# Patient Record
Sex: Female | Born: 1950 | Race: Black or African American | Hispanic: No | Marital: Married
Health system: Midwestern US, Community
[De-identification: ages and names within clinical notes are randomized; demographics above are authoritative.]

## PROBLEM LIST (undated history)

## (undated) DIAGNOSIS — D496 Neoplasm of unspecified behavior of brain: Secondary | ICD-10-CM

## (undated) DIAGNOSIS — E7800 Pure hypercholesterolemia, unspecified: Secondary | ICD-10-CM

## (undated) DIAGNOSIS — R931 Abnormal findings on diagnostic imaging of heart and coronary circulation: Secondary | ICD-10-CM

## (undated) DIAGNOSIS — J40 Bronchitis, not specified as acute or chronic: Secondary | ICD-10-CM

## (undated) DIAGNOSIS — R0989 Other specified symptoms and signs involving the circulatory and respiratory systems: Secondary | ICD-10-CM

## (undated) DIAGNOSIS — I509 Heart failure, unspecified: Secondary | ICD-10-CM

## (undated) DIAGNOSIS — H4720 Unspecified optic atrophy: Secondary | ICD-10-CM

## (undated) DIAGNOSIS — I10 Essential (primary) hypertension: Secondary | ICD-10-CM

## (undated) DIAGNOSIS — H47293 Other optic atrophy, bilateral: Secondary | ICD-10-CM

## (undated) DIAGNOSIS — K253 Acute gastric ulcer without hemorrhage or perforation: Secondary | ICD-10-CM

## (undated) DIAGNOSIS — Z1231 Encounter for screening mammogram for malignant neoplasm of breast: Secondary | ICD-10-CM

## (undated) DIAGNOSIS — D649 Anemia, unspecified: Secondary | ICD-10-CM

## (undated) DIAGNOSIS — M7122 Synovial cyst of popliteal space [Baker], left knee: Secondary | ICD-10-CM

## (undated) DIAGNOSIS — E041 Nontoxic single thyroid nodule: Secondary | ICD-10-CM

## (undated) DIAGNOSIS — R062 Wheezing: Secondary | ICD-10-CM

## (undated) DIAGNOSIS — R921 Mammographic calcification found on diagnostic imaging of breast: Secondary | ICD-10-CM

## (undated) DIAGNOSIS — R7401 Elevation of levels of liver transaminase levels: Secondary | ICD-10-CM

## (undated) DIAGNOSIS — M810 Age-related osteoporosis without current pathological fracture: Secondary | ICD-10-CM

## (undated) DIAGNOSIS — I5022 Chronic systolic (congestive) heart failure: Secondary | ICD-10-CM

## (undated) DIAGNOSIS — E042 Nontoxic multinodular goiter: Secondary | ICD-10-CM

## (undated) DIAGNOSIS — R1310 Dysphagia, unspecified: Secondary | ICD-10-CM

## (undated) DIAGNOSIS — R933 Abnormal findings on diagnostic imaging of other parts of digestive tract: Secondary | ICD-10-CM

## (undated) DIAGNOSIS — K222 Esophageal obstruction: Secondary | ICD-10-CM

## (undated) DIAGNOSIS — R448 Other symptoms and signs involving general sensations and perceptions: Secondary | ICD-10-CM

## (undated) DIAGNOSIS — K2950 Unspecified chronic gastritis without bleeding: Secondary | ICD-10-CM

## (undated) DIAGNOSIS — M79673 Pain in unspecified foot: Secondary | ICD-10-CM

## (undated) DIAGNOSIS — R059 Cough, unspecified: Secondary | ICD-10-CM

## (undated) HISTORY — PX: CHOLECYSTECTOMY: SHX55

## (undated) MED ORDER — PHENYTOIN SODIUM EXTENDED 100 MG CAP
100 mg | ORAL_CAPSULE | Freq: Three times a day (TID) | ORAL | Status: DC
Start: ? — End: 2012-01-12

## (undated) MED ORDER — CRESTOR 5 MG TABLET
5 mg | ORAL_TABLET | ORAL | Status: DC
Start: ? — End: 2013-07-29

## (undated) MED ORDER — CARVEDILOL 12.5 MG TAB
12.5 mg | ORAL_TABLET | ORAL | Status: DC
Start: ? — End: 2013-04-11

## (undated) MED ORDER — CARVEDILOL 12.5 MG TAB
12.5 mg | ORAL_TABLET | ORAL | Status: DC
Start: ? — End: 2012-09-19

## (undated) MED ORDER — PHENYTOIN SODIUM EXTENDED 100 MG CAP
100 mg | ORAL_CAPSULE | ORAL | Status: DC
Start: ? — End: 2012-12-13

## (undated) MED ORDER — ZOLPIDEM 5 MG TAB
5 mg | ORAL_TABLET | Freq: Every evening | ORAL | Status: DC | PRN
Start: ? — End: 2012-01-19

## (undated) MED ORDER — CRESTOR 5 MG TABLET
5 mg | ORAL_TABLET | ORAL | Status: DC
Start: ? — End: 2012-12-27

## (undated) MED ORDER — TRIMETHOPRIM-SULFAMETHOXAZOLE 160 MG-800 MG TAB
160-800 mg | ORAL_TABLET | Freq: Two times a day (BID) | ORAL | Status: AC
Start: ? — End: 2011-11-22

## (undated) MED ORDER — HYDROCODONE 10 MG-CHLORPHENIRAMINE 8 MG/5 ML ORAL SUSP EXTEND.REL 12HR
10-8 mg/5 mL | Freq: Two times a day (BID) | ORAL | Status: DC | PRN
Start: ? — End: 2012-06-10

## (undated) MED ORDER — CODEINE-GUAIFENESIN 10 MG-100 MG/5 ML ORAL LIQUID
100-10 mg/5 mL | Freq: Four times a day (QID) | ORAL | Status: DC | PRN
Start: ? — End: 2012-04-01

## (undated) MED ORDER — MONTELUKAST 10 MG TAB
10 mg | ORAL_TABLET | Freq: Every evening | ORAL | Status: DC
Start: ? — End: 2013-04-03

## (undated) MED ORDER — CRESTOR 5 MG TABLET
5 mg | ORAL_TABLET | ORAL | Status: DC
Start: ? — End: 2012-09-19

## (undated) MED ORDER — HYDROCODONE-ACETAMINOPHEN 7.5 MG-325 MG TAB
ORAL_TABLET | Freq: Four times a day (QID) | ORAL | Status: DC | PRN
Start: ? — End: 2013-01-14

## (undated) MED ORDER — HYDROCODONE-ACETAMINOPHEN 7.5 MG-325 MG TAB
ORAL_TABLET | Freq: Four times a day (QID) | ORAL | Status: DC | PRN
Start: ? — End: 2012-01-19

## (undated) MED ORDER — AZITHROMYCIN 250 MG TAB
250 mg | ORAL_TABLET | ORAL | Status: DC
Start: ? — End: 2012-06-10

## (undated) MED ORDER — DEXAMETHASONE 2 MG TAB
2 mg | ORAL_TABLET | ORAL | Status: DC
Start: ? — End: 2011-12-12

## (undated) MED ORDER — ZOLPIDEM 5 MG TAB
5 mg | ORAL_TABLET | ORAL | Status: DC
Start: ? — End: 2013-09-02

## (undated) MED ORDER — HYDROCODONE-ACETAMINOPHEN 7.5 MG-325 MG TAB
ORAL_TABLET | Freq: Four times a day (QID) | ORAL | Status: DC | PRN
Start: ? — End: 2012-03-05

## (undated) MED ORDER — FUROSEMIDE 40 MG TAB
40 mg | ORAL_TABLET | Freq: Every day | ORAL | Status: DC
Start: ? — End: 2013-04-11

## (undated) MED ORDER — ZOLPIDEM 5 MG TAB
5 mg | ORAL_TABLET | ORAL | Status: DC
Start: ? — End: 2013-01-25

## (undated) MED ORDER — FLUTICASONE 50 MCG/ACTUATION NASAL SPRAY, SUSP
50 mcg/actuation | Freq: Every day | NASAL | Status: DC
Start: ? — End: 2013-11-08

## (undated) MED ORDER — ZOLPIDEM 5 MG TAB
5 mg | ORAL_TABLET | Freq: Every evening | ORAL | Status: DC | PRN
Start: ? — End: 2012-07-03

## (undated) MED ORDER — TIZANIDINE 4 MG TAB
4 mg | ORAL_TABLET | Freq: Two times a day (BID) | ORAL | Status: DC
Start: ? — End: 2012-12-13

## (undated) MED ORDER — TRAMADOL 50 MG TAB
50 mg | ORAL_TABLET | Freq: Four times a day (QID) | ORAL | Status: DC | PRN
Start: ? — End: 2011-12-12

---

## 2004-02-26 LAB — HM MAMMOGRAPHY

## 2006-09-25 LAB — HM PAP SMEAR

## 2008-01-27 LAB — CBC WITH AUTOMATED DIFF
ABS. EOSINOPHILS: 0 10*3/uL (ref 0.0–0.4)
ABS. LYMPHOCYTES: 1.3 10*3/uL (ref 0.8–3.5)
ABS. MONOCYTES: 0.4 10*3/uL (ref 0–1.0)
ABS. NEUTROPHILS: 2.3 10*3/uL (ref 1.8–8.0)
BASOPHILS: 0 % (ref 0–3)
EOSINOPHILS: 1 % (ref 0–5)
HCT: 37.6 % (ref 36.0–46.0)
HGB: 12.9 g/dL (ref 12.0–16.0)
LYMPHOCYTES: 33 % (ref 20–51)
MCH: 31.9 PG (ref 25.0–35.0)
MCHC: 34.2 g/dL (ref 31.0–37.0)
MCV: 93.2 FL (ref 78.0–102.0)
MONOCYTES: 9 % (ref 2–9)
MPV: 9.8 FL (ref 7.4–10.4)
NEUTROPHILS: 57 % (ref 42–75)
PLATELET: 228 10*3/uL (ref 130–400)
RBC: 4.03 M/uL — ABNORMAL LOW (ref 4.10–5.10)
RDW: 13.6 % (ref 11.5–14.5)
WBC: 4 10*3/uL — ABNORMAL LOW (ref 4.5–13.0)

## 2008-01-27 LAB — METABOLIC PANEL, COMPREHENSIVE
A-G Ratio: 1.1 (ref 0.8–1.7)
ALT (SGPT): 38 U/L (ref 30–65)
AST (SGOT): 11 U/L — ABNORMAL LOW (ref 15–37)
Albumin: 3.9 g/dL (ref 3.4–5.0)
Alk. phosphatase: 125 U/L (ref 50–136)
Anion gap: 6 mmol/L (ref 5–15)
BUN/Creatinine ratio: 27 — ABNORMAL HIGH (ref 12–20)
BUN: 19 MG/DL — ABNORMAL HIGH (ref 7–18)
Bilirubin, total: 0.4 MG/DL (ref 0.1–0.9)
CO2: 31 MMOL/L (ref 21–32)
Calcium: 9.2 MG/DL (ref 8.4–10.4)
Chloride: 106 MMOL/L (ref 100–108)
Creatinine: 0.7 MG/DL (ref 0.6–1.3)
GFR est AA: >60 mL/min/{1.73_m2} (ref >60)
GFR est non-AA: >60 mL/min/{1.73_m2} (ref >60)
Globulin: 3.4 g/dL (ref 2.0–4.0)
Glucose: 90 MG/DL (ref 74–99)
Potassium: 4.6 MMOL/L (ref 3.5–5.5)
Protein, total: 7.3 g/dL (ref 6.4–8.2)
Sodium: 143 MMOL/L (ref 136–145)

## 2008-01-27 LAB — LIPID PANEL
CHOL/HDL Ratio: 3.7 (ref 0–5.0)
Cholesterol, total: 255 MG/DL — ABNORMAL HIGH (ref 0–200)
HDL Cholesterol: 69 MG/DL — ABNORMAL HIGH (ref 40–60)
LDL, calculated: 178.4 MG/DL — ABNORMAL HIGH (ref 0–100)
Triglyceride: 38 MG/DL (ref 0–150)
VLDL, calculated: 7.6 MG/DL

## 2008-01-27 LAB — TSH 3RD GENERATION: TSH: 1.39 u[IU]/mL (ref 0.51–6.27)

## 2008-01-27 LAB — T3, FREE: Triiodothyronine (T3), free: 3.3 PG/ML (ref 2.3–4.2)

## 2008-01-27 LAB — T4, FREE: T4, Free: 0.8 NG/DL — ABNORMAL LOW (ref 0.89–1.76)

## 2008-04-26 LAB — TSH 3RD GENERATION: TSH: 1.78 u[IU]/mL (ref 0.51–6.27)

## 2008-04-26 LAB — T4, FREE: T4, Free: 0.8 NG/DL — ABNORMAL LOW (ref 0.89–1.76)

## 2008-04-27 LAB — THYROID PEROXIDASE (TPO) AB: Thyroid peroxidase Ab: 0.5 IU/mL (ref 0.0–3.9)

## 2008-05-13 LAB — LIPID PANEL
CHOL/HDL Ratio: 4.3 (ref 0–5.0)
Cholesterol, total: 248 MG/DL — ABNORMAL HIGH (ref 0–200)
HDL Cholesterol: 58 MG/DL (ref 40–60)
LDL, calculated: 176 MG/DL — ABNORMAL HIGH (ref 0–100)
Triglyceride: 70 MG/DL (ref 0–150)
VLDL, calculated: 14 MG/DL

## 2008-05-13 LAB — CBC WITH AUTOMATED DIFF
ABS. EOSINOPHILS: 0 10*3/uL (ref 0.0–0.4)
ABS. LYMPHOCYTES: 1.5 10*3/uL (ref 0.8–3.5)
ABS. MONOCYTES: 0.4 10*3/uL (ref 0–1.0)
ABS. NEUTROPHILS: 2.1 10*3/uL (ref 1.8–8.0)
BASOPHILS: 1 % (ref 0–3)
EOSINOPHILS: 1 % (ref 0–5)
HCT: 36.3 % (ref 36.0–46.0)
HGB: 12.3 g/dL (ref 12.0–16.0)
LYMPHOCYTES: 37 % (ref 20–51)
MCH: 31.4 PG (ref 25.0–35.0)
MCHC: 33.9 g/dL (ref 31.0–37.0)
MCV: 92.7 FL (ref 78.0–102.0)
MONOCYTES: 9 % (ref 2–9)
MPV: 9.4 FL (ref 7.4–10.4)
NEUTROPHILS: 52 % (ref 42–75)
PLATELET: 223 10*3/uL (ref 130–400)
RBC: 3.92 M/uL — ABNORMAL LOW (ref 4.10–5.10)
RDW: 14.2 % (ref 11.5–14.5)
WBC: 4.1 10*3/uL — ABNORMAL LOW (ref 4.5–13.0)

## 2008-05-13 LAB — METABOLIC PANEL, COMPREHENSIVE
A-G Ratio: 1.2 (ref 0.8–1.7)
ALT (SGPT): 36 U/L (ref 30–65)
AST (SGOT): 11 U/L — ABNORMAL LOW (ref 15–37)
Albumin: 3.9 g/dL (ref 3.4–5.0)
Alk. phosphatase: 123 U/L (ref 50–136)
Anion gap: 5 mmol/L (ref 5–15)
BUN/Creatinine ratio: 20 (ref 12–20)
BUN: 14 MG/DL (ref 7–18)
Bilirubin, total: 0.5 MG/DL (ref 0.1–0.9)
CO2: 33 MMOL/L — ABNORMAL HIGH (ref 21–32)
Calcium: 8.9 MG/DL (ref 8.4–10.4)
Chloride: 106 MMOL/L (ref 100–108)
Creatinine: 0.7 MG/DL (ref 0.6–1.3)
GFR est AA: >60 mL/min/{1.73_m2} (ref >60)
GFR est non-AA: >60 mL/min/{1.73_m2} (ref >60)
Globulin: 3.3 g/dL (ref 2.0–4.0)
Glucose: 84 MG/DL (ref 74–99)
Potassium: 4.2 MMOL/L (ref 3.5–5.5)
Protein, total: 7.2 g/dL (ref 6.4–8.2)
Sodium: 144 MMOL/L (ref 136–145)

## 2008-06-05 LAB — CBC WITH AUTOMATED DIFF
ABS. EOSINOPHILS: 0 10*3/uL (ref 0.0–0.4)
ABS. LYMPHOCYTES: 1.1 10*3/uL (ref 0.8–3.5)
ABS. MONOCYTES: 0.4 10*3/uL (ref 0–1.0)
ABS. NEUTROPHILS: 2.8 10*3/uL (ref 1.8–8.0)
BASOPHILS: 1 % (ref 0–3)
EOSINOPHILS: 1 % (ref 0–5)
HCT: 38.1 % (ref 36.0–46.0)
HGB: 13.1 g/dL (ref 12.0–16.0)
LYMPHOCYTES: 26 % (ref 20–51)
MCH: 31.8 PG (ref 25.0–35.0)
MCHC: 34.4 g/dL (ref 31.0–37.0)
MCV: 92.4 FL (ref 78.0–102.0)
MONOCYTES: 10 % — ABNORMAL HIGH (ref 2–9)
MPV: 9 FL (ref 7.4–10.4)
NEUTROPHILS: 62 % (ref 42–75)
PLATELET: 223 10*3/uL (ref 130–400)
RBC: 4.12 M/uL (ref 4.10–5.10)
RDW: 14.2 % (ref 11.5–14.5)
WBC: 4.4 10*3/uL — ABNORMAL LOW (ref 4.5–13.0)

## 2008-06-05 LAB — CKMB PROFILE
CK - MB: <0.5 ng/ml — ABNORMAL LOW (ref 0.5–3.6)
CK: 34 U/L (ref 21–215)

## 2008-06-05 LAB — TROPONIN I: Troponin-I, QT: <0.04 NG/ML (ref 0.00–0.08)

## 2008-06-05 LAB — METABOLIC PANEL, BASIC
Anion gap: 4 mmol/L — ABNORMAL LOW (ref 5–15)
BUN/Creatinine ratio: 19 (ref 12–20)
BUN: 13 MG/DL (ref 7–18)
CO2: 30 MMOL/L (ref 21–32)
Calcium: 9.2 MG/DL (ref 8.4–10.4)
Chloride: 105 MMOL/L (ref 100–108)
Creatinine: 0.7 MG/DL (ref 0.6–1.3)
GFR est AA: >60 mL/min/{1.73_m2} (ref >60)
GFR est non-AA: >60 mL/min/{1.73_m2} (ref >60)
Glucose: 85 MG/DL (ref 74–99)
Potassium: 3.9 MMOL/L (ref 3.5–5.5)
Sodium: 139 MMOL/L (ref 136–145)

## 2008-06-05 LAB — POC TROPONIN-I: Troponin-I (POC): <0.04 ng/mL (ref 0.00–0.08)

## 2008-06-05 LAB — NT-PRO BNP: NT pro-BNP: 678 PG/ML (ref 0–900)

## 2008-06-07 LAB — URINALYSIS W/ RFLX MICROSCOPIC
Bilirubin: NEGATIVE
Glucose: NEGATIVE MG/DL
Ketone: NEGATIVE MG/DL
Leukocyte Esterase: NEGATIVE
Nitrites: NEGATIVE
Protein: NEGATIVE MG/DL
Specific gravity: 1.015 (ref 1.003–1.030)
Urobilinogen: 0.2 EU/DL (ref 0.2–1.0)
pH (UA): 6.5 (ref 5.0–8.0)

## 2008-06-07 LAB — METABOLIC PANEL, BASIC
Anion gap: 5 mmol/L (ref 5–15)
BUN/Creatinine ratio: 21 — ABNORMAL HIGH (ref 12–20)
BUN: 15 MG/DL (ref 7–18)
CO2: 33 MMOL/L — ABNORMAL HIGH (ref 21–32)
Calcium: 9.2 MG/DL (ref 8.4–10.4)
Chloride: 106 MMOL/L (ref 100–108)
Creatinine: 0.7 MG/DL (ref 0.6–1.3)
GFR est AA: >60 mL/min/{1.73_m2} (ref >60)
GFR est non-AA: >60 mL/min/{1.73_m2} (ref >60)
Glucose: 85 MG/DL (ref 74–99)
Potassium: 4.5 MMOL/L (ref 3.5–5.5)
Sodium: 144 MMOL/L (ref 136–145)

## 2008-06-07 LAB — FACTOR V LEIDEN

## 2008-06-07 LAB — LIPID PANEL
CHOL/HDL Ratio: 3.5 (ref 0–5.0)
Cholesterol, total: 196 MG/DL (ref 0–200)
HDL Cholesterol: 56 MG/DL (ref 40–60)
LDL, calculated: 130.6 MG/DL — ABNORMAL HIGH (ref 0–100)
Triglyceride: 47 MG/DL (ref 0–150)
VLDL, calculated: 9.4 MG/DL

## 2008-06-07 LAB — CBC WITH AUTOMATED DIFF
ABS. EOSINOPHILS: 0 10*3/uL (ref 0.0–0.4)
ABS. LYMPHOCYTES: 1.1 10*3/uL (ref 0.8–3.5)
ABS. MONOCYTES: 0.3 10*3/uL (ref 0–1.0)
ABS. NEUTROPHILS: 2.1 10*3/uL (ref 1.8–8.0)
BASOPHILS: 1 % (ref 0–3)
EOSINOPHILS: 1 % (ref 0–5)
HCT: 37.4 % (ref 36.0–46.0)
HGB: 12.8 g/dL (ref 12.0–16.0)
LYMPHOCYTES: 32 % (ref 20–51)
MCH: 31.6 PG (ref 25.0–35.0)
MCHC: 34.2 g/dL (ref 31.0–37.0)
MCV: 92.3 FL (ref 78.0–102.0)
MONOCYTES: 8 % (ref 2–9)
MPV: 9.6 FL (ref 7.4–10.4)
NEUTROPHILS: 58 % (ref 42–75)
PLATELET: 237 10*3/uL (ref 130–400)
RBC: 4.06 M/uL — ABNORMAL LOW (ref 4.10–5.10)
RDW: 14.4 % (ref 11.5–14.5)
WBC: 3.6 10*3/uL — ABNORMAL LOW (ref 4.5–13.0)

## 2008-06-07 LAB — CK: CK: 30 U/L (ref 21–215)

## 2008-06-07 LAB — PTT: aPTT: 32.4 s (ref 24.6–37.7)

## 2008-06-07 LAB — HEPATIC FUNCTION PANEL
A-G Ratio: 1.3 (ref 0.8–1.7)
ALT (SGPT): 38 U/L (ref 30–65)
AST (SGOT): 10 U/L — ABNORMAL LOW (ref 15–37)
Albumin: 4 g/dL (ref 3.4–5.0)
Alk. phosphatase: 138 U/L — ABNORMAL HIGH (ref 50–136)
Bilirubin, direct: 0.1 MG/DL (ref 0.0–0.3)
Bilirubin, total: 0.6 MG/DL (ref 0.1–0.9)
Globulin: 3.2 g/dL (ref 2.0–4.0)
Protein, total: 7.2 g/dL (ref 6.4–8.2)

## 2008-06-07 LAB — TSH 3RD GENERATION: TSH: 0.66 u[IU]/mL (ref 0.51–6.27)

## 2008-06-07 LAB — PROTHROMBIN TIME + INR
INR: 1 (ref 0.0–1.2)
Prothrombin time: 13.3 SECS (ref 11.5–15.2)

## 2008-06-07 LAB — URINE MICROSCOPIC ONLY
Hyaline cast: 0 (ref 0–2)
RBC: 4 /HPF (ref 0–5)
WBC: 0 /HPF (ref 0–4)

## 2008-06-08 LAB — SED RATE (ESR): Sed rate (ESR): 30 MM/HR (ref 0–30)

## 2008-06-09 LAB — CRP, HIGH SENSITIVITY: CRP, High sensitivity: 0.77 mg/L (ref <=3.00)

## 2008-06-12 LAB — FACTOR V LEIDEN
FACTOR V LEIDEN,FVL: NEGATIVE
Factor V Leiden-PCR: NEGATIVE

## 2008-06-23 DIAGNOSIS — E039 Hypothyroidism, unspecified: Secondary | ICD-10-CM | POA: Insufficient documentation

## 2008-07-19 MED ORDER — FLUTICASONE 50 MCG/ACTUATION NASAL SPRAY, SUSP
50 mcg/actuation | Freq: Every day | NASAL | Status: DC
Start: 2008-07-19 — End: 2009-06-06

## 2008-07-19 NOTE — Progress Notes (Signed)
HISTORY OF PRESENT ILLNESS  Cathy [name] is a 58 y.o. female.  HPI  Seen by Dr [name].  Has been dx with gallstones.  Was treated with meds.  Has been advised to see a Careers adviser.  Pt has not gone yet, wanted to discuss it further with PCP.  No longer having abdominal pain.    Pt admits that she gets easily fatigued with exertion lately.  Saw cards, was told that no new problems existed.  Was told to hold lisinopril and coreg for one week.  Had elevation of bp so coreg was resumed.    Pt reports that her husband complains that she is not breathing normally while sleeping.  Has a "rattling" noise that he hears while she is asleep.  Does not think that she is snoring.  Pt does not think that she stops breathing although he does wake her up sometimes to see if she is ok.  Pt hears it as well when she first gets up in the am.  Resolves after she has been up for a while.    ROS    Physical Exam    ASSESSMENT and PLAN  Cathy [name] was seen today for labs, abdominal pain and breathing problem.    Diagnoses and associated orders for this visit:    Gallstones  rec pt see surg for further eval.    Htn (hypertension)  Stable.    Allergic rhinitis, cause unspecified  - fluticasone (FLONASE) 50 mcg/act. nasal spray; 2 Sprays by Nasal route daily.    Noisy breathing at night:  Refer to pulm for eval    History of elevated lipids  Cont zocor.      Follow-up Disposition:  Return in about 1 month (around 08/19/2008).

## 2008-07-19 NOTE — Progress Notes (Signed)
Patient here to review her labs, abdominal pain due to gallstones, breathing problem at night during sleep sounds like rattling,

## 2008-07-19 NOTE — Patient Instructions (Signed)
Contact a surgeon for evaluation of gallstones.  Contact a pulmonologist for pulmonary function testing to evaluate fatigue/ shortness of breath with exertion.

## 2008-07-26 NOTE — Telephone Encounter (Signed)
Discussed with Patient some mild abnormalities on blood work and urine analysis (alkaline phosphatase and blood in urine) . She has a follow up appointment with Dr. Luiz Blare and will forward message to Dr. Luiz Blare to discuss those labs with patient.

## 2008-08-23 LAB — AMB POC URINALYSIS DIP STICK MANUAL W/O MICRO
Bilirubin (UA POC): NEGATIVE
Glucose (UA POC): NEGATIVE
Ketones (UA POC): NEGATIVE
Leukocyte esterase (UA POC): NEGATIVE
Nitrites (UA POC): NEGATIVE
Specific gravity (UA POC): 1.02 (ref 1.001–1.035)
Urobilinogen: 0.2
pH (UA POC): 7 (ref 4.6–8.0)

## 2008-08-23 NOTE — Patient Instructions (Signed)
Please remember to fast for your labs.  Finish your dinner by 8pm.  Feel free to drink as much water as you would like.  It is also ok to take any vitamins or medications you would normally take.  Do your bloodwork as early as possible the next morning before having any coffee, tea, juice, or food.

## 2008-08-23 NOTE — Progress Notes (Signed)
HISTORY OF PRESENT ILLNESS  Cathy [name] is a 58 y.o. female.  HPI  Saw pulmonologist about noisy breathing at night.  A breathing test was done in the office.  Was told that the result was normal.  Told that she is tired because she is not sleeping well due to altered breathing at night.  Has sleep study pending in July at [facility].  Husband has noted that she is less noisy when she uses flonase before bedtime.        Review of Systems   Constitutional: Negative.    HENT: Negative.    Respiratory: Negative.    Cardiovascular: Negative.    Musculoskeletal: Negative.    Neurological: Negative.    Psychiatric/Behavioral: Negative.        Physical Exam   Constitutional: She is oriented to person, place, and time. She appears well-developed and well-nourished.   HENT:   Head: Normocephalic and atraumatic.   Right Ear: External ear normal.   Left Ear: External ear normal.   Nose: Nose normal.   Eyes: Conjunctivae and extraocular motions are normal.   Neck: Trachea normal and normal range of motion. Neck supple. Carotid bruit is not present.   Cardiovascular: Normal rate, regular rhythm, normal heart sounds and normal pulses.    Pulmonary/Chest: Effort normal and breath sounds normal.   Musculoskeletal: Normal range of motion.   Neurological: She is alert and oriented to person, place, and time. Coordination and gait normal.   Skin: Skin is warm, dry and intact.   Psychiatric: She has a normal mood and affect. Her speech is normal and behavior is normal. Judgment and thought content normal. Cognition and memory are normal.       ASSESSMENT and PLAN  Cathy [name] was seen today for hypertension, cholesterol problem and heart problem.    Diagnoses and associated orders for this visit:    Hematuria, microscopic  - AMB POC URINALYSIS DIP STICK MANUAL W/O MICRO  - CULTURE, URINE; Future  Pt notified of persistent positive result.  No sign uti.  Will await UCx results.  If wnl, refer to uro for workup.                  Htn (hypertension)  - METABOLIC PANEL, COMPREHENSIVE; Future    History of elevated lipids  - LIPID PANEL; Future        Follow-up Disposition:  Return in about 3 months (around 11/23/2008) for follow up bp, cholesterol, labs.

## 2008-08-23 NOTE — Progress Notes (Signed)
Pt is here for f/u with labs

## 2008-08-25 LAB — CULTURE, URINE
Culture result:: NO GROWTH
Culture: NO GROWTH

## 2008-09-29 NOTE — Telephone Encounter (Signed)
09/29/08 patient states she notices blood in her vagina when she wipes after urinating and notices a dark orange color to her urine in the toilet bowl.  Per Dr. Luiz Blare directions the patient will be set up as soon as possible to see Dr. Clent Ridges Uro/GYN and the patient understands I will get back with her as soon as I set the appointment up with Dr. Clent Ridges.

## 2008-09-29 NOTE — Telephone Encounter (Signed)
Message copied by Christen Butter on Fri Sep 29, 2008 11:49 AM  ------       Message from: Marice Potter       Created: Fri Sep 29, 2008 10:16 AM       Regarding: graves       Contact: [phone]         Cathy [name] would like you to return her call.  She said you called her and said you were going to send her somewhere for blood in her urine, but never got back in touch with her.  She is starting to see it again and would like you to please return her call.

## 2008-09-29 NOTE — Telephone Encounter (Addendum)
09/29/08 patient informed per Dr. Luiz Blare that Dr. Rushie Nyhan URO/GYN at [phone]  Fax# [phone] their office will contact our patient by closing Monday, 10/02/08 to make an appointment to see Dr. Jennye Boroughs will call us back later if notes are needed).  Patient states she understands all.

## 2008-11-01 NOTE — Procedures (Signed)
SLEEP DISORDER CENTER   [facility] POLYSOMNOGRAM REPORT   [address] [place], [place]   [address]   Telephone [phone] Fax [phone]     SLEEP DISORDER CENTER   POLYSOMNOGRAM REPORT    PATIENT: Cathy [name], Cathy [name]  MRN: [id number] DATE: 10/19/2008  BILLING: [id number] LOCATION:  REFERRING:  DICTATING: Zollie Pee, MD      STUDY TYPE: CPAP TITRATION STUDY    REFERRING PHYSICIAN: Mellody Drown, MD    INTERPRETING PHYSICIAN: Zollie Pee, MD    STUDY START TIME: 2310 p.m.    STUDY END TIME: 0611 a.m.    CLINICAL INFORMATION: This is a 58 year old female who had obstructive  sleep apnea syndrome, excessive daytime sleepiness and unrefreshed sleep.  The patient is 67 inches tall and weighs 205 pounds.    STUDY QUESTIONNAIRE: According to her own estimate, the patient took 10  minutes to fall asleep. Slept for 7 hours, woke up 3 times and remained  awake for 3 minutes during the whole study. She felt she got too little  sleep and sleep was very little refreshing at the end of the study. Patient  had no caffeinated or alcoholic beverages or her medications prior to the  study.    STUDY PROTOCOL: This study consisted of overnight recording of 14 channels  including left and right EOG; EEG; submental and extremity EMG; EKG;  airflow measurements, respiratory effort and oximetry.    STUDY RESULTS: During the CPAP titration electrophysiological measures  show total recording time (TRT) 421 minutes, total sleep time (TST) 395  minutes, with normal sleep efficiency of 94% and slightly low sleep latency  (SL) of 8 minutes. The patient remained wakeful after sleep onset for 19  minutes. She spent in stage N1 5%, stage N2 35%, stage N3 19% and stage REM  41% of total sleep time. There were 4 REM periods. REM latency was seen at  63 minutes with REM supine seen for 7 minutes. There were a total of 92  arousals. Most of them were spontaneous with arousal index per hour of 14.   The patient spent sleep on her back as well as right and left lateral  positions and briefly on her abdomen. The patient has somewhat good sleep  architecture.    Respiratory measures show that the patient manifested 1 obstructive central  apnea, and 35 obstructive hypopnea, with apnea-hypopnea index (AHI) of 6.  There were 3 RERA counts with respiratory disturbance count (RDI) of 7. The  lowest oxygen saturation of oxygen was 91% with 1 of the respiratory  events. The patient spent no recorded time below 88% of oxygen saturation.  There was not any period breathing, hypoventilation or hyperventilation  periods. She has no positional or REM association to the respiratory  events. The patient snoring was eliminated at final CPAP pressure. .    CPAP was applied and started at 5 cm H2O. It was titrated up to eliminate  the respiratory events and hypoxemia. At the final pressure of 16 tolerated  by the patient, central apnea emerged. At the pressure of 15 cwp, the  patient had REM and REM supine briefly. Sleep efficiency was good, 93%.  Central apnea started to appear at this pressure. Lowest oxygen saturation  was 93%. Leaks were tolerable. Total AHI at pressure of 15 cwp was 4,  accounting for central apneas.    EMG channels shows that there were a total of 34 periodic limb movements  out of 107 limb movements with  periodic limb movement index of 5 per hour  and total limb movement index of 16 per hour. EKG channel shows average  heart rate for all stages of sleep was 77 beats per minute without any  occurrence of any arrhythmia.    CONCLUSION  1. Successful elimination of sleep disordered breathing and obstructive  sleep apnea syndrome with CPAP treatment.  2. Final CPAP pressure of 15 cwp.  3. No Cheyne-Stoke breathing seen on tracing, despite reported by sleep  technician.    RECOMMENDATIONS  1. CPAP treatment with 15 cwp with humidifier.  2. Mask used was [employer] size.  3. Weight reduction.   4. Followup of the symptoms and repeat study if symptoms worsened or  persist.    ADJUNCTIVE MEASURES  1. The patient should be encouraged to CPAP treatment compliance, weight  reduction  2. Please discuss sleep hygiene.  3. Avoid sedatives, hypnotics, and alcohol at bedtime.  4. Prudent driving practices should be stressed.    Thank you very much for referring this patient for sleep study.                     Electronically Signed   Zollie Pee, MD 11/02/2008 18:34   Zollie Pee, MD    APA:WMX  D: 11/01/2008 T: 11/01/2008 6:11 P  CQDocID #: [id number] CScriptDoc #:  [id number]  cc: Tommie Raymond, MD

## 2008-11-01 NOTE — Procedures (Signed)
Procedures signed by  at 11/02/08  [id number]                 Author: Provider, Historical  Service: --  Author Type: Physician            Filed:   Date of Service: 11/01/08 1811  Status: Signed            Procedure Orders        1. POLYSOMNOGRAPHY 1 NIGHT [[id number]] ordered by  at 11/01/08 1811                         <!--EPICS-->                                                   SLEEP DISORDER CENTER<BR>            [facility]                POLYSOMNOGRAM  REPORT<BR>  [address],     [place], [place]<BR>                     [address]<BR>   Telephone [phone]   Fax [phone]<BR> <BR>             SLEEP DISORDER CENTER<BR>             POLYSOMNOGRAM REPORT<BR> <BR> PATIENT:    Cathy [name], Cathy  [name]<BR> MRN:            [id number]          DATE:       10/19/2008<BR> BILLING:        [id number]         LOCATION:<BR> REFERRING:<BR> DICTATING:  AMIT PATEL, MD<BR> <BR> <BR> STUDY TYPE:  CPAP TITRATION STUDY<BR> <BR> REFERRING PHYSICIAN:  Mellody Drown,  MD<BR> <BR> INTERPRETING PHYSICIAN:  Zollie Pee, MD<BR> <BR> STUDY START TIME:  2310 p.m.<BR> <BR> STUDY END TIME:  0611 a.m.<BR> <BR> CLINICAL INFORMATION:  This is a 58 year old female who had obstructive<BR> sleep apnea syndrome, excessive daytime  sleepiness and unrefreshed sleep.<BR> The patient is 67 inches tall and weighs 205 pounds.<BR> <BR> STUDY QUESTIONNAIRE:  According to her own estimate, the patient took 10<BR> minutes to fall asleep. Slept for 7 hours, woke up 3 times and remained<BR>  awake for 3 minutes during the whole study. She felt she got too little<BR> sleep and sleep was very little refreshing at the end of the study. Patient<BR> had no caffeinated or alcoholic beverages or her medications prior to the<BR> study.<BR> <BR> STUDY  PROTOCOL:  This study consisted of overnight recording of 14 channels<BR> including left and right EOG; EEG; submental and extremity EMG; EKG;<BR> airflow measurements, respiratory effort and  oximetry.<BR> <BR> STUDY RESULTS:  During the CPAP titration  electrophysiological measures<BR> show total recording time (TRT) 421 minutes, total sleep time (TST) 395<BR> minutes, with normal sleep efficiency of 94% and slightly low sleep latency<BR> (SL) of 8 minutes. The patient remained wakeful after sleep onset  for 19<BR> minutes. She spent in stage N1 5%, stage N2 35%, stage N3 19% and stage REM<BR> 41% of total sleep time. There were 4 REM periods. REM latency was seen at<BR> 63 minutes with REM supine seen for 7 minutes. There were a total of 92<BR> arousals.  Most of them were  spontaneous with arousal index per hour of 14.<BR> The patient spent sleep on her back as well as right and left lateral<BR> positions and briefly on her abdomen. The patient has somewhat good sleep<BR> architecture.<BR> <BR> Respiratory  measures show that the patient manifested 1 obstructive central<BR> apnea, and 35 obstructive hypopnea, with apnea-hypopnea index (AHI) of 6.<BR> There were 3 RERA counts with respiratory disturbance count (RDI) of 7. The<BR> lowest oxygen saturation  of oxygen was 91% with 1 of the respiratory<BR> events. The patient spent no recorded time below 88% of oxygen saturation.<BR> There was not any period breathing, hypoventilation or hyperventilation<BR> periods. She has no positional or REM association  to the respiratory<BR> events. The patient snoring was eliminated at final CPAP pressure. .<BR> <BR> CPAP was applied and started at 5 cm H2O. It was titrated up to eliminate<BR> the respiratory events and hypoxemia. At the final pressure of 16 tolerated<BR>  by the patient, central apnea emerged. At the pressure of 15 cwp, the<BR> patient had REM and REM supine briefly. Sleep efficiency was good, 93%.<BR> Central apnea started to appear at this pressure. Lowest oxygen saturation<BR> was 93%. Leaks were tolerable.  Total AHI at pressure of 15 cwp was 4,<BR> accounting for central apneas.<BR> <BR> EMG  channels shows that there were a total of 34 periodic limb movements<BR> out of 107 limb movements with periodic limb movement index of 5 per hour<BR> and total limb  movement index of 16 per hour. EKG channel shows average<BR> heart rate for all stages of sleep was 77 beats per minute without any<BR> occurrence of any arrhythmia.<BR> <BR> CONCLUSION<BR> 1. Successful elimination of sleep disordered breathing and obstructive<BR>  sleep apnea syndrome with CPAP treatment.<BR> 2. Final CPAP pressure of 15 cwp.<BR> 3. No Cheyne-Stoke breathing seen on tracing, despite reported by sleep<BR> technician.<BR> <BR> RECOMMENDATIONS<BR> 1. CPAP treatment with 15 cwp with humidifier.<BR>  2. Mask used was [employer] small size.<BR> 3. Weight reduction.<BR> 4. Followup of the symptoms and repeat study if symptoms worsened or<BR> persist.<BR> <BR> ADJUNCTIVE MEASURES<BR> 1. The patient should be encouraged to CPAP treatment compliance,  weight<BR> reduction<BR> 2. Please discuss sleep hygiene.<BR> 3. Avoid sedatives, hypnotics, and alcohol at bedtime.<BR> 4. Prudent driving practices should be stressed.<BR> <BR> Thank you very much for referring this patient for sleep study.<BR> <BR>  <BR> <BR> <BR> <BR> <BR> <BR> <BR> <BR>       Electronically Signed<BR>       AMIT PATEL, MD 11/02/2008 18:34<BR>                                     AMIT PATEL, MD<BR> <BR> APA:WMX<BR> D: 11/01/2008 T: 11/01/2008  6:11 P<BR> CQDocID #: [id number]  CScriptDoc  #:<BR> [id number]<BR> cc:   Tommie Raymond, MD<BR> <!--EPICE-->

## 2008-11-08 NOTE — Telephone Encounter (Signed)
Message copied by Christen Butter on Wed Nov 08, 2008 12:15 PM  ------       Message from: Marice Potter       Created: Wed Nov 08, 2008 10:15 AM       Regarding: graves       Contact: [phone]         Cathy [name] was in the hospital over the weekend and she needs the name of the surgeon for her galbladder surgery that dr graves wants her to go see.

## 2008-11-08 NOTE — Telephone Encounter (Signed)
11/08/08 patient called and states she had to go to the [facility] ER on 11/05/08 with admission due to pain re: gallstones.  Patient wanted to get from Dr.Graves a listing of doctors to see someone for her gallstones.  However, while in the [facility] ER/admission doctors told the patient she needs to see Dr. Hedwig Morton.  Patient was willing to come in to follow up with Dr. Luiz Blare from the ER admission on 11/05/08 and will come into our office to see Dr. Luiz Blare on Monday, 11/13/08 at 10:00 a.m.  Patient states she is doing much better since being released from [facility] and will be okay to wait until 11/13/08 to follow up with Dr. Luiz Blare.

## 2008-11-13 MED ORDER — ZOLPIDEM 10 MG TAB
10 mg | ORAL_TABLET | Freq: Every evening | ORAL | Status: DC | PRN
Start: 2008-11-13 — End: 2008-12-13

## 2008-11-13 MED ORDER — PROMETHAZINE 12.5 MG TAB
12.5 mg | ORAL_TABLET | Freq: Four times a day (QID) | ORAL | Status: DC | PRN
Start: 2008-11-13 — End: 2009-06-06

## 2008-11-13 NOTE — Progress Notes (Signed)
Chief Complaint   Patient presents with   ??? Hospital Follow Up     for chest pain     was taken off Lisinopril in hospital  HISTORY OF PRESENT ILLNESS  Cathy [name] is a 58 y.o. female.  HPI  Admitted to [facility] on 11/05/08 with 3 day hx of chest pain, nausea, and weakness.  MI was ruled out.  Was told by ER doc that she had CHF.  Was diuresed but had hypotension.  As a result of this, had a syncopal episode in the bathroom during her admission.  Has been taken off of lisinopril as a result of the hypotensive episode.  Had stress testing with cards in July 2010.  Has a f/u appt with cards in 10/10.      Since d/c on 11/07/08, still having weakness and chest pain.  Was told that she needs to have her gall bladder removed a few months ago.  Has made an appt for that.  Has less nausea.      Has seen the pulmonologist.  Had a sleep study.  Dx with severe osa.  Has had titration study also, but hasn't gotten the results yet.  Had been taking ambien to help her sleep at night.    Review of Systems   Constitutional: Positive for malaise/fatigue.   HENT: Negative.    Respiratory: Negative for shortness of breath.    Cardiovascular: Positive for chest pain. Negative for palpitations and leg swelling.   Gastrointestinal: Positive for nausea.   Musculoskeletal: Negative.    Neurological: Negative.    Psychiatric/Behavioral: Negative.        Physical Exam   Nursing note and vitals reviewed.  Constitutional: She is oriented to person, place, and time. She appears well-developed and well-nourished.   HENT:   Head: Normocephalic and atraumatic.   Right Ear: External ear normal.   Left Ear: External ear normal.   Nose: Nose normal.   Eyes: Conjunctivae and extraocular motions are normal.   Neck: Normal range of motion. Neck supple. No JVD present. Carotid bruit is not present. No thyromegaly present.    Cardiovascular: Normal rate, regular rhythm, normal heart sounds and intact distal pulses.  Exam reveals no gallop and no friction rub.    No murmur heard.  Pulmonary/Chest: Effort normal and breath sounds normal. No respiratory distress. She has no wheezes. She has no rales.   Abdominal: Soft. Bowel sounds are normal.   Musculoskeletal: Normal range of motion.   Neurological: She is alert and oriented to person, place, and time. Coordination normal.   Skin: Skin is warm and dry.   Psychiatric: She has a normal mood and affect. Her behavior is normal. Judgment and thought content normal.       ASSESSMENT and PLAN  Cathy [name] was seen today for hospital follow up.    Diagnoses and associated orders for this visit:    Chf (congestive heart failure)  Stable.  Care as per cards.  Encouraged pt to ask about cardiac rehab when she sees cards.      Osa (obstructive sleep apnea)  eval in progress.  Importance of control with regards to its effect on cardiac dz emphasized.      Nausea  - promethazine (PHENERGAN) 12.5 mg tablet; Take 1 Tab by mouth every six (6) hours as needed for Nausea.    Insomnia  - zolpidem (AMBIEN) 10 mg tablet; Take 1 Tab by mouth nightly as needed for Sleep.  Gallstones  surg eval pending.        Follow-up Disposition:  Return in about 1 month (around 12/13/2008).  Sooner prn.

## 2008-11-23 LAB — METABOLIC PANEL, COMPREHENSIVE
A-G Ratio: 1.2 (ref 0.8–1.7)
ALT (SGPT): 36 U/L (ref 30–65)
AST (SGOT): 13 U/L — ABNORMAL LOW (ref 15–37)
Albumin: 3.8 g/dL (ref 3.4–5.0)
Alk. phosphatase: 119 U/L (ref 50–136)
Anion gap: 5 mmol/L (ref 5–15)
BUN/Creatinine ratio: 18 (ref 12–20)
BUN: 14 MG/DL (ref 7–18)
Bilirubin, total: 0.3 MG/DL (ref 0.1–0.9)
CO2: 31 MMOL/L (ref 21–32)
Calcium: 9.3 MG/DL (ref 8.4–10.4)
Chloride: 107 MMOL/L (ref 100–108)
Creatinine: 0.8 MG/DL (ref 0.6–1.3)
GFR est AA: >60 mL/min/{1.73_m2} (ref >60)
GFR est non-AA: >60 mL/min/{1.73_m2} (ref >60)
Globulin: 3.1 g/dL (ref 2.0–4.0)
Glucose: 79 MG/DL (ref 74–99)
Potassium: 4.6 MMOL/L (ref 3.5–5.5)
Protein, total: 6.9 g/dL (ref 6.4–8.2)
Sodium: 143 MMOL/L (ref 136–145)

## 2008-11-23 LAB — LIPID PANEL
CHOL/HDL Ratio: 3.4 (ref 0–5.0)
Cholesterol, total: 188 MG/DL (ref 0–200)
HDL Cholesterol: 55 MG/DL (ref 40–60)
LDL, calculated: 125 MG/DL — ABNORMAL HIGH (ref 0–100)
Triglyceride: 40 MG/DL (ref 0–150)
VLDL, calculated: 8 MG/DL

## 2008-11-27 NOTE — Progress Notes (Signed)
Chief Complaint   Patient presents with   ??? Pre-op Exam     with lab review  Flu injection given IM in left arm, no reaction noted

## 2008-11-27 NOTE — Progress Notes (Signed)
Preoperative Evaluation    Date of Exam: 11/27/2008    Cathy [name] is a 58 y.o. female (DOB:1950-03-05) who presents for preoperative evaluation.   Procedure/Surgery:L cataract removal   Date of Procedure/Surgery: 11/30/08  Surgeon: Dr Marlowe Aschoff  Hospital/Surgical Facility: [facility]    Primary Physician: Clarita Crane, MD  Latex Allergy: no    Medical History:     Past Medical History   Diagnosis Date   ??? CHF (congestive heart failure) 06/23/2008   ??? 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 Fibroid 06/23/2008   ??? HTN (hypertension) 06/23/2008   ??? History of elevated lipids 06/23/2008   ??? Breast mass, left      benign   ??? OSA (obstructive sleep apnea) 9/10     on cpap       Allergies:     Allergies   Allergen Reactions   ??? Pcn (Penicillins) Itching     Itching bumps on hands and arms        Medications:     Current outpatient prescriptions   Medication Sig   ??? promethazine (PHENERGAN) 12.5 mg tablet Take 1 Tab by mouth every six (6) hours as needed for Nausea.   ??? zolpidem (AMBIEN) 10 mg tablet Take 1 Tab by mouth nightly as needed for Sleep.   ??? fluticasone (FLONASE) 50 mcg/Actuation nasal spray 2 Sprays by Nasal route daily.   ??? carvedilol (COREG) 6.25 mg tablet Take 6.25 mg by mouth daily.   ??? simvastatin (ZOCOR) 20 mg tablet Take 20 mg by mouth nightly.       Surgical History:     Past Surgical History   Procedure Date   ??? Hx tonsil and adenoidectomy    ??? Hx tubal ligation    ??? Hx total abdominal hysterectomy 1996         Recent use of: No recent use of aspirin (ASA), NSAIDS or steroids    Tetanus up to date: tetanus status unknown to the patient      Anesthesia Complications: None  History of abnormal bleeding : None  History of Blood Transfusions: no  Health Care Directive or Living Will: no    REVIEW OF SYSTEMS:  A comprehensive review of systems was negative.    EXAM:    BP 107/63   Pulse 87   Temp(Src) 97.7 ??F (36.5 ??C) (Oral)   Ht 5\' 7"  (1.702 m)   Wt 205 lb (92.987 kg)  General appearance - alert, well appearing, and in no distress and oriented to person, place, and time  Mental status - alert, oriented to person, place, and time, normal mood, behavior, speech, dress, motor activity, and thought processes, affect appropriate to mood  Eyes - pupils equal and reactive, extraocular eye movements intact  Ears - bilateral TM's and external ear canals normal  Nose - normal and patent, no erythema, discharge or polyps  Mouth - mucous membranes moist, pharynx normal without lesions  Neck - supple, no significant adenopathy, carotids upstroke normal bilaterally, no bruits  Lymphatics - no palpable lymphadenopathy, no hepatosplenomegaly  Chest - clear to auscultation, no wheezes, rales or rhonchi, symmetric air entry  Heart - normal rate, regular rhythm, normal S1, S2, no murmurs, rubs, clicks or gallops  Abdomen - soft, nontender, nondistended, no masses or organomegaly  Neurological - alert, oriented, normal speech, no focal findings or movement disorder noted, neck supple without rigidity, cranial nerves II through  XII intact, DTR's normal and symmetric, motor and sensory grossly normal bilaterally, normal muscle tone, no tremors, strength 5/5  Musculoskeletal - no joint tenderness, deformity or swelling  Extremities - peripheral pulses normal, no pedal edema, no clubbing or cyanosis  Skin - normal coloration and turgor, no rashes, no suspicious skin lesions noted      DIAGNOSTICS:   1. EKG: EKG FINDINGS - normal EKG, normal sinus rhythm  2. CXR: pending  3. Labs:   Component Value Date/Time   WBC 3.4 12/01/2008 10:39 AM   HGB 12.4 12/01/2008 10:39 AM   HCT 37.6 12/01/2008 10:39 AM   PLATELET 214 12/01/2008 10:39 AM   MCV 94.5 12/01/2008 10:39 AM         Component Value Date/Time   LDL, calculated 125 11/23/2008 11:55 AM   Creatinine 0.6 12/01/2008 10:39 AM          Component Value Date/Time    ALT 36 11/23/2008 11:55 AM   AST 13 11/23/2008 11:55 AM   Alk. phosphatase 119 11/23/2008 11:55 AM   Bilirubin, direct 0.1 06/07/2008 10:46 AM   Bilirubin, total 0.3 11/23/2008 11:55 AM          Component Value Date/Time   Creatinine 0.6 12/01/2008 10:39 AM   BUN 11 12/01/2008 10:39 AM   Sodium 140 12/01/2008 10:39 AM   Potassium 4.6 12/01/2008 10:39 AM   Chloride 105 12/01/2008 10:39 AM   CO2 29 12/01/2008 10:39 AM          Component Value Date/Time   TSH, 3rd generation 0.66 06/07/2008 10:46 AM          IMPRESSION:   Congestive heart failure  No contraindications to planned surgery    Clarita Crane, MD   11/27/2008

## 2008-12-01 LAB — CBC WITH AUTOMATED DIFF
ABS. BASOPHILS: 0 10*3/uL (ref 0.0–0.1)
ABS. EOSINOPHILS: 0 10*3/uL (ref 0.0–0.4)
ABS. LYMPHOCYTES: 1 10*3/uL (ref 0.8–3.5)
ABS. MONOCYTES: 0.3 10*3/uL (ref 0–1.0)
ABS. NEUTROPHILS: 2.1 10*3/uL (ref 1.8–8.0)
BASOPHILS: 0 % (ref 0–3)
EOSINOPHILS: 1 % (ref 0–5)
HCT: 37.6 % (ref 36.0–46.0)
HGB: 12.4 g/dL (ref 12.0–16.0)
LYMPHOCYTES: 30 % (ref 20–51)
MCH: 31.1 PG (ref 25.0–35.0)
MCHC: 32.9 g/dL (ref 31.0–37.0)
MCV: 94.5 FL (ref 78.0–102.0)
MONOCYTES: 8 % (ref 2–9)
MPV: 9.3 FL (ref 7.4–10.4)
NEUTROPHILS: 61 % (ref 42–75)
PLATELET: 214 10*3/uL (ref 130–400)
RBC: 3.98 M/uL — ABNORMAL LOW (ref 4.10–5.10)
RDW: 13.9 % (ref 11.5–14.5)
WBC: 3.4 10*3/uL — ABNORMAL LOW (ref 4.5–13.0)

## 2008-12-01 LAB — METABOLIC PANEL, BASIC
Anion gap: 6 mmol/L (ref 5–15)
BUN/Creatinine ratio: 18 (ref 12–20)
BUN: 11 MG/DL (ref 7–18)
CO2: 29 MMOL/L (ref 21–32)
Calcium: 9.3 MG/DL (ref 8.4–10.4)
Chloride: 105 MMOL/L (ref 100–108)
Creatinine: 0.6 MG/DL (ref 0.6–1.3)
GFR est AA: >60 mL/min/{1.73_m2} (ref >60)
GFR est non-AA: >60 mL/min/{1.73_m2} (ref >60)
Glucose: 87 MG/DL (ref 74–99)
Potassium: 4.6 MMOL/L (ref 3.5–5.5)
Sodium: 140 MMOL/L (ref 136–145)

## 2008-12-01 LAB — PROTHROMBIN TIME + INR
INR: 1 (ref 0.0–1.2)
Prothrombin time: 12.5 SECS (ref 11.5–15.2)

## 2008-12-01 LAB — PTT: aPTT: 29.2 s (ref 24.6–37.7)

## 2008-12-05 NOTE — H&P (Unsigned)
[facility]   [address] [place], [place] [address]     PRE-ADMISSION   HISTORY AND PHYSICAL    PATIENT: Cathy [name], Cathy [name]  MRN: [id number] DATE: 12/01/2008  BILLING: [id number] ROOM:  DICTATING: Danne Vasek G. Lamon Rotundo, MD      CHIEF COMPLAINT: Pain in the abdomen.    HISTORY OF PRESENT ILLNESS: This 58 year old, patient presented with pain  in the abdomen associated with nausea and vomiting for the past 5 years,  marked. Presents with history of chest pains, and right upper quadrant  epigastric pain. Cardiac workup has been negative and enzyme studies were  negative. The patient was referred by Dr. Luiz Blare, as well as Dr. [name], for  surgical evaluation and cholecystectomy.    A laparoscopic cholecystectomy was discussed with her, possible single  incision. The possible complications does not exclude bleeding, possible  bile duct injury, possible bowel injury, CO2 insufflation complication and  the need to do an open cholecystectomy. She understood and agreed to  proceed.    ALLERGIES: PENICILLIN    The patient has had a gallbladder ultrasound showing gallstones since 2008  and 2010, with a normal common bile duct. Denies diabetes mellitus, history  of hypertension, no seizure history. No asthma history. No bleeding  history. Question of rectal bleeding off and on. There is history  of increased cholesterol.    Smoking and alcohol history is negative.    MEDICATIONS: Consist of simvastatin and Coreg.    PREVIOUS SURGERIES: Cataract surgery and fibroid uterus removed.    FAMILY HISTORY: A brother with stomach cancer. No gallbladder disease in  the family.    PHYSICAL EXAMINATION: She is a well-developed, well-nourished, no distress  noted, alert, awake, oriented.  HEENT: Within normal limits. Negative icterus.  NECK: Supple. No adenopathy felt. No thyromegaly. Neck veins are slightly  visible.  CHEST: Clear to auscultation and percussion.  HEART: Regular rate and rhythm with systolic murmur.   ABDOMEN: Flat and soft. Bowel sounds are present. There is some mild deep  tenderness in the right upper epigastric region with pain in the right  upper quadrant. There is a scar in the midline from uterine fibroid  surgery.  EXTREMITIES: Shows no motor sensory deficit.    IMPRESSION  1. Chronic cholecystitis and cholelithiasis, symptomatic.  2. Cardiomyopathy, ejection fraction 27%.    PLAN: As discussed above.                         Date:______Time:______Signature________________________________   Arlice Colt. Sharmon Revere, MD    RGP:wmx  D: 12/05/2008 T: 12/05/2008 8:33 P  Job: [id number] CScriptDoc #: [id number]    cc: Clarine Elrod G. Sharmon Revere, MD

## 2008-12-06 NOTE — Op Note (Unsigned)
[facility]   [address] [place], [place] [address]     OPERATIVE REPORT    PATIENT: Cathy [name], Cathy [name]  MRN: [id number] DATE: 12/06/2008  BILLING: [id number]  ROOM: [id number]  ATTENDING: Vidalia Serpas G. Sharmon Revere, MD  DICTATING: Loghan Kurtzman G. Brenetta Penny, MD      PROCEDURE: Hybrid laparoscopic cholecystectomy.    PREOP DIAGNOSIS: Chronic cholecystitis and cholelithiasis, symptomatic.    POSTOP DIAGNOSIS: Chronic cholecystitis and cholelithiasis, symptomatic,  pending pathology.    FINDINGS: Presence of a very distended edematous gallbladder, covered with  significant adhesions of the omentum and the liver. The gallbladder is  completely covered, unable to be seen grossly except upon release of the  adhesions around. There was a stone within the gallbladder.    COMPLICATIONS: None.    INSTRUMENT COUNT: Correct.    ANESTHESIA: General given by the nurse anesthetist under doctor  supervision and Marcaine 0.25% plain local infiltration.    DESCRIPTION: With the patient in the supine position under adequate  general anesthesia, a Foley catheter was inserted and the abdomen was then  prepped with Betadine in the usual sterile fashion. Marcaine was  infiltrated into the navel. Incision was made longitudinally, releasing the  navel from the natural orifice. The natural orifice was then sutured on  both sides using 0 Vicryl UR needle, was then opened to about 2.5 cm.  Through this opening, the Alexis portion of the GelPort was inserted and  the plastic was folded inwards. The ________ was then mated preceded by  placement of four 5 mm trocars 2 on the left lateral peripherally, 1 on the  right peripherally, and 1 in the center.    Insufflation then maintained at 20 mmHG pressure. A sealed camera was  inserted through the middle port. A right angle dissector was placed in the  left superolateral port and a grasper was placed on the right lateral port.   Lifting the liver, the gallbladder was seen. The window of this was then  enlarged and a 5 mm port was placed in the upper mid part of the abdomen  for better dissection because of the adhesions. Through this a Harmonic  Scalpel was introduced and adhesions to the liver and ventrally surrounding  gallbladder was released very carefully close to the gallbladder. It was  then peeled off down exposing the whole gallbladder. Another grasper was  placed at about the gallbladder and a right angle was placed at the  epigastric port. The dissection was carried gently exposing the cystic  artery and cystic duct. The cystic duct was lying on top of the cystic  artery and ________ to the gallbladder wall divided anteriorly and  posteriorly. This was dissected very carefully. The cystic duct was  dissected underneath the left lateral Calot was seen in the process. The  cystic duct was dissected well and was divided as it leaves the gallbladder  using Harmonic Scalpel at level 2. The cystic artery was divided next as it  entered the gallbladder wall using Harmonic Scalpel at level 2. There was  excellent division and anticoagulation of the 2 stumps. The gallbladder was  then dissected further and at this point, the gallbladder was then pushed  above to allow exposure of the 2 stumps.    The cystic duct was endo-looped with 2-0 PDS, as well as another loop was  placed on the cystic artery and sutures divided along with the Harmonic  Scalpel.    The gallbladder was then excised carefully  using Harmonic Scalpel and was  dissected as well from the liver. Following excision of the gallbladder  completely the gallbladder was then extracted through the naval opening by  moving the gel portion. The gel portion was introduced and the gallbladder  bed was inspected. Surgicel was placed. The gallbladder bed and the  surrounding areas was irrigated and the cystic duct and cystic artery   stumps were seen. There is no oozing or bleeding noted. The oozing in the  gallbladder bed was controlled with Surgicel. Having secured good  hemostasis, CO2 insufflation was then stopped and allowed to escape. All  trocars were removed. The Alexis portion was removed as well. The wound was  then infiltrated with Marcaine 0.25% plain followed by closure of the 5 mm  port with subcuticular 4-0 Monocryl. The navel opening was then closed with  interrupted 0 Prolene in figure-of-eight manner. The skin was then closed  back to the rectus fascia using 2-0 Vicryl. Bacitracin ointment was  applied, followed by 4 x 4 and Tegaderm. The patient tolerated the  procedure well.                           Date:______Time:______Signature________________________________   Arlice Colt. Sharmon Revere, MD    RGP:wmx  D: 12/06/2008 T: 12/06/2008 9:18 P  Job: [id number] CScriptDoc #: [id number]    cc: Harless Litten, MD   Terrace Chiem G. Sharmon Revere, MD

## 2008-12-07 LAB — CBC WITH AUTOMATED DIFF
ABS. BASOPHILS: 0 10*3/uL (ref 0.0–0.1)
ABS. EOSINOPHILS: 0 10*3/uL (ref 0.0–0.4)
ABS. LYMPHOCYTES: 0.6 10*3/uL — ABNORMAL LOW (ref 0.8–3.5)
ABS. MONOCYTES: 0.2 10*3/uL (ref 0–1.0)
ABS. NEUTROPHILS: 6 10*3/uL (ref 1.8–8.0)
BASOPHILS: 0 % (ref 0–3)
EOSINOPHILS: 0 % (ref 0–5)
HCT: 34.4 % — ABNORMAL LOW (ref 36.0–46.0)
HGB: 11.8 g/dL — ABNORMAL LOW (ref 12.0–16.0)
LYMPHOCYTES: 8 % — ABNORMAL LOW (ref 20–51)
MCH: 31.7 PG (ref 25.0–35.0)
MCHC: 34.2 g/dL (ref 31.0–37.0)
MCV: 92.6 FL (ref 78.0–102.0)
MONOCYTES: 3 % (ref 2–9)
MPV: 9.1 FL (ref 7.4–10.4)
NEUTROPHILS: 89 % — ABNORMAL HIGH (ref 42–75)
PLATELET: 179 10*3/uL (ref 130–400)
RBC: 3.71 M/uL — ABNORMAL LOW (ref 4.10–5.10)
RDW: 14 % (ref 11.5–14.5)
WBC: 6.8 10*3/uL (ref 4.5–13.0)

## 2008-12-07 LAB — METABOLIC PANEL, BASIC
Anion gap: 8 mmol/L (ref 5–15)
BUN/Creatinine ratio: 22 — ABNORMAL HIGH (ref 12–20)
BUN: 13 MG/DL (ref 7–18)
CO2: 26 MMOL/L (ref 21–32)
Calcium: 8.5 MG/DL (ref 8.4–10.4)
Chloride: 101 MMOL/L (ref 100–108)
Creatinine: 0.6 MG/DL (ref 0.6–1.3)
GFR est AA: >60 mL/min/{1.73_m2} (ref >60)
GFR est non-AA: >60 mL/min/{1.73_m2} (ref >60)
Glucose: 143 MG/DL — ABNORMAL HIGH (ref 74–99)
Potassium: 5.3 MMOL/L (ref 3.5–5.5)
Sodium: 135 MMOL/L — ABNORMAL LOW (ref 136–145)

## 2008-12-08 NOTE — Discharge Summary (Unsigned)
[facility]   [address] [place], [place] [address]     DISCHARGE SUMMARY    PATIENT: Cathy [name], Cathy [name]  MRN: [id number] ADMIT DATE: 12/06/2008  BILLING: [id number] [facility] DATE: 12/07/2008  ATTENDING: Arlice Colt. Sharmon Revere, MD  DICTATING Keiondre Colee G. Jyll Tomaro, MD      PROCEDURE: Single-incision laparoscopic/hybrid cholecystectomy.    FINAL DIAGNOSES  1. Chronic cholecystitis secondary to cholelithiasis, symptomatic, pending  pathology.  2. Cardiomyopathy, with a history of hypertension.    DISCHARGE INSTRUCTIONS: To increase activity and diet as tolerated, to  resume home medications. Office check in 2 weeks, advised to call the  office for an appointment.    DISCHARGE MEDICATIONS: Percocet 5/325, 1 to 2 tablets p.o. every 4 hours  p.r.n. for pain.    HISTORY, COURSE, AND DISPOSITION: This 58 year old patient presented with  abdominal pain, nausea and vomiting over the past 5 years, coming on and  off, getting worse lately, had a cardiac workup which was negative. The  patient was referred by Dr. Luiz Blare and Dr. [name] for gallbladder removal.  Laparoscopic cholecystectomy was discussed, with possible risks and  complications, not to exclude bleeding, possible bile duct injury, possible  bowel injury, CO2 insufflation complications, and the need to do an open.  She understood and agreed to proceed.    At surgery she had a gallbladder that was swollen and pretty much adhered  around the omentum and the liver, and there was a stone noted at the neck  of the gallbladder. Single-incision/hybrid laparoscopic cholecystectomy was  performed, uneventful. The patient has done very well overnight except for  significant amount of pain. There has been no nausea or vomiting, and the  patient wants to go home. She is afebrile. The wound is clean and dry. She  was then discharged with additional medication of Percocet for pain and to  resume patient's home medications. Office check in 1 week to 10 days.                            Date:______Time:______Signature________________________________   Arlice Colt. Sharmon Revere, MD    RGP:wmx  D: 12/07/2008 T: 12/08/2008 2:04 A  Job: [id number] CScriptDoc #: [id number]  cc: Carlynn Herald, MD   Modesto Charon, MD   Nancyann Cotterman G. Sharmon Revere, MD

## 2008-12-13 MED ORDER — ZOLPIDEM 10 MG TAB
10 mg | ORAL_TABLET | Freq: Every evening | ORAL | Status: DC | PRN
Start: 2008-12-13 — End: 2009-06-06

## 2008-12-13 NOTE — Telephone Encounter (Signed)
12/13/08 per Dr.Graves the patient was notified to come by the office and pick up her printed script Ambien 10mg  up at the front desk. Patient states she understands all.

## 2008-12-13 NOTE — Telephone Encounter (Signed)
Message copied by Christen Butter on Wed Dec 13, 2008  2:03 PM  ------       Message from: Cordelia Poche       Created: Wed Dec 13, 2008  9:19 AM       Regarding: graves       Contact: [phone]         Cathy [name] is having trouble sleeping could she have a refill of ambien--call to [facility] [phone]

## 2009-01-15 MED ORDER — SIMVASTATIN 20 MG TAB
20 mg | ORAL_TABLET | ORAL | Status: DC
Start: 2009-01-15 — End: 2009-08-03

## 2009-01-15 NOTE — Telephone Encounter (Signed)
Automated refill request authorized.

## 2009-06-06 MED ORDER — HYDROCODONE-ACETAMINOPHEN 5 MG-500 MG TAB
5-500 mg | ORAL_TABLET | ORAL | Status: DC
Start: 2009-06-06 — End: 2009-07-17

## 2009-06-06 MED ORDER — DICLOFENAC 75 MG TAB, DELAYED RELEASE
75 mg | ORAL_TABLET | Freq: Two times a day (BID) | ORAL | Status: DC
Start: 2009-06-06 — End: 2009-10-17

## 2009-06-06 NOTE — Progress Notes (Signed)
Chief Complaint   Patient presents with   ??? Leg Pain     left; started 2 weeks ago       HISTORY OF THE PRESENT ILLNESS:  Cathy [name] is a 59 y.o. female    Leg pain  7/10  Left  Going on for 2 weeks  I had it before   I had test for blood clots in the past    Past Medical History   Diagnosis Date   ??? CHF (congestive heart failure) 06/23/2008   ??? 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 Fibroid 06/23/2008   ??? HTN (hypertension) 06/23/2008   ??? History of elevated lipids 06/23/2008   ??? Breast mass, left      benign   ??? OSA (obstructive sleep apnea) 9/10     on cpap       Family History   Problem Relation Age of Onset   ??? Diabetes Mother    ??? Heart Attack Father      MI   ??? Heart Disease Maternal Grandmother    ??? Heart Disease Paternal Grandmother    ??? Kidney Disease Other      1 sib deceased   ??? Cancer Other      1 sib throat cancer   ??? Diabetes Other    ??? Diabetes Daughter    ??? Other Daughter      leukemia       History   Social History   ??? Marital Status: Married     Spouse Name: N/A     Number of Children: N/A   ??? 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 Smoking status: Never Smoker    ??? Smokeless tobacco: Not on file   ??? Alcohol Use: No   ??? Drug Use: No   ??? Sexually Active: Not on file   Other Topics Concern   ??? Not on file   Social History Narrative   ??? No narrative on file       MEDICATIONS:  Current outpatient prescriptions   Medication Sig   ??? diclofenac EC (VOLTAREN) 75 mg EC tablet Take 1 Tab by mouth two (2) times a day.   ??? hydrocodone-acetaminophen (VICODIN) 5-500 mg per tablet Take 1 Tab by mouth as directed. As needed one to two tabs in the evening     ??? simvastatin (ZOCOR) 20 mg tablet 1HS - TAKE ONE TABLET BY MOUTH AT BEDTIME   ??? carvedilol (COREG) 6.25 mg tablet Take 6.25 mg by mouth daily.       Medications Discontinued During This Encounter   Medication Reason    ??? fluticasone (FLONASE) 50 mcg/Actuation nasal spray Other   ??? promethazine (PHENERGAN) 12.5 mg tablet Other   ??? zolpidem (AMBIEN) 10 mg tablet Other       Allergies   Allergen Reactions   ??? Pcn (Penicillins) Itching     Itching bumps on hands and arms       REVIEW OF SYSTEMS:  CVS:    No Chest pain or Palpitations  Respiratory:  No Shortness of breath or cough  PHYSICAL EXAMINATION:  Constitutional: She is oriented. She appears well-developed and well-nourished.   BP 126/76   Pulse 88   Temp(Src) 97.1 ??F (36.2 ??C) (Oral)   Ht 5\' 7"  (1.702 m)   Wt 210 lb (95.255  kg)   BMI 32.89 kg/m2  HENT: ??  Head: Normocephalic and atraumatic.   Nose: Nose normal. ??  Mouth/Throat: Oropharynx is clear and moist. No oropharyngeal exudate.   Eyes: Conjunctivae and extraocular motions are normal. Pupils are equal, round, and reactive to light. Right eye exhibits no discharge. Left eye exhibits no discharge. No scleral icterus.   Neck: Neck supple. No thyromegaly present. She has no cervical adenopathy.   Cardiovascular: Normal rate, regular rhythm and normal heart sounds.?? Exam reveals no gallop and no friction rub.?? ??  No murmur heard.  Extremities no edema.  Pulmonary/Chest: Effort normal and breath sounds normal. No respiratory distress. She has no wheezes. She has no rales.   Abdominal: Soft. Bowel sounds are normal. She exhibits no distension and no mass. No tenderness. She has no rebound and no guarding.     LABORATORY AND IMAGING DATA:  Results for orders placed during the hospital encounter of 12/06/08   PROTHROMBIN TIME   Component Value Range   ??? Prothrombin Time-PT 12.5  11.5 - 15.2 (SECS)   ??? INR 1.0  0.0 - 1.2 ( )   PTT   Component Value Range   ??? aPTT 29.2  24.6 - 37.7 (SEC)   CBC WITH AUTOMATED DIFF   Component Value Range   ??? WBC 3.4 (*) 4.5 - 13.0 (K/uL)   ??? RBC 3.98 (*) 4.10 - 5.10 (M/uL)   ??? HGB 12.4  12.0 - 16.0 (g/dL)   ??? HCT 37.6  36.0 - 46.0 (%)   ??? MCV 94.5  78.0 - 102.0 (FL)   ??? MCH 31.1  25.0 - 35.0 (PG)    ??? MCHC 32.9  31.0 - 37.0 (g/dL)   ??? RDW 13.9  11.5 - 14.5 (%)   ??? PLATELET 214  130 - 400 (K/uL)   ??? MPV 9.3  7.4 - 10.4 (FL)   ??? NEUTROPHILS 61  42 - 75 (%)   ??? LYMPHOCYTES 30  20 - 51 (%)   ??? MONOCYTES 8  2 - 9 (%)   ??? EOSINOPHILS 1  0 - 5 (%)   ??? BASOPHILS 0  0 - 3 (%)   ??? ABSOLUTE NEUTS 2.1  1.8 - 8.0 (K/UL)   ??? ABSOLUTE LYMPHS 1.0  0.8 - 3.5 (K/UL)   ??? ABSOLUTE MONOS 0.3  0 - 1.0 (K/UL)   ??? ABSOLUTE EOSINS 0.0  0.0 - 0.4 (K/UL)   ??? ABSOLUTE BASOS 0.0  0.0 - 0.1 (K/UL)   ??? DF AUTOMATED     METABOLIC PANEL, BASIC   Component Value Range   ??? Sodium 140  136 - 145 (MMOL/L)   ??? Potassium 4.6  3.5 - 5.5 (MMOL/L)   ??? Chloride 105  100 - 108 (MMOL/L)   ??? CO2 29  21 - 32 (MMOL/L)   ??? Anion gap 6  5 - 15 (mmol/L)   ??? Glucose 87  74 - 99 (MG/DL)   ??? BUN 11  7 - 18 (MG/DL)   ??? Creatinine 0.6  0.6 - 1.3 (MG/DL)   ??? BUN/Creatinine ratio 18  12 - 20 ( )   ??? GFR est AA >60  >60 (ml/min/1.70m2)   ??? GFR est non-AA >60  >60 (ml/min/1.21m2)   ??? Calcium 9.3  8.4 - 10.4 (MG/DL)   METABOLIC PANEL, BASIC   Component Value Range   ??? Sodium 135 (*) 136 - 145 (MMOL/L)   ??? Potassium 5.3  3.5 - 5.5 (MMOL/L)   ???  Chloride 101  100 - 108 (MMOL/L)   ??? CO2 26  21 - 32 (MMOL/L)   ??? Anion gap 8  5 - 15 (mmol/L)   ??? Glucose 143 (*) 74 - 99 (MG/DL)   ??? BUN 13  7 - 18 (MG/DL)   ??? Creatinine 0.6  0.6 - 1.3 (MG/DL)   ??? BUN/Creatinine ratio 22 (*) 12 - 20 ( )   ??? GFR est AA >60  >60 (ml/min/1.45m2)   ??? GFR est non-AA >60  >60 (ml/min/1.74m2)   ??? Calcium 8.5  8.4 - 10.4 (MG/DL)   CBC WITH AUTOMATED DIFF   Component Value Range   ??? WBC 6.8  4.5 - 13.0 (K/uL)   ??? RBC 3.71 (*) 4.10 - 5.10 (M/uL)   ??? HGB 11.8 (*) 12.0 - 16.0 (g/dL)   ??? HCT 34.4 (*) 36.0 - 46.0 (%)   ??? MCV 92.6  78.0 - 102.0 (FL)   ??? MCH 31.7  25.0 - 35.0 (PG)   ??? MCHC 34.2  31.0 - 37.0 (g/dL)   ??? RDW 14.0  11.5 - 14.5 (%)   ??? PLATELET 179  130 - 400 (K/uL)   ??? MPV 9.1  7.4 - 10.4 (FL)   ??? NEUTROPHILS 89 (*) 42 - 75 (%)   ??? LYMPHOCYTES 8 (*) 20 - 51 (%)   ??? MONOCYTES 3  2 - 9 (%)    ??? EOSINOPHILS 0  0 - 5 (%)   ??? BASOPHILS 0  0 - 3 (%)   ??? ABSOLUTE NEUTS 6.0  1.8 - 8.0 (K/UL)   ??? ABSOLUTE LYMPHS 0.6 (*) 0.8 - 3.5 (K/UL)   ??? ABSOLUTE MONOS 0.2  0 - 1.0 (K/UL)   ??? ABSOLUTE EOSINS 0.0  0.0 - 0.4 (K/UL)   ??? ABSOLUTE BASOS 0.0  0.0 - 0.1 (K/UL)   ??? DF AUTOMATED           ASSESSMENT AND PLAN:    Cathy [name] was seen today for leg pain.    Diagnoses and associated orders for this visit:    Leg pain  Uncontrolled     - XR KNEE LEFT 3 VIEWS; Future  - SED RATE (ESR)  - METABOLIC PANEL, BASIC  - CBC WITH AUTOMATED DIFF  - ANA (ANTINUCLEAR ANTIBODIES)  - RHEUMATOID FACTOR, CSF OR SERUM  - diclofenac EC (VOLTAREN) 75 mg EC tablet; Take 1 Tab by mouth two (2) times a day.  - hydrocodone-acetaminophen (VICODIN) 5-500 mg per tablet; Take 1 Tab by mouth as directed. As needed one to two tabs in the evening  -      Counseled how to use meds and side effect and benefit and vicodin could be habit forming

## 2009-06-06 NOTE — Patient Instructions (Signed)
English   Spanish  Leg Pain: After Your Visit  Your Care Instructions  Many things can cause leg pain. Too much exercise or overuse can cause a muscle cramp (or charley horse). You can get leg cramps from not eating a balanced diet that has enough potassium, calcium, and other minerals. If you do not drink enough fluids or are taking certain medicines, you may develop leg cramps. Other causes of leg pain include injuries, blood flow problems, nerve damage, and twisted and enlarged veins (varicose veins).  You can usually ease pain with self-care. Your doctor may recommend that you rest your leg and keep it elevated.  Follow-up care is a key part of your treatment and safety. Be sure to make and go to all appointments, and call your doctor if you are having problems. It???s also a good idea to know your test results and keep a list of the medicines you take.  How can you care for yourself at home?  ?? Take pain medicines exactly as directed.   ?? If the doctor gave you a prescription medicine for pain, take it as prescribed.   ?? If you are not taking a prescription pain medicine, ask your doctor if you can take an over-the-counter medicine.   ?? Do not take two or more pain medicines at the same time unless the doctor told you to. Many pain medicines contain acetaminophen, which is Tylenol. Too much acetaminophen (Tylenol) can be harmful.   ?? No one younger than 20 should take aspirin. It has been linked to Reye syndrome, a serious illness.  ?? Take any other medicines exactly as prescribed. Call your doctor if you think you are having a problem with your medicine.   ?? Rest your leg while you have pain, and avoid standing for long periods of time.   ?? Prop up your leg at or above the level of your heart when possible.   ?? Make sure you are eating a balanced diet that is rich in calcium, potassium, and magnesium, especially if you are pregnant.    ?? If directed by your doctor, put ice or a cold pack on the area for 10 to 20 minutes at a time. Put a thin cloth between the ice and your skin.   ?? Your leg may be in a splint, a brace, or an elastic bandage, and you may have crutches to help you walk. Follow your doctor's directions about how long to wear supports and how to use the crutches.  When should you call for help?  Call 911 anytime you think you may need emergency care. For example, call if:  ?? You have sudden chest pain and shortness of breath, or you cough up blood.   ?? Your leg is cool or pale or changes color.  Call your doctor now or seek immediate medical care if:  ?? You have increasing or severe pain.   ?? Your leg suddenly feels weak and you cannot move it.   ?? You have signs of a blood clot, such as:   ?? Pain in your calf, back of the knee, thigh, or groin.   ?? Redness and swelling in your leg or groin.  ?? You have signs of infection, such as:   ?? Increased pain, swelling, warmth, or redness.   ?? Red streaks leading from the sore area.   ?? Pus draining from a place on your leg.   ?? Swollen lymph nodes in your neck, armpits, or   groin.   ?? A fever.  ?? You cannot bear weight on your leg.  Watch closely for changes in your health, and be sure to contact your doctor if:  ?? You do not get better as expected.     Where can you learn more?   Go to http://www.healthwise.net/[facility]  Enter W987 in the search box to learn more about "Leg Pain: After Your Visit."   ?? 1995-2010 Healthwise, Incorporated. Care instructions adapted under license by [employer] (which disclaims liability or warranty for this information). This care instruction is for use with your licensed healthcare professional. If you have questions about a medical condition or this instruction, always ask your healthcare professional. Healthwise disclaims any warranty or liability for your use of this information.  Content Version: 8.8.72453; Last Revised: May 13, 2007

## 2009-06-06 NOTE — Progress Notes (Signed)
Chief Complaint   Patient presents with   ??? Leg Pain     left; started 2 weeks ago

## 2009-06-08 LAB — METABOLIC PANEL, BASIC
Anion gap: 3 mmol/L — ABNORMAL LOW (ref 5–15)
BUN/Creatinine ratio: 23 — ABNORMAL HIGH (ref 12–20)
BUN: 16 MG/DL (ref 7–18)
CO2: 30 MMOL/L (ref 21–32)
Calcium: 8.7 MG/DL (ref 8.4–10.4)
Chloride: 105 MMOL/L (ref 100–108)
Creatinine: 0.7 MG/DL (ref 0.6–1.3)
GFR est AA: >60 mL/min/{1.73_m2} (ref >60)
GFR est non-AA: >60 mL/min/{1.73_m2} (ref >60)
Glucose: 85 MG/DL (ref 74–99)
Potassium: 4.4 MMOL/L (ref 3.5–5.5)
Sodium: 138 MMOL/L (ref 136–145)

## 2009-06-08 LAB — CBC WITH AUTOMATED DIFF
ABS. BASOPHILS: 0 10*3/uL (ref 0.0–0.1)
ABS. EOSINOPHILS: 0 10*3/uL (ref 0.0–0.4)
ABS. LYMPHOCYTES: 1.2 10*3/uL (ref 0.8–3.5)
ABS. MONOCYTES: 0.3 10*3/uL (ref 0–1.0)
ABS. NEUTROPHILS: 2.1 10*3/uL (ref 1.8–8.0)
BASOPHILS: 1 % (ref 0–3)
EOSINOPHILS: 1 % (ref 0–5)
HCT: 36.6 % (ref 36.0–46.0)
HGB: 12.1 g/dL (ref 12.0–16.0)
LYMPHOCYTES: 33 % (ref 20–51)
MCH: 31.3 PG (ref 25.0–35.0)
MCHC: 33 g/dL (ref 31.0–37.0)
MCV: 94.7 FL (ref 78.0–102.0)
MONOCYTES: 8 % (ref 2–9)
MPV: 9.9 FL (ref 7.4–10.4)
NEUTROPHILS: 57 % (ref 42–75)
PLATELET: 204 10*3/uL (ref 130–400)
RBC: 3.87 M/uL — ABNORMAL LOW (ref 4.10–5.10)
RDW: 14 % (ref 11.5–14.5)
WBC: 3.6 10*3/uL — ABNORMAL LOW (ref 4.5–13.0)

## 2009-06-08 LAB — SED RATE (ESR): Sed rate (ESR): 21 MM/HR (ref 0–30)

## 2009-06-10 LAB — ANA BY MULTIPLEX FLOW IA, QL
ANA, Direct: NOT DETECTED
ANA: NOT DETECTED

## 2009-06-20 MED ORDER — CHLORZOXAZONE 500 MG TAB
500 mg | ORAL_TABLET | Freq: Two times a day (BID) | ORAL | Status: AC
Start: 2009-06-20 — End: 2009-06-30

## 2009-06-20 NOTE — Progress Notes (Signed)
Chief Complaint   Patient presents with   ??? Follow-up     to previous visit for left leg pain

## 2009-06-20 NOTE — Progress Notes (Signed)
Chief Complaint   Patient presents with   ??? Follow-up     to previous visit for left leg pain       HISTORY OF THE PRESENT ILLNESS:  Cathy [name] is a 59 y.o. female    Leg pain  Comes sharp and it leaves  Not constant  When it hits 10/10  Last 30 seconds  I have it every hours  Nothing brings it on specifically    Past Medical History   Diagnosis Date   ??? CHF (congestive heart failure) 06/23/2008   ??? 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 Fibroid 06/23/2008   ??? HTN (hypertension) 06/23/2008   ??? History of elevated lipids 06/23/2008   ??? Breast mass, left      benign   ??? OSA (obstructive sleep apnea) 9/10     on cpap       Family History   Problem Relation Age of Onset   ??? Diabetes Mother    ??? Heart Attack Father      MI   ??? Heart Disease Maternal Grandmother    ??? Heart Disease Paternal Grandmother    ??? Kidney Disease Other      1 sib deceased   ??? Cancer Other      1 sib throat cancer   ??? Diabetes Other    ??? Diabetes Daughter    ??? Other Daughter      leukemia       History   Social History   ??? Marital Status: Married     Spouse Name: N/A     Number of Children: N/A   ??? 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 Smoking status: Never Smoker    ??? Smokeless tobacco: Never Used   ??? Alcohol Use: No   ??? Drug Use: No   ??? Sexually Active: Not on file   Other Topics Concern   ??? Not on file   Social History Narrative   ??? No narrative on file       MEDICATIONS:  Current outpatient prescriptions   Medication Sig   ??? diclofenac EC (VOLTAREN) 75 mg EC tablet Take 1 Tab by mouth two (2) times a day.   ??? hydrocodone-acetaminophen (VICODIN) 5-500 mg per tablet Take 1 Tab by mouth as directed. As needed one to two tabs in the evening     ??? simvastatin (ZOCOR) 20 mg tablet 1HS - TAKE ONE TABLET BY MOUTH AT BEDTIME   ??? carvedilol (COREG) 6.25 mg tablet Take 6.25 mg by mouth daily.    ??? chlorzoxazone (PARAFON FORTE) 500 mg tablet Take 1 Tab by mouth two (2) times a day for 10 days.       There are no discontinued medications.  Allergies   Allergen Reactions   ??? Pcn (Penicillins) Itching     Itching bumps on hands and arms       REVIEW OF SYSTEMS:  [facility]S:    No Chest pain or Palpitations  Respiratory:  No Shortness of breath or cough  PHYSICAL EXAMINATION:  Constitutional: She is oriented. She appears well-developed and well-nourished.   BP 114/72   Pulse 88   Temp(Src) 97.7 ??F (36.5 ??C) (Oral)   Ht 5\' 7"  (1.702 m)   Wt 210 lb (95.255 kg)   BMI 32.89 kg/m2  HENT: ??  Head:  Normocephalic and atraumatic.   Nose: Nose normal. ??  Mouth/Throat: Oropharynx is clear and moist. No oropharyngeal exudate.   Eyes: Conjunctivae and extraocular motions are normal. Pupils are equal, round, and reactive to light. Right eye exhibits no discharge. Left eye exhibits no discharge. No scleral icterus.   Neck: Neck supple. No thyromegaly present. She has no cervical adenopathy.   Cardiovascular: Normal rate, regular rhythm and normal heart sounds.?? Exam reveals no gallop and no friction rub.?? ??  No murmur heard.  Extremities no edema.  Pulmonary/Chest: Effort normal and breath sounds normal. No respiratory distress. She has no wheezes. She has no rales.   Abdominal: Soft. Bowel sounds are normal. She exhibits no distension and no mass. No tenderness. She has no rebound and no guarding.     LABORATORY AND IMAGING DATA:  Results for orders placed during the hospital encounter of 06/08/09   ANA (ANTINUCLEAR ANTIBODIES)   Component Value Range   ??? Anti-nuclear Ab(ANA) None Detected  None Detected    CBC WITH AUTOMATED DIFF   Component Value Range   ??? WBC 3.6 (*) 4.5 - 13.0 (K/uL)   ??? RBC 3.87 (*) 4.10 - 5.10 (M/uL)   ??? HGB 12.1  12.0 - 16.0 (g/dL)   ??? HCT 36.6  36.0 - 46.0 (%)   ??? MCV 94.7  78.0 - 102.0 (FL)   ??? MCH 31.3  25.0 - 35.0 (PG)   ??? MCHC 33.0  31.0 - 37.0 (g/dL)   ??? RDW 14.0  11.5 - 14.5 (%)    ??? PLATELET 204  130 - 400 (K/uL)   ??? MPV 9.9  7.4 - 10.4 (FL)   ??? NEUTROPHILS 57  42 - 75 (%)   ??? LYMPHOCYTES 33  20 - 51 (%)   ??? MONOCYTES 8  2 - 9 (%)   ??? EOSINOPHILS 1  0 - 5 (%)   ??? BASOPHILS 1  0 - 3 (%)   ??? ABSOLUTE NEUTS 2.1  1.8 - 8.0 (K/UL)   ??? ABSOLUTE LYMPHS 1.2  0.8 - 3.5 (K/UL)   ??? ABSOLUTE MONOS 0.3  0 - 1.0 (K/UL)   ??? ABSOLUTE EOSINS 0.0  0.0 - 0.4 (K/UL)   ??? ABSOLUTE BASOS 0.0  0.0 - 0.1 (K/UL)   ??? DF AUTOMATED     METABOLIC PANEL, BASIC   Component Value Range   ??? Sodium 138  136 - 145 (MMOL/L)   ??? Potassium 4.4  3.5 - 5.5 (MMOL/L)   ??? Chloride 105  100 - 108 (MMOL/L)   ??? CO2 30  21 - 32 (MMOL/L)   ??? Anion gap 3 (*) 5 - 15 (mmol/L)   ??? Glucose 85  74 - 99 (MG/DL)   ??? BUN 16  7 - 18 (MG/DL)   ??? Creatinine 0.7  0.6 - 1.3 (MG/DL)   ??? BUN/Creatinine ratio 23 (*) 12 - 20 ( )   ??? GFR est AA >60  >60 (ml/min/1.106m2)   ??? GFR est non-AA >60  >60 (ml/min/1.40m2)   ??? Calcium 8.7  8.4 - 10.4 (MG/DL)   SED RATE (ESR)   Component Value Range   ??? Sed rate (ESR) 21  0 - 30 (MM/HR)         ASSESSMENT AND PLAN:    [name] was seen today for follow-up.    Diagnoses and associated orders for this visit:    Leg pain  - REFERRAL TO [facility]  - XR SPINE LUMBAR 2 OR  3 VIEWS; Future  - TSH, 3RD GENERATION  - T4, FREE    Other Orders  - chlorzoxazone (PARAFON FORTE) 500 mg tablet; Take 1 Tab by mouth two (2) times a day for 10 days.          Problem 1 is uncontrolled   Data utilized Ordered lab 1 point, Ordered X ray 1 point, PVL done 1 year ago was obtained and reviewed to assess risk for DVT Total Data: 3  Risk Moderate new prescription drug management pt counseled re benefits and side effects      continue current, medication changes and labs as above            Patient Instruction.   Never assume a test is normal if not contacted. Any orders normal or abnormal will always be communicated. You are always welcome to call for results. Regular follow up is always necessary as directed.

## 2009-06-20 NOTE — Patient Instructions (Signed)
English   Spanish  Leg Pain: After Your Visit  Your Care Instructions  Many things can cause leg pain. Too much exercise or overuse can cause a muscle cramp (or charley horse). You can get leg cramps from not eating a balanced diet that has enough potassium, calcium, and other minerals. If you do not drink enough fluids or are taking certain medicines, you may develop leg cramps. Other causes of leg pain include injuries, blood flow problems, nerve damage, and twisted and enlarged veins (varicose veins).  You can usually ease pain with self-care. Your doctor may recommend that you rest your leg and keep it elevated.  Follow-up care is a key part of your treatment and safety. Be sure to make and go to all appointments, and call your doctor if you are having problems. It???s also a good idea to know your test results and keep a list of the medicines you take.  How can you care for yourself at home?  ?? Take pain medicines exactly as directed.   ?? If the doctor gave you a prescription medicine for pain, take it as prescribed.   ?? If you are not taking a prescription pain medicine, ask your doctor if you can take an over-the-counter medicine.   ?? Do not take two or more pain medicines at the same time unless the doctor told you to. Many pain medicines contain acetaminophen, which is Tylenol. Too much acetaminophen (Tylenol) can be harmful.   ?? No one younger than 20 should take aspirin. It has been linked to Reye syndrome, a serious illness.  ?? Take any other medicines exactly as prescribed. Call your doctor if you think you are having a problem with your medicine.   ?? Rest your leg while you have pain, and avoid standing for long periods of time.   ?? Prop up your leg at or above the level of your heart when possible.   ?? Make sure you are eating a balanced diet that is rich in calcium, potassium, and magnesium, especially if you are pregnant.    ?? If directed by your doctor, put ice or a cold pack on the area for 10 to 20 minutes at a time. Put a thin cloth between the ice and your skin.   ?? Your leg may be in a splint, a brace, or an elastic bandage, and you may have crutches to help you walk. Follow your doctor's directions about how long to wear supports and how to use the crutches.  When should you call for help?  Call 911 anytime you think you may need emergency care. For example, call if:  ?? You have sudden chest pain and shortness of breath, or you cough up blood.   ?? Your leg is cool or pale or changes color.  Call your doctor now or seek immediate medical care if:  ?? You have increasing or severe pain.   ?? Your leg suddenly feels weak and you cannot move it.   ?? You have signs of a blood clot, such as:   ?? Pain in your calf, back of the knee, thigh, or groin.   ?? Redness and swelling in your leg or groin.  ?? You have signs of infection, such as:   ?? Increased pain, swelling, warmth, or redness.   ?? Red streaks leading from the sore area.   ?? Pus draining from a place on your leg.   ?? Swollen lymph nodes in your neck, armpits, or   groin.   ?? A fever.  ?? You cannot bear weight on your leg.  Watch closely for changes in your health, and be sure to contact your doctor if:  ?? You do not get better as expected.     Where can you learn more?   Go to http://www.healthwise.net/[facility]  Enter W987 in the search box to learn more about "Leg Pain: After Your Visit."   ?? 1995-2010 Healthwise, Incorporated. Care instructions adapted under license by [employer] (which disclaims liability or warranty for this information). This care instruction is for use with your licensed healthcare professional. If you have questions about a medical condition or this instruction, always ask your healthcare professional. Healthwise disclaims any warranty or liability for your use of this information.  Content Version: 8.8.72453; Last Revised: May 13, 2007

## 2009-06-28 NOTE — Telephone Encounter (Signed)
Pt is scheduled to see Dr. Consuello Closs (surg) on 07/02/09 at 10:30 am at the [facility] [address] office

## 2009-07-14 LAB — TSH 3RD GENERATION: TSH: 1.32 u[IU]/mL (ref 0.450–4.500)

## 2009-07-14 LAB — T4, FREE: T4, Free: 1.04 ng/dL (ref 0.82–1.77)

## 2009-07-17 MED ORDER — CHLORZOXAZONE 500 MG TAB
500 mg | ORAL_TABLET | Freq: Two times a day (BID) | ORAL | Status: DC
Start: 2009-07-17 — End: 2009-10-17

## 2009-07-17 MED ORDER — HYDROCODONE-ACETAMINOPHEN 5 MG-500 MG TAB
5-500 mg | ORAL_TABLET | ORAL | Status: DC
Start: 2009-07-17 — End: 2009-10-17

## 2009-07-17 NOTE — Progress Notes (Signed)
Chief Complaint   Patient presents with   ??? Cholesterol Problem     high chol   ??? CHF   ??? Hypertension       HISTORY OF THE PRESENT ILLNESS:  Cathy [name] is a 59 y.o. female    Feeling better  Using stockings  Seen dr. Consuello Closs   Considering surgery      Past Medical History   Diagnosis Date   ??? CHF (congestive heart failure) 06/23/2008   ??? 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 Fibroid 06/23/2008   ??? HTN (hypertension) 06/23/2008   ??? History of elevated lipids 06/23/2008   ??? Breast mass, left      benign   ??? OSA (obstructive sleep apnea) 9/10     on cpap       Family History   Problem Relation Age of Onset   ??? Diabetes Mother    ??? Heart Attack Father      MI   ??? Heart Disease Maternal Grandmother    ??? Heart Disease Paternal Grandmother    ??? Kidney Disease Other      1 sib deceased   ??? Cancer Other      1 sib throat cancer   ??? Diabetes Other    ??? Diabetes Daughter    ??? Other Daughter      leukemia       History   Social History   ??? Marital Status: Married     Spouse Name: N/A     Number of Children: N/A   ??? 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 Smoking status: Never Smoker    ??? Smokeless tobacco: Never Used   ??? Alcohol Use: No   ??? Drug Use: No   ??? Sexually Active: Not on file   Other Topics Concern   ??? Not on file   Social History Narrative   ??? No narrative on file       MEDICATIONS:  Current outpatient prescriptions   Medication Sig   ??? hydrocodone-acetaminophen (VICODIN) 5-500 mg per tablet Take 1 Tab by mouth as directed. As needed one to two tabs in the evening     ??? chlorzoxazone (PARAFON FORTE DSC) 500 mg tablet Take 1 Tab by mouth two (2) times a day.   ??? simvastatin (ZOCOR) 20 mg tablet 1HS - TAKE ONE TABLET BY MOUTH AT BEDTIME   ??? carvedilol (COREG) 6.25 mg tablet Take 6.25 mg by mouth daily.   ??? diclofenac EC (VOLTAREN) 75 mg EC tablet Take 1 Tab by mouth two (2) times a day.        Medications Discontinued During This Encounter   Medication Reason   ??? hydrocodone-acetaminophen (VICODIN) 5-500 mg per tablet Reorder   ??? chlorzoxazone (PARAFON FORTE DSC) 500 mg tablet Reorder       Allergies   Allergen Reactions   ??? Pcn (Penicillins) Itching     Itching bumps on hands and arms       REVIEW OF SYSTEMS:  CVS:    No Chest pain or Palpitations  Respiratory:  No Shortness of breath or cough  PHYSICAL EXAMINATION:  Constitutional: She is oriented. She appears well-developed and well-nourished.   BP 118/71   Pulse 82   Temp(Src) 97 ??F (36.1 ??C) (Oral)   Ht 5\' 7"  (1.702 m)  Wt 211 lb 4 oz (95.822 kg)   BMI 33.09 kg/m2  HENT: ??  Head: Normocephalic and atraumatic.   Nose: Nose normal. ??  Mouth/Throat: Oropharynx is clear and moist. No oropharyngeal exudate.   Eyes: Conjunctivae and extraocular motions are normal. Pupils are equal, round, and reactive to light. Right eye exhibits no discharge. Left eye exhibits no discharge. No scleral icterus.   Neck: Neck supple. No thyromegaly present. She has no cervical adenopathy.   Cardiovascular: Normal rate, regular rhythm and normal heart sounds.?? Exam reveals no gallop and no friction rub.?? ??  No murmur heard.  Extremities no edema.  Pulmonary/Chest: Effort normal and breath sounds normal. No respiratory distress. She has no wheezes. She has no rales.   Abdominal: Soft. Bowel sounds are normal. She exhibits no distension and no mass. No tenderness. She has no rebound and no guarding.     LABORATORY AND IMAGING DATA:  Results for orders placed in visit on 06/20/09   TSH, 3RD GENERATION   Component Value Range   ??? TSH, 3rd generation 1.320  0.450 - 4.500 (uIU/mL)   T4, FREE   Component Value Range   ??? T4, Free 1.04  0.82 - 1.77 (ng/dL)         ASSESSMENT AND PLAN:    Shemiah was seen today for cholesterol problem, chf and hypertension.    Diagnoses and associated orders for this visit:    Leg pain    Insomnia    Other Orders   - Discontinue: chlorzoxazone (PARAFON FORTE DSC) 500 mg tablet; Take 500 mg by mouth two (2) times a day.  - hydrocodone-acetaminophen (VICODIN) 5-500 mg per tablet; Take 1 Tab by mouth as directed. As needed one to two tabs in the evening    - chlorzoxazone (PARAFON FORTE DSC) 500 mg tablet; Take 1 Tab by mouth two (2) times a day.

## 2009-07-17 NOTE — Patient Instructions (Signed)
English   Spanish  Insomnia: After Your Visit  Your Care Instructions  Insomnia is the inability to sleep well. It is a common problem for most people at some time. Insomnia may make it hard for you to get to sleep, stay asleep, or sleep as long as you need to. This can make you tired and grouchy during the day. It can also make you forgetful, less effective at work, and unhappy.  Insomnia can be caused by conditions such as depression or anxiety. Pain can also affect your ability to sleep. When these problems are solved, the insomnia usually clears up. But sometimes bad sleep habits can cause insomnia.  If insomnia is affecting your work or your enjoyment of life, you can take steps to improve your sleep.  Follow-up care is a key part of your treatment and safety. Be sure to make and go to all appointments, and call your doctor if you are having problems. It???s also a good idea to know your test results and keep a list of the medicines you take.  How can you care for yourself at home?  What to avoid  ?? Do not have drinks with caffeine, such as coffee or black tea, for 8 hours before bed.   ?? Do not smoke or use other types of tobacco near bedtime. Nicotine is a stimulant and can keep you awake.   ?? Avoid drinking alcohol late in the evening, because it can cause you to wake in the middle of the night.   ?? Do not eat a big meal close to bedtime. If you are hungry, eat a light snack.   ?? Do not drink a lot of water close to bedtime, because the need to urinate may wake you up during the night.   ?? Do not read or watch TV in bed. Use the bed only for sleeping and sexual activity.  What to try  ?? Go to bed at the same time every night, and wake up at the same time every morning. Do not take naps during the day.   ?? Keep your bedroom quiet, dark, and cool.   ?? Get regular exercise, but not within 3 to 4 hours of your bedtime..   ?? Sleep on a comfortable pillow and mattress.    ?? If watching the clock makes you anxious, turn it facing away from you so you cannot see the time.   ?? If you worry when you lie down, start a worry book. Well before bedtime, write down your worries, and then set the book and your concerns aside.   ?? Try meditation or other relaxation techniques before you go to bed.   ?? If you cannot fall asleep, get up and go to another room until you feel sleepy. Do something relaxing. Repeat your bedtime routine before you go to bed again.   ?? Make your house quiet and calm about an hour before bedtime. Turn down the lights, turn off the TV, log off the computer, and turn down the volume on music. This can help you relax after a busy day.  When should you call for help?  Watch closely for changes in your health, and be sure to contact your doctor if:  ?? Your efforts to improve your sleep do not work.   ?? Your insomnia gets worse.   ?? You fall asleep during the day.    Where can you learn more?   Go to http://www.healthwise.net/[facility]  Enter P513 in   the search box to learn more about "Insomnia: After Your Visit."   ?? 1995-2010 Healthwise, Incorporated. Care instructions adapted under license by [employer] (which disclaims liability or warranty for this information). This care instruction is for use with your licensed healthcare professional. If you have questions about a medical condition or this instruction, always ask your healthcare professional. Healthwise disclaims any warranty or liability for your use of this information.  Content Version: 8.8.72453; Last Revised: August 19, 2008

## 2009-07-17 NOTE — Progress Notes (Signed)
Chief Complaint   Patient presents with   ??? Cholesterol Problem     high chol   ??? CHF   ??? Hypertension

## 2009-07-30 NOTE — Telephone Encounter (Signed)
Scheduled to see Dr. Hart Rochester (GI) on 07/31/09 at 10:30 am

## 2009-08-03 MED ORDER — SIMVASTATIN 20 MG TAB
20 mg | ORAL_TABLET | Freq: Every evening | ORAL | Status: DC
Start: 2009-08-03 — End: 2010-03-12

## 2009-10-17 NOTE — Progress Notes (Signed)
Chief Complaint   Patient presents with   ??? Hypertension   ??? Cholesterol Problem     high chol   ??? Results     discuss test results

## 2009-10-17 NOTE — Progress Notes (Signed)
Chief Complaint   Patient presents with   ??? Hypertension   ??? Cholesterol Problem     high chol   ??? Results     discuss test results       HISTORY OF THE PRESENT ILLNESS:  Cathy [name] is a 59 y.o. female      Past Medical History   Diagnosis Date   ??? CHF (congestive heart failure) 06/23/2008   ??? 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 Fibroid 06/23/2008   ??? HTN (hypertension) 06/23/2008   ??? History of elevated lipids 06/23/2008   ??? Breast mass, left      benign   ??? OSA (obstructive sleep apnea) 9/10     on cpap       Family History   Problem Relation Age of Onset   ??? Diabetes Mother    ??? Heart Attack Father      MI   ??? Heart Disease Maternal Grandmother    ??? Heart Disease Paternal Grandmother    ??? Kidney Disease Other      1 sib deceased   ??? Cancer Other      1 sib throat cancer   ??? Diabetes Other    ??? Diabetes Daughter    ??? Other Daughter      leukemia       History   Social History   ??? Marital Status: Married     Spouse Name: N/A     Number of Children: N/A   ??? 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 Smoking status: Never Smoker    ??? Smokeless tobacco: Never Used   ??? Alcohol Use: No   ??? Drug Use: No   ??? Sexually Active: Not on file   Other Topics Concern   ??? Not on file   Social History Narrative   ??? No narrative on file       MEDICATIONS:  Current outpatient prescriptions   Medication Sig   ??? aspirin 81 mg chewable tablet Take 81 mg by mouth daily.   ??? simvastatin (ZOCOR) 20 mg tablet Take 1 Tab by mouth nightly.   ??? carvedilol (COREG) 6.25 mg tablet Take 6.25 mg by mouth daily.       Medications Discontinued During This Encounter   Medication Reason   ??? chlorzoxazone (PARAFON FORTE DSC) 500 mg tablet    ??? diclofenac EC (VOLTAREN) 75 mg EC tablet    ??? hydrocodone-acetaminophen (VICODIN) 5-500 mg per tablet        Allergies   Allergen Reactions   ??? Pcn (Penicillins) Itching     Itching bumps on hands and arms        REVIEW OF SYSTEMS:  [facility]:    No Chest pain or Palpitations  Respiratory:  No Shortness of breath or cough  PHYSICAL EXAMINATION:  Constitutional: She is oriented. She appears well-developed and well-nourished.   BP 120/78   Pulse 85   Temp(Src) 96.9 ??F (36.1 ??C) (Oral)   Ht 5\' 7"  (1.702 m)   Wt 210 lb 12 oz (95.596 kg)   BMI 33.01 kg/m2  HENT: ??  Head: Normocephalic and atraumatic.   Nose: Nose normal. ??  Mouth/Throat: Oropharynx is clear and moist. No oropharyngeal exudate.   Eyes: Conjunctivae and extraocular motions are normal. Pupils are equal, round, and reactive to light. Right  eye exhibits no discharge. Left eye exhibits no discharge. No scleral icterus.   Neck: Neck supple. No thyromegaly present. She has no cervical adenopathy.   Cardiovascular: Normal rate, regular rhythm and normal heart sounds.?? Exam reveals no gallop and no friction rub.?? ??  No murmur heard.  Extremities no edema.  Pulmonary/Chest: Effort normal and breath sounds normal. No respiratory distress. She has no wheezes. She has no rales.   Abdominal: Soft. Bowel sounds are normal. She exhibits no distension and no mass. No tenderness. She has no rebound and no guarding.     LABORATORY AND IMAGING DATA:  Results for orders placed in visit on 06/20/09   TSH, 3RD GENERATION   Component Value Range   ??? TSH, 3rd generation 1.320  0.450 - 4.500 (uIU/mL)   T4, FREE   Component Value Range   ??? T4, Free 1.04  0.82 - 1.77 (ng/dL)         ASSESSMENT AND PLAN:    1. Leg pain (729.5H)    2. Varicose vein (454.9U)    3. HTN (hypertension) (401.9AF)    4. Hyperlipidemia (272.4S)        Problem 1 is controlled   Problem 2 is controlled  Problem 3 is controlled   Orders Placed This Encounter   ??? Lipid panel   ??? Metabolic panel, basic   ??? Ck   ??? Ast   ??? Alt   ??? Tsh, 3rd generation   ??? Aspirin 81 mg chewable tablet         continue current, medication changes and labs as above

## 2009-12-19 NOTE — Progress Notes (Signed)
Chief Complaint   Patient presents with   ??? Immunization/Injection     flu vaccine

## 2010-02-07 LAB — AST: AST (SGOT): 12 IU/L (ref 0–40)

## 2010-02-07 LAB — METABOLIC PANEL, BASIC
BUN/Creatinine ratio: 21 (ref 9–23)
BUN: 12 mg/dL (ref 6–24)
CO2: 23 mmol/L (ref 20–32)
Calcium: 8.5 mg/dL — ABNORMAL LOW (ref 8.7–10.2)
Chloride: 102 mmol/L (ref 97–108)
Creatinine: 0.56 mg/dL — ABNORMAL LOW (ref 0.57–1.00)
GFR est AA: 118 mL/min/{1.73_m2} (ref >59)
GFR est non-AA: 102 mL/min/{1.73_m2} (ref >59)
Glucose: 76 mg/dL (ref 65–99)
Potassium: 4.2 mmol/L (ref 3.5–5.2)
Sodium: 139 mmol/L (ref 135–145)

## 2010-02-07 LAB — LIPID PANEL
Cholesterol, total: 175 mg/dL (ref 100–199)
HDL Cholesterol: 61 mg/dL (ref >39)
LDL, calculated: 105 mg/dL — ABNORMAL HIGH (ref 0–99)
Triglyceride: 43 mg/dL (ref 0–149)
VLDL, calculated: 9 mg/dL (ref 5–40)

## 2010-02-07 LAB — ALT: ALT (SGPT): 18 IU/L (ref 0–40)

## 2010-02-07 LAB — CK: Creatine Kinase,Total: 35 U/L (ref 24–173)

## 2010-02-07 LAB — TSH 3RD GENERATION: TSH: 1.59 u[IU]/mL (ref 0.450–4.500)

## 2010-02-08 NOTE — Progress Notes (Signed)
Chief Complaint   Patient presents with   ??? Hypertension   ??? Cholesterol Problem     high chol   ??? Results     discuss lab results

## 2010-02-11 NOTE — Progress Notes (Signed)
Chief Complaint   Patient presents with   ??? Hypertension   ??? Cholesterol Problem     high chol   ??? Results     discuss lab results       HISTORY OF THE PRESENT ILLNESS:  Cathy [name] is a 59 y.o. female      Past Medical History   Diagnosis Date   ??? CHF (congestive heart failure) 06/23/2008   ??? 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 Fibroid 06/23/2008   ??? HTN (hypertension) 06/23/2008   ??? History of elevated lipids 06/23/2008   ??? Breast mass, left      benign   ??? OSA (obstructive sleep apnea) 9/10     on cpap       Family History   Problem Relation Age of Onset   ??? Diabetes Mother    ??? Heart Attack Father      MI   ??? Heart Disease Maternal Grandmother    ??? Heart Disease Paternal Grandmother    ??? Kidney Disease Other      1 sib deceased   ??? Cancer Other      1 sib throat cancer   ??? Diabetes Other    ??? Diabetes Daughter    ??? Other Daughter      leukemia       History   Social History   ??? Marital Status: Married     Spouse Name: N/A     Number of Children: N/A   ??? 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 Smoking status: Never Smoker    ??? Smokeless tobacco: Never Used   ??? Alcohol Use: No   ??? Drug Use: No   ??? Sexually Active: Not on file   Other Topics Concern   ??? Not on file   Social History Narrative   ??? No narrative on file       MEDICATIONS:  Current outpatient prescriptions   Medication Sig   ??? aspirin 81 mg chewable tablet Take 81 mg by mouth daily.   ??? simvastatin (ZOCOR) 20 mg tablet Take 1 Tab by mouth nightly.   ??? carvedilol (COREG) 6.25 mg tablet Take 6.25 mg by mouth daily.       There are no discontinued medications.  Allergies   Allergen Reactions   ??? Pcn (Penicillins) Itching     Itching bumps on hands and arms       REVIEW OF SYSTEMS:  CVS:    No Chest pain or Palpitations  Respiratory:  No Shortness of breath or cough  PHYSICAL EXAMINATION:   Constitutional: She is oriented. She appears well-developed and well-nourished.   BP 113/72   Pulse 79   Temp(Src) 97.4 ??F (36.3 ??C) (Oral)   Ht 5\' 7"  (1.702 m)   Wt 213 lb 4 oz (96.73 kg)   BMI 33.40 kg/m2  HENT: ??  Head: Normocephalic and atraumatic.   Nose: Nose normal. ??  Mouth/Throat: Oropharynx is clear and moist. No oropharyngeal exudate.   Eyes: Conjunctivae and extraocular motions are normal. Pupils are equal, round, and reactive to light. Right eye exhibits no discharge. Left eye exhibits no discharge. No scleral icterus.   Neck: Neck supple. No thyromegaly present. She has no cervical adenopathy.   Cardiovascular: Normal rate, regular rhythm and normal heart sounds.?? Exam reveals no gallop and  no friction rub.?? ??  No murmur heard.  Extremities no edema.  Pulmonary/Chest: Effort normal and breath sounds normal. No respiratory distress. She has no wheezes. She has no rales.   Abdominal: Soft. Bowel sounds are normal. She exhibits no distension and no mass. No tenderness. She has no rebound and no guarding.     LABORATORY AND IMAGING DATA:  Results for orders placed in visit on 10/17/09   LIPID PANEL   Component Value Range   ??? Cholesterol, total 175  100 - 199 (mg/dL)   ??? Triglyceride 43  0 - 149 (mg/dL)   ??? HDL Cholesterol 61  >39 (mg/dL)   ??? VLDL, calculated 9  5 - 40 (mg/dL)   ??? LDL, calculated 105 (*) 0 - 99 (mg/dL)   METABOLIC PANEL, BASIC   Component Value Range   ??? Glucose 76  65 - 99 (mg/dL)   ??? BUN 12  6 - 24 (mg/dL)   ??? Creatinine 0.56 (*) 0.57 - 1.00 (mg/dL)   ??? GFR est non-AA 102  >59 (mL/min/1.73)   ??? GFR est AA 118  >59 (mL/min/1.73)   ??? BUN/Creatinine ratio 21  9 - 23    ??? Sodium 139  135 - 145 (mmol/L)   ??? Potassium 4.2  3.5 - 5.2 (mmol/L)   ??? Chloride 102  97 - 108 (mmol/L)   ??? CO2 23  20 - 32 (mmol/L)   ??? Calcium 8.5 (*) 8.7 - 10.2 (mg/dL)   CK   Component Value Range   ??? Creatine Kinase,Total 35  24 - 173 (U/L)   AST   Component Value Range   ??? AST 12  0 - 40 (IU/L)   ALT    Component Value Range   ??? ALT 18  0 - 40 (IU/L)   TSH, 3RD GENERATION   Component Value Range   ??? TSH, 3rd generation 1.590  0.450 - 4.500 (uIU/mL)         ASSESSMENT AND PLAN:    1. Leg pain (729.5H)    2. HTN (hypertension) (401.9AF)    3. Hyperlipidemia (272.4S)        Problem 1 is controlled   Problem 2 is controlled  Problem 3 is controlled   No orders of the defined types were placed in this encounter.       continue current, medication changes and labs as above

## 2010-03-12 MED ORDER — SIMVASTATIN 20 MG TAB
20 mg | ORAL_TABLET | ORAL | Status: DC
Start: 2010-03-12 — End: 2011-06-12

## 2010-04-06 MED ORDER — SIMVASTATIN 20 MG TAB
20 mg | ORAL_TABLET | ORAL | Status: DC
Start: 2010-04-06 — End: 2010-04-22

## 2010-04-22 MED ORDER — DIPHENHYDRAMINE 25 MG CAP
25 mg | ORAL_CAPSULE | Freq: Four times a day (QID) | ORAL | Status: DC | PRN
Start: 2010-04-22 — End: 2010-04-30

## 2010-04-22 MED ORDER — TRIMETHOPRIM-SULFAMETHOXAZOLE 160 MG-800 MG TAB
160-800 mg | ORAL_TABLET | Freq: Two times a day (BID) | ORAL | Status: DC
Start: 2010-04-22 — End: 2010-04-30

## 2010-04-22 NOTE — Progress Notes (Signed)
Chief Complaint   Patient presents with   ??? Facial Swelling     patient states started saturday night, 04/20/10   ??? Facial Pain     patient states started friday, 04/19/10

## 2010-04-22 NOTE — Progress Notes (Signed)
Chief Complaint   Patient presents with   ??? Facial Swelling     patient states started saturday night, 04/20/10   ??? Facial Pain     patient states started friday, 04/19/10     HISTORY OF THE PRESENT ILLNESS:  Cathy [name] is a 60 y.o. female    Face pain when smiles  Swelling 2 days  Better niw  Not  A lot of teeth to be infected    Past Medical History   Diagnosis Date   ??? CHF (congestive heart failure) 06/23/2008   ??? 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 Fibroid 06/23/2008   ??? HTN (hypertension) 06/23/2008   ??? History of elevated lipids 06/23/2008   ??? Breast mass, left      benign   ??? OSA (obstructive sleep apnea) 9/10     on cpap     Family History   Problem Relation Age of Onset   ??? Diabetes Mother    ??? Heart Attack Father      MI   ??? Heart Disease Maternal Grandmother    ??? Heart Disease Paternal Grandmother    ??? Kidney Disease Other      1 sib deceased   ??? Cancer Other      1 sib throat cancer   ??? Diabetes Other    ??? Diabetes Daughter    ??? Other Daughter      leukemia     History     Social History   ??? Marital Status: Married     Spouse Name: N/A     Number of Children: N/A   ??? 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 Smoking status: Never Smoker    ??? Smokeless tobacco: Never Used   ??? Alcohol Use: No   ??? Drug Use: No   ??? Sexually Active: Not on file     Other Topics Concern   ??? Not on file     Social History Narrative   ??? No narrative on file     MEDICATIONS:  Current Outpatient Prescriptions   Medication Sig   ??? trimethoprim-sulfamethoxazole (BACTRIM DS, SEPTRA DS) 160-800 mg per tablet Take 1 Tab by mouth two (2) times a day for 10 days.   ??? diphenhydrAMINE (BENADRYL) 25 mg capsule Take 1 Cap by mouth every six (6) hours as needed for 10 days.   ??? simvastatin (ZOCOR) 20 mg tablet TAKE ONE TABLET BY MOUTH NIGHTLY.   ??? aspirin 81 mg chewable tablet Take 81 mg by mouth daily.    ??? carvedilol (COREG) 6.25 mg tablet Take 6.25 mg by mouth daily.     Medications Discontinued During This Encounter   Medication Reason   ??? simvastatin (ZOCOR) 20 mg tablet Duplicate Order     Allergies   Allergen Reactions   ??? Pcn (Penicillins) Itching     Itching bumps on hands and arms     REVIEW OF SYSTEMS:  CVS:    No Chest pain or Palpitations  Respiratory:  No Shortness of breath or cough  PHYSICAL EXAMINATION:  Constitutional: She is oriented. She appears well-developed and well-nourished.   BP 124/79   Pulse 82   Temp(Src) 97.9 ??F (36.6 ??C) (Oral)   Ht 5\' 7"  (1.702 m)   Wt 211 lb (95.709 kg)   BMI  33.05 kg/m2  HENT: ??  Head: Normocephalic and atraumatic.   Nose: Nose normal. ??  Mouth/Throat: Oropharynx is clear and moist. No oropharyngeal exudate.   Eyes: Conjunctivae and extraocular motions are normal. Pupils are equal, round, and reactive to light. Right eye exhibits no discharge. Left eye exhibits no discharge. No scleral icterus.   Neck: Neck supple. No thyromegaly present. She has no cervical adenopathy.   Cardiovascular: Normal rate, regular rhythm and normal heart sounds.?? Exam reveals no gallop and no friction rub.?? ??  No murmur heard.  Extremities no edema.  Pulmonary/Chest: Effort normal and breath sounds normal. No respiratory distress. She has no wheezes. She has no rales.   Abdominal: Soft. Bowel sounds are normal. She exhibits no distension and no mass. No tenderness. She has no rebound and no guarding.   Left face swollen and warm    LABORATORY AND IMAGING DATA:  Results for orders placed in visit on 10/17/09   LIPID PANEL       Component Value Range    Cholesterol, total 175  100 - 199 (mg/dL)    Triglyceride 43  0 - 149 (mg/dL)    HDL Cholesterol 61  >39 (mg/dL)    VLDL, calculated 9  5 - 40 (mg/dL)    LDL, calculated 960 (*) 0 - 99 (mg/dL)   METABOLIC PANEL, BASIC       Component Value Range    Glucose 76  65 - 99 (mg/dL)    BUN 12  6 - 24 (mg/dL)     Creatinine 4.54 (*) 0.57 - 1.00 (mg/dL)    GFR est non-AA 098  >59 (mL/min/1.73)    GFR est AA 118  >59 (mL/min/1.73)    BUN/Creatinine ratio 21  9 - 23     Sodium 139  135 - 145 (mmol/L)    Potassium 4.2  3.5 - 5.2 (mmol/L)    Chloride 102  97 - 108 (mmol/L)    CO2 23  20 - 32 (mmol/L)    Calcium 8.5 (*) 8.7 - 10.2 (mg/dL)   CK       Component Value Range    Creatine Kinase,Total 35  24 - 173 (U/L)   AST       Component Value Range    AST 12  0 - 40 (IU/L)   ALT       Component Value Range    ALT 18  0 - 40 (IU/L)   TSH, 3RD GENERATION       Component Value Range    TSH, 3rd generation 1.590  0.450 - 4.500 (uIU/mL)       ASSESSMENT AND PLAN:    1. Swelling, mass, or lump on face      uncontrolled  Orders Placed This Encounter   ??? CBC WITH AUTOMATED DIFF   ??? METABOLIC PANEL, BASIC   ??? SED RATE (ESR)   ??? trimethoprim-sulfamethoxazole (BACTRIM DS, SEPTRA DS) 160-800 mg per tablet   ??? diphenhydrAMINE (BENADRYL) 25 mg capsule     See dentist   rtc in 2 days xray face patient wants to hold off    continue current, medication changes and labs as above

## 2010-04-26 LAB — CBC WITH AUTOMATED DIFF
ABS. BASOPHILS: 0 10*3/uL (ref 0.0–0.2)
ABS. EOSINOPHILS: 0 10*3/uL (ref 0.0–0.4)
ABS. IMM. GRANS.: 0 10*3/uL (ref 0.0–0.1)
ABS. LYMPHOCYTES: 1.2 10*3/uL (ref 0.7–4.5)
ABS. MONOCYTES: 0.3 10*3/uL (ref 0.1–1.0)
ABS. NEUTROPHILS: 2.3 10*3/uL (ref 1.8–7.8)
BASOPHILS: 0 % (ref 0–3)
EOSINOPHILS: 1 % (ref 0–7)
HCT: 37.9 % (ref 34.0–44.0)
HGB: 12.3 g/dL (ref 11.5–15.0)
IMMATURE GRANULOCYTES: 0 % (ref 0–2)
LYMPHOCYTES: 32 % (ref 14–46)
MCH: 30 pg (ref 27.0–34.0)
MCHC: 32.5 g/dL (ref 32.0–36.0)
MCV: 92 fL (ref 80–98)
MONOCYTES: 9 % (ref 4–13)
NEUTROPHILS: 58 % (ref 40–74)
PLATELET: 270 10*3/uL (ref 140–415)
RBC: 4.1 x10E6/uL (ref 3.80–5.10)
RDW: 14.3 % (ref 11.7–15.0)
WBC: 3.9 10*3/uL — ABNORMAL LOW (ref 4.0–10.5)

## 2010-04-26 LAB — METABOLIC PANEL, BASIC
BUN/Creatinine ratio: 18 (ref 11–26)
BUN: 13 mg/dL (ref 8–27)
CO2: 23 mmol/L (ref 20–32)
Calcium: 8.8 mg/dL (ref 8.6–10.2)
Chloride: 107 mmol/L (ref 97–108)
Creatinine: 0.73 mg/dL (ref 0.57–1.00)
GFR est AA: 104 mL/min/{1.73_m2} (ref >59)
GFR est non-AA: 90 mL/min/{1.73_m2} (ref >59)
Glucose: 88 mg/dL (ref 65–99)
Potassium: 5.1 mmol/L (ref 3.5–5.2)
Sodium: 143 mmol/L (ref 134–144)

## 2010-04-26 LAB — SED RATE (ESR): Sed rate (ESR): 27 mm/hr (ref 0–40)

## 2010-04-26 LAB — PLEASE NOTE

## 2010-04-30 NOTE — Progress Notes (Signed)
Chief Complaint   Patient presents with   ??? Results     discuss lab results

## 2010-05-18 NOTE — Progress Notes (Signed)
Chief Complaint   Patient presents with   ??? Results     discuss lab results     HISTORY OF THE PRESENT ILLNESS:  Cathy [name] is a 60 y.o. female        Past Medical History   Diagnosis Date   ??? CHF (congestive heart failure) 06/23/2008   ??? 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 Fibroid 06/23/2008   ??? HTN (hypertension) 06/23/2008   ??? History of elevated lipids 06/23/2008   ??? Breast mass, left      benign   ??? OSA (obstructive sleep apnea) 9/10     on cpap     Family History   Problem Relation Age of Onset   ??? Diabetes Mother    ??? Heart Attack Father      MI   ??? Heart Disease Maternal Grandmother    ??? Heart Disease Paternal Grandmother    ??? Kidney Disease Other      1 sib deceased   ??? Cancer Other      1 sib throat cancer   ??? Diabetes Other    ??? Diabetes Daughter    ??? Other Daughter      leukemia     History     Social History   ??? Marital Status: Married     Spouse Name: N/A     Number of Children: N/A   ??? 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 Smoking status: Never Smoker    ??? Smokeless tobacco: Never Used   ??? Alcohol Use: No   ??? Drug Use: No   ??? Sexually Active: Not on file     Other Topics Concern   ??? Not on file     Social History Narrative   ??? No narrative on file     MEDICATIONS:  Current Outpatient Prescriptions   Medication Sig   ??? simvastatin (ZOCOR) 20 mg tablet TAKE ONE TABLET BY MOUTH NIGHTLY.   ??? aspirin 81 mg chewable tablet Take 81 mg by mouth daily.   ??? carvedilol (COREG) 6.25 mg tablet Take 6.25 mg by mouth daily.     Medications Discontinued During This Encounter   Medication Reason   ??? diphenhydrAMINE (BENADRYL) 25 mg capsule    ??? trimethoprim-sulfamethoxazole (BACTRIM DS, SEPTRA DS) 160-800 mg per tablet      Allergies   Allergen Reactions   ??? Pcn (Penicillins) Itching     Itching bumps on hands and arms     REVIEW OF SYSTEMS:    CVS:    No chest pain or palpitations   Respiratory:  No shortness of breath or cough    PHYSICAL EXAMINATION:  Constitutional: He is oriented. He appears well-developed and well-nourished.   BP 112/73   Pulse 83   Temp(Src) 97.5 ??F (36.4 ??C) (Oral)   Ht 5\' 7"  (1.702 m)   Wt 211 lb 4 oz (95.822 kg)   BMI 33.09 kg/m2  HENT: ??  Head: Normocephalic and atraumatic.   Nose: Nose normal. ??  Mouth/Throat: Oropharynx is clear and moist. No oropharyngeal exudate.   Eyes: Conjunctivae and extraocular motions are normal. Pupils are equal, round, and reactive to light. Right eye exhibits no discharge. Left eye exhibits no discharge. No scleral icterus.   Neck: Neck supple. No thyromegaly present. He has no  cervical adenopathy.   Cardiovascular: Normal rate, regular rhythm and normal heart sounds.?? Exam reveals no gallop and no friction rub.?? ??      LABORATORY AND IMAGING DATA:  Results for orders placed in visit on 04/22/10   CBC WITH AUTOMATED DIFF       Component Value Range    WBC 3.9 (*) 4.0 - 10.5 (x10E3/uL)    RBC 4.10  3.80 - 5.10 (x10E6/uL)    HGB 12.3  11.5 - 15.0 (g/dL)    HCT 96.0  45.4 - 09.8 (%)    MCV 92  80 - 98 (fL)    MCH 30.0  27.0 - 34.0 (pg)    MCHC 32.5  32.0 - 36.0 (g/dL)    RDW 11.9  14.7 - 82.9 (%)    PLATELET 270  140 - 415 (x10E3/uL)    NEUTROPHILS 58  40 - 74 (%)    LYMPHOCYTES 32  14 - 46 (%)    MONOCYTES 9  4 - 13 (%)    EOSINOPHILS 1  0 - 7 (%)    BASOPHILS 0  0 - 3 (%)    ABS. NEUTROPHILS 2.3  1.8 - 7.8 (x10E3/uL)    ABS. LYMPHOCYTES 1.2  0.7 - 4.5 (x10E3/uL)    ABS. MONOCYTES 0.3  0.1 - 1.0 (x10E3/uL)    ABS. EOSINOPHILS 0.0  0.0 - 0.4 (x10E3/uL)    ABS. BASOPHILS 0.0  0.0 - 0.2 (x10E3/uL)    IMMATURE GRANULOCYTES 0  0 - 2 (%)    ABS. IMM. GRANS. 0.0  0.0 - 0.1 (x10E3/uL)   METABOLIC PANEL, BASIC       Component Value Range    Glucose 88  65 - 99 (mg/dL)    BUN 13  8 - 27 (mg/dL)    Creatinine 5.62  1.30 - 1.00 (mg/dL)    GFR est non-AA 90  >59 (mL/min/1.73)    GFR est AA 104  >59 (mL/min/1.73)    BUN/Creatinine ratio 18  11 - 26      Sodium 143  134 - 144 (mmol/L)    Potassium 5.1  3.5 - 5.2 (mmol/L)    Chloride 107  97 - 108 (mmol/L)    CO2 23  20 - 32 (mmol/L)    Calcium 8.8  8.6 - 10.2 (mg/dL)   SED RATE (ESR)       Component Value Range    Sed rate (ESR) 27  0 - 40 (mm/hr)   PLEASE NOTE       Component Value Range    Please note: Comment         ASSESSMENT AND PLAN:    Facial swelling better    continue current, medication changes and labs as above

## 2010-08-09 MED ORDER — DICLOFENAC 75 MG TAB, DELAYED RELEASE
75 mg | ORAL_TABLET | Freq: Every day | ORAL | Status: DC
Start: 2010-08-09 — End: 2010-10-17

## 2010-08-09 MED ORDER — TRAMADOL-ACETAMINOPHEN 37.5 MG-325 MG TAB
ORAL_TABLET | Freq: Every evening | ORAL | Status: DC
Start: 2010-08-09 — End: 2010-10-17

## 2010-08-09 MED ORDER — CAPSAICIN 0.1 % TOPICAL CREAM
0.1 % | Freq: Every day | CUTANEOUS | Status: DC
Start: 2010-08-09 — End: 2010-10-17

## 2010-08-09 NOTE — Progress Notes (Signed)
Chief Complaint   Patient presents with   ??? Cholesterol Problem     high chol   ??? Hypertension

## 2010-08-13 NOTE — Telephone Encounter (Signed)
Carlynn Herald, MD - RE: [name] << Less Detail       RE: [name], MD         Sent: Tue August 13, 2010  9:52 PM     To: [name]; [name]     Cc: Carlynn Herald, MD          Cathy [name]     MRN: [id number] DOB: 03/05/1950     Pt Home: [phone]         Entered: [phone]                     Message      Please [name] Tell her to check with cardiologist and to make sure to tell him it is only for a few days  ----- Message -----     From: [name]     Sent: 08/13/2010   2:51 PM       To: Carlynn Herald, MD, [name]  Subject: [name] states that you prescribed Diclofenac 75 mg and that the pharmacy said she should not take this because of her heart problems. The patient is taking Coreg.  She wants to know what she should do.  [facility] [phone]

## 2010-08-14 NOTE — Progress Notes (Signed)
Chief Complaint   Patient presents with   ??? Cholesterol Problem     high chol   ??? Hypertension     HISTORY OF THE PRESENT ILLNESS:  Cathy [name] is a 60 y.o. female      Past Medical History   Diagnosis Date   ??? CHF (congestive heart failure) 06/23/2008   ??? 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 Fibroid 06/23/2008   ??? HTN (hypertension) 06/23/2008   ??? History of elevated lipids 06/23/2008   ??? Breast mass, left      benign   ??? OSA (obstructive sleep apnea) 9/10     on cpap     Family History   Problem Relation Age of Onset   ??? Diabetes Mother    ??? Heart Attack Father      MI   ??? Heart Disease Maternal Grandmother    ??? Heart Disease Paternal Grandmother    ??? Kidney Disease Other      1 sib deceased   ??? Cancer Other      1 sib throat cancer   ??? Diabetes Other    ??? Diabetes Daughter    ??? Other Daughter      leukemia     History     Social History   ??? Marital Status: Married     Spouse Name: N/A     Number of Children: N/A   ??? 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 Smoking status: Never Smoker    ??? Smokeless tobacco: Never Used   ??? Alcohol Use: No   ??? Drug Use: No   ??? Sexually Active: Not on file     Other Topics Concern   ??? Not on file     Social History Narrative   ??? No narrative on file     MEDICATIONS:  Current Outpatient Prescriptions   Medication Sig   ??? diclofenac EC (VOLTAREN) 75 mg EC tablet Take 1 Tab by mouth daily.   ??? traMADol-acetaminophen (ULTRACET) 37.5-325 mg per tablet Take 1 Tab by mouth every evening.   ??? capsaicin 0.1 % topical cream Apply  to affected area daily.   ??? simvastatin (ZOCOR) 20 mg tablet TAKE ONE TABLET BY MOUTH NIGHTLY.   ??? aspirin 81 mg chewable tablet Take 81 mg by mouth daily.   ??? carvedilol (COREG) 6.25 mg tablet Take 6.25 mg by mouth daily.     There are no discontinued medications.  Allergies   Allergen Reactions   ??? Pcn (Penicillins) Itching      Itching bumps on hands and arms     REVIEW OF SYSTEMS:  [facility]:    No Chest pain or Palpitations  Respiratory:  No Shortness of breath or cough  PHYSICAL EXAMINATION:  Constitutional: She is oriented. She appears well-developed and well-nourished.   BP 125/78   Pulse 74   Temp(Src) 97.5 ??F (36.4 ??C) (Oral)   Ht 5\' 7"  (1.702 m)   Wt 211 lb (95.709 kg)   BMI 33.05 kg/m2  HENT: ??  Head: Normocephalic and atraumatic.   Nose: Nose normal. ??  Mouth/Throat: Oropharynx is clear and moist. No oropharyngeal exudate.   Eyes: Conjunctivae and extraocular motions are normal. Pupils are equal, round, and reactive to light. Right eye exhibits no discharge. Left eye exhibits no discharge. No  scleral icterus.   Neck: Neck supple. No thyromegaly present. She has no cervical adenopathy.   Cardiovascular: Normal rate, regular rhythm and normal heart sounds.?? Exam reveals no gallop and no friction rub.?? ??  No murmur heard.  Extremities no edema.  Pulmonary/Chest: Effort normal and breath sounds normal. No respiratory distress. She has no wheezes. She has no rales.   Abdominal: Soft. Bowel sounds are normal. She exhibits no distension and no mass. No tenderness. She has no rebound and no guarding.     LABORATORY AND IMAGING DATA:  Results for orders placed in visit on 04/22/10   CBC WITH AUTOMATED DIFF       Component Value Range    WBC 3.9 (*) 4.0 - 10.5 (x10E3/uL)    RBC 4.10  3.80 - 5.10 (x10E6/uL)    HGB 12.3  11.5 - 15.0 (g/dL)    HCT 14.7  82.9 - 56.2 (%)    MCV 92  80 - 98 (fL)    MCH 30.0  27.0 - 34.0 (pg)    MCHC 32.5  32.0 - 36.0 (g/dL)    RDW 13.0  86.5 - 78.4 (%)    PLATELET 270  140 - 415 (x10E3/uL)    NEUTROPHILS 58  40 - 74 (%)    LYMPHOCYTES 32  14 - 46 (%)    MONOCYTES 9  4 - 13 (%)    EOSINOPHILS 1  0 - 7 (%)    BASOPHILS 0  0 - 3 (%)    ABS. NEUTROPHILS 2.3  1.8 - 7.8 (x10E3/uL)    ABS. LYMPHOCYTES 1.2  0.7 - 4.5 (x10E3/uL)    ABS. MONOCYTES 0.3  0.1 - 1.0 (x10E3/uL)    ABS. EOSINOPHILS 0.0  0.0 - 0.4 (x10E3/uL)     ABS. BASOPHILS 0.0  0.0 - 0.2 (x10E3/uL)    IMMATURE GRANULOCYTES 0  0 - 2 (%)    ABS. IMM. GRANS. 0.0  0.0 - 0.1 (x10E3/uL)   METABOLIC PANEL, BASIC       Component Value Range    Glucose 88  65 - 99 (mg/dL)    BUN 13  8 - 27 (mg/dL)    Creatinine 6.96  2.95 - 1.00 (mg/dL)    GFR est non-AA 90  >59 (mL/min/1.73)    GFR est AA 104  >59 (mL/min/1.73)    BUN/Creatinine ratio 18  11 - 26     Sodium 143  134 - 144 (mmol/L)    Potassium 5.1  3.5 - 5.2 (mmol/L)    Chloride 107  97 - 108 (mmol/L)    CO2 23  20 - 32 (mmol/L)    Calcium 8.8  8.6 - 10.2 (mg/dL)   SED RATE (ESR)       Component Value Range    Sed rate (ESR) 27  0 - 40 (mm/hr)   PLEASE NOTE       Component Value Range    Please note: Comment         ASSESSMENT AND PLAN:    1. Leg pain      Uncontrolled    Orders Placed This Encounter   ??? REFERRAL TO ORTHOPEDICS   ??? diclofenac EC (VOLTAREN) 75 mg EC tablet   ??? traMADol-acetaminophen (ULTRACET) 37.5-325 mg per tablet   ??? capsaicin 0.1 % topical cream       continue current, medication changes and labs as above

## 2010-08-16 NOTE — Progress Notes (Signed)
Chief Complaint   Patient presents with   ??? Follow-up     to previous visit on leg pain

## 2010-08-24 NOTE — Progress Notes (Signed)
Chief Complaint   Patient presents with   ??? Follow-up     to previous visit on leg pain     HISTORY OF THE PRESENT ILLNESS:  Cathy [name] is a 60 y.o. female      Past Medical History   Diagnosis Date   ??? CHF (congestive heart failure) 06/23/2008   ??? 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 Fibroid 06/23/2008   ??? HTN (hypertension) 06/23/2008   ??? History of elevated lipids 06/23/2008   ??? Breast mass, left      benign   ??? OSA (obstructive sleep apnea) 9/10     on cpap     Family History   Problem Relation Age of Onset   ??? Diabetes Mother    ??? Heart Attack Father      MI   ??? Heart Disease Maternal Grandmother    ??? Heart Disease Paternal Grandmother    ??? Kidney Disease Other      1 sib deceased   ??? Cancer Other      1 sib throat cancer   ??? Diabetes Other    ??? Diabetes Daughter    ??? Other Daughter      leukemia     History     Social History   ??? Marital Status: Married     Spouse Name: N/A     Number of Children: N/A   ??? 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 Smoking status: Never Smoker    ??? Smokeless tobacco: Never Used   ??? Alcohol Use: No   ??? Drug Use: No   ??? Sexually Active: Not on file     Other Topics Concern   ??? Not on file     Social History Narrative   ??? No narrative on file     MEDICATIONS:  Current Outpatient Prescriptions   Medication Sig   ??? diclofenac EC (VOLTAREN) 75 mg EC tablet Take 1 Tab by mouth daily.   ??? traMADol-acetaminophen (ULTRACET) 37.5-325 mg per tablet Take 1 Tab by mouth every evening.   ??? capsaicin 0.1 % topical cream Apply  to affected area daily.   ??? simvastatin (ZOCOR) 20 mg tablet TAKE ONE TABLET BY MOUTH NIGHTLY.   ??? aspirin 81 mg chewable tablet Take 81 mg by mouth daily.   ??? carvedilol (COREG) 6.25 mg tablet Take 6.25 mg by mouth daily.     There are no discontinued medications.  Allergies   Allergen Reactions   ??? Pcn (Penicillins) Itching     Itching bumps on hands and arms      REVIEW OF SYSTEMS:  CVS:    No Chest pain or Palpitations  Respiratory:  No Shortness of breath or cough  PHYSICAL EXAMINATION:  Constitutional: She is oriented. She appears well-developed and well-nourished.   BP 133/79   Pulse 90   Temp(Src) 96.7 ??F (35.9 ??C) (Oral)  HENT: ??  Head: Normocephalic and atraumatic.   Nose: Nose normal. ??  Mouth/Throat: Oropharynx is clear and moist. No oropharyngeal exudate.   Eyes: Conjunctivae and extraocular motions are normal. Pupils are equal, round, and reactive to light. Right eye exhibits no discharge. Left eye exhibits no discharge. No scleral icterus.   Neck: Neck supple. No thyromegaly present. She has no cervical adenopathy.   Cardiovascular: Normal rate,  regular rhythm and normal heart sounds.?? Exam reveals no gallop and no friction rub.?? ??  No murmur heard.  Extremities no edema.  Pulmonary/Chest: Effort normal and breath sounds normal. No respiratory distress. She has no wheezes. She has no rales.   Abdominal: Soft. Bowel sounds are normal. She exhibits no distension and no mass. No tenderness. She has no rebound and no guarding.     LABORATORY AND IMAGING DATA:  Results for orders placed in visit on 04/22/10   CBC WITH AUTOMATED DIFF       Component Value Range    WBC 3.9 (*) 4.0 - 10.5 (x10E3/uL)    RBC 4.10  3.80 - 5.10 (x10E6/uL)    HGB 12.3  11.5 - 15.0 (g/dL)    HCT 62.9  52.8 - 41.3 (%)    MCV 92  80 - 98 (fL)    MCH 30.0  27.0 - 34.0 (pg)    MCHC 32.5  32.0 - 36.0 (g/dL)    RDW 24.4  01.0 - 27.2 (%)    PLATELET 270  140 - 415 (x10E3/uL)    NEUTROPHILS 58  40 - 74 (%)    LYMPHOCYTES 32  14 - 46 (%)    MONOCYTES 9  4 - 13 (%)    EOSINOPHILS 1  0 - 7 (%)    BASOPHILS 0  0 - 3 (%)    ABS. NEUTROPHILS 2.3  1.8 - 7.8 (x10E3/uL)    ABS. LYMPHOCYTES 1.2  0.7 - 4.5 (x10E3/uL)    ABS. MONOCYTES 0.3  0.1 - 1.0 (x10E3/uL)    ABS. EOSINOPHILS 0.0  0.0 - 0.4 (x10E3/uL)    ABS. BASOPHILS 0.0  0.0 - 0.2 (x10E3/uL)    IMMATURE GRANULOCYTES 0  0 - 2 (%)     ABS. IMM. GRANS. 0.0  0.0 - 0.1 (x10E3/uL)   METABOLIC PANEL, BASIC       Component Value Range    Glucose 88  65 - 99 (mg/dL)    BUN 13  8 - 27 (mg/dL)    Creatinine 5.36  6.44 - 1.00 (mg/dL)    GFR est non-AA 90  >59 (mL/min/1.73)    GFR est AA 104  >59 (mL/min/1.73)    BUN/Creatinine ratio 18  11 - 26     Sodium 143  134 - 144 (mmol/L)    Potassium 5.1  3.5 - 5.2 (mmol/L)    Chloride 107  97 - 108 (mmol/L)    CO2 23  20 - 32 (mmol/L)    Calcium 8.8  8.6 - 10.2 (mg/dL)   SED RATE (ESR)       Component Value Range    Sed rate (ESR) 27  0 - 40 (mm/hr)   PLEASE NOTE       Component Value Range    Please note: Comment         ASSESSMENT AND PLAN:    1. Knee pain        Orders Placed This Encounter   ??? MRI LOWER EXT JOINT LEFT W/O CONT     15 min spent with pt more than half of which was spent counseling pt re disease process, medications and alternatives to medications, need for close FU.    continue current, medication changes and labs as above

## 2010-10-17 DIAGNOSIS — I429 Cardiomyopathy, unspecified: Secondary | ICD-10-CM | POA: Insufficient documentation

## 2010-10-17 NOTE — Progress Notes (Signed)
HPI:  This is a 60 year old, Caucasian female accompanied by her husband.  She is referred by Dr. Gracy Bruins for evaluation for possible AICD.  She has had a nonischemic cardiomyopathy with progressive decline in her left ventricular function for at least four years.  Most recent echocardiogram showed ejection fraction of 20%.  She has mild chronic exertional dyspnea.  No syncope.  No chest pain.  No PND, orthopnea, edema.  She has had some issues taking ACE inhibitor due to hypotension.  She has also had difficulty with beta blocker due to low heart rate.    Impression:  1. Nonischemic cardiomyopathy.  Ejection fraction of 20% and progressive decline since 2008.  Heart catheterization 2008 showed no disease.  Nuclear stress test 2010 showed no ischemia.  2. Class II congestive heart failure.  3. Dyslipidemia.  4. Intolerance to ACE inhibitor and beta blockers historically due to hypotension and bradycardia.  5. 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    I discussed risks, benefits, and alternatives to AICD implantation for primary prevention.  She would like to proceed.  Risks include but are not limited to that of pain, infection, stroke, pneumothorax, hemopericardium.  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  I did quote small chance of inappropriate shocks.  All questions were answered.      Past Medical History   Diagnosis Date   ??? CHF (congestive heart failure) 06/23/2008   ??? 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 Fibroid 06/23/2008   ??? HTN (hypertension) 06/23/2008   ??? History of elevated lipids 06/23/2008   ??? Breast mass, left      benign   ??? OSA (obstructive sleep apnea) 9/10       Current Outpatient Prescriptions   Medication Sig Dispense Refill   ??? aspirin (ASPIRIN) 325 mg tablet Take 325 mg by mouth every four (4) hours as needed.         ??? simvastatin (ZOCOR) 20 mg tablet TAKE ONE TABLET BY MOUTH NIGHTLY.  30 Tab  0    ??? carvedilol (COREG) 6.25 mg tablet Take 6.25 mg by mouth two (2) times a day.           Social History   reports that she has never smoked. She has never used smokeless tobacco.   reports that she does not drink alcohol.    Family History  family history includes Cancer in an other family member; Diabetes in her daughter, mother, and another family member; Heart Attack in her father; Heart Disease in her maternal grandmother and paternal grandmother; Kidney Disease in an other family member; and Other in her daughter.    Review of Systems  Except as stated above include:  Constitutional: Negative for fever, chills and malaise/fatigue.   HEENT: Negative.   Gastrointestinal: Negative.  Musculoskeletal: Negative.      PHYSICAL EXAM  BP Readings from Last 3 Encounters:   10/17/10 118/70   08/16/10 133/79   08/09/10 125/78     Pulse Readings from Last 3 Encounters:   10/17/10 93   08/16/10 90   08/09/10 74     Wt Readings from Last 3 Encounters:   10/17/10 96.616 kg (213 lb)   08/09/10 95.709 kg (211 lb)   04/30/10 95.822 kg (211 lb 4 oz)     General:  Alert and oriented to person, place, and time.  No acute distress.  Head and Neck:  No jugular venous distention or carotid bruits.  Lungs:  Clear bilaterally.  Heart:  Regular rate and rhythm.  Normal S1/S2.  No significant murmurs, rubs or gallops.  Abdomen:  Soft and nontender.  Extremities:  No significant edema.  Neurological:  Grossly normal.

## 2010-11-08 NOTE — H&P (Signed)
Office Visit    Cathy [name] (MRN [id number])           Reason for Visit      Cardiomyopathy     New patient referred by Dr. Allena Katz-- possible AICD; no cardiac complaints        Diagnoses      Cardiomyopathy   - Primary     425.4     CHF (congestive heart failure)      428.0            Progress Notes      Arletha Pili, MD  10/21/2010  9:39 AM  Signed  HPI:  This is a 60 year old, Caucasian female accompanied by her husband.  She is referred by Dr. Gracy Bruins for evaluation for possible AICD.  She has had a nonischemic cardiomyopathy with progressive decline in her left ventricular function for at least four years.  Most recent echocardiogram showed ejection fraction of 20%.  She has mild chronic exertional dyspnea.  No syncope.  No chest pain.  No PND, orthopnea, edema.  She has had some issues taking ACE inhibitor due to hypotension.  She has also had difficulty with beta blocker due to low heart rate.      Impression:  Nonischemic cardiomyopathy.  Ejection fraction of 20% and progressive decline since 2008.  Heart catheterization 2008 showed no disease.  Nuclear stress test 2010 showed no ischemia.  Class II congestive heart failure.  Dyslipidemia.  Intolerance to ACE inhibitor and beta blockers historically due to hypotension and bradycardia.  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      I discussed risks, benefits, and alternatives to AICD implantation for primary prevention.  She would like to proceed.  Risks include but are not limited to that of pain, infection, stroke, pneumothorax, hemopericardium.  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  I did quote small chance of inappropriate shocks.  All questions were answered.          Past Medical History    Diagnosis  Date    ???  CHF (congestive heart failure)  06/23/2008    ???  Hypothyroid hx goiter  06/23/2008        radioactive iodine ablation of thyroid    ???  Fibroid  06/23/2008    ???  HTN (hypertension)  06/23/2008     ???  History of elevated lipids  06/23/2008    ???  Breast mass, left          benign    ???  OSA (obstructive sleep apnea)  9/10          Current Outpatient Prescriptions    Medication  Sig  Dispense  Refill    ???  aspirin (ASPIRIN) 325 mg tablet  Take 325 mg by mouth every four (4) hours as needed.            ???  simvastatin (ZOCOR) 20 mg tablet  TAKE ONE TABLET BY MOUTH NIGHTLY.   30 Tab   0    ???  carvedilol (COREG) 6.25 mg tablet  Take 6.25 mg by mouth two (2) times a day.                Social History  reports that she has never smoked. She has never used smokeless tobacco.  reports that she does not drink alcohol.      Family History  family history includes Cancer in an other family member; Diabetes  in her daughter, mother, and another family member; Heart Attack in her father; Heart Disease in her maternal grandmother and paternal grandmother; Kidney Disease in an other family member; and Other in her daughter.      Review of Systems  Except as stated above include:  Constitutional: Negative for fever, chills and malaise/fatigue.    HEENT: Negative.    Gastrointestinal: Negative.  Musculoskeletal: Negative.          PHYSICAL EXAM  BP Readings from Last 3 Encounters:    10/17/10  118/70    08/16/10  133/79    08/09/10  125/78      Pulse Readings from Last 3 Encounters:    10/17/10  93    08/16/10  90    08/09/10  74      Wt Readings from Last 3 Encounters:    10/17/10  96.616 kg (213 lb)    08/09/10  95.709 kg (211 lb)    04/30/10  95.822 kg (211 lb 4 oz)      General:  Alert and oriented to person, place, and time.  No acute distress.  Head and Neck:  No jugular venous distention or carotid bruits.  Lungs:  Clear bilaterally.  Heart:  Regular rate and rhythm.  Normal S1/S2.  No significant murmurs, rubs or gallops.  Abdomen:  Soft and nontender.  Extremities:  No significant edema.  Neurological:  Grossly normal.            Previous Version               Vitals - Last Recorded         BP Pulse     118/70  93       Ht: 5\' 7"  (1.702 m)      Wt: 213 lb (96.616 kg)      BMI: 33.36 kg/m2      SpO2: 95%        Pain Information (Last Filed)         Score Location       0 - No pain              BMI Data         Body Mass Index Body Surface Area     33.35 kg/m 2 2.14 m 2               Orders       Lab and Imaging Orders              AMB POC EKG ROUTINE W/ 12 LEADS, INTER & REP      Ordered On: 10/17/2010                       Medications       Discontinued Medications            Reason for Discontinue     capsaicin 0.1 % topical cream       diclofenac EC (VOLTAREN) 75 mg EC tablet       traMADol-acetaminophen (ULTRACET) 37.5-325 mg per tablet       aspirin 81 mg chewable tablet                   Follow-up and Disposition         Check-out Note     medtronic AICD         Follow-up and Disposition History Recorded  Level of Service      OFFICE/OUTPT Zane Herald [[id number]]            Patient Instructions      None           Chart Review Routing History         Recipient Method       Carlynn Herald, MD     Phone: [phone]    In Basket     Sent By: Prudencio Pair [phone]     Sent: 10/21/2010     Filed: 10/21/2010                 Encounter-Level Documents:      There are no encounter-level documents.       Order-Level Documents:      There are no order-level documents.       Patient-Level Documents:      Amb-Patient Admin - Scan on 10/21/2010 4:23 PM by Danella Maiers Caprio : EMR Treatment ConsentAmb-Patient Admin - Scan on 10/21/2010 4:23 PM by Valora Corporal : EMR Treatment Consent     Amb-Patient Admin - Scan on 10/21/2010 4:22 PM by Danella Maiers Caprio : Demographic FormAmb-Patient Admin - Scan on 10/21/2010 4:22 PM by Danella Maiers Caprio : Demographic Form     Amb-Patient Admin - Scan on 10/04/2010 11:26 AM by Orlin Hilding : Miscellaneous Admin DocAmb-Patient Admin - Scan on 10/04/2010 11:26 AM by Orlin Hilding : Miscellaneous Admin Doc      Amb-Patient Admin - Scan on 02/08/2010 11:45 AM by [name] : Demographic FormAmb-Patient Admin - Scan on 02/08/2010 11:45 AM by [name] : Demographic Form     Treatment Consent - Scan on 07/19/2008 2:57 PM by Rowe Clack : Treatment ConsentTreatment Consent - Scan on 07/19/2008 2:57 PM by Rowe Clack : Treatment Consent         All Charges for This Encounter         Code Description Service Date Service Provider Modifiers Quantity     [phone] PR OFFICE/OUTPT VISIT,NEW,LEVL III 10/17/2010 Arletha Pili, MD   1     [id number] AMB POC EKG ROUTINE W/ 12 LEADS, INTER & REP 10/17/2010 Arletha Pili, MD   1         Encounter Information         Date Provider Department     10/17/2010 Arletha Pili, MD Cardio Spec [facility]     Encounter #: [id number]                  Encounter Status      Closed by Arletha Pili, MD on 10/17/10 at 4:29 PM                All Results       AMB POC EKG ROUTINE W/ 12 LEADS, INTER & REP [[id number]]  Resulted: 10/17/10 1558, Result Status: Preliminary result     Impression: Sinus rhythm.  IVCD.  LVH.  Nonspecific T wave abnormality.       AMB POC EKG ROUTINE W/ 12 LEADS, INTER & REP [[id number]]  Resulted: 10/17/10 1558, Result Status: Preliminary result                    Result Summary for AMB POC EKG ROUTINE W/ 12 LEADS, INTER & REP       Result Information         Status Provider Status  Preliminary result (10/17/2010  3:58 PM) Open         Entry Date      10/17/2010       Result Impression      Sinus rhythm.  IVCD.  LVH.  Nonspecific T wave abnormality.       Results      ECG on 10/17/2010 3:58 PM by Lynnell Dike : ECG Report       Result History      AMB POC EKG ROUTINE W/ 12 LEADS, INTER &amp; REP on 10/17/10.         Other Encounter Related Information             Medical History Info           Problem List and Allergies           All Flowsheet Data

## 2010-11-08 NOTE — Progress Notes (Signed)
Pt given written instructions at time of appt

## 2010-11-08 NOTE — H&P (Signed)
Office Visit    Cathy [name] (MRN [id number])           Reason for Visit      Cardiomyopathy     New patient referred by Dr. Patel-- possible AICD; no cardiac complaints        Diagnoses      Cardiomyopathy   - Primary     425.4     CHF (congestive heart failure)      428.0            Progress Notes      RYAN N SEUTTER, MD  10/21/2010  9:39 AM  Signed  HPI:  This is a 60-year-old, Caucasian female accompanied by her husband.  She is referred by Dr. Apurva Patel for evaluation for possible AICD.  She has had a nonischemic cardiomyopathy with progressive decline in her left ventricular function for at least four years.  Most recent echocardiogram showed ejection fraction of 20%.  She has mild chronic exertional dyspnea.  No syncope.  No chest pain.  No PND, orthopnea, edema.  She has had some issues taking ACE inhibitor due to hypotension.  She has also had difficulty with beta blocker due to low heart rate.      Impression:  Nonischemic cardiomyopathy.  Ejection fraction of 20% and progressive decline since 2008.  Heart catheterization 2008 showed no disease.  Nuclear stress test 2010 showed no ischemia.  Class II congestive heart failure.  Dyslipidemia.  Intolerance to ACE inhibitor and beta blockers historically due to hypotension and bradycardia.  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      I discussed risks, benefits, and alternatives to AICD implantation for primary prevention.  She would like to proceed.  Risks include but are not limited to that of pain, infection, stroke, pneumothorax, hemopericardium.  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  I did quote small chance of inappropriate shocks.  All questions were answered.          Past Medical History    Diagnosis  Date    ???  CHF (congestive heart failure)  06/23/2008    ???  Hypothyroid hx goiter  06/23/2008        radioactive iodine ablation of thyroid    ???  Fibroid  06/23/2008    ???  HTN (hypertension)  06/23/2008     ???  History of elevated lipids  06/23/2008    ???  Breast mass, left          benign    ???  OSA (obstructive sleep apnea)  9/10          Current Outpatient Prescriptions    Medication  Sig  Dispense  Refill    ???  aspirin (ASPIRIN) 325 mg tablet  Take 325 mg by mouth every four (4) hours as needed.            ???  simvastatin (ZOCOR) 20 mg tablet  TAKE ONE TABLET BY MOUTH NIGHTLY.   30 Tab   0    ???  carvedilol (COREG) 6.25 mg tablet  Take 6.25 mg by mouth two (2) times a day.                Social History  reports that she has never smoked. She has never used smokeless tobacco.  reports that she does not drink alcohol.      Family History  family history includes Cancer in an other family member; Diabetes   in her daughter, mother, and another family member; Heart Attack in her father; Heart Disease in her maternal grandmother and paternal grandmother; Kidney Disease in an other family member; and Other in her daughter.      Review of Systems  Except as stated above include:  Constitutional: Negative for fever, chills and malaise/fatigue.    HEENT: Negative.    Gastrointestinal: Negative.  Musculoskeletal: Negative.          PHYSICAL EXAM  BP Readings from Last 3 Encounters:    10/17/10  118/70    08/16/10  133/79    08/09/10  125/78      Pulse Readings from Last 3 Encounters:    10/17/10  93    08/16/10  90    08/09/10  74      Wt Readings from Last 3 Encounters:    10/17/10  96.616 kg (213 lb)    08/09/10  95.709 kg (211 lb)    04/30/10  95.822 kg (211 lb 4 oz)      General:  Alert and oriented to person, place, and time.  No acute distress.  Head and Neck:  No jugular venous distention or carotid bruits.  Lungs:  Clear bilaterally.  Heart:  Regular rate and rhythm.  Normal S1/S2.  No significant murmurs, rubs or gallops.  Abdomen:  Soft and nontender.  Extremities:  No significant edema.  Neurological:  Grossly normal.            Previous Version               Vitals - Last Recorded         BP Pulse     118/70  93       Ht: 5' 7" (1.702 m)      Wt: 213 lb (96.616 kg)      BMI: 33.36 kg/m2      SpO2: 95%        Pain Information (Last Filed)         Score Location       0 - No pain              BMI Data         Body Mass Index Body Surface Area     33.35 kg/m 2 2.14 m 2               Orders       Lab and Imaging Orders              AMB POC EKG ROUTINE W/ 12 LEADS, INTER & REP      Ordered On: 10/17/2010                       Medications       Discontinued Medications            Reason for Discontinue     capsaicin 0.1 % topical cream       diclofenac EC (VOLTAREN) 75 mg EC tablet       traMADol-acetaminophen (ULTRACET) 37.5-325 mg per tablet       aspirin 81 mg chewable tablet                   Follow-up and Disposition         Check-out Note     medtronic AICD         Follow-up and Disposition History Recorded          Level of Service      OFFICE/OUTPT VISIT,NEW,LEVL III [99203]            Patient Instructions      None           Chart Review Routing History         Recipient Method       Baher A Basta, MD     Phone: [phone]    In Basket     Sent By: Kimberly D Allen [[id number]]     Sent: 10/21/2010     Filed: 10/21/2010                 Encounter-Level Documents:      There are no encounter-level documents.       Order-Level Documents:      There are no order-level documents.       Patient-Level Documents:      Amb-Patient Admin - Scan on 10/21/2010 4:23 PM by Ashley M Caprio : EMR Treatment ConsentAmb-Patient Admin - Scan on 10/21/2010 4:23 PM by Ashley M Caprio : EMR Treatment Consent     Amb-Patient Admin - Scan on 10/21/2010 4:22 PM by Ashley M Caprio : Demographic FormAmb-Patient Admin - Scan on 10/21/2010 4:22 PM by Ashley M Caprio : Demographic Form     Amb-Patient Admin - Scan on 10/04/2010 11:26 AM by Ashley Aulisio : Miscellaneous Admin DocAmb-Patient Admin - Scan on 10/04/2010 11:26 AM by Ashley Aulisio : Miscellaneous Admin Doc      Amb-Patient Admin - Scan on 02/08/2010 11:45 AM by Mary Ann Hall : Demographic FormAmb-Patient Admin - Scan on 02/08/2010 11:45 AM by Mary Ann Hall : Demographic Form     Treatment Consent - Scan on 07/19/2008 2:57 PM by Kayla N Creech : Treatment ConsentTreatment Consent - Scan on 07/19/2008 2:57 PM by Kayla N Creech : Treatment Consent         All Charges for This Encounter         Code Description Service Date Service Provider Modifiers Quantity     99203 PR OFFICE/OUTPT VISIT,NEW,LEVL III 10/17/2010 Ryan N Seutter, MD   1     [id number] AMB POC EKG ROUTINE W/ 12 LEADS, INTER & REP 10/17/2010 Ryan N Seutter, MD   1         Encounter Information         Date Provider Department     10/17/2010 Ryan N Seutter, MD Cardio Spec [facility]     Encounter #: [id number]                  Encounter Status      Closed by Ryan N Seutter, MD on 10/17/10 at 4:29 PM                All Results       AMB POC EKG ROUTINE W/ 12 LEADS, INTER & REP [[id number]]  Resulted: 10/17/10 1558, Result Status: Preliminary result     Impression: Sinus rhythm.  IVCD.  LVH.  Nonspecific T wave abnormality.       AMB POC EKG ROUTINE W/ 12 LEADS, INTER & REP [[id number]]  Resulted: 10/17/10 1558, Result Status: Preliminary result                    Result Summary for AMB POC EKG ROUTINE W/ 12 LEADS, INTER & REP       Result Information         Status Provider Status          Preliminary result (10/17/2010  3:58 PM) Open         Entry Date      10/17/2010       Result Impression      Sinus rhythm.  IVCD.  LVH.  Nonspecific T wave abnormality.       Results      ECG on 10/17/2010 3:58 PM by Jennifer L Reeves : ECG Report       Result History      AMB POC EKG ROUTINE W/ 12 LEADS, INTER &amp; REP on 10/17/10.         Other Encounter Related Information             Medical History Info           Problem List and Allergies           All Flowsheet Data

## 2010-11-09 LAB — CBC WITH AUTOMATED DIFF
ABS. BASOPHILS: 0 10*3/uL (ref 0.0–0.1)
ABS. EOSINOPHILS: 0 10*3/uL (ref 0.0–0.4)
ABS. LYMPHOCYTES: 1.4 10*3/uL (ref 0.9–3.6)
ABS. MONOCYTES: 0.5 10*3/uL (ref 0.05–1.2)
ABS. NEUTROPHILS: 2.8 10*3/uL (ref 1.8–8.0)
BASOPHILS: 0 % (ref 0–2)
EOSINOPHILS: 1 % (ref 0–5)
HCT: 37.9 % (ref 35.0–45.0)
HGB: 12.4 g/dL (ref 12.0–16.0)
LYMPHOCYTES: 30 % (ref 21–52)
MCH: 30.4 PG (ref 24.0–34.0)
MCHC: 32.7 g/dL (ref 31.0–37.0)
MCV: 92.9 FL (ref 74.0–97.0)
MONOCYTES: 10 % (ref 3–10)
MPV: 11.6 FL (ref 9.2–11.8)
NEUTROPHILS: 59 % (ref 40–73)
PLATELET: 256 10*3/uL (ref 135–420)
RBC: 4.08 M/uL — ABNORMAL LOW (ref 4.20–5.30)
RDW: 13.1 % (ref 11.6–14.5)
WBC: 4.7 10*3/uL (ref 4.6–13.2)

## 2010-11-09 LAB — METABOLIC PANEL, COMPREHENSIVE
A-G Ratio: 1.3 (ref 0.8–1.7)
ALT (SGPT): 42 U/L (ref 30–65)
AST (SGOT): 13 U/L — ABNORMAL LOW (ref 15–37)
Albumin: 3.9 g/dL (ref 3.4–5.0)
Alk. phosphatase: 127 U/L (ref 50–136)
Anion gap: 4 mmol/L — ABNORMAL LOW (ref 5–15)
BUN/Creatinine ratio: 19 (ref 12–20)
BUN: 15 MG/DL (ref 7–18)
Bilirubin, total: 0.4 MG/DL (ref 0.2–1.0)
CO2: 33 MMOL/L — ABNORMAL HIGH (ref 21–32)
Calcium: 9 MG/DL (ref 8.4–10.4)
Chloride: 104 MMOL/L (ref 100–108)
Creatinine: 0.8 MG/DL (ref 0.6–1.3)
GFR est AA: >60 mL/min/{1.73_m2} (ref >60)
GFR est non-AA: >60 mL/min/{1.73_m2} (ref >60)
Globulin: 3 g/dL (ref 2.0–4.0)
Glucose: 81 MG/DL (ref 74–99)
Potassium: 3.8 MMOL/L (ref 3.5–5.5)
Protein, total: 6.9 g/dL (ref 6.4–8.2)
Sodium: 141 MMOL/L (ref 136–145)

## 2010-11-09 LAB — PROTHROMBIN TIME + INR
INR: 1 (ref 0.0–1.2)
Prothrombin time: 13.3 s (ref 11.5–15.2)

## 2010-11-11 NOTE — Progress Notes (Signed)
Addended by: Aleia Larocca N on: 11/11/2010 08:01 AM     Modules accepted: Orders

## 2010-11-11 NOTE — Progress Notes (Signed)
Quick Note:    Pre-procedure..Cathy Crenshaw, MA    ______

## 2010-11-11 NOTE — Progress Notes (Signed)
Quick Note:    Pre-procedure..Ethelean Colla, MA    ______

## 2010-11-15 DIAGNOSIS — Z95810 Presence of automatic (implantable) cardiac defibrillator: Secondary | ICD-10-CM | POA: Insufficient documentation

## 2010-11-15 MED ORDER — VANCOMYCIN 1,000 MG IV SOLR
1000 mg | Freq: Once | INTRAVENOUS | Status: AC
Start: 2010-11-15 — End: 2010-11-15
  Administered 2010-11-15: 17:00:00 via INTRAVENOUS

## 2010-11-15 MED ORDER — MIDAZOLAM 1 MG/ML IJ SOLN
1 mg/mL | INTRAMUSCULAR | Status: DC | PRN
Start: 2010-11-15 — End: 2010-11-15
  Administered 2010-11-15 (×4): via INTRAVENOUS

## 2010-11-15 MED ORDER — VANCOMYCIN 1,000 MG IV SOLR
1000 mg | Freq: Two times a day (BID) | INTRAVENOUS | Status: AC
Start: 2010-11-15 — End: 2010-11-16
  Administered 2010-11-16 (×2): via INTRAVENOUS

## 2010-11-15 MED ORDER — ASPIRIN 325 MG TAB
325 mg | Freq: Every day | ORAL | Status: DC
Start: 2010-11-15 — End: 2010-11-16
  Administered 2010-11-16: 13:00:00 via ORAL

## 2010-11-15 MED ORDER — MORPHINE 2 MG/ML INJECTION
2 mg/mL | INTRAMUSCULAR | Status: DC | PRN
Start: 2010-11-15 — End: 2010-11-16
  Administered 2010-11-15: 19:00:00 via INTRAVENOUS

## 2010-11-15 MED ORDER — VANCOMYCIN 1,000 MG IV SOLR
1000 mg | Freq: Once | INTRAVENOUS | Status: AC
Start: 2010-11-15 — End: 2010-11-15
  Administered 2010-11-15: 18:00:00 via INTRAVENOUS

## 2010-11-15 MED ORDER — HEPARIN (PORCINE) IN NS (PF) 1,000 UNIT/500 ML IV
1000 unit/500 mL | Freq: Once | INTRAVENOUS | Status: AC
Start: 2010-11-15 — End: 2010-11-15
  Administered 2010-11-15: 18:00:00

## 2010-11-15 MED ORDER — VANCOMYCIN 1,000 MG IV SOLR
1000 mg | Freq: Once | INTRAVENOUS | Status: DC
Start: 2010-11-15 — End: 2010-11-15

## 2010-11-15 MED ORDER — CARVEDILOL 6.25 MG TAB
6.25 mg | Freq: Two times a day (BID) | ORAL | Status: DC
Start: 2010-11-15 — End: 2010-11-16
  Administered 2010-11-15 – 2010-11-16 (×2): via ORAL

## 2010-11-15 MED ORDER — SODIUM CHLORIDE 0.9 % IV PIGGY BACK
1000 mg | Freq: Once | INTRAVENOUS | Status: DC
Start: 2010-11-15 — End: 2010-11-15
  Administered 2010-11-15: 16:00:00 via INTRAVENOUS

## 2010-11-15 MED ORDER — FENTANYL CITRATE (PF) 50 MCG/ML IJ SOLN
50 mcg/mL | INTRAMUSCULAR | Status: DC | PRN
Start: 2010-11-15 — End: 2010-11-15
  Administered 2010-11-15 (×5): via INTRAVENOUS

## 2010-11-15 MED ADMIN — iodixanol (VISIPAQUE) 320 mg/mL contrast injection 1-50 mL: INTRAVENOUS | @ 18:00:00 | NDC 00407222301

## 2010-11-15 MED ADMIN — 0.9% sodium chloride infusion: INTRAVENOUS | @ 16:00:00 | NDC 00409798309

## 2010-11-15 MED ADMIN — lidocaine (PF) (XYLOCAINE) 10 mg/mL (1 %) injection Soln 1-60 mL: SUBCUTANEOUS | @ 18:00:00 | NDC 63323049231

## 2010-11-15 MED ADMIN — 0.9% sodium chloride infusion: INTRAVENOUS | @ 16:00:00 | NDC 87701099893

## 2010-11-15 MED ADMIN — lidocaine (PF) (XYLOCAINE) 10 mg/mL (1 %) injection Soln 1-60 mL: SUBCUTANEOUS | @ 17:00:00 | NDC 00409427902

## 2010-11-15 NOTE — Other (Signed)
TRANSFER - IN REPORT:    Verbal report received from Dwale, RN (name) on MELYNDA KRZYWICKI  being received from [facility](unit) for routine progression of care      Report consisted of patient???s Situation, Background, Assessment and   Recommendations(SBAR).     Information from the following report(s) SBAR was reviewed with the receiving nurse.    Opportunity for questions and clarification was provided.      Assessment completed upon patient???s arrival to unit and care assumed.

## 2010-11-15 NOTE — H&P (Signed)
Plan AICD implantation for primary prevention.  All risks, benefits, alternatives discussed.  Risks included but not limited to pain, infection, bleeding, anesthesia reactions, pneumothorax, hemothorax, emergent open heart surgery, and death.  All questions answered.      HPI: This is a 60 year old, Caucasian female accompanied by her husband. She is referred by Dr. Gracy Bruins for evaluation for possible AICD. She has had a nonischemic cardiomyopathy with progressive decline in her left ventricular function for at least four years. Most recent echocardiogram showed ejection fraction of 20%. She has mild chronic exertional dyspnea. No syncope. No chest pain. No PND, orthopnea, edema. She has had some issues taking ACE inhibitor due to hypotension. She has also had difficulty with beta blocker due to low heart rate.   Impression:   1. Nonischemic cardiomyopathy. Ejection fraction of 20% and progressive decline since 2008. Heart catheterization 2008 showed no disease. Nuclear stress test 2010 showed no ischemia.  2. Class II congestive heart failure.  3. Dyslipidemia.  4. Intolerance to ACE inhibitor and beta blockers historically due to hypotension and bradycardia.  5. 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  I discussed risks, benefits, and alternatives to AICD implantation for primary prevention. She would like to proceed. Risks include but are not limited to that of pain, infection, stroke, pneumothorax, hemopericardium. 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 I did quote small chance of inappropriate shocks. All questions were answered.   Past Medical History    Diagnosis  Date    ???  CHF (congestive heart failure)  06/23/2008    ???  Hypothyroid hx goiter  06/23/2008      radioactive iodine ablation of thyroid    ???  Fibroid  06/23/2008    ???  HTN (hypertension)  06/23/2008    ???  History of elevated lipids  06/23/2008    ???  Breast mass, left       benign     ???  OSA (obstructive sleep apnea)  9/10      Current Outpatient Prescriptions    Medication  Sig  Dispense  Refill    ???  aspirin (ASPIRIN) 325 mg tablet  Take 325 mg by mouth every four (4) hours as needed.      ???  simvastatin (ZOCOR) 20 mg tablet  TAKE ONE TABLET BY MOUTH NIGHTLY.  30 Tab  0    ???  carvedilol (COREG) 6.25 mg tablet  Take 6.25 mg by mouth two (2) times a day.      Social History   reports that she has never smoked. She has never used smokeless tobacco.   reports that she does not drink alcohol.   Family History   family history includes Cancer in an other family member; Diabetes in her daughter, mother, and another family member; Heart Attack in her father; Heart Disease in her maternal grandmother and paternal grandmother; Kidney Disease in an other family member; and Other in her daughter.   Review of Systems   Except as stated above include:   Constitutional: Negative for fever, chills and malaise/fatigue.   HEENT: Negative.   Gastrointestinal: Negative.   Musculoskeletal: Negative.   PHYSICAL EXAM   BP Readings from Last 3 Encounters:    10/17/10  118/70    08/16/10  133/79    08/09/10  125/78      Pulse Readings from Last 3 Encounters:    10/17/10  93    08/16/10  90  08/09/10  74      Wt Readings from Last 3 Encounters:    10/17/10  96.616 kg (213 lb)    08/09/10  95.709 kg (211 lb)    04/30/10  95.822 kg (211 lb 4 oz)    General: Alert and oriented to person, place, and time. No acute distress.   Head and Neck: No jugular venous distention or carotid bruits.   Lungs: Clear bilaterally.   Heart: Regular rate and rhythm. Normal S1/S2. No significant murmurs, rubs or gallops.   Abdomen: Soft and nontender.   Extremities: No significant edema.   Neurological: Grossly normal.

## 2010-11-15 NOTE — Other (Signed)
TRANSFER - OUT REPORT:    Verbal report given to JENNIFER RN(name) on Cathy [name]  being transferred to [facility](unit) for routine progression of care       Report consisted of patient???s Situation, Background, Assessment and   Recommendations(SBAR).     Information from the following report(s) SBAR, Kardex, Procedure Summary and MAR was reviewed with the receiving nurse.    Opportunity for questions and clarification was provided.

## 2010-11-15 NOTE — Other (Signed)
TRANSFER - OUT REPORT:    Verbal report given to MANDI(name) on Cathy [name]  being transferred to [facility](unit) for routine progression of care       Report consisted of patient???s Situation, Background, Assessment and   Recommendations(SBAR).     Information from the following report(s) SBAR was reviewed with the receiving nurse.    Opportunity for questions and clarification was provided.

## 2010-11-15 NOTE — Progress Notes (Signed)
TRANSFER - OUT REPORT:    Verbal report given to Eileen,RN(name) on Cathy [name]  being transferred to [facility](unit) for routine post - op       Report consisted of patient???s Situation, Background, Assessment and   Recommendations(SBAR).     Information from the following report(s) SBAR was reviewed with the receiving nurse.    Opportunity for questions and clarification was provided.

## 2010-11-15 NOTE — Procedures (Signed)
Procedure:  1.  Implantation dual chamber Medtronic AICD for primary prevention.  2.  Conscious sedation with versed and fentanyl.  3.  Left axillary venogram.    Diagnosis:  -Non-ischemic cardiomyopathy with EF 20% > 6 months despite medical treatment  -Class II CHF  -Bradycardia and intolerance to higher doses of beta-blockers  -Intolerance to ACEI due to hypotension  -Life expectancy greater than 1 year.    Procedure:  After informed consent was obtained, the patient was brought to the electrophysiology lab in a fasting and nonsedated state.  The left chest was prepped and drapped in the usual sterile fashion.  A left axillary venogram revealed vein patency.  Local lidocaine was infiltratred below the left clavicle.  A 4 cm transverse incision was created on the left chest.  Using electrocautery and blunt dissection, a pocket was made and hemostasis confirmed.  Into the left axillary vein was inserted a 9 and 7 French sheath.  A right ventricular lead and right atrial leads was positioned under flouroscopic guidance.  The sheaths were removed and leads were sutured in place.  The pocket was washed with antibiotic solution.  The generator was attached to the leads, placed in the pocket, and sutured in place.  The pocket was closed with 2-0 vicryl in two deep layers and the skin was closed with 4-0 monocryl.  Steristrips and dressing were applied.  The patient left the lab in stable condition.  No DFT testing was performed due to difficulty in adequately sedating the patient.  Despite high doses of versed and fentanyl, he remained quite awake.      Impression:  Successful implantation of dual chamber Medtronic AICD for primary prevention.  Plan to discharge in the morning if stable.  No ACEI due to intolerance from hypotension.  An atrial lead was placed due to bradycardia and need for up-titrating beta-blocker.

## 2010-11-15 NOTE — Procedures (Signed)
Procedure:  1.  Implantation dual chamber Medtronic AICD for primary prevention.  2.  Conscious sedation with versed and fentanyl.  3.  Left axillary venogram.    Diagnosis:  -Non-ischemic cardiomyopathy with EF 20% > 6 months despite medical treatment  -Class II CHF  -Bradycardia and intolerance to higher doses of beta-blockers  -Intolerance to ACEI due to hypotension  -Life expectancy greater than 1 year.    Procedure:  After informed consent was obtained, the patient was brought to the electrophysiology lab in a fasting and nonsedated state.  The left chest was prepped and drapped in the usual sterile fashion.  A left axillary venogram revealed vein patency.  Local lidocaine was infiltratred below the left clavicle.  A 4 cm transverse incision was created on the left chest.  Using electrocautery and blunt dissection, a pocket was made and hemostasis confirmed.  Into the left axillary vein was inserted a 9 and 7 [place] sheath.  A right ventricular lead and right atrial leads was positioned under flouroscopic guidance.  The sheaths were removed and leads were sutured in place.  The pocket was washed with antibiotic solution.  The generator was attached to the leads, placed in the pocket, and sutured in place.  The pocket was closed with 2-0 vicryl in two deep layers and the skin was closed with 4-0 monocryl.  Steristrips and dressing were applied.  The patient left the lab in stable condition.  No DFT testing was performed due to difficulty in adequately sedating the patient.  Despite high doses of versed and fentanyl, he remained quite awake.      Impression:  Successful implantation of dual chamber Medtronic AICD for primary prevention.  Plan to discharge in the morning if stable.  No ACEI due to intolerance from hypotension.  An atrial lead was placed due to bradycardia and need for up-titrating beta-blocker.

## 2010-11-16 MED ORDER — HYDROCODONE-ACETAMINOPHEN 5 MG-325 MG TAB
5-325 mg | ORAL_TABLET | ORAL | Status: DC | PRN
Start: 2010-11-16 — End: 2010-12-15

## 2010-11-16 MED ADMIN — simvastatin (ZOCOR) tablet 20 mg: ORAL | @ 01:00:00 | NDC 68084051211

## 2010-11-16 MED ADMIN — HYDROcodone-acetaminophen (NORCO) 5-325 mg per tablet 1 Tab: ORAL | @ 13:00:00 | NDC 68084036811

## 2010-11-16 MED ADMIN — HYDROcodone-acetaminophen (NORCO) 5-325 mg per tablet 2 Tab: ORAL | @ 01:00:00 | NDC 68084036811

## 2010-11-16 NOTE — Discharge Summary (Signed)
Discharge Summary     Patient: Cathy [name] MRN: [id number]  SSN: [id number]    Date of Birth: 1950/11/01  Age: 60 y.o.  Sex: female       Admit Date: 11/15/2010    Discharge Date: 12/13/10      Admission Diagnoses: Other primary cardiomyopathies [425.4] (425.4)  See procedure note for details  See procedure note for details    Discharge Diagnoses:   Problem List as of 2010-12-13  Date Reviewed: 12-13-10      Codes Class Noted - Resolved    *AICD (automatic cardioverter/defibrillator) present V45.02  11/15/2010 - Present    Overview       Signed 11/15/2010  2:40 PM by Arletha Pili, MD     Implanted dual chamber medtronic AICD 11/15/10          Cardiomyopathy 425.4  10/17/2010 - Present        Cataract 366.9  12/01/2008 - Present        Gallstones 574.20  11/13/2008 - Present        Insomnia 780.52  11/13/2008 - Present        OSA (obstructive sleep apnea) 327.23  11/13/2008 - Present        CHF (congestive heart failure) 428.0  06/23/2008 - Present        Hypothyroid hx goiter 244.9  06/23/2008 - Present        Fibroid 218.9  06/23/2008 - Present        HTN (hypertension) 401.9  06/23/2008 - Present        History of elevated lipids V12.29  06/23/2008 - Present               Discharge Condition: Good    Hospital Course: Patient was admitted for dual chamber medtronic AICD for NICMY 20% which was successfully done on 9/21. Patients post procedure course was uneventful.    Consults: None        Disposition: home    Discharge Medications:   Current Discharge Medication List      CONTINUE these medications which have CHANGED    Details   HYDROcodone-acetaminophen (NORCO) 5-325 mg per tablet Take 1 Tab by mouth every four (4) hours as needed (prn pain).  Qty: 20 Tab, Refills: 0         CONTINUE these medications which have NOT CHANGED    Details   simvastatin (ZOCOR) 20 mg tablet TAKE ONE TABLET BY MOUTH NIGHTLY.  Qty: 30 Tab, Refills: 0      carvedilol (COREG) 6.25 mg tablet Take 6.25 mg by mouth two (2) times a day.       aspirin (ASPIRIN) 325 mg tablet Take 325 mg by mouth every four (4) hours as needed.               Activity: as directed  Diet: Cardiac Diet  Wound Care: As directed    Follow-up Appointments   Procedures   ??? FOLLOW UP VISIT Appointment in: One Week 7-10 days for wound check/device check     Standing Status: Standing      Number of Occurrences: 1      Standing Expiration Date:      7-10 days for wound check/device check  4 weeks with Dr. Doristine Johns     Order Specific Question:  Appointment in     Answer:  One Week       Signed ByEliot Ford, PA     2010-12-13

## 2010-11-16 NOTE — Other (Signed)
Bedside and Verbal shift change report given to Jennifer Friedline,RN (oncoming nurse) by Denise Gilmore,RN (offgoing nurse).  Report given with SBAR and Kardex.

## 2010-11-16 NOTE — Discharge Summary (Signed)
Seen and independently examined.  Wound site ok,  Interrogation shows normal function.  Stable for discharge.

## 2010-11-16 NOTE — Progress Notes (Signed)
Discharge instructions and medications discussed with patient and husband at bedside. All questions answered. Handouts given on procedure. Pt verbalized understanding. Sling in place. Dressing CDI. No distress noted. Discharged via wheelchair.

## 2010-11-28 NOTE — Progress Notes (Signed)
Please review scanned document.

## 2010-12-15 LAB — METABOLIC PANEL, COMPREHENSIVE
A-G Ratio: 1 (ref 0.8–1.7)
ALT (SGPT): 38 U/L (ref 30–65)
AST (SGOT): 17 U/L (ref 15–37)
Albumin: 3.6 g/dL (ref 3.4–5.0)
Alk. phosphatase: 123 U/L (ref 50–136)
Anion gap: 2 mmol/L — ABNORMAL LOW (ref 5–15)
BUN/Creatinine ratio: 16 (ref 12–20)
BUN: 13 MG/DL (ref 7–18)
Bilirubin, total: 0.3 MG/DL (ref 0.2–1.0)
CO2: 33 MMOL/L — ABNORMAL HIGH (ref 21–32)
Calcium: 9.2 MG/DL (ref 8.4–10.4)
Chloride: 105 MMOL/L (ref 100–108)
Creatinine: 0.8 MG/DL (ref 0.6–1.3)
GFR est AA: >60 mL/min/{1.73_m2} (ref >60)
GFR est non-AA: >60 mL/min/{1.73_m2} (ref >60)
Globulin: 3.5 g/dL (ref 2.0–4.0)
Glucose: 85 MG/DL (ref 74–99)
Potassium: 4.1 MMOL/L (ref 3.5–5.5)
Protein, total: 7.1 g/dL (ref 6.4–8.2)
Sodium: 140 MMOL/L (ref 136–145)

## 2010-12-15 LAB — POC TROPONIN-I: Troponin-I (POC): 0.04 ng/mL (ref 0.00–0.08)

## 2010-12-15 LAB — CBC WITH AUTOMATED DIFF
ABS. BASOPHILS: 0 10*3/uL (ref 0.0–0.1)
ABS. EOSINOPHILS: 0 10*3/uL (ref 0.0–0.4)
ABS. LYMPHOCYTES: 1.2 10*3/uL (ref 0.9–3.6)
ABS. MONOCYTES: 0.3 10*3/uL (ref 0.05–1.2)
ABS. NEUTROPHILS: 2.5 10*3/uL (ref 1.8–8.0)
BASOPHILS: 1 % (ref 0–2)
EOSINOPHILS: 1 % (ref 0–5)
HCT: 37.2 % (ref 35.0–45.0)
HGB: 12.1 g/dL (ref 12.0–16.0)
LYMPHOCYTES: 30 % (ref 21–52)
MCH: 30 PG (ref 24.0–34.0)
MCHC: 32.5 g/dL (ref 31.0–37.0)
MCV: 92.3 FL (ref 74.0–97.0)
MONOCYTES: 7 % (ref 3–10)
MPV: 11.6 FL (ref 9.2–11.8)
NEUTROPHILS: 61 % (ref 40–73)
PLATELET: 222 10*3/uL (ref 135–420)
RBC: 4.03 M/uL — ABNORMAL LOW (ref 4.20–5.30)
RDW: 13 % (ref 11.6–14.5)
WBC: 4 10*3/uL — ABNORMAL LOW (ref 4.6–13.2)

## 2010-12-15 LAB — EKG, 12 LEAD, INITIAL
Atrial Rate: 83 {beats}/min
Calculated P Axis: 41 degrees
Calculated R Axis: -14 degrees
Calculated T Axis: 148 degrees
Diagnosis: NORMAL
P-R Interval: 158 ms
Q-T Interval: 390 ms
QRS Duration: 98 ms
QTC Calculation (Bezet): 458 ms
Ventricular Rate: 83 {beats}/min

## 2010-12-15 LAB — PROTHROMBIN TIME + INR
INR: 1 (ref 0.0–1.2)
Prothrombin time: 13.5 s (ref 11.5–15.2)

## 2010-12-15 LAB — CARDIAC PANEL,(CK, CKMB & TROPONIN)
CK - MB: <0.5 ng/ml — ABNORMAL LOW (ref 0.5–3.6)
CK: 31 U/L (ref 26–192)
Troponin-I, QT: <0.04 NG/ML (ref 0.00–0.08)

## 2010-12-15 LAB — MAGNESIUM: Magnesium: 2 MG/DL (ref 1.8–2.4)

## 2010-12-15 LAB — PTT: aPTT: 33.7 s (ref 24.6–37.7)

## 2010-12-15 NOTE — ED Notes (Signed)
Pt, states  She  Felt her heart beating fast ,with shortness of breath started 2 hours ago.

## 2010-12-15 NOTE — ED Provider Notes (Signed)
Patient is a 60 y.o. female presenting with palpitations and shortness of breath.   Palpitations   Associated symptoms include shortness of breath.   Shortness of Breath     Pt with a history of CHF and chronic exertional dyspnea.  S/p AICD placement in September.  Was in the bathroom brushing her teeth when she developed shortness of breath and nausea, lightheadedness.  Decided to come to ED.  Developed L sided chest pain when she arrived in ED but dyspnea, nausea and lightheadedness were subsiding at the time.  Pain is now resolved.  She denies any aggravating or alleviating factors.  No vomiting, no fevers or chills, no abdominal pain.  She has not seen cardiology since her AICD placement and she does not know how to phone in to interrogate device.  She denies any syncope, no chest wall trauma but notes that pain is located over AICD pocket site.    Past Medical History   Diagnosis Date   ??? CHF (congestive heart failure) 06/23/2008   ??? 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 Fibroid 06/23/2008   ??? HTN (hypertension) 06/23/2008   ??? History of elevated lipids 06/23/2008   ??? Breast mass, left      benign   ??? OSA (obstructive sleep apnea) 9/10   ??? CAD (coronary artery disease)         Past Surgical History   Procedure Date   ??? Hx tonsil and adenoidectomy    ??? Hx tubal ligation    ??? Hx total abdominal hysterectomy 1996   ??? Hx cholecystectomy    ??? Hx pacemaker          Family History   Problem Relation Age of Onset   ??? Diabetes Mother    ??? Heart Attack Father      MI   ??? Heart Disease Maternal Grandmother    ??? Heart Disease Paternal Grandmother    ??? Kidney Disease Other      1 sib deceased   ??? Cancer Other      1 sib throat cancer   ??? Diabetes Other    ??? Diabetes Daughter    ??? Other Daughter      leukemia        History     Social History   ??? Marital Status: Married     Spouse Name: N/A     Number of Children: N/A   ??? 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 Smoking status: Never Smoker    ??? Smokeless tobacco: Never Used   ??? Alcohol Use: No   ??? Drug Use: No   ??? Sexually Active: Not on file     Other Topics Concern   ??? Not on file     Social History Narrative   ??? No narrative on file                  ALLERGIES: Pcn      Review of Systems   Respiratory: Positive for shortness of breath.    Cardiovascular: Positive for palpitations.       Filed Vitals:    12/15/10 1152   BP: 112/72   Pulse: 82   Temp: 97.2 ??F (36.2 ??C)   Resp: 16   Height: 5\' 10"  (1.778 m)   Weight: 90.719  kg (200 lb)   SpO2: 97%            Physical Exam   Vitals reviewed.  Constitutional: She is oriented to person, place, and time. She appears well-developed and well-nourished. No distress.        Resting comfortably on stretcher   HENT:   Head: Normocephalic and atraumatic.        MM moist   Eyes: Conjunctivae and EOM are normal. No scleral icterus.        Sclera clear bilaterally   Neck: Neck supple. No JVD present.        Non-tender to palpation   Cardiovascular: Normal rate, regular rhythm and normal heart sounds.  Exam reveals no gallop and no friction rub.    No murmur heard.  Pulmonary/Chest: Effort normal and breath sounds normal. No respiratory distress. She has no wheezes. She has no rales. She exhibits tenderness.        No crepitance with palpation.  AICD pocket site mildly tender to palpation but no erythema, induration or exudate noted.  Incision site is well healed.   Abdominal: Soft. She exhibits no distension. There is no tenderness.   Genitourinary:        No CVA tenderness   Musculoskeletal: She exhibits no tenderness.        Normal inspection of upper extremities.  No edema noted to bilateral lower extremities   Lymphadenopathy:     She has no cervical adenopathy.   Neurological: She is alert and oriented to person, place, and time.        Normal motor and sensation bilaterally    Skin: Skin is warm and dry. No rash noted. She is not diaphoretic.   Psychiatric:        Normal mood and affect.  No SI/HI        MDM     Differential Diagnosis; Clinical Impression; Plan:     Pt with atypical cardiac symptoms and newly implanted AICD.  She does not believe device fired.  History of chronic exertional dyspnea.  Will check labs, CXR, EKG and reassess.  No acute distress, VS and exam are reassuring.    Spoke with [employer] rep.  Will come to ED to interrogate device.    Discussed with Dr. Doristine Johns.  Agrees with plans for second troponin and interrogation with discharge to home if WNL.    AICD interrogated.  No events recorded.  Pt with negative trop x 2.  She feels better and is ready to go home.  Will follow up with her cardiologist.    Pt resting comfortably.  No pain at present.  She agrees with tentative plans for interrogation and discharge to home to follow up with Dr. Allena Katz if WNL.  Amount and/or Complexity of Data Reviewed:   Clinical lab tests:  Ordered and reviewed   Decide to obtain previous medical records or to obtain history from someone other than the patient:  Yes   Obtain history from someone other than the patient:  Yes   Review and summarize past medical records:  Yes   Discuss the patient with another provider:  Yes   Independant visualization of image, tracing, or specimen:  Yes  Risk of Significant Complications, Morbidity, and/or Mortality:   Presenting problems:  Moderate  Management options:  Moderate  Progress:   Patient progress:  Improved      Procedures    CXR:  Cardiomegaly, poor inspiratory effort, AICD in place, no acute cardiopulmonary process

## 2010-12-15 NOTE — ED Notes (Signed)
Patient armband removed and given to patient to take home.  Patient was informed of the privacy risks if armband lost or stolenI have reviewed discharge instructions with the patient.  The patient verbalized understanding.

## 2010-12-15 NOTE — ED Notes (Signed)
Pt. Still c/o chest pain, charge nurse made aware , still  No available bed in [facility], repeat  EKG ordered

## 2010-12-15 NOTE — ED Notes (Signed)
Patient resting on stretcher in no apparent distress. VSS on monitor

## 2010-12-15 NOTE — ED Notes (Signed)
Patient resting on stretcher in no apparent distress.

## 2010-12-16 LAB — EKG, 12 LEAD, SUBSEQUENT
Atrial Rate: 82 {beats}/min
Calculated P Axis: 44 degrees
Calculated R Axis: -13 degrees
Calculated T Axis: 154 degrees
Diagnosis: NORMAL
P-R Interval: 162 ms
Q-T Interval: 414 ms
QRS Duration: 102 ms
QTC Calculation (Bezet): 483 ms
Ventricular Rate: 82 {beats}/min

## 2010-12-24 MED ORDER — SIMVASTATIN 20 MG TAB
20 mg | ORAL_TABLET | ORAL | Status: DC
Start: 2010-12-24 — End: 2011-02-04

## 2011-02-04 MED ORDER — TRAMADOL 50 MG TAB
50 mg | ORAL_TABLET | Freq: Four times a day (QID) | ORAL | Status: DC | PRN
Start: 2011-02-04 — End: 2011-03-06

## 2011-02-04 NOTE — Progress Notes (Signed)
Pt in office today to establish care

## 2011-02-09 NOTE — Progress Notes (Signed)
Cathy [name] is a 60 y.o.  female and presents with Establish Care, Leg Pain and Cholesterol Problem      SUBJECTIVE:  Pt for the last few years has had left leg pain which has been evaluated by vascular. She is being evaluated for venous procedure. She has used compression stockings without any improvement in the leg pain.  Pt also has cardiomyopathy and is followed by cardiology.  She has had HTN and high cholesterol for many years but they are well controlled on current meds.         Respiratory ROS: negative for - shortness of breath  Cardiovascular ROS: negative for - chest pain    Current Outpatient Prescriptions   Medication Sig   ??? traMADol (ULTRAM) 50 mg tablet Take 1 Tab by mouth every six (6) hours as needed for Pain.   ??? aspirin (ASPIRIN) 325 mg tablet Take 325 mg by mouth every four (4) hours as needed.     ??? simvastatin (ZOCOR) 20 mg tablet TAKE ONE TABLET BY MOUTH NIGHTLY.   ??? carvedilol (COREG) 6.25 mg tablet Take 6.25 mg by mouth two (2) times a day.         OBJECTIVE:  alert, well appearing, and in no distress  BP 126/80   Pulse 90   Temp(Src) 98 ??F (36.7 ??C) (Oral)   Resp 12   Ht 5\' 10"  (1.778 m)   Wt 208 lb (94.348 kg)   BMI 29.84 kg/m2   SpO2 97%   well developed and well nourished  Eyes - pupils equal and reactive, extraocular eye movements intact, sclera anicteric  Neck - supple, no significant adenopathy, carotids upstroke normal bilaterally, no bruits  Chest - clear to auscultation, no wheezes, rales or rhonchi, symmetric air entry  Heart - normal rate, regular rhythm, normal S1, S2, no murmurs, rubs, clicks or gallops  Extremities - varicose veins noted, left leg  Skin - normal coloration and turgor, no rashes, no suspicious skin lesions noted    Labs:     Component Value Date/Time   Cholesterol, total 175 02/06/2010 11:48 AM   HDL Cholesterol 61 02/06/2010 11:48 AM   LDL, calculated 105 02/06/2010 11:48 AM   Triglyceride 43 02/06/2010 11:48 AM    CHOL/HDL Ratio 3.4 11/23/2008 11:55 AM       Component Value Date/Time   ALT 38 12/15/2010  2:00 PM   AST 17 12/15/2010  2:00 PM   Alk. phosphatase 123 12/15/2010  2:00 PM   Bilirubin, direct 0.1 06/07/2008 10:46 AM   Bilirubin, total 0.3 12/15/2010  2:00 PM       Component Value Date/Time   GFR est AA >60 12/15/2010  2:00 PM   GFR est non-AA >60 12/15/2010  2:00 PM   Creatinine 0.8 12/15/2010  2:00 PM   BUN 13 12/15/2010  2:00 PM   Sodium 140 12/15/2010  2:00 PM   Potassium 4.1 12/15/2010  2:00 PM   Chloride 105 12/15/2010  2:00 PM   CO2 33 12/15/2010  2:00 PM          Assessment/Plan    1. Leg pain, left  Pt to f/u with vascular surgery for further management  If pt wants a second opinion will refer to Dr Tanda Rockers   2. HTN (hypertension)  Well controlled on current meds METABOLIC PANEL, COMPREHENSIVE   3. Borderline high cholesterol  Reasonably well controlled on Zocor LIPID PANEL   4. Cardiomyopathy  Pt followed by cardiology.  5. Breast cancer screening  MAM MAMMO BI SCREENING DIGTL     Follow-up Disposition:  Return in about 3 months (around 05/05/2011) for labs 1 week before .    Reviewed plan of care. Patient has provided input and agrees with goals.

## 2011-03-06 NOTE — Progress Notes (Signed)
History of Present Illness:    [name] here for followup.  She underwent implantation of dual chamber [employer] AICD on 11/15/2010 without complication.  She had an episode in 11/2010 where she felt profoundly dizzy and diaphoretic.  It sounds like it may have been a blood pressure issue.  She was seen in the emergency department.  She was recently seen by Dr. Allena Katz and her device was interrogated at that time, which she tells me was normal function.  Overall, she is doing well.  She denies any new chest pain, dyspnea, PND, orthopnea or edema.  She does have some occasional point tenderness of her device when she pushes on it, but it appears to be well healed.      Impression:    1. Dual chamber [employer] AICD placed on 11/15/2010 for primary prevention.  She is doing well postoperatively and everything looks healed.   2.  Nonischemic cardiomyopathy with ejection fraction of 20%.   3.  Congestive heart failure, class 2, currently compensated.     Plan:  From a cardiac standpoint, she appears to be doing well.  Historically, she has not been on ACE inhibitors due to concerns for hypotension.  She is on beta-blockers and tolerating without difficulty.  She will continue follow up with Dr. Gracy Bruins and I will be available if there are any issues.  Please do not hesitate to contact me in the future and thank you for allowing me to participate in this patient's care.      Past Medical History   Diagnosis Date   ??? CHF (congestive heart failure) 06/23/2008   ??? 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 Fibroid 06/23/2008   ??? HTN (hypertension) 06/23/2008   ??? History of elevated lipids 06/23/2008   ??? Breast mass, left      benign   ??? OSA (obstructive sleep apnea) 9/10   ??? CAD (coronary artery disease)        Current Outpatient Prescriptions   Medication Sig Dispense Refill   ??? HYDROcodone-acetaminophen (NORCO) 5-325 mg per tablet Take  by mouth as needed.          ??? aspirin (ASPIRIN) 325 mg tablet Take 325 mg by mouth every four (4) hours as needed.         ??? simvastatin (ZOCOR) 20 mg tablet TAKE ONE TABLET BY MOUTH NIGHTLY.  30 Tab  0   ??? carvedilol (COREG) 6.25 mg tablet Take 6.25 mg by mouth two (2) times a day.           Social History   reports that she has never smoked. She has never used smokeless tobacco.   reports that she does not drink alcohol.    Family History  family history includes Cancer in an other family member; Diabetes in her daughter, mother, and another family member; Heart Attack in her father; Heart Disease in her maternal grandmother and paternal grandmother; Kidney Disease in an other family member; and Other in her daughter.    Review of Systems  Except as stated above include:  Constitutional: Negative for fever, chills and malaise/fatigue.   HEENT: Negative.   Gastrointestinal: Negative.  Musculoskeletal: Negative.  Neurological:  No localized symptoms.    PHYSICAL EXAM  BP Readings from Last 3 Encounters:   03/06/11 128/78   02/04/11 126/80   12/15/10 125/69     Pulse Readings from Last 3 Encounters:  03/06/11 91   02/04/11 90   12/15/10 81     Wt Readings from Last 3 Encounters:   03/06/11 89.54 kg (197 lb 6.4 oz)   02/04/11 94.348 kg (208 lb)   12/15/10 90.719 kg (200 lb)     General:   Well developed, well groomed.    Head/Neck:   No jugular venous distention     No carotid bruits.    No evidence of xanthelasma.  Lungs:   No respiratory distress.      Clear bilaterally.  Heart:   Regular rate and rhythm.  Normal S1/S2.      Palpation of heart with normal point of maximum impulse.    No significant murmurs, rubs or gallops.    Left sided AICD intact, no evidence infection, well healed  Abdomen:   Soft and nontender.      No palpable abdominal mass or bruits.  Extremities:   Intact peripheral pulses.      No significant edema.    Neurological:   Alert and oriented to person, place, time.      No focal neurological deficit visually.

## 2011-04-30 LAB — METABOLIC PANEL, COMPREHENSIVE
A-G Ratio: 1.7 (ref 1.1–2.5)
ALT (SGPT): 13 IU/L (ref 0–32)
AST (SGOT): 12 IU/L (ref 0–40)
Albumin: 4.1 g/dL (ref 3.6–4.8)
Alk. phosphatase: 119 IU/L (ref 25–165)
BUN/Creatinine ratio: 19 (ref 11–26)
BUN: 12 mg/dL (ref 8–27)
Bilirubin, total: 0.4 mg/dL (ref 0.0–1.2)
CO2: 28 mmol/L (ref 20–32)
Calcium: 9.2 mg/dL (ref 8.6–10.2)
Chloride: 101 mmol/L (ref 97–108)
Creatinine: 0.62 mg/dL (ref 0.57–1.00)
GFR est non-AA: 98 mL/min/{1.73_m2} (ref >59)
GLOBULIN, TOTAL: 2.4 g/dL (ref 1.5–4.5)
Glucose: 82 mg/dL (ref 65–99)
Potassium: 4.3 mmol/L (ref 3.5–5.2)
Protein, total: 6.5 g/dL (ref 6.0–8.5)
Sodium: 141 mmol/L (ref 134–144)
eGFR If African American: 113 mL/min/{1.73_m2} (ref >59)

## 2011-04-30 LAB — LIPID PANEL
Cholesterol, total: 172 mg/dL (ref 100–199)
HDL Cholesterol: 60 mg/dL (ref >39)
LDL, calculated: 102 mg/dL — ABNORMAL HIGH (ref 0–99)
Triglyceride: 49 mg/dL (ref 0–149)
VLDL, calculated: 10 mg/dL (ref 5–40)

## 2011-05-06 NOTE — Patient Instructions (Signed)
DASH Diet: After Your Visit  Your Care Instructions  The DASH diet is an eating plan that can help lower your blood pressure. DASH stands for Dietary Approaches to Stop Hypertension. Hypertension is high blood pressure.  The DASH diet focuses on eating foods that are high in calcium, potassium, and magnesium. These nutrients can lower blood pressure. The foods that are highest in these nutrients are fruits, vegetables, low-fat dairy products, nuts, seeds, and legumes. But taking calcium, potassium, and magnesium supplements instead of eating foods that are high in those nutrients does not have the same effect. The DASH diet also includes whole grains, fish, and poultry.  The DASH diet is one of several lifestyle changes your doctor may recommend to lower your high blood pressure. Your doctor may also want you to decrease the amount of sodium in your diet. Lowering sodium while following the DASH diet can lower blood pressure even further than just the DASH diet alone.  Follow-up care is a key part of your treatment and safety. Be sure to make and go to all appointments, and call your doctor if you are having problems. It???s also a good idea to know your test results and keep a list of the medicines you take.  How can you care for yourself at home?  Following the DASH diet  ?? Eat 4 to 5 servings of fruit each day. A serving is 1 medium-sized piece of fruit, ?? cup chopped or canned fruit, 1/4 cup dried fruit, or 4 ounces (?? cup) of fruit juice. Choose fruit more often than fruit juice.   ?? Eat 4 to 5 servings of vegetables each day. A serving is 1 cup of lettuce or raw leafy vegetables, ?? cup of chopped or cooked vegetables, or 4 ounces (?? cup) of vegetable juice. Choose vegetables more often than vegetable juice.   ?? Get 2 to 3 servings of low-fat and fat-free dairy each day. A serving is 8 ounces of milk, 1 cup of yogurt, or 1 ?? ounces of cheese.   ?? Eat 7 to 8 servings of grains each day. A serving is 1 slice of  bread, 1 ounce of dry cereal, or ?? cup of cooked rice, pasta, or cooked cereal. Try to choose whole-grain products as much as possible.   ?? Limit lean meat, poultry, and fish to 6 ounces each day. Six ounces is about the size of two decks of cards.   ?? Eat 4 to 5 servings of nuts, seeds, and legumes (cooked dried beans, lentils, and split peas) each week. A serving is 1/3 cup of nuts, 2 tablespoons of seeds, or ?? cup cooked dried beans or peas.   ?? Limit sweets and added sugars to 5 servings or less a week. A serving is 1 tablespoon jelly or jam, ?? cup sorbet, or 1 cup of lemonade.   Tips for success  ?? Start small. Do not try to make dramatic changes to your diet all at once. You might feel that you are missing out on your favorite foods and then be more likely to not follow the plan. Make small changes, and stick with them. Once those changes become habit, add a few more changes.   ?? Try some of the following:   ?? Make it a goal to eat a fruit or vegetable at every meal and at snacks. This will make it easy to get the recommended amount of fruits and vegetables each day.   ?? Try yogurt topped with   fruit and nuts for a snack or healthy dessert.   ?? Add lettuce, tomato, cucumber, and onion to sandwiches.   ?? Combine a ready-made pizza crust with low-fat mozzarella cheese and lots of vegetable toppings. Try using tomatoes, squash, spinach, broccoli, carrots, cauliflower, and onions.   ?? Have a variety of cut-up vegetables with a low-fat dip as an appetizer instead of chips and dip.   ?? Sprinkle sunflower seeds or chopped almonds over salads. Or try adding chopped walnuts or almonds to cooked vegetables.   ?? Try some vegetarian meals using beans and peas. Add garbanzo or kidney beans to salads. Make burritos and tacos with mashed pinto beans or black beans.     Where can you learn more?    Go to http://www.healthwise.net/[facility]   Enter H967 in the search box to learn more about "DASH Diet: After Your Visit."    ??  2006-2013 Health[employer]. Care instructions adapted under license by [facility] (which disclaims liability or warranty for this information). This care instruction is for use with your licensed healthcare professional. If you have questions about a medical condition or this instruction, always ask your healthcare professional. Healthwise, Incorporated disclaims any warranty or liability for your use of this information.  Content Version: 9.6.101520; Last Revised: May 09, 2010

## 2011-05-06 NOTE — Progress Notes (Signed)
Patient is in the office today for 3 month follow up.

## 2011-05-07 NOTE — Progress Notes (Signed)
Subjective:       Chief Complaint  The patient presents for follow up of hypertension and high cholesterol.        HPI  Cathy [name] is a 61 y.o. female seen for follow up of hyperlipidemia.  Shealso has hypertension. Hypertension well controlled, on current meds, hyperlipidemia reasonably well controlled, on Zocor.    Diet and Lifestyle: generally follows a low fat low cholesterol diet, exercises sporadically    Home BP Monitoring: is not measured at home.    Other symptoms and concerns: pt is followed by cardiology for cardiomyopathy.       Discussed the patient's BMI with her.  The BMI follow up plan is as follows: BMI is out of normal parameters and plan is as follows: I have counseled this patient on diet and exercise regimens.      Current Outpatient Prescriptions   Medication Sig   ??? aspirin (ASPIRIN) 325 mg tablet Take 325 mg by mouth every four (4) hours as needed.     ??? simvastatin (ZOCOR) 20 mg tablet TAKE ONE TABLET BY MOUTH NIGHTLY.   ??? carvedilol (COREG) 6.25 mg tablet Take 6.25 mg by mouth two (2) times a day.   ??? HYDROcodone-acetaminophen (NORCO) 5-325 mg per tablet Take  by mouth as needed.             Review of Systems  Respiratory: negative for dyspnea on exertion  Cardiovascular: negative for chest pain    Objective:     BP 128/82   Pulse 86   Temp(Src) 98.2 ??F (36.8 ??C) (Oral)   Resp 16   Ht 5\' 10"  (1.778 m)   Wt 210 lb (95.255 kg)   BMI 30.13 kg/m2   SpO2 98%     General appearance - alert, well appearing, and in no distress and overweight  Neck - supple, no significant adenopathy, carotids upstroke normal bilaterally, no bruits  Chest - clear to auscultation, no wheezes, rales or rhonchi, symmetric air entry  Heart - normal rate, regular rhythm, normal S1, S2, no murmurs, rubs, clicks or gallops  Extremities - peripheral pulses normal, no pedal edema, no clubbing or cyanosis  Skin - normal coloration and turgor, no rashes, no suspicious skin lesions noted      Labs:     Component Value  Date/Time   Cholesterol, total 172 04/29/2011 10:22 AM   HDL Cholesterol 60 04/29/2011 10:22 AM   LDL, calculated 102 04/29/2011 10:22 AM   Triglyceride 49 04/29/2011 10:22 AM   CHOL/HDL Ratio 3.4 11/23/2008 11:55 AM       Component Value Date/Time   ALT 13 04/29/2011 10:22 AM   AST 12 04/29/2011 10:22 AM   Alk. phosphatase 119 04/29/2011 10:22 AM   Bilirubin, direct 0.1 06/07/2008 10:46 AM   Bilirubin, total 0.4 04/29/2011 10:22 AM       Component Value Date/Time   GFR est AA >60 12/15/2010  2:00 PM   GFR est non-AA 98 04/29/2011 10:22 AM   Creatinine 0.62 04/29/2011 10:22 AM   BUN 12 04/29/2011 10:22 AM   Sodium 141 04/29/2011 10:22 AM   Potassium 4.3 04/29/2011 10:22 AM   Chloride 101 04/29/2011 10:22 AM   CO2 28 04/29/2011 10:22 AM            Assessment / Plan     Hypertension well controlled, on current meds  Hyperlipidemia borderline controlled, on Zocor 20 mg, pt will try to improve further with weight loss. Marland Kitchen  1. HTN (hypertension)  [id number] [facility] DRAW FEE, [id number], METABOLIC PANEL, COMPREHENSIVE   2. History of elevated lipids  LIPID PANEL             Low cholesterol diet, weight control and daily exercise discussed.     Follow-up Disposition:  Return in about 4 months (around 09/05/2011) for labs 1 week before .      Reviewed plan of care. Patient has provided input and agrees with goals.

## 2011-06-12 MED ORDER — SIMVASTATIN 20 MG TAB
20 mg | ORAL_TABLET | Freq: Every evening | ORAL | Status: DC
Start: 2011-06-12 — End: 2011-09-09

## 2011-06-12 NOTE — Telephone Encounter (Signed)
This is Dr. Tawni Levy patient.  Sent to me in error.

## 2011-07-28 MED ORDER — CARVEDILOL 6.25 MG TAB
6.25 mg | ORAL_TABLET | ORAL | Status: DC
Start: 2011-07-28 — End: 2011-12-12

## 2011-08-08 MED ORDER — FLUTICASONE 50 MCG/ACTUATION NASAL SPRAY, SUSP
50 mcg/actuation | Freq: Every day | NASAL | Status: DC
Start: 2011-08-08 — End: 2011-09-29

## 2011-08-08 MED ORDER — IBUPROFEN 800 MG TAB
800 mg | ORAL_TABLET | Freq: Three times a day (TID) | ORAL | Status: DC | PRN
Start: 2011-08-08 — End: 2011-11-11

## 2011-08-08 NOTE — Progress Notes (Signed)
Patient is in the office today for head congestion and sinus pain.  Patient states she is also having Left leg pain 10/10.

## 2011-08-08 NOTE — Progress Notes (Signed)
HISTORY OF PRESENT ILLNESS  Cathy [name] is a 61 y.o. female.  Sinus Pain   The history is provided by the patient. This is a new problem. The current episode started more than 1 week ago. The problem has not changed since onset.The pain has been fluctuating since onset. Associated symptoms include sinus pressure (behind right eye) and rhinorrhea. Pertinent negatives include no chills. She has tried nothing for the symptoms. The treatment provided no relief.   Leg Pain   The history is provided by the patient (pt has been having persistent left leg pain even after having a vascular surgery intervention and orthopedic evaluation. pt has numness in the left leg but she is unable to use ultracet due to nausea).       Review of Systems   Constitutional: Negative for chills.   HENT: Positive for rhinorrhea and sinus pressure (behind right eye).      Current Outpatient Prescriptions   Medication Sig   ??? traMADol-acetaminophen (ULTRACET) 37.5-325 mg per tablet Take 1 Tab by mouth nightly as needed.   ??? ibuprofen (MOTRIN) 800 mg tablet Take 1 Tab by mouth every eight (8) hours as needed for Pain.   ??? fluticasone (FLONASE) 50 mcg/actuation nasal spray 2 Sprays by Both Nostrils route daily.   ??? carvedilol (COREG) 6.25 mg tablet TAKE ONE TABLET BY MOUTH TWICE DAILY   ??? simvastatin (ZOCOR) 20 mg tablet Take 1 Tab by mouth nightly.   ??? aspirin (ASPIRIN) 325 mg tablet Take 325 mg by mouth every four (4) hours as needed.         Physical Exam   Vitals reviewed.  Constitutional: She appears well-developed and well-nourished.   HENT:   Head: Normocephalic and atraumatic.   Nose: Mucosal edema present. Right sinus exhibits frontal sinus tenderness.   Mouth/Throat: Oropharyngeal exudate present.   Eyes: Conjunctivae and EOM are normal. Pupils are equal, round, and reactive to light.   Cardiovascular: Normal rate, regular rhythm and normal heart sounds.    Pulmonary/Chest: Effort normal and breath sounds normal.       ASSESSMENT  and PLAN  1. Sinus headache  Will try fluticasone (FLONASE) 50 mcg/actuation nasal spray   2. Leg pain, left  Will hold  zOCOR FOR 2 WEEKS TO SEE IF ANY IMPROVEMENT. Will treat with ibuprofen (MOTRIN) 800 mg tablet TID prn for pain and stop ultracet.  If pain persist will refer to neurology for further evaluation.    3. AR (allergic rhinitis)  fluticasone (FLONASE) 50 mcg/actuation nasal spray     Follow-up Disposition:  Return in about 5 weeks (around 09/09/2011).

## 2011-09-03 LAB — METABOLIC PANEL, COMPREHENSIVE
A-G Ratio: 1.7 (ref 1.1–2.5)
ALT (SGPT): 14 IU/L (ref 0–32)
AST (SGOT): 12 IU/L (ref 0–40)
Albumin: 4.1 g/dL (ref 3.6–4.8)
Alk. phosphatase: 90 IU/L (ref 25–165)
BUN/Creatinine ratio: 21 (ref 11–26)
BUN: 12 mg/dL (ref 8–27)
Bilirubin, total: 0.6 mg/dL (ref 0.0–1.2)
CO2: 26 mmol/L (ref 19–28)
Calcium: 9 mg/dL (ref 8.6–10.2)
Chloride: 102 mmol/L (ref 97–108)
Creatinine: 0.56 mg/dL — ABNORMAL LOW (ref 0.57–1.00)
GFR est non-AA: 101 mL/min/{1.73_m2} (ref >59)
GLOBULIN, TOTAL: 2.4 g/dL (ref 1.5–4.5)
Glucose: 90 mg/dL (ref 65–99)
Potassium: 4.2 mmol/L (ref 3.5–5.2)
Protein, total: 6.5 g/dL (ref 6.0–8.5)
Sodium: 139 mmol/L (ref 134–144)
eGFR If African American: 116 mL/min/{1.73_m2} (ref >59)

## 2011-09-03 LAB — LIPID PANEL
Cholesterol, total: 229 mg/dL — ABNORMAL HIGH (ref 100–199)
HDL Cholesterol: 56 mg/dL (ref >39)
LDL, calculated: 164 mg/dL — ABNORMAL HIGH (ref 0–99)
Triglyceride: 47 mg/dL (ref 0–149)
VLDL, calculated: 9 mg/dL (ref 5–40)

## 2011-09-03 LAB — CVD REPORT: PDF IMAGE: 0

## 2011-09-08 NOTE — Progress Notes (Signed)
Reviewed record in preparation of visit and have obtained necessary documentation for physician.

## 2011-09-09 MED ORDER — MONTELUKAST 10 MG TAB
10 mg | ORAL_TABLET | Freq: Every evening | ORAL | Status: DC
Start: 2011-09-09 — End: 2011-12-12

## 2011-09-09 MED ORDER — ROSUVASTATIN 5 MG TAB
5 mg | ORAL_TABLET | Freq: Every evening | ORAL | Status: DC
Start: 2011-09-09 — End: 2012-03-10

## 2011-09-09 NOTE — Patient Instructions (Signed)
High Cholesterol: After Your Visit  Your Care Instructions  Cholesterol is a type of fat in your blood. It is needed for many body functions, such as making new cells. Cholesterol is made by your body and also comes from food you eat. High cholesterol means that you have too much of the fat in your blood.  LDL and HDL are part of your total cholesterol. LDL is the "bad" cholesterol that builds up inside the blood vessel walls, making them too narrow. This reduces the flow of blood and can cause a heart attack or stroke. HDL is the "good" cholesterol that helps clear bad cholesterol from the body. You want your good cholesterol to be high and your bad cholesterol to be low. If you do this, you can reduce your chance of having a heart attack or a stroke.  You can improve your cholesterol levels by eating less animal and trans fat and more vegetables. Getting regular exercise can also help. But for some people, cholesterol problems run in the family. If changes in diet and exercise don't improve your cholesterol levels, talk to your doctor about using medicine.  Follow-up care is a key part of your treatment and safety. Be sure to make and go to all appointments, and call your doctor if you are having problems. It???s also a good idea to know your test results and keep a list of the medicines you take.  How can you care for yourself at home?  ?? Eat a variety of foods every day. Good choices include fruits, vegetables, whole grains (like oatmeal), dried beans and peas, nuts and seeds, soy products (like tofu), and fat-free or low-fat dairy products.   ?? Replace butter, margarine, and hydrogenated or partially hydrogenated oils with olive and canola oils. (Canola oil margarine without trans fat is fine.)   ?? Replace red meat with fish, poultry, and soy protein (like tofu).   ?? Limit processed and packaged foods like chips, crackers, and cookies.   ?? Bake, broil, or steam foods instead of frying them.   ?? Limit foods high  in cholesterol, such as egg yolks.   ?? Be physically active. Exercise increases your HDL, or good cholesterol level. Get at least 30 minutes of exercise on most days of the week. Walking is a good choice. You also may want to do other activities, such as running, swimming, cycling, or playing tennis or team sports.   ?? Stay at a healthy weight or lose weight by making the changes in eating and physical activity presented above. Losing just a small amount of weight, even 5 to 10 pounds, can reduce your risk for having a heart attack or stroke.   When should you call for help?  Watch closely for changes in your health, and be sure to contact your doctor if:  ?? You need more help controlling your cholesterol.     Where can you learn more?    Go to http://www.healthwise.net/[facility]   Enter I865 in the search box to learn more about "High Cholesterol: After Your Visit."    ?? 2006-2013 [employer]. Care instructions adapted under license by [employer] (which disclaims liability or warranty for this information). This care instruction is for use with your licensed healthcare professional. If you have questions about a medical condition or this instruction, always ask your healthcare professional. [employer], Inc[employer] disclaims any warranty or liability for your use of this information.  Content Version: 9.7.130178; Last Revised: May 14, 2010

## 2011-09-09 NOTE — Progress Notes (Signed)
Patient is in the office today for 4 month follow up.  Patient states she is short of breath and wheezes in the morning sometimes.

## 2011-09-10 NOTE — Progress Notes (Signed)
Cathy [name] is a 61 y.o.  female and presents with Follow-up, Hypertension, Cholesterol Problem, Shortness of Breath, Leg Pain and Allergies      SUBJECTIVE:  Pt's HTN is well controlled on coreg. She denies any side effects with the medication. Her cholesterol is not well controlled and pt had some increase in her left leg pain while on Zocor.  Pt has for the last 2 weeks had increased SOB and wheezing as well as some nasal congestion. Pt left leg pain has also improved with Motrin.       Respiratory ROS: negative for - shortness of breath  Cardiovascular ROS: negative for - chest pain    Current Outpatient Prescriptions   Medication Sig   ??? rosuvastatin (CRESTOR) 5 mg tablet Take 1 Tab by mouth nightly.   ??? montelukast (SINGULAIR) 10 mg tablet Take 1 Tab by mouth nightly.   ??? traMADol-acetaminophen (ULTRACET) 37.5-325 mg per tablet Take 1 Tab by mouth nightly as needed.   ??? ibuprofen (MOTRIN) 800 mg tablet Take 1 Tab by mouth every eight (8) hours as needed for Pain.   ??? fluticasone (FLONASE) 50 mcg/actuation nasal spray 2 Sprays by Both Nostrils route daily.   ??? carvedilol (COREG) 6.25 mg tablet TAKE ONE TABLET BY MOUTH TWICE DAILY   ??? aspirin (ASPIRIN) 325 mg tablet Take 325 mg by mouth every four (4) hours as needed.           OBJECTIVE:  alert, well appearing, and in no distress  BP 120/72   Pulse 89   Temp(Src) 98.2 ??F (36.8 ??C) (Oral)   Resp 18   Ht 5\' 10"  (1.778 m)   Wt 210 lb (95.255 kg)   BMI 30.13 kg/m2   SpO2 98%   well developed and well nourished  Eyes - pupils equal and reactive, extraocular eye movements intact, sclera anicteric  Nose - mucosal congestion  Mouth - mucous membranes moist, pharynx normal without lesions  Neck - supple, no significant adenopathy, carotids upstroke normal bilaterally, no bruits  Chest - wheezing noted mild bilaterally  Heart - normal rate, regular rhythm, normal S1, S2, no murmurs, rubs, clicks or gallops  Extremities - pedal edema trace +  Skin - normal  coloration and turgor, no rashes, no suspicious skin lesions noted    Labs:   Lab Results   Component Value Date/Time    Cholesterol, total 229 09/02/2011  9:07 AM    HDL Cholesterol 56 09/02/2011  9:07 AM    LDL, calculated 164 09/02/2011  9:07 AM    Triglyceride 47 09/02/2011  9:07 AM    CHOL/HDL Ratio 3.4 11/23/2008 11:55 AM     Lab Results   Component Value Date/Time    ALT 14 09/02/2011  9:07 AM    AST 12 09/02/2011  9:07 AM    Alk. phosphatase 90 09/02/2011  9:07 AM    Bilirubin, direct 0.1 06/07/2008 10:46 AM    Bilirubin, total 0.6 09/02/2011  9:07 AM     Lab Results   Component Value Date/Time    GFR est-AA >60 12/15/2010  2:00 PM    GFR est non-AA 101 09/02/2011  9:07 AM    Creatinine 0.56 09/02/2011  9:07 AM    BUN 12 09/02/2011  9:07 AM    Sodium 139 09/02/2011  9:07 AM    Potassium 4.2 09/02/2011  9:07 AM    Chloride 102 09/02/2011  9:07 AM    CO2 26 09/02/2011  9:07 AM  Discussed the patient's BMI with her.  The BMI follow up plan is as follows: BMI is out of normal parameters and plan is as follows: I have counseled this patient on diet and exercise regimens.        Assessment/Plan    1. HTN (hypertension)  Well controlled on Coreg METABOLIC PANEL, COMPREHENSIVE   2. High cholesterol  Uncontrolled. Will start on rosuvastatin (CRESTOR) 5 mg tablet, LIPID PANEL   3. Leg pain, left  Improved with Motrin and off Zocor    4. Wheezing  Will treat with montelukast (SINGULAIR) 10 mg tablet   5. AR (allergic rhinitis)  Will treat with montelukast (SINGULAIR) 10 mg tablet and Flonase      Follow-up Disposition:  Return in about 2 weeks (around 09/23/2011) for Wheezing allergies .    Reviewed plan of care. Patient has provided input and agrees with goals.

## 2011-09-18 NOTE — Progress Notes (Signed)
HISTORY OF PRESENT ILLNESS  Cathy [name] is a 61 y.o. female.    CHF  The history is provided by the patient. This is a chronic problem. The problem occurs constantly. The problem has not changed since onset.Associated symptoms include shortness of breath. Pertinent negatives include no chest pain, no abdominal pain and no headaches.       Review of Systems   Constitutional: Negative for fever and chills.   HENT: Negative for nosebleeds.    Eyes: Negative for blurred vision and double vision.   Respiratory: Positive for shortness of breath. Negative for cough, hemoptysis, sputum production and wheezing.    Cardiovascular: Negative for chest pain, palpitations, orthopnea, claudication, leg swelling and PND.   Gastrointestinal: Negative for heartburn, nausea, vomiting and abdominal pain.   Musculoskeletal: Negative for myalgias.   Skin: Negative for rash.   Neurological: Negative for dizziness, weakness and headaches.   Endo/Heme/Allergies: Does not bruise/bleed easily.     Family History   Problem Relation Age of Onset   ??? Diabetes Mother    ??? Heart Attack Father      MI   ??? Heart Disease Maternal Grandmother    ??? Heart Disease Paternal Grandmother    ??? Kidney Disease Other      1 sib deceased   ??? Cancer Other      1 sib throat cancer   ??? Diabetes Other    ??? Diabetes Daughter    ??? Other Daughter      leukemia   ??? Hypertension Other        Past Medical History   Diagnosis Date   ??? CHF (congestive heart failure) 06/23/2008   ??? 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 Fibroid 06/23/2008   ??? HTN (hypertension) 06/23/2008   ??? History of elevated lipids 06/23/2008   ??? Breast mass, left      benign   ??? OSA (obstructive sleep apnea) 9/10   ??? CAD (coronary artery disease)    ??? Chronic systolic heart failure      Stable, no significant fluid overload C/o sob on exertion, nyha class 2-3, feels better Unable to take lisinopril due to low bp Will continue coreg   ??? Other primary  cardiomyopathies      Non ischemic, pt was taken off coreg by pmd, possibly related to side effect and low bp Continue restart of coreg as pt has ef of 20% i have discussed risk and benefit of icd implant with pt, she is agreeable now Pt off lisinopril due to lop bp, she does not want to try it again Refer to dr seutter for icd    ??? Automatic implantable cardiac defibrillator in situ      Post ddd icd Set up [facility]   ??? Hypotension, unspecified      Stable now, off lisinopril   ??? Other and unspecified hyperlipidemia      On zocor, followed by pmd       Past Surgical History   Procedure Date   ??? Hx tonsil and adenoidectomy    ??? Hx tubal ligation    ??? Hx total abdominal hysterectomy 1996   ??? Hx cholecystectomy    ??? Hx pacemaker      dual chamber icd       Allergies   Allergen Reactions   ??? Pcn (Penicillins) Itching     Itching bumps on hands and arms  Current Outpatient Prescriptions   Medication Sig   ??? rosuvastatin (CRESTOR) 5 mg tablet Take 1 Tab by mouth nightly.   ??? montelukast (SINGULAIR) 10 mg tablet Take 1 Tab by mouth nightly.   ??? ibuprofen (MOTRIN) 800 mg tablet Take 1 Tab by mouth every eight (8) hours as needed for Pain.   ??? fluticasone (FLONASE) 50 mcg/actuation nasal spray 2 Sprays by Both Nostrils route daily.   ??? carvedilol (COREG) 6.25 mg tablet TAKE ONE TABLET BY MOUTH TWICE DAILY   ??? traMADol-acetaminophen (ULTRACET) 37.5-325 mg per tablet Take 1 Tab by mouth nightly as needed.   ??? aspirin (ASPIRIN) 325 mg tablet Take 325 mg by mouth every four (4) hours as needed.         BP 124/69   Pulse 98   Ht 5\' 10"  (1.778 m)   Wt 93.441 kg (206 lb)   BMI 29.56 kg/m2      Physical Exam   Constitutional: She is oriented to person, place, and time. She appears well-developed and well-nourished.   HENT:   Head: Normocephalic and atraumatic.   Eyes: Conjunctivae are normal.   Neck: Neck supple. No JVD present. No tracheal deviation present. No thyromegaly present.   Cardiovascular: Normal rate and regular  rhythm.  PMI is not displaced.  Exam reveals no gallop, no S3 and no decreased pulses.    No murmur heard.  Pulmonary/Chest: No respiratory distress. She has no wheezes. She has no rales. She exhibits no tenderness.   Abdominal: Soft. There is no tenderness.   Musculoskeletal: She exhibits no edema.   Neurological: She is alert and oriented to person, place, and time.   Skin: Skin is warm.   Psychiatric: She has a normal mood and affect.     CARDIOLOGY STUDIES 08/25/2010 10/25/2008 07/25/2008 01/25/2008 02/24/2006   Myocardial Perfusion Scan Result - - FIXED INF AND REVERSIBLE ANT APICAL DEFECT - -   Cardiac Cath Result - - - - EF 25%, NO SIGNIFICANT CAD   Echocardiogram - Complete Result DILATED 20%, MILD MR - - EF 25% -   Doppler US Arterial Result - NL SCAN - - -         Assessment    1. Chronic systolic heart failure     stable,nyha class 3  not on acei/arb due to severly reduced bp with that   2. Cardiomyopathy     non ischemic   3. AICD (automatic cardioverter/defibrillator) present     normally functiong icd   4. Other and unspecified hyperlipidemia        There are no discontinued medications.    No orders of the defined types were placed in this encounter.       Follow-up Disposition:  Return in about 6 months (around 03/20/2012).

## 2011-09-18 NOTE — Patient Instructions (Signed)
MyChart Activation    Thank you for requesting access to MyChart. Please follow the instructions below to securely access and download your online medical record. MyChart allows you to send messages to your doctor, view your test results, renew your prescriptions, schedule appointments, and more.    How Do I Sign Up?    1. In your internet browser, go to [email or web address].  2. Click on the First Time User? Click Here link in the Sign In box. You will see the New Member Sign Up page.  3. Enter your MyChart Access Code exactly as it appears below. You will not need to use this code after you???ve completed the sign-up process. If you do not sign up before the expiration date, you must request a new code.    MyChart Access Code: Not generated  Current MyChart Status: Active (This is the date your MyChart access code will expire)    4. Enter the last four digits of your Social Security Number (xxxx) and Date of Birth (mm/dd/yyyy) as indicated and click Submit. You will be taken to the next sign-up page.  5. Create a MyChart ID. This will be your MyChart login ID and cannot be changed, so think of one that is secure and easy to remember.  6. Create a MyChart password. You can change your password at any time.  7. Enter your Password Reset Question and Answer. This can be used at a later time if you forget your password.   8. Enter your e-mail address. You will receive e-mail notification when new information is available in MyChart.  9. Click Sign Up. You can now view and download portions of your medical record.  10. Click the Download Summary menu link to download a portable copy of your medical information.    Additional Information    If you have questions, please visit the Frequently Asked Questions section of the MyChart website at [email or web address] Remember, MyChart is NOT to be used for urgent needs. For medical emergencies, dial 911.

## 2011-09-29 MED ORDER — FLUTICASONE 50 MCG/ACTUATION NASAL SPRAY, SUSP
50 mcg/actuation | Freq: Every day | NASAL | Status: DC
Start: 2011-09-29 — End: 2011-12-12

## 2011-09-29 MED ORDER — TRAMADOL 50 MG TAB
50 mg | ORAL_TABLET | Freq: Four times a day (QID) | ORAL | Status: DC | PRN
Start: 2011-09-29 — End: 2011-12-12

## 2011-09-29 NOTE — Progress Notes (Signed)
Cathy [name] is a 61 y.o.  female and presents with Follow-up, Wheezing, Allergies and Leg Pain      SUBJECTIVE:  Pt's sinus headache, wheezing and allergies are much improved with the addition of Singulair to Flonase. Her leg pain is better with changing from Zocor to crestor but she still has it and uses Ultram and Motrin with good pain relief.       Respiratory ROS: negative for - shortness of breath  Cardiovascular ROS: negative for - chest pain    Current Outpatient Prescriptions   Medication Sig   ??? traMADol (ULTRAM) 50 mg tablet Take 1 Tab by mouth every six (6) hours as needed for Pain.   ??? fluticasone (FLONASE) 50 mcg/actuation nasal spray 2 Sprays by Both Nostrils route daily.   ??? rosuvastatin (CRESTOR) 5 mg tablet Take 1 Tab by mouth nightly.   ??? montelukast (SINGULAIR) 10 mg tablet Take 1 Tab by mouth nightly.   ??? ibuprofen (MOTRIN) 800 mg tablet Take 1 Tab by mouth every eight (8) hours as needed for Pain.   ??? carvedilol (COREG) 6.25 mg tablet TAKE ONE TABLET BY MOUTH TWICE DAILY   ??? aspirin (ASPIRIN) 325 mg tablet Take 325 mg by mouth every four (4) hours as needed.           OBJECTIVE:  alert, well appearing, and in no distress  BP 110/70   Pulse 76   Temp(Src) 97.6 ??F (36.4 ??C) (Oral)   Resp 18   Ht 5\' 10"  (1.778 m)   Wt 207 lb (93.895 kg)   BMI 29.70 kg/m2   SpO2 98%   well developed and well nourished        Discussed the patient's BMI with her.  The BMI follow up plan is as follows: BMI is out of normal parameters and plan is as follows: I have counseled this patient on diet and exercise regimens.        Assessment/Plan    1. AR (allergic rhinitis)  Improved on fluticasone (FLONASE) 50 mcg/actuation nasal spray and Singulair    2. Sinus headache  fluticasone (FLONASE) 50 mcg/actuation nasal spray   3. Leg pain  Stable on traMADol (ULTRAM) 50 mg tablet and Motrin   4. High cholesterol  Pt tolerating Crestor check LIPID PANEL   5. HTN (hypertension)  Stable on current meds METABOLIC PANEL,  COMPREHENSIVE     Follow-up Disposition:  Return in about 3 months (around 12/30/2011) for labs 1 week bvefore .    Reviewed plan of care. Patient has provided input and agrees with goals.

## 2011-09-29 NOTE — Progress Notes (Signed)
Patient is in the office today for 2 weeks follow up.

## 2011-09-29 NOTE — Patient Instructions (Signed)
Allergies: After Your Visit  Your Care Instructions  Allergies occur when your body's defense system (immune system) overreacts to certain substances. The immune system treats a harmless substance as if it were a harmful germ or virus. Many things can cause this overreaction, including pollens, medicine, food, dust, animal dander, and mold.  Allergies can be mild or severe. Mild allergies can be managed with home treatment. But medicine may be needed to prevent problems.  Managing your allergies is an important part of staying healthy. Your doctor may suggest that you have allergy testing to help find out what is causing your allergies. When you know what things trigger your symptoms, you can avoid them. This can prevent allergy symptoms and other health problems.  For severe allergies that cause reactions that affect your whole body (anaphylactic reactions), your doctor may prescribe a shot of epinephrine (such as EpiPen) to carry with you in case you have a severe reaction. Learn how to give yourself the shot and keep it with you at all times. Make sure it is not expired.  Follow-up care is a key part of your treatment and safety. Be sure to make and go to all appointments, and call your doctor if you are having problems. It's also a good idea to know your test results and keep a list of the medicines you take.  How can you care for yourself at home?  ?? If you have been told by your doctor that dust or dust mites are causing your allergy, decrease the dust around your bed:   ?? Wash sheets, pillowcases, and other bedding in hot water every week.   ?? Use dust-proof covers for pillows, duvets, and mattresses. Avoid plastic covers because they tear easily and do not "breathe." Wash as instructed on the label.   ?? Do not use any blankets and pillows that you do not need.   ?? Use blankets that you can wash in your washing machine.   ?? Consider removing drapes and carpets, which attract and hold dust, from your  bedroom.   ?? If you are allergic to house dust and mites, do not use home humidifiers. Your doctor can suggest ways you can control dust and mites.   ?? Look for signs of cockroaches. Cockroaches cause allergic reactions. Use cockroach baits to get rid of them. Then, clean your home well. Cockroaches like areas where grocery bags, newspapers, empty bottles, or cardboard boxes are stored. Do not keep these inside your home, and keep trash and food containers sealed. Seal off any spots where cockroaches might enter your home.   ?? If you are allergic to mold, get rid of furniture, rugs, and drapes that smell musty. Check for mold in the bathroom.   ?? If you are allergic to outdoor pollen or mold spores, use air-conditioning. Change or clean all filters every month. Keep windows closed.   ?? If you are allergic to pollen, stay inside when pollen counts are high. Use a vacuum cleaner with a HEPA filter or a double-thickness filter at least two times each week.   ?? Stay inside when air pollution is bad. Avoid paint fumes, perfumes, and other strong odors.   ?? Avoid conditions that make your allergies worse. Stay away from smoke. Do not smoke or let anyone else smoke in your house. Do not use fireplaces or wood-burning stoves.   ?? If you are allergic to your pets, change the air filter in your furnace every month. Use high-efficiency filters.   ??   If you are allergic to pet dander, keep pets outside or out of your bedroom. Old carpet and cloth furniture can hold a lot of animal dander. You may need to replace them.   When should you call for help?  Call 911 anytime you think you may need emergency care. For example, call if:  ?? You have severe trouble breathing.   ?? You passed out (lost consciousness).   Note: After calling 911, use the allergy kit prescribed by your doctor if you have one.  Watch closely for changes in your health, and be sure to contact your doctor if:  ?? You need help controlling your allergies.   ?? You  have questions about allergy testing.   ?? You do not get better as expected.     Where can you learn more?    Go to http://www.healthwise.net/[facility]   Enter W171 in the search box to learn more about "Allergies: After Your Visit."    ?? 2006-2013 Healthwise, Inc[employer]. Care instructions adapted under license by [facility] (which disclaims liability or warranty for this information). This care instruction is for use with your licensed healthcare professional. If you have questions about a medical condition or this instruction, always ask your healthcare professional. Healthwise, Incorporated disclaims any warranty or liability for your use of this information.  Content Version: 9.7.130178; Last Revised: May 03, 2010

## 2011-11-04 ENCOUNTER — Inpatient Hospital Stay
Admit: 2011-11-04 | Discharge: 2011-11-11 | Disposition: A | Discharge status: Home Health | Payer: BLUE CROSS/BLUE SHIELD | Attending: Hospitalist | Admitting: Hospitalist

## 2011-11-04 DIAGNOSIS — D320 Benign neoplasm of cerebral meninges: Secondary | ICD-10-CM

## 2011-11-04 LAB — CBC WITH AUTOMATED DIFF
ABS. BASOPHILS: 0 10*3/uL (ref 0.0–0.06)
ABS. EOSINOPHILS: 0 10*3/uL (ref 0.0–0.4)
ABS. LYMPHOCYTES: 1.3 10*3/uL (ref 0.9–3.6)
ABS. MONOCYTES: 0.4 10*3/uL (ref 0.05–1.2)
ABS. NEUTROPHILS: 2.7 10*3/uL (ref 1.8–8.0)
BASOPHILS: 1 % (ref 0–2)
EOSINOPHILS: 1 % (ref 0–5)
HCT: 38.7 % (ref 35.0–45.0)
HGB: 12.2 g/dL (ref 12.0–16.0)
LYMPHOCYTES: 30 % (ref 21–52)
MCH: 29.6 PG (ref 24.0–34.0)
MCHC: 31.5 g/dL (ref 31.0–37.0)
MCV: 93.9 FL (ref 74.0–97.0)
MONOCYTES: 8 % (ref 3–10)
MPV: 11.4 FL (ref 9.2–11.8)
NEUTROPHILS: 60 % (ref 40–73)
PLATELET: 214 10*3/uL (ref 135–420)
RBC: 4.12 M/uL — ABNORMAL LOW (ref 4.20–5.30)
RDW: 12.8 % (ref 11.6–14.5)
WBC: 4.4 10*3/uL — ABNORMAL LOW (ref 4.6–13.2)

## 2011-11-04 LAB — DRUG SCREEN, URINE (HBV ONLY)
AMPHETAMINES: NEGATIVE
BARBITURATES: NEGATIVE
BENZODIAZEPINES: NEGATIVE
COCAINE: NEGATIVE
OPIATES: NEGATIVE
PCP(PHENCYCLIDINE): NEGATIVE
THC (TH-CANNABINOL): NEGATIVE
TRICYCLICS: NEGATIVE

## 2011-11-04 LAB — URINE MICROSCOPIC ONLY
Bacteria: NEGATIVE /HPF
RBC: 4 /HPF (ref 0–5)
WBC: 0 /HPF (ref 0–4)

## 2011-11-04 LAB — METABOLIC PANEL, BASIC
Anion gap: 11 mmol/L (ref 3.0–18)
BUN/Creatinine ratio: 13 (ref 12–20)
BUN: 13 MG/DL (ref 7.0–18)
CO2: 25 MMOL/L (ref 21–32)
Calcium: 8.4 MG/DL — ABNORMAL LOW (ref 8.5–10.1)
Chloride: 105 MMOL/L (ref 100–108)
Creatinine: 1 MG/DL (ref 0.6–1.3)
GFR est AA: >60 mL/min/{1.73_m2} (ref >60)
GFR est non-AA: 60 mL/min/{1.73_m2} — ABNORMAL LOW (ref >60)
Glucose: 132 MG/DL — ABNORMAL HIGH (ref 74–99)
Potassium: 4.2 MMOL/L (ref 3.5–5.5)
Sodium: 141 MMOL/L (ref 136–145)

## 2011-11-04 LAB — URINALYSIS W/ RFLX MICROSCOPIC
Bilirubin: NEGATIVE
Glucose: NEGATIVE MG/DL
Ketone: NEGATIVE MG/DL
Leukocyte Esterase: NEGATIVE
Nitrites: NEGATIVE
Protein: 30 MG/DL — AB
Specific gravity: >1.03 — ABNORMAL HIGH (ref 1.003–1.030)
Urobilinogen: 0.2 EU/DL (ref 0.2–1.0)
pH (UA): 5.5 (ref 5.0–8.0)

## 2011-11-04 LAB — ETHYL ALCOHOL: ALCOHOL(ETHYL),SERUM: <3 MG/DL (ref 0–3)

## 2011-11-04 LAB — CARDIAC PANEL,(CK, CKMB & TROPONIN)
CK - MB: <0.5 ng/ml — ABNORMAL LOW (ref 0.5–3.6)
CK: 41 U/L (ref 26–192)
Troponin-I, QT: <0.02 NG/ML (ref 0.00–0.06)

## 2011-11-04 LAB — GLUCOSE, POC: Glucose (POC): 144 mg/dL — ABNORMAL HIGH (ref 70–110)

## 2011-11-04 LAB — AMMONIA: Ammonia, plasma: 47 umol/L — ABNORMAL HIGH (ref 11–32)

## 2011-11-04 MED ADMIN — ondansetron (ZOFRAN ODT) tablet 8 mg: ORAL | @ 13:00:00 | NDC 68462015840

## 2011-11-04 MED ADMIN — albuterol-ipratropium (DUO-NEB) 2.5 MG-0.5 MG/3 ML: RESPIRATORY_TRACT | @ 12:00:00 | NDC 00487020101

## 2011-11-04 MED ADMIN — dexamethasone (DECADRON) 4 mg/mL injection 10 mg: INTRAVENOUS | @ 15:00:00 | NDC 63323016501

## 2011-11-04 MED ADMIN — phenytoin (DILANTIN) 1,350 mg in 0.9% sodium chloride 250 mL IVPB: INTRAVENOUS | @ 15:00:00 | NDC 00641255541

## 2011-11-04 MED ADMIN — ioversol (OPTIRAY) 320 mg iodine/mL contrast injection 1-125 mL: INTRAVENOUS | @ 19:00:00 | NDC 00019132327

## 2011-11-04 MED FILL — ONDANSETRON 8 MG TAB, RAPID DISSOLVE: 8 mg | ORAL | Qty: 1

## 2011-11-04 MED FILL — IPRATROPIUM-ALBUTEROL 2.5 MG-0.5 MG/3 ML NEB SOLUTION: 2.5 mg-0.5 mg/3 ml | RESPIRATORY_TRACT | Qty: 3

## 2011-11-04 MED FILL — BD POSIFLUSH NORMAL SALINE 0.9 % INJECTION SYRINGE: INTRAMUSCULAR | Qty: 10

## 2011-11-04 MED FILL — PHENYTOIN SODIUM 50 MG/ML IV: 50 mg/mL | INTRAVENOUS | Qty: 30

## 2011-11-04 MED FILL — FLUTICASONE 50 MCG/ACTUATION NASAL SPRAY, SUSP: 50 mcg/actuation | NASAL | Qty: 16

## 2011-11-04 MED FILL — DEXAMETHASONE SODIUM PHOSPHATE 4 MG/ML IJ SOLN: 4 mg/mL | INTRAMUSCULAR | Qty: 3

## 2011-11-04 MED FILL — OPTIRAY 320 MG IODINE/ML INTRAVENOUS SYRINGE: 320 mg iodine/mL | INTRAVENOUS | Qty: 125

## 2011-11-04 NOTE — ED Notes (Signed)
Pt and family agree to admission to [facility].

## 2011-11-04 NOTE — ED Notes (Signed)
Came from [facility]- altered mental status since last night

## 2011-11-04 NOTE — ED Provider Notes (Addendum)
[facility] Emergency Department      61 y.o. female with Hx of AICD, sleep apnea, hypothyroidism, CAD, CHF presents to the ED for evaluation of AMS.  Pt family called EMS for pt with decreased responsiveness. Pt states that she has been generally weak x 3 days. Pt denies fever, cough, SOB. Pt???s husband reported that pt c/o leg pain last night; pt states that she only took Tramadol. Pt with no other complaints at this time.     No other complaints.     Current Facility-Administered Medications   Medication Dose Route Frequency   ??? albuterol (PROVENTIL VENTOLIN) 2.5 mg /3 mL (0.083 %) nebulizer solution       ??? albuterol-ipratropium (DUO-NEB) 2.5 MG-0.5 MG/3 ML  3 mL Nebulization ONCE   ??? ondansetron (ZOFRAN ODT) tablet 8 mg  8 mg Oral Q8H PRN     Current Outpatient Prescriptions   Medication Sig   ??? traMADol (ULTRAM) 50 mg tablet Take 1 Tab by mouth every six (6) hours as needed for Pain.   ??? fluticasone (FLONASE) 50 mcg/actuation nasal spray 2 Sprays by Both Nostrils route daily.   ??? rosuvastatin (CRESTOR) 5 mg tablet Take 1 Tab by mouth nightly.   ??? montelukast (SINGULAIR) 10 mg tablet Take 1 Tab by mouth nightly.   ??? ibuprofen (MOTRIN) 800 mg tablet Take 1 Tab by mouth every eight (8) hours as needed for Pain.   ??? carvedilol (COREG) 6.25 mg tablet TAKE ONE TABLET BY MOUTH TWICE DAILY   ??? aspirin (ASPIRIN) 325 mg tablet Take 325 mg by mouth every four (4) hours as needed.         Past Medical History   Diagnosis Date   ??? CHF (congestive heart failure) 06/23/2008   ??? 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 Fibroid 06/23/2008   ??? HTN (hypertension) 06/23/2008   ??? History of elevated lipids 06/23/2008   ??? Breast mass, left      benign   ??? OSA (obstructive sleep apnea) 9/10   ??? CAD (coronary artery disease)    ??? Chronic systolic heart failure      Stable, no significant fluid overload C/o sob on exertion, nyha class 2-3, feels better Unable to take lisinopril due to low  bp Will continue coreg   ??? Other primary cardiomyopathies      Non ischemic, pt was taken off coreg by pmd, possibly related to side effect and low bp Continue restart of coreg as pt has ef of 20% i have discussed risk and benefit of icd implant with pt, she is agreeable now Pt off lisinopril due to lop bp, she does not want to try it again Refer to dr seutter for icd    ??? Automatic implantable cardiac defibrillator in situ      Post ddd icd Set up carelink   ??? Hypotension, unspecified      Stable now, off lisinopril   ??? Other and unspecified hyperlipidemia      On zocor, followed by pmd       Family History   Problem Relation Age of Onset   ??? Diabetes Mother    ??? Heart Attack Father      MI   ??? Heart Disease Maternal Grandmother    ??? Heart Disease Paternal Grandmother    ??? Kidney Disease Other      1 sib deceased   ??? Cancer Other  1 sib throat cancer   ??? Diabetes Other    ??? Diabetes Daughter    ??? Other Daughter      leukemia   ??? Hypertension Other        History     Social History   ??? Marital Status: MARRIED     Spouse Name: N/A     Number of Children: N/A   ??? 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 Smoking status: Never Smoker    ??? Smokeless tobacco: Never Used   ??? Alcohol Use: No   ??? Drug Use: No   ??? Sexually Active: Not on file     Other Topics Concern   ??? Not on file     Social History Narrative   ??? No narrative on file       Allergies   Allergen Reactions   ??? Pcn (Penicillins) Itching     Itching bumps on hands and arms       REVIEW OF SYSTEMS:    Constitutional:  Negative for diaphoresis.  HENT:  Negative for congestion.    Respiratory:  Negative for cough and shortness of breath.    Cardiovascular:  Negative for chest pain and palpitations.   Gastrointestinal:  Negative for diarrhea.  Genitourinary:  Negative for flank pain.   Musculoskeletal:  Negative for back pain.   Skin:  Negative for pallor.   Neurological:  Negative for focal  numbness, weakness or tingling.  Negative for slurred speech.    Visit Vitals   Item Reading   ??? BP 121/73   ??? Pulse 82   ??? Temp 99.3 ??F (37.4 ??C)   ??? Resp 21   ??? Ht 5\' 10"  (1.778 m)   ??? Wt 89.359 kg (197 lb)   ??? BMI 28.27 kg/m2   ??? SpO2 94%       PHYSICAL EXAM:    CONSTITUTIONAL:  Alert, in no apparent distress;  well developed;  well nourished.  HEAD:  Normocephalic, atraumatic.  EYES:  EOMI.  Non-icteric sclera.  Normal conjunctiva.  ENTM:  Nose:  no rhinorrhea.  Throat:  no erythema or exudate, mucous membranes moist.  NECK:  No JVD.  Supple  RESPIRATORY:  Chest clear, equal breath sounds, good air movement.  CARDIOVASCULAR:  No murmurs, rubs, or gallops.  GI:  Normal bowel sounds, abdomen soft and non-tender.  No rebound or guarding.  BACK:  Non-tender.  UPPER EXT:  Normal inspection.  LOWER EXT:  No edema, no calf tenderness.  Distal pulses intact.  NEURO:  Moves all four extremities.  Normal motor exam and sensation in all four extremities.  Normal CN II-XII exam.  Normal bilateral finger-to-nose exam.  Upon initial MD exam, slow to answer questions, but answers them appropriately.    SKIN:  No rashes;  Normal for age.  PSYCH:  Alert and normal affect.    DIFFERENTIAL DIAGNOSES/ MEDICAL DECISION MAKING:   Dehydration, hyperglycemia-induced weakness, electrolyte and/or endocrine imbalance, CVA, intracranial hemorrhage, sepsis, cardiac arrhythmia, central versus peripheral vertigo, illicit drug intoxication, alcohol intoxication, prescribed drug toxicity, pregnancy in females patients, anxiety disorder, versus other etiologies or a combination of the above.    ED COURSE:      Recent Results (from the past 12 hour(s))   EKG, 12 LEAD, INITIAL    Collection Time    11/04/11  8:01 AM  Component Value Range    Ventricular Rate 88      Atrial Rate 88      P-R Interval 180      QRS Duration 102      Q-T Interval 386      QTC Calculation (Bezet) 467      Calculated P Axis 57      Calculated R Axis -8       Calculated T Axis 156      Diagnosis        Value: Normal sinus rhythm      Left ventricular hypertrophy with repolarization abnormality      Abnormal ECG      When compared with ECG of 15-Dec-2010 13:11,      No significant change was found   GLUCOSE, POC    Collection Time    11/04/11  8:13 AM       Component Value Range    Glucose - (glucometer POC) 144 (*) 70 - 110 mg/dL   CBC WITH AUTOMATED DIFF    Collection Time    11/04/11  8:15 AM       Component Value Range    WBC 4.4 (*) 4.6 - 13.2 K/uL    RBC 4.12 (*) 4.20 - 5.30 M/uL    HGB 12.2  12.0 - 16.0 g/dL    HCT 81.1  91.4 - 78.2 %    MCV 93.9  74.0 - 97.0 FL    MCH 29.6  24.0 - 34.0 PG    MCHC 31.5  31.0 - 37.0 g/dL    RDW 95.6  21.3 - 08.6 %    PLATELET 214  135 - 420 K/uL    MPV 11.4  9.2 - 11.8 FL    NEUTROPHILS 60  40 - 73 %    LYMPHOCYTES 30  21 - 52 %    MONOCYTES 8  3 - 10 %    EOSINOPHILS 1  0 - 5 %    BASOPHILS 1  0 - 2 %    ABS. NEUTROPHILS 2.7  1.8 - 8.0 K/UL    ABS. LYMPHOCYTES 1.3  0.9 - 3.6 K/UL    ABS. MONOCYTES 0.4  0.05 - 1.2 K/UL    ABS. EOSINOPHILS 0.0  0.0 - 0.4 K/UL    ABS. BASOPHILS 0.0  0.0 - 0.06 K/UL    DF AUTOMATED     METABOLIC PANEL, BASIC    Collection Time    11/04/11  8:15 AM       Component Value Range    Sodium 141  136 - 145 MMOL/L    Potassium 4.2  3.5 - 5.5 MMOL/L    Chloride 105  100 - 108 MMOL/L    CO2 25  21 - 32 MMOL/L    Anion gap 11  3.0 - 18 mmol/L    Glucose 132 (*) 74 - 99 MG/DL    BUN 13  7.0 - 18 MG/DL    Creatinine 5.78  0.6 - 1.3 MG/DL    BUN/Creatinine ratio 13  12 - 20      GFR est AA >60  >60 ml/min/1.76m2    GFR est non-AA 60 (*) >60 ml/min/1.17m2    Calcium 8.4 (*) 8.5 - 10.1 MG/DL   CARDIAC PANEL,(CK, CKMB & TROPONIN)    Collection Time    11/04/11  8:15 AM       Component Value Range    CK 41  26 - 192 U/L  CK - MB <0.5 (*) 0.5 - 3.6 ng/ml    CK-MB Index CANNOT BE CALCULATED  0.0 - 4.0 %    Troponin-I, Qt. <0.02  0.00 - 0.06 NG/ML   URINALYSIS W/ RFLX MICROSCOPIC    Collection Time    11/04/11  8:15 AM        Component Value Range    Color YELLOW      Appearance CLEAR      Specific gravity >1.030 (*) 1.003 - 1.030      pH 5.5  5.0 - 8.0      Protein 30 (*) NEGATIVE MG/DL    Glucose NEGATIVE   NEGATIVE MG/DL    Ketone NEGATIVE   NEGATIVE MG/DL    Bilirubin NEGATIVE   NEGATIVE    Blood MODERATE (*) NEGATIVE    Urobilinogen 0.2  0.2 - 1.0 EU/DL    Nitrites NEGATIVE   NEGATIVE    Leukocyte Esterase NEGATIVE   NEGATIVE   DRUG SCREEN, URINE (HBV ONLY)    Collection Time    11/04/11  8:15 AM       Component Value Range    PCP(PHENCYCLIDINE) NEGATIVE   NEGATIVE    THC (TH-CANNABINOL) NEGATIVE   NEGATIVE    AMPHETAMINE NEGATIVE   NEGATIVE    BARBITURATES NEGATIVE   NEGATIVE    BENZODIAZEPINE NEGATIVE   NEGATIVE    COCAINE NEGATIVE   NEGATIVE    OPIATES NEGATIVE   NEGATIVE    TRICYCLICS NEGATIVE   NEGATIVE    HDSCOM        Value: Specimen analysis was performed without chain of custody handling.  These results should be used for medical purposes only and not for legal or employment purposes.  Unconfirmed screening results must not be used for non-medical purposes.   URINE MICROSCOPIC ONLY    Collection Time    11/04/11  8:15 AM       Component Value Range    WBC 0 to 3  0 - 4 /HPF    RBC 4 to 10  0 - 5 /HPF    Epithelial cells FEW  0 - 5 /LPF    Bacteria NEGATIVE   NEGATIVE /HPF     CT head per radiologist's report:  FINDINGS:   Lobular large mass in the right frontal lobe, likely intra-axial. The mass   measures 6.3 x 5.5 cm and demonstrate scattered regions of internal   calcification. The mass causes adjacent edema within the right frontal lobe.   The mass abuts the anterior falx cerebri and displaces the anterior midline 10   mm to the left. The mass also displaces the frontal horns of the right greater   than left lateral ventricles posteriorly with compression. This mass effect and   shift likely causes obstruction of the left foramen of Monroe with mild   asymmetric prominence of the remaining left lateral ventricle, although    currently without significant degree of hydrocephalus. There is a small region   of edema along the right lateral frontal lobe separate from the main region of   the mass that may indicate a second site of a small lesion not currently well   seen on this exam. No intracranial hemorrhage. The mastoid air cells are clear.   There is a round bony structure measuring 16 x 15 mm in the central frontal   sinuses. Paranasal sinuses are otherwise clear. Sclerotic focus at the right   base of skull sphenoid bone that could  represent a bone island.     IMPRESSION:   6.3 cm solid partially calcified mass in the right frontal lobe creating edema   and mass effect upon the anterior falx cerebri and frontal ventricles and 10 mm   of midline shift to the left. 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 Early developing dilation of the   left lateral ventricle likely from obstruction at the foramen of Monroe by   lesion mass effect. The location of the mass and edema raises the possibility   of developing or future subfalcine herniation.   Mild focus of edema at the lateral right frontal lobe that might indicate a   second small unseen small mass.   Indeterminate osseous nodule in the frontal sinus, possibly osteoma. Attention   can be paid at time of MRI.   Critical findings discussed with Dr. Westley Foots on 11/04/11 at 9:35 AM.    8:20 AM  EKG reading by Suzanna Obey, MD:  NSR about 95 bpm with normal width QRS.  No STEMI.  No ST depression.    The patient???s vertigo and, if present, nausea and vomiting, was controlled with the noted medications given in the emergency department. While unlikely, will assess the posterior fossa for hemorrhage with CT scan and will order routine labs seeking occult infection, metabolic deranagment or evidence of anemia, acute worsening renal impairment, ACS/AMI (doubt), etc. My initial bedside impression is that this workup will likely be  unremarkable and that the patient will ultimately be able to be discharged from the ED and treated for benign positional vertigo.    No signs or complaints of vertigo component to patient???s weakness.     IMPRESSION AND MEDICAL DECISION MAKING:  Based upon the patient's presentation with noted HPI and PE, along with the work up done in the emergency department, I believe that the patient is having weakness of uncertain etiology.  I am comfortable with discharge of the patient home and outpatient follow up with the patient???s primary care physician.     9:20 AM  Upon serial exams, pt is much more alert, is awake, answers questions appropriately, and is at baseline mental status per husband now at bedside.  In addition, however, pt notes intermittent nausea and vomiting the last few weeks, but only after taking tramadol.     Consult:  Discussed care with Dr. Kemper Durie at [facility] ED. Standard discussion; including history of patient???s chief complaint, available diagnostic results, and treatment course. He will accept pt for transfer, requests consult by Dr. Harlene Ramus.   9:48 AM, 11/04/2011     Consult:  Discussed care with Dr. Ivy Lynn office staff. Standard discussion; including history of patient???s chief complaint, available diagnostic results, and treatment course. He will call back to discuss pt.  9:49 AM, 11/04/2011     Spoke to Dr. Javier Docker about patient and he will accept him but wants pt admitted to medicine team at [facility].  Will transfer to Dr. Kemper Durie who accepts pt for ED to ED transfer.  Will give decadron 10mg  IV and dilantin 15 mg/kg IV as requested by Dr. Alphonzo Severance.    Critical Care Note:  Critical care minutes: 35 MINUTES.     Given patients presentation to ed with noted change in mental status, numerous serial neurological examinations were performed, as well as evaluation of vital signs.          Given the patients underlying condition, requiring numerous reevaluations of patient's vital  signs  and response to different emergency department therapies, total bedside time evaluating and/or treating the patient, not including procedures, is noted above.      DIAGNOSIS:  1. Weakness.   2. Nausea and vomiting  3. Possible adverse reaction to tramadol.     SPECIFIC PATIENT INSTRUCTIONS FROM THE PHYSICIAN WHO TREATED YOU IN THE ER TODAY:  1. Return if any concerns or worsening of condition(s).  2. FOLLOW UP APPOINTMENT:  Your primary doctor in 1-2 days.     Arlys John L. Westley Foots, M.D.  AB[facility] Board Certified Emergency Physician    Scribe attestation/documentation statement  Provider documentation written by Ardeth Perfect, MD 8:17 AM  Acting as scribe for Dr. Ardeth Perfect, MD     I have reviewed the information recorded by the scribe and agree with its contents. Ardeth Perfect, MD  8:17 AM

## 2011-11-04 NOTE — Consults (Signed)
Neurosurgery Consult   61 y.o. Female with several months of right frontal headache, found unresponsive this AM and brought to [facility] where CT positive for right frontal tumor and sent to [facility]  PMH- h/o pacer for CHF and now on Coreg. Denies HTN,DM or other significant medical problems. Allergic to PCN  PE- awake and alert and oriented x 3. CN 2-12 normal except for left hemianopsia. Motor exam normal except for left pronator drift and sensory and cerebellar normal.   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

## 2011-11-04 NOTE — Other (Signed)
TRANSFER - OUT REPORT:    Verbal report given to Luanne [place], RN (name) on Cathy [name]  being transferred to [facility] ED(unit) for routine progression of care       Report consisted of patient???s Situation, Background, Assessment and   Recommendations(SBAR).     Information from the following report(s) ED Summary, MAR and Recent Results was reviewed with the receiving nurse.    Opportunity for questions and clarification was provided.

## 2011-11-04 NOTE — ED Notes (Signed)
Bedside and Verbal shift change report given to Dana Miller (oncoming nurse) by Brendan Gruwell (offgoing nurse).  Report given with ED Summary.

## 2011-11-04 NOTE — ED Notes (Signed)
Husband states that him and pt were sleeping when he felt pt "jump" in the bed. Husband states that when he rolled over, pt was diaphoretic, was having difficulty breathing and would not respond to him. EMS was called. Husband states that pt opened her eyes when she was moved to EMS stretcher however did not respond verbally. Husband states that pt is suppose to used CPAP at home at night however stopped using it 1 year ago because she "didn't like it". Husband also states that pt was c/o left leg pain last night. Pt hashx of chronic leg pain which she takes tramadol for. Pt is awake and oriented to self and place however does not remember how she arrived at ED.

## 2011-11-04 NOTE — Other (Signed)
TRANSFER - IN REPORT:    Verbal report received fromAhmie Larene Pickett RNon Cathy [name]  being received from ED-8(unit) for routine progression of care      Report consisted of patient???s Situation, Background, Assessment and   Recommendations(SBAR).     Information from the following report(s) SBAR was reviewed with the receiving nurse.    Opportunity for questions and clarification was provided.      Assessment completed upon patient???s arrival to unit and care assumed. Patient in CTat this time . Per report PIV not functional and that CT attempting line. Verbal report given.Per report patient more alert than upon arrival. Patient also received Dilanitin IV that was completed prior. Awaiting patients return to assess.

## 2011-11-04 NOTE — H&P (Signed)
Medicine History and Physical    Patient: Cathy [name]   Age:  61 y.o.    Chief Complaint:   Chief Complaint   Patient presents with   ??? Altered mental status       PCP: Dr. Kennon Rounds    Code Status: FULL    HPI:   PAISLEA ANHORN is a 61 year old woman with a past medical history notable for CHF, hypothyroidism, fibroids, HTN, left breast mass, OSA, and CAD who was transferred to this facility from [facility] with a new brain tumor. As per her husband the patient "woke up" early this morning at about 07:00 and sat up in bed - her husband felt that she was trying to say something but could not speak and then she began "posturing and shaking" her right arm.  He thought she was having a stroke and called EMS.  The patient has no memory of any of the days events.  She is currently sedated.  As per notes - On ED arrival, Medics reported that they noted a change in the patient's mental status and a drop in her BP with a systolic of 80. The patient's condition was discussed to the ER physician, Llana Aliment, DO by Dr Alphonzo Severance, Neurology and by the ER physician at [facility], Dr Westley Foots. Dr Alphonzo Severance advised administering Dilantin and Decadron and to order a MRI with and without contrast. The patient is to be admitted to the hospitalists.    .    Past Medical History:  Past Medical History   Diagnosis Date   ??? CHF (congestive heart failure) 06/23/2008   ??? 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 Fibroid 06/23/2008   ??? HTN (hypertension) 06/23/2008   ??? History of elevated lipids 06/23/2008   ??? Breast mass, left      benign   ??? OSA (obstructive sleep apnea) 9/10   ??? CAD (coronary artery disease)    ??? Chronic systolic heart failure      Stable, no significant fluid overload C/o sob on exertion, nyha class 2-3, feels better Unable to take lisinopril due to low bp Will continue coreg   ??? Other primary cardiomyopathies      Non ischemic, pt was taken off coreg by  pmd, possibly related to side effect and low bp Continue restart of coreg as pt has ef of 20% i have discussed risk and benefit of icd implant with pt, she is agreeable now Pt off lisinopril due to lop bp, she does not want to try it again Refer to dr seutter for icd    ??? Automatic implantable cardiac defibrillator in situ      Post ddd icd Set up carelink   ??? Hypotension, unspecified      Stable now, off lisinopril   ??? Other and unspecified hyperlipidemia      On zocor, followed by pmd       Past Surgical History:  Past Surgical History   Procedure Date   ??? Hx tonsil and adenoidectomy    ??? Hx tubal ligation    ??? Hx total abdominal hysterectomy 1996   ??? Hx cholecystectomy    ??? Hx pacemaker      dual chamber icd       Family History:  Family History   Problem Relation Age of Onset   ??? Diabetes Mother    ??? Heart Attack Father  MI   ??? Heart Disease Maternal Grandmother    ??? Heart Disease Paternal Grandmother    ??? Kidney Disease Other      1 sib deceased   ??? Cancer Other      1 sib throat cancer   ??? Diabetes Other    ??? Diabetes Daughter    ??? Other Daughter      leukemia   ??? Hypertension Other        Social History:  History     Social History   ??? Marital Status: MARRIED     Spouse Name: N/A     Number of Children: N/A   ??? 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 Smoking status: Never Smoker    ??? Smokeless tobacco: Never Used   ??? Alcohol Use: No   ??? Drug Use: No   ??? Sexually Active: Not on file     Other Topics Concern   ??? Not on file     Social History Narrative   ??? No narrative on file       Home Medications:  Prior to Admission medications    Medication Sig Start Date End Date Taking? Authorizing Provider   traMADol (ULTRAM) 50 mg tablet Take 1 Tab by mouth every six (6) hours as needed for Pain. 09/29/11   Marvia Pickles, MD   fluticasone (FLONASE) 50 mcg/actuation nasal spray 2 Sprays by Both Nostrils route daily. 09/29/11   Marvia Pickles, MD    rosuvastatin (CRESTOR) 5 mg tablet Take 1 Tab by mouth nightly. 09/09/11   Marvia Pickles, MD   montelukast (SINGULAIR) 10 mg tablet Take 1 Tab by mouth nightly. 09/09/11   Marvia Pickles, MD   ibuprofen (MOTRIN) 800 mg tablet Take 1 Tab by mouth every eight (8) hours as needed for Pain. 08/08/11   Marvia Pickles, MD   carvedilol (COREG) 6.25 mg tablet TAKE ONE TABLET BY MOUTH TWICE DAILY 07/28/11   Angeline Slim, MD   aspirin (ASPIRIN) 325 mg tablet Take 325 mg by mouth every four (4) hours as needed.      Historical Provider       Allergies:  Allergies   Allergen Reactions   ??? Pcn (Penicillins) Itching     Itching bumps on hands and arms       Review of Systems  12 systems reviewed - all negative except for what is noted in HPI.  Review of systems not obtained due to patient factors.  remainder of ten point review of systems reviewed with patient and negative  Athough the patient is awake, she is sedated and very tired after today's events    Physical Exam:     Visit Vitals   Item Reading   ??? BP 98/63   ??? Pulse 91   ??? Temp 98.6 ??F (37 ??C)   ??? Resp 18   ??? SpO2 96%       Physical Exam:  General appearance: Alert, cooperative, no distress, appears stated age  Head: Normocephalic, without obvious abnormality, atraumatic  Neck: Supple, no lymphadenopathy or thyromegaly, trachea midline  Lungs: Clear to auscultation bilaterally  Heart: Regular rate and rhythm, S1, S2 normal, no murmur, click, rub or gallop  Abdomen: Soft, non-tender and not-distended. Bowel sounds normal. No masses,  no organomegaly  Extremities: Extremities normal, atraumatic, no cyanosis or edema  Skin: Skin color, texture, turgor normal. No rashes or lesions  Neurologic: Grossly normal and non focal, normal muscle tone and power, cranial nerves are intact, normal sensation    Intake and Output:  Current Shift:     Last three shifts:       Lab/Data Reviewed:  CMP:   No results found for this basename: na, k, cl, co2, AGAP, glu, bun, crea, GFRAA, GFRNA, ca, mg,  phos, alb, tbil, TP, ALB, GLOB, AGRAT, sgot, ALT, GPT     CBC:   No results found for this basename: wbc, hgb, hct, plt     All Cardiac Markers in the last 24 hours:   No results found for this basename: CPK, CK, CPKMB, CKRMB, CKMMB, CKMB, RCK3, CKMBT, CKMBPC, CKSMB, CKNDX, CKND1, MYO, TROQR, TROPT, TROIQ, TROIP, TROI, TROPIT, TROPT, TRPOIT, ITNL, TNIPOC, BNP, BNPP, PBNP     Recent Glucose Results:   No results found for this basename: glpoc, glu, glucosepoc, gulpoc, glufpoct     ABG:   No results found for this basename: ph, phi, pco2, pco2i, po2, po2i, hco3, hco3i, fio2, fio2i     COAGS:   No results found for this basename: PTTP, APTT, PTP, INR, INRT     Liver Panel:   No results found for this basename: ALB, cbil, tbil, TP, GLOB, AGRAT, sgot, astpoc, altpoc, ALT, gpt, AP     Pancreatic Markers:   No results found for this basename: amylpoct, aml, lippoct, lpse, amyp, hlpse       Assessment/Plan   Principal Problem:   *Brain mass  Patient has been reviewed by neurosurgery.  Will continue steroids as per neurosurgery.  Will continue to be monitored in neuro ICU.  Further treatment as per neurosurgery.  Active Problems:     CHF (congestive heart failure) with AICD and BiV pacer in situ  Patient states that she has an EF of 20% and used to get short of breath but this has improved since BiV and AICD placement.  Currently well compensated. Will continue all regular medications.     Hypothyroid hx goiter (06/23/2008)  Will continue regular medications.     HTN (hypertension) (06/23/2008)  Will continue all regular medications, monitor ans adjust as needed.     OSA (obstructive sleep apnea) (11/13/2008)  Did previously use CPAP but has not used for a long time as she felt congested in them mornings.          Time Spent:55 min    Jamse Arn, MD  November 04, 2011      Hospital Medicine  [facility]  Pager: [phone]

## 2011-11-04 NOTE — ED Notes (Signed)
Dilantin IV completed.

## 2011-11-04 NOTE — ED Notes (Signed)
Abg's done by respiratory therapist at the bedside.

## 2011-11-04 NOTE — ED Provider Notes (Signed)
HPI Comments: Cathy [name] is a 61 y.o. Female with PMH including CHF, hypothyroidism, fibroids, HTN, left breast mass, OSA, and CAD who was transferred to this facility from [facility] with a brain tumor. On ED arrival, Medics reported that they noted a change in the patient's mental status and a drop in her BP with a systolic of 80. The patient's condition was discussed to the ER physician, Llana Aliment, DO by Dr Alphonzo Severance, Neurology and by the ER physician at [facility], Dr Westley Foots. Dr Alphonzo Severance advised administering Dilantin and Decadron and to order a MRI with and without contrast. The patient is to be admitted to the hospitalists. Family are not present and no other hx is available at this time.          Past Medical History   Diagnosis Date   ??? CHF (congestive heart failure) 06/23/2008   ??? 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 Fibroid 06/23/2008   ??? HTN (hypertension) 06/23/2008   ??? History of elevated lipids 06/23/2008   ??? Breast mass, left      benign   ??? OSA (obstructive sleep apnea) 9/10   ??? CAD (coronary artery disease)    ??? Chronic systolic heart failure      Stable, no significant fluid overload C/o sob on exertion, nyha class 2-3, feels better Unable to take lisinopril due to low bp Will continue coreg   ??? Other primary cardiomyopathies      Non ischemic, pt was taken off coreg by pmd, possibly related to side effect and low bp Continue restart of coreg as pt has ef of 20% i have discussed risk and benefit of icd implant with pt, she is agreeable now Pt off lisinopril due to lop bp, she does not want to try it again Refer to dr seutter for icd    ??? Automatic implantable cardiac defibrillator in situ      Post ddd icd Set up carelink   ??? Hypotension, unspecified      Stable now, off lisinopril   ??? Other and unspecified hyperlipidemia      On zocor, followed by pmd        Past Surgical History   Procedure Date   ??? Hx tonsil and adenoidectomy     ??? Hx tubal ligation    ??? Hx total abdominal hysterectomy 1996   ??? Hx cholecystectomy    ??? Hx pacemaker      dual chamber icd         Family History   Problem Relation Age of Onset   ??? Diabetes Mother    ??? Heart Attack Father      MI   ??? Heart Disease Maternal Grandmother    ??? Heart Disease Paternal Grandmother    ??? Kidney Disease Other      1 sib deceased   ??? Cancer Other      1 sib throat cancer   ??? Diabetes Other    ??? Diabetes Daughter    ??? Other Daughter      leukemia   ??? Hypertension Other         History     Social History   ??? Marital Status: MARRIED     Spouse Name: N/A     Number of Children: N/A   ??? 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 Smoking status: Never Smoker    ??? Smokeless tobacco: Never Used   ??? Alcohol Use: No   ??? Drug Use: No   ??? Sexually Active: Not on file     Other Topics Concern   ??? Not on file     Social History Narrative   ??? No narrative on file                  ALLERGIES: Pcn      Review of Systems   Unable to perform ROS: Mental status change       Filed Vitals:    11/04/11 1209 11/04/11 1212 11/04/11 1215   BP:   98/63   Pulse:  101 91   Temp:  98.6 ??F (37 ??C)    Resp:  20 18   SpO2: 96% 97% 96%            Physical Exam   Nursing note and vitals reviewed.  Constitutional: She is oriented to person, place, and time. She appears well-developed and well-nourished. No distress.        61 year old African American female in moderate distress.  Sleepy but arousable.     HENT:   Head: Normocephalic and atraumatic.   Right Ear: External ear normal.   Left Ear: External ear normal.   Nose: Nose normal.   Mouth/Throat: Oropharynx is clear and moist. No oropharyngeal exudate.   Eyes: Conjunctivae and EOM are normal. Pupils are equal, round, and reactive to light. Right eye exhibits no discharge. Left eye exhibits no discharge. No scleral icterus.   Neck: Normal range of motion. Neck supple. No JVD present. No tracheal deviation  present. No thyromegaly present.   Cardiovascular: Normal rate, regular rhythm, normal heart sounds and intact distal pulses.  Exam reveals no gallop and no friction rub.    No murmur heard.  Pulmonary/Chest: Effort normal and breath sounds normal. No stridor. No respiratory distress. She has no wheezes. She has no rales. She exhibits no tenderness.   Abdominal: Soft. Bowel sounds are normal. She exhibits no distension and no mass. There is no tenderness. There is no rebound and no guarding.   Musculoskeletal: Normal range of motion. She exhibits no edema and no tenderness.   Lymphadenopathy:     She has no cervical adenopathy.   Neurological: She is alert and oriented to person, place, and time. She has normal reflexes. No cranial nerve deficit.        No focal deficits.   Skin: Skin is warm and dry. No rash noted. She is not diaphoretic. No erythema. No pallor.   Psychiatric: She has a normal mood and affect. Her behavior is normal. Judgment normal.        MDM     Amount and/or Complexity of Data Reviewed:   Clinical lab tests:  Ordered and reviewed  Tests in the radiology section of CPT??:  Ordered and reviewed  Tests in the medicine section of the CPT??:  Ordered and reviewed  Discussion of test results with the performing providers:  No   Decide to obtain previous medical records or to obtain history from someone other than the patient:  Yes   Obtain history from someone other than the patient:  Yes   Review and summarize past medical records:  Yes   Discuss the patient with another provider:  No   Independant visualization of image, tracing, or specimen:  No  Risk of  Significant Complications, Morbidity, and/or Mortality:   Presenting problems:  High  Diagnostic procedures:  High  Management options:  High  Critical Care:   Total time providing critical care:  30-74 minutes (Excluding all other billable procedures.)  Progress:   Patient progress:  Stable      Procedures    PROGRESS NOTES  12:04 PM: Patient  arrives to the ED via EMS, ER physician, Llana Aliment, DO, is at the bedside. Family is not present at this time. Patient is a transfer from [facility].   1:10 PM: The patient remains the same. The patient had a chest XR done in the ED and it was discovered that she has a pacemaker. The MRI order was discontinued and a call was sent to Dr Alphonzo Severance to inform him.               CONSULTATIONS  1:13 PM: Dr Ivy Lynn office was contacted and a message was left with his Secretary regarding the pacemaker finding.   2:39 PM: Dr Ivy Lynn secretary called the ED back and stated that Dr Alphonzo Severance left the office for an emergency at [facility]. He was informed of the finding and advised the ER physician to order a CT of the head.   Dicussed with Dr. Gaylan Gerold who agreed to admit patient to her service.      EKG INTERPRETATIONS            X-RAY INTERPRETATIONS            LABORATORY RESULTS  Recent Results (from the past 12 hour(s))   EKG, 12 LEAD, INITIAL    Collection Time    11/04/11  8:01 AM       Component Value Range    Ventricular Rate 88      Atrial Rate 88      P-R Interval 180      QRS Duration 102      Q-T Interval 386      QTC Calculation (Bezet) 467      Calculated P Axis 57      Calculated R Axis -8      Calculated T Axis 156      Diagnosis        Value: Normal sinus rhythm      Left ventricular hypertrophy with repolarization abnormality      Abnormal ECG      When compared with ECG of 15-Dec-2010 13:11,      No significant change was found   GLUCOSE, POC    Collection Time    11/04/11  8:13 AM       Component Value Range    Glucose - (glucometer POC) 144 (*) 70 - 110 mg/dL   CBC WITH AUTOMATED DIFF    Collection Time    11/04/11  8:15 AM       Component Value Range    WBC 4.4 (*) 4.6 - 13.2 K/uL    RBC 4.12 (*) 4.20 - 5.30 M/uL    HGB 12.2  12.0 - 16.0 g/dL    HCT 16.1  09.6 - 04.5 %    MCV 93.9  74.0 - 97.0 FL    MCH 29.6  24.0 - 34.0 PG    MCHC 31.5  31.0 - 37.0 g/dL    RDW 40.9   81.1 - 91.4 %    PLATELET 214  135 - 420 K/uL    MPV 11.4  9.2 - 11.8 FL    NEUTROPHILS 60  40 -  73 %    LYMPHOCYTES 30  21 - 52 %    MONOCYTES 8  3 - 10 %    EOSINOPHILS 1  0 - 5 %    BASOPHILS 1  0 - 2 %    ABS. NEUTROPHILS 2.7  1.8 - 8.0 K/UL    ABS. LYMPHOCYTES 1.3  0.9 - 3.6 K/UL    ABS. MONOCYTES 0.4  0.05 - 1.2 K/UL    ABS. EOSINOPHILS 0.0  0.0 - 0.4 K/UL    ABS. BASOPHILS 0.0  0.0 - 0.06 K/UL    DF AUTOMATED     METABOLIC PANEL, BASIC    Collection Time    11/04/11  8:15 AM       Component Value Range    Sodium 141  136 - 145 MMOL/L    Potassium 4.2  3.5 - 5.5 MMOL/L    Chloride 105  100 - 108 MMOL/L    CO2 25  21 - 32 MMOL/L    Anion gap 11  3.0 - 18 mmol/L    Glucose 132 (*) 74 - 99 MG/DL    BUN 13  7.0 - 18 MG/DL    Creatinine 1.61  0.6 - 1.3 MG/DL    BUN/Creatinine ratio 13  12 - 20      GFR est AA >60  >60 ml/min/1.38m2    GFR est non-AA 60 (*) >60 ml/min/1.75m2    Calcium 8.4 (*) 8.5 - 10.1 MG/DL   CARDIAC PANEL,(CK, CKMB & TROPONIN)    Collection Time    11/04/11  8:15 AM       Component Value Range    CK 41  26 - 192 U/L    CK - MB <0.5 (*) 0.5 - 3.6 ng/ml    CK-MB Index CANNOT BE CALCULATED  0.0 - 4.0 %    Troponin-I, Qt. <0.02  0.00 - 0.06 NG/ML   ETHYL ALCOHOL    Collection Time    11/04/11  8:15 AM       Component Value Range    ALCOHOL(ETHYL),SERUM <3  0 - 3 MG/DL   URINALYSIS W/ RFLX MICROSCOPIC    Collection Time    11/04/11  8:15 AM       Component Value Range    Color YELLOW      Appearance CLEAR      Specific gravity >1.030 (*) 1.003 - 1.030      pH 5.5  5.0 - 8.0      Protein 30 (*) NEGATIVE MG/DL    Glucose NEGATIVE   NEGATIVE MG/DL    Ketone NEGATIVE   NEGATIVE MG/DL    Bilirubin NEGATIVE   NEGATIVE    Blood MODERATE (*) NEGATIVE    Urobilinogen 0.2  0.2 - 1.0 EU/DL    Nitrites NEGATIVE   NEGATIVE    Leukocyte Esterase NEGATIVE   NEGATIVE   DRUG SCREEN, URINE ([facility] ONLY)    Collection Time    11/04/11  8:15 AM       Component Value Range    PCP(PHENCYCLIDINE) NEGATIVE   NEGATIVE    THC  (TH-CANNABINOL) NEGATIVE   NEGATIVE    AMPHETAMINE NEGATIVE   NEGATIVE    BARBITURATES NEGATIVE   NEGATIVE    BENZODIAZEPINE NEGATIVE   NEGATIVE    COCAINE NEGATIVE   NEGATIVE    OPIATES NEGATIVE   NEGATIVE    TRICYCLICS NEGATIVE   NEGATIVE    HDSCOM        Value:  Specimen analysis was performed without chain of custody handling.  These results should be used for medical purposes only and not for legal or employment purposes.  Unconfirmed screening results must not be used for non-medical purposes.   URINE MICROSCOPIC ONLY    Collection Time    11/04/11  8:15 AM       Component Value Range    WBC 0 to 3  0 - 4 /HPF    RBC 4 to 10  0 - 5 /HPF    Epithelial cells FEW  0 - 5 /LPF    Bacteria NEGATIVE   NEGATIVE /HPF   AMMONIA    Collection Time    11/04/11  8:30 AM       Component Value Range    Ammonia 47 (*) 11 - 32 UMOL/L     Diagnosis: Brain mass  With midline shift    Disposition: Admitted    Follow-up Information     Follow up With Details Comments Contact Info    Jamse Arn, MD   [facility]  [address] [place] [address]  [phone]            Patient's Medications   Start Taking    No medications on file   Continue Taking    ASPIRIN (ASPIRIN) 325 MG TABLET    Take 325 mg by mouth every four (4) hours as needed.      CARVEDILOL (COREG) 6.25 MG TABLET    TAKE ONE TABLET BY MOUTH TWICE DAILY    FLUTICASONE (FLONASE) 50 MCG/ACTUATION NASAL SPRAY    2 Sprays by Both Nostrils route daily.    IBUPROFEN (MOTRIN) 800 MG TABLET    Take 1 Tab by mouth every eight (8) hours as needed for Pain.    MONTELUKAST (SINGULAIR) 10 MG TABLET    Take 1 Tab by mouth nightly.    ROSUVASTATIN (CRESTOR) 5 MG TABLET    Take 1 Tab by mouth nightly.    TRAMADOL (ULTRAM) 50 MG TABLET    Take 1 Tab by mouth every six (6) hours as needed for Pain.   These Medications have changed    No medications on file   Stop Taking    No medications on file             SCRIBE ATTESTATION STATEMENT  Provider  documentation is written by Dulce Sellar. [employer]  , acting as Neurosurgeon for [employer], DO.  I, Llana Aliment, DO, have reviewed the information recorded by the Scribe and agree with its contents.

## 2011-11-04 NOTE — Progress Notes (Signed)
1740 Received report from ED nurse, Marlowe Aschoff, assumed care. Pt settled in room, see assessment.    Bedside and Verbal shift change report given to Enos Fling, RN (oncoming nurse) by Gala Lewandowsky, RN (offgoing nurse).  Report given with SBAR, Kardex, Procedure Summary, Intake/Output, MAR and Recent Results.

## 2011-11-04 NOTE — Progress Notes (Signed)
Chaplain conducted an initial consultation and Spiritual Assessment for Cathy [name], who is a 61 y.o.,female. Patient???s Primary Language is: [place].   According to the patient???s EMR Religious Affiliation is: Other.     The reason the Patient came to the hospital is:   Patient Active Problem List    Diagnosis Date Noted   ??? Brain mass 11/04/2011   ??? AR (allergic rhinitis) 09/29/2011   ??? Chronic systolic heart failure    ??? Other primary cardiomyopathies    ??? Automatic implantable cardiac defibrillator in situ    ??? Hypotension, unspecified    ??? Other and unspecified hyperlipidemia    ??? AICD (automatic cardioverter/defibrillator) present 11/15/2010   ??? Cardiomyopathy 10/17/2010   ??? Cataract 12/01/2008   ??? Gallstones 11/13/2008   ??? Insomnia 11/13/2008   ??? OSA (obstructive sleep apnea) 11/13/2008   ??? CHF (congestive heart failure) 06/23/2008   ??? 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   ??? Fibroid 06/23/2008   ??? HTN (hypertension) 06/23/2008   ??? History of elevated lipids 06/23/2008        The Chaplain provided the following Interventions:  Initiated a relationship of care and support.   Explored issues of faith, belief, spirituality and religious/ritual needs while hospitalized.  Listened empathically.  Provided chaplaincy education.  Provided information about Spiritual Care Services.  Offered prayer and assurance of continued prayers on patients behalf.   Chart reviewed.    The following outcomes were achieved:  Patient shared limited information about both their medical narrative and spiritual journey/beliefs.  Patient processed feeling about current hospitalization.  Patient expressed gratitude for pastoral care visit.    Assessment:  Patient does not have any religious/cultural needs that will affect patient???s preferences in health care.  There are no further spiritual or religious issues which require Spiritual Care Services interventions at this time.       Plan:  Chaplains will continue to follow and will  provide pastoral care on an as needed/requested basis.  Chaplain recommends bedside caregivers page chaplain on duty if patient shows signs of acute spiritual or emotional distress.      Chaplain Briscoe Deutscher, MDiv,   Board Certified Chaplain  [phone] - Office

## 2011-11-04 NOTE — Progress Notes (Addendum)
1915 Received bedside report from Gala Lewandowsky RN on pt.    2100 Assessment completed on pt. Admission documentation completed. Pt husband at bedside.    2200 Pt to be NPO after midnight per orders. Family informed. Pt not on surgery schedule at this time.    0200 Pt called nurse to room. Pt accidentally pulled IV out of hand. Pt stuck 4 times before a nurse was able to put a 24 gauge in pt left hand. Pt states she is a very hard stick and that it took [place] 8 times to put a 24 in her hand.    0400 Pt sleeping. No S/S pain or discomfort. Pt BP dropped at the 0300 and 0400 BP checks.    0600 No change.    0715 Bedside and Verbal shift change report given to Loma Sousa and Hotel manager by Kirtland Bouchard. Alfred Levins Writer). Report given with SBAR, Kardex, Intake/Output, Accordion and Recent Results.

## 2011-11-04 NOTE — ED Notes (Signed)
Pt remains in stable condition. Denies pain or other complaints. Pt's family remains uncertain about admission to [facility].

## 2011-11-04 NOTE — ED Notes (Signed)
Called patient's home to get information for MRI screen and no answer at this time. MRI tech was called and informed unable to answer screen because  no family member at this time and patient altered mental status.

## 2011-11-04 NOTE — ED Notes (Addendum)
Pt denies leg pain at this time. Husband at bedside. Pt A & O x 4. Pt in NOAD. No nero deficits noted.

## 2011-11-04 NOTE — Other (Signed)
TRANSFER - OUT REPORT:    Verbal report given to [employer]) on Cathy [name]  being transferred to 2605(unit) for routine progression of care       Report consisted of patient???s Situation, Background, Assessment and   Recommendations(SBAR).     Information from the following report(s) ED Summary was reviewed with the receiving nurse.    Opportunity for questions and clarification was provided.

## 2011-11-04 NOTE — ED Notes (Signed)
Pt transported with MTI in stable condition. Report given To Will Shackelford, paramedic.

## 2011-11-04 NOTE — ED Notes (Signed)
Unresponsive per EMS

## 2011-11-04 NOTE — ED Notes (Signed)
Portable x-ray at the bedside.

## 2011-11-04 NOTE — ED Notes (Signed)
When EMTALA was explained to pt and husband and request for signature was made, husband became upset and states that he wants his wife to go to [facility] because "too many people die at [place] and [place]". Physician was notified and is talking with husband at nurses station.

## 2011-11-05 LAB — CBC WITH AUTOMATED DIFF
ABS. BASOPHILS: 0 10*3/uL (ref 0.0–0.06)
ABS. BASOPHILS: 0 10*3/uL (ref 0.0–0.06)
ABS. EOSINOPHILS: 0 10*3/uL (ref 0.0–0.4)
ABS. EOSINOPHILS: 0 10*3/uL (ref 0.0–0.4)
ABS. LYMPHOCYTES: 0.5 10*3/uL — ABNORMAL LOW (ref 0.9–3.6)
ABS. LYMPHOCYTES: 0.6 10*3/uL — ABNORMAL LOW (ref 0.9–3.6)
ABS. MONOCYTES: 0.1 10*3/uL (ref 0.05–1.2)
ABS. MONOCYTES: 0.5 10*3/uL (ref 0.05–1.2)
ABS. NEUTROPHILS: 5.5 10*3/uL (ref 1.8–8.0)
ABS. NEUTROPHILS: 7.2 10*3/uL (ref 1.8–8.0)
BASOPHILS: 0 % (ref 0–2)
BASOPHILS: 0 % (ref 0–2)
EOSINOPHILS: 0 % (ref 0–5)
EOSINOPHILS: 0 % (ref 0–5)
HCT: 37.3 % (ref 35.0–45.0)
HCT: 36.5 % (ref 35.0–45.0)
HGB: 12.3 g/dL (ref 12.0–16.0)
HGB: 11.9 g/dL — ABNORMAL LOW (ref 12.0–16.0)
LYMPHOCYTES: 9 % — ABNORMAL LOW (ref 21–52)
LYMPHOCYTES: 8 % — ABNORMAL LOW (ref 21–52)
MCH: 30.1 PG (ref 24.0–34.0)
MCH: 29.6 PG (ref 24.0–34.0)
MCHC: 33 g/dL (ref 31.0–37.0)
MCHC: 32.6 g/dL (ref 31.0–37.0)
MCV: 91.2 FL (ref 74.0–97.0)
MCV: 90.8 FL (ref 74.0–97.0)
MONOCYTES: 2 % — ABNORMAL LOW (ref 3–10)
MONOCYTES: 6 % (ref 3–10)
MPV: 11.5 FL (ref 9.2–11.8)
MPV: 11.4 FL (ref 9.2–11.8)
NEUTROPHILS: 89 % — ABNORMAL HIGH (ref 40–73)
NEUTROPHILS: 86 % — ABNORMAL HIGH (ref 40–73)
PLATELET: 206 10*3/uL (ref 135–420)
PLATELET: 225 10*3/uL (ref 135–420)
RBC: 4.09 M/uL — ABNORMAL LOW (ref 4.20–5.30)
RBC: 4.02 M/uL — ABNORMAL LOW (ref 4.20–5.30)
RDW: 13 % (ref 11.6–14.5)
RDW: 13 % (ref 11.6–14.5)
WBC: 6.1 10*3/uL (ref 4.6–13.2)
WBC: 8.3 10*3/uL (ref 4.6–13.2)

## 2011-11-05 LAB — METABOLIC PANEL, COMPREHENSIVE
A-G Ratio: 1 (ref 0.8–1.7)
A-G Ratio: 1.1 (ref 0.8–1.7)
ALT (SGPT): 26 U/L (ref 12.0–78.0)
ALT (SGPT): 24 U/L (ref 12.0–78.0)
AST (SGOT): 22 U/L (ref 15–37)
AST (SGOT): 14 U/L — ABNORMAL LOW (ref 15–37)
Albumin: 3.4 g/dL (ref 3.4–5.0)
Albumin: 3.4 g/dL (ref 3.4–5.0)
Alk. phosphatase: 105 U/L (ref 50–136)
Alk. phosphatase: 104 U/L (ref 50–136)
Anion gap: 11 mmol/L (ref 3.0–18)
Anion gap: 9 mmol/L (ref 3.0–18)
BUN/Creatinine ratio: 19 (ref 12–20)
BUN/Creatinine ratio: 17 (ref 12–20)
BUN: 11 MG/DL (ref 7.0–18)
BUN: 12 MG/DL (ref 7.0–18)
Bilirubin, total: 0.3 MG/DL (ref 0.2–1.0)
Bilirubin, total: 0.5 MG/DL (ref 0.2–1.0)
CO2: 25 MMOL/L (ref 21–32)
CO2: 27 MMOL/L (ref 21–32)
Calcium: 8.4 MG/DL — ABNORMAL LOW (ref 8.5–10.1)
Calcium: 9 MG/DL (ref 8.5–10.1)
Chloride: 105 MMOL/L (ref 100–108)
Chloride: 103 MMOL/L (ref 100–108)
Creatinine: 0.59 MG/DL — ABNORMAL LOW (ref 0.6–1.3)
Creatinine: 0.71 MG/DL (ref 0.6–1.3)
GFR est AA: >60 mL/min/{1.73_m2} (ref >60)
GFR est AA: >60 mL/min/{1.73_m2} (ref >60)
GFR est non-AA: >60 mL/min/{1.73_m2} (ref >60)
GFR est non-AA: >60 mL/min/{1.73_m2} (ref >60)
Globulin: 3.3 g/dL (ref 2.0–4.0)
Globulin: 3 g/dL (ref 2.0–4.0)
Glucose: 172 MG/DL — ABNORMAL HIGH (ref 74–99)
Glucose: 124 MG/DL — ABNORMAL HIGH (ref 74–99)
Potassium: 4.6 MMOL/L (ref 3.5–5.5)
Potassium: 4.2 MMOL/L (ref 3.5–5.5)
Protein, total: 6.7 g/dL (ref 6.4–8.2)
Protein, total: 6.4 g/dL (ref 6.4–8.2)
Sodium: 141 MMOL/L (ref 136–145)
Sodium: 139 MMOL/L (ref 136–145)

## 2011-11-05 LAB — BLOOD GAS, ARTERIAL POC
Base excess (POC): 0 mmol/L
FIO2 (POC): 48 %
HCO3 (POC): 26.3 MMOL/L — ABNORMAL HIGH (ref 22–26)
Nasal cannula flow rate: 6 L/min
pCO2 (POC): 51.9 MMHG — ABNORMAL HIGH (ref 35.0–45.0)
pH (POC): 7.312 — ABNORMAL LOW (ref 7.35–7.45)
pO2 (POC): 78 MMHG — ABNORMAL LOW (ref 80–100)
sO2 (POC): 94 % (ref 92–97)

## 2011-11-05 LAB — EKG, 12 LEAD, INITIAL
Atrial Rate: 88 {beats}/min
Calculated P Axis: 57 degrees
Calculated R Axis: -8 degrees
Calculated T Axis: 156 degrees
Diagnosis: NORMAL
P-R Interval: 180 ms
Q-T Interval: 386 ms
QRS Duration: 102 ms
QTC Calculation (Bezet): 467 ms
Ventricular Rate: 88 {beats}/min

## 2011-11-05 LAB — NT-PRO BNP: NT pro-BNP: 2707 PG/ML — ABNORMAL HIGH (ref 0–900)

## 2011-11-05 LAB — MAGNESIUM
Magnesium: 1.8 MG/DL (ref 1.8–2.4)
Magnesium: 1.6 MG/DL — ABNORMAL LOW (ref 1.8–2.4)

## 2011-11-05 MED ADMIN — phenytoin (DILANTIN) injection 100 mg: INTRAVENOUS | @ 22:00:00 | NDC 00641049321

## 2011-11-05 MED ADMIN — dexamethasone (DECADRON) 10 mg in 0.9% sodium chloride 50 mL IVPB: INTRAVENOUS | @ 18:00:00 | NDC 00641036721

## 2011-11-05 MED ADMIN — lidocaine (PF) (XYLOCAINE) 10 mg/mL (1 %) injection Soln 5 mL: SUBCUTANEOUS | @ 14:00:00 | NDC 00409471365

## 2011-11-05 MED ADMIN — sodium chloride (NS) flush 5-10 mL: INTRAVENOUS | @ 18:00:00 | NDC 87701099893

## 2011-11-05 MED ADMIN — sodium chloride (NS) flush 5-10 mL: INTRAVENOUS | @ 15:00:00 | NDC 87701099893

## 2011-11-05 MED ADMIN — furosemide (LASIX) injection 40 mg: INTRAVENOUS | @ 20:00:00 | NDC 00409610204

## 2011-11-05 MED ADMIN — ondansetron (ZOFRAN ODT) tablet 4 mg: ORAL | @ 16:00:00 | NDC 68462015740

## 2011-11-05 MED ADMIN — rosuvastatin (CRESTOR) tablet 5 mg: ORAL | @ 02:00:00 | NDC 00310075139

## 2011-11-05 MED ADMIN — carvedilol (COREG) tablet 6.25 mg: ORAL | @ 22:00:00 | NDC 68084026111

## 2011-11-05 MED ADMIN — sodium chloride (NS) flush 5-10 mL: INTRAVENOUS | @ 02:00:00 | NDC 87701099893

## 2011-11-05 MED ADMIN — phenytoin (DILANTIN) 1,000 mg in 0.9% sodium chloride 250 mL IVPB: INTRAVENOUS | @ 17:00:00 | NDC 00641255541

## 2011-11-05 MED ADMIN — fluticasone (FLONASE) 50 mcg/actuation nasal spray 2 Spray: NASAL | @ 15:00:00 | NDC 50383070016

## 2011-11-05 MED ADMIN — perflutren lipid microspheres (DEFINITY) in NS bolus IV: INTRAVENOUS | @ 18:00:00 | NDC 11994001101

## 2011-11-05 MED ADMIN — carvedilol (COREG) tablet 6.25 mg: ORAL | @ 15:00:00 | NDC 68084026111

## 2011-11-05 MED ADMIN — dexamethasone (DECADRON) 10 mg in 0.9% sodium chloride 50 mL IVPB: INTRAVENOUS | @ 22:00:00 | NDC 00641036721

## 2011-11-05 MED ADMIN — montelukast (SINGULAIR) tablet 10 mg: ORAL | @ 02:00:00 | NDC 68084062011

## 2011-11-05 MED ADMIN — traMADol (ULTRAM) tablet 50 mg: ORAL | @ 15:00:00 | NDC 62584055911

## 2011-11-05 MED FILL — DEXAMETHASONE SODIUM PHOSPHATE 10 MG/ML IJ SOLN: 10 mg/mL | INTRAMUSCULAR | Qty: 1

## 2011-11-05 MED FILL — PHENYTOIN SODIUM 50 MG/ML IV: 50 mg/mL | INTRAVENOUS | Qty: 20

## 2011-11-05 MED FILL — SINGULAIR 10 MG TABLET: 10 mg | ORAL | Qty: 1

## 2011-11-05 MED FILL — CARVEDILOL 3.125 MG TAB: 3.125 mg | ORAL | Qty: 2

## 2011-11-05 MED FILL — PERFLUTREN LIPID MICROSPHERES 1.1 MG/ML IV: 1.1 mg/mL | INTRAVENOUS | Qty: 1.3

## 2011-11-05 MED FILL — ONDANSETRON 4 MG TAB, RAPID DISSOLVE: 4 mg | ORAL | Qty: 1

## 2011-11-05 MED FILL — CRESTOR 10 MG TABLET: 10 mg | ORAL | Qty: 1

## 2011-11-05 MED FILL — PHENYTOIN SODIUM 50 MG/ML IV: 50 mg/mL | INTRAVENOUS | Qty: 2

## 2011-11-05 MED FILL — TRAMADOL 50 MG TAB: 50 mg | ORAL | Qty: 1

## 2011-11-05 MED FILL — FUROSEMIDE 10 MG/ML IJ SOLN: 10 mg/mL | INTRAMUSCULAR | Qty: 4

## 2011-11-05 NOTE — Progress Notes (Signed)
[facility] - Consult Note    Date of  Admission: 11/04/2011 12:06 PM   Primary Care Physician:  Marvia Pickles, MD     Assessment:     Patient Active Problem List   Diagnosis Code   ??? CHF (congestive heart failure) 428.0   ??? 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 Fibroid 218.9   ??? HTN (hypertension) 401.9   ??? History of elevated lipids V12.29   ??? Gallstones 574.20   ??? Insomnia 780.52   ??? OSA (obstructive sleep apnea) 327.23   ??? Cataract 366.9   ??? Cardiomyopathy 425.4   ??? AICD (automatic cardioverter/defibrillator) present V45.02   ??? Chronic systolic heart failure 428.22   ??? 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 Automatic implantable cardiac defibrillator in situ V45.02   ??? Hypotension, unspecified 458.9   ??? Other and unspecified hyperlipidemia 272.4   ??? AR (allergic rhinitis) 477.9   ??? 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  Coronary angiogram in 2008. Normal coronaries  Mild fluid overload on exam  Patient denies any ICD shock however in view of syncopal episodes, will interrogate ICD    - Will give Lasix 40 mg IV once today  - Echo done today. Will review  - Continue coreg  - Will have ICD interrogated.   - Historically not on ACE-i because of hypotension.       History of Present Illness:     This is a 61 y.o. female admitted for Brain mass;Brain mass.      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   Known patient of Dr.A.Allena Katz, She has CHF, hypothyroidism, fibroids, HTN, left breast mass, OSA, who was transferred  to this facility from [facility] with a new brain tumor.     Patient does not recall entire event. However tells me that she passed out yesterday and found herself in ED.  According to ED note : As per her husband the patient "woke up" early this morning at about 07:00 and sat up in bed - her husband felt that she was trying to say something but could not speak and then she began "posturing and shaking" her right arm. He thought she was having a stroke and called EMS. The patient has no memory of any of the days events. On ED arrival, Medics reported that they noted a change in the patient's mental status and a drop in her BP with a systolic of 80. The patient's condition was discussed to the ER physician, Llana Aliment, DO by Dr Alphonzo Severance, Neurology and by the ER physician at [facility], Dr Westley Foots. Dr Alphonzo Severance advised administering Dilantin and Decadron and to order a MRI with and without contrast. The patient is to be admitted to the hospitalists.     Currently patient denies any complaint.  Seen Dr.A.Jaxtyn Linville in 08/2011 and she was stable. No change in meds was made.  She  never had chest pain or palpitation or syncopal episode before.  She was thought to have seizure because of frontal brain tumor. Plan for surgery on Saturday  Currently denies any complaint  Stable DOE after walking 3 block  2 pillows at night    Review of Symptoms:  Except as stated above include:  Constitutional:  negative  Respiratory:  negative  Cardiovascular:  negative  Gastrointestinal: negative  Genitourinary:  negative  Musculoskeletal:  negative  Neurological:  negative       Past Medical History:     Past Medical History   Diagnosis Date   ??? CHF (congestive heart failure) 06/23/2008     Non-ischemic CMP , Cath (2008) Normal coronary arteries   ??? 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 Fibroid 06/23/2008   ??? HTN (hypertension) 06/23/2008   ??? HLD (hyperlipidemia)    ??? Breast mass, left       benign   ??? OSA (obstructive sleep apnea) 9/10   ??? Chronic systolic heart failure      Stable,    ??? Automatic implantable cardiac defibrillator in situ      Post ddd icd Set up carelink   ??? Hypotension, unspecified      Related to lisinopril         Social History:     History     Social History   ??? Marital Status: MARRIED     Spouse Name: N/A     Number of Children: N/A   ??? 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 Smoking status: Never Smoker    ??? Smokeless tobacco: Never Used   ??? Alcohol Use: No   ??? Drug Use: No   ??? Sexually Active: Not on file     Other Topics Concern   ??? Not on file     Social History Narrative   ??? No narrative on file        Family History:     Family History   Problem Relation Age of Onset   ??? Diabetes Mother    ??? Heart Attack Father      MI   ??? Heart Disease Maternal Grandmother    ??? Heart Disease Paternal Grandmother    ??? Kidney Disease Other      1 sib deceased   ??? Cancer Other      1 sib throat cancer   ??? Diabetes Other    ??? Diabetes Daughter    ??? Other Daughter      leukemia   ??? Hypertension Other         Medications:     Allergies   Allergen Reactions   ??? Pcn (Penicillins) Itching     Itching bumps on hands and arms        Current Facility-Administered Medications   Medication Dose Route Frequency   ??? phenytoin (DILANTIN) 1,000 mg in 0.9% sodium chloride 250 mL IVPB  1,000 mg IntraVENous NOW   ??? phenytoin (DILANTIN) injection 100 mg  100 mg IntraVENous Q8H   ??? dexamethasone (DECADRON) 10 mg in 0.9% sodium chloride 50 mL IVPB  10 mg IntraVENous Q6H   ??? lidocaine (PF) (XYLOCAINE) 10 mg/mL (1 %) injection Soln 5 mL  5 mL SubCUTAneous ONCE   ??? sodium chloride (NS) flush 10-30 mL  10-30 mL  InterCATHeter PRN   ??? bacitracin 500 unit/gram packet 1 Packet  1 Packet Topical PRN   ??? perflutren lipid microspheres (DEFINITY) in NS bolus IV  1 mL IntraVENous PRN   ??? DISCONTD: dexamethasone (DECADRON) injection 10 mg  10 mg IntraVENous  Q6H   ??? DISCONTD: phenytoin (DILANTIN) injection 100 mg  100 mg IntraVENous Q8H   ??? DISCONTD: dexamethasone (DECADRON) injection 10 mg  10 mg IntraVENous Q6H   ??? carvedilol (COREG) tablet 6.25 mg  6.25 mg Oral BID WITH MEALS   ??? fluticasone (FLONASE) 50 mcg/actuation nasal spray 2 Spray  2 Spray Both Nostrils DAILY   ??? montelukast (SINGULAIR) tablet 10 mg  10 mg Oral QHS   ??? rosuvastatin (CRESTOR) tablet 5 mg  5 mg Oral QHS   ??? traMADol (ULTRAM) tablet 50 mg  50 mg Oral Q6H PRN   ??? sodium chloride (NS) flush 5-10 mL  5-10 mL IntraVENous Q8H   ??? sodium chloride (NS) flush 5-10 mL  5-10 mL IntraVENous PRN   ??? acetaminophen (TYLENOL) tablet 650 mg  650 mg Oral Q4H PRN   ??? HYDROmorphone (PF) (DILAUDID) injection 1 mg  1 mg IntraVENous Q4H PRN   ??? naloxone (NARCAN) injection 0.4 mg  0.4 mg IntraVENous PRN   ??? ondansetron (ZOFRAN) injection 4 mg  4 mg IntraVENous Q4H PRN   ??? ondansetron (ZOFRAN ODT) tablet 4 mg  4 mg Oral Q4H PRN   ??? ioversol (OPTIRAY) 320 mg iodine/mL contrast injection 1-125 mL  1-125 mL IntraVENous RAD ONCE   ??? DISCONTD: sodium chloride (NS) flush 5-10 mL  5-10 mL IntraVENous Q8H   ??? DISCONTD: sodium chloride (NS) flush 5-10 mL  5-10 mL IntraVENous PRN      Prescriptions prior to admission   Medication Sig   ??? traMADol (ULTRAM) 50 mg tablet Take 1 Tab by mouth every six (6) hours as needed for Pain.   ??? fluticasone (FLONASE) 50 mcg/actuation nasal spray 2 Sprays by Both Nostrils route daily.   ??? rosuvastatin (CRESTOR) 5 mg tablet Take 1 Tab by mouth nightly.   ??? montelukast (SINGULAIR) 10 mg tablet Take 1 Tab by mouth nightly.   ??? ibuprofen (MOTRIN) 800 mg tablet Take 1 Tab by mouth every eight (8) hours as needed for Pain.   ??? carvedilol (COREG) 6.25 mg tablet TAKE ONE TABLET BY MOUTH TWICE DAILY   ??? aspirin (ASPIRIN) 325 mg tablet Take 325 mg by mouth every four (4) hours as needed.        Physical Exam:     Visit Vitals   Item Reading   ??? BP 105/56   ??? Pulse 77   ??? Temp 98 ??F (36.7 ??C)   ??? Resp 19    ??? Ht 5\' 10"  (1.778 m)   ??? Wt 197 lb (89.359 kg)   ??? BMI 28.27 kg/m2   ??? SpO2 100%     BP Readings from Last 3 Encounters:   11/05/11 105/56   11/04/11 113/70   09/29/11 110/70     Pulse Readings from Last 3 Encounters:   11/05/11 77   11/04/11 88   09/29/11 76     Wt Readings from Last 3 Encounters:   11/05/11 197 lb (89.359 kg)   11/04/11 197 lb (89.359 kg)   09/29/11 207 lb (93.895 kg)       General:  alert, cooperative, no distress, appears stated age  Neck:  no carotid bruit, no JVD  Lungs:  Few basilar  rales  Heart:  regular rate and rhythm, S1, S2 normal, no murmur, click, rub or gallop  Abdomen:  abdomen is soft without significant tenderness, masses, organomegaly or guarding  Extremities:  no edema  No skin rash     Data Review:     EKG;  Normal sinus rhythm  Left ventricular hypertrophy with repolarization abnormality  T wave abnormality, consider lateral ischemia ( Unchanged )  Abnormal ECG  When compared with ECG of 15-Dec-2010 13:11,  No significant change was found    Recent Labs   [facility] 11/05/11 0415 11/04/11 2140 11/04/11 0815    WBC 8.3 6.1 4.4*    HGB 11.9* 12.3 12.2    HCT 36.5 37.3 38.7    PLT 225 206 214     Recent Labs   Basename 11/05/11 0415 11/04/11 2140 11/04/11 0815    NA 139 141 141    K 4.2 4.6 4.2    CL 103 105 105    CO2 27 25 25     GLU 124* 172* 132*    BUN 12 11 13     CREA 0.71 0.59* 1.00    CA 9.0 8.4* 8.4*    MG 1.6* 1.8 --    PHOS -- -- --    ALB 3.4 3.4 --    TBIL 0.5 0.3 --    SGOT 14* 22 --    INR -- -- --       Recent Labs   Basename 11/04/11 0815    TROIQ --    CPK 41    CKMB --     Last Lipid:    Lab Results   Component Value Date/Time    Cholesterol, total 229 09/02/2011  9:07 AM    HDL Cholesterol 56 09/02/2011  9:07 AM    LDL, calculated 164 09/02/2011  9:07 AM    Triglyceride 47 09/02/2011  9:07 AM    CHOL/HDL Ratio 3.4 11/23/2008 11:55 AM     Lab Results   Component Value Date/Time    CK 41 11/04/2011  8:15 AM    CK - MB <0.5 11/04/2011  8:15 AM    CK-MB Index CANNOT BE  CALCULATED 11/04/2011  8:15 AM    Troponin-I, Qt. <0.02 11/04/2011  8:15 AM     Signed By: Tretha Sciara, MD     November 05, 2011

## 2011-11-05 NOTE — Progress Notes (Signed)
Progress Note      Patient: Cathy [name]               Sex: female          DOA: 11/04/2011         Date of Birth:  [birth year]      Age:  61 y.o.        LOS:  LOS: 1 day                  * No surgery found *      SUBJECTIVE:  Afebrile  No complaints of pain or visual disturbances  Denies any weakness  No swallowing difficulty  Poor venous access noted  C/o chronic Left lateral calf pain (x 2 years)      OBJECTIVE:  Patient Vitals for the past 4 hrs:   Temp Pulse Resp BP SpO2   11/05/11 1100 - 93  20  117/68 mmHg 100 %   11/05/11 1000 - 94  17  121/59 mmHg 100 %   11/05/11 0900 - 90  19  115/63 mmHg 99 %   11/05/11 0800 98.1 ??F (36.7 ??C) 91  18  119/56 mmHg 97 %   11/05/11 0746 - - - - 100 %         Intake/Output Summary (Last 24 hours) at 11/05/11 1132  Last data filed at 11/05/11 1100   Gross per 24 hour   Intake      0 ml   Output   1600 ml   Net  -1600 ml       Physical Exam:   AO x3 PERRLA VFFC EOMI    Slight  nystagmus to the right  CNII-XII intact  Motor and sensory exam normal except Left lateral lower leg hypersensative to touch  CV: RRR  Resp: CTA  ABD: NT/ND NABS  Extrem: + pulses no edema  Bilateral 5/5 motor strength    Recent Results (from the past 24 hour(s))   POC G3    Collection Time    11/04/11 12:39 PM       Component Value Range    Device: NASAL CANNULA      Nasal cannula flow rate 6.0      FIO2 (POC) 48      pH (POC) 7.312 (*) 7.35 - 7.45      pCO2 (POC) 51.9 (*) 35.0 - 45.0 MMHG    pO2 (POC) 78 (*) 80 - 100 MMHG    HCO3 (POC) 26.3 (*) 22 - 26 MMOL/L    sO2 (POC) 94  92 - 97 %    Base excess (POC) 0      Allens test (POC) YES      Site LEFT RADIAL      Specimen type (POC) ARTERIAL      Performed by Romana Juniper     CBC WITH AUTOMATED DIFF    Collection Time    11/04/11  9:40 PM       Component Value Range    WBC 6.1  4.6 - 13.2 K/uL    RBC 4.09 (*) 4.20 - 5.30 M/uL    HGB 12.3  12.0 - 16.0 g/dL    HCT 29.5  62.1 - 30.8 %    MCV 91.2  74.0 - 97.0 FL    MCH 30.1  24.0 - 34.0 PG    MCHC 33.0   31.0 - 37.0 g/dL    RDW 65.7  84.6 - 96.2 %  PLATELET 206  135 - 420 K/uL    MPV 11.5  9.2 - 11.8 FL    NEUTROPHILS 89 (*) 40 - 73 %    LYMPHOCYTES 9 (*) 21 - 52 %    MONOCYTES 2 (*) 3 - 10 %    EOSINOPHILS 0  0 - 5 %    BASOPHILS 0  0 - 2 %    ABS. NEUTROPHILS 5.5  1.8 - 8.0 K/UL    ABS. LYMPHOCYTES 0.5 (*) 0.9 - 3.6 K/UL    ABS. MONOCYTES 0.1  0.05 - 1.2 K/UL    ABS. EOSINOPHILS 0.0  0.0 - 0.4 K/UL    ABS. BASOPHILS 0.0  0.0 - 0.06 K/UL    DF AUTOMATED     METABOLIC PANEL, COMPREHENSIVE    Collection Time    11/04/11  9:40 PM       Component Value Range    Sodium 141  136 - 145 MMOL/L    Potassium 4.6  3.5 - 5.5 MMOL/L    Chloride 105  100 - 108 MMOL/L    CO2 25  21 - 32 MMOL/L    Anion gap 11  3.0 - 18 mmol/L    Glucose 172 (*) 74 - 99 MG/DL    BUN 11  7.0 - 18 MG/DL    Creatinine 1.61 (*) 0.6 - 1.3 MG/DL    BUN/Creatinine ratio 19  12 - 20      GFR est AA >60  >60 ml/min/1.35m2    GFR est non-AA >60  >60 ml/min/1.71m2    Calcium 8.4 (*) 8.5 - 10.1 MG/DL    Bilirubin, total 0.3  0.2 - 1.0 MG/DL    ALT 26  09.6 - 04.5 U/L    AST 22  15 - 37 U/L    Alk. phosphatase 105  50 - 136 U/L    Protein, total 6.7  6.4 - 8.2 g/dL    Albumin 3.4  3.4 - 5.0 g/dL    Globulin 3.3  2.0 - 4.0 g/dL    A-G Ratio 1.0  0.8 - 1.7     MAGNESIUM    Collection Time    11/04/11  9:40 PM       Component Value Range    Magnesium 1.8  1.8 - 2.4 MG/DL   CBC WITH AUTOMATED DIFF    Collection Time    11/05/11  4:15 AM       Component Value Range    WBC 8.3  4.6 - 13.2 K/uL    RBC 4.02 (*) 4.20 - 5.30 M/uL    HGB 11.9 (*) 12.0 - 16.0 g/dL    HCT 40.9  81.1 - 91.4 %    MCV 90.8  74.0 - 97.0 FL    MCH 29.6  24.0 - 34.0 PG    MCHC 32.6  31.0 - 37.0 g/dL    RDW 78.2  95.6 - 21.3 %    PLATELET 225  135 - 420 K/uL    MPV 11.4  9.2 - 11.8 FL    NEUTROPHILS 86 (*) 40 - 73 %    LYMPHOCYTES 8 (*) 21 - 52 %    MONOCYTES 6  3 - 10 %    EOSINOPHILS 0  0 - 5 %    BASOPHILS 0  0 - 2 %    ABS. NEUTROPHILS 7.2  1.8 - 8.0 K/UL    ABS. LYMPHOCYTES 0.6 (*) 0.9 - 3.6  K/UL  ABS. MONOCYTES 0.5  0.05 - 1.2 K/UL    ABS. EOSINOPHILS 0.0  0.0 - 0.4 K/UL    ABS. BASOPHILS 0.0  0.0 - 0.06 K/UL    DF AUTOMATED     METABOLIC PANEL, COMPREHENSIVE    Collection Time    11/05/11  4:15 AM       Component Value Range    Sodium 139  136 - 145 MMOL/L    Potassium 4.2  3.5 - 5.5 MMOL/L    Chloride 103  100 - 108 MMOL/L    CO2 27  21 - 32 MMOL/L    Anion gap 9  3.0 - 18 mmol/L    Glucose 124 (*) 74 - 99 MG/DL    BUN 12  7.0 - 18 MG/DL    Creatinine 1.61  0.6 - 1.3 MG/DL    BUN/Creatinine ratio 17  12 - 20      GFR est AA >60  >60 ml/min/1.42m2    GFR est non-AA >60  >60 ml/min/1.42m2    Calcium 9.0  8.5 - 10.1 MG/DL    Bilirubin, total 0.5  0.2 - 1.0 MG/DL    ALT 24  09.6 - 04.5 U/L    AST 14 (*) 15 - 37 U/L    Alk. phosphatase 104  50 - 136 U/L    Protein, total 6.4  6.4 - 8.2 g/dL    Albumin 3.4  3.4 - 5.0 g/dL    Globulin 3.0  2.0 - 4.0 g/dL    A-G Ratio 1.1  0.8 - 1.7     MAGNESIUM    Collection Time    11/05/11  4:15 AM       Component Value Range    Magnesium 1.6 (*) 1.8 - 2.4 MG/DL           Current Facility-Administered Medications   Medication Dose Route Frequency   ??? phenytoin (DILANTIN) 1,000 mg in 0.9% sodium chloride 250 mL IVPB  1,000 mg IntraVENous NOW   ??? phenytoin (DILANTIN) injection 100 mg  100 mg IntraVENous Q8H   ??? dexamethasone (DECADRON) 10 mg in 0.9% sodium chloride 50 mL IVPB  10 mg IntraVENous Q6H   ??? lidocaine (PF) (XYLOCAINE) 10 mg/mL (1 %) injection Soln 5 mL  5 mL SubCUTAneous ONCE   ??? sodium chloride (NS) flush 10-30 mL  10-30 mL InterCATHeter PRN   ??? bacitracin 500 unit/gram packet 1 Packet  1 Packet Topical PRN   ??? carvedilol (COREG) tablet 6.25 mg  6.25 mg Oral BID WITH MEALS   ??? fluticasone (FLONASE) 50 mcg/actuation nasal spray 2 Spray  2 Spray Both Nostrils DAILY   ??? montelukast (SINGULAIR) tablet 10 mg  10 mg Oral QHS   ??? rosuvastatin (CRESTOR) tablet 5 mg  5 mg Oral QHS   ??? traMADol (ULTRAM) tablet 50 mg  50 mg Oral Q6H PRN   ??? sodium chloride (NS) flush 5-10  mL  5-10 mL IntraVENous Q8H   ??? sodium chloride (NS) flush 5-10 mL  5-10 mL IntraVENous PRN   ??? acetaminophen (TYLENOL) tablet 650 mg  650 mg Oral Q4H PRN   ??? HYDROmorphone (PF) (DILAUDID) injection 1 mg  1 mg IntraVENous Q4H PRN   ??? naloxone (NARCAN) injection 0.4 mg  0.4 mg IntraVENous PRN   ??? ondansetron (ZOFRAN) injection 4 mg  4 mg IntraVENous Q4H PRN   ??? ondansetron (ZOFRAN ODT) tablet 4 mg  4 mg Oral Q4H PRN   ??? ioversol (OPTIRAY) 320  mg iodine/mL contrast injection 1-125 mL  1-125 mL IntraVENous RAD ONCE     Facility-Administered Medications Ordered in Other Encounters   Medication Dose Route Frequency   ??? phenytoin (DILANTIN) 1,350 mg in 0.9% sodium chloride 250 mL IVPB  1,350 mg IntraVENous NOW           ASSESSMENT:  Patient Active Problem List   Diagnosis Code   ??? CHF (congestive heart failure) 428.0   ??? 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 Fibroid 218.9   ??? HTN (hypertension) 401.9   ??? History of elevated lipids V12.29   ??? Gallstones 574.20   ??? Insomnia 780.52   ??? OSA (obstructive sleep apnea) 327.23   ??? Cataract 366.9   ??? Cardiomyopathy 425.4   ??? AICD (automatic cardioverter/defibrillator) present V45.02   ??? Chronic systolic heart failure 428.22   ??? 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 Automatic implantable cardiac defibrillator in situ V45.02   ??? Hypotension, unspecified 458.9   ??? Other and unspecified hyperlipidemia 272.4   ??? AR (allergic rhinitis) 477.9   ??? 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 Dilantin load and start Decadron as recommended. Cardiology clearance requested for planned crani for tumor resection Saturday.        Assessment and plan made with Dr.  Earlene Plater, NP  11/05/2011  11:32 AM

## 2011-11-05 NOTE — Progress Notes (Signed)
INTERNAL MEDICINE  Daily Progress Note    Patient:  Cathy [name]  Date of Admission:  11/04/2011    Interval History and Events of the last 24 hours:  In good spirits   No complaints of pain or visual disturbances   Denies any weakness       Assessment/Plan:       Patient seen in follow up for multiple medical problems as listed below :    Patient Active Problem List   Diagnosis Code   ??? CHF (congestive heart failure) 428.0   ??? 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 Fibroid 218.9   ??? HTN (hypertension) 401.9   ??? History of elevated lipids V12.29   ??? Gallstones 574.20   ??? Insomnia 780.52   ??? OSA (obstructive sleep apnea) 327.23   ??? Cataract 366.9   ??? Cardiomyopathy 425.4   ??? AICD (automatic cardioverter/defibrillator) present V45.02   ??? Chronic systolic heart failure 428.22   ??? 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 Automatic implantable cardiac defibrillator in situ V45.02   ??? Hypotension, unspecified 458.9   ??? Other and unspecified hyperlipidemia 272.4   ??? AR (allergic rhinitis) 477.9   ??? Brain tumor (benign) 225.0       General:      Code Status:  .Full Code     Plan  Right frontal tumor  presenting with seizure   Patient has been reviewed by neurosurgery. Will continue steroids as per neurosurgery- Plan for craniotomy noted   Decadron and Dilantin     CHF (congestive heart failure) with AICD and BiV pacer in situ   Patient states that she has an EF of 20% and used to get short of breath but this has improved since BiV and AICD placement. Currently well compensated.Cardiology consulted by NS for clearance       Hypothyroid hx goiter  Will continue regular medications.     HTN (hypertension)  Will continue all regular medications, monitor ans adjust as needed.     OSA (obstructive sleep apnea  Did previously use CPAP but has not used for a long time as she felt congested in them mornings.    Hypomag  Replace per protocol   Disposition and Family:   Recommend to continue hospitalization. Discussed with patient and  nursing staff.    Labwork and Ancillary Studies     All labs/tests/imaging reviewed.Spoke with the nurse regarding patient issues.    Labwork:    CBC w/Diff   Lab Results   Component Value Date/Time    WBC 8.3 11/05/2011  4:15 AM    HGB 11.9 11/05/2011  4:15 AM    HCT 36.5 11/05/2011  4:15 AM    PLATELET 225 11/05/2011  4:15 AM    MCV 90.8 11/05/2011  4:15 AM        Basic Metabolic Profile   Lab Results   Component Value Date/Time    Sodium 139 11/05/2011  4:15 AM    Potassium 4.2 11/05/2011  4:15 AM    Chloride 103 11/05/2011  4:15 AM    CO2 27 11/05/2011  4:15 AM    Anion gap 9 11/05/2011  4:15 AM    Glucose 124 11/05/2011  4:15 AM    BUN 12 11/05/2011  4:15 AM    Creatinine 0.71 11/05/2011  4:15 AM    BUN/Creatinine ratio 17 11/05/2011  4:15 AM    GFR est non-AA >60 11/05/2011  4:15 AM    Calcium 9.0 11/05/2011  4:15 AM    GFR est AA >60 11/05/2011  4:15 AM        Cardiac Enzymes   Lab Results   Component Value Date/Time    CK 41 11/04/2011  8:15 AM    CK - MB <0.5 11/04/2011  8:15 AM    CK-MB Index CANNOT BE CALCULATED 11/04/2011  8:15 AM    Troponin-I, Qt. <0.02 11/04/2011  8:15 AM      Arterial Blood Gases   No results found for this basename: ph, phi, pco2, pco2i, po2, po2i, hco3, hco3i, fio2, fio2i      Coagulation      No results found for this basename: PTTP, APTT, PTP, INR, INRT      Hepatic Function   Lab Results   Component Value Date/Time    ALT 24 11/05/2011  4:15 AM    AST 14 11/05/2011  4:15 AM    Alk. phosphatase 104 11/05/2011  4:15 AM    Bilirubin, direct 0.1 06/07/2008 10:46 AM    Bilirubin, total 0.5 11/05/2011  4:15 AM          Current Inpatient Meds and Allergies     Medications:  Current Facility-Administered Medications   Medication Dose Route Frequency   ??? phenytoin (DILANTIN) 1,000 mg in 0.9% sodium chloride 250 mL IVPB  1,000 mg IntraVENous NOW   ??? phenytoin (DILANTIN) injection 100 mg  100 mg IntraVENous Q8H   ??? dexamethasone (DECADRON) 10 mg in 0.9% sodium chloride 50 mL IVPB  10 mg IntraVENous Q6H   ???  lidocaine (PF) (XYLOCAINE) 10 mg/mL (1 %) injection Soln 5 mL  5 mL SubCUTAneous ONCE   ??? sodium chloride (NS) flush 10-30 mL  10-30 mL InterCATHeter PRN   ??? bacitracin 500 unit/gram packet 1 Packet  1 Packet Topical PRN   ??? perflutren lipid microspheres (DEFINITY) in NS bolus IV  1 mL IntraVENous PRN   ??? carvedilol (COREG) tablet 6.25 mg  6.25 mg Oral BID WITH MEALS   ??? fluticasone (FLONASE) 50 mcg/actuation nasal spray 2 Spray  2 Spray Both Nostrils DAILY   ??? montelukast (SINGULAIR) tablet 10 mg  10 mg Oral QHS   ??? rosuvastatin (CRESTOR) tablet 5 mg  5 mg Oral QHS   ??? traMADol (ULTRAM) tablet 50 mg  50 mg Oral Q6H PRN   ??? sodium chloride (NS) flush 5-10 mL  5-10 mL IntraVENous Q8H   ??? sodium chloride (NS) flush 5-10 mL  5-10 mL IntraVENous PRN   ??? acetaminophen (TYLENOL) tablet 650 mg  650 mg Oral Q4H PRN   ??? HYDROmorphone (PF) (DILAUDID) injection 1 mg  1 mg IntraVENous Q4H PRN   ??? naloxone (NARCAN) injection 0.4 mg  0.4 mg IntraVENous PRN   ??? ondansetron (ZOFRAN) injection 4 mg  4 mg IntraVENous Q4H PRN   ??? ondansetron (ZOFRAN ODT) tablet 4 mg  4 mg Oral Q4H PRN   ??? ioversol (OPTIRAY) 320 mg iodine/mL contrast injection 1-125 mL  1-125 mL IntraVENous RAD ONCE      ROS  Constitutional: Negative for diaphoresis. Positive for weakness   HENT: Negative for congestion.   Respiratory: Negative for cough and shortness of breath.   Cardiovascular: Negative for chest pain and palpitations.   Gastrointestinal: Negative for diarrhea.   Genitourinary: Negative for flank pain.   Musculoskeletal: Negative for back pain.   Skin: Negative for pallor.   Neurological: Nothing except in H&P - shaking and posturing of the right arm  Objective:       BP 105/56   Pulse 77   Temp 98 ??F (36.7 ??C)   Resp 19   Ht 5\' 10"  (1.778 m)   Wt 89.359 kg (197 lb)   BMI 28.27 kg/m2   SpO2 100%    Intake/Output Summary (Last 24 hours) at 11/05/11 1402  Last data filed at 11/05/11 1400   Gross per 24 hour   Intake    300 ml   Output   1750 ml    Net  -1450 ml       General appearance: Alert, cooperative, no distress, appears stated age   Head: Normocephalic, without obvious abnormality, atraumatic   Neck: Supple, no lymphadenopathy or thyromegaly, trachea midline   Lungs: Clear to auscultation bilaterally   Heart: Regular rate and rhythm, S1, S2 normal, no murmur, click, rub or gallop   Abdomen: Soft, non-tender and not-distended. Bowel sounds normal. No masses, no organomegaly   Extremities: Extremities normal, atraumatic, no cyanosis or edema   Skin: Skin color, texture, turgor normal. No rashes or lesions   Neurologic: Grossly normal and non focal, normal muscle tone and power, cranial nerves are intact, normal sensation  Motor exam normal except for left pronator drift and sensory and cerebellar normal.      Total time spent with chart review, patient examination/education, discussion with staff on case,documentation and medication management / adjustment:  30 Minutes    It is always a pleasure to be involved in the clinical care of this patient.    Roderic Scarce, MD  November 05, 2011   [facility]  Pager:  [phone]

## 2011-11-05 NOTE — Progress Notes (Addendum)
0800-PT assessment complete.  Patient is resting quietly awaiting breakfast trays.  Spoke with Biagio Borg, NP regarding PICC insertion, recommended meds, and NPO status.  Orders placed by Biagio Borg, NP for PICC insertion, Dilantin and Decadron, as well as regular diet.    1610- PICC insertion nurse on floor to perform procedure.    1225-PICC insertion complete and awaiting results of x-ray.  Patient bathed self and had a bowel movement on the bedside commode.  Patient resting quietly with visitors in room.    1715-PT up from bed and into chair for dinner.    1830-PT returned to bed.

## 2011-11-05 NOTE — Other (Signed)
Shift change report given to Delfin Gant RN (oncoming nurse) by Janalee Dane RN (offgoing nurse).  Report given with SBAR, KARDEX, and LAB RESULTS.

## 2011-11-05 NOTE — Consults (Signed)
Assessment:      Patient Active Problem List    Diagnosis  Code    ???  CHF (congestive heart failure)  428.0    ???  Hypothyroid hx goiter  244.9    ???  Fibroid  218.9    ???  HTN (hypertension)  401.9    ???  History of elevated lipids  V12.29    ???  Gallstones  574.20    ???  Insomnia  780.52    ???  OSA (obstructive sleep apnea)  327.23    ???  Cataract  366.9    ???  Cardiomyopathy  425.4    ???  AICD (automatic cardioverter/defibrillator) present  V45.02    ???  Chronic systolic heart failure  428.22    ???  Other primary cardiomyopathies  425.4    ???  Automatic implantable cardiac defibrillator in situ  V45.02    ???  Hypotension, unspecified  458.9    ???  Other and unspecified hyperlipidemia  272.4    ???  AR (allergic rhinitis)  477.9    ???  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   Coronary angiogram in 2008. Normal coronaries   Mild fluid overload on exam   Patient denies any ICD shock however in view of syncopal episodes, will interrogate ICD   - Will give Lasix 40 mg IV once today   - Echo done today. Will review   - Continue coreg   - Will have ICD interrogated.   - Historically not on ACE-i because of hypotension.     History of Present Illness:    This is a 61 y.o. female admitted for Brain mass;Brain mass.   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   Known patient of Dr.A.Allena Katz, She has CHF, hypothyroidism, fibroids, HTN, left breast mass, OSA, who was transferred to this facility from [facility] with a new brain  tumor.   Patient does not recall entire event. However tells me that she passed out yesterday and found herself in ED.   According to ED note : As per her husband the patient "woke up" early this morning at about 07:00 and sat up in bed - her husband felt that she was trying to say something but could not speak and then she began "posturing and shaking" her right arm. He thought she was having a stroke and called EMS. The patient has no memory of any of the days events. On ED arrival, Medics reported that they noted a change in the patient's mental status and a drop in her BP with a systolic of 80. The patient's condition was discussed to the ER physician, Llana Aliment, DO by Dr Alphonzo Severance, Neurology and by the ER physician at [facility], Dr Westley Foots. Dr Alphonzo Severance advised administering Dilantin and Decadron and to order a MRI with and without contrast. The  patient is to be admitted to the hospitalists.   Currently patient denies any complaint.   Seen Dr.A.Asriel Westrup in 08/2011 and she was stable. No change in meds was made.   She never had chest pain or palpitation or syncopal episode before.   She was thought to have seizure because of frontal brain tumor. Plan for surgery on Saturday   Currently denies any complaint   Stable DOE after walking 3 block   2 pillows at night   Review of Symptoms: Except as stated above include:   Constitutional: negative   Respiratory: negative   Cardiovascular: negative   Gastrointestinal: negative   Genitourinary: negative   Musculoskeletal: negative   Neurological: negative   Past Medical History:      Past Medical History    Diagnosis  Date    ???  CHF (congestive heart failure)  06/23/2008      Non-ischemic CMP , Cath (2008) Normal coronary arteries    ???  Hypothyroid hx goiter  06/23/2008      radioactive iodine ablation of thyroid    ???  Fibroid  06/23/2008    ???  HTN (hypertension)  06/23/2008    ???  HLD (hyperlipidemia)     ???  Breast mass, left       benign    ???  OSA (obstructive sleep  apnea)  9/10    ???  Chronic systolic heart failure       Stable,    ???  Automatic implantable cardiac defibrillator in situ       Post ddd icd Set up carelink    ???  Hypotension, unspecified       Related to lisinopril      Social History:      History      Social History    ???  Marital Status:  MARRIED      Spouse Name:  N/A      Number of Children:  N/A    ???  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  Smoking status:  Never Smoker    ???  Smokeless tobacco:  Never Used    ???  Alcohol Use:  No    ???  Drug Use:  No    ???  Sexually Active:  Not on file      Other Topics  Concern    ???  Not on file      Social History Narrative    ???  No narrative on file      Family History:      Family History    Problem  Relation  Age of Onset    ???  Diabetes  Mother     ???  Heart Attack  Father         MI      ???  Heart Disease  Maternal Grandmother     ???  Heart Disease  Paternal Grandmother     ???  Kidney Disease  Other         1 sib deceased      ???  Cancer  Other         1 sib throat cancer      ???  Diabetes  Other     ???  Diabetes  Daughter     ???  Other  Daughter         leukemia      ???  Hypertension  Other       Medications:      Allergies    Allergen  Reactions    ???  Pcn (Penicillins)  Itching      Itching bumps on hands and arms      Current Facility-Administered Medications    Medication  Dose  Route  Frequency    ???  phenytoin (DILANTIN) 1,000 mg in 0.9% sodium chloride 250 mL IVPB  1,000 mg  IntraVENous  NOW    ???  phenytoin (DILANTIN) injection 100 mg  100 mg  IntraVENous  Q8H    ???  dexamethasone (DECADRON) 10 mg in 0.9% sodium chloride 50 mL IVPB  10 mg  IntraVENous  Q6H    ???  lidocaine (PF) (XYLOCAINE) 10 mg/mL (1 %) injection Soln 5 mL  5 mL  SubCUTAneous  ONCE    ???  sodium chloride (NS) flush 10-30 mL  10-30 mL  InterCATHeter  PRN    ???  bacitracin 500 unit/gram packet 1 Packet  1 Packet  Topical  PRN    ???  perflutren lipid microspheres (DEFINITY) in NS bolus IV  1 mL   IntraVENous  PRN    ???  DISCONTD: dexamethasone (DECADRON) injection 10 mg  10 mg  IntraVENous  Q6H    ???  DISCONTD: phenytoin (DILANTIN) injection 100 mg  100 mg  IntraVENous  Q8H    ???  DISCONTD: dexamethasone (DECADRON) injection 10 mg  10 mg  IntraVENous  Q6H    ???  carvedilol (COREG) tablet 6.25 mg  6.25 mg  Oral  BID WITH MEALS    ???  fluticasone (FLONASE) 50 mcg/actuation nasal spray 2 Spray  2 Spray  Both Nostrils  DAILY    ???  montelukast (SINGULAIR) tablet 10 mg  10 mg  Oral  QHS    ???  rosuvastatin (CRESTOR) tablet 5 mg  5 mg  Oral  QHS    ???  traMADol (ULTRAM) tablet 50 mg  50 mg  Oral  Q6H PRN    ???  sodium chloride (NS) flush 5-10 mL  5-10 mL  IntraVENous  Q8H    ???  sodium chloride (NS) flush 5-10 mL  5-10 mL  IntraVENous  PRN    ???  acetaminophen (TYLENOL) tablet 650 mg  650 mg  Oral  Q4H PRN    ???  HYDROmorphone (PF) (DILAUDID) injection 1 mg  1 mg  IntraVENous  Q4H PRN    ???  naloxone (NARCAN) injection 0.4 mg  0.4 mg  IntraVENous  PRN    ???  ondansetron (ZOFRAN) injection 4 mg  4 mg  IntraVENous  Q4H PRN    ???  ondansetron (ZOFRAN ODT) tablet 4 mg  4 mg  Oral  Q4H PRN    ???  ioversol (OPTIRAY) 320 mg iodine/mL contrast injection 1-125 mL  1-125 mL  IntraVENous  RAD ONCE    ???  DISCONTD: sodium chloride (NS) flush 5-10 mL  5-10 mL  IntraVENous  Q8H    ???  DISCONTD: sodium chloride (NS) flush 5-10 mL  5-10 mL  IntraVENous  PRN      Prescriptions prior to admission    Medication  Sig    ???  traMADol (ULTRAM) 50 mg tablet  Take 1 Tab by mouth every six (6) hours as needed for Pain.    ???  fluticasone (FLONASE) 50  mcg/actuation nasal spray  2 Sprays by Both Nostrils route daily.    ???  rosuvastatin (CRESTOR) 5 mg tablet  Take 1 Tab by mouth nightly.    ???  montelukast (SINGULAIR) 10 mg tablet  Take 1 Tab by mouth nightly.    ???  ibuprofen (MOTRIN) 800 mg tablet  Take 1 Tab by mouth every eight (8) hours as needed for Pain.    ???  carvedilol (COREG) 6.25 mg tablet  TAKE ONE TABLET BY MOUTH TWICE DAILY    ???  aspirin (ASPIRIN)  325 mg tablet  Take 325 mg by mouth every four (4) hours as needed.      Physical Exam:      Visit Vitals    Item  Reading    ???  BP  105/56    ???  Pulse  77    ???  Temp  98 ??F (36.7 ??C)    ???  Resp  19    ???  Ht  5\' 10"  (1.778 m)    ???  Wt  197 lb (89.359 kg)    ???  BMI  28.27 kg/m2    ???  SpO2  100%      BP Readings from Last 3 Encounters:    11/05/11  105/56    11/04/11  113/70    09/29/11  110/70      Pulse Readings from Last 3 Encounters:    11/05/11  77    11/04/11  88    09/29/11  76      Wt Readings from Last 3 Encounters:    11/05/11  197 lb (89.359 kg)    11/04/11  197 lb (89.359 kg)    09/29/11  207 lb (93.895 kg)      General: alert, cooperative, no distress, appears stated age   Neck: no carotid bruit, no JVD   Lungs: Few basilar rales   Heart: regular rate and rhythm, S1, S2 normal, no murmur, click, rub or gallop   Abdomen: abdomen is soft without significant tenderness, masses, organomegaly or guarding   Extremities: no edema   No skin rash   Data Review:    EKG;   Normal sinus rhythm  Left ventricular hypertrophy with repolarization abnormality  T wave abnormality, consider lateral ischemia ( Unchanged )  Abnormal ECG  When compared with ECG of 15-Dec-2010 13:11,  No significant change was found   Recent Labs    [facility]  11/05/11 0415  11/04/11 2140  11/04/11 0815     WBC  8.3  6.1  4.4*     HGB  11.9*  12.3  12.2     HCT  36.5  37.3  38.7     PLT  225  206  214      Recent Labs    Basename  11/05/11 0415  11/04/11 2140  11/04/11 0815     NA  139  141  141     K  4.2  4.6  4.2     CL  103  105  105     CO2  27  25  25      GLU  124*  172*  132*     BUN  12  11  13      CREA  0.71  0.59*  1.00     CA  9.0  8.4*  8.4*     MG  1.6*  1.8  --     PHOS  --  --  --  ALB  3.4  3.4  --     TBIL  0.5  0.3  --     SGOT  14*  22  --     INR  --  --  --      Recent Labs    Basename  11/04/11 0815     TROIQ  --     CPK  41     CKMB  --      Last Lipid:   Lab Results    Component  Value  Date/Time     Cholesterol, total   229  09/02/2011 9:07 AM     HDL Cholesterol  56  09/02/2011 9:07 AM     LDL, calculated  164  09/02/2011 9:07 AM     Triglyceride  47  09/02/2011 9:07 AM     CHOL/HDL Ratio  3.4  11/23/2008 16:10 AM      Lab Results    Component  Value  Date/Time     CK  41  11/04/2011 8:15 AM     CK - MB  <0.5  11/04/2011 8:15 AM     CK-MB Index  CANNOT BE CALCULATED  11/04/2011 8:15 AM     Troponin-I, Qt.  <0.02  11/04/2011 8:15 AM      Signed By:  Tretha Sciara, MD     November 05, 2011

## 2011-11-05 NOTE — Progress Notes (Signed)
NUTRITIONAL ASSESSMENT     KAIYAH DIMATTEO           61 y.o.           11/04/2011                 1. Sinus headache    2. AR (allergic rhinitis)    3. Wheezing    4. High cholesterol    5. Leg pain    6. Brain mass    7. HTN (hypertension)    8. Chronic systolic heart failure         [x]   No Cultural, religious or ethnic dietary need identified.   []   Cultural, religious and ethnic food preferences identified and addressed    []   Participated in discharge planning/Interdisciplinary rounds   Food allergies: [x]   No        []   Yes-  ASSESSMENT:     Pt is 124% ideal wt, adequate fat and protein stores; currently well nourished.  Observed today eating 100% of her breakfast.  Tolerating the regular diet well.  No nutrition dx at this time.  BG elevated from steroids, may benefit from Novolog corrective insulin and POC BG monitoring.        SUBJECTIVE/OBJECTIVE:   Information obtained from:patient and husband  Follows a regular diet at home, wt is stable per pt.    Diet: regular  No data found.    Medications: [x]                 Reviewed     Most Recent POC Glucose:   Recent Labs   Basename 11/05/11 0415 11/04/11 2140 11/04/11 0815    GLU 124* 172* 132*         Labs:   No results found for this basename: HBA1C, HGBE8, HA1CLT     Lab Results   Component Value Date/Time    Sodium 139 11/05/2011  4:15 AM    Potassium 4.2 11/05/2011  4:15 AM    Chloride 103 11/05/2011  4:15 AM    CO2 27 11/05/2011  4:15 AM    Anion gap 9 11/05/2011  4:15 AM    Glucose 124 11/05/2011  4:15 AM    BUN 12 11/05/2011  4:15 AM    Creatinine 0.71 11/05/2011  4:15 AM    Calcium 9.0 11/05/2011  4:15 AM    Magnesium 1.6 11/05/2011  4:15 AM    Albumin 3.4 11/05/2011  4:15 AM       Anthropometrics: IBW : 72 kg (158 lb 11.7 oz), % IBW (Calculated): 124.11 %, BMI (calculated): 28.3   Wt Readings from Last 1 Encounters:   11/05/11 89.359 kg (197 lb)      Ht Readings from Last 1 Encounters:   11/05/11 5\' 10"  (1.778 m)       Estimated Nutrition Needs: 2130 Kcals/day,  Protein (g): 98 g Fluid (ml): 2600 ml  Based on:   [x]           Actual BW    []           IBW   []             Adjusted BW        Nutrition Diagnoses:   Diagnosis-Intake: Other (comment) None at this time.      Nutrition Interventions:  Goal: Adequate nutrient intake in preparation for surgery. Reassess nutrition needs post op.   Referral to glycemic control team if BG does not meet inpatient range.  Nutrition Monitoring and Evaluation      [x]      Monitor po intake on meal rounds  [x]      Continue inpatient monitoring and intervention post op  [x]      Other: Monitor for elevated BG      Nutrition Risk:  []    High     [x]   Moderate    []   Minimal/Uncompromised    JUDY HEIDENTHAL, RD

## 2011-11-05 NOTE — Progress Notes (Signed)
S: headache gone, no complaints  O: afeb, VSS, neuro intact  A: stable  P: for surgery on Saturday after 72 hour steroid prep.

## 2011-11-05 NOTE — Other (Signed)
0715 Bedside and Verbal shift change report received from K. [employer]/gallery conservator) to [employer] (Cabin crew) . Report given with SBAR, Kardex, Intake/Output, Accordion and Recent Results.

## 2011-11-06 LAB — CBC WITH AUTOMATED DIFF
ABS. BASOPHILS: 0 10*3/uL (ref 0.0–0.06)
ABS. EOSINOPHILS: 0 10*3/uL (ref 0.0–0.4)
ABS. LYMPHOCYTES: 0.6 10*3/uL — ABNORMAL LOW (ref 0.9–3.6)
ABS. MONOCYTES: 0.1 10*3/uL (ref 0.05–1.2)
ABS. NEUTROPHILS: 7.8 10*3/uL (ref 1.8–8.0)
BASOPHILS: 0 % (ref 0–2)
EOSINOPHILS: 0 % (ref 0–5)
HCT: 36.6 % (ref 35.0–45.0)
HGB: 12.1 g/dL (ref 12.0–16.0)
LYMPHOCYTES: 7 % — ABNORMAL LOW (ref 21–52)
MCH: 30.1 PG (ref 24.0–34.0)
MCHC: 33.1 g/dL (ref 31.0–37.0)
MCV: 91 FL (ref 74.0–97.0)
MONOCYTES: 1 % — ABNORMAL LOW (ref 3–10)
MPV: 11.7 FL (ref 9.2–11.8)
NEUTROPHILS: 92 % — ABNORMAL HIGH (ref 40–73)
PLATELET: 203 10*3/uL (ref 135–420)
RBC: 4.02 M/uL — ABNORMAL LOW (ref 4.20–5.30)
RDW: 12.9 % (ref 11.6–14.5)
WBC: 8.5 10*3/uL (ref 4.6–13.2)

## 2011-11-06 LAB — METABOLIC PANEL, COMPREHENSIVE
A-G Ratio: 1.1 (ref 0.8–1.7)
ALT (SGPT): 22 U/L (ref 12.0–78.0)
AST (SGOT): 11 U/L — ABNORMAL LOW (ref 15–37)
Albumin: 3.5 g/dL (ref 3.4–5.0)
Alk. phosphatase: 100 U/L (ref 50–136)
Anion gap: 11 mmol/L (ref 3.0–18)
BUN/Creatinine ratio: 20 (ref 12–20)
BUN: 12 MG/DL (ref 7.0–18)
Bilirubin, total: 0.3 MG/DL (ref 0.2–1.0)
CO2: 27 MMOL/L (ref 21–32)
Calcium: 8.5 MG/DL (ref 8.5–10.1)
Chloride: 103 MMOL/L (ref 100–108)
Creatinine: 0.61 MG/DL (ref 0.6–1.3)
GFR est AA: >60 mL/min/{1.73_m2} (ref >60)
GFR est non-AA: >60 mL/min/{1.73_m2} (ref >60)
Globulin: 3.2 g/dL (ref 2.0–4.0)
Glucose: 165 MG/DL — ABNORMAL HIGH (ref 74–99)
Potassium: 4.3 MMOL/L (ref 3.5–5.5)
Protein, total: 6.7 g/dL (ref 6.4–8.2)
Sodium: 141 MMOL/L (ref 136–145)

## 2011-11-06 LAB — MAGNESIUM: Magnesium: 1.8 MG/DL (ref 1.8–2.4)

## 2011-11-06 MED ADMIN — dexamethasone (DECADRON) 10 mg in 0.9% sodium chloride 50 mL IVPB: INTRAVENOUS | @ 09:00:00 | NDC 00641036721

## 2011-11-06 MED ADMIN — dexamethasone (DECADRON) 6 mg in 0.9% sodium chloride 50 mL IVPB: INTRAVENOUS | @ 23:00:00 | NDC 00641036721

## 2011-11-06 MED ADMIN — phenytoin (DILANTIN) injection 100 mg: INTRAVENOUS | @ 06:00:00 | NDC 00641049321

## 2011-11-06 MED ADMIN — montelukast (SINGULAIR) tablet 10 mg: ORAL | @ 02:00:00 | NDC 68084062011

## 2011-11-06 MED ADMIN — sodium chloride (NS) flush 5-10 mL: INTRAVENOUS | @ 02:00:00 | NDC 87701099893

## 2011-11-06 MED ADMIN — phenytoin (DILANTIN) injection 100 mg: INTRAVENOUS | @ 22:00:00 | NDC 00641049321

## 2011-11-06 MED ADMIN — sodium chloride (NS) flush 5-10 mL: INTRAVENOUS | @ 22:00:00 | NDC 87701099893

## 2011-11-06 MED ADMIN — phenytoin (DILANTIN) injection 100 mg: INTRAVENOUS | @ 14:00:00 | NDC 00641049321

## 2011-11-06 MED ADMIN — carvedilol (COREG) tablet 6.25 mg: ORAL | @ 14:00:00 | NDC 68084026111

## 2011-11-06 MED ADMIN — dexamethasone (DECADRON) 6 mg in 0.9% sodium chloride 50 mL IVPB: INTRAVENOUS | @ 18:00:00 | NDC 00641036721

## 2011-11-06 MED ADMIN — dexamethasone (DECADRON) 10 mg in 0.9% sodium chloride 50 mL IVPB: INTRAVENOUS | @ 04:00:00 | NDC 00641036721

## 2011-11-06 MED ADMIN — fluticasone (FLONASE) 50 mcg/actuation nasal spray 2 Spray: NASAL | @ 14:00:00 | NDC 50383070016

## 2011-11-06 MED ADMIN — carvedilol (COREG) tablet 6.25 mg: ORAL | @ 22:00:00 | NDC 68084026111

## 2011-11-06 MED ADMIN — rosuvastatin (CRESTOR) tablet 5 mg: ORAL | @ 02:00:00 | NDC 00310075139

## 2011-11-06 MED ADMIN — sodium chloride (NS) flush 5-10 mL: INTRAVENOUS | @ 09:00:00 | NDC 87701099893

## 2011-11-06 MED FILL — PHENYTOIN SODIUM 50 MG/ML IV: 50 mg/mL | INTRAVENOUS | Qty: 2

## 2011-11-06 MED FILL — DEXAMETHASONE SODIUM PHOSPHATE 10 MG/ML IJ SOLN: 10 mg/mL | INTRAMUSCULAR | Qty: 0.6

## 2011-11-06 MED FILL — CARVEDILOL 3.125 MG TAB: 3.125 mg | ORAL | Qty: 2

## 2011-11-06 MED FILL — SINGULAIR 10 MG TABLET: 10 mg | ORAL | Qty: 1

## 2011-11-06 MED FILL — CRESTOR 10 MG TABLET: 10 mg | ORAL | Qty: 1

## 2011-11-06 NOTE — Other (Signed)
Bedside and Verbal shift change report given to R. Matilde Haymaker, RN (Cabin crew) by A. Wynona Neat, RN (offgoing nurse).  Report given with SBAR, Kardex, Intake/Output, MAR and Med Rec Status.

## 2011-11-06 NOTE — Other (Signed)
Shift change report received from Delfin Gant RN (offgoing nurse) to Janalee Dane RN (oncoming nurse). Report given with SBAR, KARDEX, and RECENT RESULTS.

## 2011-11-06 NOTE — Progress Notes (Signed)
11/06/2011  7:43 AM  Shift summary:  Uneventful night. Pt remained stable throughout shift. Neuro status intact. VS WNL.

## 2011-11-06 NOTE — Progress Notes (Signed)
INTERNAL MEDICINE  Daily Progress Note    Patient:  Cathy [name]  Date of Admission:  11/04/2011    Interval History and Events of the last 24 hours:  In good spirits - having lunch with family at bedside   No complaints of pain or visual disturbances   Denies any weakness       Assessment/Plan:       Patient seen in follow up for multiple medical problems as listed below :    Patient Active Problem List   Diagnosis Code   ??? CHF (congestive heart failure) 428.0   ??? 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 Fibroid 218.9   ??? HTN (hypertension) 401.9   ??? History of elevated lipids V12.29   ??? Gallstones 574.20   ??? Insomnia 780.52   ??? OSA (obstructive sleep apnea) 327.23   ??? Cataract 366.9   ??? Cardiomyopathy 425.4   ??? AICD (automatic cardioverter/defibrillator) present V45.02   ??? Chronic systolic heart failure 428.22   ??? 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 Automatic implantable cardiac defibrillator in situ V45.02   ??? Hypotension, unspecified 458.9   ??? Other and unspecified hyperlipidemia 272.4   ??? AR (allergic rhinitis) 477.9   ??? Brain tumor (benign) 225.0       General:      Code Status:  .Full Code     Plan    Right frontal tumor  presenting with seizure   Patient has been reviewed by neurosurgery. Will continue steroids as per neurosurgery-   Plan for craniotomy noted -Decadron and Dilantin     CHF (congestive heart failure) with AICD and BiV pacer in situ   Patient states that she has an EF of 20% and used to get short of breath but this has improved since BiV and AICD placement.    cardiology seen the pt   - continue current cardiac regimen with carvedilol only ,- would  consider spironolactone postOP      Hypothyroid hx goiter  Will continue regular medications.     HTN (hypertension)  Will continue all regular medications, monitor ans adjust as needed.     OSA (obstructive sleep apnea  Did previously use CPAP but has not used for a long time as she felt congested in them mornings.    Hypomag  Replace per  protocol   Disposition and Family:   Recommend to continue hospitalization. Discussed with patient and nursing staff.    Labwork and Ancillary Studies     All labs/tests/imaging reviewed.Spoke with the nurse regarding patient issues.    Labwork:    CBC w/Diff   Lab Results   Component Value Date/Time    WBC 8.5 11/06/2011  3:40 AM    HGB 12.1 11/06/2011  3:40 AM    HCT 36.6 11/06/2011  3:40 AM    PLATELET 203 11/06/2011  3:40 AM    MCV 91.0 11/06/2011  3:40 AM        Basic Metabolic Profile   Lab Results   Component Value Date/Time    Sodium 141 11/06/2011  3:40 AM    Potassium 4.3 11/06/2011  3:40 AM    Chloride 103 11/06/2011  3:40 AM    CO2 27 11/06/2011  3:40 AM    Anion gap 11 11/06/2011  3:40 AM    Glucose 165 11/06/2011  3:40 AM    BUN 12 11/06/2011  3:40 AM    Creatinine 0.61 11/06/2011  3:40 AM    BUN/Creatinine  ratio 20 11/06/2011  3:40 AM    GFR est non-AA >60 11/06/2011  3:40 AM    Calcium 8.5 11/06/2011  3:40 AM    GFR est AA >60 11/06/2011  3:40 AM        Cardiac Enzymes   Lab Results   Component Value Date/Time    CK 41 11/04/2011  8:15 AM    CK - MB <0.5 11/04/2011  8:15 AM    CK-MB Index CANNOT BE CALCULATED 11/04/2011  8:15 AM    Troponin-I, Qt. <0.02 11/04/2011  8:15 AM      Arterial Blood Gases   No results found for this basename: ph,  phi,  pco2,  pco2i,  po2,  po2i,  hco3,  hco3i,  fio2,  fio2i      Coagulation      No results found for this basename: PTTP,  APTT,  PTP,  INR,  INRT      Hepatic Function   Lab Results   Component Value Date/Time    ALT 22 11/06/2011  3:40 AM    AST 11 11/06/2011  3:40 AM    Alk. phosphatase 100 11/06/2011  3:40 AM    Bilirubin, direct 0.1 06/07/2008 10:46 AM    Bilirubin, total 0.3 11/06/2011  3:40 AM          Chest xray  IMPRESSION:   1. Interval right arm PICC line placement with tip in the superior most SVC.   2. Interval resolution of pulmonary edema/vascular congestion.              Current Inpatient Meds and Allergies     Medications:  Current Facility-Administered Medications    Medication Dose Route Frequency   ??? dexamethasone (DECADRON) 6 mg in 0.9% sodium chloride 50 mL IVPB  6 mg IntraVENous Q6H   ??? phenytoin (DILANTIN) 1,000 mg in 0.9% sodium chloride 250 mL IVPB  1,000 mg IntraVENous NOW   ??? phenytoin (DILANTIN) injection 100 mg  100 mg IntraVENous Q8H   ??? sodium chloride (NS) flush 10-30 mL  10-30 mL InterCATHeter PRN   ??? bacitracin 500 unit/gram packet 1 Packet  1 Packet Topical PRN   ??? perflutren lipid microspheres (DEFINITY) in NS bolus IV  1 mL IntraVENous PRN   ??? furosemide (LASIX) injection 40 mg  40 mg IntraVENous ONCE   ??? carvedilol (COREG) tablet 6.25 mg  6.25 mg Oral BID WITH MEALS   ??? fluticasone (FLONASE) 50 mcg/actuation nasal spray 2 Spray  2 Spray Both Nostrils DAILY   ??? montelukast (SINGULAIR) tablet 10 mg  10 mg Oral QHS   ??? rosuvastatin (CRESTOR) tablet 5 mg  5 mg Oral QHS   ??? traMADol (ULTRAM) tablet 50 mg  50 mg Oral Q6H PRN   ??? sodium chloride (NS) flush 5-10 mL  5-10 mL IntraVENous Q8H   ??? sodium chloride (NS) flush 5-10 mL  5-10 mL IntraVENous PRN   ??? acetaminophen (TYLENOL) tablet 650 mg  650 mg Oral Q4H PRN   ??? HYDROmorphone (PF) (DILAUDID) injection 1 mg  1 mg IntraVENous Q4H PRN   ??? naloxone (NARCAN) injection 0.4 mg  0.4 mg IntraVENous PRN   ??? ondansetron (ZOFRAN) injection 4 mg  4 mg IntraVENous Q4H PRN   ??? ondansetron (ZOFRAN ODT) tablet 4 mg  4 mg Oral Q4H PRN      ROS  Constitutional: Negative for diaphoresis. Positive for weakness   HENT: Negative for congestion.   Respiratory: Negative for cough and shortness  of breath.   Cardiovascular: Negative for chest pain and palpitations.   Gastrointestinal: Negative for diarrhea.   Genitourinary: Negative for flank pain.   Musculoskeletal: Negative for back pain.   Skin: Negative for pallor.   Neurological: Nothing except in H&P - shaking and posturing of the right arm       Objective:       BP 113/66   Pulse 87   Temp 98 ??F (36.7 ??C)   Resp 20   Ht 5\' 10"  (1.778 m)   Wt 89.359 kg (197 lb)   BMI 28.27 kg/m2    SpO2 98%    Intake/Output Summary (Last 24 hours) at 11/06/11 1354  Last data filed at 11/06/11 1330   Gross per 24 hour   Intake    930 ml   Output   2270 ml   Net  -1340 ml       General appearance: Alert, cooperative, no distress, appears stated age   Head: Normocephalic, without obvious abnormality, atraumatic   Neck: Supple, no lymphadenopathy or thyromegaly, trachea midline   Lungs: Clear to auscultation bilaterally   Heart: Regular rate and rhythm, S1, S2 normal, no murmur, click, rub or gallop   Abdomen: Soft, non-tender and not-distended. Bowel sounds normal. No masses, no organomegaly   Extremities: Extremities normal, atraumatic, no cyanosis or edema   Skin: Skin color, texture, turgor normal. No rashes or lesions   Neurologic: Grossly normal and non focal, normal muscle tone and power, cranial nerves are intact, normal sensation  Motor exam normal except for left pronator drift and sensory and cerebellar normal.      Total time spent with chart review, patient examination/education, discussion with staff on case,documentation and medication management / adjustment:  30 Minutes    It is always a pleasure to be involved in the clinical care of this patient.    Roderic Scarce, MD  November 06, 2011  [facility] Internal Medicine  Pager:  [phone]

## 2011-11-06 NOTE — Progress Notes (Addendum)
[facility]  -  Progress Note      Patient: Cathy [name] MRN: [id number]  SSN: [id number]    Date of Birth: 1951/02/17  Age: 61 y.o.  Sex: female      Admit Date: 11/04/2011    I have personally met and evaluated patient. I agree with Iva Boop' note below with following comments:   No signs of vol overload after diuresis yesterday    Plan:  - continue current cardiac regimen with carvedilol only  - suspect that she will need diuretic after surgery  - would also consider spironolactone postOP - apparently ACE-i has induced hypotension    Detrick Dani, MD  [phone]      Assessment:     - Frontal lobe tumor   - Syncopal episode, Likely seizure activity, secondary due to Brain tumor   - H/O Non-ischemic Cardiomyopathy with Last reported LVEF 25% - repeat noted to be 15-20%  - S/P Dual chamber ICD (10/2010)   - Hypothiroid   - Hyperlipidemia   - OSA, not on CPAP       Plan:     Interrogation of device was normal. Compensated.   Continue Coreg.  Will follow post operative.     Subjective:     No new complaints, mild HA. Awaiting surgery Saturday    Objective:      Patient Vitals for the past 8 hrs:   Temp Pulse Resp BP SpO2   11/06/11 0800 - 86  16  119/67 mmHg 100 %   11/06/11 0700 - 91  20  102/72 mmHg 98 %   11/06/11 0600 - 89  16  105/62 mmHg 97 %   11/06/11 0500 - 86  15  110/69 mmHg 94 %   11/06/11 0404 97.8 ??F (36.6 ??C) 86  20  94/54 mmHg 97 %   11/06/11 0300 - 86  14  92/50 mmHg 94 %   11/06/11 0200 - 86  18  104/53 mmHg 94 %         Patient Vitals for the past 96 hrs:   Weight   11/05/11 1102 197 lb (89.359 kg)         Intake/Output Summary (Last 24 hours) at 11/06/11 0953  Last data filed at 11/06/11 0800   Gross per 24 hour   Intake    450 ml   Output   2170 ml   Net  -1720 ml       Physical Exam:  General:  alert, cooperative, no distress, appears stated age  Neck:  nontender, no JVD  Lungs:  clear to auscultation bilaterally  Heart:  regular rate and rhythm, S1, S2 normal, no  murmur, click, rub or gallop  Abdomen:  abdomen is soft without significant tenderness, masses, organomegaly or guarding  Extremities:  extremities normal, atraumatic, no cyanosis or edema    Data Review:     Labs: Results:       Chemistry Recent Labs   [facility] 11/06/11 0340 11/05/11 0415 11/04/11 2140    GLU 165* 124* 172*    NA 141 139 141    K 4.3 4.2 4.6    CL 103 103 105    CO2 27 27 25     BUN 12 12 11     CREA 0.61 0.71 0.59*    CA 8.5 9.0 8.4*    MG 1.8 1.6* 1.8    PHOS -- -- --    AGAP 11 9 11  BUCR 20 17 19     TBIL 0.3 0.5 0.3    GPT 22 24 26     AP 100 104 105    TP 6.7 6.4 6.7    ALB 3.5 3.4 3.4    GLOB 3.2 3.0 3.3    AGRAT 1.1 1.1 1.0      CBC w/Diff Recent Labs   [facility] 11/06/11 0340 11/05/11 0415 11/04/11 2140    WBC 8.5 8.3 6.1    RBC 4.02* 4.02* 4.09*    HGB 12.1 11.9* 12.3    HCT 36.6 36.5 37.3    PLT 203 225 206    GRANS 92* 86* 89*    LYMPH 7* 8* 9*    EOS 0 0 0      Cardiac Enzymes No results found for this basename: CPK, CK, CPKMB, CKRMB, CKMMB, CKMB, RCK3, CKMBT, CKMBPC, CKSMB, CKNDX, CKND1, MYO, TROQR, TROPT, TROIQ, TROIP, TROI, TROPIT, TROPT, TRPOIT, ITNL, TNIPOC, BNP, BNPP, PBNP      Coagulation No results found for this basename: PTP:2,INR:2,APTT:2 in the last 72 hours    Lipid Panel Lab Results   Component Value Date/Time    Cholesterol, total 229 09/02/2011  9:07 AM    HDL Cholesterol 56 09/02/2011  9:07 AM    LDL, calculated 164 09/02/2011  9:07 AM    VLDL, calculated 9 09/02/2011  9:07 AM    Triglyceride 47 09/02/2011  9:07 AM    CHOL/HDL Ratio 3.4 11/23/2008 11:55 AM      BNP Lab Results   Component Value Date/Time    NT pro-BNP 2707 11/05/2011  4:15 AM    NT pro-BNP 678 06/05/2008  1:45 PM      Liver Enzymes Recent Labs   Basename 11/06/11 0340    TP 6.7    ALB 3.5    TBIL 0.3    AP 100    SGOT 11*    GPT 22      Digoxin    Thyroid Studies Lab Results   Component Value Date/Time    TSH 1.590 02/06/2010 11:48 AM

## 2011-11-06 NOTE — Progress Notes (Signed)
Neurosurgery Progress:  Patient seen for Dr. Alphonzo Severance: surgery scheduled on Saturday.  S: Remains alert and denies any headache.  No further seizures  O;  All labs and urinalysis reviewed-no issues. Alert oriented non focal examination.  A:  Doing well  P;  Surgical procedure reviewed, questions answered patient looking forward to surgery and returning to work

## 2011-11-07 LAB — METABOLIC PANEL, COMPREHENSIVE
A-G Ratio: 1.1 (ref 0.8–1.7)
ALT (SGPT): 20 U/L (ref 12.0–78.0)
AST (SGOT): 5 U/L — ABNORMAL LOW (ref 15–37)
Albumin: 3.4 g/dL (ref 3.4–5.0)
Alk. phosphatase: 103 U/L (ref 50–136)
Anion gap: 11 mmol/L (ref 3.0–18)
BUN/Creatinine ratio: 20 (ref 12–20)
BUN: 13 MG/DL (ref 7.0–18)
Bilirubin, total: 0.2 MG/DL (ref 0.2–1.0)
CO2: 28 MMOL/L (ref 21–32)
Calcium: 8.4 MG/DL — ABNORMAL LOW (ref 8.5–10.1)
Chloride: 101 MMOL/L (ref 100–108)
Creatinine: 0.64 MG/DL (ref 0.6–1.3)
GFR est AA: >60 mL/min/{1.73_m2} (ref >60)
GFR est non-AA: >60 mL/min/{1.73_m2} (ref >60)
Globulin: 3.1 g/dL (ref 2.0–4.0)
Glucose: 153 MG/DL — ABNORMAL HIGH (ref 74–99)
Potassium: 3.8 MMOL/L (ref 3.5–5.5)
Protein, total: 6.5 g/dL (ref 6.4–8.2)
Sodium: 140 MMOL/L (ref 136–145)

## 2011-11-07 LAB — CBC WITH AUTOMATED DIFF
ABS. BASOPHILS: 0 10*3/uL (ref 0.0–0.06)
ABS. EOSINOPHILS: 0 10*3/uL (ref 0.0–0.4)
ABS. LYMPHOCYTES: 0.8 10*3/uL — ABNORMAL LOW (ref 0.9–3.6)
ABS. MONOCYTES: 0.5 10*3/uL (ref 0.05–1.2)
ABS. NEUTROPHILS: 11.2 10*3/uL — ABNORMAL HIGH (ref 1.8–8.0)
BASOPHILS: 0 % (ref 0–2)
EOSINOPHILS: 0 % (ref 0–5)
HCT: 35.9 % (ref 35.0–45.0)
HGB: 11.7 g/dL — ABNORMAL LOW (ref 12.0–16.0)
LYMPHOCYTES: 6 % — ABNORMAL LOW (ref 21–52)
MCH: 29.8 PG (ref 24.0–34.0)
MCHC: 32.6 g/dL (ref 31.0–37.0)
MCV: 91.6 FL (ref 74.0–97.0)
MONOCYTES: 4 % (ref 3–10)
MPV: 11.5 FL (ref 9.2–11.8)
NEUTROPHILS: 90 % — ABNORMAL HIGH (ref 40–73)
PLATELET: 200 10*3/uL (ref 135–420)
RBC: 3.92 M/uL — ABNORMAL LOW (ref 4.20–5.30)
RDW: 13.2 % (ref 11.6–14.5)
WBC: 12.5 10*3/uL (ref 4.6–13.2)

## 2011-11-07 LAB — PROTHROMBIN TIME + INR
INR: 1.1 (ref 0.8–1.2)
Prothrombin time: 13.2 s (ref 11.5–15.2)

## 2011-11-07 LAB — PTT: aPTT: 25.2 s (ref 24.6–37.7)

## 2011-11-07 LAB — MAGNESIUM: Magnesium: 1.8 MG/DL (ref 1.8–2.4)

## 2011-11-07 LAB — PHENYTOIN: Phenytoin: 22.6 ug/mL — ABNORMAL HIGH (ref 10.0–20.0)

## 2011-11-07 MED ADMIN — sodium chloride (NS) flush 5-10 mL: INTRAVENOUS | @ 10:00:00 | NDC 87701099893

## 2011-11-07 MED ADMIN — montelukast (SINGULAIR) tablet 10 mg: ORAL | @ 01:00:00 | NDC 00006011701

## 2011-11-07 MED ADMIN — phenytoin (DILANTIN) injection 100 mg: INTRAVENOUS | @ 05:00:00 | NDC 00641049321

## 2011-11-07 MED ADMIN — phenytoin (DILANTIN) injection 100 mg: INTRAVENOUS | @ 13:00:00 | NDC 00641049321

## 2011-11-07 MED ADMIN — temazepam (RESTORIL) capsule 15 mg: ORAL | @ 02:00:00 | NDC 51079041801

## 2011-11-07 MED ADMIN — traMADol (ULTRAM) tablet 50 mg: ORAL | @ 23:00:00 | NDC 62584055911

## 2011-11-07 MED ADMIN — carvedilol (COREG) tablet 6.25 mg: ORAL | @ 13:00:00 | NDC 68084026111

## 2011-11-07 MED ADMIN — dexamethasone (DECADRON) 6 mg in 0.9% sodium chloride 50 mL IVPB: INTRAVENOUS | @ 05:00:00 | NDC 00641036721

## 2011-11-07 MED ADMIN — dexamethasone (DECADRON) 6 mg in 0.9% sodium chloride 50 mL IVPB: INTRAVENOUS | @ 16:00:00 | NDC 00641036721

## 2011-11-07 MED ADMIN — sodium chloride (NS) flush 5-10 mL: INTRAVENOUS | @ 02:00:00 | NDC 87701099893

## 2011-11-07 MED ADMIN — rosuvastatin (CRESTOR) tablet 5 mg: ORAL | @ 02:00:00 | NDC 00310075139

## 2011-11-07 MED ADMIN — dexamethasone (DECADRON) 6 mg in 0.9% sodium chloride 50 mL IVPB: INTRAVENOUS | @ 22:00:00 | NDC 00641036721

## 2011-11-07 MED ADMIN — phenytoin (DILANTIN) injection 100 mg: INTRAVENOUS | @ 22:00:00 | NDC 00641049321

## 2011-11-07 MED ADMIN — sodium chloride (NS) flush 5-10 mL: INTRAVENOUS | @ 18:00:00 | NDC 87701099893

## 2011-11-07 MED ADMIN — traMADol (ULTRAM) tablet 50 mg: ORAL | @ 01:00:00 | NDC 51079099101

## 2011-11-07 MED ADMIN — carvedilol (COREG) tablet 6.25 mg: ORAL | @ 22:00:00 | NDC 68084026111

## 2011-11-07 MED ADMIN — fluticasone (FLONASE) 50 mcg/actuation nasal spray 2 Spray: NASAL | @ 13:00:00 | NDC 50383070016

## 2011-11-07 MED ADMIN — dexamethasone (DECADRON) 6 mg in 0.9% sodium chloride 50 mL IVPB: INTRAVENOUS | @ 10:00:00 | NDC 00641036721

## 2011-11-07 MED FILL — DEXAMETHASONE SODIUM PHOSPHATE 10 MG/ML IJ SOLN: 10 mg/mL | INTRAMUSCULAR | Qty: 0.6

## 2011-11-07 MED FILL — CARVEDILOL 3.125 MG TAB: 3.125 mg | ORAL | Qty: 2

## 2011-11-07 MED FILL — SINGULAIR 10 MG TABLET: 10 mg | ORAL | Qty: 1

## 2011-11-07 MED FILL — CRESTOR 10 MG TABLET: 10 mg | ORAL | Qty: 1

## 2011-11-07 MED FILL — PHENYTOIN SODIUM 50 MG/ML IV: 50 mg/mL | INTRAVENOUS | Qty: 2

## 2011-11-07 MED FILL — TEMAZEPAM 15 MG CAP: 15 mg | ORAL | Qty: 1

## 2011-11-07 MED FILL — TRAMADOL 50 MG TAB: 50 mg | ORAL | Qty: 1

## 2011-11-07 NOTE — Progress Notes (Signed)
INTERNAL MEDICINE  Daily Progress Note    Patient:  Cathy [name]  Date of Admission:  11/04/2011    Interval History and Events of the last 24 hours:   No new complaints       Assessment/Plan:       Patient seen in follow up for multiple medical problems as listed below :    Patient Active Problem List   Diagnosis Code   ??? CHF (congestive heart failure) 428.0   ??? 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 Fibroid 218.9   ??? HTN (hypertension) 401.9   ??? History of elevated lipids V12.29   ??? Gallstones 574.20   ??? Insomnia 780.52   ??? OSA (obstructive sleep apnea) 327.23   ??? Cataract 366.9   ??? Cardiomyopathy 425.4   ??? AICD (automatic cardioverter/defibrillator) present V45.02   ??? Chronic systolic heart failure 428.22   ??? 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 Automatic implantable cardiac defibrillator in situ V45.02   ??? Hypotension, unspecified 458.9   ??? Other and unspecified hyperlipidemia 272.4   ??? AR (allergic rhinitis) 477.9   ??? Brain tumor (benign) 225.0       General:      Code Status:  .Full Code     Plan    Right frontal tumor  presenting with seizure   Patient has been reviewed by neurosurgery. Will continue steroids as per neurosurgery-   Plan for craniotomy tomorrow -Decadron and Dilantin     CHF (congestive heart failure) with AICD and BiV pacer in situ   Patient states that she has an EF of 20% and used to get short of breath but this has improved since BiV and AICD placement.    cardiology seen the pt   - continue current cardiac regimen with carvedilol only ,- would  consider spironolactone postOP      Hypothyroid hx goiter  Will continue regular medications.     HTN (hypertension)  Will continue all regular medications, monitor ans adjust as needed.     OSA (obstructive sleep apnea  Did previously use CPAP but has not used for a long time as she felt congested in them mornings.    Hypomag  Replace per protocol   Disposition and Family:   Recommend to continue hospitalization. Discussed with patient and  nursing staff.    Labwork and Ancillary Studies     All labs/tests/imaging reviewed.Spoke with the nurse regarding patient issues.    Labwork:    CBC w/Diff   Lab Results   Component Value Date/Time    WBC 12.5 11/07/2011  3:15 AM    HGB 11.7 11/07/2011  3:15 AM    HCT 35.9 11/07/2011  3:15 AM    PLATELET 200 11/07/2011  3:15 AM    MCV 91.6 11/07/2011  3:15 AM        Basic Metabolic Profile   Lab Results   Component Value Date/Time    Sodium 140 11/07/2011  3:15 AM    Potassium 3.8 11/07/2011  3:15 AM    Chloride 101 11/07/2011  3:15 AM    CO2 28 11/07/2011  3:15 AM    Anion gap 11 11/07/2011  3:15 AM    Glucose 153 11/07/2011  3:15 AM    BUN 13 11/07/2011  3:15 AM    Creatinine 0.64 11/07/2011  3:15 AM    BUN/Creatinine ratio 20 11/07/2011  3:15 AM    GFR est non-AA >60 11/07/2011  3:15 AM  Calcium 8.4 11/07/2011  3:15 AM    GFR est AA >60 11/07/2011  3:15 AM        Cardiac Enzymes   Lab Results   Component Value Date/Time    CK 41 11/04/2011  8:15 AM    CK - MB <0.5 11/04/2011  8:15 AM    CK-MB Index CANNOT BE CALCULATED 11/04/2011  8:15 AM    Troponin-I, Qt. <0.02 11/04/2011  8:15 AM      Arterial Blood Gases   No results found for this basename: ph,  phi,  pco2,  pco2i,  po2,  po2i,  hco3,  hco3i,  fio2,  fio2i      Coagulation      No results found for this basename: PTTP,  APTT,  PTP,  INR,  INRT      Hepatic Function   Lab Results   Component Value Date/Time    ALT 20 11/07/2011  3:15 AM    AST 5 11/07/2011  3:15 AM    Alk. phosphatase 103 11/07/2011  3:15 AM    Bilirubin, direct 0.1 06/07/2008 10:46 AM    Bilirubin, total 0.2 11/07/2011  3:15 AM          Chest xray  IMPRESSION:   1. Interval right arm PICC line placement with tip in the superior most SVC.   2. Interval resolution of pulmonary edema/vascular congestion.              Current Inpatient Meds and Allergies     Medications:  Current Facility-Administered Medications   Medication Dose Route Frequency   ??? dexamethasone (DECADRON) 6 mg in 0.9% sodium chloride 50 mL IVPB  6  mg IntraVENous Q6H   ??? temazepam (RESTORIL) capsule 15 mg  15 mg Oral ONCE   ??? phenytoin (DILANTIN) injection 100 mg  100 mg IntraVENous Q8H   ??? sodium chloride (NS) flush 10-30 mL  10-30 mL InterCATHeter PRN   ??? bacitracin 500 unit/gram packet 1 Packet  1 Packet Topical PRN   ??? carvedilol (COREG) tablet 6.25 mg  6.25 mg Oral BID WITH MEALS   ??? fluticasone (FLONASE) 50 mcg/actuation nasal spray 2 Spray  2 Spray Both Nostrils DAILY   ??? montelukast (SINGULAIR) tablet 10 mg  10 mg Oral QHS   ??? rosuvastatin (CRESTOR) tablet 5 mg  5 mg Oral QHS   ??? traMADol (ULTRAM) tablet 50 mg  50 mg Oral Q6H PRN   ??? sodium chloride (NS) flush 5-10 mL  5-10 mL IntraVENous Q8H   ??? sodium chloride (NS) flush 5-10 mL  5-10 mL IntraVENous PRN   ??? acetaminophen (TYLENOL) tablet 650 mg  650 mg Oral Q4H PRN   ??? HYDROmorphone (PF) (DILAUDID) injection 1 mg  1 mg IntraVENous Q4H PRN   ??? naloxone (NARCAN) injection 0.4 mg  0.4 mg IntraVENous PRN   ??? ondansetron (ZOFRAN) injection 4 mg  4 mg IntraVENous Q4H PRN   ??? ondansetron (ZOFRAN ODT) tablet 4 mg  4 mg Oral Q4H PRN      ROS  Constitutional: Negative for diaphoresis. Positive for weakness   HENT: Negative for congestion.   Respiratory: Negative for cough and shortness of breath.   Cardiovascular: Negative for chest pain and palpitations.   Gastrointestinal: Negative for diarrhea.   Genitourinary: Negative for flank pain.   Musculoskeletal: Negative for back pain.   Skin: Negative for pallor.   Neurological: Nothing except in H&P - shaking and posturing of the right arm  Objective:       BP 83/52   Pulse 95   Temp 98.3 ??F (36.8 ??C)   Resp 18   Ht 5\' 10"  (1.778 m)   Wt 89.359 kg (197 lb)   BMI 28.27 kg/m2   SpO2 98%    Intake/Output Summary (Last 24 hours) at 11/07/11 1318  Last data filed at 11/07/11 0039   Gross per 24 hour   Intake    990 ml   Output      0 ml   Net    990 ml       General appearance: Alert, cooperative, no distress, appears stated age   Head: Normocephalic, without  obvious abnormality, atraumatic   Neck: Supple, no lymphadenopathy or thyromegaly, trachea midline   Lungs: Clear to auscultation bilaterally   Heart: Regular rate and rhythm, S1, S2 normal, no murmur, click, rub or gallop   Abdomen: Soft, non-tender and not-distended. Bowel sounds normal. No masses, no organomegaly   Extremities: Extremities normal, atraumatic, no cyanosis or edema   Skin: Skin color, texture, turgor normal. No rashes or lesions   Neurologic: Grossly normal and non focal, normal muscle tone and power, cranial nerves are intact, normal sensation  Motor exam normal except for left pronator drift and sensory and cerebellar normal.      Total time spent with chart review, patient examination/education, discussion with staff on case,documentation and medication management / adjustment:  30 Minutes    It is always a pleasure to be involved in the clinical care of this patient.    Roderic Scarce, MD  November 07, 2011  [facility] Internal Medicine  Pager:  [phone]

## 2011-11-07 NOTE — Progress Notes (Signed)
Plan:  To enhance spirituality.  Implementation:  Provided 20 minutes of live bedside harp music, hymns per patient request.  Evaluation:  Patient states thanks for music, smiling.

## 2011-11-07 NOTE — Progress Notes (Signed)
Progress Note      Patient: Cathy [name]               Sex: female          DOA: 11/04/2011         Date of Birth:  05-10-1950      Age:  61 y.o.        LOS:  LOS: 3 days                  Procedure(s) with comments:  CRANIOTOMY FOR TUMOR - right frontal cranoitomy for tumor removal      SUBJECTIVE:  Afebrile  No complaints of pain.  No signs or symptoms of seizures  Cardiology clearance for surgery      OBJECTIVE:  Patient Vitals for the past 4 hrs:   Temp Pulse Resp BP SpO2   11/07/11 1000 - 95  18  83/52 mmHg 98 %   11/07/11 0930 - 110  18  - 99 %   11/07/11 0900 - 84  15  90/49 mmHg 99 %   11/07/11 0830 - 84  22  - 99 %   11/07/11 0800 98.3 ??F (36.8 ??C) 83  22  112/64 mmHg 98 %   11/07/11 0730 - 84  18  - 97 %         Intake/Output Summary (Last 24 hours) at 11/07/11 1032  Last data filed at 11/07/11 0039   Gross per 24 hour   Intake    990 ml   Output    125 ml   Net    865 ml       Physical Exam:   AO x3 PERRLA VFFC EOMI    CNII-XII intact   Motor and sensory exam normal except Left lateral lower leg hypersensative to touch   CV: RRR   Resp: CTA   ABD: NT/ND NABS   Extrem: + pulses no edema   Bilateral 5/5 motor strength    Recent Results (from the past 24 hour(s))   CBC WITH AUTOMATED DIFF    Collection Time    11/07/11  3:15 AM       Component Value Range    WBC 12.5  4.6 - 13.2 K/uL    RBC 3.92 (*) 4.20 - 5.30 M/uL    HGB 11.7 (*) 12.0 - 16.0 g/dL    HCT 16.1  09.6 - 04.5 %    MCV 91.6  74.0 - 97.0 FL    MCH 29.8  24.0 - 34.0 PG    MCHC 32.6  31.0 - 37.0 g/dL    RDW 40.9  81.1 - 91.4 %    PLATELET 200  135 - 420 K/uL    MPV 11.5  9.2 - 11.8 FL    NEUTROPHILS 90 (*) 40 - 73 %    LYMPHOCYTES 6 (*) 21 - 52 %    MONOCYTES 4  3 - 10 %    EOSINOPHILS 0  0 - 5 %    BASOPHILS 0  0 - 2 %    ABS. NEUTROPHILS 11.2 (*) 1.8 - 8.0 K/UL    ABS. LYMPHOCYTES 0.8 (*) 0.9 - 3.6 K/UL    ABS. MONOCYTES 0.5  0.05 - 1.2 K/UL    ABS. EOSINOPHILS 0.0  0.0 - 0.4 K/UL    ABS. BASOPHILS 0.0  0.0 - 0.06 K/UL    DF AUTOMATED      MAGNESIUM    Collection Time  11/07/11  3:15 AM       Component Value Range    Magnesium 1.8  1.8 - 2.4 MG/DL   METABOLIC PANEL, COMPREHENSIVE    Collection Time    11/07/11  3:15 AM       Component Value Range    Sodium 140  136 - 145 MMOL/L    Potassium 3.8  3.5 - 5.5 MMOL/L    Chloride 101  100 - 108 MMOL/L    CO2 28  21 - 32 MMOL/L    Anion gap 11  3.0 - 18 mmol/L    Glucose 153 (*) 74 - 99 MG/DL    BUN 13  7.0 - 18 MG/DL    Creatinine 1.61  0.6 - 1.3 MG/DL    BUN/Creatinine ratio 20  12 - 20      GFR est AA >60  >60 ml/min/1.53m2    GFR est non-AA >60  >60 ml/min/1.41m2    Calcium 8.4 (*) 8.5 - 10.1 MG/DL    Bilirubin, total 0.2  0.2 - 1.0 MG/DL    ALT 20  09.6 - 04.5 U/L    AST 5 (*) 15 - 37 U/L    Alk. phosphatase 103  50 - 136 U/L    Protein, total 6.5  6.4 - 8.2 g/dL    Albumin 3.4  3.4 - 5.0 g/dL    Globulin 3.1  2.0 - 4.0 g/dL    A-G Ratio 1.1  0.8 - 1.7     PROTHROMBIN TIME    Collection Time    11/07/11  3:15 AM       Component Value Range    Prothrombin time 13.2  11.5 - 15.2 sec    INR 1.1  0.8 - 1.2     PTT    Collection Time    11/07/11  3:15 AM       Component Value Range    aPTT 25.2  24.6 - 37.7 SEC   PHENYTOIN    Collection Time    11/07/11  3:15 AM       Component Value Range    Phenytoin 22.6 (*) 10.0 - 20.0 ug/mL   TYPE & SCREEN    Collection Time    11/07/11  3:15 AM       Component Value Range    Crossmatch Expiration 11/10/2011      ABO/Rh(D) O POS      Antibody screen NEG             Current Facility-Administered Medications   Medication Dose Route Frequency   ??? dexamethasone (DECADRON) 6 mg in 0.9% sodium chloride 50 mL IVPB  6 mg IntraVENous Q6H   ??? temazepam (RESTORIL) capsule 15 mg  15 mg Oral ONCE   ??? phenytoin (DILANTIN) injection 100 mg  100 mg IntraVENous Q8H   ??? sodium chloride (NS) flush 10-30 mL  10-30 mL InterCATHeter PRN   ??? bacitracin 500 unit/gram packet 1 Packet  1 Packet Topical PRN   ??? carvedilol (COREG) tablet 6.25 mg  6.25 mg Oral BID WITH MEALS   ??? fluticasone (FLONASE)  50 mcg/actuation nasal spray 2 Spray  2 Spray Both Nostrils DAILY   ??? montelukast (SINGULAIR) tablet 10 mg  10 mg Oral QHS   ??? rosuvastatin (CRESTOR) tablet 5 mg  5 mg Oral QHS   ??? traMADol (ULTRAM) tablet 50 mg  50 mg Oral Q6H PRN   ??? sodium chloride (NS) flush 5-10 mL  5-10 mL IntraVENous  Q8H   ??? sodium chloride (NS) flush 5-10 mL  5-10 mL IntraVENous PRN   ??? acetaminophen (TYLENOL) tablet 650 mg  650 mg Oral Q4H PRN   ??? HYDROmorphone (PF) (DILAUDID) injection 1 mg  1 mg IntraVENous Q4H PRN   ??? naloxone (NARCAN) injection 0.4 mg  0.4 mg IntraVENous PRN   ??? ondansetron (ZOFRAN) injection 4 mg  4 mg IntraVENous Q4H PRN   ??? ondansetron (ZOFRAN ODT) tablet 4 mg  4 mg Oral Q4H PRN           ASSESSMENT:  Patient Active Problem List   Diagnosis Code   ??? CHF (congestive heart failure) 428.0   ??? 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 Fibroid 218.9   ??? HTN (hypertension) 401.9   ??? History of elevated lipids V12.29   ??? Gallstones 574.20   ??? Insomnia 780.52   ??? OSA (obstructive sleep apnea) 327.23   ??? Cataract 366.9   ??? Cardiomyopathy 425.4   ??? AICD (automatic cardioverter/defibrillator) present V45.02   ??? Chronic systolic heart failure 428.22   ??? 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 Automatic implantable cardiac defibrillator in situ V45.02   ??? Hypotension, unspecified 458.9   ??? Other and unspecified hyperlipidemia 272.4   ??? AR (allergic rhinitis) 477.9   ??? Brain tumor (benign) 225.0         PLAN: NPO after midnight for Crani/tumor resection in am.        Assessment and plan made with Dr.  Earlene Plater, NP  11/07/2011  10:32 AM

## 2011-11-07 NOTE — Progress Notes (Signed)
S: no complaints  O: afeb, Neuro intact  Risks and benefits of proposed right frontal craniotomy for tumor resection explained to patient and husband, including the possibility and risks of transfusion, and they understand and agree to proceed.   A: right frontal tumor  P: To OR tomorrow.

## 2011-11-07 NOTE — Other (Signed)
GLYCEMIC CONTROL SCREENING INITIATED:    No results found for this basename: HBA1C, HGBE8, HA1CLT      Lab Results   Component Value Date/Time    Glucose 153 11/07/2011  3:15 AM       Patient with elevated fasting blood sugars and on decadron. Consider Accu-checks, HbA1c and correctional coverage per IP insulin protocol.      [x]   Recommend corrective insulin per IP Insulin order set.      [x]   Standard insulin scale      [x]   Recommend blood glucose monitoring while patient is on steroids.    Mardene Celeste, MS RD  Clinical Dietitian; [facility]  Office: [phone] Pager: [phone]

## 2011-11-07 NOTE — Progress Notes (Signed)
[facility]  -  Progress Note      Patient: Cathy [name] MRN: [id number]  SSN: [id number]    Date of Birth: 03-May-1950  Age: 61 y.o.  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  Unknown Yes    Overview    Signed 05/31/2011 11:24 AM by Javier Docker     Non ischemic, pt was taken off coreg by pmd, possibly related to side effect and low bp  Continue restart of coreg as pt has ef of 20%  i have discussed risk and benefit of icd implant with pt, she is agreeable now  Pt off lisinopril due to lop bp, she does not want to try it again  Refer to dr seutter for icd            AICD (automatic cardioverter/defibrillator) present V45.02  11/15/2010 Yes    Overview    Signed 11/15/2010  2:40 PM by Arletha Pili, MD     Implanted dual chamber medtronic AICD 11/15/10          OSA (obstructive sleep apnea) 327.23  11/13/2008 Yes        CHF (congestive heart failure) 428.0  06/23/2008 Yes        Hypothyroid hx goiter 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 Will check on her post surgery. Resume cardiac medication regimen as hemodynamics  tolerate.    Subjective:     No new complaints.     Objective:      Patient Vitals for the past 8 hrs:   Temp Pulse Resp BP SpO2   11/07/11 1000 - 95  18  83/52 mmHg 98 %   11/07/11 0930 - 110  18  - 99 %   11/07/11 0900 - 84  15  90/49 mmHg 99 %   11/07/11 0830 - 84  22  - 99 %   11/07/11 0800 98.3 ??F (36.8 ??C) 83  22  112/64 mmHg 98 %   11/07/11 0730 - 84  18  - 97 %   11/07/11 0500 - 84  19  - 99 %   11/07/11 0400 99 ??F (37.2 ??C) 91  23  135/82 mmHg 100 %  Patient Vitals for the past 96 hrs:   Weight   11/05/11 1102 197 lb (89.359 kg)         Intake/Output Summary (Last 24 hours) at 11/07/11 1156  Last data filed at 11/07/11 0039   Gross per 24 hour   Intake    990 ml   Output      0 ml   Net    990 ml       Physical Exam:  General: alert, cooperative, no distress, appears stated age   Neck: nontender, no JVD   Lungs: clear to auscultation bilaterally   Heart: regular rate and rhythm, S1, S2 normal, no murmur, click, rub or gallop   Abdomen: abdomen is soft without significant tenderness, masses, organomegaly or guarding   Extremities: extremities normal, atraumatic, no cyanosis or edema    Data Review:     Labs: Results:       Chemistry Recent Labs   [facility] 11/07/11 0315 11/06/11 0340 11/05/11 0415    GLU 153* 165* 124*    NA 140 141 139    K 3.8 4.3 4.2    CL 101 103 103    CO2 28 27 27     BUN 13 12 12     CREA 0.64 0.61 0.71    CA 8.4* 8.5 9.0    MG 1.8 1.8 1.6*    PHOS -- -- --    AGAP 11 11 9     BUCR 20 20 17     TBIL 0.2 0.3 0.5    GPT 20 22 24     AP 103 100 104    TP 6.5 6.7 6.4    ALB 3.4 3.5 3.4    GLOB 3.1 3.2 3.0    AGRAT 1.1 1.1 1.1      CBC w/Diff Recent Labs   [facility] 11/07/11 0315 11/06/11 0340 11/05/11 0415    WBC 12.5 8.5 8.3    RBC 3.92* 4.02* 4.02*    HGB 11.7* 12.1 11.9*    HCT 35.9 36.6 36.5    PLT 200 203 225    GRANS 90* 92* 86*    LYMPH 6* 7* 8*    EOS 0 0 0      Cardiac Enzymes No results found for this basename: CPK, CK, CPKMB, CKRMB, CKMMB, CKMB, RCK3, CKMBT, CKMBPC, CKSMB,  CKNDX, CKND1, MYO, TROQR, TROPT, TROIQ, TROIP, TROI, TROPIT, TROPT, TRPOIT, ITNL, TNIPOC, BNP, BNPP, PBNP      Coagulation Recent Labs   [facility] 11/07/11 0315    PTP 13.2    INR 1.1    APTT 25.2       Lipid Panel Lab Results   Component Value Date/Time    Cholesterol, total 229 09/02/2011  9:07 AM    HDL Cholesterol 56 09/02/2011  9:07 AM    LDL, calculated 164 09/02/2011  9:07 AM    VLDL, calculated 9 09/02/2011  9:07 AM    Triglyceride 47 09/02/2011  9:07 AM    CHOL/HDL Ratio 3.4 11/23/2008 11:55 AM      BNP Lab Results   Component Value Date/Time    NT pro-BNP 2707 11/05/2011  4:15 AM    NT pro-BNP 678 06/05/2008  1:45 PM      Liver Enzymes Recent Labs   [facility] 11/07/11 0315    TP 6.5    ALB 3.4    TBIL 0.2    AP 103    SGOT 5*    GPT 20  Digoxin    Thyroid Studies Lab Results   Component Value Date/Time    TSH 1.590 02/06/2010 11:48 AM

## 2011-11-08 LAB — CBC WITH AUTOMATED DIFF
ABS. BASOPHILS: 0 10*3/uL (ref 0.0–0.06)
ABS. EOSINOPHILS: 0 10*3/uL (ref 0.0–0.4)
ABS. LYMPHOCYTES: 0.8 10*3/uL — ABNORMAL LOW (ref 0.9–3.6)
ABS. MONOCYTES: 0.5 10*3/uL (ref 0.05–1.2)
ABS. NEUTROPHILS: 10.8 10*3/uL — ABNORMAL HIGH (ref 1.8–8.0)
BASOPHILS: 0 % (ref 0–2)
EOSINOPHILS: 0 % (ref 0–5)
HCT: 36.1 % (ref 35.0–45.0)
HGB: 11.9 g/dL — ABNORMAL LOW (ref 12.0–16.0)
LYMPHOCYTES: 7 % — ABNORMAL LOW (ref 21–52)
MCH: 30.1 PG (ref 24.0–34.0)
MCHC: 33 g/dL (ref 31.0–37.0)
MCV: 91.4 FL (ref 74.0–97.0)
MONOCYTES: 4 % (ref 3–10)
MPV: 11.4 FL (ref 9.2–11.8)
NEUTROPHILS: 89 % — ABNORMAL HIGH (ref 40–73)
PLATELET: 201 10*3/uL (ref 135–420)
RBC: 3.95 M/uL — ABNORMAL LOW (ref 4.20–5.30)
RDW: 13 % (ref 11.6–14.5)
WBC: 12.1 10*3/uL (ref 4.6–13.2)

## 2011-11-08 LAB — METABOLIC PANEL, COMPREHENSIVE
A-G Ratio: 1.1 (ref 0.8–1.7)
ALT (SGPT): 43 U/L (ref 12.0–78.0)
AST (SGOT): 32 U/L (ref 15–37)
Albumin: 3.2 g/dL — ABNORMAL LOW (ref 3.4–5.0)
Alk. phosphatase: 100 U/L (ref 50–136)
Anion gap: 13 mmol/L (ref 3.0–18)
BUN/Creatinine ratio: 21 — ABNORMAL HIGH (ref 12–20)
BUN: 16 MG/DL (ref 7.0–18)
Bilirubin, total: 0.2 MG/DL (ref 0.2–1.0)
CO2: 26 MMOL/L (ref 21–32)
Calcium: 8.3 MG/DL — ABNORMAL LOW (ref 8.5–10.1)
Chloride: 103 MMOL/L (ref 100–108)
Creatinine: 0.75 MG/DL (ref 0.6–1.3)
GFR est AA: >60 mL/min/{1.73_m2} (ref >60)
GFR est non-AA: >60 mL/min/{1.73_m2} (ref >60)
Globulin: 3 g/dL (ref 2.0–4.0)
Glucose: 135 MG/DL — ABNORMAL HIGH (ref 74–99)
Potassium: 4.2 MMOL/L (ref 3.5–5.5)
Protein, total: 6.2 g/dL — ABNORMAL LOW (ref 6.4–8.2)
Sodium: 142 MMOL/L (ref 136–145)

## 2011-11-08 LAB — MAGNESIUM: Magnesium: 1.8 MG/DL (ref 1.8–2.4)

## 2011-11-08 LAB — PHENYTOIN: Phenytoin: 23.4 ug/mL — ABNORMAL HIGH (ref 10.0–20.0)

## 2011-11-08 MED ADMIN — phenytoin (DILANTIN) injection 100 mg: INTRAVENOUS | @ 05:00:00 | NDC 00641049321

## 2011-11-08 MED ADMIN — rosuvastatin (CRESTOR) tablet 5 mg: ORAL | @ 01:00:00 | NDC 00310075139

## 2011-11-08 MED ADMIN — sodium chloride (NS) flush 5-10 mL: INTRAVENOUS | @ 01:00:00 | NDC 87701099893

## 2011-11-08 MED ADMIN — clindamycin phosphate 900 mg in 0.9% sodium chloride (MBP/ADV) 100 mL ADV: INTRAVENOUS | @ 21:00:00 | NDC 00409710167

## 2011-11-08 MED ADMIN — sodium chloride (NS) flush 5-10 mL: INTRAVENOUS | @ 18:00:00 | NDC 87701099893

## 2011-11-08 MED ADMIN — clindamycin phosphate 900 mg in 0.9% sodium chloride (MBP/ADV) 100 mL ADV: INTRAVENOUS | @ 12:00:00 | NDC 00409710167

## 2011-11-08 MED ADMIN — thrombin (bovine) (THROMBIN-JMI) 5,000 unit topical solution: @ 13:00:00 | NDC 60793070505

## 2011-11-08 MED ADMIN — sodium chloride irrigation 0.9 % 100 mL Irrigation: @ 14:00:00 | NDC 00409613803

## 2011-11-08 MED ADMIN — dexamethasone (DECADRON) 6 mg in 0.9% sodium chloride 50 mL IVPB: INTRAVENOUS | @ 12:00:00 | NDC 00641036721

## 2011-11-08 MED ADMIN — dexamethasone (DECADRON) 6 mg in 0.9% sodium chloride 50 mL IVPB: INTRAVENOUS | @ 10:00:00 | NDC 00641036721

## 2011-11-08 MED ADMIN — sodium chloride (NS) flush 5-10 mL: INTRAVENOUS | @ 10:00:00 | NDC 87701099893

## 2011-11-08 MED ADMIN — HYDROmorphone (PF) (DILAUDID) injection 1 mg: INTRAVENOUS | @ 17:00:00 | NDC 00409128331

## 2011-11-08 MED ADMIN — lidocaine-EPINEPHrine (XYLOCAINE) 1 %-1:100,000 injection: SUBCUTANEOUS | @ 13:00:00 | NDC 00409317801

## 2011-11-08 MED ADMIN — montelukast (SINGULAIR) tablet 10 mg: ORAL | @ 01:00:00 | NDC 68084062011

## 2011-11-08 MED ADMIN — carvedilol (COREG) tablet 6.25 mg: ORAL | @ 23:00:00 | NDC 68084026111

## 2011-11-08 MED ADMIN — dexamethasone (DECADRON) 4 mg/mL injection 4 mg: INTRAVENOUS | @ 23:00:00 | NDC 63323016501

## 2011-11-08 MED ADMIN — HYDROmorphone (PF) (DILAUDID) injection 1 mg: INTRAVENOUS | @ 01:00:00 | NDC 00409128331

## 2011-11-08 MED ADMIN — phenytoin (DILANTIN) injection 100 mg: INTRAVENOUS | @ 23:00:00 | NDC 00641049321

## 2011-11-08 MED ADMIN — HYDROmorphone (PF) (DILAUDID) injection 1 mg: INTRAVENOUS | NDC 00409128331

## 2011-11-08 MED ADMIN — dexamethasone (DECADRON) 6 mg in 0.9% sodium chloride 50 mL IVPB: INTRAVENOUS | @ 04:00:00 | NDC 00641036721

## 2011-11-08 MED ADMIN — bacitracin 150,000 Units in sodium chloride irrigation 0.9 % 3,000 mL Irrigation: @ 13:00:00 | NDC 00409713809

## 2011-11-08 MED FILL — DEXAMETHASONE SODIUM PHOSPHATE 10 MG/ML IJ SOLN: 10 mg/mL | INTRAMUSCULAR | Qty: 0.6

## 2011-11-08 MED FILL — HYDROMORPHONE (PF) 1 MG/ML IJ SOLN: 1 mg/mL | INTRAMUSCULAR | Qty: 1

## 2011-11-08 MED FILL — DEXAMETHASONE SODIUM PHOSPHATE 4 MG/ML IJ SOLN: 4 mg/mL | INTRAMUSCULAR | Qty: 1

## 2011-11-08 MED FILL — BACITRACIN 500 UNIT/G OINTMENT: 500 unit/gram | CUTANEOUS | Qty: 15

## 2011-11-08 MED FILL — CLINDAMYCIN 900 MG/6 ML IV: 900 mg/6 mL | INTRAVENOUS | Qty: 6

## 2011-11-08 MED FILL — SINGULAIR 10 MG TABLET: 10 mg | ORAL | Qty: 1

## 2011-11-08 MED FILL — BACITRACIN 50,000 UNIT IM: 50000 unit | INTRAMUSCULAR | Qty: 50000

## 2011-11-08 MED FILL — SODIUM CHLORIDE 0.9 % INJECTION: INTRAMUSCULAR | Qty: 10

## 2011-11-08 MED FILL — CRESTOR 10 MG TABLET: 10 mg | ORAL | Qty: 1

## 2011-11-08 MED FILL — PHENYTOIN SODIUM 50 MG/ML IV: 50 mg/mL | INTRAVENOUS | Qty: 2

## 2011-11-08 MED FILL — FENTANYL CITRATE (PF) 50 MCG/ML IJ SOLN: 50 mcg/mL | INTRAMUSCULAR | Qty: 5

## 2011-11-08 MED FILL — SODIUM CHLORIDE 0.9 % INJECTION: INTRAMUSCULAR | Qty: 30

## 2011-11-08 MED FILL — BACITRACIN 50,000 UNIT IM: 50000 unit | INTRAMUSCULAR | Qty: 150000

## 2011-11-08 MED FILL — CARVEDILOL 3.125 MG TAB: 3.125 mg | ORAL | Qty: 2

## 2011-11-08 NOTE — Other (Signed)
14 PATIENT WAS TRANSFERRED FROM 2605 VIA BED WITH CARDIAC MONITOR,AWAKE AND ORIENTED X3 BY Loney Domingo RN AND KEVIN FELTY SURGICAL TECH.PATIENT ARRIVE TO THE OR PACU HOLDING AREA  @0650 .D.Elara Cocke R.N.

## 2011-11-08 NOTE — Progress Notes (Signed)
11/08/2011  7:00 AM  Shift summary:  Uneventful night. VS WNL. Afebrile. No changes in neuro status. PT AOx4 and ambulates with out gait disturbance. Pt left for OR before 0700.

## 2011-11-08 NOTE — Brief Op Note (Signed)
BRIEF OPERATIVE NOTE    Date of Procedure: 11/08/2011   Preoperative Diagnosis: 237.5  brain tumor  Postoperative Diagnosis: 237.5  brain tumor    Procedure: Procedure(s):  right frontal cranoitomy for tumor removal  Surgeon(s) and Role:     * Augustin Schooling, MD - Primary  Anesthesia: Regional   Estimated Blood Loss: 200  Specimens:   ID Type Source Tests Collected by Time Destination   1 : dura Preservative Brain  Augustin Schooling, MD 11/08/2011 0930 Pathology   2 : brain tumor Frozen Section Brain  Augustin Schooling, MD 11/08/2011 0933 Pathology   3 : brain tumor  Preservative Brain  Augustin Schooling, MD 11/08/2011 1025 Pathology      Findings: large meningioma   Complications: none  Implants:   Implant Name Type Inv. Item Serial No. Manufacturer Lot No. LRB No. Used Action   CLAMP CRAN CRNOFX 2 TI - [id number]  CLAMP CRAN CRNOFX 2 TI  [employer] [id number] Right 2 Implanted   CLAMP CRAN CRNOFX 2 TI - [id number]   CLAMP CRAN CRNOFX 2 TI   [employer] [id number] Right 2 Implanted

## 2011-11-08 NOTE — Op Note (Signed)
[facility]                  [address] [place], [place]  [address]                                 OPERATIVE REPORT    PATIENT:     Cathy [name], Cathy [name]  MRN              [id number]   DATE:       11/08/2011  BILLING:         [id number]  LOCATION:  ATTENDING:   Bunnie Philips PFEFFER, MD  SURGEON:     Augustin Schooling, MD      PREOPERATIVE DIAGNOSIS: Right frontal tumor.    POSTOPERATIVE DIAGNOSIS: Right frontal meningioma.    PROCEDURES PERFORMED: A right frontal craniotomy with gross total resection  of parasagittal meningioma.    ESTIMATED BLOOD LOSS: 200 mL.    SPECIMENS REMOVED: Brain tumor, meningioma.    ATTENDING: Augustin Schooling    ANESTHESIA: General endotracheal.    COMPLICATIONS: None.    INDICATIONS: This 61 year old female presented with a generalized seizure  and altered mental status. A MRI scan showed a 6 x 6 x 5 cm right frontal  brain tumor with marked edema and after suitable preparation with  intravenous steroids, she was brought to the operating room for its  removal.    DESCRIPTION OF PROCEDURE: After the smooth induction of general anesthesia,  a Foley catheter, Kendall stockings, and arterial line were placed and the  [place] 3-point pin headrest was applied to the skull after the entire  head been shaved and prepped. She was then placed in the supine position  with her head in flexion and the bifrontal region was then further shaved,  prepped, and draped in a standard fashion.    A bicoronal incision was made starting at the right zygoma extending up  across the midline to just below the left superior temporal line. The  incision was carried down through the subcutaneous tissues and galea and  skin edge bleeding was controlled with Raney clips. With sharp dissection,  the scalp was dissected and reflected anteriorly and held with towel clips.  A large pericranial graft was taken for later dural grafting. Then  utilizing the Midas Rex  drill, 4 burr holes were placed in the midline with  the most posterior being 2 cm behind the coronal suture and then 2  additional burr holes at the superior temporal line and anteriorly and  posteriorly. The craniotomy flap size was approximately 8 x 6 cm. The dura  was cleared from beneath the burr holes and then the burr holes were  connected with the Midas Rex drill, with a B1 footplate attachment and the  bone plate was removed and central tack-up holes were placed. This was then  placed in antibiotic solution. Then circumferential wire pass holes were  placed and 4-0 Nurolon tack-up sutures were placed and held with hemostats.  Then the dura was opened in a U-shape fashion based medially and reflected  medially and the tumor was immediately obvious, which was very adherent to  the dura consistent with meningioma. This dissection then proceeded  medially until we got around the tumor and then the dura, since it was  invaded with tumor, was excised and sent to pathology. Then the pia  arachnoid of the brain surrounding  the tumor edges was coagulated and  incised and an obvious tumor plane was demonstrated. The tumor was then  internally debulked with a combination of Bovie cautery loops, as well as  the ultrasonic aspirator. The edges were collapsed down and the brain  itself was protected with serial half by 3 patties and large chunks of the  tumor were removed. Frozen section had been sent initially and returned a  diagnosis of meningioma. Circumferential dissection then proceeded and  there were several very large chunks of calcium and these portions of the  tumor were then excised with a Metzenbaum scissor and the lateral  three-quarters of the tumor was removed and the brain beneath this was  preserved. Then the medial dissection proceeded and the remaining tumor  attachment along the undersurface of the lateral leaves of the sagittal  sinus was accomplished with the bipolar cautery and the microscissors.   Ultimately a gross total resection of the tumor was accomplished. The  tumor, as stated, was 6 x 6 x 5 cm. Then meticulous hemostasis was achieved  in the bed of the cortical resection and the raw cortical surfaces were  lined with Surgicel and meticulous hemostasis was observed. The pericranial  patch graft was then found to be of appropriate size and this was  circumferentially closed with running and interrupted 4-0 Nurolon sutures  with a watertight closure. A central dural tack-up was placed and brought  out through the wire pass holes in the bone plate, which was reaffixed and  then held with CranioFix fixators. The wound was irrigated with antibiotic  solution and hemostasis was achieved with the bipolar cautery. Then the  galea was reapproximated with interrupted 0 Vicryl, subcutaneous tissues  with 2-0 Vicryl, and the skin was approximated with staples. Counts were  correct. Blood loss was 200 mL. Wound was dressed with Xeroform, dry gauze  and circumferential Kling and Kerlix wrap. Patient awoke and was extubated  and was conversant and following commands in all 4 extremities and  transferred to Intensive Care in stable condition.                      Augustin Schooling, MD    JPP:wmx  D: 11/08/2011 12:05 P T: 11/08/2011 03:55 P  Job#:  [id number]  CScriptDoc #:  [id number]  cc:   NOEL Brynda Peon, MD        Augustin Schooling, MD        Bunnie Philips PFEFFER, MD

## 2011-11-08 NOTE — Progress Notes (Addendum)
INTERNAL MEDICINE  Daily Progress Note    Patient:  Cathy [name]  Date of Admission:  11/04/2011    Interval History and Events of the last 24 hours:  Post op seen       Assessment/Plan:       Patient seen in follow up for multiple medical problems as listed below :    Patient Active Problem List   Diagnosis Code   ??? CHF (congestive heart failure) 428.0   ??? 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 Fibroid 218.9   ??? HTN (hypertension) 401.9   ??? History of elevated lipids V12.29   ??? Gallstones 574.20   ??? Insomnia 780.52   ??? OSA (obstructive sleep apnea) 327.23   ??? Cataract 366.9   ??? Cardiomyopathy 425.4   ??? AICD (automatic cardioverter/defibrillator) present V45.02   ??? Chronic systolic heart failure 428.22   ??? 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 Automatic implantable cardiac defibrillator in situ V45.02   ??? Hypotension, unspecified 458.9   ??? Other and unspecified hyperlipidemia 272.4   ??? AR (allergic rhinitis) 477.9   ??? Brain tumor (benign) 225.0       General:      Code Status:  .Full Code     Plan    Right frontal tumor  presenting with seizure   Patient has been reviewed by neurosurgery. Will continue steroids/Dialntin  as per neurosurgery      CHF (congestive heart failure) with AICD and BiV pacer in situ   Patient states that she has an EF of 20% and used to get short of breath but this has improved since BiV and AICD placement.    cardiology seen the pt   - continue current cardiac regimen with carvedilol only ,- would  consider spironolactone postOP      Hypothyroid hx goiter  Will continue regular medications.     HTN (hypertension)  Will continue all regular medications, monitor ans adjust as needed.     OSA (obstructive sleep apnea  Did previously use CPAP but has not used for a long time as she felt congested in them mornings.    Hypomag  Replace per protocol   Disposition and Family:   Recommend to continue hospitalization. Discussed with patient and nursing staff.    Labwork and Ancillary Studies      All labs/tests/imaging reviewed.Spoke with the nurse regarding patient issues.    Labwork:    CBC w/Diff   Lab Results   Component Value Date/Time    WBC 12.1 11/08/2011  4:00 AM    HGB 11.9 11/08/2011  4:00 AM    HCT 36.1 11/08/2011  4:00 AM    PLATELET 201 11/08/2011  4:00 AM    MCV 91.4 11/08/2011  4:00 AM        Basic Metabolic Profile   Lab Results   Component Value Date/Time    Sodium 142 11/08/2011  4:00 AM    Potassium 4.2 11/08/2011  4:00 AM    Chloride 103 11/08/2011  4:00 AM    CO2 26 11/08/2011  4:00 AM    Anion gap 13 11/08/2011  4:00 AM    Glucose 135 11/08/2011  4:00 AM    BUN 16 11/08/2011  4:00 AM    Creatinine 0.75 11/08/2011  4:00 AM    BUN/Creatinine ratio 21 11/08/2011  4:00 AM    GFR est non-AA >60 11/08/2011  4:00 AM    Calcium 8.3 11/08/2011  4:00 AM  GFR est AA >60 11/08/2011  4:00 AM        Cardiac Enzymes   Lab Results   Component Value Date/Time    CK 41 11/04/2011  8:15 AM    CK - MB <0.5 11/04/2011  8:15 AM    CK-MB Index CANNOT BE CALCULATED 11/04/2011  8:15 AM    Troponin-I, Qt. <0.02 11/04/2011  8:15 AM      Arterial Blood Gases   No results found for this basename: ph,  phi,  pco2,  pco2i,  po2,  po2i,  hco3,  hco3i,  fio2,  fio2i      Coagulation      No results found for this basename: PTTP,  APTT,  PTP,  INR,  INRT      Hepatic Function   Lab Results   Component Value Date/Time    ALT 43 11/08/2011  4:00 AM    AST 32 11/08/2011  4:00 AM    Alk. phosphatase 100 11/08/2011  4:00 AM    Bilirubin, direct 0.1 06/07/2008 10:46 AM    Bilirubin, total 0.2 11/08/2011  4:00 AM          Chest xray  IMPRESSION:   1. Interval right arm PICC line placement with tip in the superior most SVC.   2. Interval resolution of pulmonary edema/vascular congestion.              Current Inpatient Meds and Allergies     Medications:  Current Facility-Administered Medications   Medication Dose Route Frequency   ??? dexamethasone (DECADRON) 4 mg/mL injection 4 mg  4 mg IntraVENous Q6H   ??? clindamycin phosphate 900 mg in 0.9% sodium  chloride (MBP/ADV) 100 mL ADV  900 mg IntraVENous Q8H   ??? phenytoin (DILANTIN) injection 100 mg  100 mg IntraVENous Q8H   ??? sodium chloride (NS) flush 10-30 mL  10-30 mL InterCATHeter PRN   ??? bacitracin 500 unit/gram packet 1 Packet  1 Packet Topical PRN   ??? carvedilol (COREG) tablet 6.25 mg  6.25 mg Oral BID WITH MEALS   ??? fluticasone (FLONASE) 50 mcg/actuation nasal spray 2 Spray  2 Spray Both Nostrils DAILY   ??? montelukast (SINGULAIR) tablet 10 mg  10 mg Oral QHS   ??? rosuvastatin (CRESTOR) tablet 5 mg  5 mg Oral QHS   ??? traMADol (ULTRAM) tablet 50 mg  50 mg Oral Q6H PRN   ??? sodium chloride (NS) flush 5-10 mL  5-10 mL IntraVENous Q8H   ??? sodium chloride (NS) flush 5-10 mL  5-10 mL IntraVENous PRN   ??? acetaminophen (TYLENOL) tablet 650 mg  650 mg Oral Q4H PRN   ??? HYDROmorphone (PF) (DILAUDID) injection 1 mg  1 mg IntraVENous Q4H PRN   ??? naloxone (NARCAN) injection 0.4 mg  0.4 mg IntraVENous PRN   ??? ondansetron (ZOFRAN) injection 4 mg  4 mg IntraVENous Q4H PRN   ??? ondansetron (ZOFRAN ODT) tablet 4 mg  4 mg Oral Q4H PRN      ROS  Constitutional: Negative for diaphoresis. Positive for weakness   HENT: Negative for congestion.   Respiratory: Negative for cough and shortness of breath.   Cardiovascular: Negative for chest pain and palpitations.   Gastrointestinal: Negative for diarrhea.   Genitourinary: Negative for flank pain.   Musculoskeletal: Negative for back pain.   Skin: Negative for pallor.   Neurological: Nothing except in H&P - shaking and posturing of the right arm       Objective:  BP 131/65   Pulse 85   Temp 98.2 ??F (36.8 ??C)   Resp 18   Ht 5\' 10"  (1.778 m)   Wt 89.359 kg (197 lb)   BMI 28.27 kg/m2   SpO2 100%    Intake/Output Summary (Last 24 hours) at 11/08/11 1311  Last data filed at 11/08/11 0549   Gross per 24 hour   Intake    710 ml   Output    950 ml   Net   -240 ml       General appearance: Sedated after surgery on anesthesia   Head: Normocephalic, without obvious abnormality, atraumatic    Neck: Supple, no lymphadenopathy or thyromegaly, trachea midline   Lungs: Clear to auscultation bilaterally   Heart: Regular rate and rhythm, S1, S2 normal, no murmur, click, rub or gallop   Abdomen: Soft, non-tender and not-distended. Bowel sounds normal. No masses, no organomegaly   Extremities: Extremities normal, atraumatic, no cyanosis or edema   Skin: Skin color, texture, turgor normal. No rashes or lesions   Neurologic: cant assess as sedated       Total time spent with chart review, patient examination/education, discussion with staff on case,documentation and medication management / adjustment:  20 Minutes    It is always a pleasure to be involved in the clinical care of this patient.    Roderic Scarce, MD  November 08, 2011  [facility] Internal Medicine  Pager:  [phone]

## 2011-11-08 NOTE — Progress Notes (Signed)
Post-Anesthesia Evaluation & Assessment    Visit Vitals   Item Reading   ??? BP 119/58   ??? Pulse 91   ??? Temp 98.2 ??F (36.8 ??C)   ??? Resp 18   ??? Ht 5\' 10"  (1.778 m)   ??? Wt 89.359 kg (197 lb)   ??? BMI 28.27 kg/m2   ??? SpO2 100%       Nausea/Vomiting: no nausea    Pain score (VAS): 2    Post-operative hydration adequate.    Mental status & Level of consciousness: orientation per pre-anesthetic level    Neurological status: moves all extremities, sensation grossly intact    Pulmonary status: airway patent, no supplemental oxygen required    Complications related to anesthesia: none    Additional comments:        Estanislado Pandy, CRNA  November 08, 2011

## 2011-11-08 NOTE — Op Note (Signed)
[facility]                  [address] [place], [place]  [address]                                 OPERATIVE REPORT    PATIENT:     Cathy [name], Cathy [name]  MRN              [id number]   DATE:       11/08/2011  BILLING:         [id number]  LOCATION:  ATTENDING:   Bunnie Philips PFEFFER, MD  SURGEON:     Augustin Schooling, MD      PREOPERATIVE DIAGNOSIS: Right frontal tumor.    POSTOPERATIVE DIAGNOSIS: Right frontal meningioma.    PROCEDURES PERFORMED: A right frontal craniotomy with gross total resection  of parasagittal meningioma.    ESTIMATED BLOOD LOSS: 200 mL.    SPECIMENS REMOVED: Brain tumor, meningioma.    ATTENDING: Augustin Schooling    ANESTHESIA: General endotracheal.    COMPLICATIONS: None.    INDICATIONS: This 61 year old female presented with a generalized seizure  and altered mental status. A MRI scan showed a 6 x 6 x 5 cm right frontal  brain tumor with marked edema and after suitable preparation with  intravenous steroids, she was brought to the operating room for its  removal.    DESCRIPTION OF PROCEDURE: After the smooth induction of general anesthesia,  a Foley catheter, Kendall stockings, and arterial line were placed and the  [place] 3-point pin headrest was applied to the skull after the entire  head been shaved and prepped. She was then placed in the supine position  with her head in flexion and the bifrontal region was then further shaved,  prepped, and draped in a standard fashion.    A bicoronal incision was made starting at the right zygoma extending up  across the midline to just below the left superior temporal line. The  incision was carried down through the subcutaneous tissues and galea and  skin edge bleeding was controlled with Raney clips. With sharp dissection,  the scalp was dissected and reflected anteriorly and held with towel clips.  A large pericranial graft was taken for later dural grafting. Then  utilizing the Midas Rex  drill, 4 burr holes were placed in the midline with  the most posterior being 2 cm behind the coronal suture and then 2  additional burr holes at the superior temporal line and anteriorly and  posteriorly. The craniotomy flap size was approximately 8 x 6 cm. The dura  was cleared from beneath the burr holes and then the burr holes were  connected with the Midas Rex drill, with a B1 footplate attachment and the  bone plate was removed and central tack-up holes were placed. This was then  placed in antibiotic solution. Then circumferential wire pass holes were  placed and 4-0 Nurolon tack-up sutures were placed and held with hemostats.  Then the dura was opened in a U-shape fashion based medially and reflected  medially and the tumor was immediately obvious, which was very adherent to  the dura consistent with meningioma. This dissection then proceeded  medially until we got around the tumor and then the dura, since it was  invaded with tumor, was excised and sent to pathology. Then the pia  arachnoid of the brain surrounding  the tumor edges was coagulated and  incised and an obvious tumor plane was demonstrated. The tumor was then  internally debulked with a combination of Bovie cautery loops, as well as  the ultrasonic aspirator. The edges were collapsed down and the brain  itself was protected with serial half by 3 patties and large chunks of the  tumor were removed. Frozen section had been sent initially and returned a  diagnosis of meningioma. Circumferential dissection then proceeded and  there were several very large chunks of calcium and these portions of the  tumor were then excised with a Metzenbaum scissor and the lateral  three-quarters of the tumor was removed and the brain beneath this was  preserved. Then the medial dissection proceeded and the remaining tumor  attachment along the undersurface of the lateral leaves of the sagittal  sinus was accomplished with the bipolar cautery and the  microscissors.  Ultimately a gross total resection of the tumor was accomplished. The  tumor, as stated, was 6 x 6 x 5 cm. Then meticulous hemostasis was achieved  in the bed of the cortical resection and the raw cortical surfaces were  lined with Surgicel and meticulous hemostasis was observed. The pericranial  patch graft was then found to be of appropriate size and this was  circumferentially closed with running and interrupted 4-0 Nurolon sutures  with a watertight closure. A central dural tack-up was placed and brought  out through the wire pass holes in the bone plate, which was reaffixed and  then held with CranioFix fixators. The wound was irrigated with antibiotic  solution and hemostasis was achieved with the bipolar cautery. Then the  galea was reapproximated with interrupted 0 Vicryl, subcutaneous tissues  with 2-0 Vicryl, and the skin was approximated with staples. Counts were  correct. Blood loss was 200 mL. Wound was dressed with Xeroform, dry gauze  and circumferential Kling and Kerlix wrap. Patient awoke and was extubated  and was conversant and following commands in all 4 extremities and  transferred to Intensive Care in stable condition.                      Augustin Schooling, MD    JPP:wmx  D: 11/08/2011 12:05 P T: 11/08/2011 03:55 P  Job#:  [id number]  CScriptDoc #:  [id number]  cc:   NOEL Brynda Peon, MD        Augustin Schooling, MD        Bunnie Philips PFEFFER, MD

## 2011-11-09 LAB — METABOLIC PANEL, COMPREHENSIVE
A-G Ratio: 1 (ref 0.8–1.7)
ALT (SGPT): 274 U/L — ABNORMAL HIGH (ref 12.0–78.0)
AST (SGOT): 248 U/L — ABNORMAL HIGH (ref 15–37)
Albumin: 3.1 g/dL — ABNORMAL LOW (ref 3.4–5.0)
Alk. phosphatase: 163 U/L — ABNORMAL HIGH (ref 50–136)
Anion gap: 11 mmol/L (ref 3.0–18)
BUN/Creatinine ratio: 26 — ABNORMAL HIGH (ref 12–20)
BUN: 18 MG/DL (ref 7.0–18)
Bilirubin, total: 0.4 MG/DL (ref 0.2–1.0)
CO2: 29 MMOL/L (ref 21–32)
Calcium: 7.5 MG/DL — ABNORMAL LOW (ref 8.5–10.1)
Chloride: 102 MMOL/L (ref 100–108)
Creatinine: 0.7 MG/DL (ref 0.6–1.3)
GFR est AA: >60 mL/min/{1.73_m2} (ref >60)
GFR est non-AA: >60 mL/min/{1.73_m2} (ref >60)
Globulin: 3.1 g/dL (ref 2.0–4.0)
Glucose: 155 MG/DL — ABNORMAL HIGH (ref 74–99)
Potassium: 4.3 MMOL/L (ref 3.5–5.5)
Protein, total: 6.2 g/dL — ABNORMAL LOW (ref 6.4–8.2)
Sodium: 142 MMOL/L (ref 136–145)

## 2011-11-09 LAB — CBC WITH AUTOMATED DIFF
ABS. BASOPHILS: 0 10*3/uL (ref 0.0–0.06)
ABS. EOSINOPHILS: 0 10*3/uL (ref 0.0–0.4)
ABS. LYMPHOCYTES: 1 10*3/uL (ref 0.9–3.6)
ABS. MONOCYTES: 1.2 10*3/uL (ref 0.05–1.2)
ABS. NEUTROPHILS: 10.7 10*3/uL — ABNORMAL HIGH (ref 1.8–8.0)
BASOPHILS: 0 % (ref 0–2)
EOSINOPHILS: 0 % (ref 0–5)
HCT: 36 % (ref 35.0–45.0)
HGB: 12 g/dL (ref 12.0–16.0)
LYMPHOCYTES: 8 % — ABNORMAL LOW (ref 21–52)
MCH: 30.5 PG (ref 24.0–34.0)
MCHC: 33.3 g/dL (ref 31.0–37.0)
MCV: 91.6 FL (ref 74.0–97.0)
MONOCYTES: 10 % (ref 3–10)
MPV: 11.7 FL (ref 9.2–11.8)
NEUTROPHILS: 82 % — ABNORMAL HIGH (ref 40–73)
PLATELET: 214 10*3/uL (ref 135–420)
RBC: 3.93 M/uL — ABNORMAL LOW (ref 4.20–5.30)
RDW: 13.2 % (ref 11.6–14.5)
WBC: 12.9 10*3/uL (ref 4.6–13.2)

## 2011-11-09 LAB — PHENYTOIN: Phenytoin: 18 ug/mL (ref 10.0–20.0)

## 2011-11-09 LAB — MAGNESIUM: Magnesium: 1.8 MG/DL (ref 1.8–2.4)

## 2011-11-09 MED ADMIN — clindamycin phosphate 900 mg in 0.9% sodium chloride (MBP/ADV) 100 mL ADV: INTRAVENOUS | @ 03:00:00 | NDC 00409710167

## 2011-11-09 MED ADMIN — carvedilol (COREG) tablet 6.25 mg: ORAL | @ 21:00:00 | NDC 68084026111

## 2011-11-09 MED ADMIN — ioversol (OPTIRAY) 320 mg iodine/mL contrast injection 80 mL: INTRAVENOUS | @ 15:00:00 | NDC 00019132327

## 2011-11-09 MED ADMIN — HYDROmorphone (PF) (DILAUDID) injection 1 mg: INTRAVENOUS | @ 04:00:00 | NDC 00409128331

## 2011-11-09 MED ADMIN — rosuvastatin (CRESTOR) tablet 5 mg: ORAL | @ 01:00:00 | NDC 00310075139

## 2011-11-09 MED ADMIN — phenytoin (DILANTIN) injection 100 mg: INTRAVENOUS | @ 22:00:00 | NDC 00641049321

## 2011-11-09 MED ADMIN — sodium chloride (NS) flush 5-10 mL: INTRAVENOUS | @ 18:00:00 | NDC 87701099893

## 2011-11-09 MED ADMIN — phenytoin (DILANTIN) injection 100 mg: INTRAVENOUS | @ 04:00:00 | NDC 00641049321

## 2011-11-09 MED ADMIN — sodium chloride (NS) flush 5-10 mL: INTRAVENOUS | @ 10:00:00 | NDC 87701099893

## 2011-11-09 MED ADMIN — HYDROmorphone (PF) (DILAUDID) injection 1 mg: INTRAVENOUS | @ 13:00:00 | NDC 00409128331

## 2011-11-09 MED ADMIN — HYDROmorphone (PF) (DILAUDID) injection 1 mg: INTRAVENOUS | @ 20:00:00 | NDC 00409128331

## 2011-11-09 MED ADMIN — fluticasone (FLONASE) 50 mcg/actuation nasal spray 2 Spray: NASAL | @ 13:00:00 | NDC 50383070016

## 2011-11-09 MED ADMIN — phenytoin (DILANTIN) injection 100 mg: INTRAVENOUS | @ 13:00:00 | NDC 00641049321

## 2011-11-09 MED ADMIN — dexamethasone (DECADRON) 4 mg/mL injection 4 mg: INTRAVENOUS | @ 10:00:00 | NDC 63323016501

## 2011-11-09 MED ADMIN — montelukast (SINGULAIR) tablet 10 mg: ORAL | @ 01:00:00 | NDC 68084062011

## 2011-11-09 MED ADMIN — carvedilol (COREG) tablet 6.25 mg: ORAL | @ 12:00:00 | NDC 68084026111

## 2011-11-09 MED ADMIN — sodium chloride (NS) flush 5-10 mL: INTRAVENOUS | @ 01:00:00 | NDC 87701099893

## 2011-11-09 MED ADMIN — dexamethasone (DECADRON) 4 mg/mL injection 4 mg: INTRAVENOUS | @ 04:00:00 | NDC 63323016501

## 2011-11-09 MED ADMIN — dexamethasone (DECADRON) 4 mg/mL injection 4 mg: INTRAVENOUS | @ 22:00:00 | NDC 63323016501

## 2011-11-09 MED ADMIN — dexamethasone (DECADRON) 4 mg/mL injection 4 mg: INTRAVENOUS | @ 17:00:00 | NDC 63323016501

## 2011-11-09 MED FILL — NEOSTIGMINE METHYLSULFATE 1 MG/ML INJECTION: 1 mg/mL | INTRAMUSCULAR | Qty: 1

## 2011-11-09 MED FILL — DEXAMETHASONE SODIUM PHOSPHATE 4 MG/ML IJ SOLN: 4 mg/mL | INTRAMUSCULAR | Qty: 1

## 2011-11-09 MED FILL — PHENYTOIN SODIUM 50 MG/ML IV: 50 mg/mL | INTRAVENOUS | Qty: 2

## 2011-11-09 MED FILL — ROCURONIUM 10 MG/ML IV: 10 mg/mL | INTRAVENOUS | Qty: 1

## 2011-11-09 MED FILL — ONDANSETRON (PF) 4 MG/2 ML INJECTION: 4 mg/2 mL | INTRAMUSCULAR | Qty: 1

## 2011-11-09 MED FILL — DIPRIVAN 10 MG/ML INTRAVENOUS EMULSION: 10 mg/mL | INTRAVENOUS | Qty: 1

## 2011-11-09 MED FILL — HYDROMORPHONE (PF) 1 MG/ML IJ SOLN: 1 mg/mL | INTRAMUSCULAR | Qty: 1

## 2011-11-09 MED FILL — QUELICIN 20 MG/ML INJECTION SOLUTION: 20 mg/mL | INTRAMUSCULAR | Qty: 1

## 2011-11-09 MED FILL — LIDOCAINE 2 % MUCOSAL GEL: 2 % | Qty: 1

## 2011-11-09 MED FILL — SINGULAIR 10 MG TABLET: 10 mg | ORAL | Qty: 1

## 2011-11-09 MED FILL — AMIDATE 2 MG/ML INTRAVENOUS SOLUTION: 2 mg/mL | INTRAVENOUS | Qty: 1

## 2011-11-09 MED FILL — CARVEDILOL 3.125 MG TAB: 3.125 mg | ORAL | Qty: 2

## 2011-11-09 MED FILL — GLYCOPYRROLATE 0.2 MG/ML IJ SOLN: 0.2 mg/mL | INTRAMUSCULAR | Qty: 1

## 2011-11-09 MED FILL — CRESTOR 10 MG TABLET: 10 mg | ORAL | Qty: 1

## 2011-11-09 MED FILL — CLINDAMYCIN 900 MG/6 ML IV: 900 mg/6 mL | INTRAVENOUS | Qty: 6

## 2011-11-09 MED FILL — OPTIRAY 320 MG IODINE/ML INTRAVENOUS SYRINGE: 320 mg iodine/mL | INTRAVENOUS | Qty: 125

## 2011-11-09 NOTE — Undefined (Signed)
rec'd pt resting in bed awake alert and oriented x3.  Denies pain currently.  Taking po without difficulty.  Encouraged to continue.  Dressing kerlex to scalp-dry and intact.  SCDs in place.

## 2011-11-09 NOTE — Progress Notes (Signed)
S: c/o headache  O: afeb, VSS, alert and oriented x 3 and no motor or sensory defecits, dressing dry, some right eye swelling as expected  A: doing well  P: will get Head CT today

## 2011-11-09 NOTE — Progress Notes (Signed)
INTERNAL MEDICINE  Daily Progress Note    Patient:  Cathy [name]  Date of Admission:  11/04/2011    Interval History and Events of the last 24 hours:   She is awake , trying to eat breakfast       Assessment/Plan:       Patient seen in follow up for multiple medical problems as listed below :    Patient Active Problem List   Diagnosis Code   ??? CHF (congestive heart failure) 428.0   ??? 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 Fibroid 218.9   ??? HTN (hypertension) 401.9   ??? History of elevated lipids V12.29   ??? Gallstones 574.20   ??? Insomnia 780.52   ??? OSA (obstructive sleep apnea) 327.23   ??? Cataract 366.9   ??? Cardiomyopathy 425.4   ??? AICD (automatic cardioverter/defibrillator) present V45.02   ??? Chronic systolic heart failure 428.22   ??? 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 Automatic implantable cardiac defibrillator in situ V45.02   ??? Hypotension, unspecified 458.9   ??? Other and unspecified hyperlipidemia 272.4   ??? AR (allergic rhinitis) 477.9   ??? Brain tumor (benign) 225.0       General:      Code Status:  .Full Code     Plan    Right frontal tumor  presenting with seizure   s/pight frontal craniotomy with gross total resection of parasagittal meningioma.   Will continue steroids as per neurosurgery-       CHF (congestive heart failure) with AICD and BiV pacer in situ   Patient states that she has an EF of 20% and used to get short of breath but this has improved since BiV and AICD placement.   No acute issues       Hypothyroid hx goiter  Will continue regular medications.     HTN (hypertension)  Will continue all regular medications, monitor ans adjust as needed.     OSA (obstructive sleep apnea  Did previously use CPAP but has not used for a long time as she felt congested in them mornings.    Hypomag  Replace per protocol     Disposition and Family:   Recommend to continue hospitalization. Discussed with patient and nursing staff.    Labwork and Ancillary Studies     All labs/tests/imaging reviewed.Spoke with the  nurse regarding patient issues.    Labwork:    CBC w/Diff   Lab Results   Component Value Date/Time    WBC 12.9 11/09/2011  3:15 AM    HGB 12.0 11/09/2011  3:15 AM    HCT 36.0 11/09/2011  3:15 AM    PLATELET 214 11/09/2011  3:15 AM    MCV 91.6 11/09/2011  3:15 AM        Basic Metabolic Profile   Lab Results   Component Value Date/Time    Sodium 142 11/09/2011  3:15 AM    Potassium 4.3 11/09/2011  3:15 AM    Chloride 102 11/09/2011  3:15 AM    CO2 29 11/09/2011  3:15 AM    Anion gap 11 11/09/2011  3:15 AM    Glucose 155 11/09/2011  3:15 AM    BUN 18 11/09/2011  3:15 AM    Creatinine 0.70 11/09/2011  3:15 AM    BUN/Creatinine ratio 26 11/09/2011  3:15 AM    GFR est non-AA >60 11/09/2011  3:15 AM    Calcium 7.5 11/09/2011  3:15 AM    GFR est  AA >60 11/09/2011  3:15 AM        Cardiac Enzymes   Lab Results   Component Value Date/Time    CK 41 11/04/2011  8:15 AM    CK - MB <0.5 11/04/2011  8:15 AM    CK-MB Index CANNOT BE CALCULATED 11/04/2011  8:15 AM    Troponin-I, Qt. <0.02 11/04/2011  8:15 AM      Arterial Blood Gases   No results found for this basename: ph,  phi,  pco2,  pco2i,  po2,  po2i,  hco3,  hco3i,  fio2,  fio2i      Coagulation      No results found for this basename: PTTP,  APTT,  PTP,  INR,  INRT      Hepatic Function   Lab Results   Component Value Date/Time    ALT 274 11/09/2011  3:15 AM    AST 248 11/09/2011  3:15 AM    Alk. phosphatase 163 11/09/2011  3:15 AM    Bilirubin, direct 0.1 06/07/2008 10:46 AM    Bilirubin, total 0.4 11/09/2011  3:15 AM          Chest xray  IMPRESSION:   1. Interval right arm PICC line placement with tip in the superior most SVC.   2. Interval resolution of pulmonary edema/vascular congestion.              Current Inpatient Meds and Allergies     Medications:  Current Facility-Administered Medications   Medication Dose Route Frequency   ??? dexamethasone (DECADRON) 4 mg/mL injection 4 mg  4 mg IntraVENous Q6H   ??? clindamycin phosphate 900 mg in 0.9% sodium chloride (MBP/ADV) 100 mL ADV  900 mg  IntraVENous Q8H   ??? phenytoin (DILANTIN) injection 100 mg  100 mg IntraVENous Q8H   ??? sodium chloride (NS) flush 10-30 mL  10-30 mL InterCATHeter PRN   ??? bacitracin 500 unit/gram packet 1 Packet  1 Packet Topical PRN   ??? carvedilol (COREG) tablet 6.25 mg  6.25 mg Oral BID WITH MEALS   ??? fluticasone (FLONASE) 50 mcg/actuation nasal spray 2 Spray  2 Spray Both Nostrils DAILY   ??? montelukast (SINGULAIR) tablet 10 mg  10 mg Oral QHS   ??? rosuvastatin (CRESTOR) tablet 5 mg  5 mg Oral QHS   ??? traMADol (ULTRAM) tablet 50 mg  50 mg Oral Q6H PRN   ??? sodium chloride (NS) flush 5-10 mL  5-10 mL IntraVENous Q8H   ??? sodium chloride (NS) flush 5-10 mL  5-10 mL IntraVENous PRN   ??? acetaminophen (TYLENOL) tablet 650 mg  650 mg Oral Q4H PRN   ??? HYDROmorphone (PF) (DILAUDID) injection 1 mg  1 mg IntraVENous Q4H PRN   ??? naloxone (NARCAN) injection 0.4 mg  0.4 mg IntraVENous PRN   ??? ondansetron (ZOFRAN) injection 4 mg  4 mg IntraVENous Q4H PRN   ??? ondansetron (ZOFRAN ODT) tablet 4 mg  4 mg Oral Q4H PRN      ROS  Constitutional: Negative for diaphoresis. Positive for weakness   HENT: Negative for congestion.   Respiratory: Negative for cough and shortness of breath.   Cardiovascular: Negative for chest pain and palpitations.   Gastrointestinal: Negative for diarrhea.   Genitourinary: Negative for flank pain.   Musculoskeletal: Negative for back pain.   Skin: Negative for pallor.   Neurological: Nothing except in H&P - shaking and posturing of the right arm       Objective:  BP 119/66   Pulse 92   Temp 98.9 ??F (37.2 ??C)   Resp 15   Ht 5\' 10"  (1.778 m)   Wt 89.359 kg (197 lb)   BMI 28.27 kg/m2   SpO2 96%    Intake/Output Summary (Last 24 hours) at 11/09/11 1029  Last data filed at 11/09/11 0800   Gross per 24 hour   Intake    725 ml   Output    935 ml   Net   -210 ml       General appearance: Sedated after surgery on anesthesia   Head: Normocephalic, without obvious abnormality, atraumatic   Neck: Supple, no lymphadenopathy or  thyromegaly, trachea midline   Lungs: Clear to auscultation bilaterally   Heart: Regular rate and rhythm, S1, S2 normal, no murmur, click, rub or gallop   Abdomen: Soft, non-tender and not-distended. Bowel sounds normal. No masses, no organomegaly   Extremities: Extremities normal, atraumatic, no cyanosis or edema   Skin: Skin color, texture, turgor normal. No rashes or lesions   Neurologic:  Awake , oriented       Total time spent with chart review, patient examination/education, discussion with staff on case,documentation and medication management / adjustment:  25 Minutes    It is always a pleasure to be involved in the clinical care of this patient.    Roderic Scarce, MD  November 09, 2011  [facility] Internal Medicine  Pager:  [phone]

## 2011-11-09 NOTE — Progress Notes (Signed)
[facility]  -  Progress Note      Patient: Cathy [name] MRN: [id number]  SSN: [id number]    Date of Birth: 05-11-1950  Age: 61 y.o.  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  Unknown Yes    Overview    Signed 05/31/2011 11:24 AM by Javier Docker     Non ischemic, pt was taken off coreg by pmd, possibly related to side effect and low bp  Continue restart of coreg as pt has ef of 20%  i have discussed risk and benefit of icd implant with pt, she is agreeable now  Pt off lisinopril due to lop bp, she does not want to try it again  Refer to dr seutter for icd            AICD (automatic cardioverter/defibrillator) present V45.02  11/15/2010 Yes    Overview    Signed 11/15/2010  2:40 PM by Arletha Pili, MD     Implanted dual chamber medtronic AICD 11/15/10          OSA (obstructive sleep apnea) 327.23  11/13/2008 Yes        CHF (congestive heart failure) 428.0  06/23/2008 Yes        Hypothyroid hx goiter 244.9  06/23/2008 Yes        HTN (hypertension) 401.9  06/23/2008 Yes        History of elevated lipids V12.29  06/23/2008 Yes              Plan:     Tolerated surgery from cardiac standpoint; will be available as needed.    Subjective:     No new complaints.     Objective:      Patient Vitals for the past 8 hrs:   Temp Pulse Resp BP SpO2   11/09/11 1200 99 ??F (37.2 ??C) 87  18  128/66 mmHg 98 %   11/09/11 1100 - - - 118/60 mmHg -   11/09/11 1000 - 82  12  120/64 mmHg 93 %   11/09/11 0900 - 92  15  119/66 mmHg 96 %   11/09/11 0800 98.9 ??F (37.2  ??C) 94  17  118/59 mmHg 100 %   11/09/11 0700 - 90  14  134/60 mmHg 100 %   11/09/11 0600 - 89  17  130/67 mmHg 100 %         No data found.        Intake/Output Summary (Last 24 hours) at 11/09/11 1301  Last data filed at 11/09/11 1200   Gross per 24 hour   Intake    945 ml   Output   1240 ml   Net   -295 ml       Physical Exam:  General:  cooperative, no distress, appears stated age, cranium dressed and  right periorbital area swollen  Neck:  no JVD  Lungs:  clear to auscultation bilaterally  Heart:  regular rate and rhythm  Abdomen:  no guarding or rigidity  Extremities:  extremities normal, atraumatic, no cyanosis or edema    Data Review:     Labs: Results:       Chemistry Recent Labs   [facility] 11/09/11 0315 11/08/11 0400 11/07/11 0315    GLU 155* 135* 153*    NA 142 142 140    K 4.3 4.2 3.8    CL 102 103 101    CO2 29 26 28     BUN 18 16 13     CREA 0.70 0.75 0.64    CA 7.5* 8.3* 8.4*    MG 1.8 1.8 1.8    PHOS -- -- --    AGAP 11 13 11     BUCR 26* 21* 20    TBIL 0.4 0.2 0.2    GPT 274* 43 20    AP 163* 100 103    TP 6.2* 6.2* 6.5    ALB 3.1* 3.2* 3.4    GLOB 3.1 3.0 3.1    AGRAT 1.0 1.1 1.1      CBC w/Diff Recent Labs   [facility] 11/09/11 0315 11/08/11 0400 11/07/11 0315    WBC 12.9 12.1 12.5    RBC 3.93* 3.95* 3.92*    HGB 12.0 11.9* 11.7*    HCT 36.0 36.1 35.9    PLT 214 201 200    GRANS 82* 89* 90*    LYMPH 8* 7* 6*    EOS 0 0 0      Cardiac Enzymes No results found for this basename: CPK, CK, CPKMB, CKRMB, CKMMB, CKMB, RCK3, CKMBT, CKMBPC, CKSMB, CKNDX, CKND1, MYO, TROQR, TROPT, TROIQ, TROIP, TROI, TROPIT, TROPT, TRPOIT, ITNL, TNIPOC, BNP, BNPP, PBNP      Coagulation Recent Labs   Basename 11/07/11 0315    PTP 13.2    INR 1.1    APTT 25.2       Lipid Panel Lab Results   Component Value Date/Time    Cholesterol, total 229 09/02/2011  9:07 AM    HDL Cholesterol 56 09/02/2011  9:07 AM    LDL, calculated 164 09/02/2011  9:07 AM    VLDL, calculated 9 09/02/2011  9:07 AM    Triglyceride 47 09/02/2011  9:07 AM    CHOL/HDL  Ratio 3.4 11/23/2008 11:55 AM      BNP Lab Results   Component Value Date/Time    NT pro-BNP 2707 11/05/2011  4:15 AM    NT pro-BNP 678 06/05/2008  1:45 PM      Liver Enzymes Recent Labs   Basename 11/09/11 0315    TP 6.2*    ALB 3.1*    TBIL 0.4    AP 163*    SGOT 248*    GPT 274*      Digoxin    Thyroid Studies Lab Results   Component Value Date/Time    TSH 1.590 02/06/2010 11:48 AM

## 2011-11-09 NOTE — Other (Addendum)
0730:  Assumed care of pt at this time.  Head to toe assessment performed.  Pt noted to have AICD in place, cannot have MRI as ordered.  Dr. Alphonzo Severance notified.  Orders for CT/CTA received.      1200:  CT/CTA complete without incident.  Pt remains slightly drowsy but easily arousable. Husband at bedside.  Pt does not like texture of her food, states difficulty with chewing.  Applesauce and pudding provided.  Pt ate easily.  PO fluid intake encouraged.  ICS introduced, pt able to achieve volumes of 500-750.  Pt uses ICS independently.      1930:Bedside and Verbal shift change report given to Elliot Gault (oncoming nurse) by Georjean Mode (offgoing nurse).  Report given with SBAR.

## 2011-11-09 NOTE — Other (Signed)
Bedside and Verbal shift change report given to R. Warrick Parisian, RN (Cabin crew) by A. Wynona Neat, RN (offgoing nurse).  Report given with SBAR, Kardex, Intake/Output, MAR and Med Rec Status.

## 2011-11-09 NOTE — Progress Notes (Signed)
11/09/2011  6:55 AM  Shift summary:  Pt remained stable throughout the night. She CO head pain which was relieved with IV Dilaudid. Pt drowsy but oriented x3. She was confused to the day, but could state the month and year. Pt can move all extremities equally and has no visual deficits. Scant incisional drainage. All VS and UOP WNL.

## 2011-11-10 LAB — METABOLIC PANEL, COMPREHENSIVE
A-G Ratio: 0.9 (ref 0.8–1.7)
ALT (SGPT): 185 U/L — ABNORMAL HIGH (ref 12.0–78.0)
AST (SGOT): 57 U/L — ABNORMAL HIGH (ref 15–37)
Albumin: 2.9 g/dL — ABNORMAL LOW (ref 3.4–5.0)
Alk. phosphatase: 153 U/L — ABNORMAL HIGH (ref 50–136)
Anion gap: 12 mmol/L (ref 3.0–18)
BUN/Creatinine ratio: 25 — ABNORMAL HIGH (ref 12–20)
BUN: 14 MG/DL (ref 7.0–18)
Bilirubin, total: 0.4 MG/DL (ref 0.2–1.0)
CO2: 27 MMOL/L (ref 21–32)
Calcium: 8.2 MG/DL — ABNORMAL LOW (ref 8.5–10.1)
Chloride: 100 MMOL/L (ref 100–108)
Creatinine: 0.56 MG/DL — ABNORMAL LOW (ref 0.6–1.3)
GFR est AA: >60 mL/min/{1.73_m2} (ref >60)
GFR est non-AA: >60 mL/min/{1.73_m2} (ref >60)
Globulin: 3.2 g/dL (ref 2.0–4.0)
Glucose: 124 MG/DL — ABNORMAL HIGH (ref 74–99)
Potassium: 4 MMOL/L (ref 3.5–5.5)
Protein, total: 6.1 g/dL — ABNORMAL LOW (ref 6.4–8.2)
Sodium: 139 MMOL/L (ref 136–145)

## 2011-11-10 LAB — CBC WITH AUTOMATED DIFF
ABS. BASOPHILS: 0 10*3/uL (ref 0.0–0.06)
ABS. EOSINOPHILS: 0 10*3/uL (ref 0.0–0.4)
ABS. LYMPHOCYTES: 2 10*3/uL (ref 0.9–3.6)
ABS. MONOCYTES: 1.4 10*3/uL — ABNORMAL HIGH (ref 0.05–1.2)
ABS. NEUTROPHILS: 6.8 10*3/uL (ref 1.8–8.0)
BASOPHILS: 0 % (ref 0–2)
EOSINOPHILS: 0 % (ref 0–5)
HCT: 35.9 % (ref 35.0–45.0)
HGB: 11.8 g/dL — ABNORMAL LOW (ref 12.0–16.0)
LYMPHOCYTES: 20 % — ABNORMAL LOW (ref 21–52)
MCH: 29.9 PG (ref 24.0–34.0)
MCHC: 32.9 g/dL (ref 31.0–37.0)
MCV: 90.9 FL (ref 74.0–97.0)
MONOCYTES: 13 % — ABNORMAL HIGH (ref 3–10)
MPV: 11.3 FL (ref 9.2–11.8)
NEUTROPHILS: 67 % (ref 40–73)
PLATELET: 183 10*3/uL (ref 135–420)
RBC: 3.95 M/uL — ABNORMAL LOW (ref 4.20–5.30)
RDW: 12.8 % (ref 11.6–14.5)
WBC: 10.3 10*3/uL (ref 4.6–13.2)

## 2011-11-10 LAB — CULTURE, BLOOD
Culture result:: NO GROWTH
Culture result:: NO GROWTH

## 2011-11-10 LAB — MAGNESIUM: Magnesium: 1.9 MG/DL (ref 1.8–2.4)

## 2011-11-10 MED ADMIN — sodium chloride (NS) flush 5-10 mL: INTRAVENOUS | @ 21:00:00 | NDC 87701099893

## 2011-11-10 MED ADMIN — phenytoin (DILANTIN) injection 100 mg: INTRAVENOUS | @ 12:00:00 | NDC 00641049321

## 2011-11-10 MED ADMIN — montelukast (SINGULAIR) tablet 10 mg: ORAL | @ 01:00:00 | NDC 68084062011

## 2011-11-10 MED ADMIN — HYDROmorphone (PF) (DILAUDID) injection 1 mg: INTRAVENOUS | @ 13:00:00 | NDC 00409128331

## 2011-11-10 MED ADMIN — sodium chloride (NS) flush 5-10 mL: INTRAVENOUS | @ 02:00:00 | NDC 87701099893

## 2011-11-10 MED ADMIN — phenytoin (DILANTIN) injection 100 mg: INTRAVENOUS | @ 04:00:00 | NDC 00641049321

## 2011-11-10 MED ADMIN — dexamethasone (DECADRON) 4 mg/mL injection 4 mg: INTRAVENOUS | @ 22:00:00 | NDC 63323016501

## 2011-11-10 MED ADMIN — HYDROmorphone (PF) (DILAUDID) injection 1 mg: INTRAVENOUS | @ 02:00:00 | NDC 00409128331

## 2011-11-10 MED ADMIN — fluticasone (FLONASE) 50 mcg/actuation nasal spray 2 Spray: NASAL | @ 13:00:00 | NDC 50383070016

## 2011-11-10 MED ADMIN — carvedilol (COREG) tablet 6.25 mg: ORAL | @ 13:00:00 | NDC 68084026111

## 2011-11-10 MED ADMIN — carvedilol (COREG) tablet 6.25 mg: ORAL | @ 20:00:00 | NDC 68084026211

## 2011-11-10 MED ADMIN — rosuvastatin (CRESTOR) tablet 5 mg: ORAL | @ 02:00:00 | NDC 00310075139

## 2011-11-10 MED ADMIN — dexamethasone (DECADRON) 4 mg/mL injection 4 mg: INTRAVENOUS | @ 16:00:00 | NDC 63323016501

## 2011-11-10 MED ADMIN — phenytoin (DILANTIN) injection 100 mg: INTRAVENOUS | @ 20:00:00 | NDC 00641255541

## 2011-11-10 MED ADMIN — dexamethasone (DECADRON) 4 mg/mL injection 4 mg: INTRAVENOUS | @ 12:00:00 | NDC 63323016501

## 2011-11-10 MED ADMIN — dexamethasone (DECADRON) 4 mg/mL injection 4 mg: INTRAVENOUS | @ 04:00:00 | NDC 63323016501

## 2011-11-10 MED FILL — SINGULAIR 10 MG TABLET: 10 mg | ORAL | Qty: 1

## 2011-11-10 MED FILL — DEXAMETHASONE SODIUM PHOSPHATE 4 MG/ML IJ SOLN: 4 mg/mL | INTRAMUSCULAR | Qty: 1

## 2011-11-10 MED FILL — CARVEDILOL 3.125 MG TAB: 3.125 mg | ORAL | Qty: 2

## 2011-11-10 MED FILL — CARVEDILOL 6.25 MG TAB: 6.25 mg | ORAL | Qty: 1

## 2011-11-10 MED FILL — CRESTOR 10 MG TABLET: 10 mg | ORAL | Qty: 1

## 2011-11-10 MED FILL — HYDROMORPHONE (PF) 1 MG/ML IJ SOLN: 1 mg/mL | INTRAMUSCULAR | Qty: 1

## 2011-11-10 MED FILL — PHENYTOIN SODIUM 50 MG/ML IV: 50 mg/mL | INTRAVENOUS | Qty: 2

## 2011-11-10 MED FILL — PHENYTOIN SODIUM 50 MG/ML IV: 50 mg/mL | INTRAVENOUS | Qty: 5

## 2011-11-10 NOTE — Progress Notes (Signed)
S: headache improving  O: awake and alert and non-focal exam. Post-op CT- total resection of tumor and some blood and Surgicel in tumor bed  A: doing very well  P: Transfer to floor and mobilize.

## 2011-11-10 NOTE — Progress Notes (Signed)
Chaplain completed follow up visit with and Spiritual assessment of patient and offered Pastoral care, see flow sheets for interventions. Chaplains will continue to follow and will provide pastoral care on an as needed/requested basis    Chaplain Ernie Etheridge   Board Certified Chaplain   Spiritual Care   [phone]

## 2011-11-10 NOTE — Progress Notes (Signed)
Contnue assisted with position changing /VSS.  Pt pleasant, no changes.

## 2011-11-10 NOTE — Progress Notes (Signed)
1000 patient is transferred from ICU, alert and oriented times 3, complains of weakness in extremities on standing up and says that PT therapist got her up but she was not able to stand. Walker in the room but patient is on bedrest at the moment due to weakness, will continue to monitor. Vital signs are stable, no complaints of nausea or vomiting, no headache, vision is not blurry, moving all extremities, no numbness or tingling in any extremities. Patient voided in bedpan. Sequential compression devices are on.  1200 Patient's family is at bedside, no pain reported, patient is comfortable. Per doctor Ok to use Triflo.   1800 patient ambulates to bathroom with assist and with a walker, voids. Family at bedside, no complaints of pain.

## 2011-11-10 NOTE — Progress Notes (Signed)
0740 Received report from [facility], assumed care.  0820 Dc'd A line, Foley, Pt has transfer orders  0859 TRANSFER - OUT REPORT:    Verbal report given to Margot Chimes on Cathy [name]  being transferred to 2220 (2C) for routine progression of care       Report consisted of patient???s Situation, Background, Assessment and   Recommendations(SBAR).     Information from the following report(s) SBAR, Kardex, Intake/Output, MAR and Recent Results was reviewed with the receiving nurse.    Opportunity for questions and clarification was provided.     Pt stable, transferrred via wheelchair by [employer].

## 2011-11-10 NOTE — Progress Notes (Signed)
NUTRITION  Patient seen for:      []       Supplements      [x]         PO intake check   []       Food Allergies              [x]         Food Preferences/tolerances    []       Glycemic control           []         Education    []       Diet order clarification  []         Other:   ________________________________________________________________   [x]   No Cultural, religious or ethnic dietary need identified.   []   Cultural, religious and ethnic food preferences identified and addressed    []   Participated in discharge planning/Interdisciplinary rounds  Food Allergies: [x]    No    []    Yes-     ASSESSMENT/PLAN     Appetite is adequate with intake >75% most meals. No problems chewing or swallowing.  Preferences have been implemented.    Recommend POC glucose testing and corrective Novolog while on steroids.      SUBJECTIVE/OBJECTIVE:   Information obtained from:  patient      Diet:  regular    Intake: []            Good     [x]            Fair      []            Poor    []     Inconsistent  Patient Vitals for the past 100 hrs:   % Diet Eaten   11/10/11 1330 25 %   11/09/11 1800 100 %   11/06/11 1800 100 %   11/06/11 1330 100 %       Most Recent POC Glucose: Recent Labs   Basename 11/10/11 0530 11/09/11 0315 11/08/11 0400    GLU 124* 155* 135*        Nutrition Problems Identified  []      None              [x]      Elevated blood sugars  []      Hypoglycemic episode(s)   []      Suboptimal po intake  []      Specified food preferences             []      Allergies    []      Difficulty chewing     []      Dentition   []      Nausea/Vomiting  []      Constipation   []      Diarrhea  []     Other                                           []      Difficulty swallowing                                               :     PLAN:   [x]      Obtained/adjusted food preferences/tolerances and/or snacks options     [  x]     Assist with menu selection  [x]      Monitor po intake on meal rounds  [x]      Continue inpatient monitoring and intervention  []       Other:    Nida Boatman, RD

## 2011-11-10 NOTE — Other (Signed)
Bedside and Verbal shift change report given to Guinevere Ferrari, RN (oncoming nurse) by Bobbye Riggs (offgoing nurse).  Report given with SBAR, Kardex, MAR and Recent Results.

## 2011-11-10 NOTE — Progress Notes (Signed)
1100 attempted to assist pt to the bathroom ,was already sitting at the edge of the bed, but she said she  Feels light headed, given her a bedpan to void, but told her next time she will be assisted to  The bathroom.  1500 assisted by CNA to the bathroom, minimal assist but steady on her feet.  1830 alert and oriented, okay to use a straw and okay to use triflo if no headache, so far not complaint of headache been using triflo.  1855 no pain at this time.

## 2011-11-10 NOTE — Progress Notes (Signed)
Problem: Self Care Deficits Care Plan (Adult)  Goal: *Acute Goals and Plan of Care (Insert Text)  Occupational Therapy Goals  Initiated 11/10/2011 within 7 day(s).    1. Patient will perform bathing with supervision/set-up  2. Patient will perform upper body dressing with supervision/set-up.  3. Patient will perform lower body dressing with minimal assistance/contact guard assist.  4. Patient will perform toilet transfers with modified independence.  5. Patient will perform all aspects of toileting with modified independence.  6. Patient will participate in upper extremity therapeutic exercise/activities with modified independence for 5 minutes.   7. Patient will utilize energy conservation techniques during functional activities with verbal cues.   OCCUPATIONAL THERAPY EVALUATION    Patient: Cathy [name] (61 y.o. female)  Date: 11/10/2011  Primary Diagnosis: Brain mass  Brain mass  237.5  Procedure(s) (LRB):  CRANIOTOMY FOR TUMOR (Right) 2 Days Post-Op   Precautions:   Fall (pt. orthostatic on standing)      ASSESSMENT :   Based on the objective data described below, the patient presents with decreased functional mobility and ADLs. Patient with orthostatic BP with positional changes. She is very motivated to work with therapy and return home.    Patient will benefit from skilled intervention to address the above impairments.  Patient???s rehabilitation potential is considered to be Excellent  Factors which may influence rehabilitation potential include:   [ ]              None noted  [ ]              Mental ability/status  [X]              Medical condition  [ ]              Home/family situation and support systems  [ ]              Safety awareness  [ ]              Pain tolerance/management  [ ]              Other:        PLAN :   Recommendations and Planned Interventions:  [X]                Self Care Training                  [X]         Therapeutic Activities  [X]                Functional Mobility Training    [ ]          Cognitive Retraining  [X]                Therapeutic Exercises           [X]         Endurance Activities  [X]                Balance Training                   [ ]         Neuromuscular Re-Education  [ ]                Visual/Perceptual Training     [X]    Home Safety Training  [X]                Patient Education                 [  X]        Family Training/Education  [ ]                Other (comment):    Frequency/Duration: Patient will be followed by occupational therapy 1-2 times per day/4-7 days per week to address goals.  Discharge Recommendations: Rehab vs Home Health pending progress  Further Equipment Recommendations for Discharge: N/A       SUBJECTIVE:   Patient stated ???I feel dizzy.???      OBJECTIVE DATA SUMMARY:       Past Medical History   Diagnosis Date   ??? 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       radioactive iodine ablation of thyroid   ??? Fibroid 06/23/2008   ??? HTN (hypertension) 06/23/2008   ??? HLD (hyperlipidemia)     ??? Breast mass, left         benign   ??? OSA (obstructive sleep apnea) 9/10   ??? Chronic systolic heart failure         Stable,    ??? Automatic implantable cardiac defibrillator in situ         Post ddd icd Set up carelink   ??? Hypotension, unspecified         Related to lisinopril     Past Surgical History   Procedure Date   ??? Hx tonsil and adenoidectomy     ??? Hx tubal ligation     ??? Hx total abdominal hysterectomy 1996   ??? Hx cholecystectomy     ??? Hx pacemaker         dual chamber icd     Barriers to Learning/Limitations: no  Compensate with: visual, verbal, tactile, kinesthetic cues/model  Prior Level of Function/Home Situation:   Home Situation  Home Environment: Private residence  # Steps to Enter: 1   One/Two Story Residence: One Engineer, materials Steps: 13   Height of Each Step (in): 6 inches  Interior Rails: Both  Lift Chair Available: No  Living Alone: No (Simultaneous filing. User  may not have seen previous data.)  Support Systems: Child(ren);Family member(s);Spouse (Simultaneous filing. User may not have seen previous data.)  Patient Expects to be Discharged to:: Private residence  Current DME Used/Available at Home: None  [ ]   Right hand dominant            [ ]   Left hand dominant  Cognitive/Behavioral Status:  Neurologic State: Alert  Orientation Level: Oriented X4  Cognition: Appropriate decision making  Edema: none UE    Coordination:  Coordination: Within functional limits  Fine Motor Skills-Upper: Left Intact;Right Intact    Balance:  Sitting: Impaired;With support  Sitting - Static: Fair (occasional)  Sitting - Dynamic: Fair (occasional)  Standing: Impaired  Standing - Static: Fair  Strength:  Strength: Within functional limits   Tone & Sensation:  Tone: Normal  Sensation: Intact   Range of Motion:    AROM: Within functional limits  PROM: Within functional limits   Functional Mobility and Transfers for ADLs:  Bed Mobility:  Rolling: Additional time;Assist X1;CGA  Supine to Sit: CGA;Additional time;Assist X1;Verbal cues  Sit to Supine: Verbal cues;CGA;Assist X1;Additional time  Scooting: Additional time;Assist X1;Supervision  Transfers:  Sit to Stand: Additional time;Assist X1;CGA;Verbal cues     ADL  Lower Body Dressing: Moderate assistance    Pain:  Pain Scale 1: Numeric (  0 - 10)  Pain Intensity 1: 4  Pain Location 1: Head    Activity Tolerance:   fair  Please refer to the flowsheet for vital signs taken during this treatment.  After treatment:   [ ]  Patient left in no apparent distress sitting up in chair  [X]  Patient left in no apparent distress in bed  [ ]  Call bell left within reach  [ ]  Nursing notified  [ ]  Caregiver present  [ ]  Bed alarm activated      COMMUNICATION/EDUCATION:   [ ]  Home safety education was provided and the patient/caregiver indicated understanding.  [X]  Patient/family have participated as able in goal setting and plan of care.  [ ]  Patient/family agree to  work toward stated goals and plan of care.  [ ]  Patient understands intent and goals of therapy, but is neutral about his/her participation.  [ ]  Patient is unable to participate in goal setting and plan of care.    Thank you for this referral.  Luanne Bras, OTR/L  Time Calculation: 20 mins

## 2011-11-10 NOTE — Progress Notes (Signed)
Problem: Mobility Impaired (Adult and Pediatric)  Goal: *Acute Goals and Plan of Care (Insert Text)  Physical Therapy Goals  Initiated 11/10/2011 and to be accomplished within 5 day(s)  1. Patient will move from supine to sit and sit to supine , scoot up and down and roll side to side in bed with independence.   2. Patient will transfer from bed to chair and chair to bed with modified independence using the least restrictive device.  3. Patient will perform sit to stand with modified independence.  4. Patient will ambulate with modified independence for 200 feet with the least restrictive device.   PHYSICAL THERAPY EVALUATION    Patient: Cathy [name] (61 y.o. female)  Date: 11/10/2011  Primary Diagnosis: Brain mass  Brain mass  237.5  Procedure(s) (LRB):  CRANIOTOMY FOR TUMOR (Right) 2 Days Post-Op   Precautions:   Fall (pt. orthostatic on standing)      ASSESSMENT :   Based on the objective data described below, the patient presents with decreased activity tolerance, pt. Was unable to stand long enough for BP reading due to c/o dizziness.  Pt. With orthostasis    Patient will benefit from skilled intervention to address the above impairments.  Patient???s rehabilitation potential is considered to be Good  Factors which may influence rehabilitation potential include:   [X]          None noted  [ ]          Mental ability/status  [ ]          Medical condition  [ ]          Home/family situation and support systems  [ ]          Safety awareness  [ ]          Pain tolerance/management  [ ]          Other:        PLAN :   Recommendations and Planned Interventions:  [X]            Bed Mobility Training             [X]     Neuromuscular Re-Education  [X]            Transfer Training                   [ ]     Orthotic/Prosthetic Training  [X]            Gait Training                          [ ]     Modalities  [X]            Therapeutic Exercises          [ ]     Edema Management/Control  [X]            Therapeutic Activities             [X]     Patient and Family Training/Education  [ ]            Other (comment):    Frequency/Duration: Patient will be followed by physical therapy 1-2 times per day/4-7 days per week to address goals.  Discharge Recommendations: Home Health and Inpatient Rehab  Further Equipment Recommendations for Discharge: TBA       SUBJECTIVE:   Patient stated ???I am ready to get up.???      OBJECTIVE DATA SUMMARY:       Past Medical  History   Diagnosis Date   ??? 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       radioactive iodine ablation of thyroid   ??? Fibroid 06/23/2008   ??? HTN (hypertension) 06/23/2008   ??? HLD (hyperlipidemia)     ??? Breast mass, left         benign   ??? OSA (obstructive sleep apnea) 9/10   ??? Chronic systolic heart failure         Stable,    ??? Automatic implantable cardiac defibrillator in situ         Post ddd icd Set up carelink   ??? Hypotension, unspecified         Related to lisinopril     Past Surgical History   Procedure Date   ??? Hx tonsil and adenoidectomy     ??? Hx tubal ligation     ??? Hx total abdominal hysterectomy 1996   ??? Hx cholecystectomy     ??? Hx pacemaker         dual chamber icd     Barriers to Learning/Limitations: no  Compensate with: visual, verbal, tactile, kinesthetic cues/model    Prior Level of Function/Home Situation:   Home Situation  Home Environment: Private residence  # Steps to Enter: 1   One/Two Story Residence: One Engineer, materials Steps: 13   Height of Each Step (in): 6 inches  Interior Rails: Both  Lift Chair Available: No  Living Alone: No (Simultaneous filing. User may not have seen previous data.)  Support Systems: Child(ren);Family member(s);Spouse (Simultaneous filing. User may not have seen previous data.)  Patient Expects to be Discharged to:: Private residence  Current DME Used/Available at Home: None  Critical Behavior:  Neurologic State: Alert  Orientation Level: Oriented X4   Cognition: Appropriate decision making     Psychosocial  Patient Behaviors: Cooperative  Purposeful Interaction: Yes        Skin Integrity: Incision (comment);Intact  Skin Integumentary  Skin Integrity: Incision (comment);Intact     Strength:    Strength: Within functional limits   Tone & Sensation:   Tone: Normal   Sensation: Intact     Range Of Motion:  AROM: Within functional limits   PROM: Within functional limits   Functional Mobility:  Bed Mobility:  Rolling: Additional time;Assist X1;CGA  Supine to Sit: CGA;Additional time;Assist X1;Verbal cues  Sit to Supine: Verbal cues;CGA;Assist X1;Additional time  Scooting: Additional time;Assist X1;Supervision  Transfers:  Sit to Stand: Additional time;Assist X1;CGA;Verbal cues  Stand to Sit: CGA;Assist X1;Additional time;Verbal cues        Balance:   Sitting: Impaired;With support  Sitting - Static: Fair (occasional)  Sitting - Dynamic: Fair (occasional)  Standing: Impaired  Standing - Static: Fair  Ambulation/Gait Training:  Gait Description (WDL):  (pt. unable due to c/o dizziness.  stood only)    Pain:  Pain Scale 1: Numeric (0 - 10)  Pain Intensity 1: 4  Pain Location 1: Head   Activity Tolerance:   fair  Please refer to the flowsheet for vital signs taken during this treatment.  After treatment:   [X]          Patient left in no apparent distress sitting up in chair  [ ]          Patient left in no apparent distress in bed  [X]   Call bell left within reach  [X]          Nursing notified  [ ]          Caregiver present  [ ]          Bed alarm activated      COMMUNICATION/EDUCATION:   [X]          Fall prevention education was provided and the patient/caregiver indicated understanding.  [X]          Patient/family have participated as able in goal setting and plan of care.  [ ]          Patient/family agree to work toward stated goals and plan of care.  [ ]          Patient understands intent and goals of therapy, but is neutral about his/her participation.  [ ]           Patient is unable to participate in goal setting and plan of care.    Thank you for this referral.  Orest Dikes, PT   Time Calculation: 32 mins

## 2011-11-10 NOTE — Other (Signed)
TRANSFER - IN REPORT:    Verbal report received from [place], RN in [place]) on [place]  being received from ICU(unit) for routine progression of care      Report consisted of patient???s Situation, Background, Assessment and   Recommendations(SBAR).     Information from the following report(s) SBAR, Kardex, Intake/Output and Recent Results was reviewed with the receiving nurse.    Opportunity for questions and clarification was provided.      Assessment completed upon patient???s arrival to unit and care assumed.

## 2011-11-10 NOTE — Progress Notes (Signed)
Medicine Progress Note    Patient: Cathy [name]   Age:  60 y.o.  DOA: 11/04/2011   Admit Dx / CC: Brain mass  Brain mass  237.5  LOS:  LOS: 6 days     Assessment/Plan   Principal Problem:   *Brain tumor (benign) (11/04/2011)  Active Problems:     CHF (congestive heart failure) (06/23/2008)     Hypothyroid hx goiter (06/23/2008)     HTN (hypertension) (06/23/2008)     History of elevated lipids (06/23/2008)     OSA (obstructive sleep apnea) (11/13/2008)     AICD (automatic cardioverter/defibrillator) present (11/15/2010)   Implanted dual chamber medtronic AICD 11/15/10     Chronic systolic heart failure ()   Stable, no significant fluid overload   C/o sob on exertion, nyha class 2-3, feels better   Unable to take lisinopril due to low bp   Will continue coreg     Other primary cardiomyopathies ()   Non ischemic, pt was taken off coreg by pmd, possibly related to side    effect and low bp   Continue restart of coreg as pt has ef of 20%   i have discussed risk and benefit of icd implant with pt, she is agreeable    now   Pt off lisinopril due to lop bp, she does not want to try it again   Refer to dr seutter for icd         Additional Plan notes   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 Says feeling well, denies HA, CP, SOB, NV    Objective:     Visit Vitals   Item Reading   ??? BP 107/62   ??? Pulse 87   ??? Temp 96.8 ??F (36 ??C)   ??? Resp 18   ??? Ht 5\' 10"  (1.778 m)   ??? Wt 89.359 kg (197 lb)   ??? BMI 28.27 kg/m2   ??? SpO2 96%       Physical Exam:  General appearance: alert, cooperative, no distress, appears stated age  Head: wound with dressings  C/D/I   Neck: supple, trachea midline  Lungs: clear to auscultation  bilaterally  Heart: regular rate and rhythm, S1, S2 normal, no murmur, click, rub or gallop  Abdomen: soft, non-tender. Bowel sounds normal. No masses,  no organomegaly  Extremities: extremities normal, atraumatic, no cyanosis or edema  Skin: Skin color, texture, turgor normal. No rashes or lesions  Neurologic: Grossly normal    Intake and Output:  Current Shift:  09/16 0700 - 09/16 1859  In: 280 [P.O.:240; I.V.:40]  Out: 475 [Urine:475]  Last three shifts:  09/14 1900 - 09/16 0659  In: 1595 [P.O.:1340; I.V.:255]  Out: 2600 [Urine:2600]    Lab/Data Reviewed:  All lab results for the last 24 hours reviewed.    Medications Reviewed:  Current Facility-Administered Medications   Medication Dose Route Frequency   ??? dexamethasone (DECADRON) 4 mg/mL injection 4 mg  4 mg IntraVENous Q6H   ??? phenytoin (DILANTIN) injection 100 mg  100 mg IntraVENous Q8H   ??? sodium  chloride (NS) flush 10-30 mL  10-30 mL InterCATHeter PRN   ??? bacitracin 500 unit/gram packet 1 Packet  1 Packet Topical PRN   ??? carvedilol (COREG) tablet 6.25 mg  6.25 mg Oral BID WITH MEALS   ??? fluticasone (FLONASE) 50 mcg/actuation nasal spray 2 Spray  2 Spray Both Nostrils DAILY   ??? montelukast (SINGULAIR) tablet 10 mg  10 mg Oral QHS   ??? rosuvastatin (CRESTOR) tablet 5 mg  5 mg Oral QHS   ??? traMADol (ULTRAM) tablet 50 mg  50 mg Oral Q6H PRN   ??? sodium chloride (NS) flush 5-10 mL  5-10 mL IntraVENous Q8H   ??? sodium chloride (NS) flush 5-10 mL  5-10 mL IntraVENous PRN   ??? acetaminophen (TYLENOL) tablet 650 mg  650 mg Oral Q4H PRN   ??? HYDROmorphone (PF) (DILAUDID) injection 1 mg  1 mg IntraVENous Q4H PRN   ??? naloxone (NARCAN) injection 0.4 mg  0.4 mg IntraVENous PRN   ??? ondansetron (ZOFRAN) injection 4 mg  4 mg IntraVENous Q4H PRN   ??? ondansetron (ZOFRAN ODT) tablet 4 mg  4 mg Oral Q4H PRN       Kristie Cowman, MD  November 10, 2011

## 2011-11-11 LAB — METABOLIC PANEL, COMPREHENSIVE
A-G Ratio: 0.9 (ref 0.8–1.7)
ALT (SGPT): 135 U/L — ABNORMAL HIGH (ref 12.0–78.0)
AST (SGOT): 23 U/L (ref 15–37)
Albumin: 3.2 g/dL — ABNORMAL LOW (ref 3.4–5.0)
Alk. phosphatase: 156 U/L — ABNORMAL HIGH (ref 50–136)
Anion gap: 11 mmol/L (ref 3.0–18)
BUN/Creatinine ratio: 17 (ref 12–20)
BUN: 12 MG/DL (ref 7.0–18)
Bilirubin, total: 0.3 MG/DL (ref 0.2–1.0)
CO2: 28 MMOL/L (ref 21–32)
Calcium: 8.8 MG/DL (ref 8.5–10.1)
Chloride: 100 MMOL/L (ref 100–108)
Creatinine: 0.69 MG/DL (ref 0.6–1.3)
GFR est AA: >60 mL/min/{1.73_m2} (ref >60)
GFR est non-AA: >60 mL/min/{1.73_m2} (ref >60)
Globulin: 3.7 g/dL (ref 2.0–4.0)
Glucose: 134 MG/DL — ABNORMAL HIGH (ref 74–99)
Potassium: 4.1 MMOL/L (ref 3.5–5.5)
Protein, total: 6.9 g/dL (ref 6.4–8.2)
Sodium: 139 MMOL/L (ref 136–145)

## 2011-11-11 LAB — CBC WITH AUTOMATED DIFF
ABS. BASOPHILS: 0 10*3/uL (ref 0.0–0.06)
ABS. EOSINOPHILS: 0 10*3/uL (ref 0.0–0.4)
ABS. LYMPHOCYTES: 1.8 10*3/uL (ref 0.9–3.6)
ABS. MONOCYTES: 1.1 10*3/uL (ref 0.05–1.2)
ABS. NEUTROPHILS: 8.6 10*3/uL — ABNORMAL HIGH (ref 1.8–8.0)
BASOPHILS: 0 % (ref 0–2)
EOSINOPHILS: 0 % (ref 0–5)
HCT: 37 % (ref 35.0–45.0)
HGB: 12.4 g/dL (ref 12.0–16.0)
LYMPHOCYTES: 16 % — ABNORMAL LOW (ref 21–52)
MCH: 30.8 PG (ref 24.0–34.0)
MCHC: 33.5 g/dL (ref 31.0–37.0)
MCV: 91.8 FL (ref 74.0–97.0)
MONOCYTES: 10 % (ref 3–10)
MPV: 11 FL (ref 9.2–11.8)
NEUTROPHILS: 74 % — ABNORMAL HIGH (ref 40–73)
PLATELET: 189 10*3/uL (ref 135–420)
RBC: 4.03 M/uL — ABNORMAL LOW (ref 4.20–5.30)
RDW: 12.9 % (ref 11.6–14.5)
WBC: 11.6 10*3/uL (ref 4.6–13.2)

## 2011-11-11 LAB — TYPE & SCREEN
ABO/Rh(D): O POS
Antibody screen: NEGATIVE
Unit division: 0
Unit division: 0
Unit division: 0
Unit division: 0

## 2011-11-11 LAB — GLUCOSE, POC: Glucose (POC): 195 mg/dL — ABNORMAL HIGH (ref 70–110)

## 2011-11-11 LAB — MAGNESIUM: Magnesium: 2.1 MG/DL (ref 1.8–2.4)

## 2011-11-11 LAB — TYPE AND SCREEN
ABO/Rh: O POS
Antibody Screen: NEGATIVE
Unit Divison: 0
Unit Divison: 0
Unit Divison: 0
Unit Divison: 0

## 2011-11-11 MED ADMIN — fluticasone (FLONASE) 50 mcg/actuation nasal spray 2 Spray: NASAL | @ 14:00:00 | NDC 50383070016

## 2011-11-11 MED ADMIN — HYDROmorphone (PF) (DILAUDID) injection 1 mg: INTRAVENOUS | @ 10:00:00 | NDC 00409128331

## 2011-11-11 MED ADMIN — traMADol (ULTRAM) tablet 50 mg: ORAL | @ 17:00:00 | NDC 62584055911

## 2011-11-11 MED ADMIN — phenytoin (DILANTIN) injection 100 mg: INTRAVENOUS | @ 04:00:00 | NDC 00641255541

## 2011-11-11 MED ADMIN — dexamethasone (DECADRON) 4 mg/mL injection 4 mg: INTRAVENOUS | @ 04:00:00 | NDC 63323016501

## 2011-11-11 MED ADMIN — HYDROmorphone (PF) (DILAUDID) injection 1 mg: INTRAVENOUS | @ 01:00:00 | NDC 00409128331

## 2011-11-11 MED ADMIN — carvedilol (COREG) tablet 6.25 mg: ORAL | @ 14:00:00 | NDC 68084026211

## 2011-11-11 MED ADMIN — influenza vaccine 2013-14(3yr+)(PF) (FLUZONE,FLUARIX) injection 0.5 mL: INTRAMUSCULAR | @ 17:00:00 | NDC 49281001388

## 2011-11-11 MED ADMIN — montelukast (SINGULAIR) tablet 10 mg: ORAL | @ 02:00:00 | NDC 68084062011

## 2011-11-11 MED ADMIN — sodium chloride (NS) flush 5-10 mL: INTRAVENOUS | @ 10:00:00 | NDC 87701099893

## 2011-11-11 MED ADMIN — rosuvastatin (CRESTOR) tablet 5 mg: ORAL | @ 01:00:00 | NDC 00310075139

## 2011-11-11 MED ADMIN — dexamethasone (DECADRON) 4 mg/mL injection 4 mg: INTRAVENOUS | @ 10:00:00 | NDC 63323016501

## 2011-11-11 MED ADMIN — sodium chloride (NS) flush 5-10 mL: INTRAVENOUS | @ 02:00:00 | NDC 87701099893

## 2011-11-11 MED ADMIN — phenytoin (DILANTIN) injection 100 mg: INTRAVENOUS | @ 14:00:00 | NDC 00641255541

## 2011-11-11 MED FILL — FLUZONE 2013-2014(PF) 45 MCG (15 MCG X 3)/0.5 ML INTRAMUSCULAR SYRINGE: 45 mcg (15 mcg x 3)/0.5 mL | INTRAMUSCULAR | Qty: 0.5

## 2011-11-11 MED FILL — PHENYTOIN SODIUM 50 MG/ML IV: 50 mg/mL | INTRAVENOUS | Qty: 5

## 2011-11-11 MED FILL — HYDROMORPHONE (PF) 1 MG/ML IJ SOLN: 1 mg/mL | INTRAMUSCULAR | Qty: 1

## 2011-11-11 MED FILL — TRAMADOL 50 MG TAB: 50 mg | ORAL | Qty: 1

## 2011-11-11 MED FILL — DEXAMETHASONE SODIUM PHOSPHATE 4 MG/ML IJ SOLN: 4 mg/mL | INTRAMUSCULAR | Qty: 1

## 2011-11-11 MED FILL — CRESTOR 10 MG TABLET: 10 mg | ORAL | Qty: 1

## 2011-11-11 MED FILL — CARVEDILOL 6.25 MG TAB: 6.25 mg | ORAL | Qty: 1

## 2011-11-11 NOTE — Progress Notes (Incomplete)
0900 pt said she was happy to go home, no complaints at this time.  1120 discharge instruction given awaiting her ride and walker, verbalized understanding, right PICC line removed without problem, pressure dressing applied for 5 minutes.  1320 given flu vaccine, not wanting pneumonia vaccine, per pt will do it  In her primary MD  1430 pt husband in to pick her up, but  husband demanded for walker, bed, HHN and HH aide.  1545 requested place for above orders.  1740 been up walking in the room with her husband, waiting medical transport, no headache and no pain at this time.

## 2011-11-11 NOTE — Progress Notes (Signed)
Problem: Mobility Impaired (Adult and Pediatric)  Goal: *Acute Goals and Plan of Care (Insert Text)  Physical Therapy Goals  Initiated 11/10/2011 and to be accomplished within 5 day(s)  1. Patient will move from supine to sit and sit to supine , scoot up and down and roll side to side in bed with independence.   2. Patient will transfer from bed to chair and chair to bed with modified independence using the least restrictive device.  3. Patient will perform sit to stand with modified independence.  4. Patient will ambulate with modified independence for 200 feet with the least restrictive device.   PHYSICAL THERAPY TREATMENT    Patient: Cathy [name] (61 y.o. female)  Date: 11/11/2011  Diagnosis: Brain mass  Brain mass  237.5 Brain tumor (benign)  Procedure(s) (LRB):  CRANIOTOMY FOR TUMOR (Right) 3 Days Post-Op  Precautions: Fall (pt. orthostatic on standing)  Chart, physical therapy assessment, plan of care and goals were reviewed.      ASSESSMENT:   Pt up and seated at beginning of treatment and reporting no dizziness.   First gait training ambulated 80 ft, pt dizzy and required several minutes seated (O2 91%) for symptoms to abate.  Second trial pt reporting fewer symptoms than previous, able to ascend/descend 5 stairs and O2 98% after activity.  Progression toward goals:  [X]       Improving appropriately and progressing toward goals  [ ]       Improving slowly and progressing toward goals  [ ]       Not making progress toward goals and plan of care will be adjusted       PLAN:   Patient continues to benefit from skilled intervention to address the above impairments. Continue treatment per established plan of care.  Discharge Recommendations:  Home Health and Inpatient Rehab  Further Equipment Recommendations for Discharge:  N/A- pt has DME       SUBJECTIVE:   Patient stated ???I feel better today.???      OBJECTIVE DATA SUMMARY:   Critical Behavior:  Neurologic State: Alert  Orientation Level: Oriented X4   Cognition: Appropriate decision making  Functional Mobility Training:  Bed Mobility:  Sit to Supine: Independent  Transfers:  Sit to Stand: Additional time;CGA;Safety concerns;Verbal cues  Stand to Sit: Additional time;CGA;Safety concerns;Verbal cues  Balance:  Sitting: Impaired  Sitting - Static: Good (unsupported)  Sitting - Dynamic: Fair (occasional)  Standing: Impaired  Standing - Static: Fair  Standing - Dynamic : Fair  Ambulation/Gait Training:  Distance (ft): 80 Feet (ft) (X2)  Assistive Device: Gait belt;Walker, rolling  Ambulation - Level of Assistance: CGA;SBA  Gait Abnormalities: Antalgic;Path deviations;Trunk sway increased;Decreased step clearance  Base of Support: Widened  Speed/Cadence: Fluctuations  Step Length: Left shortened;Right shortened  Stairs:  Number of Stairs Trained: 5   Stairs - Level of Assistance: Contact guard assistance;Verbal cues;Safety concerns  Therapeutic Exercises:   Ankle pumps, LAQ  Pain:  Pain Scale 1: Numeric (0 - 10)  Pain Intensity 1: 0  Activity Tolerance:   Fair- pt limited by dizziness  Please refer to the flowsheet for vital signs taken during this treatment.  After treatment:   [ ]  Patient left in no apparent distress sitting up in chair  [X]  Patient left in no apparent distress in bed  [X]  Call bell left within reach  [ ]  Nursing notified  [ ]  Caregiver present  [ ]  Bed alarm activated      Liborio Nixon, GPTA  Time Calculation: 24 mins

## 2011-11-11 NOTE — Discharge Summary (Signed)
Discharge Summary    Patient: Cathy [name]               Sex: female          DOA: 2011-12-03         Date of Birth:  Jul 17, 1950      Age:  61 y.o.        LOS:  LOS: 7 days                Admit Date: Dec 03, 2011    Discharge Date: 11/11/2011    Admission Diagnoses: Brain mass  Brain mass  237.5    Discharge Diagnoses:    Problem List as of 11/11/2011  Date Reviewed: 12/03/2011        Codes Class Noted - Resolved    *Brain tumor (benign) (Chronic) 225.0  03-Dec-2011 - Present        AR (allergic rhinitis) 477.9  09/29/2011 - Present        Chronic systolic heart failure 428.22  Unknown - Present    Overview    Signed 05/31/2011 11:22 AM by Javier Docker     Stable, no significant fluid overload  C/o sob on exertion, nyha class 2-3, feels better  Unable to take lisinopril due to low bp  Will continue coreg          Other primary cardiomyopathies 425.4  Unknown - Present    Overview    Signed 05/31/2011 11:24 AM by Javier Docker     Non ischemic, pt was taken off coreg by pmd, possibly related to side effect and low bp  Continue restart of coreg as pt has ef of 20%  i have discussed risk and benefit of icd implant with pt, she is agreeable now  Pt off lisinopril due to lop bp, she does not want to try it again  Refer to dr seutter for icd            Automatic implantable cardiac defibrillator in situ V45.02  Unknown - Present    Overview    Signed 05/31/2011 11:25 AM by Javier Docker     Post ddd icd  Set up [facility]link          Hypotension, unspecified 458.9  Unknown - Present    Overview    Signed 05/31/2011 11:28 AM by Javier Docker     Stable now, off lisinopril          Other and unspecified hyperlipidemia 272.4  Unknown - Present    Overview    Signed 05/31/2011 11:28 AM by Javier Docker     On zocor, followed by pmd          AICD (automatic cardioverter/defibrillator) present V45.02  11/15/2010 - Present    Overview    Signed 11/15/2010  2:40 PM by Arletha Pili, MD     Implanted dual chamber medtronic AICD 11/15/10           Cardiomyopathy 425.4  10/17/2010 - Present        Cataract 366.9  12/01/2008 - Present        Gallstones 574.20  11/13/2008 - Present        Insomnia 780.52  11/13/2008 - Present        OSA (obstructive sleep apnea) 327.23  11/13/2008 - Present        CHF (congestive heart failure) 428.0  06/23/2008 - Present        Hypothyroid hx goiter 244.9  06/23/2008 - Present  Fibroid 218.9  06/23/2008 - Present        HTN (hypertension) 401.9  06/23/2008 - Present        History of elevated lipids V12.29  06/23/2008 - Present              Discharge Medications:     Current Discharge Medication List      START taking these medications    Details   phenytoin ER (DILANTIN ER) 100 mg ER capsule Take 1 Cap by mouth three (3) times daily.  Qty: 90 Cap, Refills: 1      dexamethasone (DECADRON) 2 mg tablet Taper;  4mg  po q 6hr x 4 doses, then 4mg  po q 8hrs x 3 doses, then 2mg  po q 6hrs x 4 doses, then 2mg  po q8hrs x3 doses, then 2mg  po q12hrs x 2 doses then 2mg  po daily x 2 doses then dc.  Qty: 25 Tab, Refills: 0         CONTINUE these medications which have CHANGED    Details   !! traMADol (ULTRAM) 50 mg tablet Take 1 Tab by mouth every six (6) hours as needed for Pain.  Qty: 45 Tab, Refills: 0    Associated Diagnoses: Leg pain       !! - Potential duplicate medications found. Please discuss with provider.      CONTINUE these medications which have NOT CHANGED    Details   !! traMADol (ULTRAM) 50 mg tablet Take 1 Tab by mouth every six (6) hours as needed for Pain.  Qty: 60 Tab, Refills: 2    Associated Diagnoses: Leg pain      fluticasone (FLONASE) 50 mcg/actuation nasal spray 2 Sprays by Both Nostrils route daily.  Qty: 1 Bottle, Refills: 5    Associated Diagnoses: Sinus headache; AR (allergic rhinitis)      rosuvastatin (CRESTOR) 5 mg tablet Take 1 Tab by mouth nightly.  Qty: 30 Tab, Refills: 5    Associated Diagnoses: High cholesterol      montelukast (SINGULAIR) 10 mg tablet Take 1 Tab by mouth nightly.  Qty: 30 Tab, Refills: 5     Associated Diagnoses: Wheezing; AR (allergic rhinitis)      carvedilol (COREG) 6.25 mg tablet TAKE ONE TABLET BY MOUTH TWICE DAILY  Qty: 60 Tab, Refills: 5       !! - Potential duplicate medications found. Please discuss with provider.      STOP taking these medications       ibuprofen (MOTRIN) 800 mg tablet Comments:   Reason for Stopping:         aspirin (ASPIRIN) 325 mg tablet Comments:   Reason for Stopping:               Follow-up:   PARTINGTON, JONATHAN P  Schedule an appointment as soon as possible for a visit in 2 weeks   [address]  [address]  [place] [address]  [phone]    Discharge Condition: Stable    Activity: activity as tolerated and no driving for 6 months     Diet: Cardiac Diet    Wound Care: Keep wound clean and dry    Labs:  Labs: Results:       Chemistry Recent Labs   [facility] 11/11/11 0700 11/10/11 0530 11/09/11 0315    GLU 134* 124* 155*    NA 139 139 142    K 4.1 4.0 4.3    CL 100 100 102    CO2  28 27 29     BUN 12 14 18     CREA 0.69 0.56* 0.70    CA 8.8 8.2* 7.5*    AGAP 11 12 11     BUCR 17 25* 26*    TBIL 0.3 0.4 0.4    GPT 135* 185* 274*    AP 156* 153* 163*    TP 6.9 6.1* 6.2*    ALB 3.2* 2.9* 3.1*    GLOB 3.7 3.2 3.1    AGRAT 0.9 0.9 1.0      CBC w/Diff Recent Labs   [facility] 11/11/11 0700 11/10/11 0530 11/09/11 0315    WBC 11.6 10.3 12.9    RBC 4.03* 3.95* 3.93*    HGB 12.4 11.8* 12.0    HCT 37.0 35.9 36.0    PLT 189 183 214    GRANS 74* 67 82*    LYMPH 16* 20* 8*    EOS 0 0 0      Cardiac Enzymes No results found for this basename: CPK:2,CKRMB:2,CKND1:2,TROIP:2,MYO:2 in the last 72 hours   Coagulation No results found for this basename: PTP:2,INR:2,APTT:2 in the last 72 hours    Lipid Panel Lab Results   Component Value Date/Time    Cholesterol, total 229 09/02/2011  9:07 AM    HDL Cholesterol 56 09/02/2011  9:07 AM    LDL, calculated 164 09/02/2011  9:07 AM    VLDL, calculated 9 09/02/2011  9:07 AM    Triglyceride 47 09/02/2011  9:07 AM    CHOL/HDL Ratio 3.4 11/23/2008  11:55 AM      BNP No results found for this basename: BNPP in the last 72 hours   Liver Enzymes Recent Labs   Basename 11/11/11 0700    TP 6.9    ALB 3.2*    TBIL 0.3    AP 156*    SGOT 23    GPT 135*      Thyroid Studies Lab Results   Component Value Date/Time    TSH 1.590 02/06/2010 11:48 AM          Imaging:  Ct head w/o contrast 9/10  .3 cm solid partially calcified mass in the right frontal lobe creating edema   and mass effect upon the anterior falx cerebri and frontal ventricles and 10 mm   of midline shift to the left. 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 Early developing dilation of the   left lateral ventricle likely from obstruction at the foramen of Monroe by   lesion mass effect. The location of the mass and edema raises the possibility   of developing or future subfalcine herniation.   Mild focus of edema at the lateral right frontal lobe that might indicate a   second small unseen small mass.      Consults: neurosurgery     Treatment Team: Treatment Team: Attending Provider: Jamse Arn, MD; Surgeon: Augustin Schooling, MD      Hospital Course:   Pt presented to [facility] with new onset seizures, Imaging revealed brain mass and pt was transferred to [facility] for neurosurgical evaluation.  On arrival Pt was started on dilantin for seizures which will be continued on discharge and decadron for cerebral edema seen on imaging. 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 Pt did well post op and will be d/c to home, with home health for PT/OT and  follow up with neurosurgery in 2 weeks for staple removal.       Kristie Cowman, MD  November 11, 2011

## 2011-11-11 NOTE — Progress Notes (Signed)
Problem: Self Care Deficits Care Plan (Adult)  Goal: *Acute Goals and Plan of Care (Insert Text)  Occupational Therapy Goals  Initiated 11/10/2011 within 7 day(s).    1. Patient will perform bathing with supervision/set-up  2. Patient will perform upper body dressing with supervision/set-up.  3. Patient will perform lower body dressing with minimal assistance/contact guard assist.  4. Patient will perform toilet transfers with modified independence.  5. Patient will perform all aspects of toileting with modified independence.  6. Patient will participate in upper extremity therapeutic exercise/activities with modified independence for 5 minutes.   7. Patient will utilize energy conservation techniques during functional activities with verbal cues.   OCCUPATIONAL THERAPY TREATMENT    Patient: Cathy [name] (61 y.o. female)  Date: 11/11/2011  Diagnosis: Brain mass  Brain mass  237.5 Brain tumor (benign)  Procedure(s) (LRB):  CRANIOTOMY FOR TUMOR (Right) 3 Days Post-Op  Precautions: Fall (pt. orthostatic on standing)  Chart, occupational therapy assessment, plan of care, and goals were reviewed.      ASSESSMENT:   Pt demo good safety and balance ambulating to bathroom and toilet transfer. Pt educated on benefits of medication organizer in preparation for d/c home.  Progression toward goals:  [X]           Improving appropriately and progressing toward goals  [ ]           Improving slowly and progressing toward goals  [ ]           Not making progress toward goals and plan of care will be adjusted       PLAN:   Patient continues to benefit from skilled intervention to address the above impairments. Continue treatment per established plan of care.  Discharge Recommendations:  Home Health  Further Equipment Recommendations for Discharge:  bedside commode and rolling walker       SUBJECTIVE:   Patient stated ???I'm waiting for my husband to bring me my clothes.???      OBJECTIVE DATA SUMMARY:   Cognitive/Behavioral Status:   Neurologic State: Alert  Orientation Level: Oriented X4  Cognition: Appropriate decision making;Appropriate for age attention/concentration;Appropriate safety awareness  Functional Mobility and Transfers for ADLs:              Bed Mobility:  Rolling: Additional time;Modified independence, requires equipment  Supine to Sit: Additional time;Modified independence, requires equipment (w/HOB raised and SR's)  Sit to Supine: Independent              Transfers:  Sit to Stand: Additional time;Supervision;Verbal cues              Toilet Transfer : Supervision/set up (w/grab bars)              Bathroom Mobility: Supervision/set up (w/RW)              Balance:  Sitting: Intact  Sitting - Static: Good (unsupported)  Sitting - Dynamic: Good (unsupported)  Standing: Intact (w/RW)  Standing - Static: Good  Standing - Dynamic : Fair (fair plus)  ADL Intervention:  Grooming  Washing Hands: Modified independent (at stand sinkside)    Pain:  Pain Scale 1: Numeric (0 - 10)  Pain Intensity 1: 0    Activity Tolerance:    good  Please refer to the flowsheet for vital signs taken during this treatment.  After treatment:   [ ]   Patient left in no apparent distress sitting up in chair  [X]   Patient left in no apparent distress seated edge  of bed  [X]   Call bell left within reach  [X]   Nursing notified  [ ]   Caregiver present  [ ]   Bed alarm activated    Leann  Dextradeur, COTA  Time Calculation: 25 mins

## 2011-11-11 NOTE — Progress Notes (Addendum)
Location: 2COSS - [id number]  Attn.: PFEFFER, YVETTE B  DOB: 07/08/1950 / Age: 61  MR#: [id number] / Admit#: [id number]  Pt. First Name: Cathy  Pt. Last Name: [name] [place], [place]  [address]  [address]  [facility]                        Case Management - Progress Note  Initial Open Date: 11/05/2011  Case Manager: Vickey Huger, RN    Initial Open Date:  Social Worker:  Expected Date of Discharge:  Transferred From:  ECF Bed Held Until:  Bed Held By:  Power of Attorney:  POA/Guardian/Conservator Capacity:  Primary Caregiver:  Living Arrangements:  Source of Income:  Payee:  Psychosocial History:  Cultural/Religious/Language Issues:  Education Level:  ADLS/Current Living Arrangements Issues:  Past Providers:  Will patient perform self care at discharge? Y  Anticipated Discharge Disposition Goal: Return to admission address  Assessment/Plan:   11/11/2011 02:04P Call from PJ at [facility] this morning.  She states that she can be reached at [phone] / Fax is [phone] if  patient has a SNF or acute rehab need. Per orders on chart patient is being  discharged to home today. JClerveau,RN      11/11/2011 11:29A  See notes in [facility].Tereso Newcomer.      11/07/2011 04:38P to follow for dc plan ; Plan is for ; Crani/tumor  resection in am. [facility] RN         11/05/2011 04:24P Spoke with patient & husband. Address verified PCP = Dr  Salli Quarry. Patient was independent prior to admission. Will need to see PT/OT  eval to determine D/C plan. Patient has a Government social research officer so  authorization to go to rehab or SNF D Research officer, political party  Resources at Discharge:  Service Providers at Discharge:  Dictating Provider:  Leana Gamer

## 2011-11-11 NOTE — Progress Notes (Signed)
Offered pt FOC for home care, she chose [facility].  Referral sent to intake via EDC and called to [facility]n [facility] for f/u.  I ordered her r/w from [employer], spoke with Carollee Herter, for hospital room delivery today before dc home.  I notified the pt and her nurse Tami Ribas, pt must wait for r/w delivery.

## 2011-11-11 NOTE — Other (Signed)
Bedside and Verbal shift change report given to inna gracheva rn  (oncoming nurse) by Charma Igo, RN   (offgoing nurse).  Report given with SBAR, Kardex, MAR and Recent Results.

## 2011-11-11 NOTE — Progress Notes (Signed)
Received new order for 3:1 for home use.  I ordered it from [facility] for hospital rm delivery today along with r/w.

## 2011-11-14 NOTE — Telephone Encounter (Signed)
Please call in Vicodin

## 2011-11-14 NOTE — Telephone Encounter (Signed)
Medication called as directed, left message with Lavonne medication called into pharmacy.

## 2011-11-14 NOTE — Telephone Encounter (Signed)
Home health calling and patient said the medication tramadol is causing night sweats and hallucinations.  Asking for vicodin instead.  Patient does know that Dr. Shawnie Pons is out of the office until next week. The home health nurse said that the patient had a brain tumor and does need some sort of pain medication.

## 2011-11-15 NOTE — Telephone Encounter (Signed)
Returned call from patient. She complains of difficulty sleeping at night. She says she took the Vicodin last night which did not help. I suggested trying Benadryl 25 mg at bedtime. She agreed.

## 2011-11-15 NOTE — Progress Notes (Signed)
Your patient visited the [employer] interactive preventive healthcare   record on 11/15/2011.    Below is a summary of your patient's information. Thank you for encouraging   your patients to  use [employer]!     INFORMATION YOUR PATIENT UPDATED:  Your patient did not update any information.    CURRENT HEALTH BEHAVIORS:  Your patient eats unknown serving(s) of fruits and vegetables per day.  Your patient exercises unknown time(s) per week.  Your patient does not smoke.  Your patient's body mass index (BMI) is 28.    CARE YOUR PATIENT MAY NEED:  Hyperlipidemia follow-up: Has coronary artery disease or an equilavent risk,   last LDL 164 on 02/07/2010  Diabetes screening: Last glucose was 134 on 11/11/2011  Tetanus vaccine: No record of a tetanus vaccine  Pneumonia vaccine: Has a pneumonia risk, no record of a pneumonia vaccine  Influenza vaccine: Last influenza vaccine was 12/19/2009

## 2011-11-15 NOTE — Telephone Encounter (Signed)
Returned call from patient with recording saying "circuit is busy, please call again".

## 2011-11-15 NOTE — Telephone Encounter (Signed)
Returning patient's call, no answer.

## 2011-11-17 NOTE — Telephone Encounter (Signed)
Pt is requesting ambien.  She tried the benadryl but she is still not able to go to sleep.  She was in the hospital this past week and she had brain tumor surgery.  They had her on tramadol; they had to take her off of that because it was making her miserable.  Pt was prescribed vicodin and it did take care of the pain.  But she is still getting up every hour to use the bathroom and not able to sleep even with benadryl.

## 2011-11-17 NOTE — Telephone Encounter (Signed)
Medication called in to [facility], patient is aware.

## 2011-11-17 NOTE — Telephone Encounter (Signed)
Medication was approved, you can call it in to the pharmacy and notify patient.

## 2011-11-19 LAB — AMB POC URINALYSIS DIP STICK MANUAL W/O MICRO
Bilirubin (UA POC): NEGATIVE
Glucose (UA POC): NEGATIVE
Ketones (UA POC): NEGATIVE
Nitrites (UA POC): NEGATIVE
Specific gravity (UA POC): 1.025 (ref 1.001–1.035)
Urobilinogen (UA POC): 0.2 (ref 0.2–1)
pH (UA POC): 5.5 (ref 4.6–8.0)

## 2011-11-19 NOTE — Progress Notes (Signed)
Patient is in the office today for hand pain from surgery.  Patient states she has head pain when she coughs and was told her head should not hurt.  Patient states she also has frequent urination and did not know if she should be checked for a UTI

## 2011-11-22 NOTE — Progress Notes (Signed)
Cathy [name] is a 61 y.o.  female and presents with Hand Pain, Insomnia, Urinary Frequency and Neck Pain      SUBJECTIVE:  Pt just had brain surgery. She is doing well overall but has some issues. She has been having increased urinary frquency for the last few days. Denies fever or chills. U/a is positive for LE. Pt has been having some neck pain and headaches especially if she coughs. She denies any fever or nausea or confusion. She is c/o some right hand pain. She has had insomnia since her surgery. It has improved to about 4 hrs sleep with Ambien 5 mg. Denies any excessive somnolence.       Respiratory ROS: negative for - shortness of breath  Cardiovascular ROS: negative for - chest pain    Current Outpatient Prescriptions   Medication Sig   ??? tiZANidine (ZANAFLEX) 4 mg tablet Take 1 Tab by mouth two (2) times a day.   ??? trimethoprim-sulfamethoxazole (BACTRIM DS, SEPTRA DS) 160-800 mg per tablet Take 1 Tab by mouth two (2) times a day for 3 days.   ??? zolpidem (AMBIEN) 5 mg tablet Take 1 Tab by mouth nightly as needed for Sleep.   ??? HYDROcodone-acetaminophen (NORCO) 7.5-325 mg per tablet Take 1 Tab by mouth every six (6) hours as needed for Pain.   ??? phenytoin ER (DILANTIN ER) 100 mg ER capsule Take 1 Cap by mouth three (3) times daily.   ??? fluticasone (FLONASE) 50 mcg/actuation nasal spray 2 Sprays by Both Nostrils route daily.   ??? rosuvastatin (CRESTOR) 5 mg tablet Take 1 Tab by mouth nightly.   ??? montelukast (SINGULAIR) 10 mg tablet Take 1 Tab by mouth nightly.   ??? carvedilol (COREG) 6.25 mg tablet TAKE ONE TABLET BY MOUTH TWICE DAILY   ??? traMADol (ULTRAM) 50 mg tablet Take 1 Tab by mouth every six (6) hours as needed for Pain.   ??? dexamethasone (DECADRON) 2 mg tablet Taper;  4mg  po q 6hr x 4 doses, then 4mg  po q 8hrs x 3 doses, then 2mg  po q 6hrs x 4 doses, then 2mg  po q8hrs x3 doses, then 2mg  po q12hrs x 2 doses then 2mg  po daily x 2 doses then dc.   ??? traMADol (ULTRAM) 50 mg tablet Take 1 Tab by mouth  every six (6) hours as needed for Pain.         OBJECTIVE:  alert, well appearing, and in no distress  BP 108/70   Pulse 87   Temp 98.7 ??F (37.1 ??C) (Oral)   Resp 18   Ht 5\' 10"  (1.778 m)   Wt 198 lb (89.812 kg)   BMI 28.41 kg/m2   SpO2 98%   well developed and well nourished  Musculoskeletal - decreased ROM of neck to muscle discomfort.     Discussed the patient's BMI with her.  The BMI follow up plan is as follows: Pt not eligible for BMI calculation due to acute illness.        Assessment/Plan    1. Neck strain  Will treat with tiZANidine (ZANAFLEX) 4 mg tablet prn    2. Insomnia  Improving. Will treat with increase Ambien to 10 mg QHS    3. UTI (lower urinary tract infection)  Will treat with trimethoprim-sulfamethoxazole (BACTRIM DS, SEPTRA DS) 160-800 mg per tablet, AMB POC URINALYSIS DIP STICK MANUAL W/O MICRO     Follow-up Disposition:  Return if symptoms worsen or fail to improve.    Reviewed plan  of care. Patient has provided input and agrees with goals.

## 2011-12-02 ENCOUNTER — Encounter

## 2011-12-11 NOTE — Telephone Encounter (Signed)
Patient scheduled to come in oct 18 @ 845am/ patient aware

## 2011-12-11 NOTE — Telephone Encounter (Signed)
Patient stated she had some Chest pain yesterday with increase heart rate to 116     Today she voiced her HR 118/ denies CP, SOB or edema at this time/  she is concerned about heart rate increasing      Please advised?

## 2011-12-11 NOTE — Telephone Encounter (Signed)
Get office visit

## 2011-12-12 NOTE — Progress Notes (Signed)
HISTORY OF PRESENT ILLNESS  Cathy [name] is a 61 y.o. female.    HPI Comments: Patient with recent neurological surgery,post procedure feels palpitations and occasional left sided chest pains.    Chest Pain (Angina)   The history is provided by the patient. This is a new problem. The problem has been resolved. The problem occurs rarely. The pain is present in the left side. Associated symptoms include palpitations and shortness of breath. Pertinent negatives include no abdominal pain, no claudication, no cough, no dizziness, no fever, no headaches, no hemoptysis, no nausea, no orthopnea, no PND, no sputum production, no vomiting and no weakness.   CHF  The history is provided by the patient. This is a chronic problem. The problem occurs constantly. The problem has not changed since onset.Associated symptoms include chest pain and shortness of breath. Pertinent negatives include no abdominal pain and no headaches.       Review of Systems   Constitutional: Negative for fever and chills.   HENT: Negative for nosebleeds.    Eyes: Negative for blurred vision and double vision.   Respiratory: Positive for shortness of breath. Negative for cough, hemoptysis, sputum production and wheezing.    Cardiovascular: Positive for chest pain and palpitations. Negative for orthopnea, claudication, leg swelling and PND.   Gastrointestinal: Negative for heartburn, nausea, vomiting and abdominal pain.   Musculoskeletal: Negative for myalgias.   Skin: Negative for rash.   Neurological: Negative for dizziness, weakness and headaches.   Endo/Heme/Allergies: Does not bruise/bleed easily.     Family History   Problem Relation Age of Onset   ??? Diabetes Mother    ??? Heart Attack Father      MI   ??? Heart Disease Maternal Grandmother    ??? Heart Disease Paternal Grandmother    ??? Kidney Disease Other      1 sib deceased   ??? Cancer Other      1 sib throat cancer   ??? Diabetes Other    ??? Diabetes Daughter    ??? Other Daughter      leukemia   ???  Hypertension Other        Past Medical History   Diagnosis Date   ??? CHF (congestive heart failure) 06/23/2008     Non-ischemic CMP , Cath (2008) Normal coronary arteries   ??? 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 Fibroid 06/23/2008   ??? HTN (hypertension) 06/23/2008   ??? HLD (hyperlipidemia)    ??? Breast mass, left      benign   ??? OSA (obstructive sleep apnea) 9/10   ??? Chronic systolic heart failure      Stable,    ??? Automatic implantable cardiac defibrillator in situ      Post ddd icd Set up carelink   ??? Hypotension, unspecified      Related to lisinopril       Past Surgical History   Procedure Laterality Date   ??? Hx tonsil and adenoidectomy     ??? Hx tubal ligation     ??? Hx total abdominal hysterectomy  1996   ??? Hx cholecystectomy     ??? Hx pacemaker       dual chamber icd       Allergies   Allergen Reactions   ??? Tramadol Other (comments)     Psychotic Hallucinations   ??? Pcn (Penicillins) Itching     Itching bumps on hands and arms  Current Outpatient Prescriptions   Medication Sig   ??? carvedilol (COREG) 12.5 mg tablet Take 1 Tab by mouth two (2) times a day.   ??? tiZANidine (ZANAFLEX) 4 mg tablet Take 1 Tab by mouth two (2) times a day.   ??? zolpidem (AMBIEN) 5 mg tablet Take 1 Tab by mouth nightly as needed for Sleep.   ??? HYDROcodone-acetaminophen (NORCO) 7.5-325 mg per tablet Take 1 Tab by mouth every six (6) hours as needed for Pain.   ??? phenytoin ER (DILANTIN ER) 100 mg ER capsule Take 1 Cap by mouth three (3) times daily.   ??? rosuvastatin (CRESTOR) 5 mg tablet Take 1 Tab by mouth nightly.     No current facility-administered medications for this visit.       BP 130/76   Pulse 117   Ht 5\' 10"  (1.778 m)   Wt 92.08 kg (203 lb)   BMI 29.13 kg/m2      Physical Exam   Constitutional: She is oriented to person, place, and time. She appears well-developed and well-nourished.   HENT:   Head: Normocephalic and atraumatic.   Eyes: Conjunctivae are normal.   Neck: Neck supple. No JVD  present. No tracheal deviation present. No thyromegaly present.   Cardiovascular: Normal rate and regular rhythm.  PMI is not displaced.  Exam reveals no gallop, no S3 and no decreased pulses.    No murmur heard.  Pulmonary/Chest: No respiratory distress. She has no wheezes. She has no rales. She exhibits no tenderness.   Abdominal: Soft. There is no tenderness.   Musculoskeletal: She exhibits no edema.   Neurological: She is alert and oriented to person, place, and time.   Skin: Skin is warm.   Psychiatric: She has a normal mood and affect.     CARDIOLOGY STUDIES 08/25/2010 10/25/2008 07/25/2008 01/25/2008 02/24/2006   Myocardial Perfusion Scan Result - - FIXED INF AND REVERSIBLE ANT APICAL DEFECT - -   Cardiac Cath Result - - - - EF 25%, NO SIGNIFICANT CAD   Echocardiogram - Complete Result DILATED 20%, MILD MR - - EF 25% -   Doppler US Arterial Result - NL SCAN - - -         Assessment    1. Chest pain, unspecified      atypical,likely muscular.non ischemic cmp   2. CHF (congestive heart failure)  AMB POC EKG ROUTINE W/ 12 LEADS, INTER & REP   3. Chronic systolic heart failure      stable   4. Cardiomyopathy     5. HTN (hypertension)     6. AICD (automatic cardioverter/defibrillator) present     7. Palpitations      periods of fast heart rate  has no significant arrythmia on transmission  increase coreg       Ms. Wurm has a reminder for a "due or due soon" health maintenance. I have asked that she contact her primary care provider for follow-up on this health maintenance.    CARDIOLOGY STUDIES 08/25/2010 10/25/2008 07/25/2008 01/25/2008 02/24/2006   Myocardial Perfusion Scan Result - - FIXED INF AND REVERSIBLE ANT APICAL DEFECT - -   Cardiac Cath Result - - - - EF 25%, NO SIGNIFICANT CAD   Echocardiogram - Complete Result DILATED 20%, MILD MR - - EF 25% -   Doppler US Arterial Result - NL SCAN - - -         Assessment    1. Chest pain, unspecified      atypical,likely  muscular.non ischemic cmp   2. CHF (congestive heart  failure)  AMB POC EKG ROUTINE W/ 12 LEADS, INTER & REP   3. Chronic systolic heart failure      stable   4. Cardiomyopathy     5. HTN (hypertension)     6. AICD (automatic cardioverter/defibrillator) present     7. Palpitations      periods of fast heart rate  has no significant arrythmia on transmission  increase coreg       Medications Discontinued During This Encounter   Medication Reason   ??? montelukast (SINGULAIR) 10 mg tablet Not A Current Medication   ??? fluticasone (FLONASE) 50 mcg/actuation nasal spray Not A Current Medication   ??? dexamethasone (DECADRON) 2 mg tablet Not A Current Medication   ??? traMADol (ULTRAM) 50 mg tablet Not A Current Medication   ??? traMADol (ULTRAM) 50 mg tablet Not A Current Medication   ??? carvedilol (COREG) 6.25 mg tablet Reorder       Orders Placed This Encounter   ??? AMB POC EKG ROUTINE W/ 12 LEADS, INTER & REP     Order Specific Question:  Reason for Exam:     Answer:  chest pain   ??? carvedilol (COREG) 12.5 mg tablet     Sig: Take 1 Tab by mouth two (2) times a day.     Dispense:  60 Tab     Refill:  5       Follow-up Disposition:  Return in about 6 months (around 06/11/2012).

## 2011-12-23 NOTE — Telephone Encounter (Signed)
Message copied by Rolm Bookbinder on Tue Dec 23, 2011 11:08 AM  ------       Message from: Luanne Bras       Created: Tue Dec 23, 2011  9:30 AM       Regarding: Non-Urgent Medical Question       Contact: [phone]         Will Dr Shawnie Pons be monitoring my anti-seizure (Dilantin) after My last visit with my surgeon? I am suppose to take it for a year.  ------

## 2011-12-26 NOTE — Telephone Encounter (Signed)
Please call regarding pt requesting referral for ear pain to Dr Elsie Ra.

## 2011-12-28 LAB — METABOLIC PANEL, COMPREHENSIVE
A-G Ratio: 1.9 (ref 1.1–2.5)
ALT (SGPT): 28 IU/L (ref 0–32)
AST (SGOT): 20 IU/L (ref 0–40)
Albumin: 4.1 g/dL (ref 3.6–4.8)
Alk. phosphatase: 155 IU/L — ABNORMAL HIGH (ref 47–112)
BUN/Creatinine ratio: 18 (ref 11–26)
BUN: 10 mg/dL (ref 8–27)
Bilirubin, total: 0.2 mg/dL (ref 0.0–1.2)
CO2: 24 mmol/L (ref 19–28)
Calcium: 8.7 mg/dL (ref 8.6–10.2)
Chloride: 104 mmol/L (ref 97–108)
Creatinine: 0.57 mg/dL (ref 0.57–1.00)
GFR est AA: 116 mL/min/{1.73_m2} (ref >59)
GFR est non-AA: 100 mL/min/{1.73_m2} (ref >59)
GLOBULIN, TOTAL: 2.2 g/dL (ref 1.5–4.5)
Glucose: 84 mg/dL (ref 65–99)
Potassium: 3.7 mmol/L (ref 3.5–5.2)
Protein, total: 6.3 g/dL (ref 6.0–8.5)
Sodium: 142 mmol/L (ref 134–144)

## 2011-12-28 LAB — LIPID PANEL
Cholesterol, total: 186 mg/dL (ref 100–199)
HDL Cholesterol: 67 mg/dL (ref >39)
LDL, calculated: 110 mg/dL — ABNORMAL HIGH (ref 0–99)
Triglyceride: 46 mg/dL (ref 0–149)
VLDL, calculated: 9 mg/dL (ref 5–40)

## 2011-12-28 LAB — CVD REPORT: PDF IMAGE: 0

## 2011-12-29 NOTE — Telephone Encounter (Signed)
Left message for patient her insurance does not referral.  Patient can call and schedule appointment with Dr. Elsie Ra.

## 2012-01-12 NOTE — Telephone Encounter (Signed)
Pt said that her surgeon (Dr. Marcelline Deist) used to prescribe this for her.  But she is no longer seeing the surgeon.

## 2012-01-21 NOTE — Telephone Encounter (Signed)
Pt had asked for ambien refill, but she said a prior authorization has to be done.  Phone: [phone]  Press 0 to talk to someone live

## 2012-01-22 NOTE — Progress Notes (Signed)
Cathy [name] is a 61 y.o.  female and presents with Hypertension, Cholesterol Problem and Insomnia      SUBJECTIVE:  Pt's HTN is well controlled on coreg. She denies any side effects. Her cholesterol is reasonably well controlled on Crestor. She denies any side effects such as myalgias. She will continue to try and improve it with weight loss. If unable consider increasing Crestor. Insomnia has improved with Ambien. She uses norco for occasional head pain from her recent brain surgery for meningioma.  She will continue on Dilatin for 1 year.        Respiratory ROS: negative for - shortness of breath  Cardiovascular ROS: negative for - chest pain    Current Outpatient Prescriptions   Medication Sig   ??? zolpidem (AMBIEN) 5 mg tablet Take 1 Tab by mouth nightly as needed for Sleep.   ??? HYDROcodone-acetaminophen (NORCO) 7.5-325 mg per tablet Take 1 Tab by mouth every six (6) hours as needed for Pain.   ??? phenytoin ER (DILANTIN ER) 100 mg ER capsule TAKE 1 CAPSULE BY MOUTH THREE TIMES DAILY   ??? carvedilol (COREG) 12.5 mg tablet Take 1 Tab by mouth two (2) times a day.   ??? tiZANidine (ZANAFLEX) 4 mg tablet Take 1 Tab by mouth two (2) times a day.   ??? rosuvastatin (CRESTOR) 5 mg tablet Take 1 Tab by mouth nightly.     No current facility-administered medications for this visit.         OBJECTIVE:  alert, well appearing, and in no distress  BP 104/82   Pulse 87   Temp(Src) 99 ??F (37.2 ??C) (Oral)   Resp 18   Ht 5\' 10"  (1.778 m)   Wt 203 lb (92.08 kg)   BMI 29.13 kg/m2   SpO2 97%   well developed and well nourished  Neck - supple, no significant adenopathy, carotids upstroke normal bilaterally, no bruits  Chest - clear to auscultation, no wheezes, rales or rhonchi, symmetric air entry  Heart - normal rate, regular rhythm, normal S1, S2, no murmurs, rubs, clicks or gallops  Extremities - peripheral pulses normal, no pedal edema, no clubbing or cyanosis  Skin - normal coloration and turgor, no rashes, no suspicious skin  lesions noted    Labs:   Lab Results   Component Value Date/Time    Cholesterol, total 186 12/26/2011  9:13 AM    HDL Cholesterol 67 12/26/2011  9:13 AM    LDL, calculated 110 12/26/2011  9:13 AM    Triglyceride 46 12/26/2011  9:13 AM    CHOL/HDL Ratio 3.4 11/23/2008 11:55 AM     Lab Results   Component Value Date/Time    ALT 28 12/26/2011  9:13 AM    AST 20 12/26/2011  9:13 AM    Alk. phosphatase 155 12/26/2011  9:13 AM    Bilirubin, direct 0.1 06/07/2008 10:46 AM    Bilirubin, total 0.2 12/26/2011  9:13 AM     Lab Results   Component Value Date/Time    GFR est AA 116 12/26/2011  9:13 AM    GFR est non-AA 100 12/26/2011  9:13 AM    Creatinine 0.57 12/26/2011  9:13 AM    BUN 10 12/26/2011  9:13 AM    Sodium 142 12/26/2011  9:13 AM    Potassium 3.7 12/26/2011  9:13 AM    Chloride 104 12/26/2011  9:13 AM    CO2 24 12/26/2011  9:13 AM        Discussed  the patient's BMI with her.  The BMI follow up plan is as follows: BMI is out of normal parameters and plan is as follows: I have counseled this patient on diet and exercise regimens.        Assessment/Plan    1. HTN (hypertension)  Well controlled on Coreg METABOLIC PANEL, COMPREHENSIVE, [id number] - [facility] COLLECTION, [id number] [facility] DRAW FEE   2. High cholesterol  Reasonably well controlled on Crestor LIPID PANEL   3. Insomnia  Improved on zolpidem (AMBIEN) 5 mg tablet   4. Meningioma  S/p resection. Will continue on Dilantin for 1 year. Continue HYDROcodone-acetaminophen (NORCO) 7.5-325 mg per tablet prn headaches      Follow-up Disposition:  Return in about 6 months (around 07/18/2012) for labs 1 week before .    Reviewed plan of care. Patient has provided input and agrees with goals.

## 2012-01-28 NOTE — Telephone Encounter (Signed)
Left message for patient Prior Authorization was received.

## 2012-01-29 ENCOUNTER — Encounter

## 2012-03-05 ENCOUNTER — Inpatient Hospital Stay
Admit: 2012-03-05 | Discharge: 2012-03-05 | Disposition: A | Discharge status: Home / Self Care | Payer: BLUE CROSS/BLUE SHIELD | Attending: Emergency Medicine

## 2012-03-05 LAB — CBC WITH AUTOMATED DIFF
ABS. BASOPHILS: 0 10*3/uL (ref 0.0–0.06)
ABS. EOSINOPHILS: 0 10*3/uL (ref 0.0–0.4)
ABS. LYMPHOCYTES: 0.9 10*3/uL (ref 0.9–3.6)
ABS. MONOCYTES: 0.4 10*3/uL (ref 0.05–1.2)
ABS. NEUTROPHILS: 5.6 10*3/uL (ref 1.8–8.0)
BASOPHILS: 0 % (ref 0–2)
EOSINOPHILS: 1 % (ref 0–5)
HCT: 35.1 % (ref 35.0–45.0)
HGB: 11.5 g/dL — ABNORMAL LOW (ref 12.0–16.0)
LYMPHOCYTES: 13 % — ABNORMAL LOW (ref 21–52)
MCH: 30.3 PG (ref 24.0–34.0)
MCHC: 32.8 g/dL (ref 31.0–37.0)
MCV: 92.6 FL (ref 74.0–97.0)
MONOCYTES: 6 % (ref 3–10)
MPV: 11.2 FL (ref 9.2–11.8)
NEUTROPHILS: 80 % — ABNORMAL HIGH (ref 40–73)
PLATELET: 187 10*3/uL (ref 135–420)
RBC: 3.79 M/uL — ABNORMAL LOW (ref 4.20–5.30)
RDW: 13.2 % (ref 11.6–14.5)
WBC: 6.9 10*3/uL (ref 4.6–13.2)

## 2012-03-05 LAB — CARDIAC PANEL,(CK, CKMB & TROPONIN)
CK - MB: <0.5 ng/ml — ABNORMAL LOW (ref 0.5–3.6)
CK - MB: 0.5 ng/ml (ref 0.5–3.6)
CK-MB Index: 1.7 % (ref 0.0–4.0)
CK: 34 U/L (ref 26–192)
CK: 29 U/L (ref 26–192)
Troponin-I, QT: <0.02 NG/ML (ref 0.00–0.06)
Troponin-I, QT: 0.02 NG/ML (ref 0.00–0.06)

## 2012-03-05 LAB — METABOLIC PANEL, BASIC
Anion gap: 10 mmol/L (ref 3.0–18)
BUN/Creatinine ratio: 18 (ref 12–20)
BUN: 14 MG/DL (ref 7.0–18)
CO2: 28 MMOL/L (ref 21–32)
Calcium: 8.4 MG/DL — ABNORMAL LOW (ref 8.5–10.1)
Chloride: 105 MMOL/L (ref 100–108)
Creatinine: 0.8 MG/DL (ref 0.6–1.3)
GFR est AA: >60 mL/min/{1.73_m2} (ref >60)
GFR est non-AA: >60 mL/min/{1.73_m2} (ref >60)
Glucose: 118 MG/DL — ABNORMAL HIGH (ref 74–99)
Potassium: 4.2 MMOL/L (ref 3.5–5.5)
Sodium: 143 MMOL/L (ref 136–145)

## 2012-03-05 LAB — EKG, 12 LEAD, INITIAL
Atrial Rate: 108 {beats}/min
Calculated P Axis: 61 degrees
Calculated R Axis: 12 degrees
Calculated T Axis: -167 degrees
P-R Interval: 166 ms
Q-T Interval: 358 ms
QRS Duration: 108 ms
QTC Calculation (Bezet): 479 ms
Ventricular Rate: 108 {beats}/min

## 2012-03-05 LAB — NT-PRO BNP: NT pro-BNP: 4028 PG/ML — ABNORMAL HIGH (ref 0–900)

## 2012-03-05 LAB — PHENYTOIN: Phenytoin: 5.1 ug/mL — ABNORMAL LOW (ref 10.0–20)

## 2012-03-05 MED ADMIN — sodium chloride 0.9 % bolus infusion 1,000 mL: INTRAVENOUS | @ 09:00:00 | NDC 87701099893

## 2012-03-05 MED ADMIN — ondansetron (ZOFRAN) injection 4 mg: INTRAVENOUS | @ 09:00:00 | NDC 00641607801

## 2012-03-05 MED ADMIN — furosemide (LASIX) injection 40 mg: INTRAVENOUS | @ 09:00:00 | NDC 00409610204

## 2012-03-05 MED ADMIN — morphine injection 2 mg: INTRAVENOUS | @ 09:00:00 | NDC 00409189001

## 2012-03-05 MED FILL — FUROSEMIDE 10 MG/ML IJ SOLN: 10 mg/mL | INTRAMUSCULAR | Qty: 4

## 2012-03-05 MED FILL — ONDANSETRON HCL 4 MG/2 ML INTRAVENOUS SYRINGE: 4 mg/2 mL | INTRAVENOUS | Qty: 2

## 2012-03-05 MED FILL — SODIUM CHLORIDE 0.9 % IV: INTRAVENOUS | Qty: 1000

## 2012-03-05 MED FILL — DILANTIN INFATABS 50 MG CHEWABLE TABLET: 50 mg | ORAL | Qty: 10

## 2012-03-05 MED FILL — MORPHINE 2 MG/ML INJECTION: 2 mg/mL | INTRAMUSCULAR | Qty: 1

## 2012-03-05 NOTE — ED Notes (Signed)
Pt  Took her on Dilantin per Dr  Manson Passey,  I have reviewed discharge instructions with the patient.  The patient verbalized understanding. Pt  dc'd via ambulation with belongings. Patient armband removed and shredded.

## 2012-03-05 NOTE — ED Notes (Signed)
Chest pain since midnight with SOB (distant wheezes). Pain improved with Nitro

## 2012-03-05 NOTE — ED Notes (Signed)
Up to bedside commode again; feeling better. Pain now 2/10.

## 2012-03-05 NOTE — ED Notes (Signed)
Patient became nauseous with Morphine, MD aware. Up to bedside commode with mild assistance.

## 2012-03-05 NOTE — ED Notes (Signed)
Patient resting comfortably on stretcher with eyes closed. Husband at bedside. No s/s distress. Continues on NC. Up to bedside commode without assistance multiple times. Lights dimmed and call bell within reach. Will continue to monitor closely.

## 2012-03-05 NOTE — ED Notes (Signed)
Report given to Marie RN.

## 2012-03-05 NOTE — ED Notes (Signed)
I have reviewed discharge instructions with the patient.  The patient verbalized understanding. Pt  dc'd via ambulation with belongings. Patient armband removed and shredded.

## 2012-03-05 NOTE — ED Notes (Signed)
Pt resting in  Bed awaiting  Lab results denies pain at this time

## 2012-03-05 NOTE — Progress Notes (Signed)
Chaplain conducted an initial consultation and Spiritual Assessment for Cathy [name], who is a 62 y.o.,female. Patient???s Primary Language is: [place].   According to the patient???s EMR Religious Affiliation is: Other.     The reason the Patient came to the hospital is:   Patient Active Problem List    Diagnosis Date Noted   ??? Meningioma 01/22/2012   ??? High cholesterol 01/19/2012   ??? Brain tumor (benign) 11/04/2011   ??? AR (allergic rhinitis) 09/29/2011   ??? Chronic systolic heart failure    ??? Other primary cardiomyopathies    ??? Automatic implantable cardiac defibrillator in situ    ??? Hypotension, unspecified    ??? Other and unspecified hyperlipidemia    ??? AICD (automatic cardioverter/defibrillator) present 11/15/2010   ??? Cardiomyopathy 10/17/2010   ??? Cataract 12/01/2008   ??? Gallstones 11/13/2008   ??? Insomnia 11/13/2008   ??? OSA (obstructive sleep apnea) 11/13/2008   ??? CHF (congestive heart failure) 06/23/2008   ??? 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   ??? Fibroid 06/23/2008   ??? HTN (hypertension) 06/23/2008   ??? History of elevated lipids 06/23/2008        The Chaplain provided the following Interventions:  Initiated a relationship of care and support.   Explored issues of faith, belief, spirituality and religious/ritual needs while hospitalized.  Listened empathically.  Provided chaplaincy education.  Provided information about Spiritual Care Services.  Offered prayer and assurance of continued prayers on patients behalf.   Chart reviewed.    The following outcomes were achieved:  Patient shared limited information about both their medical narrative and spiritual journey/beliefs.  Patient processed feeling about current hospitalization.  Patient expressed gratitude for pastoral care visit.    Assessment:  Patient does not have any religious/cultural needs that will affect patient???s preferences in health care.  There are no further spiritual or religious issues which require intervention at this time.     Plan:   Chaplains will continue to follow and will provide pastoral care on an as needed/requested basis.  Chaplain recommends bedside caregivers page chaplain on duty if patient shows signs of acute spiritual or emotional distress.    Christopher L. Feliberto Harts Resident  Spiritual Care  [phone]

## 2012-03-05 NOTE — ED Provider Notes (Addendum)
[facility]      62 y.o. female with noted past medical history including CHF who presents to the emergency department with sob and cp.  Pt states that she went to bed feeling OK but then woke about midnight with sob and CP that continued.  EMS called and pt states cp improved with oxygen and 1 sl ntg.      Upon ED arrival, pt still sob (though this has improved) and has minimal left lower chest pain.      No other complaints.     Current Facility-Administered Medications   Medication Dose Route Frequency   ??? sodium chloride 0.9 % bolus infusion 1,000 mL  1,000 mL IntraVENous ONCE   ??? morphine injection 2 mg  2 mg IntraVENous NOW   ??? furosemide (LASIX) injection 40 mg  40 mg IntraVENous NOW     Current Outpatient Prescriptions   Medication Sig   ??? zolpidem (AMBIEN) 5 mg tablet Take 1 Tab by mouth nightly as needed for Sleep.   ??? HYDROcodone-acetaminophen (NORCO) 7.5-325 mg per tablet Take 1 Tab by mouth every six (6) hours as needed for Pain.   ??? phenytoin ER (DILANTIN ER) 100 mg ER capsule TAKE 1 CAPSULE BY MOUTH THREE TIMES DAILY   ??? carvedilol (COREG) 12.5 mg tablet Take 1 Tab by mouth two (2) times a day.   ??? tiZANidine (ZANAFLEX) 4 mg tablet Take 1 Tab by mouth two (2) times a day.   ??? rosuvastatin (CRESTOR) 5 mg tablet Take 1 Tab by mouth nightly.       Past Medical History   Diagnosis Date   ??? CHF (congestive heart failure) 06/23/2008     Non-ischemic CMP , Cath (2008) Normal coronary arteries   ??? 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 Fibroid 06/23/2008   ??? HTN (hypertension) 06/23/2008   ??? HLD (hyperlipidemia)    ??? Breast mass, left      benign   ??? OSA (obstructive sleep apnea) 9/10   ??? Chronic systolic heart failure      Stable,    ??? Automatic implantable cardiac defibrillator in situ      Post ddd icd Set up carelink   ??? Hypotension, unspecified      Related to lisinopril       Past Surgical History   Procedure Laterality Date   ??? Hx tonsil and  adenoidectomy     ??? Hx tubal ligation     ??? Hx total abdominal hysterectomy  1996   ??? Hx cholecystectomy     ??? Hx pacemaker       dual chamber icd       Family History   Problem Relation Age of Onset   ??? Diabetes Mother    ??? Heart Attack Father      MI   ??? Heart Disease Maternal Grandmother    ??? Heart Disease Paternal Grandmother    ??? Kidney Disease Other      1 sib deceased   ??? Cancer Other      1 sib throat cancer   ??? Diabetes Other    ??? Diabetes Daughter    ??? Other Daughter      leukemia   ??? Hypertension Other        History     Social History   ??? Marital Status: MARRIED     Spouse Name: N/A  Number of Children: N/A   ??? 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 Smoking status: Never Smoker    ??? Smokeless tobacco: Never Used   ??? Alcohol Use: No   ??? Drug Use: No   ??? Sexually Active: Not on file     Other Topics Concern   ??? Not on file     Social History Narrative   ??? No narrative on file       Allergies   Allergen Reactions   ??? Tramadol Other (comments)     Psychotic Hallucinations   ??? Pcn (Penicillins) Itching     Itching bumps on hands and arms       Patient's primary care provider (as noted in EPIC):  NOEL L HUNTE, MD    REVIEW OF SYSTEMS:  Constitutional:  Negative for diaphoresis.  HENT:  Negative for congestion.    Respiratory:  Negative for stridor.    Cardiovascular:  Negative for palpitations.  Gastrointestinal:  Negative for diarrhea.  Genitourinary:  Negative for flank pain.   Musculoskeletal:  Negative for back pain.   Skin:  Negative for pallor.   Neurological: Negative.  Negative for weakness.     There were no vitals taken for this visit.    Interpreted by ED Physician:  Cardiac Monitor Strip interpretation is minimal Sinus Tachycardia  About 110 bpm, No ST changes noted, NORMAL WIDTH QRS.  12 lead EKG interpreted by ED Physician:  BORDERLINE Sinus Tachycardia about 110 bpm, non-specific EKG.    PHYSICAL EXAM:    CONSTITUTIONAL:   Alert, in no apparent distress;  well developed;  well nourished.  HEAD:  Normocephalic, atraumatic.  EYES:  EOMI.  Non-icteric sclera.  Normal conjunctiva.  ENTM:  Nose:  no rhinorrhea.  Throat:  no erythema or exudate, mucous membranes moist.  NECK:  No JVD.  Supple    RESPIRATORY:  Bilateral lower half lung field rales, but good air movement.    CARDIOVASCULAR:  Regular rate and rhythm.  No murmurs, rubs, or gallops.  GI:  Normal bowel sounds, abdomen soft and non-tender.  No rebound or guarding.  BACK:  Non-tender.  UPPER EXT:  Normal inspection.  LOWER EXT:  No edema, no calf tenderness.  Distal pulses intact.  NEURO:  Moves all four extremities, and grossly normal motor exam.  SKIN:  No rashes;  Normal for age.  PSYCH:  Alert and normal affect.    DIFFERENTIAL DIAGNOSES/ MEDICAL DECISION MAKING:   Shortness of breath etiologies include chronic obstructive pulmonary disease (COPD), acute asthma exacerbation, congestive heart failure, pneumonia, acute bronchitis, pulmonary embolism, upper respiratory infection, cardiac event to include acute coronary syndrome, acute myocardial infarction or a combination of the above (ex URI on top of COPD thus causing respiratory distress).      Recent Results (from the past 12 hour(s))   EKG, 12 LEAD, INITIAL    Collection Time    03/05/12  3:32 AM       Result Value Range    Ventricular Rate 108      Atrial Rate 108      P-R Interval 166      QRS Duration 108      Q-T Interval 358      QTC Calculation (Bezet) 479      Calculated P Axis 61      Calculated R Axis 12  Calculated T Axis -167      Diagnosis        Value: Sinus tachycardia      Left ventricular hypertrophy with repolarization abnormality      Abnormal ECG      When compared with ECG of 04-Nov-2011 08:01,      No significant change was found   CBC WITH AUTOMATED DIFF    Collection Time    03/05/12  3:45 AM       Result Value Range    WBC 6.9  4.6 - 13.2 K/uL    RBC 3.79 (*) 4.20 - 5.30 M/uL    HGB 11.5 (*) 12.0 -  16.0 g/dL    HCT 29.5  28.4 - 13.2 %    MCV 92.6  74.0 - 97.0 FL    MCH 30.3  24.0 - 34.0 PG    MCHC 32.8  31.0 - 37.0 g/dL    RDW 44.0  10.2 - 72.5 %    PLATELET 187  135 - 420 K/uL    MPV 11.2  9.2 - 11.8 FL    NEUTROPHILS 80 (*) 40 - 73 %    LYMPHOCYTES 13 (*) 21 - 52 %    MONOCYTES 6  3 - 10 %    EOSINOPHILS 1  0 - 5 %    BASOPHILS 0  0 - 2 %    ABS. NEUTROPHILS 5.6  1.8 - 8.0 K/UL    ABS. LYMPHOCYTES 0.9  0.9 - 3.6 K/UL    ABS. MONOCYTES 0.4  0.05 - 1.2 K/UL    ABS. EOSINOPHILS 0.0  0.0 - 0.4 K/UL    ABS. BASOPHILS 0.0  0.0 - 0.06 K/UL    DF AUTOMATED     METABOLIC PANEL, BASIC    Collection Time    03/05/12  3:45 AM       Result Value Range    Sodium 143  136 - 145 MMOL/L    Potassium 4.2  3.5 - 5.5 MMOL/L    Chloride 105  100 - 108 MMOL/L    CO2 28  21 - 32 MMOL/L    Anion gap 10  3.0 - 18 mmol/L    Glucose 118 (*) 74 - 99 MG/DL    BUN 14  7.0 - 18 MG/DL    Creatinine 3.66  0.6 - 1.3 MG/DL    BUN/Creatinine ratio 18  12 - 20      GFR est AA >60  >60 ml/min/1.62m2    GFR est non-AA >60  >60 ml/min/1.37m2    Calcium 8.4 (*) 8.5 - 10.1 MG/DL   CARDIAC PANEL,(CK, CKMB & TROPONIN)    Collection Time    03/05/12  3:45 AM       Result Value Range    CK 34  26 - 192 U/L    CK - MB <0.5 (*) 0.5 - 3.6 ng/ml    CK-MB Index CANNOT BE CALCULATED  0.0 - 4.0 %    Troponin-I, Qt. <0.02  0.00 - 0.06 NG/ML   PRO-BNP    Collection Time    03/05/12  3:45 AM       Result Value Range    NT pro-BNP 4028 (*) 0 - 900 PG/ML     CXR:  Preliminary review of x-rays by ED Physician.  Interpretation of chest X-ray shows, no infiltrates, no pneumothorax, moderate CHF, AICD, cardiomegaly, no effusion.    ED course:  Lasix IV given in ED.  1st  set of cardiac enzymes normal.  Second set of cardiac enzymes ordered for 0745 hours.  Will continue to diurese pt in ED while awaiting 2nd set of cardiac enzymes.     IMPRESSION AND MEDICAL DECISION MAKING:  Based upon the patient's presentation with noted HPI and PE, along with the work up done in the  emergency department, I believe that the patient is having acute decompensated congestive heart failure.     I do believe that the patient can be discharged home and can follow up with primary doctor within the next few days.  Will increase patient???s diuretic dosing per the discharge instructions.      DIAGNOSIS:    1. Acute decompensated congestive heart failure.    SPECIFIC PATIENT INSTRUCTIONS FROM THE PHYSICIAN WHO TREATED YOU IN THE ER TODAY:  1. Return if any concerns or worsening of condition(s)  2. Lasix as prescribed for the next 2 days.   3. FOLLOW UP APPOINTMENT:  Your primary doctor in 2-4 days.      4:44 AM  I, Suzanna Obey, presented patient and ED course to oncoming ED physician Dr. Darlis Loan.    Patient will be signed out to the oncoming ED physician who will follow up on any pending labs and provide the appropriate patient disposition.  2nd set of cardiac enzymes at 0745 hours and reevaluation of pt's progress with diuresing pending at time signout.  If these are normal, to consider discharge home with above noted instructions.    Arlys John L. Westley Foots, M.D.  [facility] Board Certified Emergency Physician    Provider Attestation:  If a scribe was utilized in generation of this patient record, I personally performed the services described in the documentation, reviewed the documentation, as recorded by the scribe in my presence, and it accurately records the patient's history of presenting illness, review of systems, patient physical examination, and procedures performed by me as the attending physician.     Arlys John L. Westley Foots, M.D.  [facility] Board Certified Emergency Physician  03/05/2012.  6:55 AM    9:44 AM Patietn reassessed by me. Patient alert and oriented times 3 with no SOB. Chest is clear to auscultation.

## 2012-03-05 NOTE — ED Notes (Signed)
Portable xray performed.

## 2012-03-16 NOTE — Patient Instructions (Signed)
This is a reminder that you are "Due or Due Soon" for your health maintenance. Please follow up with your primary care provider for follow-up on your health maintenance.

## 2012-03-16 NOTE — Progress Notes (Signed)
HISTORY OF PRESENT ILLNESS  Cathy [name] is a 62 y.o. female.    Pacemaker Check  The history is provided by the patient. This is a chronic problem. Associated symptoms include shortness of breath. Pertinent negatives include no chest pain, no abdominal pain and no headaches.   Shortness of Breath  The history is provided by the patient. This is a recurrent problem. The problem occurs intermittently.The problem has been gradually improving. Pertinent negatives include no fever, no headaches, no cough, no sputum production, no hemoptysis, no wheezing, no PND, no orthopnea, no chest pain, no vomiting, no abdominal pain, no rash, no leg swelling and no claudication.   CHF  The history is provided by the patient. This is a chronic problem. The problem occurs constantly. The problem has been gradually worsening. Associated symptoms include shortness of breath. Pertinent negatives include no chest pain, no abdominal pain and no headaches.       Review of Systems   Constitutional: Negative for fever and chills.   HENT: Negative for nosebleeds.    Eyes: Negative for blurred vision and double vision.   Respiratory: Positive for shortness of breath. Negative for cough, hemoptysis, sputum production and wheezing.    Cardiovascular: Negative for chest pain, palpitations, orthopnea, claudication, leg swelling and PND.   Gastrointestinal: Negative for heartburn, nausea, vomiting and abdominal pain.   Musculoskeletal: Negative for myalgias.   Skin: Negative for rash.   Neurological: Negative for dizziness, weakness and headaches.   Endo/Heme/Allergies: Does not bruise/bleed easily.     Family History   Problem Relation Age of Onset   ??? Diabetes Mother    ??? Heart Attack Father      MI   ??? Heart Disease Maternal Grandmother    ??? Heart Disease Paternal Grandmother    ??? Kidney Disease Other      1 sib deceased   ??? Cancer Other      1 sib throat cancer   ??? Diabetes Other    ??? Diabetes Daughter    ??? Other Daughter      leukemia    ??? Hypertension Other        Past Medical History   Diagnosis Date   ??? CHF (congestive heart failure) 06/23/2008     Non-ischemic CMP , Cath (2008) Normal coronary arteries   ??? 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 Fibroid 06/23/2008   ??? HTN (hypertension) 06/23/2008   ??? HLD (hyperlipidemia)    ??? Breast mass, left      benign   ??? OSA (obstructive sleep apnea) 9/10   ??? Chronic systolic heart failure      Stable,    ??? Automatic implantable cardiac defibrillator in situ      Post ddd icd Set up carelink   ??? Hypotension, unspecified      Related to lisinopril       Past Surgical History   Procedure Laterality Date   ??? Hx tonsil and adenoidectomy     ??? Hx tubal ligation     ??? Hx total abdominal hysterectomy  1996   ??? Hx cholecystectomy     ??? Hx pacemaker       dual chamber icd       Allergies   Allergen Reactions   ??? Tramadol Other (comments)     Psychotic Hallucinations   ??? Pcn (Penicillins) Itching     Itching bumps on hands and arms  Current Outpatient Prescriptions   Medication Sig   ??? furosemide (LASIX) 40 mg tablet Take 1 Tab by mouth daily.   ??? CRESTOR 5 mg tablet TAKE ONE TABLET BY MOUTH NIGHTLY   ??? zolpidem (AMBIEN) 5 mg tablet Take 1 Tab by mouth nightly as needed for Sleep.   ??? phenytoin ER (DILANTIN ER) 100 mg ER capsule TAKE 1 CAPSULE BY MOUTH THREE TIMES DAILY   ??? carvedilol (COREG) 12.5 mg tablet Take 1 Tab by mouth two (2) times a day.   ??? tiZANidine (ZANAFLEX) 4 mg tablet Take 1 Tab by mouth two (2) times a day.     No current facility-administered medications for this visit.       There were no vitals taken for this visit.      Physical Exam   Constitutional: She is oriented to person, place, and time. She appears well-developed and well-nourished.   HENT:   Head: Normocephalic and atraumatic.   Eyes: Conjunctivae are normal.   Neck: Neck supple. No JVD present. No tracheal deviation present. No thyromegaly present.   Cardiovascular: Normal rate and regular  rhythm.  PMI is not displaced.  Exam reveals no gallop, no S3 and no decreased pulses.    No murmur heard.  Pulmonary/Chest: No respiratory distress. She has no wheezes. She has no rales. She exhibits no tenderness.   Abdominal: Soft. There is no tenderness.   Musculoskeletal: She exhibits no edema.   Neurological: She is alert and oriented to person, place, and time.   Skin: Skin is warm.   Psychiatric: She has a normal mood and affect.     CARDIOLOGY STUDIES 08/25/2010 10/25/2008 07/25/2008 01/25/2008 02/24/2006   Myocardial Perfusion Scan Result - - FIXED INF AND REVERSIBLE ANT APICAL DEFECT - -   Cardiac Cath Result - - - - EF 25%, NO SIGNIFICANT CAD   Echocardiogram - Complete Result DILATED 20%, MILD MR - - EF 25% -   Doppler US Arterial Result - NL SCAN - - -         Assessment    1. Acute on chronic systolic heart failure  SINGLE,DUAL,OR MULTIPLE LEAD IMPLANT CARD DEFIB SYST, IMPLANTABLE CARDIOVASULAR MONT SYST, METABOLIC PANEL, BASIC, METABOLIC PANEL, BASIC    occasional sob on exertion  not on lasix  periods of fluid build up on monitor   2. Automatic implantable cardiac defibrillator in situ  SINGLE,DUAL,OR MULTIPLE LEAD IMPLANT CARD DEFIB SYST, IMPLANTABLE CARDIOVASULAR MONT SYST   3. CHF (congestive heart failure)     4. Other primary cardiomyopathies         Ms. Palomo has a reminder for a "due or due soon" health maintenance. I have asked that she contact her primary care provider for follow-up on this health maintenance.    CARDIOLOGY STUDIES 08/25/2010 10/25/2008 07/25/2008 01/25/2008 02/24/2006   Myocardial Perfusion Scan Result - - FIXED INF AND REVERSIBLE ANT APICAL DEFECT - -   Cardiac Cath Result - - - - EF 25%, NO SIGNIFICANT CAD   Echocardiogram - Complete Result DILATED 20%, MILD MR - - EF 25% -   Doppler US Arterial Result - NL SCAN - - -         Assessment    1. Acute on chronic systolic heart failure  SINGLE,DUAL,OR MULTIPLE LEAD IMPLANT CARD DEFIB SYST, IMPLANTABLE CARDIOVASULAR MONT SYST, METABOLIC  PANEL, BASIC, METABOLIC PANEL, BASIC    occasional sob on exertion  not on lasix  periods of fluid build up on monitor  2. Automatic implantable cardiac defibrillator in situ  SINGLE,DUAL,OR MULTIPLE LEAD IMPLANT CARD DEFIB SYST, IMPLANTABLE CARDIOVASULAR MONT SYST   3. CHF (congestive heart failure)     4. Other primary cardiomyopathies         There are no discontinued medications.    Orders Placed This Encounter   ??? SINGLE,DUAL,OR MULTIPLE LEAD IMPLANT CARD DEFIB SYST   ??? IMPLANTABLE CARDIOVASULAR MONT SYST   ??? METABOLIC PANEL, BASIC     Standing Status: Future      Number of Occurrences: 1      Standing Expiration Date: 04/15/2012   ??? furosemide (LASIX) 40 mg tablet     Sig: Take 1 Tab by mouth daily.     Dispense:  30 Tab     Refill:  6       Follow-up Disposition:  Return in about 3 months (around 06/14/2012).

## 2012-04-01 NOTE — Progress Notes (Signed)
HISTORY OF PRESENT ILLNESS  Cathy [name] is a 62 y.o. female.  Cold Symptoms  The history is provided by the patient. This is a new problem. The current episode started 2 days ago. The problem occurs constantly. The cough is productive of sputum. Associated symptoms include chest pain, chills, headaches, rhinorrhea, sore throat, myalgias and shortness of breath. Pertinent negatives include no wheezing and no nausea. Treatments tried: Flonase. The treatment provided mild relief.       Review of Systems   Constitutional: Positive for chills.   HENT: Positive for sore throat and rhinorrhea.    Respiratory: Positive for shortness of breath. Negative for wheezing.    Cardiovascular: Positive for chest pain.   Gastrointestinal: Negative for nausea.   Musculoskeletal: Positive for myalgias.   Neurological: Positive for headaches.       Physical Exam   Vitals reviewed.  Constitutional: She appears well-developed and well-nourished.   HENT:   Right Ear: Tympanic membrane normal.   Left Ear: Tympanic membrane normal.   Nose: Mucosal edema present.   Mouth/Throat: Oropharynx is clear and moist and mucous membranes are normal.   Eyes: Conjunctivae and EOM are normal. Pupils are equal, round, and reactive to light.   Cardiovascular: Normal rate, regular rhythm and normal heart sounds.    Pulmonary/Chest: Effort normal and breath sounds normal.       ASSESSMENT and PLAN  1. Bronchitis  Will treat with azithromycin (ZITHROMAX) 250 mg tablet and Tussinonex      Reviewed plan of care. Patient has provided input and agrees with goals.

## 2012-04-01 NOTE — Telephone Encounter (Signed)
Medication was approved, you can call it in to the pharmacy and notify patient.

## 2012-04-01 NOTE — Telephone Encounter (Signed)
Patient states insurance will not pay for Cheratussin and wants to know if Tussionex can be called.

## 2012-04-01 NOTE — Patient Instructions (Signed)
A Healthy Lifestyle: After Your Visit  Your Care Instructions  A healthy lifestyle can help you feel good, stay at a healthy weight, and have plenty of energy for both work and play. A healthy lifestyle is something you can share with your whole family.  A healthy lifestyle also can lower your risk for serious health problems, such as high blood pressure, heart disease, and diabetes.  You can follow a few steps listed below to improve your health and the health of your family.  Follow-up care is a key part of your treatment and safety. Be sure to make and go to all appointments, and call your doctor if you are having problems. It???s also a good idea to know your test results and keep a list of the medicines you take.  How can you care for yourself at home?  ?? Do not eat too much sugar, fat, or fast foods. You can still have dessert and treats now and then. The goal is moderation.  ?? Start small to improve your eating habits. Pay attention to portion sizes, drink less juice and soda pop, and eat more fruits and vegetables.  ?? Eat a healthy amount of food. A 3-ounce serving of meat, for example, is about the size of a deck of cards. Fill the rest of your plate with vegetables and whole grains.  ?? Limit the amount of soda and sports drinks you have every day. Drink more water when you are thirsty.  ?? Eat at least 5 servings of fruits and vegetables every day. It may seem like a lot, but it is not hard to reach this goal. A serving or helping is 1 piece of fruit, 1 cup of vegetables, or 2 cups of leafy, raw vegetables. Have an apple or some carrot sticks as an afternoon snack instead of a candy bar. Try to have fruits and/or vegetables at every meal.  ?? Make exercise part of your daily routine. You may want to start with simple activities, such as walking, bicycling, or slow swimming. Try to be active 30 to 60 minutes every day. You do not need to do all 30 to 60 minutes all at once. For example, you can exercise 3  times a day for 10 or 20 minutes. Moderate exercise is safe for most people, but it is always a good idea to talk to your doctor before starting an exercise program.  ?? Keep moving. Mow the lawn, work in the garden, or clean your house. Take the stairs instead of the elevator at work.  ?? If you smoke, quit. People who smoke have an increased risk for heart attack, stroke, cancer, and other lung illnesses. Quitting is hard, but there are ways to boost your chance of quitting tobacco for good.  ?? Use nicotine gum, patches, or lozenges.  ?? Ask your doctor about stop-smoking programs and medicines.  ?? Keep trying.  In addition to reducing your risk of diseases in the future, you will notice some benefits soon after you stop using tobacco. If you have shortness of breath or asthma symptoms, they will likely get better within a few weeks after you quit.  ?? Limit how much alcohol you drink. Moderate amounts of alcohol (up to 2 drinks a day for men, 1 drink a day for women) are okay. But drinking too much can lead to liver problems, high blood pressure, and other health problems.  Family health  If you have a family, there are many things you can   do together to improve your health.  ?? Eat meals together as a family as often as possible.  ?? Eat healthy foods. This includes fruits, vegetables, lean meats and dairy, and whole grains.  ?? Include your family in your fitness plan. Most people think of activities such as jogging or tennis as the way to fitness, but there are many ways you and your family can be more active. Anything that makes you breathe hard and gets your heart pumping is exercise. Here are some tips:  ?? Walk to do errands or to take your child to school or the bus.  ?? Go for a family bike ride after dinner instead of watching TV.   Where can you learn more?   Go to http://www.healthwise.net/[facility]  Enter U807 in the search box to learn more about "A Healthy Lifestyle: After Your Visit."   ?? 2006-2013  [employer]. Care instructions adapted under license by [facility] (which disclaims liability or warranty for this information). This care instruction is for use with your licensed healthcare professional. If you have questions about a medical condition or this instruction, always ask your healthcare professional. [employer] disclaims any warranty or liability for your use of this information.  Content Version: 9.9.209917; Last Revised: April 15, 2011

## 2012-04-01 NOTE — Telephone Encounter (Signed)
Left message for patient medication sent to the pharmacy.  Medication called in as directed.

## 2012-04-01 NOTE — Progress Notes (Signed)
Patient is in the office today for  Productive cough with light green sputum, headache 8/10,  Chest and nasal congestion and sweats.

## 2012-06-07 LAB — METABOLIC PANEL, COMPREHENSIVE
A-G Ratio: 1.2 (ref 0.8–1.7)
ALT (SGPT): 80 U/L — ABNORMAL HIGH (ref 12.0–78.0)
AST (SGOT): 64 U/L — ABNORMAL HIGH (ref 15–37)
Albumin: 3.8 g/dL (ref 3.4–5.0)
Alk. phosphatase: 182 U/L — ABNORMAL HIGH (ref 45–117)
Anion gap: 1 mmol/L — ABNORMAL LOW (ref 3.0–18)
BUN/Creatinine ratio: 20 (ref 12–20)
BUN: 13 MG/DL (ref 7.0–18)
Bilirubin, total: 0.2 MG/DL (ref 0.2–1.0)
CO2: 32 mmol/L (ref 21–32)
Calcium: 8.7 MG/DL (ref 8.5–10.1)
Chloride: 108 mmol/L (ref 100–108)
Creatinine: 0.64 MG/DL (ref 0.6–1.3)
GFR est AA: >60 mL/min/{1.73_m2} (ref >60)
GFR est non-AA: >60 mL/min/{1.73_m2} (ref >60)
Globulin: 3.2 g/dL (ref 2.0–4.0)
Glucose: 109 mg/dL — ABNORMAL HIGH (ref 74–99)
Potassium: 3.8 mmol/L (ref 3.5–5.5)
Protein, total: 7 g/dL (ref 6.4–8.2)
Sodium: 141 mmol/L (ref 136–145)

## 2012-06-07 LAB — CBC WITH AUTOMATED DIFF
ABS. BASOPHILS: 0 10*3/uL (ref 0.0–0.06)
ABS. EOSINOPHILS: 0 10*3/uL (ref 0.0–0.4)
ABS. LYMPHOCYTES: 1.4 10*3/uL (ref 0.9–3.6)
ABS. MONOCYTES: 0.1 10*3/uL (ref 0.05–1.2)
ABS. NEUTROPHILS: 2.6 10*3/uL (ref 1.8–8.0)
BASOPHILS: 0 % (ref 0–2)
EOSINOPHILS: 1 % (ref 0–5)
HCT: 35.5 % (ref 35.0–45.0)
HGB: 12 g/dL (ref 12.0–16.0)
LYMPHOCYTES: 34 % (ref 21–52)
MCH: 31.1 PG (ref 24.0–34.0)
MCHC: 33.8 g/dL (ref 31.0–37.0)
MCV: 92 FL (ref 74.0–97.0)
MONOCYTES: 3 % (ref 3–10)
MPV: 11 FL (ref 9.2–11.8)
NEUTROPHILS: 62 % (ref 40–73)
PLATELET: 211 10*3/uL (ref 135–420)
RBC: 3.86 M/uL — ABNORMAL LOW (ref 4.20–5.30)
RDW: 13.7 % (ref 11.6–14.5)
WBC: 4.2 10*3/uL — ABNORMAL LOW (ref 4.6–13.2)

## 2012-06-07 LAB — NT-PRO BNP: NT pro-BNP: 3303 PG/ML — ABNORMAL HIGH (ref 0–900)

## 2012-06-07 MED ADMIN — albuterol (PROVENTIL VENTOLIN) nebulizer solution 2.5 mg: RESPIRATORY_TRACT | @ 23:00:00 | NDC 00487950101

## 2012-06-07 MED ADMIN — albuterol-ipratropium (DUO-NEB) 2.5 MG-0.5 MG/3 ML: RESPIRATORY_TRACT | @ 23:00:00 | NDC 00487020101

## 2012-06-07 MED ADMIN — albuterol-ipratropium (DUO-NEB) 2.5 MG-0.5 MG/3 ML: RESPIRATORY_TRACT | @ 22:00:00 | NDC 00487020101
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
## 2012-06-07 NOTE — ED Notes (Signed)
Patient up to the bathroom with out difficulty.

## 2012-06-07 NOTE — ED Notes (Signed)
EMS run sheet states patient received 40 mg IV lasix at 1742.

## 2012-06-07 NOTE — ED Notes (Signed)
Nitro paste found on pt, placed by EMS. Removed from pt.

## 2012-06-07 NOTE — ED Notes (Signed)
Patient tolerated orthostatic blood pressure.

## 2012-06-07 NOTE — ED Notes (Signed)
Discharge instructions given to patient. Patient signed and verbalized understanding discharge instructions. Patient left emergency department  With family, in good condition.   0 Prescriptions given.   Armband removed and shredded.

## 2012-06-07 NOTE — ED Notes (Signed)
Bedside shift change report given to Kellie Shropshire RN (oncoming nurse) by Bonne Dolores RN (offgoing nurse).  Report given with SBAR, ED Summary and MAR.

## 2012-06-07 NOTE — ED Notes (Signed)
Patient states she received Lasix per EMS.

## 2012-06-07 NOTE — ED Notes (Signed)
After second breathing tx, pt able to speak full clear sentences. Pt remains on 3LNC for comfort. Call bell at bedside. Awaiting further evaluation.

## 2012-06-07 NOTE — ED Provider Notes (Signed)
HPI Comments: 62 YO F presents to the ED C/O SOB. Pt started wheezing and felt SOB this evening while driving. PT states that she felt better after albuterol. Pt states that she takes Lasix PRN for her CHF but does not weight herself, she takes it when she feels congested at night. Pt states that her EF is 20%. Pt states that she was wheezing. Pt states that she has some right leg swelling. PT denies recent travel. Pt states that in the ED when she went to the restroom she felt weak "from my stomach to my head." Pt denies any chest pain and any other Sx or complaints.     The history is provided by the patient.        Past Medical History   Diagnosis Date   ??? CHF (congestive heart failure) 06/23/2008     Non-ischemic CMP , Cath (2008) Normal coronary arteries   ??? 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 Fibroid 06/23/2008   ??? HTN (hypertension) 06/23/2008   ??? HLD (hyperlipidemia)    ??? Breast mass, left      benign   ??? OSA (obstructive sleep apnea) 9/10   ??? Chronic systolic heart failure      Stable,    ??? Automatic implantable cardiac defibrillator in situ      Post ddd icd Set up carelink   ??? Hypotension, unspecified      Related to lisinopril        Past Surgical History   Procedure Laterality Date   ??? Hx tonsil and adenoidectomy     ??? Hx tubal ligation     ??? Hx total abdominal hysterectomy  1996   ??? Hx cholecystectomy     ??? Hx pacemaker       dual chamber icd         Family History   Problem Relation Age of Onset   ??? Diabetes Mother    ??? Heart Attack Father      MI   ??? Heart Disease Maternal Grandmother    ??? Heart Disease Paternal Grandmother    ??? Kidney Disease Other      1 sib deceased   ??? Cancer Other      1 sib throat cancer   ??? Diabetes Other    ??? Diabetes Daughter    ??? Other Daughter      leukemia   ??? Hypertension Other         History     Social History   ??? Marital Status: MARRIED     Spouse Name: N/A     Number of Children: N/A   ??? 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 Smoking status: Never Smoker    ??? Smokeless tobacco: Never Used   ??? Alcohol Use: No   ??? Drug Use: No   ??? Sexually Active: Not on file     Other Topics Concern   ??? Not on file     Social History Narrative   ??? No narrative on file                  ALLERGIES: Tramadol and Pcn      Review of Systems  Constitutional:  Denies malaise, fever, chills.   Head:  Denies injury.   Face:  Denies injury  or pain.   ENMT:  Denies sore throat.   Neck:  Denies injury or pain.   Chest:  Denies injury.   Cardiac:  Denies chest pain or palpitations.   Respiratory:  See HPI  GI/ABD:  Denies injury, pain, distention, nausea, vomiting, diarrhea.   GU:  Denies injury, pain, dysuria or urgency.   Back:  Denies injury or pain.   Pelvis:  Denies injury or pain.   Extremity/MS: See HPI.Marland Kitchen   Neuro: See HPI.   Skin: Denies injury, rash, itching or skin changes.    Filed Vitals:    06/07/12 1852 06/07/12 2006 06/07/12 2009 06/07/12 2011   BP:  104/64 110/66 100/81   Pulse:  98 99 98   Temp:       Resp:       SpO2: 94%  92% 92%            Physical Exam   Constitutional: She is oriented to person, place, and time. She appears well-developed and well-nourished.   HENT:   Head: Normocephalic and atraumatic.   Right Ear: External ear normal.   Left Ear: External ear normal.   Nose: Nose normal.   Mouth/Throat: Oropharynx is clear and moist.   Eyes: Conjunctivae and EOM are normal. Pupils are equal, round, and reactive to light.   Neck: Normal range of motion. Neck supple.   Cardiovascular: Regular rhythm, normal heart sounds and intact distal pulses.    Pulmonary/Chest: Effort normal and breath sounds normal. No respiratory distress. She has no wheezes. She has no rales. She exhibits no tenderness.   Abdominal: Soft. Bowel sounds are normal. She exhibits no distension and no mass. There is no tenderness. There is no rebound and no guarding.   Musculoskeletal: Normal range of motion. She  exhibits no edema.   Neurological: She is alert and oriented to person, place, and time.   Skin: Skin is warm and dry. No rash noted. No erythema.   Psychiatric: She has a normal mood and affect. Her behavior is normal. Judgment normal.        MDM     Differential Diagnosis; Clinical Impression; Plan:     Shortness of breath noted. DDx CHF, COPD, clot, infection, hemorrhage, other etiologies.      Amount and/or Complexity of Data Reviewed:   Clinical lab tests:  Ordered and reviewed  Tests in the radiology section of CPT??:  Ordered and reviewed  Risk of Significant Complications, Morbidity, and/or Mortality:   General Comments: Labs Reviewed  CBC WITH AUTOMATED DIFF - Abnormal; Notable for the following:      WBC                           4.2 (*)                RBC                           3.86 (*)            All other components within normal limits  METABOLIC PANEL, COMPREHENSIVE - Abnormal; Notable for the following:      Anion gap                     1 (*)                  Glucose  109 (*)                ALT                           80 (*)                 AST                           64 (*)                 Alk. phosphatase              182 (*)             All other components within normal limits  PRO-BNP - Abnormal; Notable for the following:      NT pro-BNP                    3303 (*)            All other components within normal limits  Favor CHF as the primary etiology   All questions answered and understood        Medications ordered:   Medications   furosemide (LASIX) injection 40 mg (0 mg IntraVENous Held 06/07/12 1918)   albuterol-ipratropium (DUO-NEB) 2.5 MG-0.5 MG/3 ML (3 mL Nebulization Given 06/07/12 1824)   albuterol-ipratropium (DUO-NEB) 2.5 MG-0.5 MG/3 ML (3 mL Nebulization Given 06/07/12 1840)   albuterol (PROVENTIL VENTOLIN) nebulizer solution 2.5 mg (2.5 mg Nebulization Given 06/07/12 1901)         Lab findings:  Labs Reviewed   CBC WITH AUTOMATED DIFF - Abnormal; Notable  for the following:     WBC 4.2 (*)     RBC 3.86 (*)     All other components within normal limits   METABOLIC PANEL, COMPREHENSIVE - Abnormal; Notable for the following:     Anion gap 1 (*)     Glucose 109 (*)     ALT 80 (*)     AST 64 (*)     Alk. phosphatase 182 (*)     All other components within normal limits   PRO-BNP - Abnormal; Notable for the following:     NT pro-BNP 3303 (*)     All other components within normal limits       EKG interpretation: NSR at 93 BPM, incomplete LBBB LVH, no STEMI, no significant change from 03/05/12.    X-Ray, CT or other radiology findings or impressions:  XR CHEST PORT    (Results Pending)   EKG, 12 LEAD, SUBSEQUENT    (Results Pending)   8:18 PM Chest x-ray read by Thomes Dinning, MD: consistent with CHF, defibrillator in place.     8:19 PM Patient is feeling better. Blood pressure did not change. Patient is ready for discharge.       Procedures    Scribe Attestation:   June 07, 2012 at 8:07 PM - Janet Berlin scribing for and in the presence of Dr.Sharbel Sahagun Nena Alexander, MD     Janet Berlin, Scribe      Provider Attestation:   I personally performed the services described in the documentation, reviewed the documentation, as recorded by the scribe in my presence, and it accurately and completely records my words and actions. June 07, 2012 at 9:09 PM - Thomes Dinning, MD    (PROVIDER ATTESTATION)

## 2012-06-09 LAB — EKG, 12 LEAD, SUBSEQUENT
Atrial Rate: 93 {beats}/min
Calculated P Axis: 1 degrees
Calculated R Axis: -11 degrees
Calculated T Axis: 147 degrees
Diagnosis: NORMAL
P-R Interval: 198 ms
Q-T Interval: 396 ms
QRS Duration: 108 ms
QTC Calculation (Bezet): 492 ms
Ventricular Rate: 93 {beats}/min

## 2012-06-10 NOTE — Patient Instructions (Signed)
A Healthy Lifestyle: After Your Visit  Your Care Instructions  A healthy lifestyle can help you feel good, stay at a healthy weight, and have plenty of energy for both work and play. A healthy lifestyle is something you can share with your whole family.  A healthy lifestyle also can lower your risk for serious health problems, such as high blood pressure, heart disease, and diabetes.  You can follow a few steps listed below to improve your health and the health of your family.  Follow-up care is a key part of your treatment and safety. Be sure to make and go to all appointments, and call your doctor if you are having problems. It???s also a good idea to know your test results and keep a list of the medicines you take.  How can you care for yourself at home?  ?? Do not eat too much sugar, fat, or fast foods. You can still have dessert and treats now and then. The goal is moderation.  ?? Start small to improve your eating habits. Pay attention to portion sizes, drink less juice and soda pop, and eat more fruits and vegetables.  ?? Eat a healthy amount of food. A 3-ounce serving of meat, for example, is about the size of a deck of cards. Fill the rest of your plate with vegetables and whole grains.  ?? Limit the amount of soda and sports drinks you have every day. Drink more water when you are thirsty.  ?? Eat at least 5 servings of fruits and vegetables every day. It may seem like a lot, but it is not hard to reach this goal. A serving or helping is 1 piece of fruit, 1 cup of vegetables, or 2 cups of leafy, raw vegetables. Have an apple or some carrot sticks as an afternoon snack instead of a candy bar. Try to have fruits and/or vegetables at every meal.  ?? Make exercise part of your daily routine. You may want to start with simple activities, such as walking, bicycling, or slow swimming. Try to be active 30 to 60 minutes every day. You do not need to do all 30 to 60 minutes all at once. For example, you can exercise 3  times a day for 10 or 20 minutes. Moderate exercise is safe for most people, but it is always a good idea to talk to your doctor before starting an exercise program.  ?? Keep moving. Mow the lawn, work in the garden, or clean your house. Take the stairs instead of the elevator at work.  ?? If you smoke, quit. People who smoke have an increased risk for heart attack, stroke, cancer, and other lung illnesses. Quitting is hard, but there are ways to boost your chance of quitting tobacco for good.  ?? Use nicotine gum, patches, or lozenges.  ?? Ask your doctor about stop-smoking programs and medicines.  ?? Keep trying.  In addition to reducing your risk of diseases in the future, you will notice some benefits soon after you stop using tobacco. If you have shortness of breath or asthma symptoms, they will likely get better within a few weeks after you quit.  ?? Limit how much alcohol you drink. Moderate amounts of alcohol (up to 2 drinks a day for men, 1 drink a day for women) are okay. But drinking too much can lead to liver problems, high blood pressure, and other health problems.  Family health  If you have a family, there are many things you can   do together to improve your health.  ?? Eat meals together as a family as often as possible.  ?? Eat healthy foods. This includes fruits, vegetables, lean meats and dairy, and whole grains.  ?? Include your family in your fitness plan. Most people think of activities such as jogging or tennis as the way to fitness, but there are many ways you and your family can be more active. Anything that makes you breathe hard and gets your heart pumping is exercise. Here are some tips:  ?? Walk to do errands or to take your child to school or the bus.  ?? Go for a family bike ride after dinner instead of watching TV.   Where can you learn more?   Go to http://www.healthwise.net/[facility]  Enter U807 in the search box to learn more about "A Healthy Lifestyle: After Your Visit."   ?? 2006-2014  [employer]. Care instructions adapted under license by [facility] (which disclaims liability or warranty for this information). This care instruction is for use with your licensed healthcare professional. If you have questions about a medical condition or this instruction, always ask your healthcare professional. Healthwise, Incorporated disclaims any warranty or liability for your use of this information.  Content Version: 10.0.270728; Last Revised: April 15, 2011

## 2012-06-10 NOTE — Progress Notes (Signed)
Patient is in the office today for a pre op clearance.  Dr. Ty Hilts  Right Cataract Removed on Jul 01, 2012 at  [facility].

## 2012-06-10 NOTE — Progress Notes (Signed)
Cathy [name] is a 62 y.o.  female and presents with Pre-op Exam, Cataract, CHF and Hypertension      SUBJECTIVE:  Cardiovascular Review:  The patient has hypertension and CHF.  Diet and Lifestyle: generally follows a low fat low cholesterol diet, exercises sporadically  Home BP Monitoring: is well controlled at home,   Pertinent ROS: taking medications as instructed, not taking medications regularly as instructed, no TIA's, no chest pain on exertion, no dyspnea on exertion, no swelling of ankles.     Pt was recently in ER with CHF exacerbation when she missed her Lasix for a few days. Since she has EF of 20% pt was told she cannot miss her medications even for one day. Pt is at low risk for low risk eye surgery    Respiratory ROS: negative for - shortness of breath  Cardiovascular ROS: negative for - chest pain    Current Outpatient Prescriptions   Medication Sig   ??? furosemide (LASIX) 40 mg tablet Take 1 Tab by mouth daily.   ??? CRESTOR 5 mg tablet TAKE ONE TABLET BY MOUTH NIGHTLY   ??? zolpidem (AMBIEN) 5 mg tablet Take 1 Tab by mouth nightly as needed for Sleep.   ??? phenytoin ER (DILANTIN ER) 100 mg ER capsule TAKE 1 CAPSULE BY MOUTH THREE TIMES DAILY   ??? carvedilol (COREG) 12.5 mg tablet Take 1 Tab by mouth two (2) times a day.   ??? tiZANidine (ZANAFLEX) 4 mg tablet Take 1 Tab by mouth two (2) times a day.     No current facility-administered medications for this visit.         OBJECTIVE:  alert, well appearing, and in no distress  BP 120/80   Pulse 109   Temp(Src) 98.3 ??F (36.8 ??C) (Oral)   Resp 20   Ht 5\' 10"  (1.778 m)   Wt 208 lb (94.348 kg)   BMI 29.84 kg/m2   SpO2 99%   well developed and well nourished  Eyes - pupils equal and reactive, extraocular eye movements intact  Neck - supple, no significant adenopathy, carotids upstroke normal bilaterally, no bruits  Chest - clear to auscultation, no wheezes, rales or rhonchi, symmetric air entry  Heart - normal rate, regular rhythm, normal S1, S2, no murmurs,  rubs, clicks or gallops  Extremities - peripheral pulses normal, no pedal edema, no clubbing or cyanosis  Skin - normal coloration and turgor, no rashes, no suspicious skin lesions noted        Discussed the patient's BMI with her.  The BMI follow up plan is as follows: BMI is out of normal parameters and plan is as follows: I have counseled this patient on diet and exercise regimens.        Assessment/Plan      ICD-9-CM   1. CHF (congestive heart failure)                      Stable on current meds  428.0   2. HTN (hypertension)                                      Stable on current meds. Pt to take Coreg AM of surgery with sip of water 401.9   3. Cataract  Pt medically cleared for eye surgery  366.9     Follow-up Disposition:  Return if symptoms worsen or fail to improve.    Reviewed plan of care. Patient has provided input and agrees with goals.

## 2012-06-25 ENCOUNTER — Encounter

## 2012-06-25 NOTE — Progress Notes (Signed)
HISTORY OF PRESENT ILLNESS  Cathy [name] is a 62 y.o. female.    HPI Comments: Patient with recent er visit with sob,feels better after discharge.    CHF  The history is provided by the patient. This is a chronic problem. The problem occurs constantly. The problem has been gradually worsening. Associated symptoms include shortness of breath.   Shortness of Breath  The history is provided by the patient. This is a recurrent problem. The problem occurs intermittently.The problem has been gradually improving. Pertinent negatives include no fever, no cough, no sputum production, no hemoptysis, no wheezing, no PND, no orthopnea, no vomiting, no rash, no leg swelling and no claudication.       Review of Systems   Constitutional: Negative for fever and chills.   HENT: Negative for nosebleeds.    Eyes: Negative for blurred vision and double vision.   Respiratory: Positive for shortness of breath. Negative for cough, hemoptysis, sputum production and wheezing.    Cardiovascular: Negative for palpitations, orthopnea, claudication, leg swelling and PND.   Gastrointestinal: Negative for heartburn, nausea and vomiting.   Musculoskeletal: Negative for myalgias.   Skin: Negative for rash.   Neurological: Negative for dizziness and weakness.   Endo/Heme/Allergies: Does not bruise/bleed easily.     Family History   Problem Relation Age of Onset   ??? Diabetes Mother    ??? Heart Attack Father      MI   ??? Heart Disease Maternal Grandmother    ??? Heart Disease Paternal Grandmother    ??? Kidney Disease Other      1 sib deceased   ??? Cancer Other      1 sib throat cancer   ??? Diabetes Other    ??? Diabetes Daughter    ??? Other Daughter      leukemia   ??? Hypertension Other        Past Medical History   Diagnosis Date   ??? CHF (congestive heart failure) 06/23/2008     Non-ischemic CMP , Cath (2008) Normal coronary arteries   ??? 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 Fibroid 06/23/2008   ??? HTN (hypertension)  06/23/2008   ??? HLD (hyperlipidemia)    ??? Breast mass, left      benign   ??? OSA (obstructive sleep apnea) 9/10   ??? Chronic systolic heart failure      Stable,    ??? Automatic implantable cardiac defibrillator in situ      Post ddd icd Set up carelink   ??? Hypotension, unspecified      Related to lisinopril       Past Surgical History   Procedure Laterality Date   ??? Hx tonsil and adenoidectomy     ??? Hx tubal ligation     ??? Hx total abdominal hysterectomy  1996   ??? Hx cholecystectomy     ??? Hx pacemaker       dual chamber icd   ??? Hx craniotomy  2013     meningioma        Allergies   Allergen Reactions   ??? Tramadol Other (comments)     Psychotic Hallucinations   ??? Pcn (Penicillins) Itching     Itching bumps on hands and arms       Current Outpatient Prescriptions   Medication Sig   ??? carvedilol (COREG) 12.5 mg tablet TAKE ONE TABLET BY MOUTH TWICE DAILY   ??? furosemide (LASIX) 40  mg tablet Take 1 Tab by mouth daily.   ??? CRESTOR 5 mg tablet TAKE ONE TABLET BY MOUTH NIGHTLY   ??? zolpidem (AMBIEN) 5 mg tablet Take 1 Tab by mouth nightly as needed for Sleep.   ??? phenytoin ER (DILANTIN ER) 100 mg ER capsule TAKE 1 CAPSULE BY MOUTH THREE TIMES DAILY   ??? tiZANidine (ZANAFLEX) 4 mg tablet Take 1 Tab by mouth two (2) times a day.     No current facility-administered medications for this visit.       BP 149/101   Pulse 60   Ht 5\' 10"  (1.778 m)   Wt 89.812 kg (198 lb)   BMI 28.41 kg/m2      Physical Exam   Constitutional: She is oriented to person, place, and time. She appears well-developed and well-nourished.   HENT:   Head: Normocephalic and atraumatic.   Eyes: Conjunctivae are normal.   Neck: Neck supple. No JVD present. No tracheal deviation present. No thyromegaly present.   Cardiovascular: Normal rate and regular rhythm.  PMI is not displaced.  Exam reveals no gallop, no S3 and no decreased pulses.    No murmur heard.  Pulmonary/Chest: No respiratory distress. She has no wheezes. She has no rales. She exhibits no tenderness.    Abdominal: Soft. There is no tenderness.   Musculoskeletal: She exhibits no edema.   Neurological: She is alert and oriented to person, place, and time.   Skin: Skin is warm.   Psychiatric: She has a normal mood and affect.     CARDIOLOGY STUDIES 08/25/2010 10/25/2008 07/25/2008 01/25/2008 02/24/2006   Myocardial Perfusion Scan Result - - FIXED INF AND REVERSIBLE ANT APICAL DEFECT - -   Cardiac Cath Result - - - - EF 25%, NO SIGNIFICANT CAD   Echocardiogram - Complete Result DILATED 20%, MILD MR - - EF 25% -   Doppler US Arterial Result - NL SCAN - - -         Assessment      ICD-9-CM   1. Chronic systolic heart failure 428.22    stable  nyha class 2-3   2. Cardiomyopathy 425.4    non ischemic  stable   3. HTN (hypertension) 401.9    elevated today  usually normal at home   4. Automatic implantable cardiac defibrillator in situ V45.02    normal function   5. Other and unspecified hyperlipidemia 272.4    f/u per pmd       Cathy [name] has a reminder for a "due or due soon" health maintenance. I have asked that she contact her primary care provider for follow-up on this health maintenance.    CARDIOLOGY STUDIES 08/25/2010 10/25/2008 07/25/2008 01/25/2008 02/24/2006   Myocardial Perfusion Scan Result - - FIXED INF AND REVERSIBLE ANT APICAL DEFECT - -   Cardiac Cath Result - - - - EF 25%, NO SIGNIFICANT CAD   Echocardiogram - Complete Result DILATED 20%, MILD MR - - EF 25% -   Doppler US Arterial Result - NL SCAN - - -         Assessment      ICD-9-CM   1. Chronic systolic heart failure 428.22    stable  nyha class 2-3   2. Cardiomyopathy 425.4    non ischemic  stable   3. HTN (hypertension) 401.9    elevated today  usually normal at home   4. Automatic implantable cardiac defibrillator in situ V45.02    normal function   5.  Other and unspecified hyperlipidemia 272.4    f/u per pmd       There are no discontinued medications.    No orders of the defined types were placed in this encounter.       Follow-up Disposition:  Return in about 6  months (around 12/26/2012).

## 2012-07-05 NOTE — Telephone Encounter (Signed)
Patient is aware prescription is ready for pick up at the front office.

## 2012-07-06 LAB — METABOLIC PANEL, COMPREHENSIVE
A-G Ratio: 2.2 (ref 1.1–2.5)
ALT (SGPT): 18 IU/L (ref 0–32)
AST (SGOT): 11 IU/L (ref 0–40)
Albumin: 4.2 g/dL (ref 3.6–4.8)
Alk. phosphatase: 143 IU/L — ABNORMAL HIGH (ref 39–117)
BUN/Creatinine ratio: 20 (ref 11–26)
BUN: 14 mg/dL (ref 8–27)
Bilirubin, total: 0.1 mg/dL (ref 0.0–1.2)
CO2: 28 mmol/L (ref 19–28)
Calcium: 9 mg/dL (ref 8.6–10.2)
Chloride: 104 mmol/L (ref 97–108)
Creatinine: 0.69 mg/dL (ref 0.57–1.00)
GFR est AA: 108 mL/min/{1.73_m2} (ref >59)
GFR est non-AA: 94 mL/min/{1.73_m2} (ref >59)
GLOBULIN, TOTAL: 1.9 g/dL (ref 1.5–4.5)
Glucose: 87 mg/dL (ref 65–99)
Potassium: 4.5 mmol/L (ref 3.5–5.2)
Protein, total: 6.1 g/dL (ref 6.0–8.5)
Sodium: 143 mmol/L (ref 134–144)

## 2012-07-06 LAB — LIPID PANEL
Cholesterol, total: 191 mg/dL (ref 100–199)
HDL Cholesterol: 66 mg/dL (ref >39)
LDL, calculated: 115 mg/dL — ABNORMAL HIGH (ref 0–99)
Triglyceride: 50 mg/dL (ref 0–149)
VLDL, calculated: 10 mg/dL (ref 5–40)

## 2012-07-12 NOTE — Patient Instructions (Signed)
DASH Diet: After Your Visit  Your Care Instructions  The DASH diet is an eating plan that can help lower your blood pressure. DASH stands for Dietary Approaches to Stop Hypertension. Hypertension is high blood pressure.  The DASH diet focuses on eating foods that are high in calcium, potassium, and magnesium. These nutrients can lower blood pressure. The foods that are highest in these nutrients are fruits, vegetables, low-fat dairy products, nuts, seeds, and legumes. But taking calcium, potassium, and magnesium supplements instead of eating foods that are high in those nutrients does not have the same effect. The DASH diet also includes whole grains, fish, and poultry.  The DASH diet is one of several lifestyle changes your doctor may recommend to lower your high blood pressure. Your doctor may also want you to decrease the amount of sodium in your diet. Lowering sodium while following the DASH diet can lower blood pressure even further than just the DASH diet alone.  Follow-up care is a key part of your treatment and safety. Be sure to make and go to all appointments, and call your doctor if you are having problems. It's also a good idea to know your test results and keep a list of the medicines you take.  How can you care for yourself at home?  Following the DASH diet  ?? Eat 4 to 5 servings of fruit each day. A serving is 1 medium-sized piece of fruit, ?? cup chopped or canned fruit, 1/4 cup dried fruit, or 4 ounces (?? cup) of fruit juice. Choose fruit more often than fruit juice.  ?? Eat 4 to 5 servings of vegetables each day. A serving is 1 cup of lettuce or raw leafy vegetables, ?? cup of chopped or cooked vegetables, or 4 ounces (?? cup) of vegetable juice. Choose vegetables more often than vegetable juice.  ?? Get 2 to 3 servings of low-fat and fat-free dairy each day. A serving is 8 ounces of milk, 1 cup of yogurt, or 1 ?? ounces of cheese.  ?? Eat 6 to 8 servings of grains each day. A serving is 1 slice of  bread, 1 ounce of dry cereal, or ?? cup of cooked rice, pasta, or cooked cereal. Try to choose whole-grain products as much as possible.  ?? Limit lean meat, poultry, and fish to 2 servings each day. A serving is 3 ounces, about the size of a deck of cards.  ?? Eat 4 to 5 servings of nuts, seeds, and legumes (cooked dried beans, lentils, and split peas) each week. A serving is 1/3 cup of nuts, 2 tablespoons of seeds, or ?? cup cooked dried beans or peas.  ?? Limit fats and oils to 2 to 3 servings each day. A serving is 1 teaspoon of vegetable oil or 2 tablespoons of salad dressing.  ?? Limit sweets and added sugars to 5 servings or less a week. A serving is 1 tablespoon jelly or jam, ?? cup sorbet, or 1 cup of lemonade.  ?? Eat less than 2,300 milligrams (mg) of sodium a day. If you have high blood pressure, diabetes, or chronic kidney disease, if you are African-American, or if you are older than age 50, try to limit the amount of sodium you eat to less than 1,500 mg a day.  Tips for success  ?? Start small. Do not try to make dramatic changes to your diet all at once. You might feel that you are missing out on your favorite foods and then be more   likely to not follow the plan. Make small changes, and stick with them. Once those changes become habit, add a few more changes.  ?? Try some of the following:  ?? Make it a goal to eat a fruit or vegetable at every meal and at snacks. This will make it easy to get the recommended amount of fruits and vegetables each day.  ?? Try yogurt topped with fruit and nuts for a snack or healthy dessert.  ?? Add lettuce, tomato, cucumber, and onion to sandwiches.  ?? Combine a ready-made pizza crust with low-fat mozzarella cheese and lots of vegetable toppings. Try using tomatoes, squash, spinach, broccoli, carrots, cauliflower, and onions.  ?? Have a variety of cut-up vegetables with a low-fat dip as an appetizer instead of chips and dip.  ?? Sprinkle sunflower seeds or chopped almonds over  salads. Or try adding chopped walnuts or almonds to cooked vegetables.  ?? Try some vegetarian meals using beans and peas. Add garbanzo or kidney beans to salads. Make burritos and tacos with mashed pinto beans or black beans.   Where can you learn more?   Go to http://www.healthwise.net/[facility]  Enter H967 in the search box to learn more about "DASH Diet: After Your Visit."   ?? 2006-2014 Healthwise, Incorporated. Care instructions adapted under license by [place] (which disclaims liability or warranty for this information). This care instruction is for use with your licensed healthcare professional. If you have questions about a medical condition or this instruction, always ask your healthcare professional. Healthwise, Incorporated disclaims any warranty or liability for your use of this information.  Content Version: 10.0.270728; Last Revised: Jul 02, 2011

## 2012-07-12 NOTE — Progress Notes (Signed)
Patient is in the office today for 6 month follow up.

## 2012-07-14 NOTE — Progress Notes (Signed)
Subjective:       Chief Complaint  The patient presents for follow up of hypertension and high cholesterol. CHF         HPI  Cathy [name] is a 62 y.o. female seen for follow up of hyperlipidemia.  Shealso has hypertension. Hypertension well controlled, no significant medication side effects noted, on Coreg, hyperlipidemia reasonably well controlled, no significant medication side effects noted, on Crestor. Pt has had difficulty tolerating other statins in the past    Diet and Lifestyle: generally follows a low fat low cholesterol diet    Home BP Monitoring: is not measured at home.    Other symptoms and concerns: Cardiovascular Review:  The patient has CHF.  Diet and Lifestyle: generally follows a low fat low cholesterol diet, exercises sporadically  Home BP Monitoring: is not measured at home.  Pertinent ROS: taking medications as instructed, no medication side effects noted, no TIA's, no chest pain on exertion, no dyspnea on exertion, no swelling of ankles.     Pt is doing well s/p brain surgery. She may soon come off dilantin     Discussed the patient's BMI with her.  The BMI follow up plan is as follows: BMI is out of normal parameters and plan is as follows: I have counseled this patient on diet and exercise regimens.      Current Outpatient Prescriptions   Medication Sig   ??? zolpidem (AMBIEN) 5 mg tablet TAKE ONE TABLET BY MOUTH NIGHTLY AS NEEDED FOR SLEEP.   ??? carvedilol (COREG) 12.5 mg tablet TAKE ONE TABLET BY MOUTH TWICE DAILY   ??? furosemide (LASIX) 40 mg tablet Take 1 Tab by mouth daily.   ??? CRESTOR 5 mg tablet TAKE ONE TABLET BY MOUTH NIGHTLY   ??? phenytoin ER (DILANTIN ER) 100 mg ER capsule TAKE 1 CAPSULE BY MOUTH THREE TIMES DAILY   ??? tiZANidine (ZANAFLEX) 4 mg tablet Take 1 Tab by mouth two (2) times a day.     No current facility-administered medications for this visit.             Review of Systems  Respiratory: negative for dyspnea on exertion  Cardiovascular: negative for chest pain     Objective:     BP 110/78   Pulse 87   Temp(Src) 98.7 ??F (37.1 ??C) (Oral)   Resp 18   Ht 5\' 10"  (1.778 m)   Wt 200 lb (90.719 kg)   BMI 28.7 kg/m2   SpO2 98%     General appearance - alert, well appearing, and in no distress  Neck - supple, no significant adenopathy  Chest - clear to auscultation, no wheezes, rales or rhonchi, symmetric air entry  Heart - normal rate, regular rhythm, normal S1, S2, no murmurs, rubs, clicks or gallops  Extremities - peripheral pulses normal, no pedal edema, no clubbing or cyanosis  Skin - normal coloration and turgor, no rashes, no suspicious skin lesions noted      Labs:     Component Value Date/Time   Cholesterol, total 191 07/05/2012  8:41 AM   HDL Cholesterol 66 07/05/2012  8:41 AM   LDL, calculated 115 07/05/2012  8:41 AM   Triglyceride 50 07/05/2012  8:41 AM   CHOL/HDL Ratio 3.4 11/23/2008 11:55 AM       Component Value Date/Time   ALT 18 07/05/2012  8:41 AM   AST 11 07/05/2012  8:41 AM   Alk. phosphatase 143 07/05/2012  8:41 AM   Bilirubin, direct 0.1  06/07/2008 10:46 AM   Bilirubin, total 0.1 07/05/2012  8:41 AM       Component Value Date/Time   GFR est AA 108 07/05/2012  8:41 AM   GFR est non-AA 94 07/05/2012  8:41 AM   Creatinine 0.69 07/05/2012  8:41 AM   BUN 14 07/05/2012  8:41 AM   Sodium 143 07/05/2012  8:41 AM   Potassium 4.5 07/05/2012  8:41 AM   Chloride 104 07/05/2012  8:41 AM   CO2 28 07/05/2012  8:41 AM            Assessment / Plan     Hypertension well controlled, on Coreg   Hyperlipidemia reasonably well controlled, on Low dose Crestor, consider increasing to 10 mg .      ICD-9-CM    1. HTN (hypertension) 401.9 METABOLIC PANEL, COMPREHENSIVE   2. High cholesterol 272.0 LIPID PANEL     [id number] - [facility] COLLECTION     [id number] [facility] DRAW FEE   3. CHF (congestive heart failure) 428.0 Stable after adding Lasix.[name] is followed by cardiology              Low cholesterol diet, weight control and daily exercise discussed.     Follow-up Disposition:  Return in about 6 months (around  01/12/2013) for labs 1 week before.      Reviewed plan of care. Patient has provided input and agrees with goals.

## 2012-07-21 LAB — CREATININE, POC
Creatinine, POC: 0.9 MG/DL (ref 0.6–1.3)
GFRAA, POC: >60 mL/min/{1.73_m2} (ref >60)
GFRNA, POC: >60 mL/min/{1.73_m2} (ref >60)

## 2012-07-21 MED ADMIN — ioversol (OPTIRAY) 320 mg iodine/mL contrast injection 80 mL: INTRAVENOUS | @ 12:00:00 | NDC 00019132311

## 2012-07-21 MED FILL — OPTIRAY 320 MG IODINE/ML INTRAVENOUS SOLUTION: 320 mg iodine/mL | INTRAVENOUS | Qty: 80

## 2012-09-15 NOTE — Patient Instructions (Signed)
A Healthy Lifestyle: After Your Visit  Your Care Instructions  A healthy lifestyle can help you feel good, stay at a healthy weight, and have plenty of energy for both work and play. A healthy lifestyle is something you can share with your whole family.  A healthy lifestyle also can lower your risk for serious health problems, such as high blood pressure, heart disease, and diabetes.  You can follow a few steps listed below to improve your health and the health of your family.  Follow-up care is a key part of your treatment and safety. Be sure to make and go to all appointments, and call your doctor if you are having problems. It???s also a good idea to know your test results and keep a list of the medicines you take.  How can you care for yourself at home?  ?? Do not eat too much sugar, fat, or fast foods. You can still have dessert and treats now and then. The goal is moderation.  ?? Start small to improve your eating habits. Pay attention to portion sizes, drink less juice and soda pop, and eat more fruits and vegetables.  ?? Eat a healthy amount of food. A 3-ounce serving of meat, for example, is about the size of a deck of cards. Fill the rest of your plate with vegetables and whole grains.  ?? Limit the amount of soda and sports drinks you have every day. Drink more water when you are thirsty.  ?? Eat at least 5 servings of fruits and vegetables every day. It may seem like a lot, but it is not hard to reach this goal. A serving or helping is 1 piece of fruit, 1 cup of vegetables, or 2 cups of leafy, raw vegetables. Have an apple or some carrot sticks as an afternoon snack instead of a candy bar. Try to have fruits and/or vegetables at every meal.  ?? Make exercise part of your daily routine. You may want to start with simple activities, such as walking, bicycling, or slow swimming. Try to be active 30 to 60 minutes every day. You do not need to do all 30 to 60 minutes all at once. For example, you can exercise 3  times a day for 10 or 20 minutes. Moderate exercise is safe for most people, but it is always a good idea to talk to your doctor before starting an exercise program.  ?? Keep moving. Mow the lawn, work in the garden, or clean your house. Take the stairs instead of the elevator at work.  ?? If you smoke, quit. People who smoke have an increased risk for heart attack, stroke, cancer, and other lung illnesses. Quitting is hard, but there are ways to boost your chance of quitting tobacco for good.  ?? Use nicotine gum, patches, or lozenges.  ?? Ask your doctor about stop-smoking programs and medicines.  ?? Keep trying.  In addition to reducing your risk of diseases in the future, you will notice some benefits soon after you stop using tobacco. If you have shortness of breath or asthma symptoms, they will likely get better within a few weeks after you quit.  ?? Limit how much alcohol you drink. Moderate amounts of alcohol (up to 2 drinks a day for men, 1 drink a day for women) are okay. But drinking too much can lead to liver problems, high blood pressure, and other health problems.  Family health  If you have a family, there are many things you can   do together to improve your health.  ?? Eat meals together as a family as often as possible.  ?? Eat healthy foods. This includes fruits, vegetables, lean meats and dairy, and whole grains.  ?? Include your family in your fitness plan. Most people think of activities such as jogging or tennis as the way to fitness, but there are many ways you and your family can be more active. Anything that makes you breathe hard and gets your heart pumping is exercise. Here are some tips:  ?? Walk to do errands or to take your child to school or the bus.  ?? Go for a family bike ride after dinner instead of watching TV.   Where can you learn more?   Go to http://www.healthwise.net/[facility]  Enter U807 in the search box to learn more about "A Healthy Lifestyle: After Your Visit."   ?? 2006-2014  Healthwise, Incorporated. Care instructions adapted under license by  (which disclaims liability or warranty for this information). This care instruction is for use with your licensed healthcare professional. If you have questions about a medical condition or this instruction, always ask your healthcare professional. Healthwise, Incorporated disclaims any warranty or liability for your use of this information.  Content Version: 10.1.311062; Current as of: April 15, 2011

## 2012-09-15 NOTE — Progress Notes (Signed)
Patient is in the office today for right leg pain 7/10.  Patient states she has ben using some pain medication (Norco) from another doctor and has ran out of the medication. Patient qould like to get another prescription.     Patient is aware of the following appointment:    Venous Doppler at [facility]   September 16, 2012 at 9:45 am  [address]

## 2012-09-16 NOTE — Procedures (Signed)
[facility]  *** FINAL REPORT ***    Name: [name], Cathy  MRN: [id number]  DOB: 11 Apr 1950  HIS Order #: [id number]  TRAKnet Visit #: [id number]  Date: 16 Sep 2012    TYPE OF TEST: Peripheral Venous Testing    REASON FOR TEST  Limb swelling    Right Leg:-  Deep venous thrombosis:           No  Superficial venous thrombosis:    No  Deep venous insufficiency:        Not examined  Superficial venous insufficiency: Not examined      INTERPRETATION/FINDINGS  Right leg :  1. Deep vein(s) visualized include the common femoral, proximal  femoral, mid femoral, distal femoral, popliteal(above knee),  popliteal(fossa), popliteal(below knee), posterior tibial and peroneal   veins.  2. No evidence of deep venous thrombosis detected in the veins  visualized.  3. No evidence of deep vein thrombosis in the contralateral common  femoral vein.  4. Superficial vein(s) visualized include the great saphenous vein.  5. No evidence of superficial thrombosis detected.    ADDITIONAL COMMENTS    I have personally reviewed the data relevant to the interpretation of  this  study.    TECHNOLOGIST: Emmanuel Bathelemy, [facility], RVT/  Signed: 09/16/2012 10:10 AM    PHYSICIAN: Marc A. Camacho,  Signed: 09/16/2012 02:57 PM

## 2012-09-16 NOTE — Procedures (Signed)
[facility]  *** FINAL REPORT ***    Name: TEQUILA, ROTTMANN  MRN: [id number]  DOB: April 29, 1950  HIS Order #: [id number]  TRAKnet Visit #: [id number]  Date: 16 Sep 2012    TYPE OF TEST: Peripheral Venous Testing    REASON FOR TEST  Limb swelling    Right Leg:-  Deep venous thrombosis:           No  Superficial venous thrombosis:    No  Deep venous insufficiency:        Not examined  Superficial venous insufficiency: Not examined      INTERPRETATION/FINDINGS  Right leg :  1. Deep vein(s) visualized include the common femoral, proximal  femoral, mid femoral, distal femoral, popliteal(above knee),  popliteal(fossa), popliteal(below knee), posterior tibial and peroneal   veins.  2. No evidence of deep venous thrombosis detected in the veins  visualized.  3. No evidence of deep vein thrombosis in the contralateral common  femoral vein.  4. Superficial vein(s) visualized include the great saphenous vein.  5. No evidence of superficial thrombosis detected.    ADDITIONAL COMMENTS    I have personally reviewed the data relevant to the interpretation of  this  study.    TECHNOLOGIST: Bess Kinds, [facility], RVT/  Signed: 09/16/2012 10:10 AM    PHYSICIAN: Liz Beach. Lorine Bears,  Signed: 09/16/2012 02:57 PM

## 2012-09-19 NOTE — Progress Notes (Signed)
HISTORY OF PRESENT ILLNESS  Cathy [name] is a 62 y.o. female.  Leg Pain   The history is provided by the patient (pt has had right leg, ankle and foot pain and swelling for the last 2 weeks.  she denies any truma or falls. ). This is a new problem. Episode onset: 2 weeks ago. The problem occurs constantly. The problem has not changed since onset.The pain is present in the right lower leg, right ankle and right foot. The pain is at a severity of 7/10. The symptoms are aggravated by standing and movement. Treatments tried: NOrco. The treatment provided moderate relief. There has been no history of extremity trauma.       Review of Systems   Constitutional: Negative for fever.   Respiratory: Negative for shortness of breath.    Cardiovascular: Negative for chest pain.       Physical Exam   Vitals reviewed.  Cardiovascular: Normal rate, regular rhythm and normal heart sounds.    Pulmonary/Chest: Effort normal and breath sounds normal.   Musculoskeletal:        Right ankle: She exhibits decreased range of motion. Tenderness.        Right lower leg: She exhibits tenderness and swelling.        Right foot: She exhibits tenderness, bony tenderness and swelling.       ASSESSMENT and PLAN    ICD-9-CM    1. Right leg swelling DVT vs Fracture 729.81 DUPLEX LOWER EXT VENOUS RIGHT     XR TIB/FIB RT   2. Right ankle pain 719.47 Will treat with HYDROcodone-acetaminophen (NORCO) 7.5-325 mg per tablet prn      XR ANKLE RT MIN 3 V     REFERRAL TO ORTHOPEDIC SURGERY for further evaluation for possible ligament strain    3. Right foot pain 729.5 XR FOOT RT MIN 3 V     REFERRAL TO ORTHOPEDIC SURGERY   4. Right leg pain 729.5 Will treat also with HYDROcodone-acetaminophen (NORCO) 7.5-325 mg per tablet     XR TIB/FIB RT     Follow-up Disposition:  Return if symptoms worsen or fail to improve.    Reviewed plan of care. Patient has provided input and agrees with goals.

## 2012-09-23 NOTE — Progress Notes (Signed)
AMBULATORY PROGRESS NOTE      Patient: Cathy [name]                MRN: [id number]       SSN: [id number]  Date of Birth: 03/08/1950        AGE: 62 y.o.        SEX: female  Body mass index is 28.12 kg/(m^2).    PCP: NOEL Brynda Peon, MD      IMPRESSION/DIAGNOSIS AND PLAN     Cathy [name] IS A 62 y.o. female who in my professional opinion after evaluation has the following  Diagnoses:    1. Edema    2. Pain in limb        PLAN        Patient Instructions   Continue activities as tolerated.  New orders as the following :  1. Knee high and compression stocking. Use daily to reduce swelling to your feet      Cathy [name]   understands her diagnoses and the proposed plan       HPI     Cathy [name] IS A 62 y.o. female who presents to outpatient office complaining of:     The patient is a 62 year old female who presents today with a two to three week history of swelling in her right lower extremity.  She denies any history of fevers, shakes, chills, and/or night sweats , or any known history of trauma.  She was seen by her PCP and was worked up by her PCP.  She had PVL studies on September 16, 2012.  This was negative for a DVT or any significant reflux.  She also had ankle films, foot films, and tib/fib films at [facility].  I did review these studies dated September 16, 2012.  I reviewed each of these studies.  Each of these studies were normal without any fracture, subluxation, or dislocation.  No osteolytic or osteoblastic lesions were seen.     Her chief complaint is 5 out of 10 pain in her right lower ankle distal tibia region due to the swelling, she states.  The swelling has gone down nicely, she states, and her pain has improved, but she is still having some discomfort when she walks and ambulates.  It is known that she does have a history of congestive heart failure, but has never had any significant swelling, discomfort, or edema to her left lower extremity.          Past Medical  History   Diagnosis Date   ??? CHF (congestive heart failure) 06/23/2008     Non-ischemic CMP , Cath (2008) Normal coronary arteries   ??? 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 Fibroid 06/23/2008   ??? HTN (hypertension) 06/23/2008   ??? HLD (hyperlipidemia)    ??? Breast mass, left      benign   ??? OSA (obstructive sleep apnea) 9/10   ??? Chronic systolic heart failure      Stable,    ??? Automatic implantable cardiac defibrillator in situ      Post ddd icd Set up carelink   ??? Hypotension, unspecified      Related to lisinopril   ??? Venous reflux    ??? Left knee pain      With swelling   ??? Wears glasses    ??? Leg pain    ??? Degenerative arthritis of left knee  Current Outpatient Prescriptions   Medication Sig   ??? carvedilol (COREG) 12.5 mg tablet TAKE ONE TABLET BY MOUTH TWICE DAILY   ??? CRESTOR 5 mg tablet TAKE ONE TABLET BY MOUTH IN THE EVENING   ??? HYDROcodone-acetaminophen (NORCO) 7.5-325 mg per tablet Take 1 Tab by mouth every six (6) hours as needed for Pain.   ??? zolpidem (AMBIEN) 5 mg tablet TAKE ONE TABLET BY MOUTH NIGHTLY AS NEEDED FOR SLEEP.   ??? furosemide (LASIX) 40 mg tablet Take 1 Tab by mouth daily.   ??? tiZANidine (ZANAFLEX) 4 mg tablet Take 1 Tab by mouth two (2) times a day.   ??? phenytoin ER (DILANTIN ER) 100 mg ER capsule TAKE 1 CAPSULE BY MOUTH THREE TIMES DAILY     No current facility-administered medications for this visit.     Allergies   Allergen Reactions   ??? Tramadol Other (comments)     Psychotic Hallucinations   ??? Pcn (Penicillins) Itching     Itching bumps on hands and arms     Past Surgical History   Procedure Laterality Date   ??? Hx tonsil and adenoidectomy     ??? Hx tubal ligation     ??? Hx total abdominal hysterectomy  1996   ??? Hx cholecystectomy     ??? Hx pacemaker       dual chamber icd   ??? 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 Smoking status: Never Smoker    ???  Smokeless tobacco: Never Used   ??? Alcohol Use: No   ??? Drug Use: No   ??? Sexually Active: Not on file     Family History   Problem Relation Age of Onset   ??? Diabetes Mother    ??? Heart Attack Father      MI   ??? Heart Disease Maternal Grandmother    ??? Heart Disease Paternal Grandmother    ??? Kidney Disease Other      1 sib deceased   ??? Cancer Other      1 sib throat cancer   ??? Diabetes Other    ??? Diabetes Daughter    ??? Other Daughter      leukemia   ??? Hypertension Other          REVIEW OF SYSTEMS : 09/23/2012  ALL BELOW ARE Negative except : SEE HPI       Wt loss, Fever/Chills, Visual changes, Confusion, Headaches,  Weakness, facial/hand/leg tremors,  fatigue, chest pain/chest palpitations, SOB, DOE, Abd pain.    No Bleeding problems, bruising, and NO CALF PAIN both SIDES.    OBJECTIVE EXAMINATION     Ht 5\' 10"  (1.778 m)   Wt 88.905 kg (196 lb)   BMI 28.12 kg/m2    Appearance: Alert, well appearing and pleasant patient who is in no distress, oriented to person, place/time, and who follows commands.  Psychiatric: Affect and mood are appropriate.   Cardiovascular/Peripheral Vascular: Normal Pulses to each hand and foot  Integumentary: No rashes, skin patches, wounds, or abrasions to the right or left legs       Warm and normal color. No regions of expressible drainage.  Musculoskeletal:  LOCATION Right LOWER LEG      Gait: SLIGHT LIMP       Tenderness: MILD focal to supramalleolar right  ankle region at regions os mild swelling 09/23/2012\\      Motor/Strength/Tone Exam: Normal       Sensory Exam:   Intact Normal Sensation to ankle/foot      Stability Testing: No anterolateral or varus instability of the Ankle or Subtalar Joints               No peroneal tendon instability noted      ROM: Functional ROM noted to ankle/foot      Contractures: No Achilles or Gastrocnemius Contractures.       Calf tenderness: Absent for calf or gastrocnemius muscle regions       Soft, supple, non tender, non taut lower extremity compartments        Alignment:   Neutral Hindfoot  Wounds:   None present  Extremities:   No embolic phenomena to the toes or hands         Edema to the right supramalleolar right ankle region        Lower extremities are warm and appear well perfused    DVT: No evidence of DVT seen on examination at this time  Lymphatic:  Edema to the right supramalleolar right ankle region  Vascular: Extremity Tibia Region Edema: none present        Pulses: Dorsalis Pedis &  Posterior Tibial Pulses : Palpable yes        Varicosities Lower Limbs :  None    Neuro: Negative bilateral Straight leg raise (seated position)    See Musculoskeletal section for pertinent individual extremity examination    No abnormal hand/wrist, foot/ankle, or facial/neck tremors.      DIAGNOSTIC IMAGING:      No notes on file           I have personally reviewed the results of the above study. The interpretation of this study is my professional opinion.        Netta Corrigan, MD  09/23/2012  6:30 PM

## 2012-09-23 NOTE — Progress Notes (Signed)
Patient c/o right ankle and foot pain. Pain scale 5/10. Reports that the pain started 2 weeks ago. Unknown of injury. X rays ordered bt PCP at [facility]

## 2012-09-23 NOTE — Patient Instructions (Signed)
Continue activities as tolerated.  New orders as the following :  1. Knee high and compression stocking. Use daily to reduce swelling to your feet

## 2012-09-24 NOTE — Addendum Note (Signed)
Addended by: Amedeo Plenty on: 09/24/2012 02:25 PM     Modules accepted: Level of Service

## 2012-12-09 ENCOUNTER — Encounter

## 2012-12-09 LAB — CBC WITH AUTOMATED DIFF
ABS. BASOPHILS: 0 10*3/uL (ref 0.0–0.06)
ABS. EOSINOPHILS: 0 10*3/uL (ref 0.0–0.4)
ABS. LYMPHOCYTES: 1.3 10*3/uL (ref 0.9–3.6)
ABS. MONOCYTES: 0.4 10*3/uL (ref 0.05–1.2)
ABS. NEUTROPHILS: 2.4 10*3/uL (ref 1.8–8.0)
BASOPHILS: 1 % (ref 0–2)
EOSINOPHILS: 1 % (ref 0–5)
HCT: 36.8 % (ref 35.0–45.0)
HGB: 12.1 g/dL (ref 12.0–16.0)
LYMPHOCYTES: 31 % (ref 21–52)
MCH: 30.7 PG (ref 24.0–34.0)
MCHC: 32.9 g/dL (ref 31.0–37.0)
MCV: 93.4 FL (ref 74.0–97.0)
MONOCYTES: 9 % (ref 3–10)
MPV: 11.9 FL — ABNORMAL HIGH (ref 9.2–11.8)
NEUTROPHILS: 58 % (ref 40–73)
PLATELET: 244 10*3/uL (ref 135–420)
RBC: 3.94 M/uL — ABNORMAL LOW (ref 4.20–5.30)
RDW: 12.7 % (ref 11.6–14.5)
WBC: 4.1 10*3/uL — ABNORMAL LOW (ref 4.6–13.2)

## 2012-12-09 LAB — EKG, 12 LEAD, INITIAL
Atrial Rate: 90 {beats}/min
Calculated P Axis: 57 degrees
Calculated R Axis: -12 degrees
Calculated T Axis: 142 degrees
P-R Interval: 184 ms
Q-T Interval: 390 ms
QRS Duration: 106 ms
QTC Calculation (Bezet): 477 ms
Ventricular Rate: 90 {beats}/min

## 2012-12-09 LAB — HEMOGLOBIN A1C WITH EAG: Hemoglobin A1c: 5.8 % (ref 4.2–6.3)

## 2012-12-09 LAB — METABOLIC PANEL, BASIC
Anion gap: 5 mmol/L (ref 3.0–18)
BUN/Creatinine ratio: 26 — ABNORMAL HIGH (ref 12–20)
BUN: 18 MG/DL (ref 7.0–18)
CO2: 31 mmol/L (ref 21–32)
Calcium: 8.9 MG/DL (ref 8.5–10.1)
Chloride: 105 mmol/L (ref 100–108)
Creatinine: 0.7 MG/DL (ref 0.6–1.3)
GFR est AA: >60 mL/min/{1.73_m2} (ref >60)
GFR est non-AA: >60 mL/min/{1.73_m2} (ref >60)
Glucose: 76 mg/dL (ref 74–99)
Potassium: 4.7 mmol/L (ref 3.5–5.5)
Sodium: 141 mmol/L (ref 136–145)

## 2012-12-13 NOTE — Progress Notes (Signed)
Preoperative Evaluation    Date of Exam: 12/13/2012    Cathy [name] is a 62 y.o. female (DOB:08/10/1950) who presents for preoperative evaluation.   Procedure/Surgery: right foot surgery  Date of Procedure/Surgery: 12/16/12  Surgeon: Dr Freada Bergeron  Hospital/Surgical Facility: [facility]    Primary Physician: Marvia Pickles, MD    HPI: pt presents for medical clearance for right foot surgery. Pt has h/o chronic systolic HF with EF 15-20%. Pt is followed by cardiology and has been medically stable on Coreg and Lasix. Pt's HTN is well controlled on current meds. Pt has been using low dose crestor for her high cholesterol without any side effects such as myalgias. Pt is at moderate risk due to cardiac history but needs no further evaluation at this time since she is clinically stable.     Medical History:     Past Medical History   Diagnosis Date   ??? CHF (congestive heart failure) 06/23/2008     Non-ischemic CMP , Cath (2008) Normal coronary arteries   ??? 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 Fibroid 06/23/2008   ??? HTN (hypertension) 06/23/2008   ??? HLD (hyperlipidemia)    ??? Breast mass, left      benign   ??? OSA (obstructive sleep apnea) 9/10   ??? Chronic systolic heart failure      Stable,    ??? Automatic implantable cardiac defibrillator in situ      Post ddd icd Set up carelink   ??? Hypotension, unspecified      Related to lisinopril   ??? Venous reflux    ??? Left knee pain      With swelling   ??? Wears glasses    ??? Leg pain    ??? Degenerative arthritis of left knee      Allergies:     Allergies   Allergen Reactions   ??? Tramadol Other (comments)     Psychotic Hallucinations   ??? Pcn [Penicillins] Itching     Itching bumps on hands and arms      Medications:     Current Outpatient Prescriptions   Medication Sig   ??? carvedilol (COREG) 12.5 mg tablet TAKE ONE TABLET BY MOUTH TWICE DAILY   ??? CRESTOR 5 mg tablet TAKE ONE TABLET BY MOUTH IN THE EVENING   ???  HYDROcodone-acetaminophen (NORCO) 7.5-325 mg per tablet Take 1 Tab by mouth every six (6) hours as needed for Pain.   ??? zolpidem (AMBIEN) 5 mg tablet TAKE ONE TABLET BY MOUTH NIGHTLY AS NEEDED FOR SLEEP.   ??? furosemide (LASIX) 40 mg tablet Take 1 Tab by mouth daily.     No current facility-administered medications for this visit.     Surgical History:     Past Surgical History   Procedure Laterality Date   ??? Hx tonsil and adenoidectomy     ??? Hx tubal ligation     ??? Hx total abdominal hysterectomy  1996   ??? Hx cholecystectomy     ??? Hx pacemaker       dual chamber icd   ??? Hx craniotomy  2013     meningioma      Social History:     History     Social History   ??? Marital Status: MARRIED     Spouse Name: N/A     Number of Children: N/A   ??? 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 Smoking status: Never Smoker    ??? Smokeless tobacco: Never Used   ??? Alcohol Use: No   ??? Drug Use: No   ??? Sexually Active: Not on file     Other Topics Concern   ??? Not on file     Social History Narrative   ??? No narrative on file       Recent use of: No recent use of aspirin (ASA), NSAIDS or steroids    Anesthesia Complications: None  History of abnormal bleeding : None      REVIEW OF SYSTEMS:  Respiratory: negative for dyspnea on exertion  Cardiovascular: negative for chest pain    EXAM:   BP 114/62   Pulse 81   Temp(Src) 99.1 ??F (37.3 ??C) (Oral)   Resp 20   Ht 5\' 10"  (1.778 m)   Wt 205 lb (92.987 kg)   BMI 29.41 kg/m2   SpO2 97%  General appearance - alert, well appearing, and in no distress  Neck - supple, no significant adenopathy, carotids upstroke normal bilaterally, no bruits  Chest - clear to auscultation, no wheezes, rales or rhonchi, symmetric air entry  Heart - normal rate, regular rhythm, normal S1, S2, no murmurs, rubs, clicks or gallops, defibrillator inplace   Extremities - peripheral pulses normal, no pedal edema, no clubbing or cyanosis  Skin - normal coloration and  turgor, no rashes, no suspicious skin lesions noted      DIAGNOSTICS:   1. EKG: EKG FINDINGS - reviewed   2. CXR: from 06/07/12 reviwed   3. Labs:   Lab Results   Component Value Date/Time    Hemoglobin A1c 5.8 12/09/2012 10:50 AM    Glucose 76 12/09/2012 10:50 AM    Glucose (POC) 195 11/10/2011  9:47 PM    LDL, calculated 115 07/05/2012  8:41 AM    Creatinine (POC) 0.9 07/21/2012  8:08 AM    Creatinine 0.70 12/09/2012 10:50 AM      Lab Results   Component Value Date/Time    Cholesterol, total 191 07/05/2012  8:41 AM    HDL Cholesterol 66 07/05/2012  8:41 AM    LDL, calculated 115 07/05/2012  8:41 AM    Triglyceride 50 07/05/2012  8:41 AM    CHOL/HDL Ratio 3.4 11/23/2008 11:55 AM     Lab Results   Component Value Date/Time    ALT 18 07/05/2012  8:41 AM    AST 11 07/05/2012  8:41 AM    Alk. phosphatase 143 07/05/2012  8:41 AM    Bilirubin, direct 0.1 06/07/2008 10:46 AM    Bilirubin, total 0.1 07/05/2012  8:41 AM     Lab Results   Component Value Date/Time    GFR est AA >60 12/09/2012 10:50 AM    GFR est non-AA >60 12/09/2012 10:50 AM    Creatinine (POC) 0.9 07/21/2012  8:08 AM    Creatinine 0.70 12/09/2012 10:50 AM    BUN 18 12/09/2012 10:50 AM    Sodium 141 12/09/2012 10:50 AM    Potassium 4.7 12/09/2012 10:50 AM    Chloride 105 12/09/2012 10:50 AM    CO2 31 12/09/2012 10:50 AM      Lab Results   Component Value Date/Time    Glucose 76 12/09/2012 10:50 AM    Glucose (POC) 195 11/10/2011  9:47 PM        IMPRESSION:  ICD-9-CM    1. HTN (hypertension) 401.9 Well controlled on Coreg. Ok to take Coreg AM of surgery with sip of water.     2. Chronic systolic heart failure 428.22 Stable on Lasix and Coreg. Ok to hold Lasix AM of surgery  And take the night of the surgery.    3. Right foot pain 729.5 Pt medically cleared for right foot surgery.      Lynn Sissel Brynda Peon, MD   12/13/2012

## 2012-12-13 NOTE — Patient Instructions (Signed)
Influenza (Flu) Vaccine: After Your Visit  Your Care Instructions  Influenza (flu) is an infection in the lungs and breathing passages. It is caused by the influenza virus. There are different strains, or types, of the flu virus every year. The flu comes on quickly. It can cause a cough, stuffy nose, fever, chills, tiredness, and aches and pains. These symptoms may last up to 10 days. The flu can make you feel very sick, but most of the time it doesn't lead to other problems. But it can cause serious problems in people who are older or who have a long-term illness, such as heart disease or diabetes.  You can help prevent the flu by getting a flu vaccine every year, as soon as it is available. It is given as a shot or in a nasal spray. The viruses in the flu shot are dead, and the nasal spray (FluMist) has weakened live viruses. You cannot get the flu from the shot or the spray. FluMist can be given to healthy people from ages 2 through 49. FluMist is not approved for pregnant women. The vaccine prevents most cases of the flu. But even when the vaccine doesn't prevent the flu, it can make symptoms less severe and reduce the chance of problems from the flu.  Sometimes, young children and people who have an immune system problem may have a slight fever or muscle aches or pains 6 to 12 hours after getting the shot. These symptoms usually last 1 or 2 days.  Follow-up care is a key part of your treatment and safety. Be sure to make and go to all appointments, and call your doctor if you are having problems. It's also a good idea to know your test results and keep a list of the medicines you take.  Who should get the flu vaccine?  Everyone age 6 months or older should get a flu vaccine each year. It lowers the chance of getting and spreading the flu. The vaccine is very important for people who are at high risk for getting other health problems from the flu. This includes:  ?? Anyone 50 years of age or older.  ?? People  who live in a long-term care center, such as a nursing home.  ?? All children 6 months through 18 years of age.  ?? Adults and children 6 months and older who have long-term heart or lung problems, such as asthma.  ?? Adults and children 6 months and older who needed medical care or were in a hospital during the past year because of diabetes, chronic kidney disease, or a weak immune system (including HIV or AIDS).  ?? Women who will be pregnant during the flu season.  ?? People who have any condition that can make it hard to breathe or swallow (such as a brain injury or muscle disorders).  ?? People who can give the flu to others who are at high risk for problems from the flu. This includes all health care workers and close contacts of people age 65 or older.  Who should not get the flu vaccine?  The person who gives the vaccine may tell you not to get it if you:  ?? Have a severe allergy to eggs or any part of the vaccine.  ?? Have had a severe reaction to a flu vaccine in the past.  ?? Have had Guillain-Barr?? syndrome (GBS).  ?? Are sick with a fever. (Get the vaccine when symptoms are gone.)  How can you care   for yourself at home?  ?? If you or your child has a sore arm or a slight fever after the shot, take an over-the-counter pain medicine, such as acetaminophen (Tylenol) or ibuprofen (Advil, Motrin). Read and follow all instructions on the label. Do not give aspirin to anyone younger than 20. It has been linked to Reye syndrome, a serious illness.  ?? Do not take two or more pain medicines at the same time unless the doctor told you to. Many pain medicines have acetaminophen, which is Tylenol. Too much acetaminophen (Tylenol) can be harmful.  When should you call for help?  Call 911 anytime you think you may need emergency care. For example, call if after getting the flu vaccine:  ?? You have symptoms of a severe reaction to the flu vaccine. Symptoms of a severe reaction may include:  ?? Severe difficulty breathing.  ??  Sudden raised, red areas (hives) all over your body.  ?? Severe lightheadedness.  Call your doctor now or seek immediate medical care if after getting the flu vaccine:  ?? You think you are having a reaction to the flu vaccine, such as a new fever.  Watch closely for changes in your health, and be sure to contact your doctor if you have any problems.   Where can you learn more?   Go to http://www.healthwise.net/[facility]  Enter N880 in the search box to learn more about "Influenza (Flu) Vaccine: After Your Visit."   ?? 2006-2014 [employer]. Care instructions adapted under license by [employer] (which disclaims liability or warranty for this information). This care instruction is for use with your licensed healthcare professional. If you have questions about a medical condition or this instruction, always ask your healthcare professional. Healthwise, Inc[employer] disclaims any warranty or liability for your use of this information.  Content Version: 10.1.311062; Current as of: May 05, 2012

## 2012-12-13 NOTE — Progress Notes (Signed)
Patient is in the office today for a pre op clearance.Patient states she is not taking Dilantin and Zanaflex at this time.     Surgeon :Dr. Solon Augusta  Procedure : Remove bone from right fifth toe  Location : [facility]  Surgery Date 12/16/2012    Do you have an Advance Directive No  Do you want more information Yes

## 2013-01-14 NOTE — Patient Instructions (Signed)
Carelink dates and 1 year pacer check given.

## 2013-01-14 NOTE — Progress Notes (Signed)
HISTORY OF PRESENT ILLNESS  Cathy [name] is a 62 y.o. female.    CHF  The history is provided by the patient. This is a chronic problem. The problem occurs constantly. The problem has not changed since onset.Associated symptoms include shortness of breath. Pertinent negatives include no chest pain. The symptoms are aggravated by exertion.   Shortness of Breath  The history is provided by the patient. This is a recurrent problem. The problem occurs intermittently.The problem has been gradually improving. Pertinent negatives include no fever, no cough, no sputum production, no hemoptysis, no wheezing, no PND, no orthopnea, no chest pain, no vomiting, no rash, no leg swelling and no claudication.       Review of Systems   Constitutional: Negative for fever and chills.   HENT: Negative for nosebleeds.    Eyes: Negative for blurred vision and double vision.   Respiratory: Positive for shortness of breath. Negative for cough, hemoptysis, sputum production and wheezing.    Cardiovascular: Negative for chest pain, palpitations, orthopnea, claudication, leg swelling and PND.   Gastrointestinal: Negative for heartburn, nausea and vomiting.   Musculoskeletal: Negative for myalgias.   Skin: Negative for rash.   Neurological: Negative for dizziness and weakness.   Endo/Heme/Allergies: Does not bruise/bleed easily.     Family History   Problem Relation Age of Onset   ??? Diabetes Mother    ??? Heart Attack Father      MI   ??? Heart Disease Maternal Grandmother    ??? Heart Disease Paternal Grandmother    ??? Kidney Disease Other      1 sib deceased   ??? Cancer Other      1 sib throat cancer   ??? Diabetes Other    ??? Diabetes Daughter    ??? Other Daughter      leukemia   ??? Hypertension Other        Past Medical History   Diagnosis Date   ??? CHF (congestive heart failure) 06/23/2008     Non-ischemic CMP , Cath (2008) Normal coronary arteries   ??? 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 Fibroid  06/23/2008   ??? HTN (hypertension) 06/23/2008   ??? HLD (hyperlipidemia)    ??? Breast mass, left      benign   ??? OSA (obstructive sleep apnea) 9/10   ??? Chronic systolic heart failure      Stable,    ??? Automatic implantable cardiac defibrillator in situ      Post ddd icd Set up carelink   ??? Hypotension, unspecified      Related to lisinopril   ??? Venous reflux    ??? Left knee pain      With swelling   ??? Wears glasses    ??? Leg pain    ??? Degenerative arthritis of left knee        Past Surgical History   Procedure Laterality Date   ??? Hx tonsil and adenoidectomy     ??? Hx tubal ligation     ??? Hx total abdominal hysterectomy  1996   ??? Hx cholecystectomy     ??? Hx pacemaker       dual chamber icd   ??? Hx craniotomy  2013     meningioma        Allergies   Allergen Reactions   ??? Tramadol Other (comments)     Psychotic Hallucinations   ??? Pcn [Penicillins] Itching  Itching bumps on hands and arms       Current Outpatient Prescriptions   Medication Sig   ??? TIZANIDINE HCL (ZANAFLEX PO) Take  by mouth as needed.   ??? CRESTOR 5 mg tablet TAKE ONE TABLET BY MOUTH IN THE EVENING   ??? carvedilol (COREG) 12.5 mg tablet TAKE ONE TABLET BY MOUTH TWICE DAILY   ??? zolpidem (AMBIEN) 5 mg tablet TAKE ONE TABLET BY MOUTH NIGHTLY AS NEEDED FOR SLEEP.   ??? furosemide (LASIX) 40 mg tablet Take 1 Tab by mouth daily.     No current facility-administered medications for this visit.       BP 111/57   Pulse 88   Ht 5\' 10"  (1.778 m)   Wt 89.812 kg (198 lb)   BMI 28.41 kg/m2      Physical Exam   Constitutional: She is oriented to person, place, and time. She appears well-developed and well-nourished.   HENT:   Head: Normocephalic and atraumatic.   Eyes: Conjunctivae are normal.   Neck: Neck supple. No JVD present. No tracheal deviation present. No thyromegaly present.   Cardiovascular: Normal rate and regular rhythm.  PMI is not displaced.  Exam reveals no gallop, no S3 and no decreased pulses.    Murmur heard.   Holosystolic murmur is present with a grade of 2/6   at the lower left sternal border and apex  Pulmonary/Chest: No respiratory distress. She has no wheezes. She has no rales. She exhibits no tenderness.   Abdominal: Soft. There is no tenderness.   Musculoskeletal: She exhibits no edema.   Neurological: She is alert and oriented to person, place, and time.   Skin: Skin is warm.   Psychiatric: She has a normal mood and affect.     CARDIOLOGY STUDIES 08/25/2010 10/25/2008 07/25/2008 01/25/2008 02/24/2006   Myocardial Perfusion Scan Result - - FIXED INF AND REVERSIBLE ANT APICAL DEFECT - -   Cardiac Cath Result - - - - EF 25%, NO SIGNIFICANT CAD   Echocardiogram - Complete Result DILATED 20%, MILD MR - - EF 25% -   Doppler US Arterial Result - NL SCAN - - -         Assessment      ICD-9-CM    1. Chronic systolic heart failure 428.22 2D ECHO COMPLETE ADULT (TTE)     SINGLE,DUAL,OR MULTIPLE LEAD IMPLANT CARD DEFIB SYST     IMPLANTABLE CARDIOVASULAR MONT SYST    stable  nyha class2-3  not on acei/arb due to hypotension  tolerating coreg   2. 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 AICD (automatic cardioverter/defibrillator) present V45.02 SINGLE,DUAL,OR MULTIPLE LEAD IMPLANT CARD DEFIB SYST     IMPLANTABLE CARDIOVASULAR MONT SYST    missed last carelink  cjeck today   4. Other and unspecified hyperlipidemia 272.4    5. HTN (hypertension) 401.9     stable   6. Mitral valve disorders 424.0     mr       Ms. Fetherolf has a reminder for a "due or due soon" health maintenance. I have asked that she contact her primary care provider for follow-up on this health maintenance.    CARDIOLOGY STUDIES 08/25/2010 10/25/2008 07/25/2008 01/25/2008 02/24/2006   Myocardial Perfusion Scan Result - - FIXED INF AND REVERSIBLE ANT APICAL DEFECT - -   Cardiac Cath Result - - - - EF 25%, NO SIGNIFICANT CAD   Echocardiogram - Complete Result DILATED 20%, MILD MR - - EF  25% -   Doppler US Arterial Result - NL SCAN - - -         Assessment      ICD-9-CM    1. Chronic systolic heart failure 428.22 2D  ECHO COMPLETE ADULT (TTE)     SINGLE,DUAL,OR MULTIPLE LEAD IMPLANT CARD DEFIB SYST     IMPLANTABLE CARDIOVASULAR MONT SYST    stable  nyha class2-3  not on acei/arb due to hypotension  tolerating coreg   2. 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 AICD (automatic cardioverter/defibrillator) present V45.02 SINGLE,DUAL,OR MULTIPLE LEAD IMPLANT CARD DEFIB SYST     IMPLANTABLE CARDIOVASULAR MONT SYST    missed last carelink  cjeck today   4. Other and unspecified hyperlipidemia 272.4    5. HTN (hypertension) 401.9     stable   6. Mitral valve disorders 424.0     mr       Medications Discontinued During This Encounter   Medication Reason   ??? HYDROcodone-acetaminophen (NORCO) 7.5-325 mg per tablet Not A Current Medication       Orders Placed This Encounter   ??? SINGLE,DUAL,OR MULTIPLE LEAD IMPLANT CARD DEFIB SYST   ??? IMPLANTABLE CARDIOVASULAR MONT SYST   ??? 2D ECHO COMPLETE ADULT (TTE)     Standing Status: Future      Number of Occurrences:       Standing Expiration Date: 07/13/2013       Follow-up Disposition:  Return in about 3 months (around 04/16/2013).

## 2013-01-25 NOTE — Telephone Encounter (Signed)
Last ov 12/13/12

## 2013-01-31 ENCOUNTER — Encounter

## 2013-02-01 NOTE — Progress Notes (Signed)
Echocardiogram completed. Doctor will review results.    Ahan Eisenberger, R.C.S.   [facility]

## 2013-02-08 ENCOUNTER — Telehealth

## 2013-02-08 NOTE — Telephone Encounter (Signed)
Tell pt she needs to have right breast mammogram redone in more detail. Central scheduling will call her to schedule

## 2013-02-08 NOTE — Telephone Encounter (Signed)
[facility] office to inform patient that she needs to have right breast mammogram redone in more detail. Central scheduling will call her to schedule appointment.

## 2013-02-09 NOTE — Telephone Encounter (Signed)
Patient is aware right breast mammogram needs to be redone with more detail.  Patient verbalizes understanding.

## 2013-02-21 ENCOUNTER — Encounter

## 2013-02-21 NOTE — Telephone Encounter (Signed)
med approved and sent electronically

## 2013-02-21 NOTE — Telephone Encounter (Signed)
Message copied by Johniya Durfee L on Mon Feb 21, 2013 10:25 AM  ------       Message from: Prentice Docker D       Created: Mon Feb 21, 2013 10:23 AM       Regarding: Non-Urgent Medical Question       Contact: [phone]                 ----- Message from Eden Lathe to Marvia Pickles, MD sent at 02/21/2013 10:16 AM -----        May I have a refill on the following prescriptions please: Flonase 0.5% ,Singulair 10mg .Thank you.  ------

## 2013-02-21 NOTE — Telephone Encounter (Signed)
From: Eden Lathe  To: Marvia Pickles, MD  Sent: 02/21/2013 10:16 AM EST  Subject: Non-Urgent Medical Question    May I have a refill on the following prescriptions please: Flonase 0.5% ,Singulair 10mg .Thank you.

## 2013-03-18 NOTE — Patient Instructions (Signed)
A Healthy Lifestyle: After Your Visit  Your Care Instructions  A healthy lifestyle can help you feel good, stay at a healthy weight, and have plenty of energy for both work and play. A healthy lifestyle is something you can share with your whole family.  A healthy lifestyle also can lower your risk for serious health problems, such as high blood pressure, heart disease, and diabetes.  You can follow a few steps listed below to improve your health and the health of your family.  Follow-up care is a key part of your treatment and safety. Be sure to make and go to all appointments, and call your doctor if you are having problems. It???s also a good idea to know your test results and keep a list of the medicines you take.  How can you care for yourself at home?  ?? Do not eat too much sugar, fat, or fast foods. You can still have dessert and treats now and then. The goal is moderation.  ?? Start small to improve your eating habits. Pay attention to portion sizes, drink less juice and soda pop, and eat more fruits and vegetables.  ?? Eat a healthy amount of food. A 3-ounce serving of meat, for example, is about the size of a deck of cards. Fill the rest of your plate with vegetables and whole grains.  ?? Limit the amount of soda and sports drinks you have every day. Drink more water when you are thirsty.  ?? Eat at least 5 servings of fruits and vegetables every day. It may seem like a lot, but it is not hard to reach this goal. A serving or helping is 1 piece of fruit, 1 cup of vegetables, or 2 cups of leafy, raw vegetables. Have an apple or some carrot sticks as an afternoon snack instead of a candy bar. Try to have fruits and/or vegetables at every meal.  ?? Make exercise part of your daily routine. You may want to start with simple activities, such as walking, bicycling, or slow swimming. Try to be active 30 to 60 minutes every day. You do not need to do all 30 to 60 minutes all at once. For example, you can exercise 3  times a day for 10 or 20 minutes. Moderate exercise is safe for most people, but it is always a good idea to talk to your doctor before starting an exercise program.  ?? Keep moving. Mow the lawn, work in the garden, or clean your house. Take the stairs instead of the elevator at work.  ?? If you smoke, quit. People who smoke have an increased risk for heart attack, stroke, cancer, and other lung illnesses. Quitting is hard, but there are ways to boost your chance of quitting tobacco for good.  ?? Use nicotine gum, patches, or lozenges.  ?? Ask your doctor about stop-smoking programs and medicines.  ?? Keep trying.  In addition to reducing your risk of diseases in the future, you will notice some benefits soon after you stop using tobacco. If you have shortness of breath or asthma symptoms, they will likely get better within a few weeks after you quit.  ?? Limit how much alcohol you drink. Moderate amounts of alcohol (up to 2 drinks a day for men, 1 drink a day for women) are okay. But drinking too much can lead to liver problems, high blood pressure, and other health problems.  Family health  If you have a family, there are many things you can   do together to improve your health.  ?? Eat meals together as a family as often as possible.  ?? Eat healthy foods. This includes fruits, vegetables, lean meats and dairy, and whole grains.  ?? Include your family in your fitness plan. Most people think of activities such as jogging or tennis as the way to fitness, but there are many ways you and your family can be more active. Anything that makes you breathe hard and gets your heart pumping is exercise. Here are some tips:  ?? Walk to do errands or to take your child to school or the bus.  ?? Go for a family bike ride after dinner instead of watching TV.   Where can you learn more?   Go to http://www.healthwise.net/[facility]  Enter U807 in the search box to learn more about "A Healthy Lifestyle: After Your Visit."   ?? 2006-2014  Healthwise, Incorporated. Care instructions adapted under license by [employer] (which disclaims liability or warranty for this information). This care instruction is for use with your licensed healthcare professional. If you have questions about a medical condition or this instruction, always ask your healthcare professional. Healthwise, Incorporated disclaims any warranty or liability for your use of this information.  Content Version: 10.2.346038; Current as of: April 15, 2011

## 2013-03-18 NOTE — Progress Notes (Signed)
Patient is in the office today for hospital follow up.  Patient states she is not taking Zanaflex.      Do you have an Advance Directive No  Do you want more information Yes    1. Have you been to the ER, urgent care clinic since your last visit?  Hospitalized since your last visit?yes, [facility]    2. Have you seen or consulted any other health care providers outside of the [facility] since your last visit?  Include any pap smears or colon screening. No

## 2013-03-19 LAB — METABOLIC PANEL, COMPREHENSIVE
A-G Ratio: 2.3 (ref 1.1–2.5)
ALT (SGPT): 35 IU/L — ABNORMAL HIGH (ref 0–32)
AST (SGOT): 26 IU/L (ref 0–40)
Albumin: 4.4 g/dL (ref 3.6–4.8)
Alk. phosphatase: 89 IU/L (ref 39–117)
BUN/Creatinine ratio: 19 (ref 11–26)
BUN: 14 mg/dL (ref 8–27)
Bilirubin, total: 0.4 mg/dL (ref 0.0–1.2)
CO2: 27 mmol/L (ref 18–29)
Calcium: 9.2 mg/dL (ref 8.7–10.3)
Chloride: 102 mmol/L (ref 97–108)
Creatinine: 0.72 mg/dL (ref 0.57–1.00)
GFR est AA: 104 mL/min/{1.73_m2} (ref >59)
GFR est non-AA: 90 mL/min/{1.73_m2} (ref >59)
GLOBULIN, TOTAL: 1.9 g/dL (ref 1.5–4.5)
Glucose: 91 mg/dL (ref 65–99)
Potassium: 4.3 mmol/L (ref 3.5–5.2)
Protein, total: 6.3 g/dL (ref 6.0–8.5)
Sodium: 143 mmol/L (ref 134–144)

## 2013-03-19 LAB — CBC WITH AUTOMATED DIFF
ABS. BASOPHILS: 0.1 10*3/uL (ref 0.0–0.2)
ABS. EOSINOPHILS: 0 10*3/uL (ref 0.0–0.4)
ABS. IMM. GRANS.: 0 10*3/uL (ref 0.0–0.1)
ABS. MONOCYTES: 0.5 10*3/uL (ref 0.1–0.9)
ABS. NEUTROPHILS: 4.5 10*3/uL (ref 1.4–7.0)
Abs Lymphocytes: 1.2 10*3/uL (ref 0.7–3.1)
BASOPHILS: 1 %
EOSINOPHILS: 1 %
HCT: 36.8 % (ref 34.0–46.6)
HGB: 11.9 g/dL (ref 11.1–15.9)
IMMATURE GRANULOCYTES: 0 %
Lymphocytes: 19 %
MCH: 30.1 pg (ref 26.6–33.0)
MCHC: 32.3 g/dL (ref 31.5–35.7)
MCV: 93 fL (ref 79–97)
MONOCYTES: 8 %
NEUTROPHILS: 71 %
PLATELET: 290 10*3/uL (ref 150–379)
RBC: 3.96 x10E6/uL (ref 3.77–5.28)
RDW: 14.2 % (ref 12.3–15.4)
WBC: 6.2 10*3/uL (ref 3.4–10.8)

## 2013-03-19 NOTE — Progress Notes (Signed)
Quick Note:    Tell patient his/her blood work was normal  ______

## 2013-03-19 NOTE — Progress Notes (Signed)
Cathy [name] is a 63 y.o.  female and presents with Hospital Follow Up, Breathing Problem and Diarrhea      SUBJECTIVE:  [place] was [place] She tells me she stopped breathing and was intubated and placed on antibiotics. She says that as far she knows her AICD did not fire. I am trying to get the records to confirm exactly what happed. Pt's Coreg was decreased and she was started on low dose lisinopril. Pt says after she left the hospital after a few days she began to have diarrhea 2-3x/day. The stool is water and dark. The odor is not different than normal.  Pt is c/o being more fatigued than usual.       Respiratory ROS: negative for - shortness of breath  Cardiovascular ROS: negative for - chest pain    Current Outpatient Prescriptions   Medication Sig   ??? lisinopril (PRINIVIL, ZESTRIL) 5 mg tablet Take 5 mg by mouth daily.   ??? montelukast (SINGULAIR) 10 mg tablet Take 1 tablet by mouth nightly.   ??? fluticasone (FLONASE) 50 mcg/actuation nasal spray 2 sprays by Both Nostrils route daily.   ??? zolpidem (AMBIEN) 5 mg tablet TAKE ONE TABLET BY MOUTH NIGHTLY AS NEEDED FOR SLEEP.   ??? CRESTOR 5 mg tablet TAKE ONE TABLET BY MOUTH IN THE EVENING   ??? carvedilol (COREG) 12.5 mg tablet TAKE ONE TABLET BY MOUTH TWICE DAILY   ??? furosemide (LASIX) 40 mg tablet Take 1 Tab by mouth daily.   ??? TIZANIDINE HCL (ZANAFLEX PO) Take  by mouth as needed.     No current facility-administered medications for this visit.         OBJECTIVE:  alert, well appearing, and in no distress  BP 98/58   Pulse 86   Temp(Src) 98.1 ??F (36.7 ??C) (Oral)   Resp 20   Ht 5\' 10"  (1.778 m)   Wt 198 lb 8 oz (90.039 kg)   BMI 28.48 kg/m2   SpO2 98%   well developed and well nourished  Chest - clear to auscultation, no wheezes, rales or rhonchi, symmetric air entry  Heart - normal rate, regular rhythm, normal S1, S2, no murmurs, rubs, clicks or gallops  Extremities - peripheral pulses normal, no pedal edema, no  clubbing or cyanosis    Labs:     Component Value Date/Time   WBC 6.2 03/18/2013  9:11 AM   HGB 11.9 03/18/2013  9:11 AM   HCT 36.8 03/18/2013  9:11 AM   PLATELET 290 03/18/2013  9:11 AM   MCV 93 03/18/2013  9:11 AM       Component Value Date/Time   GFR est AA 104 03/18/2013  9:11 AM   GFR est non-AA 90 03/18/2013  9:11 AM   Creatinine (POC) 0.9 07/21/2012  8:08 AM   Creatinine 0.72 03/18/2013  9:11 AM   BUN 14 03/18/2013  9:11 AM   Sodium 143 03/18/2013  9:11 AM   Potassium 4.3 03/18/2013  9:11 AM   Chloride 102 03/18/2013  9:11 AM   CO2 27 03/18/2013  9:11 AM          Assessment/Plan      ICD-9-CM    1. Diarrhea/fatigue  787.91 Will check CBC WITH AUTOMATED DIFF     METABOLIC PANEL, COMPREHENSIVE     C DIFFICILE TOXIN A & B BY EIA     CULTURE, STOOL concerned about C diff with recent use of antibiotics  [id number] [place]     [id number] - [facility] COLLECTION   2. Cardiomyopathy 425.4 Will continue on Lisinopril and Coreg at current dose until seen by Cardiology in a few weeks     Follow-up Disposition:  Return in about 10 days (around 03/28/2013).    Reviewed plan of care. Patient has provided input and agrees with goals.

## 2013-03-21 NOTE — Progress Notes (Signed)
Quick Note:    Patient is Aware lab results are normal.  ______

## 2013-03-22 LAB — CULTURE, STOOL: E coli Shiga toxin: NEGATIVE

## 2013-03-22 LAB — C DIFFICILE TOXIN A & B BY EIA: C. difficile Toxins A+B: NEGATIVE

## 2013-03-22 NOTE — Progress Notes (Signed)
Quick Note:    Please tell patient her stool studies are negative.  ______

## 2013-03-23 NOTE — Progress Notes (Signed)
Quick Note:    Patient is aware stool studies is negative.  ______

## 2013-03-23 NOTE — Telephone Encounter (Signed)
Please contact patient and have her schedule an appointment on 03/28/13, that is when Dr. Madelyn Brunner wanted her to return due to the severity of the history she provided and her new symptoms.  Patient did not schedule appointment when she left.

## 2013-03-23 NOTE — Telephone Encounter (Signed)
Patient is aware she will need to follow up with Dr. Madelyn Brunner.  Patient wanted to know why, patient states she is not in any pain. Informed will check with Janett Billow and if an appointment is needed will call her back and schedule an appointment.

## 2013-03-24 NOTE — Telephone Encounter (Signed)
Dr. Lionel December note had specified a 10 day follow up that she should have scheduled before she left but did not.  I am going off of his last note.  Would appreciate patient scheduling so Dr. Madelyn Brunner can re-examine her.

## 2013-03-24 NOTE — Telephone Encounter (Signed)
Left detailed message for patient to call the office to schedule an appointment for follow up.  Any questions call the office.

## 2013-03-24 NOTE — Telephone Encounter (Signed)
LM for Pt to return call.

## 2013-03-29 NOTE — Patient Instructions (Signed)
Eating Healthy Foods: After Your Visit  Your Care Instructions  Eating healthy foods can help lower your risk for disease. Healthy food gives you energy and keeps your heart strong, your brain active, your muscles working, and your bones strong.  A healthy diet includes a variety of foods from the basic food groups: grains, vegetables, fruits, milk and milk products, and meat and beans. Some people may eat more of their favorite foods from only one food group and, as a result, miss getting the nutrients they need. So, it is important to pay attention not only to what you eat but also to what you are missing from your diet. You can eat a healthy, balanced diet by making a few small changes.  Follow-up care is a key part of your treatment and safety. Be sure to make and go to all appointments, and call your doctor if you are having problems. It???s also a good idea to know your test results and keep a list of the medicines you take.  How can you care for yourself at home?  Look at what you eat  ?? Keep a food diary for a week or two and record everything you eat or drink. Track the number of servings you eat from each food group.  ?? For a balanced diet every day, eat a variety of:  ?? 6 or more ounce-equivalents of grains, such as cereals, breads, crackers, rice, or pasta, every day. An ounce-equivalent is 1 slice of bread, 1 cup of ready-to-eat cereal, or ?? cup of cooked rice, cooked pasta, or cooked cereal.  ?? 2?? cups of vegetables, especially:  ?? Dark-green vegetables such as broccoli and spinach.  ?? Orange vegetables such as carrots and sweet potatoes.  ?? Dry beans (such as pinto and kidney beans) and peas (such as lentils).  ?? 2 cups of fresh, frozen, or canned fruit. A small apple or 1 banana or orange equals 1 cup.  ?? 3 cups of nonfat or low-fat milk, yogurt, or other milk products.  ?? 5?? ounces of meat and beans, such as chicken, fish, lean meat, beans, nuts, and seeds. One egg, 1 tablespoon of peanut butter, ??  ounce nuts or seeds, or ?? cup of cooked beans equals 1 ounce of meat.  ?? Learn how to read food labels for serving sizes and ingredients. Fast-food and convenience-food meals often contain few or no fruits or vegetables. Make sure you eat some fruits and vegetables to make the meal more nutritious.  ?? Look at your food diary. For each food group, add up what you have eaten and then divide the total by the number of days. This will give you an idea of how much you are eating from each food group. See if you can find some ways to change your diet to make it more healthy.  Start small  ?? Do not try to make dramatic changes to your diet all at once. You might feel that you are missing out on your favorite foods and then be more likely to fail.  ?? Start slowly, and gradually change your habits. Try some of the following:  ?? Use whole wheat bread instead of white bread.  ?? Use nonfat or low-fat milk instead of whole milk.  ?? Eat brown rice instead of white rice, and eat whole wheat pasta instead of white-flour pasta.  ?? Try low-fat cheeses and low-fat yogurt.  ?? Add more fruits and vegetables to meals and have them for snacks.  ??   Add lettuce, tomato, cucumber, and onion to sandwiches.  ?? Add fruit to yogurt and cereal.  Enjoy food  ?? You can still eat your favorite foods. You just may need to eat less of them. If your favorite foods are high in fat, salt, and sugar, limit how often you eat them, but do not cut them out entirely.  ?? Eat a wide variety of foods.  Make healthy choices when eating out  ?? The type of restaurant you choose can help you make healthy choices. Even fast-food chains are now offering more low-fat or healthier choices on the menu.  ?? Choose smaller portions, or take half of your meal home.  ?? When eating out, try:  ?? A veggie pizza with a whole wheat crust or grilled chicken (instead of sausage or pepperoni).  ?? Pasta with roasted vegetables, grilled chicken, or marinara sauce instead of cream sauce.   ?? A vegetable wrap or grilled chicken wrap.  ?? Broiled or poached food instead of fried or breaded items.  Make healthy choices easy  ?? Buy packaged, prewashed, ready-to-eat fresh vegetables and fruits, such as baby carrots, salad mixes, and chopped or shredded broccoli and cauliflower.  ?? Buy packaged, presliced fruits, such as melon or pineapple.  ?? Choose 100% fruit or vegetable juice instead of soda. Limit juice intake to 4 to 6 oz (?? to ?? cup) a day.  ?? Blend low-fat yogurt, fruit juice, and canned or frozen fruit to make a smoothie for breakfast or a snack.   Where can you learn more?   Go to http://www.healthwise.net/[facility]  Enter T756 in the search box to learn more about "Eating Healthy Foods: After Your Visit."   ?? 2006-2014 Healthwise, Incorporated. Care instructions adapted under license by [employer] (which disclaims liability or warranty for this information). This care instruction is for use with your licensed healthcare professional. If you have questions about a medical condition or this instruction, always ask your healthcare professional. Healthwise, Inc[employer] disclaims any warranty or liability for your use of this information.  Content Version: 10.2.346038; Current as of: Jul 11, 2011

## 2013-03-29 NOTE — Progress Notes (Signed)
Patient is in the office today for 10 day follow up.  Patient states she is not taking Singular or Zanaflex at this time.    1. Have you been to the ER, urgent care clinic since your last visit?  Hospitalized since your last visit?No    2. Have you seen or consulted any other health care providers outside of the [facility] since your last visit?  Include any pap smears or colon screening. No

## 2013-04-03 NOTE — Progress Notes (Signed)
Cathy [name] is a 63 y.o.  female and presents with Follow-up; Diarrhea; and Fatigue      SUBJECTIVE:  Pt's diarrhea has resolved. All her stool cultures were negative. Pt's fatigue is improving but she is afraid to exercise to improve her condition with recent syncopal episode.  I do not have her medical records from [place] of state hospital to see exactly what happened to her. She is due to see cardiology soon so she will talk to them about cardiac rehab.       Respiratory ROS: negative for - shortness of breath  Cardiovascular ROS: negative for - chest pain    Current Outpatient Prescriptions   Medication Sig   ??? lisinopril (PRINIVIL, ZESTRIL) 5 mg tablet Take 5 mg by mouth daily.   ??? fluticasone (FLONASE) 50 mcg/actuation nasal spray 2 sprays by Both Nostrils route daily.   ??? zolpidem (AMBIEN) 5 mg tablet TAKE ONE TABLET BY MOUTH NIGHTLY AS NEEDED FOR SLEEP.   ??? CRESTOR 5 mg tablet TAKE ONE TABLET BY MOUTH IN THE EVENING   ??? carvedilol (COREG) 12.5 mg tablet TAKE ONE TABLET BY MOUTH TWICE DAILY   ??? furosemide (LASIX) 40 mg tablet Take 1 Tab by mouth daily.     No current facility-administered medications for this visit.         OBJECTIVE:  alert, well appearing, and in no distress  BP 110/64    Pulse 90    Temp(Src) 97.8 ??F (36.6 ??C) (Oral)    Resp 18    Ht 5\' 10"  (1.778 m)    Wt 198 lb (89.812 kg)    BMI 28.41 kg/m2      SpO2 98%    well developed and well nourished  Chest - clear to auscultation, no wheezes, rales or rhonchi, symmetric air entry  Heart - normal rate, regular rhythm, normal S1, S2, no murmurs, rubs, clicks or gallops        Assessment/Plan      ICD-9-CM    1. CHF (congestive heart failure) 428.0 Improved after recent hospitalization and med changes. Pt to f/u with cardiology for further management. Consider cardiac rehab    2. Diarrhea 787.91 Improved on its own      Follow-up Disposition:  Return in about 4 months (around 07/27/2013) for labs 1 week before.    Reviewed plan of care. Patient  has provided input and agrees with goals.

## 2013-04-11 MED ORDER — CARVEDILOL 3.125 MG TAB
3.125 mg | ORAL_TABLET | ORAL | Status: DC
Start: 2013-04-11 — End: 2013-10-23

## 2013-04-11 MED ORDER — FUROSEMIDE 20 MG TAB
20 mg | ORAL_TABLET | Freq: Every day | ORAL | Status: DC
Start: 2013-04-11 — End: 2013-10-23

## 2013-04-11 MED ORDER — LISINOPRIL 5 MG TAB
5 mg | ORAL_TABLET | Freq: Every day | ORAL | Status: DC
Start: 2013-04-11 — End: 2013-10-23

## 2013-04-11 NOTE — Patient Instructions (Addendum)
Heart Transplant: Before Your Surgery  What is heart transplant surgery?     A heart transplant is surgery to take out a diseased heart and replace it with a donor heart.  The doctor will make a cut in the skin over your breastbone (sternum). The cut is called an incision. Then the doctor will cut through your sternum to reach your heart. During the surgery, a pump moves blood through your body. This is done while the doctor takes out the diseased heart and puts in a healthy heart from someone who has died.  The doctor will use wire to put your sternum back together. Stitches or staples are used to close the incisions in the skin over your sternum. The wire will stay in your chest. The incisions will leave scars. But the scars may fade with time.  You will spend about 1 to 2 weeks in the hospital after surgery. You may have to stay longer. This depends on your health and if you have problems from surgery.  You will likely start a cardiac rehabilitation (rehab) program in the hospital. You will continue with this rehab program after you go home to help you recover and prevent problems with your heart.  You probably will feel better each day. But it may take a few months to get your energy back.  Follow-up care is a key part of your treatment and safety. Be sure to make and go to all appointments, and call your doctor if you are having problems. It's also a good idea to know your test results and keep a list of the medicines you take.  What happens before surgery?  Surgery can be stressful. This information will help you understand what you can expect. And it will help you safely prepare for surgery.  Preparing for surgery  ?? Understand exactly what surgery is planned, along with the risks, benefits, and other options.  ?? Tell your doctors ALL the medicines, vitamins, supplements, and herbal remedies you take. Some of these can increase the risk of bleeding or interact with anesthesia.  ?? If you take blood thinners,  such as warfarin (Coumadin), clopidogrel (Plavix), or aspirin, be sure to talk to your doctor. He or she will tell you if you should stop taking these medicines before your surgery. Make sure that you understand exactly what your doctor wants you to do.  ?? Your doctor will tell you which medicines to take or stop before your surgery. You may need to stop taking certain medicines a week or more before surgery. So talk to your doctor as soon as you can.  ?? If you have an advance directive, let your doctor know. It may include a living will and a durable power of attorney for health care. Bring a copy to the hospital. If you don't have one, you may want to prepare one. It lets your doctor and loved ones know your health care wishes. Doctors advise that everyone prepare these papers before any type of surgery or procedure.  What happens on the day of surgery?  ?? Follow the instructions exactly about when to stop eating and drinking. If you don't, your surgery may be canceled. If your doctor told you to take your medicines on the day of surgery, take them with only a sip of water.  ?? Take a bath or shower before you come in for your surgery. Do not apply lotions, perfumes, deodorants, or nail polish.  ?? Do not shave the surgical site yourself.  ??  Take off all jewelry and piercings. And take out contact lenses, if you wear them.  At the hospital or surgery center  ?? Bring a picture ID.  ?? You will be kept comfortable and safe by your anesthesia provider. You will be asleep during the surgery.  ?? The surgery will take at least 4 hours.  ?? You will have a breathing tube down your throat. This is usually removed within 6 hours after surgery. You will not be able to talk or drink liquids while the tube is in your throat. After the tube is removed, your throat will feel dry and scratchy. Your nurse will tell you when it is safe to drink liquids again.  ?? You will have a thin plastic tube, called a catheter, in a vein in your  neck. It is used to keep track of how well your heart is working. This is usually removed in 1 to 3 days.  ?? You will have chest tubes to drain fluid and blood after surgery. The fluid and extra blood are normal and usually last only a few days. The chest tubes are usually removed in 1 or 2 days.  ?? You will have several thin wires coming out of your chest near your incision. These wires can help keep your heartbeat steady after surgery. They will be removed before you go home.  Going home  ?? Be sure you have someone to drive you home. Anesthesia and pain medicine make it unsafe for you to drive.  ?? You will be given more specific instructions about recovering from your surgery. They will cover things like diet, wound care, follow-up care, driving, and getting back to your normal routine.  When should you call your doctor?  ?? You have questions or concerns.  ?? You don't understand how to prepare for your surgery.  ?? You become ill before the surgery (such as fever, flu, or a cold).  ?? You need to reschedule or have changed your mind about having the surgery.   Where can you learn more?   Go to [email or web address]  Enter F172 in the search box to learn more about "Heart Transplant: Before Your Surgery."   ?? 2006-2014 [employer]. Care instructions adapted under license by [employer] (which disclaims liability or warranty for this information). This care instruction is for use with your licensed healthcare professional. If you have questions about a medical condition or this instruction, always ask your healthcare professional. [place] any warranty or liability for your use of this information.  Content Version: 10.2.346038; Current as of: September 09, 2011            Spoke to [facility] to set patient up for cardiac rehab. [phone]

## 2013-04-11 NOTE — Progress Notes (Signed)
1. Have you been to the ER, urgent care clinic since your last visit?  Hospitalized since your last visit?  Where: [facility]       2. Have you seen or consulted any other health care providers outside of the [place] since your last visit?  Include any pap smears or colon screening.      Where: [facility]     3.  Since your last visit, have you had any of the following symptoms?      No   4.  Have you had any blood work, X-rays or cardiac testing?      No       5.  Where do you normally have your labs drawn?       6. Do you need any refills today?   Yes

## 2013-04-11 NOTE — Progress Notes (Signed)
HISTORY OF PRESENT ILLNESS  Cathy [name] is a 63 y.o. female.    HPI Comments: Patient was admitted with Respiratory failure with chf,feels better after discharge    CHF  The history is provided by the patient. This is a chronic problem. The problem occurs constantly. The problem has not changed since onset.Associated symptoms include shortness of breath. Pertinent negatives include no chest pain. The symptoms are aggravated by exertion.   Shortness of Breath  The history is provided by the patient. This is a recurrent problem. The problem occurs intermittently.The problem has been gradually improving. Pertinent negatives include no fever, no cough, no sputum production, no hemoptysis, no wheezing, no PND, no orthopnea, no chest pain, no vomiting, no rash, no leg swelling and no claudication.       Review of Systems   Constitutional: Negative for fever and chills.   HENT: Negative for nosebleeds.    Eyes: Negative for blurred vision and double vision.   Respiratory: Positive for shortness of breath. Negative for cough, hemoptysis, sputum production and wheezing.    Cardiovascular: Negative for chest pain, palpitations, orthopnea, claudication, leg swelling and PND.   Gastrointestinal: Negative for heartburn, nausea and vomiting.   Musculoskeletal: Negative for myalgias.   Skin: Negative for rash.   Neurological: Negative for dizziness and weakness.   Endo/Heme/Allergies: Does not bruise/bleed easily.     Family History   Problem Relation Age of Onset   ??? Diabetes Mother    ??? Heart Attack Father      MI   ??? Heart Disease Maternal Grandmother    ??? Heart Disease Paternal Grandmother    ??? Kidney Disease Other      1 sib deceased   ??? Cancer Other      1 sib throat cancer   ??? Diabetes Other    ??? Diabetes Daughter    ??? Other Daughter      leukemia   ??? Hypertension Other        Past Medical History   Diagnosis Date   ??? CHF (congestive heart failure) 06/23/2008     Non-ischemic CMP , Cath (2008) Normal coronary  arteries   ??? 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 Fibroid 06/23/2008   ??? HTN (hypertension) 06/23/2008   ??? HLD (hyperlipidemia)    ??? Breast mass, left      benign   ??? OSA (obstructive sleep apnea) 9/10   ??? Chronic systolic heart failure      Stable,    ??? Automatic implantable cardiac defibrillator in situ      Post ddd icd Set up carelink   ??? Hypotension, unspecified      Related to lisinopril   ??? Venous reflux    ??? Left knee pain      With swelling   ??? Wears glasses    ??? Leg pain    ??? Degenerative arthritis of left knee    ??? 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 Hx tonsil and adenoidectomy     ??? Hx tubal ligation     ??? Hx total abdominal hysterectomy  1996   ??? Hx cholecystectomy     ??? Hx pacemaker       dual chamber icd   ??? Hx craniotomy  2013     meningioma    ???  Hx breast biopsy       rt breast, benign       Allergies   Allergen Reactions   ??? Tramadol Other (comments)     Psychotic Hallucinations   ??? Pcn [Penicillins] Itching     Itching bumps on hands and arms       Current Outpatient Prescriptions   Medication Sig   ??? lisinopril (PRINIVIL, ZESTRIL) 5 mg tablet Take 1 Tab by mouth daily.   ??? carvedilol (COREG) 3.125 mg tablet TAKE ONE TABLET BY MOUTH TWICE DAILY   ??? furosemide (LASIX) 20 mg tablet Take 1 Tab by mouth daily.   ??? fluticasone (FLONASE) 50 mcg/actuation nasal spray 2 sprays by Both Nostrils route daily.   ??? zolpidem (AMBIEN) 5 mg tablet TAKE ONE TABLET BY MOUTH NIGHTLY AS NEEDED FOR SLEEP.   ??? CRESTOR 5 mg tablet TAKE ONE TABLET BY MOUTH IN THE EVENING     No current facility-administered medications for this visit.       BP 113/56    Pulse 86    Ht 5\' 10"  (1.778 m)    Wt 85.276 kg (188 lb)    BMI 26.98 kg/m2         Physical Exam   Constitutional: She is oriented to person, place, and time. She appears well-developed and well-nourished.   HENT:   Head: Normocephalic and atraumatic.    Eyes: Conjunctivae are normal.   Neck: Neck supple. No JVD present. No tracheal deviation present. No thyromegaly present.   Cardiovascular: Normal rate and regular rhythm.  PMI is not displaced.  Exam reveals no gallop, no S3 and no decreased pulses.    Murmur heard.   Holosystolic murmur is present with a grade of 2/6  at the lower left sternal border and apex  Pulmonary/Chest: No respiratory distress. She has no wheezes. She has no rales. She exhibits no tenderness.   Abdominal: Soft. There is no tenderness.   Musculoskeletal: She exhibits no edema.   Neurological: She is alert and oriented to person, place, and time.   Skin: Skin is warm.   Psychiatric: She has a normal mood and affect.     CARDIOLOGY STUDIES 08/25/2010 10/25/2008 07/25/2008 01/25/2008 02/24/2006   Myocardial Perfusion Scan Result - - FIXED INF AND REVERSIBLE ANT APICAL DEFECT - -   Cardiac Cath Result - - - - EF 25%, NO SIGNIFICANT CAD   Echocardiogram - Complete Result DILATED 20%, MILD MR - - EF 25% -   Doppler US Arterial Result - NL SCAN - - -         Assessment      ICD-9-CM    1. Chronic systolic heart failure 621.30 REFERRAL TO CARDIAC REHAB    nyha class3  ef remains low  discussed vad/transplant option  will think it over  start rehab   2. 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 Other and unspecified hyperlipidemia 272.4    4. Automatic implantable cardiac defibrillator in situ V45.02    5. Hypotension, unspecified 458.9     stable  tolerating coreg and lisinopril   6. 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 Dhingra has a reminder for a "due or due soon" health maintenance. I have asked that she contact her primary care provider for follow-up on this health maintenance.    CARDIOLOGY STUDIES 08/25/2010 10/25/2008 07/25/2008 01/25/2008 02/24/2006   Myocardial  Perfusion Scan Result - - FIXED INF AND REVERSIBLE ANT APICAL DEFECT - -   Cardiac Cath Result - - - - EF 25%, NO SIGNIFICANT CAD   Echocardiogram - Complete Result  DILATED 20%, MILD MR - - EF 25% -   Doppler US Arterial Result - NL SCAN - - -     SUMMARY:echo:01/2013  Left ventricle: The ventricle was moderately to severely dilated. Systolic  function was moderately to markedly reduced. Ejection fraction was  estimated in the range of 20 % to 25 %. There was severe diffuse  hypokinesis. Features were consistent with a pseudonormal left ventricular  filling pattern, with concomitant abnormal relaxation and increased  filling pressure (grade 2 diastolic dysfunction). Intracavitary echoes  most suggestive of apical trabeculations were present.    Left atrium: The atrium was severely dilated. LA volume index was 91  ml/m squared.    Right atrium: The atrium was mildly dilated.    Mitral valve: There was mild annular calcification. There was mild diffuse  thickening of the anterior and posterior leaflets. In certain images the  leaflet tips exhibit minimally reduced coaptation. There was moderate to  severe regurgitation. Mean transmitral gradient was 2.7 mmHg. The  effective orifice of mitral regurgitation by proximal isovelocity surface  area was 0.22 cm squared. The volume of mitral regurgitation by proximal  isovelocity surface area was 39 ml.    Tricuspid valve: There was mild regurgitation. Pulmonary artery systolic  pressure: 56 mmHg.     Assessment      ICD-9-CM    1. Chronic systolic heart failure 428.22 REFERRAL TO CARDIAC REHAB    nyha class3  ef remains low  discussed vad/transplant option  will think it over  start rehab   2. 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 Other and unspecified hyperlipidemia 272.4    4. Automatic implantable cardiac defibrillator in situ V45.02    5. Hypotension, unspecified 458.9     stable  tolerating coreg and lisinopril   6. 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 lisinopril (PRINIVIL, ZESTRIL) 5 mg tablet Reorder   ??? carvedilol (COREG)  12.5 mg tablet Reorder   ??? furosemide (LASIX) 40 mg tablet Reorder       Orders Placed This Encounter   ??? REFERRAL TO CARDIAC REHAB Please evaluate patient for chf     Referral Priority:  Routine     Referral Type:  Consultation     Referral Reason:  Specialty Services Required   ??? lisinopril (PRINIVIL, ZESTRIL) 5 mg tablet     Sig: Take 1 Tab by mouth daily.     Dispense:  30 Tab     Refill:  6   ??? carvedilol (COREG) 3.125 mg tablet     Sig: TAKE ONE TABLET BY MOUTH TWICE DAILY     Dispense:  60 Tab     Refill:  6   ??? furosemide (LASIX) 20 mg tablet     Sig: Take 1 Tab by mouth daily.     Dispense:  30 Tab     Refill:  6       Follow-up Disposition:  Return in about 3 months (around 07/09/2013).

## 2013-04-13 NOTE — Telephone Encounter (Signed)
Regarding: Non-Urgent Medical Question  ----- Message from Tawnya Crook, LPN sent at 8/75/6433  4:11 PM EST -----       ----- Message from Myna Bright to Thana Farr, MD sent at 04/13/2013  3:57 PM -----   Please have Dr. Geoffry Paradise to call me please, it is very important.   Thank you  [place]  Andrey Spearman

## 2013-04-13 NOTE — Telephone Encounter (Signed)
Pt was referred  for Heart Transplant as well as cardiac rehab.  She asked my opinion. I advised her to do the Cardiac Rehab before her appointment with the transplant cardiologist.

## 2013-04-13 NOTE — Telephone Encounter (Signed)
I called the patient to schedule a medicare wellness and patient refused but would like to speak Dr.Hunte about needing a heart transplant and second opinion

## 2013-04-13 NOTE — Telephone Encounter (Signed)
-----   Message from [facility] sent at 04/13/2013  3:57 PM EST -----  Regarding: Non-Urgent Medical Question  Contact: [phone]  Please have Dr. Geoffry Paradise to call me please, it is very important.   Thank you  [place]  Andrey Spearman

## 2013-05-02 NOTE — Telephone Encounter (Signed)
Pt aware her insurance approved zolpidem & info was faxed to [employer].

## 2013-05-02 NOTE — Other (Signed)
Weekly Progress Note    Home exercise:  Pt walked over the weekend at the mall for 2 days    Nutrition Program:  Pt reported that she ate out at restaurants this weekend, and she is aware that she didn't make healthy choices.     Stress Management/RR Program: Pt did guided imagery for relaxation

## 2013-05-09 NOTE — Other (Signed)
Weekly Progress Note    Home exercise:  Pt did not do any exercise over the weekend  Nutrition Program:  Pt diet over the weekend consisted of [place] burger, sweet potato, salad, and fruits  Stress Management/RR Program:  Pt watched television

## 2013-05-23 NOTE — Other (Signed)
Weekly Progress Note    Home exercise:  Pt worked out at [employer] over th e weekend  Nutrition Program:  Pt diet over the weekend consisted of baked chicken, vegetables, and chinese food because patient was out of town for the weekend    Stress Management/RR Program:  Pt went to church and went shopping over the weekend

## 2013-05-30 NOTE — Other (Signed)
Weekly Progress Note    Home exercise:Pt walked over the weekend for exercise    Nutrition Program:  Pt diet over the weekend consisted of salads, fruits, vegetables, and chicken    Stress Management/RR Program:Pt went to church for relaxation.

## 2013-06-06 NOTE — Other (Signed)
Weekly Progress Note    Home exercise: Patient stated that she did housework for exercise over the weekend. No complaints.       Nutrition Program: Patient stated that she ate well. Had chicken vegetables and fruit.       Stress Management/RR Program: watched tv

## 2013-06-13 NOTE — Other (Signed)
Weekly Progress Note    Home exercise: She stated that she walked for 1 hour over weekend. No complaints.       Nutrition Program: Patient stated that she ate a fried chicken wing-stated just one. Also had cabbage, green beans, baked chicken, home made soup. Stated she feels she is doing pretty well with her diet.       Stress Management/RR Program: Stated she watched tv and went to church for relaxation.

## 2013-06-20 NOTE — Other (Signed)
Weekly Progress Note    Home exercise:Patient stated that she walked 1 hour x 2 days. No complaints reported.       Nutrition Program: Patient stated that she ate chicken and rice Fri and Sat. Stated that Nancy Fetter she went and got a steak with baked sweet potato. Stated that she only had coffee for breakfast. Encouraged her to have a little something for breakfast and lunch.       Stress Management/RR Program: Patient stated that she sat and watched tv for relaxation.

## 2013-07-21 NOTE — Progress Notes (Signed)
1. Have you been to the ER, urgent care clinic since your last visit?  Hospitalized since your last visit?     No    2. Have you seen or consulted any other health care providers outside of the [facility] since your last visit?  Include any pap smears or colon screening.      Yes Where: Dr. Madelyn Brunner

## 2013-07-21 NOTE — Progress Notes (Signed)
HISTORY OF PRESENT ILLNESS  Cathy [name] is a 63 y.o. female.    CHF  The history is provided by the patient. This is a chronic problem. The problem occurs constantly. The problem has not changed since onset.Associated symptoms include shortness of breath. Pertinent negatives include no chest pain. The symptoms are aggravated by exertion.   Shortness of Breath  The history is provided by the patient. This is a recurrent problem. The problem occurs intermittently.The problem has been gradually improving. Pertinent negatives include no fever, no cough, no sputum production, no hemoptysis, no wheezing, no PND, no orthopnea, no chest pain, no vomiting, no rash, no leg swelling and no claudication.       Review of Systems   Constitutional: Negative for fever and chills.   HENT: Negative for nosebleeds.    Eyes: Negative for blurred vision and double vision.   Respiratory: Positive for shortness of breath. Negative for cough, hemoptysis, sputum production and wheezing.    Cardiovascular: Negative for chest pain, palpitations, orthopnea, claudication, leg swelling and PND.   Gastrointestinal: Negative for heartburn, nausea and vomiting.   Musculoskeletal: Negative for myalgias.   Skin: Negative for rash.   Neurological: Negative for dizziness and weakness.   Endo/Heme/Allergies: Does not bruise/bleed easily.     Family History   Problem Relation Age of Onset   ??? Diabetes Mother    ??? Heart Attack Father      MI   ??? Heart Disease Maternal Grandmother    ??? Heart Disease Paternal Grandmother    ??? Kidney Disease Other      1 sib deceased   ??? Cancer Other      1 sib throat cancer   ??? Diabetes Other    ??? Diabetes Daughter    ??? Other Daughter      leukemia   ??? Hypertension Other        Past Medical History   Diagnosis Date   ??? CHF (congestive heart failure) ([place]) 06/23/2008     Non-ischemic CMP , Cath (2008) Normal coronary arteries   ??? 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 Fibroid  06/23/2008   ??? HTN (hypertension) 06/23/2008   ??? HLD (hyperlipidemia)    ??? Breast mass, left      benign   ??? OSA (obstructive sleep apnea) 9/10   ??? Chronic systolic heart failure (HCC)      Stable,    ??? Automatic implantable cardiac defibrillator in situ      Post ddd icd Set up carelink   ??? Hypotension, unspecified      Related to lisinopril   ??? Venous reflux    ??? Left knee pain      With swelling   ??? Wears glasses    ??? Leg pain    ??? Degenerative arthritis of left knee    ??? 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 Hx tonsil and adenoidectomy     ??? Hx tubal ligation     ??? Hx total abdominal hysterectomy  1996   ??? Hx cholecystectomy     ??? Hx pacemaker       dual chamber icd   ??? Hx craniotomy  2013     meningioma    ??? Hx breast biopsy       rt  breast, benign   ??? Hx heent       glasses       Allergies   Allergen Reactions   ??? Tramadol Other (comments)     Psychotic Hallucinations   ??? Pcn [Penicillins] Itching     Itching bumps on hands and arms       Current Outpatient Prescriptions   Medication Sig   ??? lisinopril (PRINIVIL, ZESTRIL) 5 mg tablet Take 1 Tab by mouth daily.   ??? carvedilol (COREG) 3.125 mg tablet TAKE ONE TABLET BY MOUTH TWICE DAILY   ??? furosemide (LASIX) 20 mg tablet Take 1 Tab by mouth daily.   ??? fluticasone (FLONASE) 50 mcg/actuation nasal spray 2 sprays by Both Nostrils route daily.   ??? zolpidem (AMBIEN) 5 mg tablet TAKE ONE TABLET BY MOUTH NIGHTLY AS NEEDED FOR SLEEP.   ??? CRESTOR 5 mg tablet TAKE ONE TABLET BY MOUTH IN THE EVENING     No current facility-administered medications for this visit.       BP 113/60   Pulse 76   Ht 5\' 10"  (1.778 m)   Wt 83.462 kg (184 lb)   BMI 26.40 kg/m2      Physical Exam   Constitutional: She is oriented to person, place, and time. She appears well-developed and well-nourished.   HENT:   Head: Normocephalic and atraumatic.   Eyes: Conjunctivae are normal.   Neck: Neck supple. No JVD present. No  tracheal deviation present. No thyromegaly present.   Cardiovascular: Normal rate and regular rhythm.  PMI is not displaced.  Exam reveals no gallop, no S3 and no decreased pulses.    Murmur heard.   Holosystolic murmur is present with a grade of 2/6  at the lower left sternal border and apex  Pulmonary/Chest: No respiratory distress. She has no wheezes. She has no rales. She exhibits no tenderness.   Abdominal: Soft. There is no tenderness.   Musculoskeletal: She exhibits no edema.   Neurological: She is alert and oriented to person, place, and time.   Skin: Skin is warm.   Psychiatric: She has a normal mood and affect.     CARDIOLOGY STUDIES 08/25/2010 10/25/2008 07/25/2008 01/25/2008 02/24/2006   Myocardial Perfusion Scan Result - - FIXED INF AND REVERSIBLE ANT APICAL DEFECT - -   Cardiac Cath Result - - - - EF 25%, NO SIGNIFICANT CAD   Echocardiogram - Complete Result DILATED 20%, MILD MR - - EF 25% -   Doppler US Arterial Result - NL SCAN - - -         Assessment      ICD-9-CM   1. Chronic systolic heart failure (HCC) 428.22    stable  nyha class2  finished rehab   2. Cardiomyopathy ([place]) 425.4    20-25% ef   3. Essential hypertension 401.9    controlled   4. Mitral valve disorders 424.0    moderate to severe   5. AICD (automatic cardioverter/defibrillator) present V45.02    normal function       Ms. Brinkmeier has a reminder for a "due or due soon" health maintenance. I have asked that she contact her primary care provider for follow-up on this health maintenance.    CARDIOLOGY STUDIES 08/25/2010 10/25/2008 07/25/2008 01/25/2008 02/24/2006   Myocardial Perfusion Scan Result - - FIXED INF AND REVERSIBLE ANT APICAL DEFECT - -   Cardiac Cath Result - - - - EF 25%, NO SIGNIFICANT CAD   Echocardiogram - Complete Result DILATED 20%, MILD  MR - - EF 25% -   Doppler US Arterial Result - NL SCAN - - -     SUMMARY:echo:01/2013  Left ventricle: The ventricle was moderately to severely dilated. Systolic  function was moderately to markedly  reduced. Ejection fraction was  estimated in the range of 20 % to 25 %. There was severe diffuse  hypokinesis. Features were consistent with a pseudonormal left ventricular  filling pattern, with concomitant abnormal relaxation and increased  filling pressure (grade 2 diastolic dysfunction). Intracavitary echoes  most suggestive of apical trabeculations were present.    Left atrium: The atrium was severely dilated. LA volume index was 91  ml/m squared.    Right atrium: The atrium was mildly dilated.    Mitral valve: There was mild annular calcification. There was mild diffuse  thickening of the anterior and posterior leaflets. In certain images the  leaflet tips exhibit minimally reduced coaptation. There was moderate to  severe regurgitation. Mean transmitral gradient was 2.7 mmHg. The  effective orifice of mitral regurgitation by proximal isovelocity surface  area was 0.22 cm squared. The volume of mitral regurgitation by proximal  isovelocity surface area was 39 ml.    Tricuspid valve: There was mild regurgitation. Pulmonary artery systolic  pressure: 56 mmHg.     Assessment      ICD-9-CM   1. Chronic systolic heart failure (HCC) 428.22    stable  nyha class2  finished rehab   2. Cardiomyopathy ([place]) 425.4    20-25% ef   3. Essential hypertension 401.9    controlled   4. Mitral valve disorders 424.0    moderate to severe   5. AICD (automatic cardioverter/defibrillator) present V45.02    normal function       There are no discontinued medications.    No orders of the defined types were placed in this encounter.       Follow-up Disposition:  Return in about 3 months (around 10/21/2013).

## 2013-07-29 MED ORDER — ROSUVASTATIN 5 MG TAB
5 mg | ORAL_TABLET | ORAL | Status: DC
Start: 2013-07-29 — End: 2013-07-29

## 2013-07-29 MED ORDER — ROSUVASTATIN 5 MG TAB
5 mg | ORAL_TABLET | ORAL | Status: DC
Start: 2013-07-29 — End: 2013-09-02

## 2013-07-29 NOTE — Telephone Encounter (Signed)
From: Cathy [name]  To: Thana Farr, MD  Sent: 07/29/2013 2:07 PM EDT  Subject: Medication Renewal Request    Original authorizing provider: Thana Farr, MD    Cathy [name] would like a refill of the following medications:  rosuvastatin (CRESTOR) 5 mg tablet Thana Farr, MD]    Preferred pharmacy: [facility] [place], [place]:

## 2013-07-29 NOTE — Telephone Encounter (Signed)
Last OV 03/29/2013

## 2013-07-29 NOTE — Telephone Encounter (Signed)
-----   Message from Generic Mychart sent at 07/29/2013 10:07 AM EDT -----  Regarding: Non-Urgent Medical Question  Contact: [phone]  May i have a refill for crestor please, thank you.

## 2013-08-25 NOTE — Progress Notes (Signed)
Lab drawn

## 2013-08-26 LAB — LIPID PANEL
Cholesterol, total: 168 mg/dL (ref 100–199)
HDL Cholesterol: 61 mg/dL (ref >39)
LDL, calculated: 96 mg/dL (ref 0–99)
Triglyceride: 54 mg/dL (ref 0–149)
VLDL, calculated: 11 mg/dL (ref 5–40)

## 2013-08-26 LAB — METABOLIC PANEL, COMPREHENSIVE
A-G Ratio: 1.8 (ref 1.1–2.5)
ALT (SGPT): 24 IU/L (ref 0–32)
AST (SGOT): 17 IU/L (ref 0–40)
Albumin: 4.2 g/dL (ref 3.6–4.8)
Alk. phosphatase: 94 IU/L (ref 39–117)
BUN/Creatinine ratio: 23 (ref 11–26)
BUN: 17 mg/dL (ref 8–27)
Bilirubin, total: 0.4 mg/dL (ref 0.0–1.2)
CO2: 27 mmol/L (ref 18–29)
Calcium: 9.1 mg/dL (ref 8.7–10.3)
Chloride: 99 mmol/L (ref 97–108)
Creatinine: 0.75 mg/dL (ref 0.57–1.00)
GFR est AA: 98 mL/min/{1.73_m2} (ref >59)
GFR est non-AA: 85 mL/min/{1.73_m2} (ref >59)
GLOBULIN, TOTAL: 2.3 g/dL (ref 1.5–4.5)
Glucose: 78 mg/dL (ref 65–99)
Potassium: 3.9 mmol/L (ref 3.5–5.2)
Protein, total: 6.5 g/dL (ref 6.0–8.5)
Sodium: 143 mmol/L (ref 134–144)

## 2013-08-26 LAB — VITAMIN D, 25 HYDROXY: VITAMIN D, 25-HYDROXY: 26.6 ng/mL — ABNORMAL LOW (ref 30.0–100.0)

## 2013-08-26 LAB — TSH 3RD GENERATION: TSH: 2.33 u[IU]/mL (ref 0.450–4.500)

## 2013-08-26 LAB — CVD REPORT

## 2013-09-02 MED ORDER — ROSUVASTATIN 5 MG TAB
5 mg | ORAL_TABLET | ORAL | Status: DC
Start: 2013-09-02 — End: 2014-09-16

## 2013-09-02 NOTE — Progress Notes (Signed)
Patient is in the office today for 4 month follow up.    Do you have an Advance Directive no  Do you want more information no    1. Have you been to the ER, urgent care clinic since your last visit?  Hospitalized since your last visit?No    2. Have you seen or consulted any other health care providers outside of the [facility] since your last visit?  Include any pap smears or colon screening. No

## 2013-09-02 NOTE — Patient Instructions (Signed)
A Healthy Lifestyle: After Your Visit  Your Care Instructions  A healthy lifestyle can help you feel good, stay at a healthy weight, and have plenty of energy for both work and play. A healthy lifestyle is something you can share with your whole family.  A healthy lifestyle also can lower your risk for serious health problems, such as high blood pressure, heart disease, and diabetes.  You can follow a few steps listed below to improve your health and the health of your family.  Follow-up care is a key part of your treatment and safety. Be sure to make and go to all appointments, and call your doctor if you are having problems. It???s also a good idea to know your test results and keep a list of the medicines you take.  How can you care for yourself at home?  ?? Do not eat too much sugar, fat, or fast foods. You can still have dessert and treats now and then. The goal is moderation.  ?? Start small to improve your eating habits. Pay attention to portion sizes, drink less juice and soda pop, and eat more fruits and vegetables.  ?? Eat a healthy amount of food. A 3-ounce serving of meat, for example, is about the size of a deck of cards. Fill the rest of your plate with vegetables and whole grains.  ?? Limit the amount of soda and sports drinks you have every day. Drink more water when you are thirsty.  ?? Eat at least 5 servings of fruits and vegetables every day. It may seem like a lot, but it is not hard to reach this goal. A serving or helping is 1 piece of fruit, 1 cup of vegetables, or 2 cups of leafy, raw vegetables. Have an apple or some carrot sticks as an afternoon snack instead of a candy bar. Try to have fruits and/or vegetables at every meal.  ?? Make exercise part of your daily routine. You may want to start with simple activities, such as walking, bicycling, or slow swimming. Try to be active 30 to 60 minutes every day. You do not need to do all 30 to 60  minutes all at once. For example, you can exercise 3 times a day for 10 or 20 minutes. Moderate exercise is safe for most people, but it is always a good idea to talk to your doctor before starting an exercise program.  ?? Keep moving. Mow the lawn, work in the garden, or clean your house. Take the stairs instead of the elevator at work.  ?? If you smoke, quit. People who smoke have an increased risk for heart attack, stroke, cancer, and other lung illnesses. Quitting is hard, but there are ways to boost your chance of quitting tobacco for good.  ?? Use nicotine gum, patches, or lozenges.  ?? Ask your doctor about stop-smoking programs and medicines.  ?? Keep trying.  In addition to reducing your risk of diseases in the future, you will notice some benefits soon after you stop using tobacco. If you have shortness of breath or asthma symptoms, they will likely get better within a few weeks after you quit.  ?? Limit how much alcohol you drink. Moderate amounts of alcohol (up to 2 drinks a day for men, 1 drink a day for women) are okay. But drinking too much can lead to liver problems, high blood pressure, and other health problems.  Family health  If you have a family, there are many things you can   do together to improve your health.  ?? Eat meals together as a family as often as possible.  ?? Eat healthy foods. This includes fruits, vegetables, lean meats and dairy, and whole grains.  ?? Include your family in your fitness plan. Most people think of activities such as jogging or tennis as the way to fitness, but there are many ways you and your family can be more active. Anything that makes you breathe hard and gets your heart pumping is exercise. Here are some tips:  ?? Walk to do errands or to take your child to school or the bus.  ?? Go for a family bike ride after dinner instead of watching TV.   Where can you learn more?   Go to http://www.healthwise.net/[facility]   Enter U807 in the search box to learn more about "A Healthy Lifestyle: After Your Visit."   ?? 2006-2015 Healthwise, Inc[employer]. Care instructions adapted under license by [employer] (which disclaims liability or warranty for this information). This care instruction is for use with your licensed healthcare professional. If you have questions about a medical condition or this instruction, always ask your healthcare professional. Healthwise, Incorporated disclaims any warranty or liability for your use of this information.  Content Version: 10.5.422740; Current as of: January 07, 2013

## 2013-09-06 NOTE — Progress Notes (Signed)
Subjective:       Chief Complaint  The patient presents for follow up of hypertension and high cholesterol. CHF         HPI  Cathy [name] is a 63 y.o. female seen for follow up of hyperlipidemia.  Shealso has hypertension. Hypertension well controlled, no significant medication side effects noted, on Coreg and lisinopril, hyperlipidemia reasonably well controlled, no significant medication side effects noted, on Crestor. Pt has had difficulty tolerating other statins in the past    Diet and Lifestyle: generally follows a low fat low cholesterol diet    Home BP Monitoring: is not measured at home.    Other symptoms and concerns: Cardiovascular Review:  The patient has CHF/Cardiomyopathy   Diet and Lifestyle: generally follows a low fat low cholesterol diet, exercises sporadically  Home BP Monitoring: is not measured at home.  Pertinent ROS: taking medications as instructed, no medication side effects noted, no TIA's, no chest pain on exertion, no dyspnea on exertion, no swelling of ankles. Pt recently did Cardiac Rehab which significantly improved her shortness of breath.     Vit D deficiency is mild. Pt currently not on any supplementation.      Discussed the patient's BMI with her.  The BMI follow up plan is as follows: BMI is out of normal parameters and plan is as follows: I have counseled this patient on diet and exercise regimens.      Current Outpatient Prescriptions   Medication Sig   ??? rosuvastatin (CRESTOR) 5 mg tablet TAKE ONE TABLET BY MOUTH IN THE EVENING   ??? lisinopril (PRINIVIL, ZESTRIL) 5 mg tablet Take 1 Tab by mouth daily.   ??? carvedilol (COREG) 3.125 mg tablet TAKE ONE TABLET BY MOUTH TWICE DAILY   ??? furosemide (LASIX) 20 mg tablet Take 1 Tab by mouth daily.   ??? fluticasone (FLONASE) 50 mcg/actuation nasal spray 2 sprays by Both Nostrils route daily.     No current facility-administered medications for this visit.             Review of Systems   Respiratory: negative for dyspnea on exertion  Cardiovascular: negative for chest pain    Objective:     BP 102/60 mmHg   Pulse 69   Temp(Src) 98.3 ??F (36.8 ??C) (Oral)   Resp 20   Ht 5' 10"  (1.778 m)   Wt 186 lb (84.369 kg)   BMI 26.69 kg/m2   SpO2 97%     General appearance - alert, well appearing, and in no distress  Neck - supple, no significant adenopathy  Chest - clear to auscultation, no wheezes, rales or rhonchi, symmetric air entry  Heart - normal rate, regular rhythm, normal S1, S2, no murmurs, rubs, clicks or gallops  Extremities - peripheral pulses normal, no pedal edema, no clubbing or cyanosis  Skin - normal coloration and turgor, no rashes, no suspicious skin lesions noted      Labs:   Lab Results   Component Value Date/Time    CHOLESTEROL, TOTAL 168 08/25/2013 08:35 AM    HDL CHOLESTEROL 61 08/25/2013 08:35 AM    LDL, CALCULATED 96 08/25/2013 08:35 AM    TRIGLYCERIDE 54 08/25/2013 08:35 AM    CHOL/HDL RATIO 3.4 11/23/2008 11:55 AM     Lab Results   Component Value Date/Time    ALT 24 08/25/2013 08:35 AM    AST 17 08/25/2013 08:35 AM    ALK. PHOSPHATASE 94 08/25/2013 08:35 AM    BILIRUBIN, DIRECT 0.1  06/07/2008 10:46 AM    BILIRUBIN, TOTAL 0.4 08/25/2013 08:35 AM     Lab Results   Component Value Date/Time    GFR EST AA 98 08/25/2013 08:35 AM    GFR EST NON-AA 85 08/25/2013 08:35 AM    CREATININE (POC) 0.9 07/21/2012 08:08 AM    CREATININE 0.75 08/25/2013 08:35 AM    BUN 17 08/25/2013 08:35 AM    SODIUM 143 08/25/2013 08:35 AM    POTASSIUM 3.9 08/25/2013 08:35 AM    CHLORIDE 99 08/25/2013 08:35 AM    CO2 27 08/25/2013 08:35 AM            Assessment / Plan     Hypertension well controlled, on Coreg   Hyperlipidemia reasonably well controlled, on Low dose Crestor, will not increase it at this time due to concerns about myalgias      ICD-9-CM    1. Essential hypertension 433.2 METABOLIC PANEL, COMPREHENSIVE     [id number] - [facility] COLLECTION     [id number] [facility] DRAW FEE    2. High cholesterol 272.0 rosuvastatin (CRESTOR) 5 mg tablet     LIPID PANEL   3. Cardiomyopathy ([place]) 425.4 Well controlled and much improved with cardiac rehab                Low cholesterol diet, weight control and daily exercise discussed.     Follow-up Disposition:  Return in about 6 months (around 03/05/2014) for labs 1 week before.      Reviewed plan of care. Patient has provided input and agrees with goals.

## 2013-09-28 ENCOUNTER — Telehealth

## 2013-09-28 NOTE — Telephone Encounter (Signed)
Pt was called and informed on holding her coreg and lisinopril if bp is less than a 100 or HR is less that 60. And Get BMP needs to come in office soon or go to ER.       Lab order is pending to be signed for patient to pick up.

## 2013-09-28 NOTE — Telephone Encounter (Signed)
Lab order signed

## 2013-09-28 NOTE — Telephone Encounter (Signed)
Hold coreg and lisinopril if BP less than a 100 or HR less than 60 hold Coreg also BP low. Get BMP needs to come to he office soon or go to the ER.

## 2013-09-28 NOTE — Telephone Encounter (Signed)
Patient called and stated that she is taking carvedilol 3.25 bid and lisinopril 5 mg and her blood pressure is running 70/40,71/41 and she is having dizziness, headaches and nausea. Please advise.

## 2013-09-28 NOTE — Telephone Encounter (Signed)
-----   Message from Generic Mychart sent at 09/27/2013 10:14 PM EDT -----  Regarding: Prescription Question  Contact: [phone]  Good Evening,  The $3.00 Crestor coupon that I received from your office was not accepted by my insurance company([employer]).  Do you have a solution? Or can you change my medication to something I can afford. [facility] wanted $63.00 for the Crestor.  Thank you  Andrey Spearman  [phone]

## 2013-10-24 MED ORDER — CARVEDILOL 3.125 MG TAB
3.125 mg | ORAL_TABLET | ORAL | Status: DC
Start: 2013-10-24 — End: 2014-10-16

## 2013-10-24 MED ORDER — FUROSEMIDE 20 MG TAB
20 mg | ORAL_TABLET | ORAL | Status: DC
Start: 2013-10-24 — End: 2014-10-16

## 2013-10-24 MED ORDER — CARVEDILOL 3.125 MG TAB
3.125 mg | ORAL_TABLET | ORAL | Status: DC
Start: 2013-10-24 — End: 2013-11-08

## 2013-10-24 MED ORDER — FUROSEMIDE 20 MG TAB
20 mg | ORAL_TABLET | ORAL | Status: DC
Start: 2013-10-24 — End: 2013-11-08

## 2013-10-24 MED ORDER — LISINOPRIL 5 MG TAB
5 mg | ORAL_TABLET | ORAL | Status: DC
Start: 2013-10-24 — End: 2013-11-08

## 2013-10-24 MED ORDER — LISINOPRIL 5 MG TAB
5 mg | ORAL_TABLET | ORAL | Status: DC
Start: 2013-10-24 — End: 2014-10-16

## 2013-10-25 NOTE — Telephone Encounter (Signed)
Pt dropped off a 68 Certification form to be completed.    When finish please give pt a call to pick up.

## 2013-10-25 NOTE — Telephone Encounter (Signed)
Will call patient when completed.

## 2013-11-01 NOTE — Telephone Encounter (Signed)
Left message for patient form is ready for pick up from the front office.

## 2013-11-08 NOTE — Progress Notes (Signed)
HISTORY OF PRESENT ILLNESS  Cathy [name] is a 63 y.o. female.    CHF  The history is provided by the patient. This is a chronic problem. The problem occurs constantly. The problem has not changed since onset.Associated symptoms include shortness of breath. Pertinent negatives include no chest pain. The symptoms are aggravated by exertion.   Shortness of Breath  The history is provided by the patient. This is a recurrent problem. The problem occurs intermittently.The problem has been gradually improving. Pertinent negatives include no fever, no cough, no sputum production, no hemoptysis, no wheezing, no PND, no orthopnea, no chest pain, no vomiting, no rash, no leg swelling and no claudication.       Review of Systems   Constitutional: Negative for fever and chills.   HENT: Negative for nosebleeds.    Eyes: Negative for blurred vision and double vision.   Respiratory: Positive for shortness of breath. Negative for cough, hemoptysis, sputum production and wheezing.    Cardiovascular: Negative for chest pain, palpitations, orthopnea, claudication, leg swelling and PND.   Gastrointestinal: Negative for heartburn, nausea and vomiting.   Musculoskeletal: Negative for myalgias.   Skin: Negative for rash.   Neurological: Negative for dizziness and weakness.   Endo/Heme/Allergies: Does not bruise/bleed easily.     Family History   Problem Relation Age of Onset   ??? Diabetes Mother    ??? Heart Attack Father      MI   ??? Heart Disease Maternal Grandmother    ??? Heart Disease Paternal Grandmother    ??? Kidney Disease Other      1 sib deceased   ??? Cancer Other      1 sib throat cancer   ??? Diabetes Other    ??? Diabetes Daughter    ??? Other Daughter      leukemia   ??? Hypertension Other        Past Medical History   Diagnosis Date   ??? CHF (congestive heart failure) ([place]) 06/23/2008     Non-ischemic CMP , Cath (2008) Normal coronary arteries   ??? 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 Fibroid 06/23/2008   ??? HTN (hypertension) 06/23/2008   ??? HLD (hyperlipidemia)    ??? Breast mass, left      benign   ??? OSA (obstructive sleep apnea) 9/10   ??? Chronic systolic heart failure (HCC)      Stable,    ??? Automatic implantable cardiac defibrillator in situ      Post ddd icd Set up carelink   ??? Hypotension, unspecified      Related to lisinopril   ??? Venous reflux    ??? Left knee pain      With swelling   ??? Wears glasses    ??? Leg pain    ??? Degenerative arthritis of left knee    ??? 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 Hx tonsil and adenoidectomy     ??? Hx tubal ligation     ??? Hx total abdominal hysterectomy  1996   ??? Hx cholecystectomy     ??? Hx pacemaker       dual chamber icd   ??? Hx craniotomy  2013     meningioma    ??? Hx breast biopsy       rt  breast, benign   ??? Hx heent       glasses       Allergies   Allergen Reactions   ??? Tramadol Other (comments)     Psychotic Hallucinations   ??? Pcn [Penicillins] Itching     Itching bumps on hands and arms       Current Outpatient Prescriptions   Medication Sig   ??? lisinopril (PRINIVIL, ZESTRIL) 5 mg tablet TAKE ONE TABLET BY MOUTH DAILY   ??? furosemide (LASIX) 20 mg tablet TAKE ONE TABLET BY MOUTH DAILY   ??? carvedilol (COREG) 3.125 mg tablet TAKE ONE TABLET BY MOUTH TWICE A DAY   ??? rosuvastatin (CRESTOR) 5 mg tablet TAKE ONE TABLET BY MOUTH IN THE EVENING     No current facility-administered medications for this visit.       BP 101/51 mmHg   Pulse 79   Ht 5\' 10"  (1.778 m)   Wt 83.462 kg (184 lb)   BMI 26.40 kg/m2      Physical Exam   Constitutional: She is oriented to person, place, and time. She appears well-developed and well-nourished.   HENT:   Head: Normocephalic and atraumatic.   Eyes: Conjunctivae are normal.   Neck: Neck supple. No JVD present. No tracheal deviation present. No thyromegaly present.   Cardiovascular: Normal rate and regular rhythm.  PMI is not displaced.   Exam reveals no gallop, no S3 and no decreased pulses.    Murmur heard.   Holosystolic murmur is present with a grade of 2/6  at the lower left sternal border and apex  Pulmonary/Chest: No respiratory distress. She has no wheezes. She has no rales. She exhibits no tenderness.   Abdominal: Soft. There is no tenderness.   Musculoskeletal: She exhibits no edema.   Neurological: She is alert and oriented to person, place, and time.   Skin: Skin is warm.   Psychiatric: She has a normal mood and affect.     CARDIOLOGY STUDIES 08/25/2010 10/25/2008 07/25/2008 01/25/2008 02/24/2006   Myocardial Perfusion Scan Result - - FIXED INF AND REVERSIBLE ANT APICAL DEFECT - -   Cardiac Cath Result - - - - EF 25%, NO SIGNIFICANT CAD   Echocardiogram - Complete Result DILATED 20%, MILD MR - - EF 25% -   Doppler US Arterial Result - NL SCAN - - -           Cathy [name] has a reminder for a "due or due soon" health maintenance. I have asked that she contact her primary care provider for follow-up on this health maintenance.    SUMMARY:echo:01/2013  Left ventricle: The ventricle was moderately to severely dilated. Systolic  function was moderately to markedly reduced. Ejection fraction was  estimated in the range of 20 % to 25 %. There was severe diffuse  hypokinesis. Features were consistent with a pseudonormal left ventricular  filling pattern, with concomitant abnormal relaxation and increased  filling pressure (grade 2 diastolic dysfunction). Intracavitary echoes  most suggestive of apical trabeculations were present.    Left atrium: The atrium was severely dilated. LA volume index was 91  ml/m squared.    Right atrium: The atrium was mildly dilated.    Mitral valve: There was mild annular calcification. There was mild diffuse  thickening of the anterior and posterior leaflets. In certain images the  leaflet tips exhibit minimally reduced coaptation. There was moderate to  severe regurgitation. Mean transmitral gradient was 2.7 mmHg. The   effective orifice of mitral regurgitation  by proximal isovelocity surface  area was 0.22 cm squared. The volume of mitral regurgitation by proximal  isovelocity surface area was 39 ml.    Tricuspid valve: There was mild regurgitation. Pulmonary artery systolic  pressure: 56 mmHg.     Assessment      ICD-9-CM ICD-10-CM    1. Chronic systolic heart failure (HCC) 428.22 I50.22     Stable, no significant fluid overload  C/o sob on exertion, nyha class 2-3, feels better  Unable to take lisinopril due to low bp  Will continue coreg   2. Mitral valve disorders 424.0 I05.9     mr-stable    3. 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 AICD (automatic cardioverter/defibrillator) present V45.02 Z95.810     normal function   5. Hypotension, unspecified 458.9 I95.9     stable  better after adjusting meds       Medications Discontinued During This Encounter   Medication Reason   ??? carvedilol (COREG) 5.427 mg tablet Duplicate Order   ??? furosemide (LASIX) 20 mg tablet Duplicate Order   ??? lisinopril (PRINIVIL, ZESTRIL) 5 mg tablet Duplicate Order   ??? fluticasone (FLONASE) 50 mcg/actuation nasal spray Not A Current Medication       No orders of the defined types were placed in this encounter.       Follow-up Disposition:  Return in about 6 months (around 05/09/2014).

## 2014-02-13 ENCOUNTER — Institutional Professional Consult (permissible substitution): Admit: 2014-02-13 | Discharge: 2014-02-13 | Payer: MEDICARE | Attending: Specialist | Primary: Family Medicine

## 2014-02-13 DIAGNOSIS — I5022 Chronic systolic (congestive) heart failure: Secondary | ICD-10-CM

## 2014-02-20 ENCOUNTER — Inpatient Hospital Stay: Admit: 2014-02-20 | Payer: MEDICARE | Primary: Family Medicine

## 2014-02-20 ENCOUNTER — Other Ambulatory Visit: Admit: 2014-02-20 | Discharge: 2014-02-20 | Payer: MEDICARE | Attending: Internal Medicine | Primary: Family Medicine

## 2014-02-20 DIAGNOSIS — I10 Essential (primary) hypertension: Secondary | ICD-10-CM

## 2014-02-21 LAB — LIPID PANEL
CHOL/HDL Ratio: 2.3 (ref 0–5.0)
Cholesterol, total: 158 MG/DL (ref <200)
HDL Cholesterol: 68 MG/DL — ABNORMAL HIGH (ref 40–60)
LDL, calculated: 79.2 MG/DL (ref 0–100)
Triglyceride: 54 MG/DL (ref <150)
VLDL, calculated: 10.8 MG/DL

## 2014-02-21 LAB — METABOLIC PANEL, COMPREHENSIVE
A-G Ratio: 1.3 (ref 0.8–1.7)
ALT (SGPT): 23 U/L (ref 13–56)
AST (SGOT): 14 U/L — ABNORMAL LOW (ref 15–37)
Albumin: 3.8 g/dL (ref 3.4–5.0)
Alk. phosphatase: 103 U/L (ref 45–117)
Anion gap: 8 mmol/L (ref 3.0–18)
BUN/Creatinine ratio: 29 — ABNORMAL HIGH (ref 12–20)
BUN: 16 MG/DL (ref 7.0–18)
Bilirubin, total: 0.3 MG/DL (ref 0.2–1.0)
CO2: 30 mmol/L (ref 21–32)
Calcium: 8.5 MG/DL (ref 8.5–10.1)
Chloride: 104 mmol/L (ref 100–108)
Creatinine: 0.55 MG/DL — ABNORMAL LOW (ref 0.6–1.3)
GFR est AA: >60 mL/min/{1.73_m2} (ref >60)
GFR est non-AA: >60 mL/min/{1.73_m2} (ref >60)
Globulin: 2.9 g/dL (ref 2.0–4.0)
Glucose: 78 mg/dL (ref 74–99)
Potassium: 4.4 mmol/L (ref 3.5–5.5)
Protein, total: 6.7 g/dL (ref 6.4–8.2)
Sodium: 142 mmol/L (ref 136–145)

## 2014-02-27 ENCOUNTER — Ambulatory Visit: Admit: 2014-02-27 | Discharge: 2014-02-27 | Payer: MEDICARE | Attending: Internal Medicine | Primary: Family Medicine

## 2014-02-27 DIAGNOSIS — E7800 Pure hypercholesterolemia, unspecified: Secondary | ICD-10-CM

## 2014-02-27 NOTE — Progress Notes (Signed)
Subjective:       Chief Complaint  The patient presents for follow up of hypertension and high cholesterol. Cardiomyopathy         HPI  Cathy [name] is a 64 y.o. female seen for follow up of hyperlipidemia.  Shealso has hypertension. Hypertension well controlled, no significant medication side effects noted, on Coreg and lisinopril, her SBP goes below 90 at times and she has been told by cardiology to hold BP meds at that time, hyperlipidemia reasonably well controlled, no significant medication side effects noted, on Crestor. Pt has had difficulty tolerating other statins in the past    Diet and Lifestyle: generally follows a low fat low cholesterol diet    Home BP Monitoring: is not measured at home.    Other symptoms and concerns: Cardiovascular Review:  The patient has CHF/Cardiomyopathy   Diet and Lifestyle: generally follows a low fat low cholesterol diet, exercises sporadically she will try to do it more regularly this year  Home BP Monitoring: is not measured at home.  Pertinent ROS: taking medications as instructed, no medication side effects noted, no TIA's, no chest pain on exertion, no dyspnea on exertion, no swelling of ankles.     Vit D deficiency is mild. Pt currently not on any supplementation.      Discussed the patient's BMI with her.  The BMI follow up plan is as follows: BMI is out of normal parameters and plan is as follows: I have counseled this patient on diet and exercise regimens.      Current Outpatient Prescriptions   Medication Sig   ??? lisinopril (PRINIVIL, ZESTRIL) 5 mg tablet TAKE ONE TABLET BY MOUTH DAILY   ??? furosemide (LASIX) 20 mg tablet TAKE ONE TABLET BY MOUTH DAILY   ??? carvedilol (COREG) 3.125 mg tablet TAKE ONE TABLET BY MOUTH TWICE A DAY   ??? rosuvastatin (CRESTOR) 5 mg tablet TAKE ONE TABLET BY MOUTH IN THE EVENING     No current facility-administered medications for this visit.             Review of Systems  Respiratory: negative for dyspnea on exertion   Cardiovascular: negative for chest pain    Objective:     BP 118/62 mmHg   Pulse 81   Temp(Src) 97.7 ??F (36.5 ??C) (Oral)   Resp 18   Ht 5' 10"  (1.778 m)   Wt 194 lb (87.998 kg)   BMI 27.84 kg/m2   SpO2 95%     General appearance - alert, well appearing, and in no distress  Neck - supple, no significant adenopathy  Chest - clear to auscultation, no wheezes, rales or rhonchi, symmetric air entry  Heart - normal rate, regular rhythm, normal S1, S2, no murmurs, rubs, clicks or gallops  Extremities - peripheral pulses normal, no pedal edema, no clubbing or cyanosis  Skin - normal coloration and turgor, no rashes, no suspicious skin lesions noted      Labs:   Lab Results   Component Value Date/Time    CHOLESTEROL, TOTAL 158 02/20/2014 03:00 PM    HDL CHOLESTEROL 68 02/20/2014 03:00 PM    LDL, CALCULATED 79.2 02/20/2014 03:00 PM    TRIGLYCERIDE 54 02/20/2014 03:00 PM    CHOL/HDL RATIO 2.3 02/20/2014 03:00 PM     Lab Results   Component Value Date/Time    ALT 23 02/20/2014 03:00 PM    AST 14 02/20/2014 03:00 PM    ALK. PHOSPHATASE 103 02/20/2014 03:00 PM  BILIRUBIN, DIRECT 0.1 06/07/2008 10:46 AM    BILIRUBIN, TOTAL 0.3 02/20/2014 03:00 PM     Lab Results   Component Value Date/Time    GFR EST AA >60 02/20/2014 03:00 PM    GFR EST NON-AA >60 02/20/2014 03:00 PM    CREATININE (POC) 0.9 07/21/2012 08:08 AM    CREATININE 0.55 02/20/2014 03:00 PM    BUN 16 02/20/2014 03:00 PM    SODIUM 142 02/20/2014 03:00 PM    POTASSIUM 4.4 02/20/2014 03:00 PM    CHLORIDE 104 02/20/2014 03:00 PM    CO2 30 02/20/2014 03:00 PM            Assessment / Plan     Hypertension well controlled, on Coreg   Hyperlipidemia reasonably well controlled, on Low dose Crestor, will not increase it at this time due to concerns about myalgias      ICD-10-CM ICD-9-CM    1. High cholesterol E78.0 272.0 LIPID PANEL   2. Essential hypertension R60 454.0 METABOLIC PANEL, COMPREHENSIVE   3. Cardiomyopathy ([place]) I42.9 425.4 Stable on current meds. Pt follows up  with Cardiology.                Low cholesterol diet, weight control and daily exercise discussed.     Follow-up Disposition:  Return in about 6 months (around 08/28/2014) for labs 1 week before.      Reviewed plan of care. Patient has provided input and agrees with goals.

## 2014-02-27 NOTE — Patient Instructions (Signed)
DASH Diet: Care Instructions  Your Care Instructions  The DASH diet is an eating plan that can help lower your blood pressure. DASH stands for Dietary Approaches to Stop Hypertension. Hypertension is high blood pressure.  The DASH diet focuses on eating foods that are high in calcium, potassium, and magnesium. These nutrients can lower blood pressure. The foods that are highest in these nutrients are fruits, vegetables, low-fat dairy products, nuts, seeds, and legumes. But taking calcium, potassium, and magnesium supplements instead of eating foods that are high in those nutrients does not have the same effect. The DASH diet also includes whole grains, fish, and poultry.  The DASH diet is one of several lifestyle changes your doctor may recommend to lower your high blood pressure. Your doctor may also want you to decrease the amount of sodium in your diet. Lowering sodium while following the DASH diet can lower blood pressure even further than just the DASH diet alone.  Follow-up care is a key part of your treatment and safety. Be sure to make and go to all appointments, and call your doctor if you are having problems. It's also a good idea to know your test results and keep a list of the medicines you take.  How can you care for yourself at home?  Following the DASH diet  ?? Eat 4 to 5 servings of fruit each day. A serving is 1 medium-sized piece of fruit, ?? cup chopped or canned fruit, 1/4 cup dried fruit, or 4 ounces (?? cup) of fruit juice. Choose fruit more often than fruit juice.  ?? Eat 4 to 5 servings of vegetables each day. A serving is 1 cup of lettuce or raw leafy vegetables, ?? cup of chopped or cooked vegetables, or 4 ounces (?? cup) of vegetable juice. Choose vegetables more often than vegetable juice.  ?? Get 2 to 3 servings of low-fat and fat-free dairy each day. A serving is 8 ounces of milk, 1 cup of yogurt, or 1 ?? ounces of cheese.   ?? Eat 6 to 8 servings of grains each day. A serving is 1 slice of bread, 1 ounce of dry cereal, or ?? cup of cooked rice, pasta, or cooked cereal. Try to choose whole-grain products as much as possible.  ?? Limit lean meat, poultry, and fish to 2 servings each day. A serving is 3 ounces, about the size of a deck of cards.  ?? Eat 4 to 5 servings of nuts, seeds, and legumes (cooked dried beans, lentils, and split peas) each week. A serving is 1/3 cup of nuts, 2 tablespoons of seeds, or ?? cup of cooked beans or peas.  ?? Limit fats and oils to 2 to 3 servings each day. A serving is 1 teaspoon of vegetable oil or 2 tablespoons of salad dressing.  ?? Limit sweets and added sugars to 5 servings or less a week. A serving is 1 tablespoon jelly or jam, ?? cup sorbet, or 1 cup of lemonade.  ?? Eat less than 2,300 milligrams (mg) of sodium a day. If you have high blood pressure, diabetes, or chronic kidney disease, if you are African-American, or if you are older than age 50, try to limit the amount of sodium you eat to less than 1,500 mg a day.  Tips for success  ?? Start small. Do not try to make dramatic changes to your diet all at once. You might feel that you are missing out on your favorite foods and then be more likely   to not follow the plan. Make small changes, and stick with them. Once those changes become habit, add a few more changes.  ?? Try some of the following:  ?? Make it a goal to eat a fruit or vegetable at every meal and at snacks. This will make it easy to get the recommended amount of fruits and vegetables each day.  ?? Try yogurt topped with fruit and nuts for a snack or healthy dessert.  ?? Add lettuce, tomato, cucumber, and onion to sandwiches.  ?? Combine a ready-made pizza crust with low-fat mozzarella cheese and lots of vegetable toppings. Try using tomatoes, squash, spinach, broccoli, carrots, cauliflower, and onions.  ?? Have a variety of cut-up vegetables with a low-fat dip as an appetizer  instead of chips and dip.  ?? Sprinkle sunflower seeds or chopped almonds over salads. Or try adding chopped walnuts or almonds to cooked vegetables.  ?? Try some vegetarian meals using beans and peas. Add garbanzo or kidney beans to salads. Make burritos and tacos with mashed pinto beans or black beans.   Where can you learn more?   Go to http://www.healthwise.net/[facility]  Enter H967 in the search box to learn more about "DASH Diet: Care Instructions."   ?? 2006-2015 Health[employer], [employer]. Care instructions adapted under license by [facility] (which disclaims liability or warranty for this information). This care instruction is for use with your licensed healthcare professional. If you have questions about a medical condition or this instruction, always ask your healthcare professional. Healthwise, Incorporated disclaims any warranty or liability for your use of this information.  Content Version: 10.7.482551; Current as of: April 15, 2013

## 2014-02-27 NOTE — Progress Notes (Signed)
Verbal order read back per Dr. Madelyn Brunner flu vaccine.  Patient received flu vaccine in left deltoid.  Patient tolerated well and left without complaints.  Patient received flu VIS sheet.

## 2014-02-27 NOTE — Progress Notes (Signed)
Patient is in the office today for 6 month follow up.    Do you have an Advance Directive no  Do you want more information no    1. Have you been to the ER, urgent care clinic since your last visit?  Hospitalized since your last visit?No    2. Have you seen or consulted any other health care providers outside of the [facility] since your last visit?  Include any pap smears or colon screening. No

## 2014-02-28 NOTE — Addendum Note (Signed)
Addended by: Selena Lesser on: 02/28/2014 02:04 PM      Modules accepted: Level of Service

## 2014-04-20 ENCOUNTER — Ambulatory Visit: Admit: 2014-04-20 | Discharge: 2014-04-20 | Payer: MEDICARE | Attending: Internal Medicine | Primary: Family Medicine

## 2014-04-20 DIAGNOSIS — J020 Streptococcal pharyngitis: Secondary | ICD-10-CM

## 2014-04-20 LAB — AMB POC RAPID STREP A: Group A Strep Ag: POSITIVE

## 2014-04-20 MED ORDER — AZITHROMYCIN 250 MG TAB
250 mg | ORAL_TABLET | ORAL | Status: AC
Start: 2014-04-20 — End: 2014-04-25

## 2014-04-20 MED ORDER — HYDROCODONE 10 MG-CHLORPHENIRAMINE 8 MG/5 ML ORAL SUSP EXTEND.REL 12HR
10-8 mg/5 mL | Freq: Two times a day (BID) | ORAL | Status: DC | PRN
Start: 2014-04-20 — End: 2014-07-26

## 2014-04-20 NOTE — Patient Instructions (Signed)
Sore Throat: Care Instructions  Your Care Instructions     Infection by bacteria or a virus causes most sore throats. Cigarette smoke, dry air, air pollution, allergies, and yelling can also cause a sore throat. Sore throats can be painful and annoying. Fortunately, most sore throats go away on their own. If you have a bacterial infection, your doctor may prescribe antibiotics.  Follow-up care is a key part of your treatment and safety. Be sure to make and go to all appointments, and call your doctor if you are having problems. It's also a good idea to know your test results and keep a list of the medicines you take.  How can you care for yourself at home?  ?? If your doctor prescribed antibiotics, take them as directed. Do not stop taking them just because you feel better. You need to take the full course of antibiotics.  ?? Gargle with warm salt water once an hour to help reduce swelling and relieve discomfort. Use 1 teaspoon of salt mixed in 1 cup of warm water.  ?? Take an over-the-counter pain medicine, such as acetaminophen (Tylenol), ibuprofen (Advil, Motrin), or naproxen (Aleve). Read and follow all instructions on the label.  ?? Be careful when taking over-the-counter cold or flu medicines and Tylenol at the same time. Many of these medicines have acetaminophen, which is Tylenol. Read the labels to make sure that you are not taking more than the recommended dose. Too much acetaminophen (Tylenol) can be harmful.  ?? Drink plenty of fluids. Fluids may help soothe an irritated throat. Hot fluids, such as tea or soup, may help decrease throat pain.  ?? Use over-the-counter throat lozenges to soothe pain. Regular cough drops or hard candy may also help. These should not be given to young children because of the risk of choking.  ?? Do not smoke or allow others to smoke around you. If you need help quitting, talk to your doctor about stop-smoking programs and medicines.  These can increase your chances of quitting for good.  ?? Use a vaporizer or humidifier to add moisture to your bedroom. Follow the directions for cleaning the machine.  When should you call for help?  Call your doctor now or seek immediate medical care if:  ?? You have new or worse trouble swallowing.  ?? Your sore throat gets much worse on one side.  Watch closely for changes in your health, and be sure to contact your doctor if you do not get better as expected.   Where can you learn more?   Go to http://www.healthwise.net/[facility]  Enter U420 in the search box to learn more about "Sore Throat: Care Instructions."   ?? 2006-2015 Healthwise, Inc[employer]. Care instructions adapted under license by [facility] (which disclaims liability or warranty for this information). This care instruction is for use with your licensed healthcare professional. If you have questions about a medical condition or this instruction, always ask your healthcare professional. Healthwise, Incorporated disclaims any warranty or liability for your use of this information.  Content Version: 10.7.482551; Current as of: January 07, 2013

## 2014-04-20 NOTE — Progress Notes (Signed)
HISTORY OF PRESENT ILLNESS  Cathy [name] is a 64 y.o. female.  Cold Symptoms  The history is provided by the patient. This is a new problem. The current episode started more than 1 week ago. The problem has not changed since onset.The cough is productive of sputum. There has been no fever. Associated symptoms include sore throat and wheezing. Pertinent negatives include no headaches. She has tried nothing for the symptoms. The treatment provided mild relief. She is not a smoker.       Review of Systems   HENT: Positive for sore throat.    Respiratory: Positive for wheezing.    Neurological: Negative for headaches.       Physical Exam   Constitutional: She appears well-developed and well-nourished.   HENT:   Right Ear: Tympanic membrane and ear canal normal.   Left Ear: Tympanic membrane and ear canal normal.   Nose: Mucosal edema present.   Mouth/Throat: Uvula is midline, oropharynx is clear and moist and mucous membranes are normal.   Cardiovascular: Normal rate and normal heart sounds.    Pulmonary/Chest: Effort normal and breath sounds normal.   Vitals reviewed.      ASSESSMENT and PLAN    ICD-10-CM ICD-9-CM    1. Strep pharyngitis J02.0 034.0 azithromycin (ZITHROMAX) 250 mg tablet   2. Cough R05 786.2 chlorpheniramine-HYDROcodone (TUSSIONEX) 10-8 mg/5 mL suspension   3. Sore throat J02.9 462 AMB POC RAPID STREP A positive      Reviewed plan of care. Patient has provided input and agrees with goals.

## 2014-04-20 NOTE — Progress Notes (Signed)
Patient is in the office today for a cough and sore throat 5/10.  Patient states she was coughing up greenish sputum a few days ago.    Do you have an Advance Directive no  Do you want more information no    1. Have you been to the ER, urgent care clinic since your last visit?  Hospitalized since your last visit?No    2. Have you seen or consulted any other health care providers outside of the [place] since your last visit?  Include any pap smears or colon screening. No

## 2014-04-25 ENCOUNTER — Ambulatory Visit: Admit: 2014-04-25 | Discharge: 2014-04-25 | Payer: MEDICARE | Attending: Specialist | Primary: Family Medicine

## 2014-04-25 DIAGNOSIS — I5022 Chronic systolic (congestive) heart failure: Secondary | ICD-10-CM

## 2014-04-25 NOTE — Progress Notes (Signed)
1. Have you been to the ER, urgent care clinic since your last visit?  Hospitalized since your last visit?     No    2. Have you seen or consulted any other health care providers outside of the [facility] since your last visit?  Include any pap smears or colon screening.      Yes Where: pcp

## 2014-04-25 NOTE — Progress Notes (Signed)
HISTORY OF PRESENT ILLNESS  Cathy [name] is a 64 y.o. female.    CHF  The history is provided by the patient. This is a chronic problem. The problem occurs constantly. The problem has not changed since onset.Associated symptoms include shortness of breath. Pertinent negatives include no chest pain. The symptoms are aggravated by exertion.   Shortness of Breath  The history is provided by the patient. This is a recurrent problem. The problem occurs frequently.The problem has not changed since onset.Pertinent negatives include no fever, no cough, no sputum production, no hemoptysis, no wheezing, no PND, no orthopnea, no chest pain, no vomiting, no rash, no leg swelling and no claudication.       Review of Systems   Constitutional: Negative for fever and chills.   HENT: Negative for nosebleeds.    Eyes: Negative for blurred vision and double vision.   Respiratory: Positive for shortness of breath. Negative for cough, hemoptysis, sputum production and wheezing.    Cardiovascular: Negative for chest pain, palpitations, orthopnea, claudication, leg swelling and PND.   Gastrointestinal: Negative for heartburn, nausea and vomiting.   Musculoskeletal: Negative for myalgias.   Skin: Negative for rash.   Neurological: Negative for dizziness and weakness.   Endo/Heme/Allergies: Does not bruise/bleed easily.     Family History   Problem Relation Age of Onset   ??? Diabetes Mother    ??? Heart Attack Father      MI   ??? Heart Disease Maternal Grandmother    ??? Heart Disease Paternal Grandmother    ??? Kidney Disease Other      1 sib deceased   ??? Cancer Other      1 sib throat cancer   ??? Diabetes Other    ??? Diabetes Daughter    ??? Other Daughter      leukemia   ??? Hypertension Other        Past Medical History   Diagnosis Date   ??? CHF (congestive heart failure) ([place]) 06/23/2008     Non-ischemic CMP , Cath (2008) Normal coronary arteries   ??? 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 Fibroid 06/23/2008   ??? HTN (hypertension) 06/23/2008   ??? HLD (hyperlipidemia)    ??? Breast mass, left      benign   ??? OSA (obstructive sleep apnea) 9/10   ??? Chronic systolic heart failure (HCC)      Stable,    ??? Automatic implantable cardiac defibrillator in situ      Post ddd icd Set up carelink   ??? Hypotension, unspecified      Related to lisinopril   ??? Venous reflux    ??? Left knee pain      With swelling   ??? Wears glasses    ??? Leg pain    ??? Degenerative arthritis of left knee    ??? 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 Hx tonsil and adenoidectomy     ??? Hx tubal ligation     ??? Hx total abdominal hysterectomy  1996   ??? Hx cholecystectomy     ??? Hx pacemaker       dual chamber icd   ??? Hx craniotomy  2013     meningioma    ??? Hx breast biopsy       rt  breast, benign   ??? Hx heent       glasses       Allergies   Allergen Reactions   ??? Tramadol Other (comments)     Psychotic Hallucinations   ??? Pcn [Penicillins] Itching     Itching bumps on hands and arms       Current Outpatient Prescriptions   Medication Sig   ??? azithromycin (ZITHROMAX) 250 mg tablet Take 2 tablets today, then take 1 tablet daily   ??? chlorpheniramine-HYDROcodone (TUSSIONEX) 10-8 mg/5 mL suspension Take 5 mL by mouth every twelve (12) hours as needed for Cough. Max Daily Amount: 10 mL.   ??? lisinopril (PRINIVIL, ZESTRIL) 5 mg tablet TAKE ONE TABLET BY MOUTH DAILY   ??? furosemide (LASIX) 20 mg tablet TAKE ONE TABLET BY MOUTH DAILY   ??? carvedilol (COREG) 3.125 mg tablet TAKE ONE TABLET BY MOUTH TWICE A DAY   ??? rosuvastatin (CRESTOR) 5 mg tablet TAKE ONE TABLET BY MOUTH IN THE EVENING     No current facility-administered medications for this visit.       BP 133/58 mmHg   Pulse 85   Ht 5\' 10"  (1.778 m)   Wt 88.905 kg (196 lb)   BMI 28.12 kg/m2      Physical Exam   Constitutional: She is oriented to person, place, and time. She appears well-developed and well-nourished.   HENT:    Head: Normocephalic and atraumatic.   Eyes: Conjunctivae are normal.   Neck: Neck supple. No JVD present. No tracheal deviation present. No thyromegaly present.   Cardiovascular: Normal rate and regular rhythm.  PMI is not displaced.  Exam reveals no gallop, no S3 and no decreased pulses.    Murmur heard.   Holosystolic murmur is present with a grade of 2/6  at the lower left sternal border and apex  Pulmonary/Chest: No respiratory distress. She has no wheezes. She has no rales. She exhibits no tenderness.   Abdominal: Soft. There is no tenderness.   Musculoskeletal: She exhibits no edema.   Neurological: She is alert and oriented to person, place, and time.   Skin: Skin is warm.   Psychiatric: She has a normal mood and affect.     CARDIOLOGY STUDIES 08/25/2010 10/25/2008 07/25/2008 01/25/2008 02/24/2006   Myocardial Perfusion Scan Result - - FIXED INF AND REVERSIBLE ANT APICAL DEFECT - -   Cardiac Cath Result - - - - EF 25%, NO SIGNIFICANT CAD   Echocardiogram - Complete Result DILATED 20%, MILD MR - - EF 25% -   Doppler US Arterial Result - NL SCAN - - -           [name] has a reminder for a "due or due soon" health maintenance. I have asked that she contact her primary care provider for follow-up on this health maintenance.    SUMMARY:echo:01/2013  Left ventricle: The ventricle was moderately to severely dilated. Systolic  function was moderately to markedly reduced. Ejection fraction was  estimated in the range of 20 % to 25 %. There was severe diffuse  hypokinesis. Features were consistent with a pseudonormal left ventricular  filling pattern, with concomitant abnormal relaxation and increased  filling pressure (grade 2 diastolic dysfunction). Intracavitary echoes  most suggestive of apical trabeculations were present.    Left atrium: The atrium was severely dilated. LA volume index was 91  ml/m squared.    Right atrium: The atrium was mildly dilated.     Mitral valve: There was mild annular  calcification. There was mild diffuse  thickening of the anterior and posterior leaflets. In certain images the  leaflet tips exhibit minimally reduced coaptation. There was moderate to  severe regurgitation. Mean transmitral gradient was 2.7 mmHg. The  effective orifice of mitral regurgitation by proximal isovelocity surface  area was 0.22 cm squared. The volume of mitral regurgitation by proximal  isovelocity surface area was 39 ml.    Tricuspid valve: There was mild regurgitation. Pulmonary artery systolic  pressure: 56 mmHg.     Assessment      ICD-10-CM ICD-9-CM    1. Chronic systolic heart failure (HCC) I50.22 428.22     stable  nyha class3   2. Cardiomyopathy ([place]) I42.9 425.4     severe   3. Essential hypertension I10 401.9     stable   4. AICD (automatic cardioverter/defibrillator) present Z95.810 V45.02     normal function   5. Non-rheumatic mitral regurgitation I34.0 424.0     mod to severe     On low dose coreg and lisinopril as Patient had hypotension in past with higher dose  There are no discontinued medications.    No orders of the defined types were placed in this encounter.       Follow-up Disposition:  Return in about 6 months (around 10/26/2014).

## 2014-05-17 ENCOUNTER — Encounter: Admit: 2014-05-17 | Discharge: 2014-05-22 | Payer: MEDICARE | Primary: Family Medicine

## 2014-07-23 NOTE — Telephone Encounter (Signed)
Pt with wheezing and sneezing. Her chest hurts when she sneezes. Pt says she took some lasix and if she does not feel better will go to ER in next few hours. Pt encouraged to go to ER if symptoms do not improve or worsen.

## 2014-07-24 ENCOUNTER — Emergency Department: Admit: 2014-07-24 | Payer: MEDICARE | Primary: Family Medicine

## 2014-07-24 ENCOUNTER — Inpatient Hospital Stay
Admit: 2014-07-24 | Discharge: 2014-07-24 | Disposition: A | Discharge status: Home / Self Care | Payer: MEDICARE | Attending: Emergency Medicine

## 2014-07-24 DIAGNOSIS — J209 Acute bronchitis, unspecified: Secondary | ICD-10-CM

## 2014-07-24 LAB — CARDIAC PANEL,(CK, CKMB & TROPONIN)
CK - MB: 0.7 ng/ml (ref 0.5–3.6)
CK-MB Index: 1.9 % (ref 0.0–4.0)
CK: 37 U/L (ref 26–192)
Troponin-I, QT: <0.02 NG/ML (ref 0.0–0.045)

## 2014-07-24 LAB — CBC WITH AUTOMATED DIFF
ABS. BASOPHILS: 0 10*3/uL (ref 0.0–0.1)
ABS. EOSINOPHILS: 0 10*3/uL (ref 0.0–0.4)
ABS. LYMPHOCYTES: 1 10*3/uL (ref 0.9–3.6)
ABS. MONOCYTES: 0.4 10*3/uL (ref 0.05–1.2)
ABS. NEUTROPHILS: 2.2 10*3/uL (ref 1.8–8.0)
BASOPHILS: 1 % (ref 0–2)
EOSINOPHILS: 0 % (ref 0–5)
HCT: 34.3 % — ABNORMAL LOW (ref 35.0–45.0)
HGB: 11.4 g/dL — ABNORMAL LOW (ref 12.0–16.0)
LYMPHOCYTES: 28 % (ref 21–52)
MCH: 30.8 PG (ref 24.0–34.0)
MCHC: 33.2 g/dL (ref 31.0–37.0)
MCV: 92.7 FL (ref 74.0–97.0)
MONOCYTES: 11 % — ABNORMAL HIGH (ref 3–10)
MPV: 11.7 FL (ref 9.2–11.8)
NEUTROPHILS: 60 % (ref 40–73)
PLATELET: 181 10*3/uL (ref 135–420)
RBC: 3.7 M/uL — ABNORMAL LOW (ref 4.20–5.30)
RDW: 13.7 % (ref 11.6–14.5)
WBC: 3.6 10*3/uL — ABNORMAL LOW (ref 4.6–13.2)

## 2014-07-24 LAB — METABOLIC PANEL, BASIC
Anion gap: 8 mmol/L (ref 3.0–18)
BUN/Creatinine ratio: 24 — ABNORMAL HIGH (ref 12–20)
BUN: 15 MG/DL (ref 7.0–18)
CO2: 28 mmol/L (ref 21–32)
Calcium: 8.5 MG/DL (ref 8.5–10.1)
Chloride: 106 mmol/L (ref 100–108)
Creatinine: 0.63 MG/DL (ref 0.6–1.3)
GFR est AA: >60 mL/min/{1.73_m2} (ref >60)
GFR est non-AA: >60 mL/min/{1.73_m2} (ref >60)
Glucose: 96 mg/dL (ref 74–99)
Potassium: 3.8 mmol/L (ref 3.5–5.5)
Sodium: 142 mmol/L (ref 136–145)

## 2014-07-24 LAB — EKG, 12 LEAD, INITIAL
Atrial Rate: 87 {beats}/min
Calculated P Axis: 55 degrees
Calculated R Axis: -12 degrees
Calculated T Axis: 130 degrees
P-R Interval: 182 ms
Q-T Interval: 404 ms
QRS Duration: 106 ms
QTC Calculation (Bezet): 486 ms
Ventricular Rate: 87 {beats}/min

## 2014-07-24 LAB — URINALYSIS W/ RFLX MICROSCOPIC
Bilirubin: NEGATIVE
Glucose: NEGATIVE mg/dL
Ketone: NEGATIVE mg/dL
Nitrites: NEGATIVE
Protein: NEGATIVE mg/dL
Specific gravity: 1.015 (ref 1.005–1.030)
Urobilinogen: 0.2 EU/dL (ref 0.2–1.0)
pH (UA): 7 (ref 5.0–8.0)

## 2014-07-24 LAB — URINE MICROSCOPIC ONLY
RBC: 3 /hpf (ref 0–5)
WBC: 2 /hpf (ref 0–4)

## 2014-07-24 LAB — MAGNESIUM: Magnesium: 2 mg/dL (ref 1.8–2.4)

## 2014-07-24 LAB — NT-PRO BNP: NT pro-BNP: 1730 PG/ML — ABNORMAL HIGH (ref 0–900)

## 2014-07-24 MED ORDER — ACETAMINOPHEN 325 MG TABLET
325 mg | ORAL | Status: AC
Start: 2014-07-24 — End: 2014-07-24
  Administered 2014-07-24: 13:00:00 via ORAL

## 2014-07-24 MED ORDER — IPRATROPIUM-ALBUTEROL 2.5 MG-0.5 MG/3 ML NEB SOLUTION
2.5 mg-0.5 mg/3 ml | Freq: Once | RESPIRATORY_TRACT | Status: DC
Start: 2014-07-24 — End: 2014-07-24

## 2014-07-24 MED ORDER — LEVOFLOXACIN 750 MG TAB
750 mg | ORAL | Status: AC
Start: 2014-07-24 — End: 2014-07-24
  Administered 2014-07-24: 13:00:00 via ORAL

## 2014-07-24 MED ORDER — FUROSEMIDE 10 MG/ML IJ SOLN
10 mg/mL | INTRAMUSCULAR | Status: DC
Start: 2014-07-24 — End: 2014-07-24

## 2014-07-24 MED ORDER — INHALATIONAL SPACING DEVICE
Status: DC
Start: 2014-07-24 — End: 2015-05-06

## 2014-07-24 MED ORDER — ALBUTEROL SULFATE HFA 90 MCG/ACTUATION AEROSOL INHALER
90 mcg/actuation | RESPIRATORY_TRACT | Status: DC | PRN
Start: 2014-07-24 — End: 2015-05-06

## 2014-07-24 MED ORDER — PREDNISONE 20 MG TAB
20 mg | ORAL_TABLET | Freq: Every day | ORAL | Status: AC
Start: 2014-07-24 — End: 2014-07-29

## 2014-07-24 MED ORDER — METHYLPREDNISOLONE (PF) 125 MG/2 ML IJ SOLR
125 mg/2 mL | INTRAMUSCULAR | Status: AC
Start: 2014-07-24 — End: 2014-07-24
  Administered 2014-07-24: 13:00:00 via INTRAVENOUS

## 2014-07-24 MED ORDER — ONDANSETRON (PF) 4 MG/2 ML INJECTION
4 mg/2 mL | INTRAMUSCULAR | Status: AC
Start: 2014-07-24 — End: 2014-07-24
  Administered 2014-07-24: 13:00:00 via INTRAVENOUS

## 2014-07-24 MED ORDER — LEVOFLOXACIN 750 MG TAB
750 mg | ORAL_TABLET | Freq: Every day | ORAL | Status: AC
Start: 2014-07-24 — End: 2014-07-28

## 2014-07-24 MED ORDER — HYDROMORPHONE (PF) 1 MG/ML IJ SOLN
1 mg/mL | INTRAMUSCULAR | Status: AC
Start: 2014-07-24 — End: 2014-07-24
  Administered 2014-07-24: 13:00:00 via INTRAVENOUS

## 2014-07-24 MED FILL — SOLU-MEDROL (PF) 125 MG/2 ML SOLUTION FOR INJECTION: 125 mg/2 mL | INTRAMUSCULAR | Qty: 2

## 2014-07-24 MED FILL — LEVOFLOXACIN 750 MG TAB: 750 mg | ORAL | Qty: 1

## 2014-07-24 MED FILL — HYDROMORPHONE (PF) 1 MG/ML IJ SOLN: 1 mg/mL | INTRAMUSCULAR | Qty: 1

## 2014-07-24 MED FILL — ONDANSETRON (PF) 4 MG/2 ML INJECTION: 4 mg/2 mL | INTRAMUSCULAR | Qty: 2

## 2014-07-24 MED FILL — TYLENOL 325 MG TABLET: 325 mg | ORAL | Qty: 3

## 2014-07-24 NOTE — ED Notes (Addendum)
Received bedside report from [place], [place]. Patient resting on bedside monitor at this time. Plan of care discussed and questions encouraged. Will continue to monitor.

## 2014-07-24 NOTE — ED Notes (Signed)
Received bedside report from [place], RN, assumed care of the patient at this time. Patient alert and orientated, back on monitor after ambulating to restroom. Call bell within reach, bed at lowest level.

## 2014-07-24 NOTE — ED Notes (Signed)
Provider bedside at this time to discuss plan for discharge with patient.

## 2014-07-24 NOTE — ED Notes (Signed)
Pt hourly rounding competed.  Safety   Pt (*) resting on stretcher with side rails up and call bell in reach. Family at bedside, bed at lowest level.    () in chair    () in parents arms.  Toileting   Pt offered ()Bedpan     ()Assistance to Restroom     ()Urinal  Ongoing Updates  Updated on plan of care and status of test results.  Pain Management  Inquired as to comfort and offered comfort measures:    () warm blankets   () dimmed lights

## 2014-07-24 NOTE — ED Notes (Signed)
Patient came to ER for generalized body aches, cough and chest pain that is from her non productivecoughing, patient has tried nothing for her cough.

## 2014-07-24 NOTE — ED Provider Notes (Signed)
HPI Comments: Cathy [name] is a 64 yo overweight african [place] female with hx of CHF, HTN, OSA, hyperlipidemia, and allergic rhinitis presents to the ED via POV c/o cough, congestion, sore throat, fatigue and generalized body aches for 3 days.  Pt states she spoke with her primary care provider and was instructed to come to the ED for evaluation. Pt denies any sick contacts. Denies any chest pain, SOB, palpitations, N/V.      PSX:   1.  Tonsil/Adeniodectomy  2.  Pacemaker  3.  Tubal Ligation  4.  Total hysterectomy  5.  Cholecystecomy  6.  Craniotomy  7.  Breast Biopsy    Patient is a 64 y.o. female presenting with general illness and fatigue.   Generalized Body Aches  Pertinent negatives include no chest pain, no abdominal pain, no headaches and no shortness of breath.   Fatigue  Pertinent negatives include no chest pain, no abdominal pain, no headaches and no shortness of breath.        Past Medical History:   Diagnosis Date   ??? CHF (congestive heart failure) ([place]) 06/23/2008     Non-ischemic CMP , Cath (2008) Normal coronary arteries   ??? 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 Fibroid 06/23/2008   ??? HTN (hypertension) 06/23/2008   ??? HLD (hyperlipidemia)    ??? Breast mass, left      benign   ??? OSA (obstructive sleep apnea) 9/10   ??? Chronic systolic heart failure (HCC)      Stable,    ??? Automatic implantable cardiac defibrillator in situ      Post ddd icd Set up carelink   ??? Hypotension, unspecified      Related to lisinopril   ??? Venous reflux    ??? Left knee pain      With swelling   ??? Wears glasses    ??? Leg pain    ??? Degenerative arthritis of left knee    ??? 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 Hx tonsil and adenoidectomy     ??? Hx tubal ligation     ??? Hx total abdominal hysterectomy  1996   ??? Hx cholecystectomy     ??? Hx pacemaker       dual chamber icd   ??? Hx craniotomy  2013      meningioma    ??? Hx breast biopsy       rt breast, benign   ??? Hx heent       glasses         Family History:   Problem Relation Age of Onset   ??? Diabetes Mother    ??? Heart Attack Father      MI   ??? Heart Disease Maternal Grandmother    ??? Heart Disease Paternal Grandmother    ??? Kidney Disease Other      1 sib deceased   ??? Cancer Other      1 sib throat cancer   ??? Diabetes Other    ??? Diabetes Daughter    ??? Other Daughter      leukemia   ??? Hypertension Other        History     Social History   ??? Marital Status: MARRIED     Spouse Name: N/A   ??? Number  of Children: N/A   ??? 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 Smoking status: Never Smoker    ??? Smokeless tobacco: Never Used   ??? Alcohol Use: No   ??? Drug Use: No   ??? Sexual Activity:     Partners: Male     Patent examiner Protection: Condom     Other Topics Concern   ??? Not on file     Social History Narrative           ALLERGIES: Tramadol and Pcn      Review of Systems   Constitutional: Positive for fatigue. Negative for fever and chills.   HENT: Positive for congestion and rhinorrhea. Negative for ear pain, sinus pressure and sore throat.    Eyes: Negative for pain and visual disturbance.   Respiratory: Positive for cough and wheezing. Negative for chest tightness and shortness of breath.    Cardiovascular: Negative for chest pain and palpitations.   Gastrointestinal: Negative for nausea, vomiting, abdominal pain and diarrhea.   Genitourinary: Negative for dysuria, urgency and frequency.   Musculoskeletal: Positive for myalgias and arthralgias. Negative for joint swelling.   Skin: Negative for color change, pallor, rash and wound.   Neurological: Negative for dizziness, weakness, numbness and headaches.   Psychiatric/Behavioral: Negative for behavioral problems. The patient is not nervous/anxious.        Filed Vitals:    07/24/14 0730 07/24/14 0800 07/24/14 0830 07/24/14 0900    BP: 128/63 121/63 129/66 117/58   Pulse:       Temp:       Resp:       Weight:       SpO2: 93% 97%              Physical Exam   Constitutional: She is oriented to person, place, and time. She appears well-developed and well-nourished. No distress.   HENT:   Head: Normocephalic and atraumatic.   Right Ear: Hearing, tympanic membrane, external ear and ear canal normal.   Left Ear: Hearing, tympanic membrane, external ear and ear canal normal.   Nose: Mucosal edema and rhinorrhea present. Right sinus exhibits no maxillary sinus tenderness and no frontal sinus tenderness. Left sinus exhibits no maxillary sinus tenderness and no frontal sinus tenderness.   Mouth/Throat: Uvula is midline and mucous membranes are normal. No uvula swelling. Posterior oropharyngeal erythema present. No oropharyngeal exudate, posterior oropharyngeal edema or tonsillar abscesses.   Sinus tracking w/ minimal erythema. Uvula midline. No PTA.    Eyes: Conjunctivae and EOM are normal. Pupils are equal, round, and reactive to light. Right eye exhibits no discharge. Left eye exhibits no discharge.   Neck: Trachea normal, normal range of motion, full passive range of motion without pain and phonation normal. Neck supple. No tracheal tenderness, no spinous process tenderness and no muscular tenderness present. No rigidity. No tracheal deviation, no edema, no erythema and normal range of motion present. No thyroid mass and no thyromegaly present.   Supple w/o LAD.    Cardiovascular: Normal rate, regular rhythm and normal heart sounds.  Exam reveals no gallop and no friction rub.    No murmur heard.  Pulmonary/Chest: Effort normal. No accessory muscle usage or stridor. No tachypnea. No respiratory distress. She has decreased breath sounds. She has no wheezes. She has no rhonchi. She has no rales.   Symmetric rise/fall of the chest  wall. Speaks in full sentences.      Abdominal: Soft. Bowel sounds are normal. She exhibits no mass. There is  no tenderness. There is no rigidity, no rebound, no guarding, no CVA tenderness, no tenderness at McBurney's point and negative Murphy's sign.   Musculoskeletal:        Right lower leg: Normal. She exhibits no tenderness, no bony tenderness, no swelling and no edema.        Left lower leg: Normal. She exhibits no tenderness, no bony tenderness, no swelling and no edema.   BLE: No swelling/edema. DP/PT 2+    Lymphadenopathy:     She has no cervical adenopathy.   Neurological: She is alert and oriented to person, place, and time. She has normal strength. She is not disoriented. No sensory deficit.   Skin: Skin is warm and dry. No rash noted. She is not diaphoretic.   Psychiatric: She has a normal mood and affect. Her behavior is normal.   Nursing note and vitals reviewed.       MDM  Number of Diagnoses or Management Options  Body aches: new and requires workup  Bronchitis with bronchospasm: new and requires workup  Rhinorrhea: new and requires workup  Urinary tract infection, site unspecified: new and requires workup  Diagnosis management comments: DDX: Asthma Exacerbation, Status Asthmaticus, Allergic Rhinitis, Sinusitis, Pleuritis, Pleurisy, Pneumothorax, Pneumonia, Bronchitis, Pleurodynia, Pericarditis, Pleural Effusion, Atelectasis, Pericardial Effusion, Gastro-esophageal spasm, Esophagitis, Gastritis, Airway Foreign Body.     CXR RESULT: No acute cardiopulmonary disease.    Duo-nebs + Solumedrol 125 mg IV + Levaquin 750 mg PO.     9:30 AM: Feeling improved. Lungs CTAB.     Reviewed workup results, any meds, and discharge instructions OR admission plan with patient and any family present.  Answered all questions. Tasia Catchings, PA  9:30 AM    PLEASE FOLLOW-UP AS DIRECTED WITHOUT FAIL WITHIN THE TIME FRAME RECOMMENDED AS FAILURE TO DO SO COULD RESULT IN WORSENING OF YOUR PHYSICAL CONDITION, DEATH, AND OR PERMANENT DISABILITY.     RETURN TO THE EMERGENCY DEPARTMENT AT IF YOU ARE UNABLE TO FOLLOW-UP AS DIRECTED.      RETURN TO THE EMERGENCY DEPARTMENT AT ONCE IF YOU HAVE SYMPTOMS THAT DO NOT IMPROVE WITH TREATMENT, NEW SYMPTOMS, WORSENING SYMPTOMS, OR ANY OTHER CONCERNS.     THE PATIENT AGREES WITH THE DISCHARGE PLAN AND FOLLOW-UP INSTRUCTIONS. THE PATIENT AGREES TO REVIEW ALL HANDOUTS.          Amount and/or Complexity of Data Reviewed  Clinical lab tests: reviewed and ordered  Tests in the radiology section of CPT??: ordered and reviewed  Tests in the medicine section of CPT??: reviewed and ordered  Discussion of test results with the performing providers: yes  Decide to obtain previous medical records or to obtain history from someone other than the patient: yes  Obtain history from someone other than the patient: yes (Husband)  Review and summarize past medical records: yes  Independent visualization of images, tracings, or specimens: yes    Risk of Complications, Morbidity, and/or Mortality  Presenting problems: moderate  Diagnostic procedures: moderate  Management options: moderate    Patient Progress  Patient progress: stable      Procedures    Diagnosis:   1. Bronchitis with bronchospasm    2. Urinary tract infection, site unspecified    3. Body aches    4. Rhinorrhea          Disposition: HOME    Follow-up Information  Follow up With Details Comments Contact Info    [facility] EMERGENCY DEPT  As needed, If symptoms worsen Anderson Cabarrus, MD In 1 day Schedule appointment to be seen within 1 day.  [place] [id number]  [phone]            Current Discharge Medication List      START taking these medications    Details   levofloxacin (LEVAQUIN) 750 mg tablet Take 1 Tab by mouth daily for 4 days. Begin taking tomorrow Tuesday July 26, 2014 as you received your initial dose while in the ER.  Qty: 4 Tab, Refills: 0      albuterol (PROVENTIL HFA, VENTOLIN HFA, PROAIR HFA) 90 mcg/actuation  inhaler Take 2 Puffs by inhalation every four (4) hours as needed for Wheezing or Shortness of Breath (Cough).  Qty: 1 Inhaler, Refills: 0      inhalational spacing device ALWAYS USE WITH INHALER  Qty: 1 Device, Refills: 0      predniSONE (DELTASONE) 20 mg tablet Take 3 Tabs by mouth daily for 5 days.  Qty: 15 Tab, Refills: 0         CONTINUE these medications which have NOT CHANGED    Details   chlorpheniramine-HYDROcodone (TUSSIONEX) 10-8 mg/5 mL suspension Take 5 mL by mouth every twelve (12) hours as needed for Cough. Max Daily Amount: 10 mL.  Qty: 120 mL, Refills: 0    Associated Diagnoses: Cough      lisinopril (PRINIVIL, ZESTRIL) 5 mg tablet TAKE ONE TABLET BY MOUTH DAILY  Qty: 30 Tab, Refills: 5      furosemide (LASIX) 20 mg tablet TAKE ONE TABLET BY MOUTH DAILY  Qty: 30 Tab, Refills: 5      carvedilol (COREG) 3.125 mg tablet TAKE ONE TABLET BY MOUTH TWICE A DAY  Qty: 60 Tab, Refills: 5      rosuvastatin (CRESTOR) 5 mg tablet TAKE ONE TABLET BY MOUTH IN THE EVENING  Qty: 90 Tab, Refills: 3    Associated Diagnoses: High cholesterol             Recent Results (from the past 24 hour(s))   CBC WITH AUTOMATED DIFF    Collection Time: 07/24/14  6:20 AM   Result Value Ref Range    WBC 3.6 (L) 4.6 - 13.2 K/uL    RBC 3.70 (L) 4.20 - 5.30 M/uL    HGB 11.4 (L) 12.0 - 16.0 g/dL    HCT 34.3 (L) 35.0 - 45.0 %    MCV 92.7 74.0 - 97.0 FL    MCH 30.8 24.0 - 34.0 PG    MCHC 33.2 31.0 - 37.0 g/dL    RDW 13.7 11.6 - 14.5 %    PLATELET 181 135 - 420 K/uL    MPV 11.7 9.2 - 11.8 FL    NEUTROPHILS 60 40 - 73 %    LYMPHOCYTES 28 21 - 52 %    MONOCYTES 11 (H) 3 - 10 %    EOSINOPHILS 0 0 - 5 %    BASOPHILS 1 0 - 2 %    ABS. NEUTROPHILS 2.2 1.8 - 8.0 K/UL    ABS. LYMPHOCYTES 1.0 0.9 - 3.6 K/UL    ABS. MONOCYTES 0.4 0.05 - 1.2 K/UL    ABS. EOSINOPHILS 0.0 0.0 - 0.4 K/UL    ABS. BASOPHILS 0.0 0.0 - 0.1 K/UL    DF AUTOMATED  METABOLIC PANEL, BASIC    Collection Time: 07/24/14  6:20 AM   Result Value Ref Range     Sodium 142 136 - 145 mmol/L    Potassium 3.8 3.5 - 5.5 mmol/L    Chloride 106 100 - 108 mmol/L    CO2 28 21 - 32 mmol/L    Anion gap 8 3.0 - 18 mmol/L    Glucose 96 74 - 99 mg/dL    BUN 15 7.0 - 18 MG/DL    Creatinine 0.63 0.6 - 1.3 MG/DL    BUN/Creatinine ratio 24 (H) 12 - 20      GFR est AA >60 >60 ml/min/1.41m2    GFR est non-AA >60 >60 ml/min/1.50m2    Calcium 8.5 8.5 - 10.1 MG/DL   MAGNESIUM    Collection Time: 07/24/14  6:20 AM   Result Value Ref Range    Magnesium 2.0 1.8 - 2.4 mg/dL   URINALYSIS W/ RFLX MICROSCOPIC    Collection Time: 07/24/14  6:20 AM   Result Value Ref Range    Color YELLOW      Appearance CLEAR      Specific gravity 1.015 1.005 - 1.030      pH (UA) 7.0 5.0 - 8.0      Protein NEGATIVE  NEG mg/dL    Glucose NEGATIVE  NEG mg/dL    Ketone NEGATIVE  NEG mg/dL    Bilirubin NEGATIVE  NEG      Blood MODERATE (A) NEG      Urobilinogen 0.2 0.2 - 1.0 EU/dL    Nitrites NEGATIVE  NEG      Leukocyte Esterase LARGE (A) NEG     CARDIAC PANEL,(CK, CKMB & TROPONIN)    Collection Time: 07/24/14  6:20 AM   Result Value Ref Range    CK 37 26 - 192 U/L    CK - MB 0.7 0.5 - 3.6 ng/ml    CK-MB Index 1.9 0.0 - 4.0 %    Troponin-I, Qt. <0.02 0.0 - 0.045 NG/ML   PRO-BNP    Collection Time: 07/24/14  6:20 AM   Result Value Ref Range    NT pro-BNP 1730 (H) 0 - 900 PG/ML   URINE MICROSCOPIC ONLY    Collection Time: 07/24/14  6:20 AM   Result Value Ref Range    WBC 2 to 3 0 - 4 /hpf    RBC 3 to 4 0 - 5 /hpf    Epithelial cells 2+ 0 - 5 /lpf    Bacteria 1+ (A) NEG /hpf   EKG, 12 LEAD, INITIAL    Collection Time: 07/24/14  7:39 AM   Result Value Ref Range    Ventricular Rate 87 BPM    Atrial Rate 87 BPM    P-R Interval 182 ms    QRS Duration 106 ms    Q-T Interval 404 ms    QTC Calculation (Bezet) 486 ms    Calculated P Axis 55 degrees    Calculated R Axis -12 degrees    Calculated T Axis 130 degrees    Diagnosis       Sinus rhythm with occasional and consecutive premature ventricular complexes   and fusion complexes   Incomplete left bundle branch block  Left ventricular hypertrophy with repolarization abnormality  Abnormal ECG  When compared with ECG of 09-Dec-2012 10:55,  fusion complexes are now present  premature ventricular complexes are now present

## 2014-07-24 NOTE — ED Notes (Signed)
I have reviewed the discharge instructions with the patient. The patient verbalizes understanding.   Signing pad not working. Paper version of discharge instructions signed and placed in scan bin.

## 2014-07-24 NOTE — ED Notes (Signed)
Body aches, fatique,  My chest hurts when I cough,  I hear rattling when I breath

## 2014-07-24 NOTE — ED Notes (Signed)
Pt hourly rounding competed.  Safety   Pt (*) resting on stretcher with side rails up and call bell in reach. Family at bedside.    () in chair    () in parents arms.  Toileting   Pt offered ()Bedpan     ()Assistance to Restroom     ()Urinal  Ongoing Updates  Updated on plan of care and status of test results.  Pain Management  Inquired as to comfort and offered comfort measures:    () warm blankets   () dimmed lights

## 2014-07-24 NOTE — ED Notes (Signed)
Bedside report given to [place], [place]

## 2014-07-25 LAB — CULTURE, URINE
Culture result:: 80000
Culture: 80000

## 2014-07-25 NOTE — Telephone Encounter (Signed)
Patient has an appointment for 07/26/2014 at 1:30 pm.  Patient verbalzies understanding .

## 2014-07-25 NOTE — Telephone Encounter (Signed)
Pt need to be seen for a transitional care appt.  Pt was at [facility] on 07/24/2014 dx bronchitis, UTI, body aches, and rhinorrhea.    When could we get this patient in for an f/u?   the morning is preferable per pt.

## 2014-07-26 ENCOUNTER — Ambulatory Visit: Admit: 2014-07-26 | Discharge: 2014-07-26 | Payer: MEDICARE | Attending: Internal Medicine | Primary: Family Medicine

## 2014-07-26 DIAGNOSIS — M25511 Pain in right shoulder: Secondary | ICD-10-CM

## 2014-07-26 MED ORDER — HYDROCODONE 10 MG-CHLORPHENIRAMINE 8 MG/5 ML ORAL SUSP EXTEND.REL 12HR
10-8 mg/5 mL | Freq: Two times a day (BID) | ORAL | Status: DC | PRN
Start: 2014-07-26 — End: 2015-04-25

## 2014-07-26 NOTE — Progress Notes (Signed)
Cathy [name] is a 64 y.o.  female and presents with ED Follow-up; Shoulder Pain; and Cough      SUBJECTIVE:    Shoulder Pain  Patient complains of right side shoulder pain. The symptoms began problem is longstanding, this episode began 3 weeks ago Course of symptoms since onset has been gradually worsening. Pain is described as overall severity = 7 / 10, location: glenohumeral region, worse with overhead movements and worse at night. Symptoms were incited by no known event.  Patient denies fever. Therapy to date includes none.    Pt for the last 4 days has had wheezing, sneezing and cough productive of some sputum. Pt eventually went to ER 2 days ago and was diagnosed with bronchitis/UTI and placed on Levaquin and Prednisone. She has been on the meds for 2 days and cannot tell if she is getting better yet.     Respiratory ROS: negative for - hemoptysis   Cardiovascular ROS: negative for - chest pain    Current Outpatient Prescriptions   Medication Sig   ??? chlorpheniramine-HYDROcodone (TUSSIONEX) 10-8 mg/5 mL suspension Take 5 mL by mouth every twelve (12) hours as needed for Cough. Max Daily Amount: 10 mL.   ??? levofloxacin (LEVAQUIN) 750 mg tablet Take 1 Tab by mouth daily for 4 days. Begin taking tomorrow Tuesday July 26, 2014 as you received your initial dose while in the ER.   ??? inhalational spacing device ALWAYS USE WITH INHALER   ??? predniSONE (DELTASONE) 20 mg tablet Take 3 Tabs by mouth daily for 5 days.   ??? lisinopril (PRINIVIL, ZESTRIL) 5 mg tablet TAKE ONE TABLET BY MOUTH DAILY   ??? furosemide (LASIX) 20 mg tablet TAKE ONE TABLET BY MOUTH DAILY   ??? carvedilol (COREG) 3.125 mg tablet TAKE ONE TABLET BY MOUTH TWICE A DAY   ??? rosuvastatin (CRESTOR) 5 mg tablet TAKE ONE TABLET BY MOUTH IN THE EVENING   ??? albuterol (PROVENTIL HFA, VENTOLIN HFA, PROAIR HFA) 90 mcg/actuation inhaler Take 2 Puffs by inhalation every four (4) hours as needed for Wheezing or Shortness of Breath (Cough).      No current facility-administered medications for this visit.         OBJECTIVE:  oriented to person, place, and time  BP 110/62 mmHg   Pulse 97   Temp(Src) 97.7 ??F (36.5 ??C) (Oral)   Resp 20   Ht 5\' 10"  (1.778 m)   Wt 203 lb (92.08 kg)   BMI 29.13 kg/m2   SpO2 98%   well developed and well nourished  Mouth - mucous membranes moist, pharynx normal without lesions  Chest - clear to auscultation, no wheezes, rales or rhonchi, symmetric air entry  Heart - normal rate, regular rhythm, normal S1, S2, no murmurs, rubs, clicks or gallops  Musculoskeletal - pain right shoulder with active abduction as well as internal and external rotation           Assessment/Plan      ICD-10-CM ICD-9-CM    1. Acute pain of right shoulder M25.511 719.41 REFERRAL TO ORTHOPEDIC SURGERY      REFERRAL TO PHYSICAL THERAPY   2. Rotator cuff arthropathy, right M12.811 716.81 REFERRAL TO ORTHOPEDIC SURGERY      REFERRAL TO PHYSICAL THERAPY   3. Bronchitis J40 490 Will continue Levaquin and Prednisone    4. Cough R05 786.2 Will add chlorpheniramine-HYDROcodone (TUSSIONEX) 10-8 mg/5 mL suspension     Follow-up Disposition:  Return if symptoms worsen or fail to improve.  Reviewed plan of care. Patient has provided input and agrees with goals.

## 2014-07-26 NOTE — Patient Instructions (Signed)
Shoulder Pain: Care Instructions  Your Care Instructions     You can hurt your shoulder by using it too much during an activity, such as fishing or baseball. It can also happen as part of the everyday wear and tear of getting older. Shoulder injuries can be slow to heal, but your shoulder should get better with time.  Your doctor may recommend a sling to rest your shoulder. If you have injured your shoulder, you may need testing and treatment.  Follow-up care is a key part of your treatment and safety. Be sure to make and go to all appointments, and call your doctor if you are having problems. It's also a good idea to know your test results and keep a list of the medicines you take.  How can you care for yourself at home?  ?? Take pain medicines exactly as directed.  ?? If the doctor gave you a prescription medicine for pain, take it as prescribed.  ?? If you are not taking a prescription pain medicine, ask your doctor if you can take an over-the-counter medicine.  ?? Do not take two or more pain medicines at the same time unless the doctor told you to. Many pain medicines contain acetaminophen, which is Tylenol. Too much acetaminophen (Tylenol) can be harmful.  ?? If your doctor recommends that you wear a sling, use it as directed. Do not take it off before your doctor tells you to.  ?? Put ice or a cold pack on the sore area for 10 to 20 minutes at a time. Put a thin cloth between the ice and your skin.  ?? If there is no swelling, you can put moist heat, a heating pad, or a warm cloth on your shoulder. Some doctors suggest alternating between hot and cold.  ?? Rest your shoulder for a few days. If your doctor recommends it, you can then begin gentle exercise of the shoulder, but do not lift anything heavy.  When should you call for help?  Call 911 anytime you think you may need emergency care. For example, call if:  ?? You have chest pain or pressure. This may occur with:  ?? Sweating.  ?? Shortness of breath.   ?? Nausea or vomiting.  ?? Pain that spreads from the chest to the neck, jaw, or one or both shoulders or arms.  ?? Dizziness or lightheadedness.  ?? A fast or uneven pulse.  After calling 911, chew 1 adult-strength aspirin. Wait for an ambulance. Do not try to drive yourself.  ?? Your arm or hand is cool or pale or changes color.  Call your doctor now or seek immediate medical care if:  ?? You have signs of infection, such as:  ?? Increased pain, swelling, warmth, or redness in your shoulder.  ?? Red streaks leading from a place on your shoulder.  ?? Pus draining from an area of your shoulder.  ?? Swollen lymph nodes in your neck, armpits, or groin.  ?? A fever.  Watch closely for changes in your health, and be sure to contact your doctor if:  ?? You cannot use your shoulder.  ?? Your shoulder does not get better as expected.  Where can you learn more?  Go to http://www.healthwise.net/GoodHelpConnections  Enter H996 in the search box to learn more about "Shoulder Pain: Care Instructions."  ?? 2006-2016 Healthwise, Inc[employer]. Care instructions adapted under license by [employer] (which disclaims liability or warranty for this information). This care instruction is for use with   your licensed healthcare professional. If you have questions about a medical condition or this instruction, always ask your healthcare professional. Healthwise, Incorporated disclaims any warranty or liability for your use of this information.  Content Version: 10.9.538570; Current as of: Jul 15, 2013

## 2014-07-26 NOTE — Progress Notes (Signed)
Patient is in the office today for a ED follow up.    Do you have an Advance Directive no  Do you want more information no    1. Have you been to the ER, urgent care clinic since your last visit?  Hospitalized since your last visit?yes, [facility].    2. Have you seen or consulted any other health care providers outside of the [facility] since your last visit?  Include any pap smears or colon screening. No

## 2014-07-27 NOTE — Telephone Encounter (Signed)
Pt was not sure if she should see the ortho doctor before starting physical therapy or start physical therapy then see the ortho doctor? Please advise

## 2014-07-27 NOTE — Telephone Encounter (Addendum)
Patient is aware she can see either start physical therapy or Ortho.  Patient states she wants to see Ortho before she starts PT. Patient is aware if the following appointment with Ortho.    Dr. Carolynn Serve [phone]  August 10, 2014 at 8:30 am  [address]     Patient is aware to bring ins card, picture ID and List of medications.  Patient verbalizes understanding.

## 2014-08-10 ENCOUNTER — Ambulatory Visit: Admit: 2014-08-10 | Discharge: 2014-08-10 | Payer: MEDICARE | Attending: Orthopaedic Surgery | Primary: Family Medicine

## 2014-08-10 DIAGNOSIS — M25511 Pain in right shoulder: Secondary | ICD-10-CM

## 2014-08-10 MED ORDER — LIDOCAINE HCL 1 % (10 MG/ML) IJ SOLN
10 mg/mL (1 %) | Freq: Once | INTRAMUSCULAR | Status: AC
Start: 2014-08-10 — End: 2014-08-10

## 2014-08-10 MED ORDER — TRIAMCINOLONE ACETONIDE 40 MG/ML SUSP FOR INJECTION
40 mg/mL | Freq: Once | INTRAMUSCULAR | Status: AC
Start: 2014-08-10 — End: 2014-08-10

## 2014-08-10 NOTE — Addendum Note (Signed)
Addended by: Luetta Nutting on: 08/10/2014 11:48 AM      Modules accepted: Level of Service

## 2014-08-10 NOTE — Progress Notes (Signed)
Patient here for right shoulder pain for about 2 months now. Patient denies any injury, pain level 5/10.

## 2014-08-10 NOTE — Progress Notes (Signed)
Cathy [name]  1950-12-14   Chief Complaint   Patient presents with   ??? Shoulder Pain     right        HISTORY OF PRESENT ILLNESS  Cathy [name] is a 64 y.o. female who presents today for evaluation of R shoulder pain x 2 months. Pt was asked for evaluation and treatment by Dr. Geoffry Paradise. She denies any known injuries. She describes pain associated with motion. She feels best with rest. Pt admits her pain and mobility is better now than what it had been in the past. She affirms she wakes up from pain if she lays on her R side.     Allergies   Allergen Reactions   ??? Tramadol Other (comments)     Psychotic Hallucinations   ??? Pcn [Penicillins] Itching     Itching bumps on hands and arms        Past Medical History   Diagnosis Date   ??? CHF (congestive heart failure) ([place]) 06/23/2008     Non-ischemic CMP , Cath (2008) Normal coronary arteries   ??? 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 Fibroid 06/23/2008   ??? HTN (hypertension) 06/23/2008   ??? HLD (hyperlipidemia)    ??? Breast mass, left      benign   ??? OSA (obstructive sleep apnea) 9/10   ??? Chronic systolic heart failure (HCC)      Stable,    ??? Automatic implantable cardiac defibrillator in situ      Post ddd icd Set up carelink   ??? Hypotension, unspecified      Related to lisinopril   ??? Venous reflux    ??? Left knee pain      With swelling   ??? Wears glasses    ??? Leg pain    ??? Degenerative arthritis of left knee    ??? 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 Marital Status: MARRIED     Spouse Name: N/A   ??? Number of Children: N/A   ??? 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 Smoking status: Never Smoker    ??? Smokeless tobacco: Never Used   ??? Alcohol Use: No   ??? Drug Use: No   ??? Sexual Activity:     Partners: Male     Patent examiner Protection: Condom     Other Topics Concern   ??? Not on file      Social History Narrative      Past Surgical History   Procedure Laterality Date   ??? Hx tonsil and adenoidectomy     ??? Hx tubal ligation     ??? Hx total abdominal hysterectomy  1996   ??? Hx cholecystectomy     ??? Hx pacemaker       dual chamber icd   ??? Hx craniotomy  2013     meningioma    ??? Hx breast biopsy       rt breast, benign   ??? Hx heent       glasses      Family History   Problem Relation Age of Onset   ??? Diabetes Mother    ??? Heart Attack Father  MI   ??? Heart Disease Maternal Grandmother    ??? Heart Disease Paternal Grandmother    ??? Kidney Disease Other      1 sib deceased   ??? Cancer Other      1 sib throat cancer   ??? Diabetes Other    ??? Diabetes Daughter    ??? Other Daughter      leukemia   ??? Hypertension Other       Current Outpatient Prescriptions   Medication Sig   ??? triamcinolone acetonide (KENALOG) 40 mg/mL injection 1 mL by Intra artICUlar route once for 1 dose.   ??? lidocaine (XYLOCAINE) 10 mg/mL (1 %) injection 6 mL by Intra artICUlar route once for 1 dose.   ??? albuterol (PROVENTIL HFA, VENTOLIN HFA, PROAIR HFA) 90 mcg/actuation inhaler Take 2 Puffs by inhalation every four (4) hours as needed for Wheezing or Shortness of Breath (Cough).   ??? inhalational spacing device ALWAYS USE WITH INHALER   ??? lisinopril (PRINIVIL, ZESTRIL) 5 mg tablet TAKE ONE TABLET BY MOUTH DAILY   ??? furosemide (LASIX) 20 mg tablet TAKE ONE TABLET BY MOUTH DAILY   ??? carvedilol (COREG) 3.125 mg tablet TAKE ONE TABLET BY MOUTH TWICE A DAY   ??? rosuvastatin (CRESTOR) 5 mg tablet TAKE ONE TABLET BY MOUTH IN THE EVENING   ??? chlorpheniramine-HYDROcodone (TUSSIONEX) 10-8 mg/5 mL suspension Take 5 mL by mouth every twelve (12) hours as needed for Cough. Max Daily Amount: 10 mL.     No current facility-administered medications for this visit.       REVIEW OF SYSTEM   Patient denies: Weight loss, Fever/Chills, HA, Visual changes, Fatigue, Chest pain, SOB, Abdominal pain, N/V/D/C, Blood in stool or urine, Edema.    Pertinent positive as above in HPI. All others were negative    PHYSICAL EXAM:   BP 102/51 mmHg   Pulse 82   Temp(Src) 96.8 ??F (36 ??C) (Oral)   Ht 5\' 10"  (1.778 m)   Wt 198 lb 6.4 oz (89.994 kg)   BMI 28.47 kg/m2  The patient is a well-developed, well-nourished female   in no acute distress.  The patient is alert and oriented times three.  The patient is alert and oriented times three. Mood and affect are normal.  LYMPHATIC: lymph nodes are not enlarged and are within normal limits  SKIN: normal in color and non tender to palpation. There are no bruises or abrasions noted.   NEUROLOGICAL: Motor sensory exam is within normal limits. Reflexes are equal bilaterally. There is normal sensation to pinprick and light touch  MUSCULOSKELETAL:  Examination Right shoulder   Skin Intact   AC joint tenderness -   Biceps tenderness -   Forward flexion/Elevation ROM 100   Active abduction ROM 120   Glenohumeral abduction 80   External rotation ROM 90   Internal rotation ROM 70   Apprehension -   Jobe???s Relocation -   Jerk -   Load and Shift -   O???briens -   Speeds -   Impingement sign -   Supraspinatus/Empty Can -, 5/5   External Rotation Strength -, 5/5   Lift Off/Belly Press -, 5/5   Neurovascular Intact         PROCEDURE: After sterile prep, 6 cc of Xylocaine and 1 cc of Kenalog were injected into the right shoulder.       Cathy [name] VIEW  OFFICE PROCEDURE PROGRESS NOTE  Chart reviewed for the following:  I, Richardo Hanks, MD, have reviewed the History, Physical and updated the Allergic reactions for Cathy [name] performed immediately prior to start of procedure:  I, Richardo Hanks, MD, have performed the following reviews on Cathy [name] prior to the start of the procedure:            * Patient was identified by name and date of birth   * Agreement on procedure being performed was verified   * Risks and Benefits explained to the patient  * Procedure site verified and marked as necessary  * Patient was positioned for comfort  * Consent was signed and verified     Time: 9:31 AM    Date of procedure: 08/10/2014    Procedure performed by:  Richardo Hanks, MD    Provider assisted by: (see medication administration)    How tolerated by patient: tolerated the procedure well with no complications    Comments: none     IMAGING: XR reviewed and read. Mild osteopenia.    IMPRESSION:      ICD-10-CM ICD-9-CM    1. Acute pain of right shoulder M25.511 719.41 AMB POC XRAY, SHOULDER; COMPLETE, 2+      REFERRAL TO PHYSICAL THERAPY      TRIAMCINOLONE ACETONIDE INJ      triamcinolone acetonide (KENALOG) 40 mg/mL injection      lidocaine (XYLOCAINE) 10 mg/mL (1 %) injection      PR DRAIN/INJECT LARGE JOINT/BURSA   2. Adhesive capsulitis of right shoulder M75.01 726.0 AMB POC XRAY, SHOULDER; COMPLETE, 2+      REFERRAL TO PHYSICAL THERAPY      TRIAMCINOLONE ACETONIDE INJ      triamcinolone acetonide (KENALOG) 40 mg/mL injection      lidocaine (XYLOCAINE) 10 mg/mL (1 %) injection      PR DRAIN/INJECT LARGE JOINT/BURSA        PLAN: 1. Pt will attend PT for frozen shoulder.  2. I injected the R subacromial space with cortisone.   3. She will return in 1 month.   Follow-up Disposition: Not on File    Scribed by Marjie Skiff [facility]) as dictated by Richardo Hanks, MD    Richardo Hanks, M.D.   [facility]

## 2014-08-11 ENCOUNTER — Inpatient Hospital Stay: Admit: 2014-08-11 | Payer: MEDICARE | Primary: Family Medicine

## 2014-08-11 DIAGNOSIS — M25511 Pain in right shoulder: Secondary | ICD-10-CM

## 2014-08-11 NOTE — Progress Notes (Signed)
PT DAILY TREATMENT NOTE - MCR 3-16    Patient Name: Cathy [name]  Date:08/11/2014  DOB: 27-Mar-1950    Patient DOB Verified  [place]or: [place] MED[place] / Plan: [place] MEDICARE PART A & B / Product Type: Medicare /    In time:9:37  Out time:10:20  Total Treatment Time (min): 43  Total Timed Codes (min): 43  1:1 Treatment Time ([place] only): 41   Visit #: 1 of 12    Treatment Area: Pain in right shoulder [M25.511]  Rotator cuff arthropathy, right [M12.811]    SUBJECTIVE  Pain Level (0-10 scale): 5/10  Any medication changes, allergies to medications, adverse drug reactions, diagnosis change, or new procedure performed?:  No     Yes (see summary sheet for update)  Subjective functional status/changes:    No changes reported  Pt reports chief complaint of pain and shoulder motion that started in April of this year with insidious onset.    OBJECTIVE  20 min Eval                  Re-Eval       15 min Therapeutic Exercise:   See flow sheet :   Rationale: increase ROM and increase strength to improve the patient???s ability to improve ADL ease.     8 min Manual Therapy:  PROM R shoulder: flexion/ABD/ER   Rationale: decrease pain, increase ROM and increase tissue extensibility to improve ADL ease.         With    TE    TA    neuro    other: Patient Education:  Review HEP     Progressed/Changed HEP based on:    positioning    body mechanics    transfers    heat/ice application     other:      Other Objective/Functional Measures: See IE     Pain Level (0-10 scale) post treatment: 4/10    ASSESSMENT/Changes in Function: See POC.     Patient will continue to benefit from skilled PT services to modify and progress therapeutic interventions, address functional mobility deficits, address ROM deficits, address strength deficits, analyze and address soft tissue restrictions, analyze and cue movement patterns, analyze and modify body mechanics/ergonomics, assess and modify postural abnormalities and  instruct in home and community integration to attain remaining goals.       See Plan of Care    See progress note/recertification    See Discharge Summary         Progress towards goals / Updated goals:  Short Term Goals: To be accomplished in 2?? weeks:  ????????????????????????1. The patient will be independent and compliant with HEP to maximize therapeutic benefit.,  ????????????????????????2. The patient will improve shoulder flexion PROM to 115 degrees to improve overhead reaching.  Long Term Goals: To be accomplished in 4?? weeks:  ????????????????????????1. The patient will improve FOTO score to 63 to maximize quality of life.  ????????????????????????2. The patient will improve R shoulder AROM to 120 degrees to improve ease of overhead reaching.  ????????????????????????3. The patient will improve functional ER to C3 in order to better perform hair dressing.  ????????????????????????4. The patient will improve functional IR to L5 in order to improve toileting efficiency.     PLAN    Upgrade activities as tolerated       Continue plan of care    Update interventions per flow sheet         Discharge due to:_    Other:_  Gerilyn Nestle, PT 08/11/2014  10:27 AM

## 2014-08-11 NOTE — Progress Notes (Signed)
[facility] ??? [facility]  [place] [address]  [phone]  [phone] fax    Plan of Care/ Statement of Necessity for Physical Therapy Services    Patient name: Cathy [name] Start of Care: 08/11/2014   Referral source: Thana Farr, MD DOB: Sep 17, 1950    Medical Diagnosis: Pain in right shoulder [M25.511]  Rotator cuff arthropathy, right [M12.811]   Onset Date: April 2016    Treatment Diagnosis: R shoulder adhesive capsulitis   Prior Hospitalization: see medical history Provider#: [id number]   Medications: Verified on Patient summary List    Comorbidities: Heart Disease, HTN, Pacemaker    Prior Level of Function: The patient reports having full use of her shoulder prior to onset.      The Plan of Care and following information is based on the information from the initial evaluation.  Assessment/ key information: The patient is a 64 year old female with a chief complaint of R shoulder pain that started about April of this year. She reports insidious onset with pain and gradual loss of shoulder movement. The patient reports she has had difficulty sleeping as well which has improved after a recent shoulder injection. She also notes that she has had radiographs of her R shoulder. The patient has signs and symptoms consistent with R shoulder adhesive capsulitis with impairments consisting of pain, decreased ROM/AROM, decreased joint mobility/capsular mobility, and poor ADL efficiency. The patient will benefit from skilled PT in order to address the above impairments.    Problem List: pain affecting function, decrease ROM, decrease strength, decrease ADL/ functional abilitiies, decrease activity tolerance and decrease flexibility/ joint mobility   Treatment Plan may include any combination of the following: Therapeutic exercise, Therapeutic activities, Neuromuscular re-education, Physical agent/modality, Manual therapy and Patient education   Patient / Family readiness to learn indicated by: asking questions, trying to perform skills and interest  Persons(s) to be included in education: patient (P)  Barriers to Learning/Limitations: None  Patient Goal (s): ???More motion, less pain???  Patient Self Reported Health Status: fair  Rehabilitation Potential: good    Short Term Goals: To be accomplished in 2  weeks:   1. The patient will be independent and compliant with HEP to maximize therapeutic benefit.,   2. The patient will improve shoulder flexion PROM to 115 degrees to improve overhead reaching.  Long Term Goals: To be accomplished in 4  weeks:   1. The patient will improve FOTO score to 63 to maximize quality of life.   2. The patient will improve R shoulder AROM to 120 degrees to improve ease of overhead reaching.   3. The patient will improve functional ER to C3 in order to better perform hair dressing.   4. The patient will improve functional IR to L5 in order to improve toileting efficiency.     Frequency / Duration: Patient to be seen 3 times per week for 4  weeks.    Patient/ Caregiver education and instruction: Diagnosis, prognosis, self care, activity modification and exercises     Plan of care has been reviewed with PTA    G-Codes (GP)   Carry  [phone] Current  CL= 60-79%   [id number] Goal  CJ= 20-39%    The severity rating is based on clinical judgment and the FOTO score.    Certification Period: 08/11/2014 - 11/11/2014   Gerilyn Nestle, PT 08/11/2014 10:20 AM    ________________________________________________________________________    I  certify that the above Therapy Services are being furnished while the patient is under my care. I agree with the treatment plan and certify that this therapy is necessary.    Physician's Signature:____________________  Date:____________Time: _________    Please sign and return to [facility] ??? [facility]  [place] [address]  [phone]  [phone] fax

## 2014-08-13 ENCOUNTER — Emergency Department: Admit: 2014-08-13 | Payer: MEDICARE | Primary: Family Medicine

## 2014-08-13 ENCOUNTER — Inpatient Hospital Stay
Admit: 2014-08-13 | Discharge: 2014-08-14 | Disposition: A | Discharge status: Home / Self Care | Payer: MEDICARE | Attending: Emergency Medicine

## 2014-08-13 DIAGNOSIS — K5900 Constipation, unspecified: Secondary | ICD-10-CM

## 2014-08-13 LAB — METABOLIC PANEL, COMPREHENSIVE
A-G Ratio: 1.2 (ref 0.8–1.7)
ALT (SGPT): 25 U/L (ref 13–56)
AST (SGOT): 10 U/L — ABNORMAL LOW (ref 15–37)
Albumin: 3.7 g/dL (ref 3.4–5.0)
Alk. phosphatase: 97 U/L (ref 45–117)
Anion gap: 4 mmol/L (ref 3.0–18)
BUN/Creatinine ratio: 14 (ref 12–20)
BUN: 15 MG/DL (ref 7.0–18)
Bilirubin, total: 0.5 MG/DL (ref 0.2–1.0)
CO2: 26 mmol/L (ref 21–32)
Calcium: 8.2 MG/DL — ABNORMAL LOW (ref 8.5–10.1)
Chloride: 108 mmol/L (ref 100–108)
Creatinine: 1.06 MG/DL (ref 0.6–1.3)
GFR est AA: >60 mL/min/{1.73_m2} (ref >60)
GFR est non-AA: 52 mL/min/{1.73_m2} — ABNORMAL LOW (ref >60)
Globulin: 3 g/dL (ref 2.0–4.0)
Glucose: 122 mg/dL — ABNORMAL HIGH (ref 74–99)
Potassium: 4 mmol/L (ref 3.5–5.5)
Protein, total: 6.7 g/dL (ref 6.4–8.2)
Sodium: 138 mmol/L (ref 136–145)

## 2014-08-13 LAB — CBC WITH AUTOMATED DIFF
ABS. BASOPHILS: 0 10*3/uL (ref 0.0–0.1)
ABS. EOSINOPHILS: 0 10*3/uL (ref 0.0–0.4)
ABS. LYMPHOCYTES: 1.3 10*3/uL (ref 0.9–3.6)
ABS. MONOCYTES: 0.5 10*3/uL (ref 0.05–1.2)
ABS. NEUTROPHILS: 3.7 10*3/uL (ref 1.8–8.0)
BASOPHILS: 0 % (ref 0–2)
EOSINOPHILS: 0 % (ref 0–5)
HCT: 34 % — ABNORMAL LOW (ref 35.0–45.0)
HGB: 11.2 g/dL — ABNORMAL LOW (ref 12.0–16.0)
LYMPHOCYTES: 24 % (ref 21–52)
MCH: 30.8 PG (ref 24.0–34.0)
MCHC: 32.9 g/dL (ref 31.0–37.0)
MCV: 93.4 FL (ref 74.0–97.0)
MONOCYTES: 9 % (ref 3–10)
MPV: 10.8 FL (ref 9.2–11.8)
NEUTROPHILS: 67 % (ref 40–73)
PLATELET: 229 10*3/uL (ref 135–420)
RBC: 3.64 M/uL — ABNORMAL LOW (ref 4.20–5.30)
RDW: 13.4 % (ref 11.6–14.5)
WBC: 5.5 10*3/uL (ref 4.6–13.2)

## 2014-08-13 LAB — CARDIAC PANEL,(CK, CKMB & TROPONIN)
CK - MB: <0.5 ng/ml — ABNORMAL LOW (ref 0.5–3.6)
CK: 26 U/L (ref 26–192)
Troponin-I, QT: <0.02 NG/ML (ref 0.0–0.045)

## 2014-08-13 LAB — PROTHROMBIN TIME + INR
INR: 1.1 (ref 0.8–1.2)
Prothrombin time: 13.9 s (ref 11.5–15.2)

## 2014-08-13 MED ORDER — IOPAMIDOL 61 % IV SOLN
300 mg iodine /mL (61 %) | Freq: Once | INTRAVENOUS | Status: AC
Start: 2014-08-13 — End: 2014-08-13
  Administered 2014-08-13: 23:00:00 via INTRAVENOUS

## 2014-08-13 MED FILL — ISOVUE-300  61 % INTRAVENOUS SOLUTION: 300 mg iodine /mL (61 %) | INTRAVENOUS | Qty: 100

## 2014-08-13 NOTE — ED Notes (Signed)
MD bedside to interrogate pacer and discuss plan of care with patient at this time. Questions encouraged.

## 2014-08-13 NOTE — ED Notes (Signed)
Patient ambulated slowly with assistance to bedside commode, she reports slight dizziness with standing initially but she was able to ambulate and transfer without difficulty. Will continue to monitor.

## 2014-08-13 NOTE — ED Notes (Signed)
[employer] tech currently at patients bedside attempting to interrogate her pacemaker.

## 2014-08-13 NOTE — ED Provider Notes (Addendum)
HPI Comments: 5:50 PM Cathy [name] is a 64 y.o. Female with a hx of CHF, who presents to the ED with c/o abd pain. Pt states that she was out, eating dinner with her husband at [facility] just PTA when she began experiencing abd pain. She states that she felt the pain, then felt hot and light-headed, and then passed out. Pt states that she was told that she passed out twice, however she only remembers passing out one time. Pt denies CP or SOB. This story was affirmed by her husband. There are no other concerns at this time.      Patient is a 64 y.o. female presenting with abdominal pain. The history is provided by the spouse.   Abdominal Pain   Pertinent negatives include no chest pain.        Past Medical History:   Diagnosis Date   ??? CHF (congestive heart failure) ([place]) 06/23/2008     Non-ischemic CMP , Cath (2008) Normal coronary arteries   ??? 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 Fibroid 06/23/2008   ??? HTN (hypertension) 06/23/2008   ??? HLD (hyperlipidemia)    ??? Breast mass, left      benign   ??? OSA (obstructive sleep apnea) 9/10   ??? Chronic systolic heart failure (HCC)      Stable,    ??? Automatic implantable cardiac defibrillator in situ      Post ddd icd Set up carelink   ??? Hypotension, unspecified      Related to lisinopril   ??? Venous reflux    ??? Left knee pain      With swelling   ??? Wears glasses    ??? Leg pain    ??? Degenerative arthritis of left knee    ??? 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 Hx tonsil and adenoidectomy     ??? Hx tubal ligation     ??? Hx total abdominal hysterectomy  1996   ??? Hx cholecystectomy     ??? Hx pacemaker       dual chamber icd   ??? Hx craniotomy  2013     meningioma    ??? Hx breast biopsy       rt breast, benign   ??? Hx heent       glasses         Family History:   Problem Relation Age of Onset   ??? Diabetes Mother    ??? Heart Attack Father      MI    ??? Heart Disease Maternal Grandmother    ??? Heart Disease Paternal Grandmother    ??? Kidney Disease Other      1 sib deceased   ??? Cancer Other      1 sib throat cancer   ??? Diabetes Other    ??? Diabetes Daughter    ??? Other Daughter      leukemia   ??? Hypertension Other        History     Social History   ??? Marital Status: MARRIED     Spouse Name: N/A   ??? Number of Children: N/A   ??? 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  Smoking status: Never Smoker    ??? Smokeless tobacco: Never Used   ??? Alcohol Use: No   ??? Drug Use: No   ??? Sexual Activity:     Partners: Male     Patent examiner Protection: Condom     Other Topics Concern   ??? Not on file     Social History Narrative         ALLERGIES: Tramadol and Pcn    Review of Systems   Respiratory: Negative for shortness of breath.    Cardiovascular: Negative for chest pain.   Gastrointestinal: Positive for abdominal pain.   All other systems reviewed and are negative.      Filed Vitals:    08/13/14 1854 08/13/14 1900 08/13/14 2206 08/13/14 2208   BP: 109/55 95/72 120/54    Pulse:  80  79   Temp:       Resp:  18  25   Height:       Weight:       SpO2:  100%  98%   100% on RA, indicating adequate oxygenation.           Physical Exam   Constitutional: She is oriented to person, place, and time. She appears well-developed.   HENT:   Head: Normocephalic and atraumatic.   Eyes: EOM are normal. Pupils are equal, round, and reactive to light.   Neck: Normal range of motion. Neck supple.   Cardiovascular: Normal rate, regular rhythm and normal heart sounds.  Exam reveals no friction rub.    No murmur heard.  Pulmonary/Chest: Effort normal and breath sounds normal. No respiratory distress. She has no wheezes.   Abdominal: Soft. She exhibits no distension. There is tenderness. There is no rebound and no guarding.   Epigastric TTP; no rebound.      Musculoskeletal: Normal range of motion.    Neurological: She is alert and oriented to person, place, and time.   Skin: Skin is warm and dry.   Psychiatric: She has a normal mood and affect. Her behavior is normal. Thought content normal.        MDM  Number of Diagnoses or Management Options  Diagnosis management comments:  EKG: Initial EKG was difficult to interpret because of PVCs in the inferior leads.  Repeat EKG showed a sinus at 74 normal axis normal intervals no ST elevation or depression T wave inversion in V3 V4 V5 and V6 1 and L consistent with LVH, LVH; compared to 5/30 /16 no change  6:43 PM pain resolved prior to ct    Defibrillator in place.     Last office visit reviewed. Nonischemic CM: normal coronaries  2008. SUMMARY:echo:01/2013  Left ventricle: The ventricle was moderately to severely dilated. Systolic  function was moderately to markedly reduced. Ejection fraction was  estimated in the range of 20 % to 25 %. There was severe diffuse  hypokinesis. Features were consistent with a pseudonormal left ventricular  filling pattern, with concomitant abnormal relaxation and increased  filling pressure (grade 2 diastolic dysfunction). Intracavitary echoes  most suggestive of apical trabeculations were present.       Pacemaker was interrogated and no treated VF or VT.  7 total monitored V. Tach's no other interventions.    Ct abd; negative. Will d/w cardiology; pt sees A Posey Pronto but [facility] Did pacemaker; will d/w CA.     Pt with no tx for VT in pacemaker; [employer] to come eval.     8:41 PM signed out to  dru Anh Bigos pending eval by [employer]       Procedures      Scribe Attestation:   Dalene Carrow acting as a scribe for and in the presence of Dr. Jacinto Reap, MD August 13, 2014 at 6:09 PM       Provider Attestation:   I personally performed the services described in the documentation, reviewed the documentation, as recorded by the scribe in my presence, and it accurately and completely records my words and actions.     Reviewed and signed by:   Dr. Jacinto Reap, MD      Note:  Assuming care of patient   from leaving provider    8:48 PM  I, Lawrence Santiago, MD, assumed care of patient from another provider who is ending their shift in the emergency department .    I introduced myself to the patient, explained that I was the physician who would be followed the remaining emergency department course. I subsequently reaffirmed the history by the preceding provider, Dr. Horace Porteous, and reexamined the patient.    No current facility-administered medications for this encounter.     Current Outpatient Prescriptions   Medication Sig   ??? chlorpheniramine-HYDROcodone (TUSSIONEX) 10-8 mg/5 mL suspension Take 5 mL by mouth every twelve (12) hours as needed for Cough. Max Daily Amount: 10 mL.   ??? albuterol (PROVENTIL HFA, VENTOLIN HFA, PROAIR HFA) 90 mcg/actuation inhaler Take 2 Puffs by inhalation every four (4) hours as needed for Wheezing or Shortness of Breath (Cough).   ??? inhalational spacing device ALWAYS USE WITH INHALER   ??? lisinopril (PRINIVIL, ZESTRIL) 5 mg tablet TAKE ONE TABLET BY MOUTH DAILY   ??? furosemide (LASIX) 20 mg tablet TAKE ONE TABLET BY MOUTH DAILY   ??? carvedilol (COREG) 3.125 mg tablet TAKE ONE TABLET BY MOUTH TWICE A DAY   ??? rosuvastatin (CRESTOR) 5 mg tablet TAKE ONE TABLET BY MOUTH IN THE EVENING       Past Medical History   Diagnosis Date   ??? CHF (congestive heart failure) ([place]) 06/23/2008     Non-ischemic CMP , Cath (2008) Normal coronary arteries   ??? 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 Fibroid 06/23/2008   ??? HTN (hypertension) 06/23/2008   ??? HLD (hyperlipidemia)    ??? Breast mass, left      benign   ??? OSA (obstructive sleep apnea) 9/10   ??? Chronic systolic heart failure (HCC)      Stable,    ??? Automatic implantable cardiac defibrillator in situ      Post ddd icd Set up carelink   ??? Hypotension, unspecified      Related to lisinopril   ??? Venous reflux    ??? Left knee pain      With swelling    ??? Wears glasses    ??? Leg pain    ??? Degenerative arthritis of left knee    ??? 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 Hx tonsil and adenoidectomy     ??? Hx tubal ligation     ??? Hx total abdominal hysterectomy  1996   ??? Hx cholecystectomy     ??? Hx pacemaker       dual chamber icd   ??? Hx craniotomy  2013     meningioma    ???  Hx breast biopsy       rt breast, benign   ??? Hx heent       glasses       Family History   Problem Relation Age of Onset   ??? Diabetes Mother    ??? Heart Attack Father      MI   ??? Heart Disease Maternal Grandmother    ??? Heart Disease Paternal Grandmother    ??? Kidney Disease Other      1 sib deceased   ??? Cancer Other      1 sib throat cancer   ??? Diabetes Other    ??? Diabetes Daughter    ??? Other Daughter      leukemia   ??? Hypertension Other        History     Social History   ??? Marital Status: MARRIED     Spouse Name: N/A   ??? Number of Children: N/A   ??? 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 Smoking status: Never Smoker    ??? Smokeless tobacco: Never Used   ??? Alcohol Use: No   ??? Drug Use: No   ??? Sexual Activity:     Partners: Male     Patent examiner Protection: Condom     Other Topics Concern   ??? Not on file     Social History Narrative       Allergies   Allergen Reactions   ??? Tramadol Other (comments)     Psychotic Hallucinations   ??? Pcn [Penicillins] Itching     Itching bumps on hands and arms       Patient's primary care provider (as noted in EPIC):  NOEL L HUNTE, MD    Abnormal lab results from this emergency department encounter:  Labs Reviewed   CBC WITH AUTOMATED DIFF - Abnormal; Notable for the following:     RBC 3.64 (*)     HGB 11.2 (*)     HCT 34.0 (*)     All other components within normal limits   METABOLIC PANEL, COMPREHENSIVE - Abnormal; Notable for the following:     Glucose 122 (*)     GFR est non-AA 52 (*)     Calcium 8.2 (*)     AST 10 (*)      All other components within normal limits   CARDIAC PANEL,(CK, CKMB & TROPONIN) - Abnormal; Notable for the following:     CK - MB <0.5 (*)     All other components within normal limits   PROTHROMBIN TIME + INR       Lab values for this patient within approximately the last 12 hours:  Recent Results (from the past 12 hour(s))   EKG, 12 LEAD, INITIAL    Collection Time: 08/13/14  5:33 PM   Result Value Ref Range    Ventricular Rate 82 BPM    Atrial Rate 82 BPM    P-R Interval 162 ms    QRS Duration 98 ms    Q-T Interval 416 ms    QTC Calculation (Bezet) 486 ms    Calculated P Axis 48 degrees    Calculated R Axis -2 degrees    Calculated T Axis 153 degrees    Diagnosis       Sinus rhythm with occasional premature ventricular complexes  Possible Left atrial enlargement  Left ventricular  hypertrophy with repolarization abnormality  Abnormal ECG  When compared with ECG of 24-Jul-2014 07:39,  fusion complexes are no longer present  T wave inversion more evident in Anterior leads     CBC WITH AUTOMATED DIFF    Collection Time: 08/13/14  5:36 PM   Result Value Ref Range    WBC 5.5 4.6 - 13.2 K/uL    RBC 3.64 (L) 4.20 - 5.30 M/uL    HGB 11.2 (L) 12.0 - 16.0 g/dL    HCT 34.0 (L) 35.0 - 45.0 %    MCV 93.4 74.0 - 97.0 FL    MCH 30.8 24.0 - 34.0 PG    MCHC 32.9 31.0 - 37.0 g/dL    RDW 13.4 11.6 - 14.5 %    PLATELET 229 135 - 420 K/uL    MPV 10.8 9.2 - 11.8 FL    NEUTROPHILS 67 40 - 73 %    LYMPHOCYTES 24 21 - 52 %    MONOCYTES 9 3 - 10 %    EOSINOPHILS 0 0 - 5 %    BASOPHILS 0 0 - 2 %    ABS. NEUTROPHILS 3.7 1.8 - 8.0 K/UL    ABS. LYMPHOCYTES 1.3 0.9 - 3.6 K/UL    ABS. MONOCYTES 0.5 0.05 - 1.2 K/UL    ABS. EOSINOPHILS 0.0 0.0 - 0.4 K/UL    ABS. BASOPHILS 0.0 0.0 - 0.1 K/UL    DF AUTOMATED     METABOLIC PANEL, COMPREHENSIVE    Collection Time: 08/13/14  5:36 PM   Result Value Ref Range    Sodium 138 136 - 145 mmol/L    Potassium 4.0 3.5 - 5.5 mmol/L    Chloride 108 100 - 108 mmol/L    CO2 26 21 - 32 mmol/L     Anion gap 4 3.0 - 18 mmol/L    Glucose 122 (H) 74 - 99 mg/dL    BUN 15 7.0 - 18 MG/DL    Creatinine 1.06 0.6 - 1.3 MG/DL    BUN/Creatinine ratio 14 12 - 20      GFR est AA >60 >60 ml/min/1.56m    GFR est non-AA 52 (L) >60 ml/min/1.784m   Calcium 8.2 (L) 8.5 - 10.1 MG/DL    Bilirubin, total 0.5 0.2 - 1.0 MG/DL    ALT 25 13 - 56 U/L    AST 10 (L) 15 - 37 U/L    Alk. phosphatase 97 45 - 117 U/L    Protein, total 6.7 6.4 - 8.2 g/dL    Albumin 3.7 3.4 - 5.0 g/dL    Globulin 3.0 2.0 - 4.0 g/dL    A-G Ratio 1.2 0.8 - 1.7     CARDIAC PANEL,(CK, CKMB & TROPONIN)    Collection Time: 08/13/14  5:36 PM   Result Value Ref Range    CK 26 26 - 192 U/L    CK - MB <0.5 (L) 0.5 - 3.6 ng/ml    CK-MB Index CANNOT BE CALCULATED 0.0 - 4.0 %    Troponin-I, Qt. <0.02 0.0 - 0.045 NG/ML   PROTHROMBIN TIME + INR    Collection Time: 08/13/14  5:36 PM   Result Value Ref Range    Prothrombin time 13.9 11.5 - 15.2 sec    INR 1.1 0.8 - 1.2     EKG, 12 LEAD, INITIAL    Collection Time: 08/13/14  5:56 PM   Result Value Ref Range    Ventricular Rate 74 BPM    Atrial  Rate 74 BPM    P-R Interval 170 ms    QRS Duration 98 ms    Q-T Interval 430 ms    QTC Calculation (Bezet) 477 ms    Calculated P Axis 42 degrees    Calculated R Axis -6 degrees    Calculated T Axis 141 degrees    Diagnosis       Normal sinus rhythm  Possible Left atrial enlargement  Left ventricular hypertrophy with repolarization abnormality  Abnormal ECG  When compared with ECG of 13-Aug-2014 17:33,  premature ventricular complexes are no longer present         Radiologist and cardiologist interpretations if available at time of this note:  CT ABD PELV W CONT   Final Result      XR CHEST PORT   Final Result            Medication(s) ordered for patient during this emergency visit encounter:  Medications   iopamidol (ISOVUE 300) 61 % contrast injection 50-100 mL (100 mL IntraVENous Given 08/13/14 1851)       Pacemaker interrogation by [employer] and consultation of [employer] rep  with Dr. Lyndel Safe are pending at time of assuming care.    10:21 PM  Pacemaker interrogator analyzed pacemaker and no recent arrhythmias the last 2 days.  He set up pacemaker to now monitor if HR> 150.  Interrogator spoke to   Dr. Lyndel Safe who is now OK with d/c home of patient.       IMPRESSION AND MEDICAL DECISION MAKING:  Based upon the patient???s presentation with noted HPI and PE, along with the work up done in the emergency department, I believe that the patient is having abdominal pain of uncertain etiology.  However, given the work up done in the emergency department, I am comfortable with discharge of the patient and outpatient follow up with the patient???s primary care doctor.    DIAGNOSIS:  1. Abdominal pain.    SPECIFIC PATIENT INSTRUCTIONS FROM THE PHYSICIAN WHO TREATED YOU IN THE ER TODAY:          1. Return if any concerns or worsening of condition(s).  2. FOLLOW UP APPOINTMENT:  Your primary doctor in 2-4 days for reevaluation.    3. FOLLOW UP APPOINTMENT:  Dr. Lyndel Safe in 2 days for reevaluation.      Aaron Edelman L. Leonard Schwartz, M.D.  ABEM Board Certified Emergency Physician    Provider Attestation:  If a scribe was utilized in generation of this patient record, I personally performed the services described in the documentation, reviewed the documentation, as recorded by the scribe in my presence, and it accurately records the patient's history of presenting illness, review of systems, patient physical examination, and procedures performed by me as the attending physician.     Aaron Edelman L. Leonard Schwartz, M.D.  ABEM Board Certified Emergency Physician  08/13/2014.  10:24 PM

## 2014-08-13 NOTE — ED Notes (Signed)
Patient provided with warm blanket at this time.

## 2014-08-13 NOTE — ED Notes (Signed)
Patient quietly resting in bed at this time. No pain noted. Call light within reach. Discussed plan of care. Questions encouraged. No questions noted at this time. Will continue to monitor.

## 2014-08-13 NOTE — ED Notes (Signed)
Bedside shift change report given to Sharyn Lull, RN (oncoming nurse) by Alver Fisher, RN (offgoing nurse). Report included the following information SBAR, ED Summary, Procedure Summary, Intake/Output, MAR, Recent Results, Med Rec Status and Cardiac Rhythm NSR with PVC's.

## 2014-08-13 NOTE — ED Notes (Signed)
Patient arrives via EMS at this time. Husband bedside at this time that states patient was at dinner when she became flushed, vomited, and had multiple syncopal epidsodes. EMS stated that upon their arrival that patient was complaining of a pounding abdominal pain, and was showing multiple PVC's on the monitor. BP 64/58 upon arrival. Patient received 750 mL of NS en route and BP went to 109/53.

## 2014-08-13 NOTE — ED Notes (Signed)
Bedside report received from [facility] assuming care of patient. No acute distress noted at this time. Vital signs are stable. Patient resting comfortably on stretcher, family member is at bedside

## 2014-08-13 NOTE — ED Notes (Signed)
Patient back from CT at this time. Patient placed back on bedside monitor and call light back within reach. Family bedside.

## 2014-08-13 NOTE — ED Notes (Signed)
Patient in bed at this time and on bedside monitor. Educated patient on plan of care and questions encouraged. Call light placed within reach, and patient educated on its use.

## 2014-08-13 NOTE — ED Notes (Signed)
I have reviewed discharge instructions with the patient.  The patient verbalized understanding.  Patient armband removed and shredded.  Pt is ambulatory with no acute distress noted at this time, the patient is alert, oriented and stable at time of discharge. Vital Signs stable.  Patient is being discharged without prescription at this time.

## 2014-08-13 NOTE — ED Notes (Signed)
Hardcopy discharge paperwork signed and completed by all parties, copy scanned into chart. Due to malfunction in signature keypad.

## 2014-08-13 NOTE — ED Notes (Signed)
MD bedside at this time to assess patient.

## 2014-08-13 NOTE — ED Notes (Signed)
X-Ray bedside at this time.

## 2014-08-14 ENCOUNTER — Encounter: Payer: MEDICARE | Primary: Family Medicine

## 2014-08-14 LAB — EKG, 12 LEAD, INITIAL
Atrial Rate: 74 {beats}/min
Atrial Rate: 82 {beats}/min
Calculated P Axis: 42 degrees
Calculated P Axis: 48 degrees
Calculated R Axis: -2 degrees
Calculated R Axis: -6 degrees
Calculated T Axis: 141 degrees
Calculated T Axis: 153 degrees
Diagnosis: NORMAL
P-R Interval: 162 ms
P-R Interval: 170 ms
Q-T Interval: 416 ms
Q-T Interval: 430 ms
QRS Duration: 98 ms
QRS Duration: 98 ms
QTC Calculation (Bezet): 477 ms
QTC Calculation (Bezet): 486 ms
Ventricular Rate: 74 {beats}/min
Ventricular Rate: 82 {beats}/min

## 2014-08-17 ENCOUNTER — Inpatient Hospital Stay: Admit: 2014-08-17 | Payer: MEDICARE | Primary: Family Medicine

## 2014-08-17 NOTE — Progress Notes (Signed)
PT DAILY TREATMENT NOTE - MCR 3-16    Patient Name: Cathy [name]  Date:08/17/2014  DOB: 1950/06/11    Patient DOB Verified  [place]: [place] / Plan: [place] PART [place] & B / Product Type: Medicare /    In time:8:00  Out time:8:42  Total Treatment Time (min): 42  Total Timed Codes (min): 42  1:1 Treatment Time ([place] only): 42   Visit #: 2 of 12    Treatment Area: Pain in right shoulder [M25.511]  Rotator cuff arthropathy, right [M12.811]    SUBJECTIVE  Pain Level (0-10 scale): 5/10  Any medication changes, allergies to medications, adverse drug reactions, diagnosis change, or new procedure performed?:  No     Yes (see summary sheet for update)  Subjective functional status/changes:    No changes reported    OBJECTIVE    Modality rationale: PD   Min Type Additional Details     Estim:  Unatt       IFC  Premod                        Other:  w/ice   w/heat  Position:  Location:     Estim: Att    TENS instruct  NMES                    Other:  w/US   w/ice   w/heat  Position:  Location:      Traction:  Cervical       Lumbar                        Prone          Supine                       Intermittent   Continuous Lbs:   before manual   after manual      Ultrasound: Continuous    Pulsed                           1MHz   3MHz Location:  W/cm2:      Iontophoresis with dexamethasone         Location:  Take home patch    In clinic      Ice       heat    Ice massage    Laser     Anodyne Position:  Location:      Laser with stim    Other: Position:  Location:      Vasopneumatic Device Pressure:        lo  med  hi   Temperature:  lo  med  hi    Skin assessment post-treatment:  intact redness- no adverse reaction    redness ??? adverse reaction:     34 min Therapeutic Exercise:   See flow sheet :   Rationale: increase ROM, increase strength and increase proprioception to improve the patient???s ability to perform ADL's.    8 min Manual Therapy:  GH joint mobs, PROM, STM/DTM (R) UT, lev scap.    Rationale: decrease pain, increase ROM, increase tissue extensibility and decrease trigger points to improve OH activity tolerance.          With    TE    TA    neuro    other: Patient Education:  Review HEP  Progressed/Changed HEP based on:    positioning    body mechanics    transfers    heat/ice application     other:      Other Objective/Functional Measures: Initiated exercises per flow sheet. Instructed pt how to correct forward posture and rounded shoulders, pt expressed she did not know her posture was bad.    Pain Level (0-10 scale) post treatment: 7/10    ASSESSMENT/Changes in Function: First F/U visit.    Patient will continue to benefit from skilled PT services to modify and progress therapeutic interventions, address functional mobility deficits, address ROM deficits, address strength deficits, analyze and address soft tissue restrictions and analyze and modify body mechanics/ergonomics to attain remaining goals.       See Plan of Care    See progress note/recertification    See Discharge Summary         Progress towards goals / Updated goals:  Short Term Goals: To be accomplished in 2?? weeks:  ????????????????????????1. The patient will be independent and compliant with HEP to maximize therapeutic benefit., - Initiated HEP today. 08/17/2014  ????????????????????????2. The patient will improve shoulder flexion PROM to 115 degrees to improve overhead reaching.  Long Term Goals: To be accomplished in 4?? weeks:  ????????????????????????1. The patient will improve FOTO score to 63 to maximize quality of life.  ????????????????????????2. The patient will improve R shoulder AROM to 120 degrees to improve ease of overhead reaching.  ????????????????????????3. The patient will improve functional ER to C3 in order to better perform hair dressing.  ????????????????????????4. The patient will improve functional IR to L5 in order to improve toileting efficiency.     PLAN    Upgrade activities as tolerated       Continue plan of care    Update interventions per flow sheet          Discharge due to:_    Other:_      Lestine Box, PTA 08/17/2014  7:59 AM

## 2014-08-18 ENCOUNTER — Inpatient Hospital Stay: Admit: 2014-08-18 | Payer: MEDICARE | Primary: Family Medicine

## 2014-08-18 ENCOUNTER — Ambulatory Visit: Admit: 2014-08-18 | Discharge: 2014-08-18 | Payer: MEDICARE | Attending: Specialist | Primary: Family Medicine

## 2014-08-18 DIAGNOSIS — R55 Syncope and collapse: Secondary | ICD-10-CM

## 2014-08-18 NOTE — Progress Notes (Signed)
HISTORY OF PRESENT ILLNESS  Cathy [name] is a 64 y.o. female.    HPI Comments: Patient with recent er visit for syncope  Happened while eating at the restaurant-icd eval-no significant arrythmia    CHF  The history is provided by the patient. This is a chronic problem. The problem occurs constantly. The problem has not changed since onset.Associated symptoms include shortness of breath. Pertinent negatives include no chest pain. The symptoms are aggravated by exertion.   Shortness of Breath  The history is provided by the patient. This is a recurrent problem. The problem occurs frequently.The problem has not changed since onset.Pertinent negatives include no fever, no cough, no sputum production, no hemoptysis, no wheezing, no PND, no orthopnea, no chest pain, no vomiting, no rash, no leg swelling and no claudication.       Review of Systems   Constitutional: Negative for fever and chills.   HENT: Negative for nosebleeds.    Eyes: Negative for blurred vision and double vision.   Respiratory: Positive for shortness of breath. Negative for cough, hemoptysis, sputum production and wheezing.    Cardiovascular: Negative for chest pain, palpitations, orthopnea, claudication, leg swelling and PND.   Gastrointestinal: Negative for heartburn, nausea and vomiting.   Musculoskeletal: Negative for myalgias.   Skin: Negative for rash.   Neurological: Positive for loss of consciousness. Negative for dizziness and weakness.   Endo/Heme/Allergies: Does not bruise/bleed easily.     Family History   Problem Relation Age of Onset   ??? Diabetes Mother    ??? Heart Attack Father      MI   ??? Heart Disease Maternal Grandmother    ??? Heart Disease Paternal Grandmother    ??? Kidney Disease Other      1 sib deceased   ??? Cancer Other      1 sib throat cancer   ??? Diabetes Other    ??? Diabetes Daughter    ??? Other Daughter      leukemia   ??? Hypertension Other        Past Medical History   Diagnosis Date    ??? CHF (congestive heart failure) ([place]) 06/23/2008     Non-ischemic CMP , Cath (2008) Normal coronary arteries   ??? 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 Fibroid 06/23/2008   ??? HTN (hypertension) 06/23/2008   ??? HLD (hyperlipidemia)    ??? Breast mass, left      benign   ??? OSA (obstructive sleep apnea) 9/10   ??? Chronic systolic heart failure (HCC)      Stable,    ??? Automatic implantable cardiac defibrillator in situ      Post ddd icd Set up [facility]   ??? Hypotension, unspecified      Related to lisinopril   ??? Venous reflux    ??? Left knee pain      With swelling   ??? Wears glasses    ??? Leg pain    ??? Degenerative arthritis of left knee    ??? 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 Hx tonsil and adenoidectomy     ??? Hx tubal ligation     ??? Hx total abdominal hysterectomy  1996   ??? Hx cholecystectomy     ??? Hx pacemaker       dual chamber  icd   ??? Hx craniotomy  2013     meningioma    ??? Hx breast biopsy       rt breast, benign   ??? Hx heent       glasses       Allergies   Allergen Reactions   ??? Tramadol Other (comments)     Psychotic Hallucinations   ??? Pcn [Penicillins] Itching     Itching bumps on hands and arms       Current Outpatient Prescriptions   Medication Sig   ??? chlorpheniramine-HYDROcodone (TUSSIONEX) 10-8 mg/5 mL suspension Take 5 mL by mouth every twelve (12) hours as needed for Cough. Max Daily Amount: 10 mL.   ??? albuterol (PROVENTIL HFA, VENTOLIN HFA, PROAIR HFA) 90 mcg/actuation inhaler Take 2 Puffs by inhalation every four (4) hours as needed for Wheezing or Shortness of Breath (Cough).   ??? inhalational spacing device ALWAYS USE WITH INHALER   ??? lisinopril (PRINIVIL, ZESTRIL) 5 mg tablet TAKE ONE TABLET BY MOUTH DAILY   ??? furosemide (LASIX) 20 mg tablet TAKE ONE TABLET BY MOUTH DAILY   ??? carvedilol (COREG) 3.125 mg tablet TAKE ONE TABLET BY MOUTH TWICE A DAY    ??? rosuvastatin (CRESTOR) 5 mg tablet TAKE ONE TABLET BY MOUTH IN THE EVENING     No current facility-administered medications for this visit.       BP 119/71 mmHg   Pulse 80   Ht 5\' 9"  (1.753 m)   Wt 89.812 kg (198 lb)   BMI 29.23 kg/m2      Physical Exam   Constitutional: She is oriented to person, place, and time. She appears well-developed and well-nourished.   HENT:   Head: Normocephalic and atraumatic.   Eyes: Conjunctivae are normal.   Neck: Neck supple. No JVD present. No tracheal deviation present. No thyromegaly present.   Cardiovascular: Normal rate and regular rhythm.  PMI is not displaced.  Exam reveals no gallop, no S3 and no decreased pulses.    Murmur heard.   Holosystolic murmur is present with a grade of 2/6  at the lower left sternal border and apex  Pulmonary/Chest: No respiratory distress. She has no wheezes. She has no rales. She exhibits no tenderness.   Abdominal: Soft. There is no tenderness.   Musculoskeletal: She exhibits no edema.   Neurological: She is alert and oriented to person, place, and time.   Skin: Skin is warm.   Psychiatric: She has a normal mood and affect.     CARDIOLOGY STUDIES 08/25/2010 10/25/2008 07/25/2008 01/25/2008 02/24/2006   Myocardial Perfusion Scan Result - - FIXED INF AND REVERSIBLE ANT APICAL DEFECT - -   Cardiac Cath Result - - - - EF 25%, NO SIGNIFICANT CAD   Echocardiogram - Complete Result DILATED 20%, MILD MR - - EF 25% -   Doppler US Arterial Result - NL SCAN - - -           Ms. Girdler has a reminder for a "due or due soon" health maintenance. I have asked that she contact her primary care provider for follow-up on this health maintenance.    SUMMARY:echo:01/2013  Left ventricle: The ventricle was moderately to severely dilated. Systolic  function was moderately to markedly reduced. Ejection fraction was  estimated in the range of 20 % to 25 %. There was severe diffuse  hypokinesis. Features were consistent with a pseudonormal left ventricular   filling pattern, with concomitant abnormal relaxation and increased  filling pressure (  grade 2 diastolic dysfunction). Intracavitary echoes  most suggestive of apical trabeculations were present.    Left atrium: The atrium was severely dilated. LA volume index was 91  ml/m squared.    Right atrium: The atrium was mildly dilated.    Mitral valve: There was mild annular calcification. There was mild diffuse  thickening of the anterior and posterior leaflets. In certain images the  leaflet tips exhibit minimally reduced coaptation. There was moderate to  severe regurgitation. Mean transmitral gradient was 2.7 mmHg. The  effective orifice of mitral regurgitation by proximal isovelocity surface  area was 0.22 cm squared. The volume of mitral regurgitation by proximal  isovelocity surface area was 39 ml.    Tricuspid valve: There was mild regurgitation. Pulmonary artery systolic  pressure: 56 mmHg.     Assessment      ICD-10-CM ICD-9-CM    1. Syncope, unspecified syncope type R55 780.2 2D ECHO COMPLETE ADULT (TTE)    possible vagal or orthostatic  icd interogation no significant arrythmia   2. Chronic systolic heart failure (HCC) I50.22 428.22 2D ECHO COMPLETE ADULT (TTE)    stable   3. Cardiomyopathy ([place]) I42.9 425.4    4. AICD (automatic cardioverter/defibrillator) present Z95.810 V45.02     normal function     On low dose coreg and lisinopril as Patient had hypotension in past with higher dose  There are no discontinued medications.    Orders Placed This Encounter   ??? 2D ECHO COMPLETE ADULT (TTE)     Standing Status: Future      Number of Occurrences:       Standing Expiration Date: 02/14/2015     Order Specific Question:  Reason for Exam:     Answer:  see diagnosis       Follow-up Disposition:  Return in about 3 months (around 11/18/2014).

## 2014-08-18 NOTE — Progress Notes (Signed)
1. Have you been to the ER, urgent care clinic since your last visit?  Hospitalized since your last visit?     Yes Where: [facility]    2. Have you seen or consulted any other health care providers outside of the [facility] since your last visit?  Include any pap smears or colon screening.      No     3.  Since your last visit, have you had any of the following symptoms? no    4.  Have you had any blood work, X-rays or cardiac testing?      Yes Where: [facility]

## 2014-08-18 NOTE — Progress Notes (Signed)
PT DAILY TREATMENT NOTE - MCR 3-16    Patient Name: Cathy [name]  Date:08/18/2014  DOB: 02/18/1951    Patient DOB Verified  [place]or: [place] MEDICARE / Plan: [place] MEDICARE PART A & B / Product Type: Medicare /    In time:9:01  Out time:9:35  Total Treatment Time (min): 34  Total Timed Codes (min): 34  1:1 Treatment Time ([facility] only): 21   Visit #: 3 of 12    Treatment Area: Pain in right shoulder [M25.511]  Rotator cuff arthropathy, right [M12.811]    SUBJECTIVE  Pain Level (0-10 scale): 3/10  Any medication changes, allergies to medications, adverse drug reactions, diagnosis change, or new procedure performed?:  No     Yes (see summary sheet for update)  Subjective functional status/changes:    No changes reported  "I was really sore, I tried cleaning my oven and realized I was using my (R) arm and it hurt so I used my left."    OBJECTIVE    Modality rationale: PD ice.   Min Type Additional Details     Estim:  Unatt       IFC  Premod                        Other:  w/ice   w/heat  Position:  Location:     Estim: Att    TENS instruct  NMES                    Other:  w/US   w/ice   w/heat  Position:  Location:      Traction:  Cervical       Lumbar                        Prone          Supine                       Intermittent   Continuous Lbs:   before manual   after manual      Ultrasound: Continuous    Pulsed                           1MHz   3MHz W/cm2:  Location:      Iontophoresis with dexamethasone         Location:  Take home patch    In clinic      Ice       heat    Ice massage    Laser     Anodyne Position:  Location:      Laser with stim    Other:  Position:  Location:      Vasopneumatic Device Pressure:        lo  med  hi   Temperature:  lo  med  hi    Skin assessment post-treatment:  intact redness- no adverse reaction    redness ??? adverse reaction:     26 min Therapeutic Exercise:   See flow sheet :   Rationale: increase ROM, increase strength and increase proprioception to  improve the patient???s ability to perform functional activities.    8 min Manual Therapy:  (R) GH joint mobs, PROM.   Rationale: decrease pain, increase ROM and increase tissue extensibility to improve OH activity tolerance.          With  TE    TA    neuro    other: Patient Education:  Review HEP     Progressed/Changed HEP based on:    positioning    body mechanics    transfers    heat/ice application     other:      Other Objective/Functional Measures: Significant guarding with PROM. Limited ROM in all directions, primarily flex and abd. Pt reports attempting HEP since previous visit.    Pain Level (0-10 scale) post treatment: 6/10    ASSESSMENT/Changes in Function:     Patient will continue to benefit from skilled PT services to modify and progress therapeutic interventions, address functional mobility deficits, address ROM deficits, address strength deficits, analyze and address soft tissue restrictions and analyze and modify body mechanics/ergonomics to attain remaining goals.       See Plan of Care    See progress note/recertification    See Discharge Summary         Progress towards goals / Updated goals:  Short Term Goals: To be accomplished in 2?? weeks:  ????????????????????????1. The patient will be independent and compliant with HEP to maximize therapeutic benefit., - Initiated HEP today. 08/17/2014  ????????????????????????2. The patient will improve shoulder flexion PROM to 115 degrees to improve overhead reaching.  Long Term Goals: To be accomplished in 4?? weeks:  ????????????????????????1. The patient will improve FOTO score to 63 to maximize quality of life.  ????????????????????????2. The patient will improve R shoulder AROM to 120 degrees to improve ease of overhead reaching.  ????????????????????????3. The patient will improve functional ER to C3 in order to better perform hair dressing.  ????????????????????????4. The patient will improve functional IR to L5 in order to improve toileting efficiency.     PLAN     Upgrade activities as tolerated       Continue plan of care    Update interventions per flow sheet         Discharge due to:_    Other:_      Lestine Box, PTA 08/18/2014  9:17 AM

## 2014-08-21 ENCOUNTER — Encounter: Payer: MEDICARE | Primary: Family Medicine

## 2014-08-23 ENCOUNTER — Encounter: Admit: 2014-08-23 | Discharge: 2014-08-23 | Payer: MEDICARE | Primary: Family Medicine

## 2014-08-23 ENCOUNTER — Inpatient Hospital Stay: Admit: 2014-08-23 | Payer: MEDICARE | Primary: Family Medicine

## 2014-08-23 NOTE — Progress Notes (Signed)
PT DAILY TREATMENT NOTE - MCR 3-16    Patient Name: Cathy [name]  Date:08/23/2014  DOB: Mar 18, 1950    Patient DOB Verified  Payor: [place] MEDICARE / Plan: [place] MEDICARE PART A & B / Product Type: Medicare /    In time:12:32  Out time:1:09  Total Treatment Time (min): 37  Total Timed Codes (min): 37  1:1 Treatment Time ([place] only): 18  Visit #: 4 of 12    Treatment Area: Pain in right shoulder [M25.511]  Rotator cuff arthropathy, right [M12.811]    SUBJECTIVE  Pain Level (0-10 scale): 0/10  Any medication changes, allergies to medications, adverse drug reactions, diagnosis change, or new procedure performed?:  No     Yes (see summary sheet for update)  Subjective functional status/changes:    No changes reported  "No pain right now."    OBJECTIVE    Modality rationale: PD   Min Type Additional Details     Estim:  Unatt       IFC  Premod                        Other:  w/ice   w/heat  Position:  Location:     Estim: Att    TENS instruct  NMES                    Other:  w/US   w/ice   w/heat  Position:  Location:      Traction:  Cervical       Lumbar                        Prone          Supine                       Intermittent   Continuous Lbs:   before manual   after manual      Ultrasound: Continuous    Pulsed                           1MHz   3MHz Location:  W/cm2:      Iontophoresis with dexamethasone         Location:  Take home patch    In clinic      Ice       heat    Ice massage    Laser     Anodyne Position:  Location:      Laser with stim    Other: Position:  Location:      Vasopneumatic Device Pressure:        lo  med  hi   Temperature:  lo  med  hi    Skin assessment post-treatment:  intact redness- no adverse reaction    redness ??? adverse reaction:     29 min Therapeutic Exercise:   See flow sheet :   Rationale: increase ROM, increase strength and increase proprioception to improve the patient???s ability to perform functional activities.    8 min Manual Therapy:  DTM (R) UT, (R) GH joint mobs, PROM.    Rationale: decrease pain, increase ROM, increase tissue extensibility and decrease trigger points to improve OH activity tolerance.     With    TE    TA    neuro    other: Patient Education:  Review HEP  Progressed/Changed HEP based on:    positioning    body mechanics    transfers    heat/ice application     other:      Other Objective/Functional Measures: PROM progressing, significant (R) UE mm guarding during PROM.    Pain Level (0-10 scale) post treatment: 7/10    ASSESSMENT/Changes in Function: Pt reports being able to apply deodorant under (R) arm.    Patient will continue to benefit from skilled PT services to modify and progress therapeutic interventions, address functional mobility deficits, address ROM deficits, address strength deficits, analyze and address soft tissue restrictions and analyze and modify body mechanics/ergonomics to attain remaining goals.       See Plan of Care    See progress note/recertification    See Discharge Summary         Progress towards goals / Updated goals:  Short Term Goals: To be accomplished in 2?? weeks:  ????????????????????????1. The patient will be independent and compliant with HEP to maximize therapeutic benefit., - Initiated HEP today. 08/17/2014  ????????????????????????2. The patient will improve shoulder flexion PROM to 115 degrees to improve overhead reaching.  Long Term Goals: To be accomplished in 4?? weeks:  ????????????????????????1. The patient will improve FOTO score to 63 to maximize quality of life. - Reassess next visit. 08/23/2014  ????????????????????????2. The patient will improve R shoulder AROM to 120 degrees to improve ease of overhead reaching.  ????????????????????????3. The patient will improve functional ER to C3 in order to better perform hair dressing.  ????????????????????????4. The patient will improve functional IR to L5 in order to improve toileting efficiency.     PLAN    Upgrade activities as tolerated       Continue plan of care    Update interventions per flow sheet         Discharge due to:_     Other:_      Lestine Box, PTA 08/23/2014  1:19 PM

## 2014-08-25 ENCOUNTER — Inpatient Hospital Stay: Admit: 2014-08-25 | Payer: MEDICARE | Primary: Family Medicine

## 2014-08-25 DIAGNOSIS — M25511 Pain in right shoulder: Secondary | ICD-10-CM

## 2014-08-25 NOTE — Progress Notes (Signed)
PT DAILY TREATMENT NOTE - MCR 3-16    Patient Name: Cathy [name]  Date:08/25/2014  DOB: 1950-12-11    Patient DOB Verified  Payor: VA MEDICARE / Plan: VA MEDICARE PART A & B / Product Type: Medicare /    In time:8:00  Out time:8:39  Total Treatment Time (min): 39  Total Timed Codes (min): 39  1:1 Treatment Time ([place] only): 19   Visit #: 5 of 12    Treatment Area: Pain in right shoulder [M25.511]  Rotator cuff arthropathy, right [M12.811]    SUBJECTIVE  Pain Level (0-10 scale): 4/10  Any medication changes, allergies to medications, adverse drug reactions, diagnosis change, or new procedure performed?:  No     Yes (see summary sheet for update)  Subjective functional status/changes:    No changes reported  "It's a little sore."    OBJECTIVE    Modality rationale: PD   Min Type Additional Details     Estim:  Unatt       IFC  Premod                        Other:  w/ice   w/heat  Position:  Location:     Estim: Att    TENS instruct  NMES                    Other:  w/US   w/ice   w/heat  Position:  Location:      Traction:  Cervical       Lumbar                        Prone          Supine                       Intermittent   Continuous Lbs:   before manual   after manual      Ultrasound: Continuous    Pulsed                           1MHz   3MHz Location:  W/cm2:      Iontophoresis with dexamethasone         Location:  Take home patch    In clinic      Ice       heat    Ice massage    Laser     Anodyne Position:  Location:      Laser with stim    Other: Position:  Location:      Vasopneumatic Device Pressure:        lo  med  hi   Temperature:  lo  med  hi    Skin assessment post-treatment:  intact redness- no adverse reaction    redness ??? adverse reaction:      31 min Therapeutic Exercise:   See flow sheet :   Rationale: increase ROM, increase strength and increase proprioception to improve the patient???s ability to perform functional activities.     8 min Manual Therapy:  (R) GH joint mobs, PROM, DTM/TPR (R) UT, pec release.   Rationale: decrease pain, increase ROM, increase tissue extensibility and decrease trigger points to improve OH activity tolerance.    With    TE    TA    neuro    other: Patient Education:  Review HEP  Progressed/Changed HEP based on:    positioning    body mechanics    transfers    heat/ice application     other:      Other Objective/Functional Measures: (R) Shoulder flex AROM 101, flex PROM 121 degrees.     Pain Level (0-10 scale) post treatment: 2/10    ASSESSMENT/Changes in Function: FOTO 45%.    Patient will continue to benefit from skilled PT services to modify and progress therapeutic interventions, address functional mobility deficits, address ROM deficits, address strength deficits, analyze and address soft tissue restrictions and analyze and modify body mechanics/ergonomics to attain remaining goals.       See Plan of Care    See progress note/recertification    See Discharge Summary         Progress towards goals / Updated goals:  Short Term Goals: To be accomplished in 2?? weeks:  ????????????????????????1. The patient will be independent and compliant with HEP to maximize therapeutic benefit., - Initiated HEP today. 08/17/2014  ????????????????????????2. The patient will improve shoulder flexion PROM to 115 degrees to improve overhead reaching. - flex PROM 121 degrees. 08/25/2014  Long Term Goals: To be accomplished in 4?? weeks:  ????????????????????????1. The patient will improve FOTO score to 63 to maximize quality of life. - 45%. 08/25/2014  ????????????????????????2. The patient will improve R shoulder AROM to 120 degrees to improve ease of overhead reaching. - flex AROM 101. 08/25/2014  ????????????????????????3. The patient will improve functional ER to C3 in order to better perform hair dressing.  ????????????????????????4. The patient will improve functional IR to L5 in order to improve toileting efficiency.     PLAN    Upgrade activities as tolerated       Continue plan of care     Update interventions per flow sheet         Discharge due to:_    Other:_      Lestine Box, PTA 08/25/2014  9:11 AM

## 2014-08-27 ENCOUNTER — Encounter

## 2014-08-29 ENCOUNTER — Inpatient Hospital Stay: Admit: 2014-08-29 | Payer: MEDICARE | Primary: Family Medicine

## 2014-08-29 NOTE — Progress Notes (Signed)
PT DAILY TREATMENT NOTE - MCR 3-16    Patient Name: Cathy [name]  Date:08/29/2014  DOB: Oct 06, 1950    Patient DOB Verified  Payor: [place] MEDICARE / Plan: VA MEDICARE PART A & B / Product Type: Medicare /    In time:9:00  Out time:9:38  Total Treatment Time (min): 38  Total Timed Codes (min): 38  1:1 Treatment Time ([facility] only): 38   Visit #: 6 of 12     Treatment Area: Pain in right shoulder [M25.511]  Rotator cuff arthropathy, right [M12.811]    SUBJECTIVE  Pain Level (0-10 scale): 1/10  Any medication changes, allergies to medications, adverse drug reactions, diagnosis change, or new procedure performed?:  No     Yes (see summary sheet for update)  Subjective functional status/changes:    No changes reported  "My shoulder is doing better." She reports partial compliance with HEP.      OBJECTIVE   30 min Therapeutic Exercise:   See flow sheet :   Rationale: increase ROM and increase strength to improve the patient???s ability to improve ADL ease.     8 min Manual Therapy:  PROM; flexion, ABD, ER   Rationale: decrease pain, increase ROM and increase tissue extensibility to improve ADL ease.         With    TE    TA    neuro    other: Patient Education:  Review HEP     Progressed/Changed HEP based on:    positioning    body mechanics    transfers    heat/ice application     other:      Other Objective/Functional Measures:   PROM: flexion: 117, ABD 97 degrees   Functional IR: iliac crest  Functional ER: C7    Pain Level (0-10 scale) post treatment: 2/10    ASSESSMENT/Changes in Function: Progressing with PROM, decreased a few degrees from last visit, but improved since initial evaluation. Have encouraged patient regarding HEP compliance.     Patient will continue to benefit from skilled PT services to modify and progress therapeutic interventions, address functional mobility deficits, address ROM deficits, address strength deficits, analyze and address soft  tissue restrictions, analyze and cue movement patterns, analyze and modify body mechanics/ergonomics, assess and modify postural abnormalities and instruct in home and community integration to attain remaining goals.       See Plan of Care    See progress note/recertification    See Discharge Summary         Progress towards goals / Updated goals:  Short Term Goals: To be accomplished in 2?? weeks:  ????????????????????????1. The patient will be independent and compliant with HEP to maximize therapeutic benefit., - Initiated HEP today. 08/17/2014  ????????????????????????2. The patient will improve shoulder flexion PROM to 115 degrees to improve overhead reaching. - flex PROM 121 degrees. 08/25/2014  Long Term Goals: To be accomplished in 4?? weeks:  ????????????????????????1. The patient will improve FOTO score to 63 to maximize quality of life. - 45%. 08/25/2014  ????????????????????????2. The patient will improve R shoulder AROM to 120 degrees to improve ease of overhead reaching. - flex AROM 101. 08/25/2014  ????????????????????????3. The patient will improve functional ER to C3 in order to better perform hair dressing. Met C7 08/29/2014  ????????????????????????4. The patient will improve functional IR to L5 in order to improve toileting efficiency. Not met - Iliac crest 08/29/2014.     PLAN    Upgrade activities as tolerated  Continue plan of care    Update interventions per flow sheet         Discharge due to:_    Other:_      Gerilyn Nestle, PT 08/29/2014  9:05 AM

## 2014-08-30 ENCOUNTER — Inpatient Hospital Stay: Admit: 2014-08-30 | Payer: MEDICARE | Primary: Family Medicine

## 2014-08-30 NOTE — Progress Notes (Signed)
PT DAILY TREATMENT NOTE - MCR 3-16    Patient Name: Cathy [name]  Date:08/30/2014  DOB: May 15, 1950    Patient DOB Verified  [place]or: [place] MED[place] / Plan: [place] MED[place]ARE PART A & B / Product Type: Medicare /    In time:9:05  Out time:9:40  Total Treatment Time (min): 35  Total Timed Codes (min): 35  1:1 Treatment Time ([place] only): 35   Visit #: 7 of 12    Treatment Area: Pain in right shoulder [M25.511]  Rotator cuff arthropathy, right [M12.811]    SUBJECTIVE  Pain Level (0-10 scale): 0/10  Any medication changes, allergies to medications, adverse drug reactions, diagnosis change, or new procedure performed?:  No     Yes (see summary sheet for update)  Subjective functional status/changes:    No changes reported  "It's doing better." [in reference to the R shoulder]     OBJECTIVE  27 min Therapeutic Exercise:   See flow sheet :   Rationale: increase ROM and increase strength to improve the patient???s ability to improve ADL ease.    8 min Manual Therapy:  PROM - flexion/ABD/ER, GH mobs R shoulder   Rationale: decrease pain, increase ROM and increase tissue extensibility to improve overhead reaching.        With    TE    TA    neuro    other: Patient Education:  Review HEP     Progressed/Changed HEP based on:    positioning    body mechanics    transfers    heat/ice application     other:      Other Objective/Functional Measures: Progressing with ROM, added MH during PROM to good effect.     Pain Level (0-10 scale) post treatment: 0/10    ASSESSMENT/Changes in Function: Encouraged the patient to remain compliant with HEP with patient verbalizing understanding.    Patient will continue to benefit from skilled PT services to modify and progress therapeutic interventions, address functional mobility deficits, address ROM deficits, address strength deficits, analyze and address soft tissue restrictions, analyze and cue movement patterns, analyze and modify  body mechanics/ergonomics, assess and modify postural abnormalities and instruct in home and community integration to attain remaining goals.       See Plan of Care    See progress note/recertification    See Discharge Summary         Progress towards goals / Updated goals:  Short Term Goals: To be accomplished in 2?? weeks:  ????????????????????????1. The patient will be independent and compliant with HEP to maximize therapeutic benefit., - Initiated HEP today. 08/17/2014  ????????????????????????2. The patient will improve shoulder flexion PROM to 115 degrees to improve overhead reaching. - flex PROM 121 degrees. 08/25/2014  Long Term Goals: To be accomplished in 4?? weeks:  ????????????????????????1. The patient will improve FOTO score to 63 to maximize quality of life. - 45%. 08/25/2014  ????????????????????????2. The patient will improve R shoulder AROM to 120 degrees to improve ease of overhead reaching. - flex AROM 101. 08/25/2014  ????????????????????????3. The patient will improve functional ER to C3 in order to better perform hair dressing. Met C7 08/29/2014  ????????????????????????4. The patient will improve functional IR to L5 in order to improve toileting efficiency. Not met - Iliac crest 08/29/2014.     PLAN    Upgrade activities as tolerated       Continue plan of care    Update interventions per flow sheet  Discharge due to:_    Other:_      Gerilyn Nestle, PT 08/30/2014  9:26 AM

## 2014-09-01 ENCOUNTER — Inpatient Hospital Stay: Admit: 2014-09-01 | Payer: MEDICARE | Primary: Family Medicine

## 2014-09-01 NOTE — Progress Notes (Signed)
PT DAILY TREATMENT NOTE - MCR 3-16    Patient Name: Cathy [name]  Date:09/01/2014  DOB: 1950/10/25    Patient DOB Verified  [place]or: [place] MEDICARE / Plan: [place] MEDICARE PART A & B / Product Type: Medicare /    In time:9:05  Out time:9:40  Total Treatment Time (min): 35  Total Timed Codes (min): 35  1:1 Treatment Time ([place] only): 25   Visit #: 8 of 12    Treatment Area: Pain in right shoulder [M25.511]  Rotator cuff arthropathy, right [M12.811]    SUBJECTIVE  Pain Level (0-10 scale): 0/10  Any medication changes, allergies to medications, adverse drug reactions, diagnosis change, or new procedure performed?:  No     Yes (see summary sheet for update)  Subjective functional status/changes:    No changes reported  Pt reports she is making gradual progress.      OBJECTIVE  27 min Therapeutic Exercise:   See flow sheet :   Rationale: increase ROM and increase strength to improve the patient???s ability to improve ADL ease.    8 min Manual Therapy:  PROM all planes, GH mobs R shoulder   Rationale: decrease pain, increase ROM and increase tissue extensibility to improve overhead reaching.        With    TE    TA    neuro    other: Patient Education:  Review HEP     Progressed/Changed HEP based on:    positioning    body mechanics    transfers    heat/ice application     other:      Other Objective/Functional Measures:  No changes to therex     Pain Level (0-10 scale) post treatment: 0/10    ASSESSMENT/Changes in Function:  Pt making gradual progress with ROM.     Patient will continue to benefit from skilled PT services to modify and progress therapeutic interventions, address functional mobility deficits, address ROM deficits, address strength deficits, analyze and address soft tissue restrictions, analyze and cue movement patterns, analyze and modify body mechanics/ergonomics, assess and modify postural abnormalities and instruct in home and community integration to attain remaining goals.       See Plan of Care     See progress note/recertification    See Discharge Summary         Progress towards goals / Updated goals:  Short Term Goals: To be accomplished in 2?? weeks:  ????????????????????????1. The patient will be independent and compliant with HEP to maximize therapeutic benefit., - Initiated HEP today. 08/17/2014  ????????????????????????2. The patient will improve shoulder flexion PROM to 115 degrees to improve overhead reaching. - flex PROM 121 degrees. 08/25/2014  Long Term Goals: To be accomplished in 4?? weeks:  ????????????????????????1. The patient will improve FOTO score to 63 to maximize quality of life. - 45%. 08/25/2014  ????????????????????????2. The patient will improve R shoulder AROM to 120 degrees to improve ease of overhead reaching. - flex AROM 101. 08/25/2014  ????????????????????????3. The patient will improve functional ER to C3 in order to better perform hair dressing. Met C7 08/29/2014  ????????????????????????4. The patient will improve functional IR to L5 in order to improve toileting efficiency. Not met - Iliac crest 08/29/2014.     PLAN    Upgrade activities as tolerated       Continue plan of care    Update interventions per flow sheet         Discharge due to:_    Other:_  Minta Balsam, PT 09/01/2014  9:46 AM

## 2014-09-04 ENCOUNTER — Inpatient Hospital Stay: Admit: 2014-09-04 | Payer: MEDICARE | Primary: Family Medicine

## 2014-09-04 ENCOUNTER — Encounter: Admit: 2014-09-04 | Discharge: 2014-09-04 | Payer: MEDICARE | Attending: Internal Medicine | Primary: Family Medicine

## 2014-09-04 DIAGNOSIS — I10 Essential (primary) hypertension: Secondary | ICD-10-CM

## 2014-09-04 LAB — METABOLIC PANEL, COMPREHENSIVE
A-G Ratio: 1.4 (ref 0.8–1.7)
ALT (SGPT): 21 U/L (ref 13–56)
AST (SGOT): 9 U/L — ABNORMAL LOW (ref 15–37)
Albumin: 3.6 g/dL (ref 3.4–5.0)
Alk. phosphatase: 78 U/L (ref 45–117)
Anion gap: 7 mmol/L (ref 3.0–18)
BUN/Creatinine ratio: 22 — ABNORMAL HIGH (ref 12–20)
BUN: 15 MG/DL (ref 7.0–18)
Bilirubin, total: 0.2 MG/DL (ref 0.2–1.0)
CO2: 26 mmol/L (ref 21–32)
Calcium: 8.3 MG/DL — ABNORMAL LOW (ref 8.5–10.1)
Chloride: 108 mmol/L (ref 100–108)
Creatinine: 0.69 MG/DL (ref 0.6–1.3)
GFR est AA: >60 mL/min/{1.73_m2} (ref >60)
GFR est non-AA: >60 mL/min/{1.73_m2} (ref >60)
Globulin: 2.6 g/dL (ref 2.0–4.0)
Glucose: 85 mg/dL (ref 74–99)
Potassium: 4 mmol/L (ref 3.5–5.5)
Protein, total: 6.2 g/dL — ABNORMAL LOW (ref 6.4–8.2)
Sodium: 141 mmol/L (ref 136–145)

## 2014-09-04 LAB — LIPID PANEL
CHOL/HDL Ratio: 2.1 (ref 0–5.0)
Cholesterol, total: 131 MG/DL (ref <200)
HDL Cholesterol: 61 MG/DL — ABNORMAL HIGH (ref 40–60)
LDL, calculated: 63 MG/DL (ref 0–100)
Triglyceride: 35 MG/DL (ref <150)
VLDL, calculated: 7 MG/DL

## 2014-09-04 NOTE — Progress Notes (Signed)
PT DAILY TREATMENT NOTE - MCR 3-16    Patient Name: Cathy [name]  Date:09/04/2014  DOB: 1950-07-15    Patient DOB Verified  Payor: [place] MEDICARE / Plan: [place] MEDICARE PART A & B / Product Type: Medicare /    In time:1030  Out time: 1109  Total Treatment Time (min): 39  Total Timed Codes (min): 39  1:1 Treatment Time [facility] only):28   Visit #: 9 of 12    Treatment Area: Pain in right shoulder [M25.511]  Rotator cuff arthropathy, right [M12.811]    SUBJECTIVE  Pain Level (0-10 scale): 0/10  Any medication changes, allergies to medications, adverse drug reactions, diagnosis change, or new procedure performed?:  No     Yes (see summary sheet for update)  Subjective functional status/changes:    No changes reported  Pt reports she is improving.      OBJECTIVE  31 min Therapeutic Exercise:   See flow sheet :   Rationale: increase ROM and increase strength to improve the patient???s ability to improve ADL ease.    8 min Manual Therapy:  PROM all planes, GH mobs R shoulder   Rationale: decrease pain, increase ROM and increase tissue extensibility to improve overhead reaching.        With    TE    TA    neuro    other: Patient Education:  Review HEP     Progressed/Changed HEP based on:    positioning    body mechanics    transfers    heat/ice application     other:      Other Objective/Functional Measures:  No changes to therex,    Pain Level (0-10 scale) post treatment: 0/10    ASSESSMENT/Changes in Function:  Pt making gradual progress with capsular restrictions.     Patient will continue to benefit from skilled PT services to modify and progress therapeutic interventions, address functional mobility deficits, address ROM deficits, address strength deficits, analyze and address soft tissue restrictions, analyze and cue movement patterns, analyze and modify body mechanics/ergonomics, assess and modify postural abnormalities and instruct in home and community integration to attain remaining goals.       See Plan of Care     See progress note/recertification    See Discharge Summary         Progress towards goals / Updated goals:  Short Term Goals: To be accomplished in 2?? weeks:  ????????????????????????1. The patient will be independent and compliant with HEP to maximize therapeutic benefit., - Initiated HEP today. 08/17/2014  ????????????????????????2. The patient will improve shoulder flexion PROM to 115 degrees to improve overhead reaching. - flex PROM 121 degrees. 08/25/2014  Long Term Goals: To be accomplished in 4?? weeks:  ????????????????????????1. The patient will improve FOTO score to 63 to maximize quality of life. - 45%. 08/25/2014  ????????????????????????2. The patient will improve R shoulder AROM to 120 degrees to improve ease of overhead reaching. - flex AROM 101. 08/25/2014  ????????????????????????3. The patient will improve functional ER to C3 in order to better perform hair dressing. Met C7 08/29/2014  ????????????????????????4. The patient will improve functional IR to L5 in order to improve toileting efficiency. Not met - Iliac crest 08/29/2014.     PLAN    Upgrade activities as tolerated       Continue plan of care    Update interventions per flow sheet         Discharge due to:_    Other:_  Minta Balsam, PT 09/04/2014  10:46 AM

## 2014-09-06 ENCOUNTER — Inpatient Hospital Stay: Admit: 2014-09-06 | Payer: MEDICARE | Primary: Family Medicine

## 2014-09-06 NOTE — Progress Notes (Signed)
[facility] ??? [facility]  [place] [address]  [phone]  [phone] fax    Medicare Progress Report    Patient name: Cathy [name] Start of Care: 08/11/2014   Referral source: Thana Farr, MD DOB: [birth year]   Medical/Treatment Diagnosis: Pain in right shoulder [M25.511]  Rotator cuff arthropathy, right [M12.811] Onset Date:April 2016     Prior Hospitalization: see medical history Provider#: [id number]   Medications: Verified on Patient Summary List    Comorbidities: Heart Disease, HTN, Pacemaker   Prior Level of Function:The patient reports having full use of her shoulder prior to onset.  Visits from Start of Care: 10    Missed Visits: 0    Reporting Period: 08/11/2014 to 09/06/2014    Subjective Reports: The patient reports improvements regarding ROM and pain through R shoulder.     Current Status/ treatment goals Objective measures   ??Short Term Goals: To be accomplished in 2?? weeks:  ????????????????????????1. The patient will be independent and compliant with HEP to maximize therapeutic benefit., - Initiated HEP today. 08/17/2014  ????????????????????????2. The patient will improve shoulder flexion PROM to 115 degrees to improve overhead reaching. - flex PROM 121 degrees. 08/25/2014; Progressed to 145 degrees 09/06/2014. ??  Long Term Goals: To be accomplished in 4?? weeks:  ????????????????????????1. The patient will improve FOTO score to 63 to maximize quality of life. - 45%. 08/25/2014  ????????????????????????2. The patient will improve R shoulder AROM to 120 degrees to improve ease of overhead reaching. - flex AROM 101. 08/25/2014; Met 125 degrees R 09/06/2014  ????????????????????????3. The patient will improve functional ER to C3 in order to better perform hair dressing. Met C7 08/29/2014  ????????????????????????4. The patient will improve functional IR to L5 in order to improve toileting efficiency. Not met - Iliac crest 08/29/2014.?? Remains iliac crest 09/06/2014     Key functional changes:  The patient has demonstrated excellent improvements with GHJ mobility with largest deficits noted with IR currently. She will benefit from continued PT in order to address the deficits but is leaning towards D/C. It was mutually decided upon to leave to MD discretion regarding continuation or D/C.      Problems/ barriers to goal attainment: None     Assessment / Recommendations:See above    Problem List: pain affecting function, decrease ROM, decrease strength, decrease ADL/ functional abilitiies, decrease activity tolerance and decrease flexibility/ joint mobility   Treatment Plan: Therapeutic exercise, Therapeutic activities, Neuromuscular re-education, Physical agent/modality, Manual therapy and Patient education    Patient Goal (s) has been updated and includes: To see what the doctor says, maybe to increase ROM if he thinks I need it.     Updated Goals to be accomplished in 4  weeks:  1. The patient will improve FOTO score to 63 to maximize quality of life. - 45%. 08/25/2014  2. The patient will improve functional IR to L4 in order to maximize ease of dressing.  3. The patient will improve AROM to 130 degrees to improve ease of reaching into overhead cabinets.     Frequency / Duration: Patient to be seen 2 times per week for 4 weeks:    G-Codes (GP)    Carry  [id number] Current  CK= 40-59%   [id number] Goal  CJ= 20-39%     The severity rating is based on clinical judgment and the FOTO score.      Gerilyn Nestle, PT 09/06/2014 10:02 AM

## 2014-09-06 NOTE — Progress Notes (Signed)
PT DAILY TREATMENT NOTE - MCR 3-16    Patient Name: Cathy [name]  Date:09/06/2014  DOB: 05-04-1950    Patient DOB Verified  Payor: [place] MEDICARE / Plan: [place] MEDICARE PART A & B / Product Type: Medicare /    In time:9:10  Out time:9:40  Total Treatment Time (min): 30  Total Timed Codes (min): 30  1:1 Treatment Time ([place] only): 24   Visit #: 10 of 12    Treatment Area: Pain in right shoulder [M25.511]  Rotator cuff arthropathy, right [M12.811]    SUBJECTIVE  Pain Level (0-10 scale): 0/10  Any medication changes, allergies to medications, adverse drug reactions, diagnosis change, or new procedure performed?:  No     Yes (see summary sheet for update)  Subjective functional status/changes:    No changes reported  The patient reports subjective reports of improvement per strength.    OBJECTIVE    Modality rationale: decrease pain and increase tissue extensibility to improve the patient???s ability to improve ADL ease.    Min Type Additional Details     Estim:  Unatt       IFC  Premod                        Other:  w/ice   w/heat  Position:  Location:     Estim: Att    TENS instruct  NMES                    Other:  w/US   w/ice   w/heat  Position:  Location:      Traction:  Cervical       Lumbar                        Prone          Supine                       Intermittent   Continuous Lbs:   before manual   after manual      Ultrasound: Continuous    Pulsed                           1MHz   3MHz Location:  W/cm2:      Iontophoresis with dexamethasone         Location:  Take home patch    In clinic      Ice       heat    Ice massage    Laser     Anodyne Position:  Location:      Laser with stim    Other: Position:  Location:      Vasopneumatic Device Pressure:        lo  med  hi   Temperature:  lo  med  hi    Skin assessment post-treatment:  intact redness- no adverse reaction    redness ??? adverse reaction:     22 min Therapeutic Exercise:   See flow sheet :    Rationale: increase ROM and increase strength to improve the patient???s ability to improve ADL ease.     8 min Manual Therapy:  PROM - flexion, ABD, ER IR R shoulder   Rationale: decrease pain, increase ROM and increase tissue extensibility to improve ease.           With  TE    TA    neuro    other: Patient Education:  Review HEP     Progressed/Changed HEP based on:    positioning    body mechanics    transfers    heat/ice application     other:      Other Objective/Functional Measures:   PROM  Flexion: 145  ABD: 130  ER 75  IR 45    Functional ER: C7  Functional IR iliac crest     AROM: 125 degrees R     130 degrees L    Pain Level (0-10 scale) post treatment: 0/10    ASSESSMENT/Changes in Function: The patient has demonstrated excellent improvements with GHJ mobility with largest deficits noted with IR currently. She will benefit from continued PT in order to address the deficits but is leaning towards D/C. It was mutually decided upon to leave to MD discretion regarding continuation or D/C.     Patient will continue to benefit from skilled PT services to modify and progress therapeutic interventions, address functional mobility deficits, address ROM deficits, address strength deficits, analyze and address soft tissue restrictions, analyze and cue movement patterns, analyze and modify body mechanics/ergonomics, assess and modify postural abnormalities and instruct in home and community integration to attain remaining goals.       See Plan of Care    See progress note/recertification    See Discharge Summary         Progress towards goals / Updated goals:  Short Term Goals: To be accomplished in 2?? weeks:  ????????????????????????1. The patient will be independent and compliant with HEP to maximize therapeutic benefit., - Initiated HEP today. 08/17/2014  ????????????????????????2. The patient will improve shoulder flexion PROM to 115 degrees to improve overhead reaching. - flex PROM 121 degrees. 08/25/2014;  Progressed to 145 degrees 09/06/2014.   Long Term Goals: To be accomplished in 4?? weeks:  ????????????????????????1. The patient will improve FOTO score to 63 to maximize quality of life. - 45%. 08/25/2014  ????????????????????????2. The patient will improve R shoulder AROM to 120 degrees to improve ease of overhead reaching. - flex AROM 101. 08/25/2014; Met 125 degrees R 09/06/2014  ????????????????????????3. The patient will improve functional ER to C3 in order to better perform hair dressing. Met C7 08/29/2014  ????????????????????????4. The patient will improve functional IR to L5 in order to improve toileting efficiency. Not met - Iliac crest 08/29/2014.  Remains iliac crest 09/06/2014    PLAN    Upgrade activities as tolerated       Continue plan of care    Update interventions per flow sheet         Discharge due to:_    Other:_      Gerilyn Nestle, PT 09/06/2014  9:52 AM

## 2014-09-07 ENCOUNTER — Ambulatory Visit: Admit: 2014-09-07 | Discharge: 2014-09-07 | Payer: MEDICARE | Attending: Orthopaedic Surgery | Primary: Family Medicine

## 2014-09-07 DIAGNOSIS — M7501 Adhesive capsulitis of right shoulder: Secondary | ICD-10-CM

## 2014-09-07 MED ORDER — MELOXICAM 15 MG TAB
15 mg | ORAL_TABLET | Freq: Every day | ORAL | Status: DC
Start: 2014-09-07 — End: 2015-05-06

## 2014-09-07 NOTE — Patient Instructions (Signed)
Frozen Shoulder: Exercises  Your Care Instructions  Here are some examples of typical rehabilitation exercises for your condition. Start each exercise slowly. Ease off the exercise if you start to have pain.  Your doctor or physical therapist will tell you when you can start these exercises and which ones will work best for you.  How to do the exercises  Neck stretches    1. Look straight ahead, and tip your right ear to your right shoulder. Do not let your left shoulder rise up as you tip your head to the right.  2. Hold 15 to 30 seconds.  3. Tilt your head to the left. Do not let your right shoulder rise up as you tip your head to the left.  4. Hold for 15 to 30 seconds.  5. Repeat 2 to 4 times to each side.  Shoulder rolls    1. Sit comfortably with your feet shoulder-width apart. You can also do this exercise while standing.  2. Roll your shoulders up, then back, and then down in a smooth, circular motion.  3. Repeat 2 to 4 times.  Shoulder flexion (lying down)    Note: For this exercise, you will need a wand. To make a wand, use a piece of PVC pipe or a broom handle with the broom removed. Make the wand about a foot wider than your shoulders.  1. Lie on your back, holding a wand with your hands. Your palms should face down as you hold the wand. Place your hands slightly wider than your shoulders.  2. Keeping your elbows straight, slowly raise your arms over your head until you feel a stretch in your shoulders, upper back, and chest.  3. Hold 15 to 30 seconds.  4. Repeat 2 to 4 times.  Shoulder rotation (lying down)    Note: To make a wand, use a piece of PVC pipe or a broom handle with the broom removed. Make the wand about a foot wider than your shoulders.  1. Lie on your back and hold a wand in both hands with your elbows bent and your palms up.  2. Keeping your elbows close to your body, move the wand across your body toward the arm that has pain.  3. Hold for 15 to 30 seconds.  4. Repeat 2 to 4 times.   Shoulder internal rotation with towel    1. Roll up a towel lengthwise. Hold the towel above and behind your head with the arm that is not sore.  2. With your sore arm, reach behind your back and grasp the towel.  3. Using the arm above your head, pull the towel upward until you feel a stretch on the front and outside of your sore shoulder.  4. Hold 15 to 30 seconds.  5. Relax and move the towel back down to the starting position.  6. Repeat 2 to 4 times.  Shoulder blade squeeze    1. While standing with your arms at your sides, squeeze your shoulder blades together. Do not raise your shoulders up as you are squeezing.  2. Hold for 6 seconds.  3. Repeat 8 to 12 times.  Follow-up care is a key part of your treatment and safety. Be sure to make and go to all appointments, and call your doctor if you are having problems. It's also a good idea to know your test results and keep a list of the medicines you take.  Where can you learn more?  Go to http://www.healthwise.net/[employer]    Enter R144 in the search box to learn more about "Frozen Shoulder: Exercises."  ?? 2006-2016 [employer]. Care instructions adapted under license by [employer] (which disclaims liability or warranty for this information). This care instruction is for use with your licensed healthcare professional. If you have questions about a medical condition or this instruction, always ask your healthcare professional. [employer], Inc[employer] disclaims any warranty or liability for your use of this information.  Content Version: 10.9.538570; Current as of: Jul 15, 2013

## 2014-09-07 NOTE — Progress Notes (Signed)
Cathy [name]  1950/03/30   Chief Complaint   Patient presents with   ??? Shoulder Pain     right        HISTORY OF PRESENT ILLNESS  Cathy [name] is a 64 y.o. female who presents today for reevaluation of R shoulder pain. Pt received a cortisone injection last office visit and has been attending PT as directed. She has completed 14 sessions and reports improved ROM but is still having pain.  Her pain level started at a 9/10 and is down to 4/10.  She takes Aleve PM at night to help with the pain.     Allergies   Allergen Reactions   ??? Tramadol Other (comments)     Psychotic Hallucinations   ??? Pcn [Penicillins] Itching     Itching bumps on hands and arms        Past Medical History   Diagnosis Date   ??? CHF (congestive heart failure) ([place]) 06/23/2008     Non-ischemic CMP , Cath (2008) Normal coronary arteries   ??? 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 Fibroid 06/23/2008   ??? HTN (hypertension) 06/23/2008   ??? HLD (hyperlipidemia)    ??? Breast mass, left      benign   ??? OSA (obstructive sleep apnea) 9/10   ??? Chronic systolic heart failure (HCC)      Stable,    ??? Automatic implantable cardiac defibrillator in situ      Post ddd icd Set up carelink   ??? Hypotension, unspecified      Related to lisinopril   ??? Venous reflux    ??? Left knee pain      With swelling   ??? Wears glasses    ??? Leg pain    ??? Degenerative arthritis of left knee    ??? 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 Marital Status: MARRIED     Spouse Name: N/A   ??? Number of Children: N/A   ??? 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 Smoking status: Never Smoker    ??? Smokeless tobacco: Never Used   ??? Alcohol Use: No   ??? Drug Use: No   ??? Sexual Activity:     Partners: Male     Patent examiner Protection: Condom     Other Topics Concern   ??? Not on file     Social History Narrative       Past Surgical History   Procedure Laterality Date   ??? Hx tonsil and adenoidectomy     ??? Hx tubal ligation     ??? Hx total abdominal hysterectomy  1996   ??? Hx cholecystectomy     ??? Hx pacemaker       dual chamber icd   ??? Hx craniotomy  2013     meningioma    ??? Hx breast biopsy       rt breast, benign   ??? Hx heent       glasses      Family History   Problem Relation Age of Onset   ??? Diabetes Mother    ??? Heart Attack Father      MI   ??? Heart Disease  Maternal Grandmother    ??? Heart Disease Paternal Grandmother    ??? Kidney Disease Other      1 sib deceased   ??? Cancer Other      1 sib throat cancer   ??? Diabetes Other    ??? Diabetes Daughter    ??? Other Daughter      leukemia   ??? Hypertension Other       Current Outpatient Prescriptions   Medication Sig   ??? meloxicam (MOBIC) 15 mg tablet Take 1 Tab by mouth daily (with breakfast).   ??? inhalational spacing device ALWAYS USE WITH INHALER   ??? lisinopril (PRINIVIL, ZESTRIL) 5 mg tablet TAKE ONE TABLET BY MOUTH DAILY   ??? furosemide (LASIX) 20 mg tablet TAKE ONE TABLET BY MOUTH DAILY   ??? carvedilol (COREG) 3.125 mg tablet TAKE ONE TABLET BY MOUTH TWICE A DAY   ??? rosuvastatin (CRESTOR) 5 mg tablet TAKE ONE TABLET BY MOUTH IN THE EVENING   ??? chlorpheniramine-HYDROcodone (TUSSIONEX) 10-8 mg/5 mL suspension Take 5 mL by mouth every twelve (12) hours as needed for Cough. Max Daily Amount: 10 mL.   ??? albuterol (PROVENTIL HFA, VENTOLIN HFA, PROAIR HFA) 90 mcg/actuation inhaler Take 2 Puffs by inhalation every four (4) hours as needed for Wheezing or Shortness of Breath (Cough).     No current facility-administered medications for this visit.       REVIEW OF SYSTEM   Patient denies: Weight loss, Fever/Chills, HA, Visual changes, Fatigue, Chest pain, SOB, Abdominal pain, N/V/D/C, Blood in stool or urine, Edema.   Pertinent positive as above in HPI. All others were negative    PHYSICAL EXAM:   BP 104/70 mmHg   Pulse 74   Temp(Src) 95.8 ??F (35.4 ??C)   Ht 5\' 9"  (1.753  m)   Wt 198 lb (89.812 kg)   BMI 29.23 kg/m2  The patient is a well-developed, well-nourished female   in no acute distress.  The patient is alert and oriented times three.  The patient is alert and oriented times three. Mood and affect are normal.  LYMPHATIC: lymph nodes are not enlarged and are within normal limits  SKIN: normal in color and non tender to palpation. There are no bruises or abrasions noted.   NEUROLOGICAL: Motor sensory exam is within normal limits. Reflexes are equal bilaterally. There is normal sensation to pinprick and light touch  MUSCULOSKELETAL:  Examination Right shoulder   Skin Intact   AC joint tenderness -   Biceps tenderness -   Forward flexion/Elevation ROM 120   Active abduction ROM 120   Glenohumeral abduction 80   External rotation ROM 90   Internal rotation ROM 70   Apprehension -   Jobe???s Relocation -   Jerk -   Load and Shift -   O???briens -   Speeds -   Impingement sign -   Supraspinatus/Empty Can -, 5/5   External Rotation Strength -, 5/5   Lift Off/Belly Press -, 5/5   Neurovascular Intact        IMPRESSION:      ICD-10-CM ICD-9-CM    1. 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 She will continue PT.   2. She will take Mobic 15 mg as directed.   3. She will follow up in about one month.     Follow-up Disposition: Not on File  Scribed by Karn Pickler ([place]) as dictated by Richardo Hanks, MD    Richardo Hanks, M.D.   [facility]

## 2014-09-07 NOTE — Addendum Note (Signed)
Addended by: Luetta Nutting on: 09/07/2014 04:17 PM      Modules accepted: Level of Service

## 2014-09-11 ENCOUNTER — Encounter: Primary: Family Medicine

## 2014-09-11 ENCOUNTER — Ambulatory Visit: Admit: 2014-09-11 | Discharge: 2014-09-11 | Payer: MEDICARE | Attending: Internal Medicine | Primary: Family Medicine

## 2014-09-11 DIAGNOSIS — E7800 Pure hypercholesterolemia, unspecified: Secondary | ICD-10-CM

## 2014-09-11 NOTE — Patient Instructions (Signed)
DASH Diet: Care Instructions  Your Care Instructions  The DASH diet is an eating plan that can help lower your blood pressure. DASH stands for Dietary Approaches to Stop Hypertension. Hypertension is high blood pressure.  The DASH diet focuses on eating foods that are high in calcium, potassium, and magnesium. These nutrients can lower blood pressure. The foods that are highest in these nutrients are fruits, vegetables, low-fat dairy products, nuts, seeds, and legumes. But taking calcium, potassium, and magnesium supplements instead of eating foods that are high in those nutrients does not have the same effect. The DASH diet also includes whole grains, fish, and poultry.  The DASH diet is one of several lifestyle changes your doctor may recommend to lower your high blood pressure. Your doctor may also want you to decrease the amount of sodium in your diet. Lowering sodium while following the DASH diet can lower blood pressure even further than just the DASH diet alone.  Follow-up care is a key part of your treatment and safety. Be sure to make and go to all appointments, and call your doctor if you are having problems. It's also a good idea to know your test results and keep a list of the medicines you take.  How can you care for yourself at home?  Following the DASH diet  ?? Eat 4 to 5 servings of fruit each day. A serving is 1 medium-sized piece of fruit, ?? cup chopped or canned fruit, 1/4 cup dried fruit, or 4 ounces (?? cup) of fruit juice. Choose fruit more often than fruit juice.  ?? Eat 4 to 5 servings of vegetables each day. A serving is 1 cup of lettuce or raw leafy vegetables, ?? cup of chopped or cooked vegetables, or 4 ounces (?? cup) of vegetable juice. Choose vegetables more often than vegetable juice.  ?? Get 2 to 3 servings of low-fat and fat-free dairy each day. A serving is 8 ounces of milk, 1 cup of yogurt, or 1 ?? ounces of cheese.   ?? Eat 6 to 8 servings of grains each day. A serving is 1 slice of bread, 1 ounce of dry cereal, or ?? cup of cooked rice, pasta, or cooked cereal. Try to choose whole-grain products as much as possible.  ?? Limit lean meat, poultry, and fish to 2 servings each day. A serving is 3 ounces, about the size of a deck of cards.  ?? Eat 4 to 5 servings of nuts, seeds, and legumes (cooked dried beans, lentils, and split peas) each week. A serving is 1/3 cup of nuts, 2 tablespoons of seeds, or ?? cup of cooked beans or peas.  ?? Limit fats and oils to 2 to 3 servings each day. A serving is 1 teaspoon of vegetable oil or 2 tablespoons of salad dressing.  ?? Limit sweets and added sugars to 5 servings or less a week. A serving is 1 tablespoon jelly or jam, ?? cup sorbet, or 1 cup of lemonade.  ?? Eat less than 2,300 milligrams (mg) of sodium a day. If you have high blood pressure, diabetes, or chronic kidney disease, if you are African-American, or if you are older than age 50, try to limit the amount of sodium you eat to less than 1,500 mg a day.  Tips for success  ?? Start small. Do not try to make dramatic changes to your diet all at once. You might feel that you are missing out on your favorite foods and then be more likely   to not follow the plan. Make small changes, and stick with them. Once those changes become habit, add a few more changes.  ?? Try some of the following:  ?? Make it a goal to eat a fruit or vegetable at every meal and at snacks. This will make it easy to get the recommended amount of fruits and vegetables each day.  ?? Try yogurt topped with fruit and nuts for a snack or healthy dessert.  ?? Add lettuce, tomato, cucumber, and onion to sandwiches.  ?? Combine a ready-made pizza crust with low-fat mozzarella cheese and lots of vegetable toppings. Try using tomatoes, squash, spinach, broccoli, carrots, cauliflower, and onions.  ?? Have a variety of cut-up vegetables with a low-fat dip as an appetizer  instead of chips and dip.  ?? Sprinkle sunflower seeds or chopped almonds over salads. Or try adding chopped walnuts or almonds to cooked vegetables.  ?? Try some vegetarian meals using beans and peas. Add garbanzo or kidney beans to salads. Make burritos and tacos with mashed pinto beans or black beans.  Where can you learn more?  Go to http://www.healthwise.net/GoodHelp[employer]ctions  Enter H967 in the search box to learn more about "DASH Diet: Care Instructions."  ?? 2006-2016 Healthwise[employer]. Care instructions adapted under license by [employer] (which disclaims liability or warranty for this information). This care instruction is for use with your licensed healthcare professional. If you have questions about a medical condition or this instruction, always ask your healthcare professional. Healthwise, Incorporated disclaims any warranty or liability for your use of this information.  Content Version: 10.9.538570; Current as of: March 22, 2014

## 2014-09-11 NOTE — Progress Notes (Signed)
Subjective:       Chief Complaint  The patient presents for follow up of hypertension and high cholesterol. Cardiomyopathy         HPI  Cathy [name] is a 64 y.o. female seen for follow up of hyperlipidemia.  Shealso has hypertension. Hypertension well controlled, no significant medication side effects noted, on Coreg and lisinopril, her SBP goes below 100 at times and she has been told by cardiology to hold lisinopril at that time, she had recent syncopal episode due to hypotension, hyperlipidemia well controlled, no significant medication side effects noted, on Crestor. Pt has had difficulty tolerating other statins in the past    Diet and Lifestyle: generally follows a low fat low cholesterol diet    Home BP Monitoring: is not measured at home.    Other symptoms and concerns: Cardiovascular Review:  The patient has CHF/Cardiomyopathy   Diet and Lifestyle: generally follows a low fat low cholesterol diet, exercises regularly   Home BP Monitoring: is not measured at home.  Pertinent ROS: taking medications as instructed, no medication side effects noted, no TIA's, no chest pain on exertion, no dyspnea on exertion, no swelling of ankles.     Vit D deficiency is mild. Pt currently not on any supplementation.      Discussed the patient's BMI with her.  The BMI follow up plan is as follows: BMI is out of normal parameters and plan is as follows: I have counseled this patient on diet and exercise regimens.      Current Outpatient Prescriptions   Medication Sig   ??? meloxicam (MOBIC) 15 mg tablet Take 1 Tab by mouth daily (with breakfast).   ??? albuterol (PROVENTIL HFA, VENTOLIN HFA, PROAIR HFA) 90 mcg/actuation inhaler Take 2 Puffs by inhalation every four (4) hours as needed for Wheezing or Shortness of Breath (Cough).   ??? inhalational spacing device ALWAYS USE WITH INHALER   ??? lisinopril (PRINIVIL, ZESTRIL) 5 mg tablet TAKE ONE TABLET BY MOUTH DAILY    ??? furosemide (LASIX) 20 mg tablet TAKE ONE TABLET BY MOUTH DAILY   ??? carvedilol (COREG) 3.125 mg tablet TAKE ONE TABLET BY MOUTH TWICE A DAY   ??? rosuvastatin (CRESTOR) 5 mg tablet TAKE ONE TABLET BY MOUTH IN THE EVENING   ??? chlorpheniramine-HYDROcodone (TUSSIONEX) 10-8 mg/5 mL suspension Take 5 mL by mouth every twelve (12) hours as needed for Cough. Max Daily Amount: 10 mL.     No current facility-administered medications for this visit.             Review of Systems  Respiratory: negative for dyspnea on exertion  Cardiovascular: negative for chest pain    Objective:     BP 100/60 mmHg   Pulse 78   Temp(Src) 98.2 ??F (36.8 ??C) (Oral)   Resp 18   Ht 5' 9" (1.753 m)   Wt 188 lb (85.276 kg)   BMI 27.75 kg/m2   SpO2 99%     General appearance - alert, well appearing, and in no distress  Neck - supple, no significant adenopathy  Chest - clear to auscultation, no wheezes, rales or rhonchi, symmetric air entry  Heart - normal rate, regular rhythm, normal S1, S2, no murmurs, rubs, clicks or gallops  Extremities - peripheral pulses normal, no pedal edema, no clubbing or cyanosis  Skin - normal coloration and turgor, no rashes, no suspicious skin lesions noted      Labs:   Lab Results   Component Value Date/Time  CHOLESTEROL, TOTAL 131 09/04/2014 08:56 AM    HDL CHOLESTEROL 61 09/04/2014 08:56 AM    LDL, CALCULATED 63 09/04/2014 08:56 AM    TRIGLYCERIDE 35 09/04/2014 08:56 AM    CHOL/HDL RATIO 2.1 09/04/2014 08:56 AM     Lab Results   Component Value Date/Time    ALT 21 09/04/2014 08:56 AM    AST 9 09/04/2014 08:56 AM    ALK. PHOSPHATASE 78 09/04/2014 08:56 AM    BILIRUBIN, DIRECT 0.1 06/07/2008 10:46 AM    BILIRUBIN, TOTAL 0.2 09/04/2014 08:56 AM     Lab Results   Component Value Date/Time    GFR EST AA >60 09/04/2014 08:56 AM    GFR EST NON-AA >60 09/04/2014 08:56 AM    CREATININE (POC) 0.9 07/21/2012 08:08 AM    CREATININE 0.69 09/04/2014 08:56 AM    BUN 15 09/04/2014 08:56 AM    SODIUM 141 09/04/2014 08:56 AM     POTASSIUM 4.0 09/04/2014 08:56 AM    CHLORIDE 108 09/04/2014 08:56 AM    CO2 26 09/04/2014 08:56 AM            Assessment / Plan     Hypertension well controlled, on Coreg and lisinopril. consider decreasing Lisinopril to 2.5 mg if she has to miss the 5 mg dose > 3x/month   Hyperlipidemia reasonably well controlled, on Crestor      ICD-10-CM ICD-9-CM    1. High cholesterol E78.0 272.0 LIPID PANEL   2. Hypotension, unspecified H40.3 524.8 METABOLIC PANEL, COMPREHENSIVE   3. Cardiomyopathy ([place]) I42.9 425.4 Well controlled on current meds. Pt followed by cardiology.                  Low cholesterol diet, weight control and daily exercise discussed.     Follow-up Disposition:  Return in about 7 months (around 04/14/2015) for OV, and Medicare Wellness Visit, labs 1 week before.      Reviewed plan of care. Patient has provided input and agrees with goals.

## 2014-09-11 NOTE — Progress Notes (Signed)
Patient is in the office today for a 6 month follow up.    Do you have an Advance Directive no  Do you want more information: information previously given     1. Have you been to the ER, urgent care clinic since your last visit?  Hospitalized since your last visit?yes, [facility].     2. Have you seen or consulted any other health care providers outside of the [facility] since your last visit?  Include any pap smears or colon screening. No

## 2014-09-12 ENCOUNTER — Institutional Professional Consult (permissible substitution): Admit: 2014-09-12 | Discharge: 2014-09-12 | Payer: MEDICARE | Primary: Family Medicine

## 2014-09-12 DIAGNOSIS — R55 Syncope and collapse: Secondary | ICD-10-CM

## 2014-09-12 NOTE — Progress Notes (Signed)
Echocardiogram completed. Doctor will review results.    Taitum Menton, R.C.S.   [facility]

## 2014-09-16 MED ORDER — CRESTOR 5 MG TABLET
5 mg | ORAL_TABLET | ORAL | Status: DC
Start: 2014-09-16 — End: 2015-06-14

## 2014-09-18 ENCOUNTER — Encounter

## 2014-09-18 NOTE — Telephone Encounter (Signed)
On review of record pt did not have f/u diagnostic right mammogram.  Pt was contacted and says she forgot to schedule. Will place order for bilateral diagnostic mammogram since pt is also overdue for normal screening.

## 2014-09-19 NOTE — Progress Notes (Signed)
[facility] ??? [facility]  [address] [place] [address]  [phone]  [phone] fax    Physical Therapy Discharge Summary    Patient name: Cathy [name] Start of Care: 08/11/2014   Referral source: Thana Farr, MD DOB: October 16, 1950   Medical/Treatment Diagnosis: Pain in right shoulder [M25.511]  Rotator cuff arthropathy, right [M12.811] Onset Date:April 2016     Prior Hospitalization: see medical history Provider#: [id number]   Medications: Verified on Patient Summary List    Comorbidities: Heart Disease, HTN, Pacemaker   Prior Level of Function:The patient reports having full use of her shoulder prior to onset.  Visits from Start of Care: 10    Missed Visits: 1  Reporting Period : 09/06/2014 to 10/07/2014    Summary of Care:  No change since last progress note written. The patient opted to no longer continue PT. She had gains as listed per previous progress note:    "The patient has demonstrated excellent improvements with GHJ mobility with largest deficits noted with IR currently. She will benefit from continued PT in order to address the deficits but is leaning towards D/C. It was mutually decided upon to leave to MD discretion regarding continuation or D/C."    G-Codes (GP)  Carry   [phone] Goal  CJ= 20-39%  [id number] D/C  CK= 40-59%    The severity rating is based on clinical judgment and the FOTO score.    ASSESSMENT/RECOMMENDATIONS:  Discontinue therapy: Patient has reached or is progressing toward set goals      Patient is non-compliant or has abdicated      Due to lack of appreciable progress towards set [place], PT 09/19/2014 7:54 AM

## 2014-09-26 ENCOUNTER — Inpatient Hospital Stay: Admit: 2014-09-26 | Payer: MEDICARE | Attending: Internal Medicine | Primary: Family Medicine

## 2014-09-26 DIAGNOSIS — R921 Mammographic calcification found on diagnostic imaging of breast: Secondary | ICD-10-CM

## 2014-10-16 MED ORDER — CARVEDILOL 3.125 MG TAB
3.125 mg | ORAL_TABLET | Freq: Two times a day (BID) | ORAL | 6 refills | Status: DC
Start: 2014-10-16 — End: 2015-05-14

## 2014-10-16 MED ORDER — LISINOPRIL 5 MG TAB
5 mg | ORAL_TABLET | Freq: Every day | ORAL | 6 refills | Status: DC
Start: 2014-10-16 — End: 2015-05-14

## 2014-10-16 MED ORDER — FUROSEMIDE 20 MG TAB
20 mg | ORAL_TABLET | Freq: Every day | ORAL | 6 refills | Status: DC
Start: 2014-10-16 — End: 2015-05-14

## 2014-10-16 NOTE — Progress Notes (Signed)
error 

## 2014-10-26 ENCOUNTER — Ambulatory Visit: Admit: 2014-10-26 | Discharge: 2014-10-26 | Payer: MEDICARE | Attending: Specialist | Primary: Family Medicine

## 2014-10-26 DIAGNOSIS — I5022 Chronic systolic (congestive) heart failure: Secondary | ICD-10-CM

## 2014-10-26 NOTE — Progress Notes (Signed)
1. Have you been to the ER, urgent care clinic since your last visit?  Hospitalized since your last visit?     No    2. Have you seen or consulted any other health care providers outside of the [facility] since your last visit?  Include any pap smears or colon screening.      Yes Where: pcp     3.  Since your last visit, have you had any of the following symptoms?      chest pains and shortness of breath.

## 2014-10-26 NOTE — Progress Notes (Signed)
HISTORY OF PRESENT ILLNESS  Cathy [name] is a 64 y.o. female.    HPI Comments: Patient with chf,cmp,mr syncope  Happened while eating at the restaurant-icd eval-no significant arrythmia  No recurrence  Patient is here for follow up of diagnostic tests.Results will be discussed.      CHF   The history is provided by the patient. This is a chronic problem. The problem occurs constantly. The problem has not changed since onset.Associated symptoms include shortness of breath. Pertinent negatives include no chest pain. The symptoms are aggravated by exertion.   Shortness of Breath   The history is provided by the patient. This is a recurrent problem. The problem occurs frequently.The problem has not changed since onset.Pertinent negatives include no fever, no cough, no sputum production, no hemoptysis, no wheezing, no PND, no orthopnea, no chest pain, no vomiting, no rash, no leg swelling and no claudication.       Review of Systems   Constitutional: Negative for chills and fever.   HENT: Negative for nosebleeds.    Eyes: Negative for blurred vision and double vision.   Respiratory: Positive for shortness of breath. Negative for cough, hemoptysis, sputum production and wheezing.    Cardiovascular: Negative for chest pain, palpitations, orthopnea, claudication, leg swelling and PND.   Gastrointestinal: Negative for heartburn, nausea and vomiting.   Musculoskeletal: Negative for myalgias.   Skin: Negative for rash.   Neurological: Positive for loss of consciousness. Negative for dizziness and weakness.   Endo/Heme/Allergies: Does not bruise/bleed easily.     Family History   Problem Relation Age of Onset   ??? Diabetes Mother    ??? Heart Attack Father      MI   ??? Heart Disease Maternal Grandmother    ??? Heart Disease Paternal Grandmother    ??? Kidney Disease Other      1 sib deceased   ??? Cancer Other      1 sib throat cancer   ??? Diabetes Other    ??? Diabetes Daughter    ??? Other Daughter      leukemia    ??? Hypertension Other        Past Medical History   Diagnosis Date   ??? Automatic implantable cardiac defibrillator in situ      Post ddd icd Set up [facility]   ??? Breast mass, left      benign   ??? CHF (congestive heart failure) ([place]) 06/23/2008     Non-ischemic CMP , Cath (2008) Normal coronary arteries   ??? Chronic systolic heart failure (HCC)      Stable,    ??? Degenerative arthritis of left knee    ??? Fibroid 06/23/2008   ??? HLD (hyperlipidemia)    ??? HTN (hypertension) 06/23/2008   ??? Hypotension, unspecified      Related to lisinopril   ??? 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 Left knee pain      With swelling   ??? Leg pain    ??? Mitral valve disorders 04/11/2013     mod to severe mr    ??? OSA (obstructive sleep apnea) 9/10   ??? Venous reflux    ??? Wears glasses        Past Surgical History   Procedure Laterality Date   ??? Hx tonsil and adenoidectomy     ??? Hx tubal ligation     ??? Hx total abdominal hysterectomy  1996   ???  Hx cholecystectomy     ??? Hx pacemaker       dual chamber icd   ??? Hx craniotomy  2013     meningioma    ??? Hx breast biopsy       rt breast, benign   ??? Hx heent       glasses       Allergies   Allergen Reactions   ??? Tramadol Other (comments)     Psychotic Hallucinations   ??? Pcn [Penicillins] Itching     Itching bumps on hands and arms       Current Outpatient Prescriptions   Medication Sig   ??? lisinopril (PRINIVIL, ZESTRIL) 5 mg tablet Take 1 Tab by mouth daily.   ??? carvedilol (COREG) 3.125 mg tablet Take 1 Tab by mouth two (2) times a day.   ??? furosemide (LASIX) 20 mg tablet Take 1 Tab by mouth daily.   ??? CRESTOR 5 mg tablet TAKE ONE TABLET BY MOUTH EVERY EVENING   ??? meloxicam (MOBIC) 15 mg tablet Take 1 Tab by mouth daily (with breakfast).   ??? chlorpheniramine-HYDROcodone (TUSSIONEX) 10-8 mg/5 mL suspension Take 5 mL by mouth every twelve (12) hours as needed for Cough. Max Daily Amount: 10 mL.   ??? albuterol (PROVENTIL HFA, VENTOLIN HFA, PROAIR HFA) 90 mcg/actuation  inhaler Take 2 Puffs by inhalation every four (4) hours as needed for Wheezing or Shortness of Breath (Cough).   ??? inhalational spacing device ALWAYS USE WITH INHALER     No current facility-administered medications for this visit.        Visit Vitals   ??? BP 124/66   ??? Pulse 76   ??? Ht 5\' 9"  (1.753 m)   ??? Wt 85.7 kg (189 lb)   ??? BMI 27.91 kg/m2         Physical Exam   Constitutional: She is oriented to person, place, and time. She appears well-developed and well-nourished.   HENT:   Head: Normocephalic and atraumatic.   Eyes: Conjunctivae are normal.   Neck: Neck supple. No JVD present. No tracheal deviation present. No thyromegaly present.   Cardiovascular: Normal rate and regular rhythm.  PMI is not displaced.  Exam reveals no gallop, no S3 and no decreased pulses.    Murmur heard.   Holosystolic murmur is present with a grade of 2/6  at the lower left sternal border, apex  Pulmonary/Chest: No respiratory distress. She has no wheezes. She has no rales. She exhibits no tenderness.   Abdominal: Soft. There is no tenderness.   Musculoskeletal: She exhibits no edema.   Neurological: She is alert and oriented to person, place, and time.   Skin: Skin is warm.   Psychiatric: She has a normal mood and affect.     CARDIOLOGY STUDIES 08/25/2010 10/25/2008 07/25/2008 01/25/2008 02/24/2006   Myocardial Perfusion Scan Result - - FIXED INF AND REVERSIBLE ANT APICAL DEFECT - -   Cardiac Cath Result - - - - EF 25%, NO SIGNIFICANT CAD   Echocardiogram - Complete Result DILATED 20%, MILD MR - - EF 25% -   Doppler US Arterial Result - NL SCAN - - -           Cathy [name] has a reminder for a "due or due soon" health maintenance. I have asked that she contact her primary care provider for follow-up on this health maintenance.    SUMMARY:echo:01/2013  Left ventricle: The ventricle was moderately to severely dilated. Systolic  function was moderately  to markedly reduced. Ejection fraction was   estimated in the range of 20 % to 25 %. There was severe diffuse  hypokinesis. Features were consistent with a pseudonormal left ventricular  filling pattern, with concomitant abnormal relaxation and increased  filling pressure (grade 2 diastolic dysfunction). Intracavitary echoes  most suggestive of apical trabeculations were present.    Left atrium: The atrium was severely dilated. LA volume index was 91  ml/m squared.    Right atrium: The atrium was mildly dilated.    Mitral valve: There was mild annular calcification. There was mild diffuse  thickening of the anterior and posterior leaflets. In certain images the  leaflet tips exhibit minimally reduced coaptation. There was moderate to  severe regurgitation. Mean transmitral gradient was 2.7 mmHg. The  effective orifice of mitral regurgitation by proximal isovelocity surface  area was 0.22 cm squared. The volume of mitral regurgitation by proximal  isovelocity surface area was 39 ml.    Tricuspid valve: There was mild regurgitation. Pulmonary artery systolic  pressure: 56 mmHg.   SUMMARY:echo-08/2014  Left ventricle: The ventricle was moderately dilated. Systolic function  was severely reduced. Ejection fraction was estimated to be 20 %. There  was severe diffuse hypokinesis. Doppler parameters were consistent with  abnormal left ventricular relaxation (grade 1 diastolic dysfunction).    Left atrium: The atrium was severely dilated.    Atrial septum: There was no evidence of intracardiac shunt by  peripherally-administered agitated saline contrast.    Right atrium: There was a possible, small, mobile mass associated with the  pacing wire.    Mitral valve: There was mild annular calcification. There was mild diffuse  thickening of the anterior and posterior leaflets. There was moderate  regurgitation. The effective orifice of mitral regurgitation by proximal  isovelocity surface area was 0.1 cm??????. The volume of mitral regurgitation   by proximal isovelocity surface area was 27 ml.    Tricuspid valve: There was mild regurgitation. Pulmonary artery systolic  pressure: 30 mmHg.  Assessment       ICD-10-CM ICD-9-CM    1. Chronic systolic heart failure (HCC) I50.22 428.22     exertional sob  nyha class3   2. Non-rheumatic mitral regurgitation I34.0 424.0     moderate mr     3. High cholesterol E78.0 272.0     on stable   4. Cardiomyopathy ([place]) I42.9 425.4     ef 20%   5. Essential hypertension with goal blood pressure less than 140/90 I10 401.9     periods of low bp stable     6. AICD (automatic cardioverter/defibrillator) present Z95.810 V45.02      On low dose coreg and lisinopril as Patient had hypotension in past with higher dose  There are no discontinued medications.    No orders of the defined types were placed in this encounter.      Follow-up Disposition:  Return in about 6 months (around 04/25/2015).

## 2014-11-07 ENCOUNTER — Ambulatory Visit: Admit: 2014-11-07 | Discharge: 2014-11-07 | Payer: MEDICARE | Attending: Internal Medicine | Primary: Family Medicine

## 2014-11-07 DIAGNOSIS — R5381 Other malaise: Secondary | ICD-10-CM

## 2014-11-07 DIAGNOSIS — J40 Bronchitis, not specified as acute or chronic: Secondary | ICD-10-CM

## 2014-11-07 MED ORDER — FLUTICASONE 50 MCG/ACTUATION NASAL SPRAY, SUSP
50 mcg/actuation | Freq: Every day | NASAL | 5 refills | Status: DC
Start: 2014-11-07 — End: 2018-01-28

## 2014-11-07 MED ORDER — AZITHROMYCIN 250 MG TAB
250 mg | ORAL_TABLET | ORAL | 0 refills | Status: AC
Start: 2014-11-07 — End: 2014-11-12

## 2014-11-07 NOTE — Progress Notes (Signed)
HISTORY OF PRESENT ILLNESS  Cathy [name] is a 64 y.o. female.  Cold Symptoms   The history is provided by the patient. This is a new problem. The current episode started more than 1 week ago. The problem occurs constantly. The cough is non-productive. Associated symptoms include headaches (these are new for pt recently. She has h//o memingioma in the last 5 yrs) and nausea. Pertinent negatives include no chest pain and no shortness of breath. She has tried nothing for the symptoms. The treatment provided no relief.     Current Outpatient Prescriptions   Medication Sig   ??? fluticasone (FLONASE) 50 mcg/actuation nasal spray 2 Sprays by Both Nostrils route daily.   ??? azithromycin (ZITHROMAX) 250 mg tablet Take 2 tablets today, then take 1 tablet daily   ??? lisinopril (PRINIVIL, ZESTRIL) 5 mg tablet Take 1 Tab by mouth daily.   ??? carvedilol (COREG) 3.125 mg tablet Take 1 Tab by mouth two (2) times a day.   ??? furosemide (LASIX) 20 mg tablet Take 1 Tab by mouth daily.   ??? CRESTOR 5 mg tablet TAKE ONE TABLET BY MOUTH EVERY EVENING   ??? chlorpheniramine-HYDROcodone (TUSSIONEX) 10-8 mg/5 mL suspension Take 5 mL by mouth every twelve (12) hours as needed for Cough. Max Daily Amount: 10 mL.   ??? inhalational spacing device ALWAYS USE WITH INHALER   ??? meloxicam (MOBIC) 15 mg tablet Take 1 Tab by mouth daily (with breakfast).   ??? albuterol (PROVENTIL HFA, VENTOLIN HFA, PROAIR HFA) 90 mcg/actuation inhaler Take 2 Puffs by inhalation every four (4) hours as needed for Wheezing or Shortness of Breath (Cough).     No current facility-administered medications for this visit.          Review of Systems   Constitutional: Positive for malaise/fatigue.   Respiratory: Negative for shortness of breath.    Cardiovascular: Negative for chest pain.   Gastrointestinal: Positive for nausea.   Neurological: Positive for headaches (these are new for pt recently. She has h//o memingioma in the last 5 yrs).       Physical Exam    Constitutional: She appears well-developed and well-nourished.   HENT:   Wax impacted in both ears    Cardiovascular: Normal rate, regular rhythm and normal heart sounds.    Pulmonary/Chest: Effort normal and breath sounds normal.   Vitals reviewed.      ASSESSMENT and PLAN    ICD-10-CM ICD-9-CM    1. Bronchitis J40 490 Will treat with azithromycin (ZITHROMAX) 250 mg tablet   2. Nausea R11.0 787.02 CT HEAD WO CONT   3. Malaise and fatigue R53.81 161.09 Check METABOLIC PANEL, COMPREHENSIVE    R53.83  CBC WITH AUTOMATED DIFF   4. Primary stabbing headache G44.85 339.85 CT HEAD WO CONT with h/o meningioma       Reviewed plan of care. Patient has provided input and agrees with goals.

## 2014-11-07 NOTE — Patient Instructions (Signed)
Cough: Care Instructions  Your Care Instructions  A cough is your body's response to something that bothers your throat or airways. Many things can cause a cough. You might cough because of a cold or the flu, bronchitis, or asthma. Smoking, postnasal drip, allergies, and stomach acid that backs up into your throat also can cause coughs.  A cough is a symptom, not a disease. Most coughs stop when the cause, such as a cold, goes away. You can take a few steps at home to cough less and feel better.  Follow-up care is a key part of your treatment and safety. Be sure to make and go to all appointments, and call your doctor if you are having problems. It's also a good idea to know your test results and keep a list of the medicines you take.  How can you care for yourself at home?  ?? Drink lots of water and other fluids. This helps thin the mucus and soothes a dry or sore throat. Honey or lemon juice in hot water or tea may ease a dry cough.  ?? Take cough medicine as directed by your doctor.  ?? Prop up your head on pillows to help you breathe and ease a dry cough.  ?? Try cough drops to soothe a dry or sore throat. Cough drops don't stop a cough. Medicine-flavored cough drops are no better than candy-flavored drops or hard candy.  ?? Do not smoke. Avoid secondhand smoke. If you need help quitting, talk to your doctor about stop-smoking programs and medicines. These can increase your chances of quitting for good.  When should you call for help?  Call 911 anytime you think you may need emergency care. For example, call if:  ?? You have severe trouble breathing.  Call your doctor now or seek immediate medical care if:  ?? You cough up blood.  ?? You have new or worse trouble breathing.  ?? You have a new or higher fever.  ?? You have a new rash.  Watch closely for changes in your health, and be sure to contact your doctor if:  ?? You cough more deeply or more often, especially if you notice more mucus  or a change in the color of your mucus.  ?? You have new symptoms, such as a sore throat, an earache, or sinus pain.  ?? You do not get better as expected.  Where can you learn more?  Go to http://www.healthwise.net/[employer]ctions  Enter D279 in the search box to learn more about "Cough: Care Instructions."  ?? 2006-2016 [employer]. Care instructions adapted under license by [employer] (which disclaims liability or warranty for this information). This care instruction is for use with your licensed healthcare professional. If you have questions about a medical condition or this instruction, always ask your healthcare professional. Healthwise, Incorporated disclaims any warranty or liability for your use of this information.  Content Version: 10.9.538570; Current as of: October 14, 2013

## 2014-11-07 NOTE — Progress Notes (Signed)
Patient is in the office today for a cough, weakness, light headedness, and nausea for about 1 week.    Do you have an Advance Directive no  Do you want more information : information given     1. Have you been to the ER, urgent care clinic since your last visit?  Hospitalized since your last visit?No    2. Have you seen or consulted any other health care providers outside of the [facility] since your last visit?  Include any pap smears or colon screening. No

## 2014-11-07 NOTE — Progress Notes (Signed)
I called and spoke with the patient. Informed her that lab results are good per provider. She verbalized understanding.

## 2014-11-08 ENCOUNTER — Inpatient Hospital Stay: Admit: 2014-11-08 | Payer: MEDICARE | Primary: Family Medicine

## 2014-11-08 LAB — CBC WITH AUTOMATED DIFF
ABS. BASOPHILS: 0 10*3/uL (ref 0.0–0.06)
ABS. EOSINOPHILS: 0 10*3/uL (ref 0.0–0.4)
ABS. LYMPHOCYTES: 1.3 10*3/uL (ref 0.9–3.6)
ABS. MONOCYTES: 0.3 10*3/uL (ref 0.05–1.2)
ABS. NEUTROPHILS: 2.5 10*3/uL (ref 1.8–8.0)
BASOPHILS: 0 % (ref 0–2)
EOSINOPHILS: 1 % (ref 0–5)
HCT: 36 % (ref 35.0–45.0)
HGB: 11.7 g/dL — ABNORMAL LOW (ref 12.0–16.0)
LYMPHOCYTES: 31 % (ref 21–52)
MCH: 31 PG (ref 24.0–34.0)
MCHC: 32.5 g/dL (ref 31.0–37.0)
MCV: 95.5 FL (ref 74.0–97.0)
MONOCYTES: 8 % (ref 3–10)
MPV: 11.9 FL — ABNORMAL HIGH (ref 9.2–11.8)
NEUTROPHILS: 60 % (ref 40–73)
PLATELET: 243 10*3/uL (ref 135–420)
RBC: 3.77 M/uL — ABNORMAL LOW (ref 4.20–5.30)
RDW: 13.5 % (ref 11.6–14.5)
WBC: 4.1 10*3/uL — ABNORMAL LOW (ref 4.6–13.2)

## 2014-11-08 LAB — METABOLIC PANEL, COMPREHENSIVE
A-G Ratio: 1.2 (ref 0.8–1.7)
ALT (SGPT): 24 U/L (ref 13–56)
AST (SGOT): 12 U/L — ABNORMAL LOW (ref 15–37)
Albumin: 3.8 g/dL (ref 3.4–5.0)
Alk. phosphatase: 100 U/L (ref 45–117)
Anion gap: 7 mmol/L (ref 3.0–18)
BUN/Creatinine ratio: 30 — ABNORMAL HIGH (ref 12–20)
BUN: 18 MG/DL (ref 7.0–18)
Bilirubin, total: 0.4 MG/DL (ref 0.2–1.0)
CO2: 28 mmol/L (ref 21–32)
Calcium: 8.9 MG/DL (ref 8.5–10.1)
Chloride: 104 mmol/L (ref 100–108)
Creatinine: 0.6 MG/DL (ref 0.6–1.3)
GFR est AA: >60 mL/min/{1.73_m2} (ref >60)
GFR est non-AA: >60 mL/min/{1.73_m2} (ref >60)
Globulin: 3.2 g/dL (ref 2.0–4.0)
Glucose: 87 mg/dL (ref 74–99)
Potassium: 4.4 mmol/L (ref 3.5–5.5)
Protein, total: 7 g/dL (ref 6.4–8.2)
Sodium: 139 mmol/L (ref 136–145)

## 2014-11-10 NOTE — Progress Notes (Signed)
Tell patient his/her blood work is good

## 2014-11-10 NOTE — Progress Notes (Signed)
Left message for patient to call the office.

## 2014-11-14 ENCOUNTER — Inpatient Hospital Stay: Payer: MEDICARE | Attending: Internal Medicine | Primary: Family Medicine

## 2014-11-14 DIAGNOSIS — R110 Nausea: Secondary | ICD-10-CM

## 2014-11-14 NOTE — Telephone Encounter (Signed)
Patient is at [facility] waiting for order.

## 2014-11-14 NOTE — Addendum Note (Signed)
Addended by: Madelyn Brunner, Quandra Fedorchak L on: 11/14/2014 04:40 PM      Modules accepted: Orders

## 2014-11-14 NOTE — Telephone Encounter (Signed)
Order placed

## 2014-11-14 NOTE — Telephone Encounter (Signed)
Cathy [name] called requesting order CT HEAD WO CONT   Please send order with ordering provider name. Pt is in office waiting .  Thanks

## 2014-11-14 NOTE — Telephone Encounter (Signed)
Pt called upset stating she has been at [facility] waiting to do a CT Scan .  Pt was told Dr. Madelyn Brunner was in with patients and also apologized for any  Inconvenience.  Pt states they closed and she will figure something else out . Please advise.  Cathy [name]

## 2014-11-14 NOTE — Telephone Encounter (Signed)
Spoke with Lanny Hurst in [place] he is aware order was placed.  Lanny Hurst states patient left.  Left message for patient CT was reordered.

## 2014-11-17 ENCOUNTER — Inpatient Hospital Stay: Admit: 2014-11-17 | Payer: MEDICARE | Attending: Internal Medicine | Primary: Family Medicine

## 2014-11-17 DIAGNOSIS — R110 Nausea: Secondary | ICD-10-CM

## 2014-11-17 NOTE — Progress Notes (Signed)
Tell patient his/her CT of her head is good

## 2014-11-20 NOTE — Progress Notes (Signed)
Pt returned call and is aware of Dr Lionel December message:  Tell patient his/her CT of her head is good

## 2014-11-20 NOTE — Progress Notes (Signed)
I called the patient but there was no answer. So i left her a message requesting a call back.

## 2014-11-29 ENCOUNTER — Encounter: Admit: 2014-12-14 | Discharge: 2014-12-14 | Payer: MEDICARE | Primary: Family Medicine

## 2015-03-30 ENCOUNTER — Encounter: Attending: Specialist | Primary: Family Medicine

## 2015-04-09 ENCOUNTER — Inpatient Hospital Stay: Admit: 2015-04-09 | Payer: MEDICARE | Primary: Family Medicine

## 2015-04-09 ENCOUNTER — Encounter: Admit: 2015-04-09 | Discharge: 2015-04-09 | Payer: MEDICARE | Attending: Internal Medicine | Primary: Family Medicine

## 2015-04-09 DIAGNOSIS — E7800 Pure hypercholesterolemia, unspecified: Secondary | ICD-10-CM

## 2015-04-10 LAB — METABOLIC PANEL, COMPREHENSIVE
A-G Ratio: 1.5 (ref 0.8–1.7)
ALT (SGPT): 26 U/L (ref 13–56)
AST (SGOT): 13 U/L — ABNORMAL LOW (ref 15–37)
Albumin: 3.9 g/dL (ref 3.4–5.0)
Alk. phosphatase: 106 U/L (ref 45–117)
Anion gap: 8 mmol/L (ref 3.0–18)
BUN/Creatinine ratio: 28 — ABNORMAL HIGH (ref 12–20)
BUN: 22 MG/DL — ABNORMAL HIGH (ref 7.0–18)
Bilirubin, total: 0.5 MG/DL (ref 0.2–1.0)
CO2: 29 mmol/L (ref 21–32)
Calcium: 8.4 MG/DL — ABNORMAL LOW (ref 8.5–10.1)
Chloride: 103 mmol/L (ref 100–108)
Creatinine: 0.78 MG/DL (ref 0.6–1.3)
GFR est AA: >60 mL/min/{1.73_m2} (ref >60)
GFR est non-AA: >60 mL/min/{1.73_m2} (ref >60)
Globulin: 2.6 g/dL (ref 2.0–4.0)
Glucose: 88 mg/dL (ref 74–99)
Potassium: 3.9 mmol/L (ref 3.5–5.5)
Protein, total: 6.5 g/dL (ref 6.4–8.2)
Sodium: 140 mmol/L (ref 136–145)

## 2015-04-10 LAB — LIPID PANEL
CHOL/HDL Ratio: 2.2 (ref 0–5.0)
Cholesterol, total: 161 MG/DL (ref <200)
HDL Cholesterol: 73 MG/DL — ABNORMAL HIGH (ref 40–60)
LDL, calculated: 79.8 MG/DL (ref 0–100)
Triglyceride: 41 MG/DL (ref <150)
VLDL, calculated: 8.2 MG/DL

## 2015-04-13 ENCOUNTER — Institutional Professional Consult (permissible substitution): Admit: 2015-04-13 | Discharge: 2015-04-13 | Payer: MEDICARE | Attending: Specialist | Primary: Family Medicine

## 2015-04-13 DIAGNOSIS — I5022 Chronic systolic (congestive) heart failure: Secondary | ICD-10-CM

## 2015-04-16 ENCOUNTER — Encounter: Admit: 2015-04-16 | Payer: MEDICARE | Attending: Internal Medicine | Primary: Family Medicine

## 2015-04-24 ENCOUNTER — Ambulatory Visit: Admit: 2015-04-24 | Discharge: 2015-04-24 | Payer: MEDICARE | Attending: Specialist | Primary: Family Medicine

## 2015-04-24 DIAGNOSIS — I10 Essential (primary) hypertension: Secondary | ICD-10-CM

## 2015-04-24 NOTE — Progress Notes (Signed)
1. Have you been to the ER, urgent care clinic since your last visit?  Hospitalized since your last visit?     No    2. Have you seen or consulted any other health care providers outside of the [facility] since your last visit?  Include any pap smears or colon screening.      Yes Where: Dr Hunt/PCP     3.  Since your last visit, have you had any of the following symptoms?      .         4.  Have you had any blood work, X-rays or cardiac testing?      Yes Where: Dr [facility]/PCP              5.  Where do you normally have your labs drawn?   Dr [facility]/PCP    6. Do you need any refills today?   No

## 2015-04-24 NOTE — Progress Notes (Signed)
HISTORY OF PRESENT ILLNESS  Cathy [name] is a 65 y.o. female.    HPI Comments: Patient with chf,cmp,mr syncope  Happened while eating at the restaurant-icd eval-no significant arrythmia  No recurrence  Patient is here for follow up of diagnostic tests.Results will be discussed.      CHF   The history is provided by the patient. This is a chronic problem. The problem occurs constantly. The problem has not changed since onset.Associated symptoms include shortness of breath. Pertinent negatives include no chest pain. The symptoms are aggravated by exertion.   Shortness of Breath   The history is provided by the patient. This is a recurrent problem. The problem occurs frequently.The problem has not changed since onset.Pertinent negatives include no fever, no cough, no sputum production, no hemoptysis, no wheezing, no PND, no orthopnea, no chest pain, no vomiting, no rash, no leg swelling and no claudication.       Review of Systems   Constitutional: Negative for chills and fever.   HENT: Negative for nosebleeds.    Eyes: Negative for blurred vision and double vision.   Respiratory: Positive for shortness of breath. Negative for cough, hemoptysis, sputum production and wheezing.    Cardiovascular: Negative for chest pain, palpitations, orthopnea, claudication, leg swelling and PND.   Gastrointestinal: Negative for heartburn, nausea and vomiting.   Musculoskeletal: Negative for myalgias.   Skin: Negative for rash.   Neurological: Positive for loss of consciousness. Negative for dizziness and weakness.   Endo/Heme/Allergies: Does not bruise/bleed easily.     Family History   Problem Relation Age of Onset   ??? Diabetes Mother    ??? Heart Attack Father      MI   ??? Heart Disease Maternal Grandmother    ??? Heart Disease Paternal Grandmother    ??? Kidney Disease Other      1 sib deceased   ??? Cancer Other      1 sib throat cancer   ??? Diabetes Other    ??? Diabetes Daughter    ??? Other Daughter      leukemia    ??? Hypertension Other        Past Medical History:   Diagnosis Date   ??? Automatic implantable cardiac defibrillator in situ     Post ddd icd Set up [facility]   ??? Breast mass, left     benign   ??? CHF (congestive heart failure) ([place]) 06/23/2008    Non-ischemic CMP , Cath (2008) Normal coronary arteries   ??? Chronic systolic heart failure (HCC)     Stable,    ??? Degenerative arthritis of left knee    ??? Fibroid 06/23/2008   ??? HLD (hyperlipidemia)    ??? HTN (hypertension) 06/23/2008   ??? Hypotension, unspecified     Related to lisinopril   ??? 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 Left knee pain     With swelling   ??? Leg pain    ??? 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 OSA (obstructive sleep apnea) 9/10   ??? Venous reflux    ??? Wears glasses        Past Surgical History:   Procedure Laterality Date   ??? HX BREAST BIOPSY      rt breast, benign   ??? HX CHOLECYSTECTOMY     ??? HX CRANIOTOMY  2013    meningioma    ??? HX  HEENT      glasses   ??? HX PACEMAKER      dual chamber icd   ??? HX TONSIL AND ADENOIDECTOMY     ??? HX TOTAL ABDOMINAL HYSTERECTOMY  1996   ??? HX TUBAL LIGATION         Allergies   Allergen Reactions   ??? Tramadol Other (comments)     Psychotic Hallucinations   ??? Pcn [Penicillins] Itching     Itching bumps on hands and arms       Current Outpatient Prescriptions   Medication Sig   ??? fluticasone (FLONASE) 50 mcg/actuation nasal spray 2 Sprays by Both Nostrils route daily. (Patient taking differently: 2 Sprays by Both Nostrils route daily. As needed)   ??? lisinopril (PRINIVIL, ZESTRIL) 5 mg tablet Take 1 Tab by mouth daily.   ??? carvedilol (COREG) 3.125 mg tablet Take 1 Tab by mouth two (2) times a day.   ??? furosemide (LASIX) 20 mg tablet Take 1 Tab by mouth daily.   ??? CRESTOR 5 mg tablet TAKE ONE TABLET BY MOUTH EVERY EVENING   ??? meloxicam (MOBIC) 15 mg tablet Take 1 Tab by mouth daily (with breakfast). (Patient taking differently: Take 15 mg by mouth daily (with  breakfast). As needed)   ??? chlorpheniramine-HYDROcodone (TUSSIONEX) 10-8 mg/5 mL suspension Take 5 mL by mouth every twelve (12) hours as needed for Cough. Max Daily Amount: 10 mL.   ??? albuterol (PROVENTIL HFA, VENTOLIN HFA, PROAIR HFA) 90 mcg/actuation inhaler Take 2 Puffs by inhalation every four (4) hours as needed for Wheezing or Shortness of Breath (Cough).   ??? inhalational spacing device ALWAYS USE WITH INHALER     No current facility-administered medications for this visit.        Visit Vitals   ??? BP 93/50   ??? Pulse 81   ??? Ht 5\' 9"  (1.753 m)   ??? Wt 90.3 kg (199 lb)   ??? BMI 29.39 kg/m2         Physical Exam   Constitutional: She is oriented to person, place, and time. She appears well-developed and well-nourished.   HENT:   Head: Normocephalic and atraumatic.   Eyes: Conjunctivae are normal.   Neck: Neck supple. No JVD present. No tracheal deviation present. No thyromegaly present.   Cardiovascular: Normal rate and regular rhythm.  PMI is not displaced.  Exam reveals no gallop, no S3 and no decreased pulses.    Murmur heard.   Holosystolic murmur is present with a grade of 2/6  at the lower left sternal border, apex  Pulmonary/Chest: No respiratory distress. She has no wheezes. She has no rales. She exhibits no tenderness.   Abdominal: Soft. There is no tenderness.   Musculoskeletal: She exhibits no edema.   Neurological: She is alert and oriented to person, place, and time.   Skin: Skin is warm.   Psychiatric: She has a normal mood and affect.     CARDIOLOGY STUDIES 08/25/2010 10/25/2008 07/25/2008 01/25/2008 02/24/2006   Myocardial Perfusion Scan Result - - FIXED INF AND REVERSIBLE ANT APICAL DEFECT - -   Cardiac Cath Result - - - - EF 25%, NO SIGNIFICANT CAD   Echocardiogram - Complete Result DILATED 20%, MILD MR - - EF 25% -   Doppler US Arterial Result - NL SCAN - - -           Cathy [name] has a reminder for a "due or due soon" health maintenance. I  have asked that she  contact her primary care provider for follow-up on this health maintenance.    SUMMARY:echo:01/2013  Left ventricle: The ventricle was moderately to severely dilated. Systolic  function was moderately to markedly reduced. Ejection fraction was  estimated in the range of 20 % to 25 %. There was severe diffuse  hypokinesis. Features were consistent with a pseudonormal left ventricular  filling pattern, with concomitant abnormal relaxation and increased  filling pressure (grade 2 diastolic dysfunction). Intracavitary echoes  most suggestive of apical trabeculations were present.    Left atrium: The atrium was severely dilated. LA volume index was 91  ml/m squared.    Right atrium: The atrium was mildly dilated.    Mitral valve: There was mild annular calcification. There was mild diffuse  thickening of the anterior and posterior leaflets. In certain images the  leaflet tips exhibit minimally reduced coaptation. There was moderate to  severe regurgitation. Mean transmitral gradient was 2.7 mmHg. The  effective orifice of mitral regurgitation by proximal isovelocity surface  area was 0.22 cm squared. The volume of mitral regurgitation by proximal  isovelocity surface area was 39 ml.    Tricuspid valve: There was mild regurgitation. Pulmonary artery systolic  pressure: 56 mmHg.   SUMMARY:echo-08/2014  Left ventricle: The ventricle was moderately dilated. Systolic function  was severely reduced. Ejection fraction was estimated to be 20 %. There  was severe diffuse hypokinesis. Doppler parameters were consistent with  abnormal left ventricular relaxation (grade 1 diastolic dysfunction).    Left atrium: The atrium was severely dilated.    Atrial septum: There was no evidence of intracardiac shunt by  peripherally-administered agitated saline contrast.    Right atrium: There was a possible, small, mobile mass associated with the  pacing wire.     Mitral valve: There was mild annular calcification. There was mild diffuse  thickening of the anterior and posterior leaflets. There was moderate  regurgitation. The effective orifice of mitral regurgitation by proximal  isovelocity surface area was 0.1 cm??????. The volume of mitral regurgitation  by proximal isovelocity surface area was 27 ml.    Tricuspid valve: There was mild regurgitation. Pulmonary artery systolic  pressure: 30 mmHg.  Assessment       ICD-10-CM ICD-9-CM    1. Essential hypertension I10 401.9     low normal stable   2. Chronic systolic heart failure (HCC) I50.22 428.22     stable  nyha class2-3   3. Cardiomyopathy ([place]) I42.9 425.4     stable   4. Mitral valve disorders I05.9 424.0     mr     5. AICD (automatic cardioverter/defibrillator) present Z95.810 V45.02     normal function     On low dose coreg and lisinopril as Patient had hypotension in past with higher dose  There are no discontinued medications.    No orders of the defined types were placed in this encounter.      Follow-up Disposition:  Return in about 6 months (around 10/22/2015).

## 2015-04-25 ENCOUNTER — Ambulatory Visit: Admit: 2015-04-25 | Discharge: 2015-04-25 | Payer: MEDICARE | Attending: Internal Medicine | Primary: Family Medicine

## 2015-04-25 DIAGNOSIS — Z0000 Encounter for general adult medical examination without abnormal findings: Secondary | ICD-10-CM

## 2015-04-25 MED ORDER — NALTREXONE 8 MG-BUPROPION 90 MG TABLET,EXTENDED RELEASE
8-90 mg | ORAL_TABLET | ORAL | 0 refills | Status: DC
Start: 2015-04-25 — End: 2015-05-24

## 2015-04-25 NOTE — Patient Instructions (Addendum)
Advance Directives: Care Instructions  Your Care Instructions  An advance directive is a legal way to state your wishes at the end of your life. It tells your family and your doctor what to do if you can no longer say what you want.  There are two main types of advance directives. You can change them any time that your wishes change.  ?? A living will tells your family and your doctor your wishes about life support and other treatment.  ?? A medical power of attorney lets you name a person to make treatment decisions for you when you can't speak for yourself. This person is called a health care agent.  If you do not have an advance directive, decisions about your medical care may be made by a doctor or a judge who doesn't know you.  It may help to think of an advance directive as a gift to the people who care for you. If you have one, they won't have to make tough decisions by themselves.  Follow-up care is a key part of your treatment and safety. Be sure to make and go to all appointments, and call your doctor if you are having problems. It's also a good idea to know your test results and keep a list of the medicines you take.  How can you care for yourself at home?  ?? Discuss your wishes with your loved ones and your doctor. This way, there are no surprises.  ?? Many states have a unique form. Or you might use a universal form that has been approved by many states. This kind of form can sometimes be completed and stored online. Your electronic copy will then be available wherever you have a connection to the Internet. In most cases, doctors will respect your wishes even if you have a form from a different state.  ?? You don't need a lawyer to do an advance directive. But you may want to get legal advice.  ?? Think about these questions when you prepare an advance directive:  ?? Who do you want to make decisions about your medical care if you are not  able to? Many people choose a family member, close friend, or doctor.  ?? Do you know enough about life support methods that might be used? If not, talk to your doctor so you understand.  ?? What are you most afraid of that might happen? You might be afraid of having pain, losing your independence, or being kept alive by machines.  ?? Where would you prefer to die? Choices include your home, a hospital, or a nursing home.  ?? Would you like to have information about hospice care to support you and your family?  ?? Do you want to donate organs when you die?  ?? Do you want certain religious practices performed before you die? If so, put your wishes in the advance directive.  ?? Read your advance directive every year, and make changes as needed.  When should you call for help?  Be sure to contact your doctor if you have any questions.  Where can you learn more?  Go to http://www.healthwise.net/[employer].  Enter R264 in the search box to learn more about "Advance Directives: Care Instructions."  Current as of: April 19, 2014  Content Version: 11.1  ?? 2006-2016 [employer]. Care instructions adapted under license by [employer] (which disclaims liability or warranty for this information). If you have questions about a medical condition or this instruction, always ask your   healthcare professional. [place] any warranty or liability for your use of this information.    Medicare Part B Preventive Services Limitations Recommendation Scheduled   Bone Mass Measurement  (age 15 & older, biennial) Requires diagnosis related to osteoporosis or estrogen deficiency. Biennial benefit unless patient has history of long-term glucocorticoid tx or baseline is needed because initial test was by other method  ordered   Cardiovascular Screening Blood Tests (every 5 years)  Total cholesterol, HDL, Triglycerides Order as a panel if possible  03/2015   Colorectal Cancer Screening   -Fecal occult blood test (annual)  -Flexible sigmoidoscopy (5y)  -Screening colonoscopy (10y)  -Barium Enema   08/2009   Counseling to Prevent Tobacco Use (up to 8 sessions per year)  - Counseling greater than 3 and up to 10 minutes  - Counseling greater than 10 minutes Patients must be asymptomatic of tobacco-related conditions to receive as preventive service  NA   Diabetes Screening Tests (at least every 3 years, Medicare covers annually or at 25-month intervals for prediabetic patients)    Fasting blood sugar (FBS) or glucose tolerance test (GTT) Patient must be diagnosed with one of the following:  -Hypertension, Dyslipidemia, obesity, previous impaired FBS or GTT  ???Or any two of the following: overweight, FH of diabetes, age ?64, history of gestational diabetes, birth of baby weighing more than 9 pounds  03/2015   Diabetes Self-Management Training (DSMT) (no USPSTF recommendation) Requires referral by treating physician for patient with diabetes or renal disease. 10 hours of initial DSMT session of no less than 30 minutes each in a continuous 37-month period.  2 hours of follow-up DSMT in subsequent years.  NA   Glaucoma Screening (no USPSTF recommendation) Diabetes mellitus, family history, African American, age 28 or over, Hispanic American, age 26 or over  05/2012   Human Immunodeficiency Virus (HIV) Screening (annually for increased risk patients)  HIV-1 and HIV-2 by EIA, ELISA, rapid antibody test, or oral mucosa transudate Patient must be at increased risk for HIV infection per USPSTF guidelines or pregnant.  Tests covered annually for patients at increased risk.  Pregnant patients may receive up to 3 test during pregnancy.  NA   Medical Nutrition Therapy (MNT) (for diabetes or renal disease not recommended schedule) Requires referral by treating physician for patient with diabetes or renal disease.  Can be provided in same year as diabetes  self-management training (DSMT), and CMS recommends medical nutrition therapy take place after DSMT.  Up to 3 hours for initial year and 2 hours in subsequent years.  NA   Shingles Vaccination A shingles vaccine is also recommended once in a lifetime after age 47     Seasonal Influenza Vaccination (annually)      Pneumococcal Vaccination (once after 7)      Hepatitis B Vaccinations (if medium/high risk) Medium/high risk factors:  End-stage renal disease,  Hemophiliacs who received Factor VIII or IX concentrates, Clients of institutions for the mentally retarded, Persons who live in the same house as a HepB virus carrier, Homosexual men, Illicit injectable drug abusers.  NA   Screening Mammography (biennial age 41-74) Annually (age 24 or over)  09/2014   Screening Pap Tests and Pelvic Examination (up to age 71 and after 27 if unknown history or abnormal study last 10 years) Every 52 months except high risk     Ultrasound Screening for Abdominal Aortic Aneurysm (AAA) (once) Patient must be referred through IPPE and not have had a screening for abdominal aortic  aneurysm before under Medicare.  Limited to patients who meet one of the following criteria:  - Men who are 34-31 years old and have smoked more than 100 cigarettes in their lifetime.  -Anyone with a FH of AAA  -Anyone recommended for screening by USPSTF  NA       Medicare Part B Preventive Services Limitations Recommendation Scheduled   Bone Mass Measurement  (age 76 & older, biennial) Requires diagnosis related to osteoporosis or estrogen deficiency. Biennial benefit unless patient has history of long-term glucocorticoid tx or baseline is needed because initial test was by other method  Ordered    Cardiovascular Screening Blood Tests (every 5 years)  Total cholesterol, HDL, Triglycerides Order as a panel if possible  03/2015   Colorectal Cancer Screening  -Fecal occult blood test (annual)  -Flexible sigmoidoscopy (5y)  -Screening colonoscopy (10y)   -Barium Enema   05/2009   Counseling to Prevent Tobacco Use (up to 8 sessions per year)  - Counseling greater than 3 and up to 10 minutes  - Counseling greater than 10 minutes Patients must be asymptomatic of tobacco-related conditions to receive as preventive service  NA   Diabetes Screening Tests (at least every 3 years, Medicare covers annually or at 98-month intervals for prediabetic patients)    Fasting blood sugar (FBS) or glucose tolerance test (GTT) Patient must be diagnosed with one of the following:  -Hypertension, Dyslipidemia, obesity, previous impaired FBS or GTT  ???Or any two of the following: overweight, FH of diabetes, age ?60, history of gestational diabetes, birth of baby weighing more than 9 pounds  03/2015   Diabetes Self-Management Training (DSMT) (no USPSTF recommendation) Requires referral by treating physician for patient with diabetes or renal disease. 10 hours of initial DSMT session of no less than 30 minutes each in a continuous 78-month period.  2 hours of follow-up DSMT in subsequent years.  NA   Glaucoma Screening (no USPSTF recommendation) Diabetes mellitus, family history, African American, age 26 or over, Hispanic American, age 38 or over  Ordered    Human Immunodeficiency Virus (HIV) Screening (annually for increased risk patients)  HIV-1 and HIV-2 by EIA, ELISA, rapid antibody test, or oral mucosa transudate Patient must be at increased risk for HIV infection per USPSTF guidelines or pregnant.  Tests covered annually for patients at increased risk.  Pregnant patients may receive up to 3 test during pregnancy.  NA   Medical Nutrition Therapy (MNT) (for diabetes or renal disease not recommended schedule) Requires referral by treating physician for patient with diabetes or renal disease.  Can be provided in same year as diabetes self-management training (DSMT), and CMS recommends medical nutrition therapy take place after DSMT.  Up to 3 hours for initial year and 2 hours  in subsequent years.  NA   Shingles Vaccination A shingles vaccine is also recommended once in a lifetime after age 27     Seasonal Influenza Vaccination (annually)      Pneumococcal Vaccination (once after 86)      Hepatitis B Vaccinations (if medium/high risk) Medium/high risk factors:  End-stage renal disease,  Hemophiliacs who received Factor VIII or IX concentrates, Clients of institutions for the mentally retarded, Persons who live in the same house as a HepB virus carrier, Homosexual men, Illicit injectable drug abusers.  NA   Screening Mammography (biennial age 78-74) Annually (age 32 or over)  03/2014   Screening Pap Tests and Pelvic Examination (up to age 70 and after 53 if unknown history  or abnormal study last 10 years) Every 24 months except high risk     Ultrasound Screening for Abdominal Aortic Aneurysm (AAA) (once) Patient must be referred through IPPE and not have had a screening for abdominal aortic aneurysm before under Medicare.  Limited to patients who meet one of the following criteria:  - Men who are 35-72 years old and have smoked more than 100 cigarettes in their lifetime.  -Anyone with a FH of AAA  -Anyone recommended for screening by USPSTF  NA

## 2015-04-25 NOTE — Progress Notes (Signed)
Verbal order read back per Dr. Hunte Prevnar 13 vaccine.  Patient received Prevnar 13 vaccine in right deltoid.  Patient tolerated well and left without complaints.  Patient received Prevnar 13 VIS.

## 2015-04-25 NOTE — ACP (Advance Care Planning) (Signed)
Patient has information on Advance Directive and is working on it.

## 2015-04-25 NOTE — Progress Notes (Signed)
Patient is in the office today for a 7 month follow up, and Medicare Wellness Visit.     Do you have an Advance Directive no  Do you want more information : working on form    1. Have you been to the ER, urgent care clinic since your last visit?  Hospitalized since your last visit?No    2. Have you seen or consulted any other health care providers outside of the [place] since your last visit?  Include any pap smears or colon screening. No        This is an Initial Medicare Annual Wellness Exam (AWV) (Performed 12 months after IPPE or effective date of Medicare Part B enrollment, Once in a lifetime)    I have reviewed the patient's medical history in detail and updated the computerized patient record.     History     Past Medical History:   Diagnosis Date   ??? Automatic implantable cardiac defibrillator in situ     Post ddd icd Set up [facility]   ??? Breast mass, left     benign   ??? CHF (congestive heart failure) ([place]) 06/23/2008    Non-ischemic CMP , Cath (2008) Normal coronary arteries   ??? Chronic systolic heart failure (HCC)     Stable,    ??? Degenerative arthritis of left knee    ??? Fibroid 06/23/2008   ??? HLD (hyperlipidemia)    ??? HTN (hypertension) 06/23/2008   ??? Hypotension, unspecified     Related to lisinopril   ??? 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 Left knee pain     With swelling   ??? Leg pain    ??? 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 OSA (obstructive sleep apnea) 9/10   ??? Venous reflux    ??? Wears glasses       Past Surgical History:   Procedure Laterality Date   ??? HX BREAST BIOPSY      rt breast, benign   ??? HX CHOLECYSTECTOMY     ??? HX CRANIOTOMY  2013    meningioma    ??? HX HEENT      glasses   ??? HX PACEMAKER      dual chamber icd   ??? HX TONSIL AND ADENOIDECTOMY     ??? HX TOTAL ABDOMINAL HYSTERECTOMY  1996   ??? HX TUBAL LIGATION       Current Outpatient Prescriptions   Medication Sig Dispense Refill    ??? naltrexone-buPROPion (CONTRAVE) 8-90 mg TbER ER tablet First Month: Contrave 8mg /90mg  #70    Week 1 : 1 Tab AM  Week 2 : 1 Tab AM, 1 Tab PM  Week 3 : 2 Tab AM, 1 Tab PM  Week 4 : 2 Tab AM, 2 Tab PM  Indications: WEIGHT LOSS MANAGEMENT FOR OBESE PATIENT (BMI >= 30) 70 Tab 0   ??? lisinopril (PRINIVIL, ZESTRIL) 5 mg tablet Take 1 Tab by mouth daily. 30 Tab 6   ??? carvedilol (COREG) 3.125 mg tablet Take 1 Tab by mouth two (2) times a day. 60 Tab 6   ??? furosemide (LASIX) 20 mg tablet Take 1 Tab by mouth daily. 30 Tab 6   ??? CRESTOR 5 mg tablet TAKE ONE TABLET BY MOUTH EVERY EVENING 90 Tab 2   ??? meloxicam (MOBIC) 15 mg tablet Take 1 Tab by mouth daily (with breakfast). (Patient taking  differently: Take 15 mg by mouth daily (with breakfast). As needed) 30 Tab 0   ??? inhalational spacing device ALWAYS USE WITH INHALER 1 Device 0   ??? fluticasone (FLONASE) 50 mcg/actuation nasal spray 2 Sprays by Both Nostrils route daily. (Patient taking differently: 2 Sprays by Both Nostrils route daily. As needed) 1 Bottle 5   ??? albuterol (PROVENTIL HFA, VENTOLIN HFA, PROAIR HFA) 90 mcg/actuation inhaler Take 2 Puffs by inhalation every four (4) hours as needed for Wheezing or Shortness of Breath (Cough). 1 Inhaler 0     Allergies   Allergen Reactions   ??? Tramadol Other (comments)     Psychotic Hallucinations   ??? Penicillamine Itching   ??? Pcn [Penicillins] Itching     Itching bumps on hands and arms     Family History   Problem Relation Age of Onset   ??? Diabetes Mother    ??? Heart Attack Father      MI   ??? Heart Disease Maternal Grandmother    ??? Heart Disease Paternal Grandmother    ??? Kidney Disease Other      1 sib deceased   ??? Cancer Other      1 sib throat cancer   ??? Diabetes Other    ??? Diabetes Daughter    ??? Other Daughter      leukemia   ??? Hypertension Other      Social History   Substance Use Topics   ??? Smoking status: Never Smoker   ??? Smokeless tobacco: Never Used   ??? Alcohol use No     Patient Active Problem List   Diagnosis Code    ??? CHF (congestive heart failure) (HCC) I50.9   ??? Hypothyroid hx goiter E03.9   ??? Fibroid D25.9   ??? HTN (hypertension) I10   ??? History of elevated lipids Z86.39   ??? Gallstones K80.20   ??? Insomnia G47.00   ??? OSA (obstructive sleep apnea) G47.33   ??? Cataract H26.9   ??? Cardiomyopathy (HCC) I42.9   ??? AICD (automatic cardioverter/defibrillator) present Z95.810   ??? Chronic systolic heart failure (HCC) I50.22   ??? Other primary cardiomyopathies (HCC) I42.8   ??? Automatic implantable cardioverter-defibrillator in situ Z95.810   ??? Hypotension, unspecified I95.9   ??? Other and unspecified hyperlipidemia E78.5   ??? AR (allergic rhinitis) J30.9   ??? Brain tumor (benign) (HCC) D33.2   ??? High cholesterol E78.00   ??? Meningioma (HCC) D32.9   ??? 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 Mitral valve disorders I05.9   ??? Non-rheumatic mitral regurgitation I34.0         Depression Risk Factor Screening:     PHQ 2 / 9, over the last two weeks 04/25/2015   Little interest or pleasure in doing things Not at all   Feeling down, depressed or hopeless Not at all   Total Score PHQ 2 0     Alcohol Risk Factor Screening:   Patient states she does not drink alcohol.    Functional Ability and Level of Safety:     Hearing Loss   Patient states she does not have any hearing loss.    Activities of Daily Living   Self-care.   Requires assistance with: no ADLs    Fall Risk     Fall Risk Assessment, last 12 mths 04/25/2015   Able to walk? Yes   Fall in past 12 months? No     Abuse Screen   Patient is not abused  Review of Systems   A comprehensive review of systems was negative except for that written in the HPI.    Physical Examination     No exam data present    Evaluation of Cognitive Function:  Mood/affect:  neutral  Appearance: age appropriate  Family member/caregiver input: none    Visit Vitals   ??? BP 100/58 (BP 1 Location: Left arm, BP Patient Position: Sitting)   ??? Pulse 78   ??? Temp 98.1 ??F (36.7 ??C) (Oral)   ??? Resp 18   ??? Ht 5\' 9"  (1.753 m)   ??? Wt 199 lb (90.3 kg)    ??? SpO2 98%   ??? BMI 29.39 kg/m2       Patient Care Team:  Thana Farr, MD as PCP - General (Internal Medicine)  Kerrin Champagne, MD (Ophthalmology)  Tona Sensing, MD (Cardiology)    Advice/Referrals/Counseling   Education and counseling provided:  Are appropriate based on today's review and evaluation  End-of-Life planning (with patient's consent)  Pneumococcal Vaccine    Glaucoma Screening- [facility] 2 yrs ago. Pt will f/u.  Pneumonia Vaccine-  Give PCV-13 today   Shingles Vaccine- never had chicken pox   Tdap Vaccine- not in last 10 years.  Declines at this time.   Colonoscopy-  08/2009 f/u 10 yrs 08/2019  Mammogram 09/2014 f/u 03/2015  Advance Directive-  Does not have one. Given information.     Assessment/Plan       ICD-10-CM ICD-9-CM    1. Routine general medical examination at a health care facility Z00.00 V70.0    2. Screening for alcoholism Z13.89 V79.1    3. Postmenopausal Z78.0 V49.81 DEXA BONE DENSITY STUDY AXIAL   4. Essential hypertension I10 401.9    5. High cholesterol E78.00 272.0    6. Non morbid obesity due to excess calories E66.09 278.00    7. Encounter for immunization Z23 V03.89 PNEUMOCOCCAL CONJ VACCINE 13 VALENT IM      ADMIN PNEUMOCOCCAL VACCINE   .    A comprehensive 5 year plan for medical care and screening exams was reviewed with pt and they received a copy of it.

## 2015-04-25 NOTE — Progress Notes (Signed)
.  Subjective:       Chief Complaint  The patient presents for follow up of hypertension and high cholesterol. Cardiomyopathy         HPI  Cathy [name] is a 65 y.o. female seen for follow up of hyperlipidemia.  Shealso has hypertension. Hypertension well controlled, no significant medication side effects noted, on Coreg and lisinopril, her SBP goes below 100 at times and she has been told by cardiology to hold lisinopril at that time. Pt will also do a diary to see when she has these hypotensive episodes if it is after she has not eaten for over 4-5 hrs which is similar to what happened yesterday, hyperlipidemia fairly well controlled, no significant medication side effects noted, on Crestor. Pt has had difficulty tolerating any higher dose of the statin    Diet and Lifestyle: generally follows a low fat low cholesterol diet    Home BP Monitoring: is not measured at home.    Other symptoms and concerns: Cardiovascular Review:  The patient has CHF/Cardiomyopathy   Diet and Lifestyle: generally follows a low fat low cholesterol diet, exercises regularly   Home BP Monitoring: is not measured at home.  Pertinent ROS: taking medications as instructed, no medication side effects noted, no TIA's, no chest pain on exertion, no dyspnea on exertion, no swelling of ankles.     Vit D deficiency is mild. Pt currently not on any supplementation.      Pt is concerned about her inability to lose weight on her own. She has been around BMI of 30 for a year. She exercises 1-2x/week. She has been advised to increase it to 2-3x/week and cut back starches and sweets in her diet. Pt wants to try weight loss med. The only one she could possible take with her cardiomyopathy is Contrave which was explained to her along with possible side effects.     Discussed the patient's BMI with her.  The BMI follow up plan is as follows: BMI is out of normal parameters and plan is as follows: I have  counseled this patient on diet and exercise regimens.      Current Outpatient Prescriptions   Medication Sig   ??? naltrexone-buPROPion (CONTRAVE) 8-90 mg TbER ER tablet First Month: Contrave '8mg'$ /'90mg'$  #70    Week 1 : 1 Tab AM  Week 2 : 1 Tab AM, 1 Tab PM  Week 3 : 2 Tab AM, 1 Tab PM  Week 4 : 2 Tab AM, 2 Tab PM  Indications: WEIGHT LOSS MANAGEMENT FOR OBESE PATIENT (BMI >= 30)   ??? lisinopril (PRINIVIL, ZESTRIL) 5 mg tablet Take 1 Tab by mouth daily.   ??? carvedilol (COREG) 3.125 mg tablet Take 1 Tab by mouth two (2) times a day.   ??? furosemide (LASIX) 20 mg tablet Take 1 Tab by mouth daily.   ??? CRESTOR 5 mg tablet TAKE ONE TABLET BY MOUTH EVERY EVENING   ??? meloxicam (MOBIC) 15 mg tablet Take 1 Tab by mouth daily (with breakfast). (Patient taking differently: Take 15 mg by mouth daily (with breakfast). As needed)   ??? inhalational spacing device ALWAYS USE WITH INHALER   ??? fluticasone (FLONASE) 50 mcg/actuation nasal spray 2 Sprays by Both Nostrils route daily. (Patient taking differently: 2 Sprays by Both Nostrils route daily. As needed)   ??? albuterol (PROVENTIL HFA, VENTOLIN HFA, PROAIR HFA) 90 mcg/actuation inhaler Take 2 Puffs by inhalation every four (4) hours as needed for Wheezing or Shortness of Breath (  Cough).     No current facility-administered medications for this visit.              Review of Systems  Respiratory: negative for dyspnea on exertion  Cardiovascular: negative for chest pain    Objective:     Visit Vitals   ??? BP 100/58 (BP 1 Location: Left arm, BP Patient Position: Sitting)   ??? Pulse 78   ??? Temp 98.1 ??F (36.7 ??C) (Oral)   ??? Resp 18   ??? Ht _0  (1.753 m)   ??? Wt 199 lb (90.3 kg)   ??? SpO2 98%   ??? BMI 29.39 kg/m2        General appearance - alert, well appearing, and in no distress  Neck - supple, no significant adenopathy  Chest - clear to auscultation, no wheezes, rales or rhonchi, symmetric air entry  Heart - normal rate, regular rhythm, normal S1, S2, no murmurs, rubs, clicks or gallops   Extremities - peripheral pulses normal, no pedal edema, no clubbing or cyanosis  Skin - normal coloration and turgor, no rashes, no suspicious skin lesions noted      Labs:   Lab Results   Component Value Date/Time    Cholesterol, total 161 04/09/2015 08:11 AM    HDL Cholesterol 73 04/09/2015 08:11 AM    LDL, calculated 79.8 04/09/2015 08:11 AM    Triglyceride 41 04/09/2015 08:11 AM    CHOL/HDL Ratio 2.2 04/09/2015 08:11 AM     Lab Results   Component Value Date/Time    ALT (SGPT) 26 04/09/2015 08:11 AM    AST (SGOT) 13 04/09/2015 08:11 AM    Alk. phosphatase 106 04/09/2015 08:11 AM    Bilirubin, direct 0.1 06/07/2008 10:46 AM    Bilirubin, total 0.5 04/09/2015 08:11 AM     Lab Results   Component Value Date/Time    GFR est AA >60 04/09/2015 08:11 AM    GFR est non-AA >60 04/09/2015 08:11 AM    Creatinine (POC) 0.9 07/21/2012 08:08 AM    Creatinine 0.78 04/09/2015 08:11 AM    BUN 22 04/09/2015 08:11 AM    Sodium 140 04/09/2015 08:11 AM    Potassium 3.9 04/09/2015 08:11 AM    Chloride 103 04/09/2015 08:11 AM    CO2 29 04/09/2015 08:11 AM            Assessment / Plan     Hypertension well controlled, on Coreg and lisinopril. Pt to make journal of when her BP is low so we can see what may be triggering it.   Hyperlipidemia reasonably well controlled, on Crestor      ICD-10-CM ICD-9-CM    1. Routine general medical examination at a health care facility Z00.00 V70.0    2. Screening for alcoholism Z13.89 V79.1    3. Postmenopausal Z78.0 V49.81 DEXA BONE DENSITY STUDY AXIAL   4. Essential hypertension W09 811.9 METABOLIC PANEL, COMPREHENSIVE   5. High cholesterol E78.00 272.0 LIPID PANEL   6. Cardiomyopathy ([place]) I42.9 425.4 Stable on current meds. Pt is followed by Cardiology.    7. Non morbid obesity due to excess calories E66.09 278.00 Will treat with naltrexone-buPROPion (CONTRAVE) 8-90 mg TbER ER tablet along with diet and exercise.    8. Encounter for immunization Z23 V03.89 PNEUMOCOCCAL CONJ VACCINE 13 VALENT IM       ADMIN PNEUMOCOCCAL VACCINE                 Low cholesterol diet, weight control and  daily exercise discussed.     Follow-up Disposition:  Return in about 3 months (around 07/26/2015) for labs 1 week before.      Reviewed plan of care. Patient has provided input and agrees with goals.

## 2015-05-05 NOTE — Telephone Encounter (Signed)
Pt c/o cough and wheezing. She will try Robitussin AC she has and if not improving will go to urgent care.

## 2015-05-06 ENCOUNTER — Inpatient Hospital Stay
Admit: 2015-05-06 | Discharge: 2015-05-06 | Disposition: A | Discharge status: Home / Self Care | Payer: MEDICARE | Attending: Emergency Medicine

## 2015-05-06 ENCOUNTER — Emergency Department: Admit: 2015-05-06 | Payer: MEDICARE | Primary: Family Medicine

## 2015-05-06 DIAGNOSIS — J209 Acute bronchitis, unspecified: Secondary | ICD-10-CM

## 2015-05-06 LAB — CBC WITH AUTOMATED DIFF
ABS. BASOPHILS: 0 10*3/uL (ref 0.0–0.06)
ABS. EOSINOPHILS: 0 10*3/uL (ref 0.0–0.4)
ABS. LYMPHOCYTES: 1 10*3/uL (ref 0.9–3.6)
ABS. MONOCYTES: 0.4 10*3/uL (ref 0.05–1.2)
ABS. NEUTROPHILS: 2.2 10*3/uL (ref 1.8–8.0)
BASOPHILS: 1 % (ref 0–2)
EOSINOPHILS: 1 % (ref 0–5)
HCT: 37.4 % (ref 35.0–45.0)
HGB: 11.8 g/dL — ABNORMAL LOW (ref 12.0–16.0)
LYMPHOCYTES: 26 % (ref 21–52)
MCH: 29.9 PG (ref 24.0–34.0)
MCHC: 31.6 g/dL (ref 31.0–37.0)
MCV: 94.7 FL (ref 74.0–97.0)
MONOCYTES: 11 % — ABNORMAL HIGH (ref 3–10)
MPV: 11.1 FL (ref 9.2–11.8)
NEUTROPHILS: 61 % (ref 40–73)
PLATELET: 180 10*3/uL (ref 135–420)
RBC: 3.95 M/uL — ABNORMAL LOW (ref 4.20–5.30)
RDW: 13.2 % (ref 11.6–14.5)
WBC: 3.6 10*3/uL — ABNORMAL LOW (ref 4.6–13.2)

## 2015-05-06 LAB — EKG, 12 LEAD, INITIAL
Atrial Rate: 83 {beats}/min
Calculated P Axis: 50 degrees
Calculated R Axis: -12 degrees
Calculated T Axis: 133 degrees
Diagnosis: NORMAL
P-R Interval: 182 ms
Q-T Interval: 406 ms
QRS Duration: 110 ms
QTC Calculation (Bezet): 477 ms
Ventricular Rate: 83 {beats}/min

## 2015-05-06 LAB — METABOLIC PANEL, BASIC
Anion gap: 8 mmol/L (ref 3.0–18)
BUN/Creatinine ratio: 22 — ABNORMAL HIGH (ref 12–20)
BUN: 16 MG/DL (ref 7.0–18)
CO2: 31 mmol/L (ref 21–32)
Calcium: 8.8 MG/DL (ref 8.5–10.1)
Chloride: 105 mmol/L (ref 100–108)
Creatinine: 0.72 MG/DL (ref 0.6–1.3)
GFR est AA: >60 mL/min/{1.73_m2} (ref >60)
GFR est non-AA: >60 mL/min/{1.73_m2} (ref >60)
Glucose: 112 mg/dL — ABNORMAL HIGH (ref 74–99)
Potassium: 3.9 mmol/L (ref 3.5–5.5)
Sodium: 144 mmol/L (ref 136–145)

## 2015-05-06 LAB — CARDIAC PANEL,(CK, CKMB & TROPONIN)
CK - MB: <1 ng/ml (ref <3.6)
CK: 40 U/L (ref 26–192)
Troponin-I, QT: <0.02 NG/ML (ref 0.00–0.06)

## 2015-05-06 MED ORDER — PREDNISONE 20 MG TAB
20 mg | ORAL_TABLET | Freq: Every day | ORAL | 0 refills | Status: AC
Start: 2015-05-06 — End: 2015-05-09

## 2015-05-06 MED ORDER — PREDNISONE 20 MG TAB
20 mg | Freq: Once | ORAL | Status: AC
Start: 2015-05-06 — End: 2015-05-06
  Administered 2015-05-06: 10:00:00 via ORAL

## 2015-05-06 MED ORDER — AZITHROMYCIN 250 MG TAB
250 mg | ORAL_TABLET | ORAL | 0 refills | Status: DC
Start: 2015-05-06 — End: 2015-05-11

## 2015-05-06 MED ORDER — BENZONATATE 100 MG CAP
100 mg | ORAL_CAPSULE | Freq: Three times a day (TID) | ORAL | 0 refills | Status: DC | PRN
Start: 2015-05-06 — End: 2015-05-11

## 2015-05-06 MED ORDER — ALBUTEROL SULFATE HFA 90 MCG/ACTUATION AEROSOL INHALER
90 mcg/actuation | RESPIRATORY_TRACT | 0 refills | Status: DC | PRN
Start: 2015-05-06 — End: 2016-07-16

## 2015-05-06 MED ORDER — GUAIFENESIN 100 MG/5 ML ORAL LIQUID
100 mg/5 mL | ORAL | Status: AC
Start: 2015-05-06 — End: 2015-05-06
  Administered 2015-05-06: 10:00:00 via ORAL

## 2015-05-06 MED ORDER — IPRATROPIUM-ALBUTEROL 2.5 MG-0.5 MG/3 ML NEB SOLUTION
2.5 mg-0.5 mg/3 ml | RESPIRATORY_TRACT | Status: AC
Start: 2015-05-06 — End: 2015-05-06
  Administered 2015-05-06: 10:00:00 via RESPIRATORY_TRACT

## 2015-05-06 MED ORDER — ALBUTEROL SULFATE 0.083 % (0.83 MG/ML) SOLN FOR INHALATION
2.5 mg /3 mL (0.083 %) | RESPIRATORY_TRACT | Status: AC
Start: 2015-05-06 — End: 2015-05-06
  Administered 2015-05-06: 10:00:00 via RESPIRATORY_TRACT

## 2015-05-06 MED FILL — ALBUTEROL SULFATE 0.083 % (0.83 MG/ML) SOLN FOR INHALATION: 2.5 mg /3 mL (0.083 %) | RESPIRATORY_TRACT | Qty: 1

## 2015-05-06 MED FILL — GUAIFENESIN 100 MG/5 ML ORAL LIQUID: 100 mg/5 mL | ORAL | Qty: 10

## 2015-05-06 MED FILL — PREDNISONE 20 MG TAB: 20 mg | ORAL | Qty: 3

## 2015-05-06 MED FILL — IPRATROPIUM-ALBUTEROL 2.5 MG-0.5 MG/3 ML NEB SOLUTION: 2.5 mg-0.5 mg/3 ml | RESPIRATORY_TRACT | Qty: 3

## 2015-05-06 NOTE — ED Triage Notes (Signed)
Patient c/o cough x 3 days. Patient states that chest pain started hurting continuously last night.  Patient states that she called her physician, and he told her to come to the ER if tussinex did not work after 24 hours. Patient does not appear to be in any distress. Will continue to monitor.

## 2015-05-06 NOTE — ED Provider Notes (Signed)
Patient is a 65 y.o. female presenting with cough. The history is provided by the patient.   Cough   The current episode started more than 2 days ago. The problem has been gradually worsening. There has been no fever. Associated symptoms include chest pain and wheezing. Pertinent negatives include no chills, no rhinorrhea, no sore throat, no myalgias and no vomiting. She has tried cough syrup for the symptoms. The treatment provided no relief. She is not a smoker. Her past medical history is significant for bronchitis and heart failure.    Pt was being treated for the cough with tussionex by her PCP who according to pt, advised her to go to the ER if not getting better. She associates the cp with the cough a few days ago, but cp has now been constant since yesterday evening. No radiating cp or sweats. No other complaints.     Past Medical History:   Diagnosis Date   ??? Automatic implantable cardiac defibrillator in situ     Post ddd icd Set up [facility]   ??? Breast mass, left     benign   ??? CHF (congestive heart failure) ([facility]) 06/23/2008    Non-ischemic CMP , Cath (2008) Normal coronary arteries   ??? Chronic systolic heart failure (HCC)     Stable,    ??? Degenerative arthritis of left knee    ??? Fibroid 06/23/2008   ??? HLD (hyperlipidemia)    ??? HTN (hypertension) 06/23/2008   ??? Hypotension, unspecified     Related to lisinopril   ??? 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 Left knee pain     With swelling   ??? Leg pain    ??? 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 OSA (obstructive sleep apnea) 9/10   ??? Venous reflux    ??? Wears glasses        Past Surgical History:   Procedure Laterality Date   ??? HX BREAST BIOPSY      rt breast, benign   ??? HX CHOLECYSTECTOMY     ??? HX CRANIOTOMY  2013    meningioma    ??? HX HEENT      glasses   ??? HX PACEMAKER      dual chamber icd   ??? HX TONSIL AND ADENOIDECTOMY     ??? HX TOTAL ABDOMINAL HYSTERECTOMY  1996   ??? HX TUBAL LIGATION            Family History:   Problem Relation Age of Onset   ??? Diabetes Mother    ??? Heart Attack Father      MI   ??? Heart Disease Maternal Grandmother    ??? Heart Disease Paternal Grandmother    ??? Kidney Disease Other      1 sib deceased   ??? Cancer Other      1 sib throat cancer   ??? Diabetes Other    ??? Diabetes Daughter    ??? Other Daughter      leukemia   ??? Hypertension Other        Social History     Social History   ??? Marital status: MARRIED     Spouse name: N/A   ??? Number of children: N/A   ??? 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  Smoking status: Never Smoker   ??? Smokeless tobacco: Never Used   ??? Alcohol use No   ??? Drug use: No   ??? Sexual activity: Yes     Partners: Male     Birth control/ protection: Condom     Other Topics Concern   ??? Not on file     Social History Narrative         ALLERGIES: Tramadol; Penicillamine; and Pcn [penicillins]    Review of Systems   Constitutional: Negative for chills and fever.   HENT: Positive for congestion. Negative for drooling, rhinorrhea, sore throat and trouble swallowing.    Respiratory: Positive for cough, chest tightness and wheezing.    Cardiovascular: Positive for chest pain. Negative for palpitations.   Gastrointestinal: Negative.  Negative for vomiting.   Genitourinary: Negative for difficulty urinating and dysuria.   Musculoskeletal: Negative for arthralgias and myalgias.   Skin: Negative for pallor.   Neurological: Negative for dizziness and weakness.   Psychiatric/Behavioral: Negative for agitation.   All other systems reviewed and are negative.      Vitals:    05/06/15 0424 05/06/15 0426 05/06/15 0500 05/06/15 0530   BP: 129/66  114/60 116/59   Pulse: 80  77 81   Resp: 16  17 21    Temp: 98.2 ??F (36.8 ??C)      SpO2: 97% 96% 97% 98%   Weight: 90.3 kg (199 lb)               Physical Exam   Constitutional: She is oriented to person, place, and time. She appears well-developed and well-nourished. No distress.    HENT:   Head: Normocephalic and atraumatic.   Mouth/Throat: Oropharynx is clear and moist.   Eyes: Conjunctivae and EOM are normal.   Neck: Normal range of motion. Neck supple.   Cardiovascular: Normal rate and normal heart sounds.    No murmur heard.  Pulmonary/Chest: Effort normal. She has wheezes. She has no rales. She exhibits tenderness.   Reproducible anterior chest wall tenderness   Abdominal: Soft. Bowel sounds are normal. She exhibits no distension. There is no tenderness.   Musculoskeletal: Normal range of motion. She exhibits no edema.   Neurological: She is alert and oriented to person, place, and time. She exhibits normal muscle tone.   Skin: Skin is warm and dry. She is not diaphoretic.   Psychiatric: She has a normal mood and affect.   Nursing note and vitals reviewed.       MDM  Number of Diagnoses or Management Options  Acute bronchitis, unspecified organism:   Diagnosis management comments: ddx bronchitis, PNA, URI, cardiac, chf, musculoskeletal    Labs, nebs, steroid, robitussin, ekg, CxR    I have reassessed the patient. I have discussed the workup, results and plan with the patient and patient is in agreement.  Patient is feeling better.  Patient will be prescribed zpak, prednisone, albuterol inhaler.  Patient was discharge in stable condition. Patient was given outpatient follow up.  Patient is to return to emergency department if any new or worsening condition. 5:38 AM May 06, 2015           Amount and/or Complexity of Data Reviewed  Clinical lab tests: ordered and reviewed  Tests in the radiology section of CPT??: ordered and reviewed  Tests in the medicine section of CPT??: ordered and reviewed    Risk of Complications, Morbidity, and/or Mortality  Presenting problems: moderate  Diagnostic procedures: moderate  Management options: moderate  Patient Progress  Patient progress: improved    ED Course       Procedures      Vitals:  Patient Vitals for the past 12 hrs:    Temp Pulse Resp BP SpO2   05/06/15 0530 - 81 21 116/59 98 %   05/06/15 0500 - 77 17 114/60 97 %   05/06/15 0426 - - - - 96 %   05/06/15 0424 98.2 ??F (36.8 ??C) 80 16 129/66 97 %         Medications ordered:   Medications   albuterol (PROVENTIL VENTOLIN) nebulizer solution 2.5 mg (2.5 mg Nebulization Given 05/06/15 0439)   guaiFENesin (ROBITUSSIN) 100 mg/5 mL oral liquid 200 mg (200 mg Oral Given 05/06/15 0431)   albuterol-ipratropium (DUO-NEB) 2.5 MG-0.5 MG/3 ML (3 mL Nebulization Given 05/06/15 0515)   predniSONE (DELTASONE) tablet 60 mg (60 mg Oral Given 05/06/15 0515)         Lab findings:  Recent Results (from the past 12 hour(s))   EKG, 12 LEAD, INITIAL    Collection Time: 05/06/15  3:20 AM   Result Value Ref Range    Ventricular Rate 83 BPM    Atrial Rate 83 BPM    P-R Interval 182 ms    QRS Duration 110 ms    Q-T Interval 406 ms    QTC Calculation (Bezet) 477 ms    Calculated P Axis 50 degrees    Calculated R Axis -12 degrees    Calculated T Axis 133 degrees    Diagnosis       Normal sinus rhythm  Possible Left atrial enlargement  Incomplete left bundle branch block  Left ventricular hypertrophy with repolarization abnormality  Abnormal ECG  When compared with ECG of 13-Aug-2014 17:56,  Nonspecific T wave abnormality has replaced inverted T waves in Anterior   leads     CBC WITH AUTOMATED DIFF    Collection Time: 05/06/15  4:30 AM   Result Value Ref Range    WBC 3.6 (L) 4.6 - 13.2 K/uL    RBC 3.95 (L) 4.20 - 5.30 M/uL    HGB 11.8 (L) 12.0 - 16.0 g/dL    HCT 37.4 35.0 - 45.0 %    MCV 94.7 74.0 - 97.0 FL    MCH 29.9 24.0 - 34.0 PG    MCHC 31.6 31.0 - 37.0 g/dL    RDW 13.2 11.6 - 14.5 %    PLATELET 180 135 - 420 K/uL    MPV 11.1 9.2 - 11.8 FL    NEUTROPHILS 61 40 - 73 %    LYMPHOCYTES 26 21 - 52 %    MONOCYTES 11 (H) 3 - 10 %    EOSINOPHILS 1 0 - 5 %    BASOPHILS 1 0 - 2 %    ABS. NEUTROPHILS 2.2 1.8 - 8.0 K/UL    ABS. LYMPHOCYTES 1.0 0.9 - 3.6 K/UL    ABS. MONOCYTES 0.4 0.05 - 1.2 K/UL     ABS. EOSINOPHILS 0.0 0.0 - 0.4 K/UL    ABS. BASOPHILS 0.0 0.0 - 0.06 K/UL    DF AUTOMATED     METABOLIC PANEL, BASIC    Collection Time: 05/06/15  4:30 AM   Result Value Ref Range    Sodium 144 136 - 145 mmol/L    Potassium 3.9 3.5 - 5.5 mmol/L    Chloride 105 100 - 108 mmol/L    CO2 31 21 - 32 mmol/L    Anion gap 8  3.0 - 18 mmol/L    Glucose 112 (H) 74 - 99 mg/dL    BUN 16 7.0 - 18 MG/DL    Creatinine 0.72 0.6 - 1.3 MG/DL    BUN/Creatinine ratio 22 (H) 12 - 20      GFR est AA >60 >60 ml/min/1.31m2    GFR est non-AA >60 >60 ml/min/1.52m2    Calcium 8.8 8.5 - 10.1 MG/DL   CARDIAC PANEL,(CK, CKMB & TROPONIN)    Collection Time: 05/06/15  4:30 AM   Result Value Ref Range    CK 40 26 - 192 U/L    CK - MB <1.0 <3.6 ng/ml    CK-MB Index Cannot be calulated 0.0 - 4.0 %    Troponin-I, Qt. <0.02 0.00 - 0.06 NG/ML       EKG interpretation by ED Physician:  4:20AM is correct time, not 3:20am as is on EKG; daylight savings time had already occurred.  NSR, rate 83, no STE, has t wave inversion leads I, avL, V4-V6. This is similar to multiple old EKG's.     X-Ray, CT or other radiology findings or impressions:  CxR: no acute process per Dr. Ozella Almond prelim.        Disposition:  Diagnosis:   1. Acute bronchitis, unspecified organism        Disposition: discharged    Follow-up Information     Follow up With Details Comments [place], MD Call in 1 day  [place] [id number]  [phone]      [facility]  As needed, If symptoms worsen [place] [phone]  [phone]           Patient's Medications   Start Taking    ALBUTEROL (PROVENTIL HFA, VENTOLIN HFA, PROAIR HFA) 90 MCG/ACTUATION INHALER    Take 2 Puffs by inhalation every four (4) hours as needed for Wheezing or Shortness of Breath. Indications: BRONCHOSPASM PREVENTION    AZITHROMYCIN (ZITHROMAX Z-PAK) 250 MG TABLET    Take as written     BENZONATATE (TESSALON PERLES) 100 MG CAPSULE    Take 2 Caps by mouth three (3) times daily as needed for Cough for up to 7 days. Indications: COUGH    PREDNISONE (DELTASONE) 20 MG TABLET    Take 2 Tabs by mouth daily for 3 days. Start this medication on Monday 05/07/15. Take With Breakfast   Continue Taking    CARVEDILOL (COREG) 3.125 MG TABLET    Take 1 Tab by mouth two (2) times a day.    CRESTOR 5 MG TABLET    TAKE ONE TABLET BY MOUTH EVERY EVENING    FLUTICASONE (FLONASE) 50 MCG/ACTUATION NASAL SPRAY    2 Sprays by Both Nostrils route daily.    FUROSEMIDE (LASIX) 20 MG TABLET    Take 1 Tab by mouth daily.    LISINOPRIL (PRINIVIL, ZESTRIL) 5 MG TABLET    Take 1 Tab by mouth daily.    NALTREXONE-BUPROPION (CONTRAVE) 8-90 MG TBER ER TABLET    First Month: Contrave 8mg /90mg  #70    Week 1 : 1 Tab AM  Week 2 : 1 Tab AM, 1 Tab PM  Week 3 : 2 Tab AM, 1 Tab PM  Week 4 : 2 Tab AM, 2 Tab PM  Indications: WEIGHT LOSS MANAGEMENT FOR OBESE PATIENT (BMI >= 30)   These Medications have changed    No medications on file   Stop  Taking    ALBUTEROL (PROVENTIL HFA, VENTOLIN HFA, PROAIR HFA) 90 MCG/ACTUATION INHALER    Take 2 Puffs by inhalation every four (4) hours as needed for Wheezing or Shortness of Breath (Cough).    INHALATIONAL SPACING DEVICE    ALWAYS USE WITH INHALER    MELOXICAM (MOBIC) 15 MG TABLET    Take 1 Tab by mouth daily (with breakfast).

## 2015-05-06 NOTE — ED Notes (Signed)
Patient states that she "is feeling a little bit better, and is coughing less". Physician informed. Will continue to monitor.

## 2015-05-06 NOTE — ED Notes (Signed)
Discharge instructions reviewed with patient.  Patient voices understanding.  Patient advised to follow up as directed on discharge instructions.  Patient denies questions, needs or concerns at discharge regarding discharge instructions. Advised patient of need to update demographic information with registration prior to departing ER.  Patient voiced understanding.   No s/sx of distress noted.  Armband removed and placed in shredder box.

## 2015-05-07 NOTE — Progress Notes (Signed)
NN attempted to contact patient at listed number. VM left identifying self, direct contact information given with request for return call.  Plan: should NN contact patient, explain NN role/ Reason for this outreach. Perform discharge assessment, medication reconciliation and assist patient in scheduling follow up appointment with Dr. Madelyn Brunner.    Elwanda Brooklyn, RN / Virtual Nurse Navigator

## 2015-05-10 ENCOUNTER — Inpatient Hospital Stay: Admit: 2015-05-10 | Payer: MEDICARE | Attending: Internal Medicine | Primary: Family Medicine

## 2015-05-10 DIAGNOSIS — Z13820 Encounter for screening for osteoporosis: Secondary | ICD-10-CM

## 2015-05-10 MED ORDER — CODEINE-GUAIFENESIN 10 MG-100 MG/5 ML ORAL LIQUID
100-10 mg/5 mL | Freq: Four times a day (QID) | ORAL | 0 refills | Status: DC | PRN
Start: 2015-05-10 — End: 2015-05-24

## 2015-05-10 NOTE — Progress Notes (Signed)
Pt notified.  Scheduled to come in on 3/30

## 2015-05-10 NOTE — Telephone Encounter (Signed)
If the cough is still persistent can give her a different cough medicine like Cheratussin AC. Ok to call it in.

## 2015-05-10 NOTE — Telephone Encounter (Addendum)
pts husband Graylin Jalloh calling re: pt went to [facility] ED 05/06/2015 and was DX with bronchitis and given several medications     ED advised to f/u with pcp as needed     He said she is still having cough, had stopped yesterday morning and started again last night.    He said the meds are not helping, he asked why does she need to see her pcp when she has already been DX from ED. He was concerned re expense

## 2015-05-10 NOTE — Progress Notes (Signed)
Have pt set up appt to go over bone density

## 2015-05-11 MED ORDER — BENZONATATE 100 MG CAP
100 mg | ORAL_CAPSULE | Freq: Three times a day (TID) | ORAL | 0 refills | Status: AC | PRN
Start: 2015-05-11 — End: 2015-05-18

## 2015-05-11 MED ORDER — LEVOFLOXACIN 500 MG TAB
500 mg | ORAL_TABLET | Freq: Every day | ORAL | 0 refills | Status: AC
Start: 2015-05-11 — End: 2015-05-18

## 2015-05-11 NOTE — Telephone Encounter (Signed)
Spoke with pharmacist and gave her verbal for Cheratussin.

## 2015-05-11 NOTE — Telephone Encounter (Signed)
Pt is feeling better but still has cough that is productive of brown mucus. Will send in Jasper and Emma since insurance does not pay for Cheratussin AC. Only to use Levaquin if pt not getting better over weekend since husband concerned for her getting pneumonia.

## 2015-05-11 NOTE — Telephone Encounter (Signed)
Pt aware medication has been called in to [place]

## 2015-05-11 NOTE — Telephone Encounter (Signed)
Tried to call patient's husband at number provided. The phone was picked up but no one was on the other end.  Called and left a message on cell number for patient to call the office.

## 2015-05-14 MED ORDER — FUROSEMIDE 20 MG TAB
20 mg | ORAL_TABLET | ORAL | 5 refills | Status: DC
Start: 2015-05-14 — End: 2015-08-02

## 2015-05-14 MED ORDER — LISINOPRIL 5 MG TAB
5 mg | ORAL_TABLET | ORAL | 5 refills | Status: DC
Start: 2015-05-14 — End: 2015-11-07

## 2015-05-14 MED ORDER — CARVEDILOL 3.125 MG TAB
3.125 mg | ORAL_TABLET | ORAL | 5 refills | Status: DC
Start: 2015-05-14 — End: 2015-11-07

## 2015-05-24 ENCOUNTER — Ambulatory Visit: Admit: 2015-05-24 | Discharge: 2015-05-24 | Payer: MEDICARE | Attending: Internal Medicine | Primary: Family Medicine

## 2015-05-24 DIAGNOSIS — M810 Age-related osteoporosis without current pathological fracture: Secondary | ICD-10-CM

## 2015-05-24 MED ORDER — CODEINE-GUAIFENESIN 10 MG-100 MG/5 ML ORAL LIQUID
100-10 mg/5 mL | Freq: Four times a day (QID) | ORAL | 0 refills | Status: DC | PRN
Start: 2015-05-24 — End: 2015-07-03

## 2015-05-24 MED ORDER — NALTREXONE 8 MG-BUPROPION 90 MG TABLET,EXTENDED RELEASE
8-90 mg | ORAL_TABLET | ORAL | 3 refills | Status: DC
Start: 2015-05-24 — End: 2015-06-05

## 2015-05-24 NOTE — Progress Notes (Signed)
Patient is in the office today for a ED follow up, and Bone Density results.      1. Have you been to the ER, urgent care clinic since your last visit?  Hospitalized since your last visit?yes, [facility].    2. Have you seen or consulted any other health care providers outside of the [facility] since your last visit?  Include any pap smears or colon screening. No

## 2015-05-24 NOTE — Progress Notes (Signed)
Cathy [name] is a 65 y.o.  female and presents with ED Follow-up and Osteoporosis      SUBJECTIVE:  Pt has osteoporosis by DEXA scan so will start her on Calcium with vit D  600 mg BID and see if her insurance will pay for Prolia. If they do not will consider Fosamax. Pt has tolerated Contrave and has actually lost some weight within the last month. The cost of the medication will make it difficult for her to continue the medication. Pt has recovered from recent cold and is still occasionally using Cheratussin. She knows not to use Contrave with Cheratussin AC    ROS: Respiratory denies any shortness of breath     Current Outpatient Prescriptions   Medication Sig   ??? guaiFENesin-codeine (CHERATUSSIN AC) 100-10 mg/5 mL solution Take 10 mL by mouth four (4) times daily as needed for Cough. Max Daily Amount: 40 mL.   ??? calcium-vitamin D (CALCIUM 600 + D,3,) 600 mg(1,500mg ) -200 unit tab Take 1 Tab by mouth two (2) times daily (with meals).   ??? naltrexone-buPROPion (CONTRAVE) 8-90 mg TbER ER tablet Week 5 and Beyond: Contrave 8mg /90mg  #120   2 Tab AM, 2 Tab PM  Indications: WEIGHT LOSS MANAGEMENT FOR OBESE PATIENT (BMI >= 30)   ??? lisinopril (PRINIVIL, ZESTRIL) 5 mg tablet TAKE ONE TABLET BY MOUTH DAILY   ??? carvedilol (COREG) 3.125 mg tablet TAKE ONE TABLET BY MOUTH TWICE A DAY   ??? furosemide (LASIX) 20 mg tablet TAKE ONE TABLET BY MOUTH DAILY   ??? CRESTOR 5 mg tablet TAKE ONE TABLET BY MOUTH EVERY EVENING   ??? albuterol (PROVENTIL HFA, VENTOLIN HFA, PROAIR HFA) 90 mcg/actuation inhaler Take 2 Puffs by inhalation every four (4) hours as needed for Wheezing or Shortness of Breath. Indications: BRONCHOSPASM PREVENTION   ??? fluticasone (FLONASE) 50 mcg/actuation nasal spray 2 Sprays by Both Nostrils route daily. (Patient taking differently: 2 Sprays by Both Nostrils route daily. As needed)     No current facility-administered medications for this visit.          OBJECTIVE:   alert, well appearing, and in no distress  Visit Vitals   ??? BP 106/56 (BP 1 Location: Left arm, BP Patient Position: Sitting)   ??? Pulse 82   ??? Temp 98.2 ??F (36.8 ??C) (Oral)   ??? Resp 18   ??? Ht 5\' 9"  (1.753 m)   ??? Wt 196 lb (88.9 kg)   ??? SpO2 100%   ??? BMI 28.94 kg/m2      well developed and well nourished        Assessment/Plan      ICD-10-CM ICD-9-CM    1. Osteoporosis M81.0 733.00 Will start on calcium-vitamin D (CALCIUM 600 + D,3,) 600 mg(1,500mg ) -200 unit tab BID and start pt on Prolia if insurance pays for the medication.   2. Non morbid obesity due to excess calories E66.09 278.00 Pt losing weight well with Contrave.      Follow-up Disposition:  Return if symptoms worsen or fail to improve.    Reviewed plan of care. Patient has provided input and agrees with goals.

## 2015-05-24 NOTE — Patient Instructions (Signed)
A Healthy Lifestyle: Care Instructions  Your Care Instructions  A healthy lifestyle can help you feel good, stay at a healthy weight, and have plenty of energy for both work and play. A healthy lifestyle is something you can share with your whole family.  A healthy lifestyle also can lower your risk for serious health problems, such as high blood pressure, heart disease, and diabetes.  You can follow a few steps listed below to improve your health and the health of your family.  Follow-up care is a key part of your treatment and safety. Be sure to make and go to all appointments, and call your doctor if you are having problems. It???s also a good idea to know your test results and keep a list of the medicines you take.  How can you care for yourself at home?  ?? Do not eat too much sugar, fat, or fast foods. You can still have dessert and treats now and then. The goal is moderation.  ?? Start small to improve your eating habits. Pay attention to portion sizes, drink less juice and soda pop, and eat more fruits and vegetables.  ?? Eat a healthy amount of food. A 3-ounce serving of meat, for example, is about the size of a deck of cards. Fill the rest of your plate with vegetables and whole grains.  ?? Limit the amount of soda and sports drinks you have every day. Drink more water when you are thirsty.  ?? Eat at least 5 servings of fruits and vegetables every day. It may seem like a lot, but it is not hard to reach this goal. A serving or helping is 1 piece of fruit, 1 cup of vegetables, or 2 cups of leafy, raw vegetables. Have an apple or some carrot sticks as an afternoon snack instead of a candy bar. Try to have fruits and/or vegetables at every meal.  ?? Make exercise part of your daily routine. You may want to start with simple activities, such as walking, bicycling, or slow swimming. Try to be active 30 to 60 minutes every day. You do not need to do all 30 to 60  minutes all at once. For example, you can exercise 3 times a day for 10 or 20 minutes. Moderate exercise is safe for most people, but it is always a good idea to talk to your doctor before starting an exercise program.  ?? Keep moving. Mow the lawn, work in the garden, or clean your house. Take the stairs instead of the elevator at work.  ?? If you smoke, quit. People who smoke have an increased risk for heart attack, stroke, cancer, and other lung illnesses. Quitting is hard, but there are ways to boost your chance of quitting tobacco for good.  ?? Use nicotine gum, patches, or lozenges.  ?? Ask your doctor about stop-smoking programs and medicines.  ?? Keep trying.  In addition to reducing your risk of diseases in the future, you will notice some benefits soon after you stop using tobacco. If you have shortness of breath or asthma symptoms, they will likely get better within a few weeks after you quit.  ?? Limit how much alcohol you drink. Moderate amounts of alcohol (up to 2 drinks a day for men, 1 drink a day for women) are okay. But drinking too much can lead to liver problems, high blood pressure, and other health problems.  Family health  If you have a family, there are many things you can do   together to improve your health.  ?? Eat meals together as a family as often as possible.  ?? Eat healthy foods. This includes fruits, vegetables, lean meats and dairy, and whole grains.  ?? Include your family in your fitness plan. Most people think of activities such as jogging or tennis as the way to fitness, but there are many ways you and your family can be more active. Anything that makes you breathe hard and gets your heart pumping is exercise. Here are some tips:  ?? Walk to do errands or to take your child to school or the bus.  ?? Go for a family bike ride after dinner instead of watching TV.  Where can you learn more?  Go to http://www.healthwise.net/[employer].   Enter U807 in the search box to learn more about "A Healthy Lifestyle: Care Instructions."  Current as of: September 19, 2014  Content Version: 11.2  ?? 2006-2017 [employer]. Care instructions adapted under license by [employer] (which disclaims liability or warranty for this information). If you have questions about a medical condition or this instruction, always ask your healthcare professional. Healthwise, Inc[employer] disclaims any warranty or liability for your use of this information.

## 2015-06-06 MED ORDER — NALTREXONE 8 MG-BUPROPION 90 MG TABLET,EXTENDED RELEASE
8-90 mg | ORAL_TABLET | ORAL | 3 refills | Status: DC
Start: 2015-06-06 — End: 2015-07-03

## 2015-06-14 MED ORDER — CRESTOR 5 MG TABLET
5 mg | ORAL_TABLET | ORAL | 1 refills | Status: DC
Start: 2015-06-14 — End: 2015-12-14

## 2015-06-30 ENCOUNTER — Ambulatory Visit (INDEPENDENT_AMBULATORY_CARE_PROVIDER_SITE_OTHER): Payer: Medicare Other | Admitting: Internal Medicine

## 2015-06-30 ENCOUNTER — Ambulatory Visit (INDEPENDENT_AMBULATORY_CARE_PROVIDER_SITE_OTHER): Payer: Medicare Other

## 2015-06-30 ENCOUNTER — Emergency Department (HOSPITAL_COMMUNITY): Payer: Medicare Other

## 2015-06-30 ENCOUNTER — Encounter (HOSPITAL_COMMUNITY): Payer: Self-pay | Admitting: Emergency Medicine

## 2015-06-30 ENCOUNTER — Inpatient Hospital Stay (HOSPITAL_COMMUNITY)
Admission: EM | Admit: 2015-06-30 | Discharge: 2015-07-02 | DRG: 293 | Disposition: A | Discharge status: Home / Self Care | Payer: Medicare Other | Attending: Oncology | Admitting: Oncology

## 2015-06-30 VITALS — BP 122/70 | HR 104 | Temp 98.0°F | Resp 20

## 2015-06-30 DIAGNOSIS — R0789 Other chest pain: Secondary | ICD-10-CM

## 2015-06-30 DIAGNOSIS — I110 Hypertensive heart disease with heart failure: Secondary | ICD-10-CM | POA: Diagnosis present

## 2015-06-30 DIAGNOSIS — Z8249 Family history of ischemic heart disease and other diseases of the circulatory system: Secondary | ICD-10-CM

## 2015-06-30 DIAGNOSIS — I509 Heart failure, unspecified: Secondary | ICD-10-CM

## 2015-06-30 DIAGNOSIS — R918 Other nonspecific abnormal finding of lung field: Secondary | ICD-10-CM

## 2015-06-30 DIAGNOSIS — R0602 Shortness of breath: Secondary | ICD-10-CM | POA: Diagnosis present

## 2015-06-30 DIAGNOSIS — Z880 Allergy status to penicillin: Secondary | ICD-10-CM | POA: Diagnosis not present

## 2015-06-30 DIAGNOSIS — I5043 Acute on chronic combined systolic (congestive) and diastolic (congestive) heart failure: Secondary | ICD-10-CM

## 2015-06-30 DIAGNOSIS — I5022 Chronic systolic (congestive) heart failure: Secondary | ICD-10-CM | POA: Insufficient documentation

## 2015-06-30 DIAGNOSIS — J45909 Unspecified asthma, uncomplicated: Secondary | ICD-10-CM | POA: Diagnosis present

## 2015-06-30 DIAGNOSIS — M810 Age-related osteoporosis without current pathological fracture: Secondary | ICD-10-CM | POA: Diagnosis present

## 2015-06-30 DIAGNOSIS — Z79899 Other long term (current) drug therapy: Secondary | ICD-10-CM

## 2015-06-30 DIAGNOSIS — E669 Obesity, unspecified: Secondary | ICD-10-CM | POA: Diagnosis present

## 2015-06-30 DIAGNOSIS — R938 Abnormal findings on diagnostic imaging of other specified body structures: Secondary | ICD-10-CM

## 2015-06-30 DIAGNOSIS — E039 Hypothyroidism, unspecified: Secondary | ICD-10-CM | POA: Diagnosis present

## 2015-06-30 DIAGNOSIS — Z886 Allergy status to analgesic agent status: Secondary | ICD-10-CM | POA: Diagnosis not present

## 2015-06-30 DIAGNOSIS — N390 Urinary tract infection, site not specified: Secondary | ICD-10-CM

## 2015-06-30 DIAGNOSIS — Z6830 Body mass index (BMI) 30.0-30.9, adult: Secondary | ICD-10-CM

## 2015-06-30 DIAGNOSIS — Z95810 Presence of automatic (implantable) cardiac defibrillator: Secondary | ICD-10-CM | POA: Diagnosis not present

## 2015-06-30 DIAGNOSIS — E785 Hyperlipidemia, unspecified: Secondary | ICD-10-CM | POA: Diagnosis present

## 2015-06-30 DIAGNOSIS — J309 Allergic rhinitis, unspecified: Secondary | ICD-10-CM | POA: Diagnosis not present

## 2015-06-30 DIAGNOSIS — G4733 Obstructive sleep apnea (adult) (pediatric): Secondary | ICD-10-CM | POA: Diagnosis present

## 2015-06-30 DIAGNOSIS — R8281 Pyuria: Secondary | ICD-10-CM

## 2015-06-30 DIAGNOSIS — I5023 Acute on chronic systolic (congestive) heart failure: Secondary | ICD-10-CM | POA: Diagnosis not present

## 2015-06-30 HISTORY — DX: Heart failure, unspecified: I50.9

## 2015-06-30 HISTORY — DX: Other specified symptoms and signs involving the circulatory and respiratory systems: R09.89

## 2015-06-30 HISTORY — DX: Pure hypercholesterolemia, unspecified: E78.00

## 2015-06-30 HISTORY — DX: Abnormal findings on diagnostic imaging of heart and coronary circulation: R93.1

## 2015-06-30 HISTORY — DX: Bronchitis, not specified as acute or chronic: J40

## 2015-06-30 HISTORY — DX: Neoplasm of unspecified behavior of brain: D49.6

## 2015-06-30 LAB — AMB EXT CREATININE: Creatinine, External: 0.73

## 2015-06-30 LAB — POCT CBC
GRANULOCYTE PERCENT: 75.2 % (ref 37–80)
HCT, POC: 32.9 % — AB (ref 37.7–47.9)
Hemoglobin: 11.8 g/dL — AB (ref 12.2–16.2)
Lymph, poc: 1.4 (ref 0.6–3.4)
MCH, POC: 31.8 pg — AB (ref 27–31.2)
MCHC: 35.8 g/dL — AB (ref 31.8–35.4)
MCV: 88.9 fL (ref 80–97)
MID (cbc): 0.1 (ref 0–0.9)
MPV: 9.4 fL (ref 0–99.8)
PLATELET COUNT, POC: 169 10*3/uL (ref 142–424)
POC Granulocyte: 4.4 (ref 2–6.9)
POC LYMPH PERCENT: 22.9 %L (ref 10–50)
POC MID %: 1.9 %M (ref 0–12)
RBC: 3.7 M/uL — AB (ref 4.04–5.48)
RDW, POC: 14.8 %
WBC: 5.9 10*3/uL (ref 4.6–10.2)

## 2015-06-30 LAB — BASIC METABOLIC PANEL
ANION GAP: 11 (ref 5–15)
BUN: 11 mg/dL (ref 6–20)
CHLORIDE: 105 mmol/L (ref 101–111)
CO2: 26 mmol/L (ref 22–32)
CREATININE: 0.73 mg/dL (ref 0.44–1.00)
Calcium: 9.3 mg/dL (ref 8.9–10.3)
GFR calc non Af Amer: >60 mL/min (ref >60)
Glucose, Bld: 88 mg/dL (ref 65–99)
POTASSIUM: 4.7 mmol/L (ref 3.5–5.1)
Sodium: 142 mmol/L (ref 135–145)

## 2015-06-30 LAB — COMPREHENSIVE METABOLIC PANEL
ALBUMIN: 4.2 g/dL (ref 3.6–5.1)
ALT: 16 U/L (ref 6–29)
AST: 14 U/L (ref 10–35)
Alkaline Phosphatase: 88 U/L (ref 33–130)
BUN: 14 mg/dL (ref 7–25)
CO2: 30 mmol/L (ref 20–31)
Calcium: 9.2 mg/dL (ref 8.6–10.4)
Chloride: 105 mmol/L (ref 98–110)
Creat: 0.73 mg/dL (ref 0.50–0.99)
Glucose, Bld: 92 mg/dL (ref 65–99)
Potassium: 4.3 mmol/L (ref 3.5–5.3)
SODIUM: 143 mmol/L (ref 135–146)
Total Bilirubin: 0.8 mg/dL (ref 0.2–1.2)
Total Protein: 6.7 g/dL (ref 6.1–8.1)

## 2015-06-30 LAB — POC MICROSCOPIC URINALYSIS (UMFC): MUCUS RE: ABSENT

## 2015-06-30 LAB — POCT URINALYSIS DIP (MANUAL ENTRY)
BILIRUBIN UA: NEGATIVE
Bilirubin, UA: NEGATIVE
Glucose, UA: NEGATIVE
Nitrite, UA: NEGATIVE
PH UA: 7
PROTEIN UA: NEGATIVE
SPEC GRAV UA: 1.015
Urobilinogen, UA: 0.2

## 2015-06-30 LAB — I-STAT TROPONIN, ED: TROPONIN I, POC: 0 ng/mL (ref 0.00–0.08)

## 2015-06-30 LAB — CBC WITH DIFFERENTIAL/PLATELET
BASOS PCT: 0 %
Basophils Absolute: 0 10*3/uL (ref 0.0–0.1)
Eosinophils Absolute: 0 10*3/uL (ref 0.0–0.7)
Eosinophils Relative: 1 %
HEMATOCRIT: 35.6 % — AB (ref 36.0–46.0)
Hemoglobin: 11.4 g/dL — ABNORMAL LOW (ref 12.0–15.0)
Lymphocytes Relative: 25 %
Lymphs Abs: 1.3 10*3/uL (ref 0.7–4.0)
MCH: 30.2 pg (ref 26.0–34.0)
MCHC: 32 g/dL (ref 30.0–36.0)
MCV: 94.4 fL (ref 78.0–100.0)
MONO ABS: 0.3 10*3/uL (ref 0.1–1.0)
MONOS PCT: 6 %
NEUTROS ABS: 3.4 10*3/uL (ref 1.7–7.7)
Neutrophils Relative %: 68 %
Platelets: 212 10*3/uL (ref 150–400)
RBC: 3.77 MIL/uL — ABNORMAL LOW (ref 3.87–5.11)
RDW: 13.6 % (ref 11.5–15.5)
WBC: 5 10*3/uL (ref 4.0–10.5)

## 2015-06-30 LAB — TSH: TSH: 0.824 u[IU]/mL (ref 0.350–4.500)

## 2015-06-30 LAB — BRAIN NATRIURETIC PEPTIDE: B NATRIURETIC PEPTIDE 5: 957.9 pg/mL — AB (ref 0.0–100.0)

## 2015-06-30 LAB — TROPONIN I: Troponin I: <0.03 ng/mL (ref <0.031)

## 2015-06-30 MED ORDER — LISINOPRIL 5 MG PO TABS
5.0000 mg | ORAL_TABLET | Freq: Every day | ORAL | Status: DC
  Administered 2015-06-30 – 2015-07-01 (×2): 5 mg via ORAL
  Filled 2015-06-30 (×2): qty 1

## 2015-06-30 MED ORDER — FLUTICASONE PROPIONATE 50 MCG/ACT NA SUSP
2.0000 | Freq: Every day | NASAL | Status: DC
  Administered 2015-07-01 – 2015-07-02 (×2): 2 via NASAL
  Filled 2015-06-30: qty 16

## 2015-06-30 MED ORDER — ONDANSETRON HCL 4 MG/2ML IJ SOLN: 4.0000 mg | Freq: Four times a day (QID) | INTRAMUSCULAR | Status: DC | PRN

## 2015-06-30 MED ORDER — ZOLPIDEM TARTRATE 5 MG PO TABS
5.0000 mg | ORAL_TABLET | Freq: Once | ORAL | Status: AC
  Administered 2015-06-30: 5 mg via ORAL
  Filled 2015-06-30: qty 1

## 2015-06-30 MED ORDER — CARVEDILOL 3.125 MG PO TABS
3.1250 mg | ORAL_TABLET | Freq: Two times a day (BID) | ORAL | Status: DC
  Administered 2015-06-30 – 2015-07-02 (×4): 3.125 mg via ORAL
  Filled 2015-06-30 (×4): qty 1

## 2015-06-30 MED ORDER — SODIUM CHLORIDE 0.9% FLUSH
3.0000 mL | Freq: Two times a day (BID) | INTRAVENOUS | Status: DC
  Administered 2015-06-30 – 2015-07-02 (×4): 3 mL via INTRAVENOUS

## 2015-06-30 MED ORDER — FUROSEMIDE 10 MG/ML IJ SOLN
20.0000 mg | Freq: Two times a day (BID) | INTRAMUSCULAR | Status: DC
Start: 2015-07-01 — End: 2015-07-01
  Administered 2015-07-01: 20 mg via INTRAVENOUS
  Filled 2015-06-30: qty 2

## 2015-06-30 MED ORDER — SODIUM CHLORIDE 0.9% FLUSH
3.0000 mL | INTRAVENOUS | Status: DC | PRN
Start: 2015-06-30 — End: 2015-07-02

## 2015-06-30 MED ORDER — SODIUM CHLORIDE 0.9 % IV SOLN: 250.0000 mL | INTRAVENOUS | Status: DC | PRN

## 2015-06-30 MED ORDER — FUROSEMIDE 10 MG/ML IJ SOLN
40.0000 mg | Freq: Once | INTRAMUSCULAR | Status: AC
  Administered 2015-06-30: 40 mg via INTRAVENOUS
  Filled 2015-06-30: qty 4

## 2015-06-30 MED ORDER — ACETAMINOPHEN 325 MG PO TABS: 650.0000 mg | ORAL_TABLET | ORAL | Status: DC | PRN

## 2015-06-30 MED ORDER — ENOXAPARIN SODIUM 40 MG/0.4ML ~~LOC~~ SOLN: 40.0000 mg | SUBCUTANEOUS | Status: DC

## 2015-06-30 MED ORDER — ALBUTEROL SULFATE HFA 108 (90 BASE) MCG/ACT IN AERS: 2.0000 | INHALATION_SPRAY | RESPIRATORY_TRACT | Status: DC | PRN

## 2015-06-30 MED ORDER — ALBUTEROL SULFATE (2.5 MG/3ML) 0.083% IN NEBU: 2.5000 mg | INHALATION_SOLUTION | RESPIRATORY_TRACT | Status: DC | PRN

## 2015-06-30 MED ORDER — IOPAMIDOL (ISOVUE-370) INJECTION 76%
INTRAVENOUS | Status: AC
  Administered 2015-06-30: 100 mL
  Filled 2015-06-30: qty 100

## 2015-06-30 MED ORDER — ENOXAPARIN SODIUM 40 MG/0.4ML ~~LOC~~ SOLN
40.0000 mg | SUBCUTANEOUS | Status: DC
  Administered 2015-06-30 – 2015-07-01 (×2): 40 mg via SUBCUTANEOUS
  Filled 2015-06-30 (×2): qty 0.4

## 2015-06-30 MED ORDER — ROSUVASTATIN CALCIUM 5 MG PO TABS
5.0000 mg | ORAL_TABLET | Freq: Every day | ORAL | Status: DC
  Administered 2015-06-30 – 2015-07-01 (×2): 5 mg via ORAL
  Filled 2015-06-30 (×2): qty 1

## 2015-06-30 NOTE — H&P (Signed)
Date: 06/30/2015               Patient Name:  Kathleen Jarvis MRN: [id number]  DOB: 1950/10/31 Age / Sex: 65 y.o., female   PCP: Thana Farr         Medical Service: Internal Medicine Teaching Service         Attending Physician: Dr. Aldine Contes, MD    First Contact: Lavell Islam, MS4 Pager: [phone]  Second Contact: Dr. Jacques Earthly Pager: [phone]       After Hours (After 5p/  First Contact Pager: [phone]  weekends / holidays): Second Contact Pager: [phone]   Chief Complaint: Shortness of breath  History of Present Illness: 65 year old woman with history of cardiomyopathy, chronic CHF with reduced EF (echo 09/12/2014 with EF 123456, grade 1 diastolic dysfunction) s/p AICD in 2012, mod to severe mitral regurgitation, meningioma s/p craniotomy in 2014, HLD, allergic rhinitis, OSA, hypothyroidism, HTN presenting with dyspnea and chest pressure x 2 days.  Developed a cough and sneezing on Tuesday. The following day she developed chest discomfort with sneezing and dyspnea. By Thursday, her dyspnea had worsened to being unbearable on Friday. +orthopnea. Presently can only walk a few feet before getting short of breath.   Chest pain on left side around area of ICD. Felt like weight on chest, pressure. No radiation. Worse with moving around. Resting makes it better. Intermittent at first but then became more constant last night. Pressure improving today.  Took Lasix this morning.   No lightheadedness or dizziness. +fatigue. No leg edema. Urinating without difficulty.  Bronchitis in March - took antibiotics for this. Resolved after this.  Meds: fluticasone (FLONASE) 50 mcg/actuation nasal spray  2 Sprays by Both Nostrils route daily.  albuterol (PROVENTIL HFA, VENTOLIN HFA, PROAIR HFA) 90 mcg/actuation inhaler  Indications: BRONCHOSPASM PREVENTION Take 2 Puffs by inhalation every four (4) hours as needed for Wheezing or Shortness of Breath. Indications: BRONCHOSPASM PREVENTION    lisinopril (PRINIVIL, ZESTRIL) 5 mg tablet  TAKE ONE TABLET BY MOUTH DAILY  carvedilol (COREG) 3.125 mg tablet  TAKE ONE TABLET BY MOUTH TWICE A DAY  furosemide (LASIX) 20 mg tablet  TAKE ONE TABLET BY MOUTH DAILY  guaiFENesin-codeine (CHERATUSSIN AC) 100-10 mg/5 mL solution  Take 10 mL by mouth four (4) times daily as needed for Cough. Max Daily Amount: 40 mL.  calcium-vitamin D (CALCIUM 600 + D,3,) 600 mg(1,500mg ) -200 unit tab  Indications: Osteoporosis Take 1 Tab by mouth two (2) times daily (with meals).  naltrexone-buPROPion (CONTRAVE) 8-90 mg TbER ER tablet  Indications: WEIGHT LOSS MANAGEMENT FOR OBESE PATIENT (BMI >= 30) Week 5 and Beyond: Contrave 8mg /90mg  #120 2 Tab AM, 2 Tab PM Indications: WEIGHT LOSS MANAGEMENT FOR OBESE PATIENT (BMI >= 30)  CRESTOR 5 mg tablet  TAKE ONE TABLET BY MOUTH EVERY EVENING     Allergies: Allergies as of 06/30/2015 - Review Complete 06/30/2015  Allergen Reaction Noted  . Tramadol Other (See Comments) 06/30/2015  . Penicillins Itching and Rash 06/30/2015   Past Medical History  Diagnosis Date  . CHF (congestive heart failure) ([place])   . Cardiac LV ejection fraction 10-20%   . Bronchitis   . High cholesterol    Past Surgical History  Procedure Laterality Date  . Cholecystectomy     Family History Mother (deceased) CAD, DM Father (deceased) CAD, DM Maternal grandmother (deceased) CAD, DM Paternal grandmater (deceased) CAD, DM  Social History   Social History  . Marital Status: Married  Spouse Name: N/A  . Number of Children: N/A  . Years of Education: N/A   Occupational History  . Not on file.   Social History Main Topics  . Smoking status: Never Smoker   . Smokeless tobacco: Not on file  . Alcohol Use: No  . Drug Use: No  . Sexual Activity: Not on file   Other Topics Concern  . Not on file   Social History Narrative  . No narrative on file    Review of Systems: Constitutional: no fevers Eyes: no vision  changes Ears, nose, mouth, throat, and face: +cough, dry, +rhinorrhea Respiratory: +shortness of breath Cardiovascular: +chest pain, no palpitations Gastrointestinal: no nausea/vomiting, no abdominal pain, no constipation, no diarrhea Genitourinary: no dysuria, no hematuria Integument: no rash Hematologic/lymphatic: no edema Musculoskeletal: no arthralgias, no myalgias Neurological: no paresthesias, no weakness  Physical Exam: Blood pressure 121/79, pulse 93, temperature 97.9 F (36.6 C), temperature source Oral, resp. rate 20, height 5\' 9"  (1.753 m), weight 200 lb (90.719 kg), SpO2 98 %. General Apperance: NAD Head: Normocephalic, atraumatic Eyes: PERRL, EOMI, anicteric sclera Ears: Normal external ear canal Nose: Nares normal, septum midline, mucosa normal Throat: Lips, mucosa and tongue normal  Neck: Supple, trachea midline, +mild JVD Back: No tenderness or bony abnormality  Lungs: +Rales bilateral bases. Breathing comfortably on [place] Chest Wall: Nontender, no deformity Heart: Regular rate and rhythm, +S3 Abdomen: Soft, nontender, nondistended, no rebound/guarding Extremities: Normal, atraumatic, warm and well perfused, no edema Pulses: 2+ throughout Skin: No rashes or lesions Neurologic: Alert and oriented x 3. CNII-XII intact. Normal strength and sensation  Lab results: Basic Metabolic Panel:  Recent Labs  06/30/15 1021 06/30/15 1230  NA 143 142  K 4.3 4.7  CL 105 105  CO2 30 26  GLUCOSE 92 88  BUN 14 11  CREATININE 0.73 0.73  CALCIUM 9.2 9.3   Liver Function Tests:  Recent Labs  06/30/15 1021  AST 14  ALT 16  ALKPHOS 88  BILITOT 0.8  PROT 6.7  ALBUMIN 4.2   CBC:  Recent Labs  06/30/15 1026 06/30/15 1230  WBC 5.9 5.0  NEUTROABS  --  3.4  HGB 11.8* 11.4*  HCT 32.9* 35.6*  MCV 88.9 94.4  PLT  --  212   Urinalysis:  Recent Labs  06/30/15 1026  BILIRUBINUR negative  KETONESUR negative  PROTEINUR negative  UROBILINOGEN 0.2  NITRITE  Negative  LEUKOCYTESUR large (3+)*   Imaging results:  Dg Chest 2 View  06/30/2015  CLINICAL DATA:  Patient with shortness of breath and chest tightness. EXAM: CHEST  2 VIEW COMPARISON:  None. FINDINGS: Multi lead AICD device overlies the left hemi thorax. Cardiomegaly. There is a rounded nodular opacity within the right mid lower lung. Additionally heterogeneous opacity demonstrated within the right hilar location. No pleural effusion or pneumothorax. Thoracic spine degenerative changes. IMPRESSION: There is a rounded nodular density within the medial right lower lung, potentially calcified. Additionally there is nonspecific heterogeneous opacification within the right perihilar location which may represent focal pulmonary consolidation, dilated hilar vessels or hilar mass/ adenopathy. Given the multitude of findings, and no priors for comparison, this needs correlation with contrast-enhanced chest CT. These results will be called to the ordering clinician or representative by the Radiologist Assistant, and communication documented in the PACS or zVision Dashboard. Electronically Signed   By: Lovey Newcomer M.D.   On: 06/30/2015 09:21   Ct Angio Chest Pe W/cm &/or Wo Cm  06/30/2015  CLINICAL DATA:  Shortness  of breath with chest pain EXAM: CT ANGIOGRAPHY CHEST WITH CONTRAST TECHNIQUE: Multidetector CT imaging of the chest was performed using the standard protocol during bolus administration of intravenous contrast. Multiplanar CT image reconstructions and MIPs were obtained to evaluate the vascular anatomy. CONTRAST:  100 mL Isovue 370 nonionic COMPARISON:  Chest radiograph Jun 30, 2015 FINDINGS: Mediastinum/Lymph Nodes: There is no demonstrable pulmonary embolus. There is no thoracic aortic aneurysm or dissection. The visualized great vessels appear normal. There are scattered foci of atherosclerotic calcification in the aorta. The heart is mildly enlarged with mild left ventricular hypertrophy. The pericardium  is not thickened. Pacemaker leads are attached to the right atrium and right ventricle. There is enlargement of the left lobe of the thyroid with multiple coarse calcifications in the left lobe of the thyroid. There are small mediastinal lymph nodes but no adenopathy by size criteria. Lungs/Pleura: There is a fairly small right pleural effusion with right base airspace consolidation. There is patchy atelectasis with minimal effusion on the left. There is no parenchymal lung mass appreciable; there is no pulmonary nodular lesion to account for the nodular areas seen in the inferior right hilar region on the chest radiograph. Upper abdomen: Visualized upper abdominal structures are unremarkable. Note that there is reflux of contrast into the inferior vena cava. Musculoskeletal: There is degenerative change in the thoracic spine. There are no blastic or lytic bone lesions. Review of the MIP images confirms the above findings. IMPRESSION: No demonstrable pulmonary embolus. Right pleural effusion with right base airspace consolidation. Small left effusion with left base atelectatic change. No parenchymal lung nodular lesion seen by CT.  No adenopathy. Enlargement of the left lobe of the thyroid with coarse calcifications in the left lobe of the thyroid. Reflux of contrast into the inferior vena cava may be indicative of increase in right heart pressure. Note that there is mild cardiomegaly with mild left ventricular hypertrophy. Pacemaker leads are attached to the right atrium and right ventricle. Electronically Signed   By: Lowella Grip III M.D.   On: 06/30/2015 14:34    Other results: EKG: T wave inversions in anteriolateral leads, some PVCs, no previous EKG for comparison  Assessment & Plan by Problem: 65 year old woman with history of cardiomyopathy, chronic CHF with reduced EF (echo 09/12/2014 with EF 123456, grade 1 diastolic dysfunction) s/p AICD in 2012, mod to severe mitral regurgitation, meningioma s/p  craniotomy, HLD, allergic rhinitis, OSA, hypothyroidism, HTN presenting with dyspnea and chest pressure x 2 days.  Acute on chronic CHF with reduced EF: Echo 09/12/2014 with EF 123456, grade 1 diastolic dysfunction) s/p AICD in 2012. BNP 957.9. No previous measurements. She has chest pressure that seems to be improving with diuresis. EKG with no acute ischemic changes and initial trop POC 0. CXR with rounded nodular density within the medial right lower, nonspecific heterogeneous opacification within the right perihilar location. CTA chest with no PE, right pleural effusion with R base consolidation, small left effusion. -Trend troponin and repeat EKG in AM. -Repeat echo -Strict in/out, daily weights -Consider consulting cardiology tomorrow given advanced HF -Continue home carvedilol 3.125mg  BID and lisinopril 5mg  daily -Lasix 40mg  in ED. Will give 20 IV BID tomorrow -Continue home Crestor 5mg  daily  Allergic rhinitis: Continue home Flonase.   OSA: not on CPAP at home.  Hypothyroidism: Recheck TSH  HTN: Normotensive in ED  VTE ppx: Subq Lovenox  Code: FULL  Dispo: Disposition is deferred at this time, awaiting improvement of current medical problems. Anticipated discharge in  approximately 1-2 day(s).   The patient does have a current PCP (Noel L Hunte) and does not need an [facility] hospital follow-up appointment after discharge.  The patient does not have transportation limitations that hinder transportation to clinic appointments.  Signed: Milagros Loll, MD PGY-2 06/30/2015, 3:37 PM

## 2015-06-30 NOTE — ED Provider Notes (Signed)
CSN: [id number]     Arrival date & time 06/30/15  1155 History   First MD Initiated Contact with Patient 06/30/15 1205     Chief Complaint  Patient presents with  . Chest Pain  . Shortness of Breath     (Consider location/radiation/quality/duration/timing/severity/associated sxs/prior Treatment) HPI Comments: 65 year old female with history of CHF, EF of 10% who is visiting from [place] presents for shortness of breath. The patient reports that over the last few days she has had increasing shortness of breath. This started initially with a cough. The cough is nonproductive. She denies fevers. She states that she is then been progressively short of breath. Shortness of breath is made worse with exertion. She does say it feels similar to when she's had issues with her heart in the past. She reports she has been taking her Lasix as prescribed. She has had some intermittent chest pain but no active chest pain at this moment. Patient does not require supplemental oxygen at baseline. She is visiting from [place] and and has all of her care out of [facility].   Past Medical History  Diagnosis Date  . CHF (congestive heart failure) ([place])   . Cardiac LV ejection fraction 10-20%   . Bronchitis   . High cholesterol    Past Surgical History  Procedure Laterality Date  . Cholecystectomy     No family history on file. Social History  Substance Use Topics  . Smoking status: Never Smoker   . Smokeless tobacco: None  . Alcohol Use: No   OB History    No data available     Review of Systems  Constitutional: Negative for fever, chills and unexpected weight change.  HENT: Negative for congestion, postnasal drip and rhinorrhea.   Respiratory: Positive for cough and shortness of breath. Negative for chest tightness.   Cardiovascular: Positive for chest pain. Negative for palpitations.  Gastrointestinal: Negative for nausea, vomiting, abdominal pain and diarrhea.  Genitourinary:  Negative for dysuria, urgency and hematuria.  Musculoskeletal: Negative for myalgias and back pain.  Skin: Negative for rash.  Neurological: Negative for dizziness, weakness and headaches.  Hematological: Does not bruise/bleed easily.      Allergies  Tramadol and Penicillins  Home Medications   Prior to Admission medications   Medication Sig Start Date End Date Taking? Authorizing Provider  albuterol (PROVENTIL HFA;VENTOLIN HFA) 108 (90 Base) MCG/ACT inhaler Inhale 2 puffs into the lungs every 4 (four) hours as needed for wheezing or shortness of breath.   Yes Historical Provider, MD  carvedilol (COREG) 6.25 MG tablet Take 6.25 mg by mouth 2 (two) times daily with a meal.   Yes Historical Provider, MD  Cholecalciferol-Vitamin C (VITAMIN D3-VITAMIN C) 1000-500 UNIT-MG CAPS Take 1 capsule by mouth 2 (two) times daily.   Yes Historical Provider, MD  furosemide (LASIX) 20 MG tablet Take 20 mg by mouth daily.    Yes Historical Provider, MD  lisinopril (PRINIVIL,ZESTRIL) 5 MG tablet Take 5 mg by mouth at bedtime.    Yes Historical Provider, MD  naproxen sodium (ALEVE) 220 MG tablet Take 220 mg by mouth daily as needed (pain).   Yes Historical Provider, MD  rosuvastatin (CRESTOR) 5 MG tablet Take 5 mg by mouth at bedtime.  06/14/15  Yes Historical Provider, MD   BP 121/79 mmHg  Pulse 93  Temp(Src) 97.9 F (36.6 C) (Oral)  Resp 20  Ht 5\' 9"  (1.753 m)  Wt 200 lb (90.719 kg)  BMI 29.52 kg/m2  SpO2  98% Physical Exam  Constitutional: She is oriented to person, place, and time. She appears well-developed and well-nourished. No distress.  HENT:  Head: Normocephalic and atraumatic.  Right Ear: External ear normal.  Left Ear: External ear normal.  Nose: Nose normal.  Mouth/Throat: Oropharynx is clear and moist. No oropharyngeal exudate.  Eyes: EOM are normal. Pupils are equal, round, and reactive to light.  Neck: Normal range of motion. Neck supple.  Cardiovascular: Normal rate, regular  rhythm and intact distal pulses.   Murmur heard. Pulmonary/Chest: Effort normal. No respiratory distress. She has no wheezes. She has rales (few, scattered, bilateral).  Abdominal: Soft. She exhibits no distension. There is no tenderness.  Musculoskeletal: Normal range of motion. She exhibits edema (trace, bilateral). She exhibits no tenderness.  Neurological: She is alert and oriented to person, place, and time.  Skin: Skin is warm and dry. No rash noted. She is not diaphoretic.  Vitals reviewed.   ED Course  Procedures (including critical care time) Labs Review Labs Reviewed  CBC WITH DIFFERENTIAL/PLATELET - Abnormal; Notable for the following:    RBC 3.77 (*)    Hemoglobin 11.4 (*)    HCT 35.6 (*)    All other components within normal limits  BRAIN NATRIURETIC PEPTIDE - Abnormal; Notable for the following:    B Natriuretic Peptide 957.9 (*)    All other components within normal limits  BASIC METABOLIC PANEL  I-STAT TROPOININ, ED    Imaging Review Dg Chest 2 View  06/30/2015  CLINICAL DATA:  Patient with shortness of breath and chest tightness. EXAM: CHEST  2 VIEW COMPARISON:  None. FINDINGS: Multi lead AICD device overlies the left hemi thorax. Cardiomegaly. There is a rounded nodular opacity within the right mid lower lung. Additionally heterogeneous opacity demonstrated within the right hilar location. No pleural effusion or pneumothorax. Thoracic spine degenerative changes. IMPRESSION: There is a rounded nodular density within the medial right lower lung, potentially calcified. Additionally there is nonspecific heterogeneous opacification within the right perihilar location which may represent focal pulmonary consolidation, dilated hilar vessels or hilar mass/ adenopathy. Given the multitude of findings, and no priors for comparison, this needs correlation with contrast-enhanced chest CT. These results will be called to the ordering clinician or representative by the Radiologist  Assistant, and communication documented in the PACS or zVision Dashboard. Electronically Signed   By: Lovey Newcomer M.D.   On: 06/30/2015 09:21   Ct Angio Chest Pe W/cm &/or Wo Cm  06/30/2015  CLINICAL DATA:  Shortness of breath with chest pain EXAM: CT ANGIOGRAPHY CHEST WITH CONTRAST TECHNIQUE: Multidetector CT imaging of the chest was performed using the standard protocol during bolus administration of intravenous contrast. Multiplanar CT image reconstructions and MIPs were obtained to evaluate the vascular anatomy. CONTRAST:  100 mL Isovue 370 nonionic COMPARISON:  Chest radiograph Jun 30, 2015 FINDINGS: Mediastinum/Lymph Nodes: There is no demonstrable pulmonary embolus. There is no thoracic aortic aneurysm or dissection. The visualized great vessels appear normal. There are scattered foci of atherosclerotic calcification in the aorta. The heart is mildly enlarged with mild left ventricular hypertrophy. The pericardium is not thickened. Pacemaker leads are attached to the right atrium and right ventricle. There is enlargement of the left lobe of the thyroid with multiple coarse calcifications in the left lobe of the thyroid. There are small mediastinal lymph nodes but no adenopathy by size criteria. Lungs/Pleura: There is a fairly small right pleural effusion with right base airspace consolidation. There is patchy atelectasis with minimal effusion  on the left. There is no parenchymal lung mass appreciable; there is no pulmonary nodular lesion to account for the nodular areas seen in the inferior right hilar region on the chest radiograph. Upper abdomen: Visualized upper abdominal structures are unremarkable. Note that there is reflux of contrast into the inferior vena cava. Musculoskeletal: There is degenerative change in the thoracic spine. There are no blastic or lytic bone lesions. Review of the MIP images confirms the above findings. IMPRESSION: No demonstrable pulmonary embolus. Right pleural effusion with  right base airspace consolidation. Small left effusion with left base atelectatic change. No parenchymal lung nodular lesion seen by CT.  No adenopathy. Enlargement of the left lobe of the thyroid with coarse calcifications in the left lobe of the thyroid. Reflux of contrast into the inferior vena cava may be indicative of increase in right heart pressure. Note that there is mild cardiomegaly with mild left ventricular hypertrophy. Pacemaker leads are attached to the right atrium and right ventricle. Electronically Signed   By: Lowella Grip III M.D.   On: 06/30/2015 14:34   I have personally reviewed and evaluated these images and lab results as part of my medical decision-making.   EKG Interpretation   Date/Time:  Saturday Jun 30 2015 11:56:40 EDT Ventricular Rate:  100 PR Interval:  164 QRS Duration: 104 QT Interval:  396 QTC Calculation: 510 R Axis:   19 Text Interpretation:  Sinus rhythm with frequent Premature ventricular  complexes Minimal voltage criteria for LVH, may be normal variant ST \\T \ T  wave abnormality, consider lateral ischemia Prolonged QT Abnormal ECG No  ekgs available from before today for comparison Confirmed by NGUYEN, EMILY  [phone]) on 06/30/2015 12:02:39 PM      MDM  Patient was seen and evaluated in stable condition.  Patient required supplemental oxygen via nasal cannula due to a saturation of 88%. Results with elevated BNP and signs of fluid overload on her imaging. EKG abnormal but no previous before today to compare to. Troponin unremarkable.  IV Lasix ordered. Attempted to get patient records from [facility] in [place] where she receives her care. They were closed in the records office.  Case was discussed with the internal medicine team who agreed with admission. Patient was admitted under their care to the telemetry floor. Final diagnoses:  Acute congestive heart failure, unspecified congestive heart failure type ([place])    1.  Acute CHF    Harvel Quale, MD 06/30/15 [phone]

## 2015-06-30 NOTE — ED Notes (Signed)
Received pt from urgent care with c/o left sided chest pain and shortness of breath x 2-3 days. Pt recently diagnosed with bronchitis in March. Pt initial pulse ox 88% on room air. Pt given 2 L O2 via [place] which brought pulse ox to 99%. Chest pain decreases with oxygen use.

## 2015-06-30 NOTE — H&P (Signed)
Date: 06/30/2015               Patient Name:  Kathleen Jarvis MRN: [id number]  DOB: October 25, 1950 Age / Sex: 65 y.o., female   PCP: Thana Farr              Medical Service: Internal Medicine Teaching Service              Attending Physician: Dr. Aldine Contes, MD    First Contact: Lavell Islam, MS4 Pager: [phone]  Second Contact: Dr. Randell Patient Pager: [phone]            After Hours (After 5p/  First Contact Pager: [phone]  weekends / holidays): Second Contact Pager: [phone]   Chief Complaint: Shortness of breath, fatigue  History of Present Illness: Kathleen Jarvis is a 65 yo woman with PMH significant for CHF, non-ischemic cardiomyopathy, non RF mitral regurgitation, and HLD who presents with 5 days of SOB, DOE, cough, and chest pain. She is visiting from [place], and began to notice a cough and rinnorhea earlier this week, which she initially attributed to bronchitis. She subsequently developed increased fatigue, SOB, and DOE significant enough to require rest after only a few steps. She notes increased orthopnea from baseline. There is also a dull, squeezing pain in her L chest, initially intermittent, that has become constant in the last two days. It is fairly constant, does not radiate, and is relieved by sitting down. She has been taking her home medications as prescribed, and notes good UOP with the lasix. She has not observed any peripheral edema.   She denies recent fever or chills, though does note being very cold one day this week. There is no sputum production and the rinnorrhea has resolved. She has experienced symptoms like this once before, when she was diagnosed with bronchitis earlier this year.    Meds: Current Facility-Administered Medications  Medication Dose Route Frequency Provider Last Rate Last Dose  . 0.9 %  sodium chloride infusion  250 mL Intravenous PRN Milagros Loll, MD      . acetaminophen (TYLENOL) tablet 650 mg  650 mg Oral Q4H PRN Milagros Loll, MD      . albuterol (PROVENTIL HFA;VENTOLIN HFA) 108 (90 Base) MCG/ACT inhaler 2 puff  2 puff Inhalation Q4H PRN Milagros Loll, MD      . carvedilol (COREG) tablet 3.125 mg  3.125 mg Oral BID WC Milagros Loll, MD      . enoxaparin (LOVENOX) injection 40 mg  40 mg Subcutaneous Q24H Milagros Loll, MD      . fluticasone [facility]) 50 MCG/ACT nasal spray 2 spray  2 spray Each Nare Daily Milagros Loll, MD      . Derrill Memo ON 07/01/2015] furosemide (LASIX) injection 20 mg  20 mg Intravenous Q12H Milagros Loll, MD      . lisinopril (PRINIVIL,ZESTRIL) tablet 5 mg  5 mg Oral QHS Milagros Loll, MD      . ondansetron [facility]) injection 4 mg  4 mg Intravenous Q6H PRN Milagros Loll, MD      . rosuvastatin (CRESTOR) tablet 5 mg  5 mg Oral QHS Milagros Loll, MD      . sodium chloride flush (NS) 0.9 % injection 3 mL  3 mL Intravenous Q12H Milagros Loll, MD      . sodium chloride flush (NS) 0.9 % injection 3 mL  3 mL Intravenous PRN Milagros Loll, MD  Current Outpatient Prescriptions  Medication Sig Dispense Refill  . albuterol (PROVENTIL HFA;VENTOLIN HFA) 108 (90 Base) MCG/ACT inhaler Inhale 2 puffs into the lungs every 4 (four) hours as needed for wheezing or shortness of breath.    . carvedilol (COREG) 6.25 MG tablet Take 6.25 mg by mouth 2 (two) times daily with a meal.    . Cholecalciferol-Vitamin C (VITAMIN D3-VITAMIN C) 1000-500 UNIT-MG CAPS Take 1 capsule by mouth 2 (two) times daily.    . furosemide (LASIX) 20 MG tablet Take 20 mg by mouth daily.     Marland Kitchen lisinopril (PRINIVIL,ZESTRIL) 5 MG tablet Take 5 mg by mouth at bedtime.     . naproxen sodium (ALEVE) 220 MG tablet Take 220 mg by mouth daily as needed (pain).    . rosuvastatin (CRESTOR) 5 MG tablet Take 5 mg by mouth at bedtime.       Allergies: Allergies as of 06/30/2015 - Review Complete 06/30/2015  Allergen Reaction Noted  . Tramadol Other (See Comments) 06/30/2015  . Penicillins Itching and Rash  06/30/2015   Past Medical History  Diagnosis Date  . CHF (congestive heart failure) ([place])   . Cardiac LV ejection fraction 10-20%   . Bronchitis   . High cholesterol   . Brain tumor [facility])    Past Surgical History  Procedure Laterality Date  . Cholecystectomy     No family history on file. Social History   Social History  . Marital Status: Married    Spouse Name: N/A  . Number of Children: N/A  . Years of Education: N/A   Occupational History  . Not on file.   Social History Main Topics  . Smoking status: Never Smoker   . Smokeless tobacco: Not on file  . Alcohol Use: No  . Drug Use: No  . Sexual Activity: Not on file   Other Topics Concern  . Not on file   Social History Narrative  . No narrative on file    Review of Systems: General: No fevers, chills, weight changes Neuro: No headaches, vision changes, AMS CV: CP as described in HPI. No palpitations Pulm: Per HPI GI: No abd pain, no changes in bowel habits Renal: No dysuria, increased or decreased frequency, no hematuria Ext: No peripheral edema   Physical Exam: Blood pressure 106/82, pulse 99, temperature 97.9 F (36.6 C), temperature source Oral, resp. rate 17, height 5\' 9"  (1.753 m), weight 90.719 kg (200 lb), SpO2 100 %.  General: Well appearing, propped-up in in bed with nasal cannula in NAD HEENT: NCAT. PERRL, sclera anicteric. Oropharynx clear, moist mucous membranes.  CV: normal sinus rhythm with occasional PVCs. II/VI systolic murmur auscultated best over apex. Distal pulses 2+ and symmetric. Mild JVD appreciable 1-2 cm above sternum in semirecumbent position.  Pulm: Crackes in bilateral lung bases, R>L. Remainder CTAB. On  Abd: inconsistently TTP in R side, some intentional guarding. Soft with distraction. No rebound tenderness. Mildly-distended. Neuro: Limited by AMS, but when awake alert and oriented. Conversive but irritable.  Extremities: Warm, well-perfused, pulses 2+, symmetric distally.  No peripheral edema  Lab results: Results for orders placed or performed during the hospital encounter of 06/30/15 (from the past 24 hour(s))  CBC with Differential     Status: Abnormal   Collection Time: 06/30/15 12:30 PM  Result Value Ref Range   WBC 5.0 4.0 - 10.5 K/uL   RBC 3.77 (L) 3.87 - 5.11 MIL/uL   Hemoglobin 11.4 (L) 12.0 - 15.0 g/dL   HCT 35.6 (L)  36.0 - 46.0 %   MCV 94.4 78.0 - 100.0 fL   MCH 30.2 26.0 - 34.0 pg   MCHC 32.0 30.0 - 36.0 g/dL   RDW 13.6 11.5 - 15.5 %   Platelets 212 150 - 400 K/uL   Neutrophils Relative % 68 %   Neutro Abs 3.4 1.7 - 7.7 K/uL   Lymphocytes Relative 25 %   Lymphs Abs 1.3 0.7 - 4.0 K/uL   Monocytes Relative 6 %   Monocytes Absolute 0.3 0.1 - 1.0 K/uL   Eosinophils Relative 1 %   Eosinophils Absolute 0.0 0.0 - 0.7 K/uL   Basophils Relative 0 %   Basophils Absolute 0.0 0.0 - 0.1 K/uL  Basic metabolic panel     Status: None   Collection Time: 06/30/15 12:30 PM  Result Value Ref Range   Sodium 142 135 - 145 mmol/L   Potassium 4.7 3.5 - 5.1 mmol/L   Chloride 105 101 - 111 mmol/L   CO2 26 22 - 32 mmol/L   Glucose, Bld 88 65 - 99 mg/dL   BUN 11 6 - 20 mg/dL   Creatinine, Ser 0.73 0.44 - 1.00 mg/dL   Calcium 9.3 8.9 - 10.3 mg/dL   GFR calc non Af Amer >60 >60 mL/min   GFR calc Af Amer >60 >60 mL/min   Anion gap 11 5 - 15  Brain natriuretic peptide     Status: Abnormal   Collection Time: 06/30/15 12:30 PM  Result Value Ref Range   B Natriuretic Peptide 957.9 (H) 0.0 - 100.0 pg/mL  I-Stat Troponin, ED (not at [facility])     Status: None   Collection Time: 06/30/15 12:47 PM  Result Value Ref Range   Troponin i, poc 0.00 0.00 - 0.08 ng/mL   Comment 3            Imaging results:  Dg Chest 2 View  06/30/2015  CLINICAL DATA:  Patient with shortness of breath and chest tightness. EXAM: CHEST  2 VIEW COMPARISON:  None. FINDINGS: Multi lead AICD device overlies the left hemi thorax. Cardiomegaly. There is a rounded nodular opacity within the right  mid lower lung. Additionally heterogeneous opacity demonstrated within the right hilar location. No pleural effusion or pneumothorax. Thoracic spine degenerative changes. IMPRESSION: There is a rounded nodular density within the medial right lower lung, potentially calcified. Additionally there is nonspecific heterogeneous opacification within the right perihilar location which may represent focal pulmonary consolidation, dilated hilar vessels or hilar mass/ adenopathy. Given the multitude of findings, and no priors for comparison, this needs correlation with contrast-enhanced chest CT. These results will be called to the ordering clinician or representative by the Radiologist Assistant, and communication documented in the PACS or zVision Dashboard. Electronically Signed   By: Lovey Newcomer M.D.   On: 06/30/2015 09:21   Ct Angio Chest Pe W/cm &/or Wo Cm  06/30/2015  CLINICAL DATA:  Shortness of breath with chest pain EXAM: CT ANGIOGRAPHY CHEST WITH CONTRAST TECHNIQUE: Multidetector CT imaging of the chest was performed using the standard protocol during bolus administration of intravenous contrast. Multiplanar CT image reconstructions and MIPs were obtained to evaluate the vascular anatomy. CONTRAST:  100 mL Isovue 370 nonionic COMPARISON:  Chest radiograph Jun 30, 2015 FINDINGS: Mediastinum/Lymph Nodes: There is no demonstrable pulmonary embolus. There is no thoracic aortic aneurysm or dissection. The visualized great vessels appear normal. There are scattered foci of atherosclerotic calcification in the aorta. The heart is mildly enlarged with mild left ventricular hypertrophy.  The pericardium is not thickened. Pacemaker leads are attached to the right atrium and right ventricle. There is enlargement of the left lobe of the thyroid with multiple coarse calcifications in the left lobe of the thyroid. There are small mediastinal lymph nodes but no adenopathy by size criteria. Lungs/Pleura: There is a fairly small  right pleural effusion with right base airspace consolidation. There is patchy atelectasis with minimal effusion on the left. There is no parenchymal lung mass appreciable; there is no pulmonary nodular lesion to account for the nodular areas seen in the inferior right hilar region on the chest radiograph. Upper abdomen: Visualized upper abdominal structures are unremarkable. Note that there is reflux of contrast into the inferior vena cava. Musculoskeletal: There is degenerative change in the thoracic spine. There are no blastic or lytic bone lesions. Review of the MIP images confirms the above findings. IMPRESSION: No demonstrable pulmonary embolus. Right pleural effusion with right base airspace consolidation. Small left effusion with left base atelectatic change. No parenchymal lung nodular lesion seen by CT.  No adenopathy. Enlargement of the left lobe of the thyroid with coarse calcifications in the left lobe of the thyroid. Reflux of contrast into the inferior vena cava may be indicative of increase in right heart pressure. Note that there is mild cardiomegaly with mild left ventricular hypertrophy. Pacemaker leads are attached to the right atrium and right ventricle. Electronically Signed   By: Lowella Grip III M.D.   On: 06/30/2015 14:34    Other results: EKG: Sinus rhythm with frequent Premature ventricular complexes Minimal voltage criteria for LVH, may be normal variant ST & T wave abnormality, consider lateral ischemia Prolonged QT  Assessment & Plan by Problem: Active Problems:   CHF (congestive heart failure) (HCC)   CHF exacerbation ([place])  Ms. Zeoli is a 65 yo F who with known CHF, non-ischemic cardiomyopathy, and mitral regurgitation, who presents with s/s of a CHF exacerbation.   CHF Exacerbation: Known CHF, followed by cardiologist in [place]. Most recent echo in Fall 2016 showed EF 20-25%, improved from ~10% at diagnosis in 2001. Pt p/w SOB, DOE, and increased orthopnea.  Vitals tachypneic, mildly tachycardic, and occasionally desatting to low 90s. Admission labs show BNP of 957 without known baseline value, normal CMP and CBC, and negative POC troponin. Chest CT showed mild R pleural effusion and L atelectasis without focal consolidations. Exact etiology leading to exacerbation unclear, though possibly related to mild URI symptoms experienced at the beginning of the week. EKG not suggestive of acute ischemic changes, and initial POC troponin negative, but given her concurrent CP, will repeat troponins and CTM on tele. She seems reliably compliant with her home medication regimen, and very responsive to antihypertensives/diuretics, being well-controlled on 20 Lasix OD and having previously experienced orthostatic sx on 5mg  lisinopril. Current infection seems unlikely given that she is afebrile and has nl WBC. She does have a hx of hypothyroidism, and we will check TSH.  -Admit to floor for monitoring and stabilization -Tele, Pulse ox -Increase home lasix to 20 mg BID -Continue home lisinopril, coreg, statin -Repeat echocardiogram  -Low threshold to consult heart failure team PRN given tenuous cardiac history -AM BMP, TSH -Trop x2 -AM EKG  FEN/GI:  -Heart healthy diet -Zofran PRN  Asthma: Unclear history. Began using inhaler after recent bronchitis. -Continue home Albuterol inhaler, fluticasone PRN   Ppx:  -[place] lovenox   Dispo: Disposition is deferred at this time, awaiting improvement of current medical problems. Anticipated discharge in approximately 1-2  day(s).   The patient does have a current PCP in [place] and does need an [facility] hospital follow-up appointment after discharge.  The patient does not have transportation limitations that hinder transportation to clinic appointments.   This is a Careers information officer Note.  The care of the patient was discussed with Dr. Randell Patient and the assessment and plan was formulated with their assistance.  Please see their  note for official documentation of the patient encounter.   Signed: Konrad Penta, Med Student 06/30/2015, 4:57 PM

## 2015-06-30 NOTE — Patient Instructions (Addendum)
     IF you received an x-ray today, you will receive an invoice from [facility]. Please contact [facility] at [phone] with questions or concerns regarding your invoice.   IF you received labwork today, you will receive an invoice from [facility]. Please contact [facility] at [phone] with questions or concerns regarding your invoice.   Our billing staff will not be able to assist you with questions regarding bills from these companies.  You will be contacted with the lab results as soon as they are available. The fastest way to get your results is to activate your My Chart account. Instructions are located on the last page of this paperwork. If you have not heard from us regarding the results in 2 weeks, please contact this office.         IF you received an x-ray today, you will receive an invoice from [facility]. Please contact [facility] at [phone] with questions or concerns regarding your invoice.   IF you received labwork today, you will receive an invoice from [facility]. Please contact [facility] at [phone] with questions or concerns regarding your invoice.   Our billing staff will not be able to assist you with questions regarding bills from these companies.  You will be contacted with the lab results as soon as they are available. The fastest way to get your results is to activate your My Chart account. Instructions are located on the last page of this paperwork. If you have not heard from us regarding the results in 2 weeks, please contact this office.     

## 2015-06-30 NOTE — ED Notes (Signed)
Admitting MD at bedside.

## 2015-06-30 NOTE — Progress Notes (Addendum)
Subjective:  By signing my name below, I, Kathleen Jarvis, attest that this documentation has been prepared under the direction and in the presence of Kathleen Lin, MD.  Electronically Signed: Thea Jarvis, ED Scribe. 06/30/2015. 8:56 AM.   Patient ID: Kathleen Jarvis, female    DOB: Oct 10, 1950, 65 y.o.   MRN: [id number]  HPI Chief Complaint  Patient presents with  . Shortness of Breath  . Chest Pain    HPI Comments: Kathleen Jarvis is a 65 y.o. female who presents to the [facility] complaining of gradually worsening CP, described as pressure and SOB that began 1-2 days ago with associated cough. Pt is from out of town, [place] and thought symptoms would subsided but instead have worsened since onset. She reports SOB with activity, such as walking, and has difficulty "getting air out". Pt has a Psychologist, forensic and defibrillator implant and denies feeling her defibrillator go off. She denies fever, chills, abdominal pain, nausea and emesis.    Patient Active Problem List from careeverywhere: Diagnosis Code  . CHF (congestive heart failure) (HCC) I50.9  . Hypothyroid hx goiter E03.9  . Fibroid D25.9  . HTN (hypertension) I10  . History of elevated lipids Z86.39  . Gallstones K80.20  . Insomnia G47.00  . OSA (obstructive sleep apnea) G47.33  . Cataract H26.9  . Cardiomyopathy ([place]) I42.9  . AICD (automatic cardioverter/defibrillator) present Z95.810  . Chronic systolic heart failure (HCC) I50.22  . Other primary cardiomyopathies ([place]) I42.8  . Automatic implantable cardioverter-defibrillator in situ Z95.810  . Hypotension, unspecified I95.9  . Other and unspecified hyperlipidemia E78.5  . AR (allergic rhinitis) J30.9  . Brain tumor (benign) ([place]) D33.2  . High cholesterol E78.00  . Meningioma ([place]) D32.9  . 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 Mitral valve disorders I05.9  . Non-rheumatic mitral regurgitation I34.0   No past surgical history on file. Allergies    Allergen Reactions  . Penicillins   . Tramadol    Prior to Admission medications listed by her  Medication Sig Start Date End Date Taking? Authorizing Provider  carvedilol (COREG) 6.25 MG tablet Take 6.25 mg by mouth 2 (two) times daily with a meal.   Yes Historical Provider, MD  furosemide (LASIX) 20 MG tablet Take 20 mg by mouth.   Yes Historical Provider, MD  lisinopril (PRINIVIL,ZESTRIL) 5 MG tablet Take 5 mg by mouth daily.   Yes Historical Provider, MD  rosuvastatin (CRESTOR) 10 MG tablet Take 10 mg by mouth daily.   Yes Historical Provider, MD    Current Outpatient Prescriptions from careeverywhere Medication Sig  . guaiFENesin-codeine (CHERATUSSIN AC) 100-10 mg/5 mL solution Take 10 mL by mouth four (4) times daily as needed for Cough. Max Daily Amount: 40 mL.  . calcium-vitamin D (CALCIUM 600 + D,3,) 600 mg(1,500mg ) -200 unit tab Take 1 Tab by mouth two (2) times daily (with meals).  . naltrexone-buPROPion (CONTRAVE) 8-90 mg TbER ER tablet Week 5 and Beyond: Contrave 8mg /90mg  #120 2 Tab AM, 2 Tab PM Indications: WEIGHT LOSS MANAGEMENT FOR OBESE PATIENT (BMI >= 30)  . lisinopril (PRINIVIL, ZESTRIL) 5 mg tablet TAKE ONE TABLET BY MOUTH DAILY  . carvedilol (COREG) 3.125 mg tablet TAKE ONE TABLET BY MOUTH TWICE A DAY  . furosemide (LASIX) 20 mg tablet TAKE ONE TABLET BY MOUTH DAILY  . CRESTOR 5 mg tablet TAKE ONE TABLET BY MOUTH EVERY EVENING  . albuterol (PROVENTIL HFA, VENTOLIN HFA, PROAIR HFA) 90 mcg/actuation inhaler Take 2 Puffs by inhalation every four (  4) hours as needed for Wheezing or Shortness of Breath. Indications: BRONCHOSPASM PREVENTION  . fluticasone (FLONASE) 50 mcg/actuation nasal spray 2 Sprays by Both Nostrils route daily. (Patient taking differently: 2 Sprays by Both Nostrils route daily. As needed)    Social History   Social History  . Marital Status: Married    Spouse Name: N/A  . Number of Children: N/A  . Years of Education: N/A   Occupational History   . Not on file.   Social History Main Topics  . Smoking status: Not on file  . Smokeless tobacco: Not on file  . Alcohol Use: Not on file  . Drug Use: Not on file  . Sexual Activity: Not on file   Other Topics Concern  . Not on file   Social History Narrative  . No narrative on file   Review of Systems  Constitutional: Negative for fever and chills.  Respiratory: Positive for cough, chest tightness and shortness of breath.   Gastrointestinal: Negative for nausea, vomiting and abdominal pain.    Objective:   Physical Exam  Constitutional: She is oriented to person, place, and time. She appears well-developed and well-nourished.  Apprehensive/mildly tachypneic  HENT:  Head: Normocephalic and atraumatic.  Mouth/Throat: Oropharynx is clear and moist.  Eyes: Conjunctivae and EOM are normal. Pupils are equal, round, and reactive to light.  Neck: Neck supple.  Cardiovascular:  Frequent early beats//holosystolic 0000000 LSB  Pulmonary/Chest:  Distant breath sounds,no rales Mild tachypnea  Abdominal: There is no tenderness.  Musculoskeletal: Normal range of motion. She exhibits no edema.  Lymphadenopathy:    She has no cervical adenopathy.  Neurological: She is alert and oriented to person, place, and time. No cranial nerve deficit.  Skin: Skin is warm and dry.  Psychiatric: She has a normal mood and affect. Her behavior is normal.  Nursing note and vitals reviewed.   Filed Vitals:   06/30/15 0843  BP: 122/70  Pulse: 104  Temp: 98 F (36.7 C)  Resp: 20  SpO2: 91%    CXR=There is a rounded nodular density within the medial right lower lung, potentially calcified. Additionally there is nonspecific heterogeneous opacification within the right perihilar location which may represent focal pulmonary consolidation, dilated hilar vessels or hilar mass/ adenopathy  CXR 2 mo ago in [place] suggested no nodules  EKG- abnormal with multiple PVCs/ but the t wave inversions anteriorly  were reported at her last EKG in [place]  PO2 up from 91 to 93 after exam--still sob so added 02 2L which made her comfortable/PO2 up to 98 Ht rate remains irregular with early beats rate 95  Assessment & Plan:   Chest tightness - with SOB (shortness of breath) - in a patient with ICD for cardiomyop/chr sys failure  CHF (congestive heart failure), NYHA class I, acute on chronic, combined (HCC)  Abnormal CXR with multiple nodules not seen 2 months ago  Needs emergent care to r/o decompensated CHF vs PTE, MI, Pulm mass etc  Pyuria--asymptomatic   I have completed the patient encounter in its entirety as documented by the scribe, with editing by me where necessary. Zonnie Landen P. Laney Pastor, M.D.

## 2015-07-01 ENCOUNTER — Inpatient Hospital Stay (HOSPITAL_COMMUNITY): Payer: Medicare Other

## 2015-07-01 DIAGNOSIS — E039 Hypothyroidism, unspecified: Secondary | ICD-10-CM

## 2015-07-01 DIAGNOSIS — I509 Heart failure, unspecified: Secondary | ICD-10-CM

## 2015-07-01 DIAGNOSIS — I110 Hypertensive heart disease with heart failure: Principal | ICD-10-CM

## 2015-07-01 DIAGNOSIS — G4733 Obstructive sleep apnea (adult) (pediatric): Secondary | ICD-10-CM

## 2015-07-01 DIAGNOSIS — J309 Allergic rhinitis, unspecified: Secondary | ICD-10-CM

## 2015-07-01 DIAGNOSIS — I5023 Acute on chronic systolic (congestive) heart failure: Secondary | ICD-10-CM

## 2015-07-01 LAB — TROPONIN I

## 2015-07-01 LAB — BASIC METABOLIC PANEL
ANION GAP: 11 (ref 5–15)
BUN: 13 mg/dL (ref 6–20)
CHLORIDE: 102 mmol/L (ref 101–111)
CO2: 27 mmol/L (ref 22–32)
Calcium: 9 mg/dL (ref 8.9–10.3)
Creatinine, Ser: 0.75 mg/dL (ref 0.44–1.00)
GFR calc Af Amer: >60 mL/min (ref >60)
GFR calc non Af Amer: >60 mL/min (ref >60)
GLUCOSE: 90 mg/dL (ref 65–99)
POTASSIUM: 3.7 mmol/L (ref 3.5–5.1)
SODIUM: 140 mmol/L (ref 135–145)

## 2015-07-01 LAB — ECHOCARDIOGRAM COMPLETE
Height: 69 in
Weight: 3091.2 oz

## 2015-07-01 MED ORDER — FUROSEMIDE 10 MG/ML IJ SOLN
20.0000 mg | Freq: Two times a day (BID) | INTRAMUSCULAR | Status: DC
  Administered 2015-07-01 – 2015-07-02 (×2): 20 mg via INTRAVENOUS
  Filled 2015-07-01 (×3): qty 2

## 2015-07-01 NOTE — Progress Notes (Signed)
Subjective: No acute events overnight. She reports that her chest pressure has resolved. Dyspnea improved.  Objective: Vital signs in last 24 hours: Filed Vitals:   06/30/15 1708 06/30/15 2042 07/01/15 0121 07/01/15 0443  BP: 98/64 104/57 115/69 90/70  Pulse: 95 87 86 90  Temp: 98 F (36.7 C) 98.4 F (36.9 C) 97.7 F (36.5 C) 98.2 F (36.8 C)  TempSrc: Oral Oral Oral Oral  Resp: 18 18 18 18   Height: 5\' 9"  (1.753 m)     Weight: 194 lb 10.7 oz (88.3 kg)   193 lb 3.2 oz (87.635 kg)  SpO2: 96% 98% 96% 95%   Weight change:   Intake/Output Summary (Last 24 hours) at 07/01/15 0746 Last data filed at 07/01/15 0400  Gross per 24 hour  Intake    360 ml  Output    800 ml  Net   -440 ml   General Apperance: NAD HEENT: Normocephalic, atraumatic, anicteric sclera Neck: Supple, trachea midline Lungs: Rales in bilateral bases. Breathing comfortably on room air Heart: Regular rate and rhythm, no murmur/rub/gallop Abdomen: Soft, nontender, nondistended, no rebound/guarding Extremities: Warm and well perfused, no edema Skin: No rashes or lesions Neurologic: Alert and interactive. No gross deficits.  Lab Results: Basic Metabolic Panel:  Recent Labs Lab 06/30/15 1230 07/01/15 0517  NA 142 140  K 4.7 3.7  CL 105 102  CO2 26 27  GLUCOSE 88 90  BUN 11 13  CREATININE 0.73 0.75  CALCIUM 9.3 9.0   Liver Function Tests:  Recent Labs Lab 06/30/15 1021  AST 14  ALT 16  ALKPHOS 88  BILITOT 0.8  PROT 6.7  ALBUMIN 4.2   CBC:  Recent Labs Lab 06/30/15 1026 06/30/15 1230  WBC 5.9 5.0  NEUTROABS  --  3.4  HGB 11.8* 11.4*  HCT 32.9* 35.6*  MCV 88.9 94.4  PLT  --  212   Cardiac Enzymes:  Recent Labs Lab 06/30/15 1818 07/01/15 0043  TROPONINI <0.03 <0.03   Thyroid Function Tests:  Recent Labs Lab 06/30/15 1818  TSH 0.824   Urinalysis:  Recent Labs Lab 06/30/15 1026  BILIRUBINUR negative  KETONESUR negative  PROTEINUR negative  UROBILINOGEN 0.2    NITRITE Negative  LEUKOCYTESUR large (3+)*    Studies/Results: Dg Chest 2 View  06/30/2015  CLINICAL DATA:  Patient with shortness of breath and chest tightness. EXAM: CHEST  2 VIEW COMPARISON:  None. FINDINGS: Multi lead AICD device overlies the left hemi thorax. Cardiomegaly. There is a rounded nodular opacity within the right mid lower lung. Additionally heterogeneous opacity demonstrated within the right hilar location. No pleural effusion or pneumothorax. Thoracic spine degenerative changes. IMPRESSION: There is a rounded nodular density within the medial right lower lung, potentially calcified. Additionally there is nonspecific heterogeneous opacification within the right perihilar location which may represent focal pulmonary consolidation, dilated hilar vessels or hilar mass/ adenopathy. Given the multitude of findings, and no priors for comparison, this needs correlation with contrast-enhanced chest CT. These results will be called to the ordering clinician or representative by the Radiologist Assistant, and communication documented in the PACS or zVision Dashboard. Electronically Signed   By: Lovey Newcomer M.D.   On: 06/30/2015 09:21   Ct Angio Chest Pe W/cm &/or Wo Cm  06/30/2015  CLINICAL DATA:  Shortness of breath with chest pain EXAM: CT ANGIOGRAPHY CHEST WITH CONTRAST TECHNIQUE: Multidetector CT imaging of the chest was performed using the standard protocol during bolus administration of intravenous contrast. Multiplanar CT image reconstructions  and MIPs were obtained to evaluate the vascular anatomy. CONTRAST:  100 mL Isovue 370 nonionic COMPARISON:  Chest radiograph Jun 30, 2015 FINDINGS: Mediastinum/Lymph Nodes: There is no demonstrable pulmonary embolus. There is no thoracic aortic aneurysm or dissection. The visualized great vessels appear normal. There are scattered foci of atherosclerotic calcification in the aorta. The heart is mildly enlarged with mild left ventricular hypertrophy. The  pericardium is not thickened. Pacemaker leads are attached to the right atrium and right ventricle. There is enlargement of the left lobe of the thyroid with multiple coarse calcifications in the left lobe of the thyroid. There are small mediastinal lymph nodes but no adenopathy by size criteria. Lungs/Pleura: There is a fairly small right pleural effusion with right base airspace consolidation. There is patchy atelectasis with minimal effusion on the left. There is no parenchymal lung mass appreciable; there is no pulmonary nodular lesion to account for the nodular areas seen in the inferior right hilar region on the chest radiograph. Upper abdomen: Visualized upper abdominal structures are unremarkable. Note that there is reflux of contrast into the inferior vena cava. Musculoskeletal: There is degenerative change in the thoracic spine. There are no blastic or lytic bone lesions. Review of the MIP images confirms the above findings. IMPRESSION: No demonstrable pulmonary embolus. Right pleural effusion with right base airspace consolidation. Small left effusion with left base atelectatic change. No parenchymal lung nodular lesion seen by CT.  No adenopathy. Enlargement of the left lobe of the thyroid with coarse calcifications in the left lobe of the thyroid. Reflux of contrast into the inferior vena cava may be indicative of increase in right heart pressure. Note that there is mild cardiomegaly with mild left ventricular hypertrophy. Pacemaker leads are attached to the right atrium and right ventricle. Electronically Signed   By: Lowella Grip III M.D.   On: 06/30/2015 14:34   Medications: I have reviewed the patient's current medications. Scheduled Meds: . carvedilol  3.125 mg Oral BID WC  . enoxaparin (LOVENOX) injection  40 mg Subcutaneous Q24H  . fluticasone  2 spray Each Nare Daily  . furosemide  20 mg Intravenous Q12H  . lisinopril  5 mg Oral QHS  . rosuvastatin  5 mg Oral QHS  . sodium  chloride flush  3 mL Intravenous Q12H   Continuous Infusions:  PRN Meds:.sodium chloride, acetaminophen, albuterol, ondansetron (ZOFRAN) IV, sodium chloride flush Assessment/Plan: 65 year old woman with history of cardiomyopathy, chronic CHF with reduced EF (echo 09/12/2014 with EF 123456, grade 1 diastolic dysfunction) s/p AICD in 2012, mod to severe mitral regurgitation, meningioma s/p craniotomy, HLD, allergic rhinitis, OSA, hypothyroidism, HTN presenting with dyspnea and chest pressure x 2 days.  Acute on chronic CHF with reduced EF: Echo 09/12/2014 with EF 123456, grade 1 diastolic dysfunction) s/p AICD in 2012. BNP 957.9. CTA chest with no PE, right pleural effusion with R base consolidation, small left effusion. Troponins negative x 3.  -Repeat EKG in AM pending -Repeat echo -Strict in/out, daily weights -Continue home carvedilol 3.125mg  BID and lisinopril 5mg  daily -Lasix 20 mg IV BID -Continue home Crestor 5mg  daily  Allergic rhinitis: Continue home Flonase.   OSA: not on CPAP at home.  Hypothyroidism: TSH wnl.  HTN: Normotensive in ED  VTE ppx: Subq Lovenox  Code: FULL  Dispo: Disposition is deferred at this time, awaiting improvement of current medical problems.  Anticipated discharge in approximately 0-1 day(s).   The patient does have a current PCP (Noel L Hunte) and does not need an  [facility] hospital follow-up appointment after discharge.  The patient does not have transportation limitations that hinder transportation to clinic appointments.  .Services Needed at time of discharge: Y = Yes, Blank = No PT:   OT:   RN:   Equipment:   Other:     LOS: 1 day   Milagros Loll, MD 07/01/2015, 7:46 AM

## 2015-07-01 NOTE — Progress Notes (Signed)
Subjective: Kathleen Jarvis feels well this morning with NAEON, and her symptomatic DOE has improved such that she was able to ambulate to the restroom and back without significant difficulty. Her chest pain has also resolved.   Objective: Vital signs in last 24 hours: Filed Vitals:   06/30/15 2042 07/01/15 0121 07/01/15 0443 07/01/15 0804  BP: 104/57 115/69 90/70 114/44  Pulse: 87 86 90 104  Temp: 98.4 F (36.9 C) 97.7 F (36.5 C) 98.2 F (36.8 C)   TempSrc: Oral Oral Oral   Resp: 18 18 18    Height:      Weight:   87.635 kg (193 lb 3.2 oz)   SpO2: 98% 96% 95%    Weight change:   Intake/Output Summary (Last 24 hours) at 07/01/15 1002 Last data filed at 07/01/15 0900  Gross per 24 hour  Intake    600 ml  Output    800 ml  Net   -200 ml   Physical Exam:  General: Well appearing, sitting in bed in NAD HEENT: NCAT. EOMI, sclera anicteric. Oropharynx clear, moist mucous membranes.  CV: normal sinus rhythm with occasional PVCs. II/VI systolic murmur auscultated best over apex.  Pulm: Crackes in bilateral lung bases, R>L. Slightly improved from yesterday. Remainder CTAB. SORA. Abd: Non-TTP, Soft, non-distended. No organomegaly or masses. Extremities: Warm, well-perfused, pulses 2+, symmetric distally. No peripheral edema  Interval Lab Results: BMP Latest Ref Rng 07/01/2015 06/30/2015 06/30/2015  Glucose 65 - 99 mg/dL 90 88 92  BUN 6 - 20 mg/dL 13 11 14   Creatinine 0.44 - 1.00 mg/dL 0.75 0.73 0.73  Sodium 135 - 145 mmol/L 140 142 143  Potassium 3.5 - 5.1 mmol/L 3.7 4.7 4.3  Chloride 101 - 111 mmol/L 102 105 105  CO2 22 - 32 mmol/L 27 26 30   Calcium 8.9 - 10.3 mg/dL 9.0 9.3 9.2   Recent Labs     06/30/15  1818  07/01/15  0043  TROPONINI  <0.03  <0.03    Micro Results: No results found for this or any previous visit (from the past 240 hour(s)). Studies/Results: Dg Chest 2 View  06/30/2015  CLINICAL DATA:  Patient with shortness of breath and chest tightness. EXAM: CHEST  2  VIEW COMPARISON:  None. FINDINGS: Multi lead AICD device overlies the left hemi thorax. Cardiomegaly. There is a rounded nodular opacity within the right mid lower lung. Additionally heterogeneous opacity demonstrated within the right hilar location. No pleural effusion or pneumothorax. Thoracic spine degenerative changes. IMPRESSION: There is a rounded nodular density within the medial right lower lung, potentially calcified. Additionally there is nonspecific heterogeneous opacification within the right perihilar location which may represent focal pulmonary consolidation, dilated hilar vessels or hilar mass/ adenopathy. Given the multitude of findings, and no priors for comparison, this needs correlation with contrast-enhanced chest CT. These results will be called to the ordering clinician or representative by the Radiologist Assistant, and communication documented in the PACS or zVision Dashboard. Electronically Signed   By: Lovey Newcomer M.D.   On: 06/30/2015 09:21   Ct Angio Chest Pe W/cm &/or Wo Cm  06/30/2015  CLINICAL DATA:  Shortness of breath with chest pain EXAM: CT ANGIOGRAPHY CHEST WITH CONTRAST TECHNIQUE: Multidetector CT imaging of the chest was performed using the standard protocol during bolus administration of intravenous contrast. Multiplanar CT image reconstructions and MIPs were obtained to evaluate the vascular anatomy. CONTRAST:  100 mL Isovue 370 nonionic COMPARISON:  Chest radiograph Jun 30, 2015 FINDINGS: Mediastinum/Lymph Nodes: There is  no demonstrable pulmonary embolus. There is no thoracic aortic aneurysm or dissection. The visualized great vessels appear normal. There are scattered foci of atherosclerotic calcification in the aorta. The heart is mildly enlarged with mild left ventricular hypertrophy. The pericardium is not thickened. Pacemaker leads are attached to the right atrium and right ventricle. There is enlargement of the left lobe of the thyroid with multiple coarse  calcifications in the left lobe of the thyroid. There are small mediastinal lymph nodes but no adenopathy by size criteria. Lungs/Pleura: There is a fairly small right pleural effusion with right base airspace consolidation. There is patchy atelectasis with minimal effusion on the left. There is no parenchymal lung mass appreciable; there is no pulmonary nodular lesion to account for the nodular areas seen in the inferior right hilar region on the chest radiograph. Upper abdomen: Visualized upper abdominal structures are unremarkable. Note that there is reflux of contrast into the inferior vena cava. Musculoskeletal: There is degenerative change in the thoracic spine. There are no blastic or lytic bone lesions. Review of the MIP images confirms the above findings. IMPRESSION: No demonstrable pulmonary embolus. Right pleural effusion with right base airspace consolidation. Small left effusion with left base atelectatic change. No parenchymal lung nodular lesion seen by CT.  No adenopathy. Enlargement of the left lobe of the thyroid with coarse calcifications in the left lobe of the thyroid. Reflux of contrast into the inferior vena cava may be indicative of increase in right heart pressure. Note that there is mild cardiomegaly with mild left ventricular hypertrophy. Pacemaker leads are attached to the right atrium and right ventricle. Electronically Signed   By: Lowella Grip III M.D.   On: 06/30/2015 14:34   Medications:  Scheduled Meds: . carvedilol  3.125 mg Oral BID WC  . enoxaparin (LOVENOX) injection  40 mg Subcutaneous Q24H  . fluticasone  2 spray Each Nare Daily  . furosemide  20 mg Intravenous Q12H  . lisinopril  5 mg Oral QHS  . rosuvastatin  5 mg Oral QHS  . sodium chloride flush  3 mL Intravenous Q12H   Continuous Infusions:  PRN Meds:.sodium chloride, acetaminophen, albuterol, ondansetron (ZOFRAN) IV, sodium chloride flush  Assessment/Plan: Kathleen Jarvis is a 65 yo F who with known CHF,  non-ischemic cardiomyopathy, and mitral regurgitation, who presents with s/s of a CHF exacerbation.   CHF Exacerbation: Known CHF, followed by cardiologist in [place]. Most recent echo in Fall 2016 showed EF 20-25%, improved from ~10% at diagnosis in 2001. Pt p/w SOB, DOE, and increased orthopnea. Vitals tachypneic, mildly tachycardic, and occasionally desatting to low 90s. Admission labs show BNP of 957 without known baseline value, normal CMP and CBC, and negative POC troponin. Chest CT showed mild R pleural effusion and L atelectasis without focal consolidations. Exact etiology leading to exacerbation unclear, though possibly related to mild URI symptoms experienced at the beginning of the week. EKG not suggestive of acute ischemic changes, and initial POC troponin negative, but given her concurrent CP, will repeat troponins and CTM on tele. She seems reliably compliant with her home medication regimen, and very responsive to antihypertensives/diuretics, being well-controlled on 20 Lasix OD and having previously experienced orthostatic sx on 5mg  lisinopril. Current infection seems unlikely given that she is afebrile and has nl WBC. She does have a hx of hypothyroidism, and we will check TSH. Overnight improvement in symptoms continues to support CHF exacerbation; she likely has very low reserve threshold given her EF in 15-25% range.  -Admitted to  floor for monitoring and stabilization -Tele, Pulse ox -Increase home lasix to 20 mg BID -Continue home lisinopril, coreg, statin -Echocardiogramtoday -Low threshold to consult heart failure team PRN given tenuous cardiac history -F/u AM EKG today  FEN/GI:  -Heart healthy diet -Zofran PRN  Asthma: Unclear history. Began using inhaler after recent bronchitis. -Continue home Albuterol inhaler, fluticasone PRN  Ppx:[place] lovenox  Code: Full  Dispo: Disposition is deferred at this time, awaiting improvement of current medical problems. Anticipated  discharge in approximately 1-2 day(s).   The patient does have a current PCP in [place] and does not need an [facility] hospital follow-up appointment after discharge.  The patient does not have transportation limitations that hinder transportation to clinic appointments.  This is a Careers information officer Note.  The care of the patient was discussed with Dr. Dareen Piano and the assessment and plan formulated with their assistance.  Please see their attached note for official documentation of the daily encounter.   LOS: 1 day   Konrad Penta, Med Student 07/01/2015, 10:02 AM

## 2015-07-01 NOTE — Progress Notes (Signed)
  Echocardiogram 2D Echocardiogram has been performed.  Kathleen Jarvis 07/01/2015, 11:30 AM

## 2015-07-01 NOTE — Progress Notes (Signed)
  Date: 07/01/2015  Patient name: Kathleen Jarvis  Medical record number: [id number]  Date of birth: Oct 20, 1950   I have seen and evaluated Kathleen Jarvis and discussed their care with the Residency Team. In brief, patient is a  65 y/o female with PMH of non ischemic CM with reduced EF (20%) s/p ICD placement, moderate to severe MR, OSA, HTN, meningioma s/p resection p/w SOB and CP * 2 days. Patient states she developed URI symptoms - cough, sneezing approx 5 days ago after which she noted progressive DOE and SOB and yesterday was dyspneic on minimal exertion. She also complained of assoc orthopnea. She also complained of assoc CP- worse with exertion, pressure like, left sided - near ICD site, improved with rest, no radiation. No palpitations, no diaphoresis, no n/v, no abd pain, no diarrhea, no syncope. She currently feels much better. Decreased DOE and CP has resolved.  PMHx, Fam Hx, and/or Soc Hx : as per resident admit note  Filed Vitals:   07/01/15 0804 07/01/15 1122  BP: 114/44 94/60  Pulse: 104 73  Temp:  98.2 F (36.8 C)  Resp:  18   Gen: AAO*3, NAD CVS: RRR, systolic murmur 2/6 + Lungs: bibasilar crackles + Abd: soft, non tender, BS + Ext: no edema   Assessment and Plan: I have seen and evaluated the patient as outlined above. I agree with the formulated Assessment and Plan as detailed in the residents' note, with the following changes:   1. Acute on chronic systolic CHF exacerbation: - Symptoms are likely secondary to CHF exacerbation - given orthopnea, fluid on CT as well as improvement with diuresis. Given CP and EKG changes will need to rule out unstable angina as well but less likely given CP resolved with IV diuresis and DOE has improved.  - Repeat EKG noted- T wave inversions in lateral leads similar to admission EKG except new T wave inversion in V3. Would consider cardio eval - Troponins are negative - Will f/u 2 D ECHO - c.w IV diuresis and strict I's and O's -  c/w carvedilol and lisinopril    Aldine Contes, MD 5/7/201711:41 AM

## 2015-07-01 NOTE — Consult Note (Signed)
Reason for Consult: Acute on chronic systolic heart failure  Referring Physician: Teaching service  Kathleen Jarvis is an 65 y.o. female.   HPI: The patient is a 65 year old woman with a known history of chronic systolic heart failure, nonischemic, with severe left ventricular dysfunction, who was admitted to the hospital after developing shortness of breath which was preceded by an upper respiratory illness. She was found to have pulmonary edema and was given therapy with intravenous diuresis. She has improved substantially and is almost back to baseline. She denies anginal symptoms although she does have some noncardiac chest pain. The patient denies dietary indiscretion, but she has been here in [place] for approximately 5 days having traveled from [place] to attend a religious conference. Her breathing is much improved since admission and she is lost 7 pounds. She denies medical noncompliance.  PMH: Past Medical History  Diagnosis Date  . CHF (congestive heart failure) ([place])   . Cardiac LV ejection fraction 10-20%   . Bronchitis   . High cholesterol   . Brain tumor [facility])     PSHX: Past Surgical History  Procedure Laterality Date  . Cholecystectomy      FAMHX:No family history on file.  Social History:  reports that she has never smoked. She does not have any smokeless tobacco history on file. She reports that she does not drink alcohol or use illicit drugs.  Allergies:  Allergies  Allergen Reactions  . Tramadol Other (See Comments)    hallucinations  . Penicillins Itching and Rash    Has patient had a PCN reaction causing immediate rash, facial/tongue/throat swelling, SOB or lightheadedness with hypotension: Yes Has patient had a PCN reaction causing severe rash involving mucus membranes or skin necrosis: No Has patient had a PCN reaction that required hospitalization unknown Has patient had a PCN reaction occurring within the last 10 years: No If all of the  above answers are "NO", then may proceed with Cephalosporin use.    Medications: Reviewed  Dg Chest 2 View  06/30/2015  CLINICAL DATA:  Patient with shortness of breath and chest tightness. EXAM: CHEST  2 VIEW COMPARISON:  None. FINDINGS: Multi lead AICD device overlies the left hemi thorax. Cardiomegaly. There is a rounded nodular opacity within the right mid lower lung. Additionally heterogeneous opacity demonstrated within the right hilar location. No pleural effusion or pneumothorax. Thoracic spine degenerative changes. IMPRESSION: There is a rounded nodular density within the medial right lower lung, potentially calcified. Additionally there is nonspecific heterogeneous opacification within the right perihilar location which may represent focal pulmonary consolidation, dilated hilar vessels or hilar mass/ adenopathy. Given the multitude of findings, and no priors for comparison, this needs correlation with contrast-enhanced chest CT. These results will be called to the ordering clinician or representative by the Radiologist Assistant, and communication documented in the PACS or zVision Dashboard. Electronically Signed   By: Lovey Newcomer M.D.   On: 06/30/2015 09:21   Ct Angio Chest Pe W/cm &/or Wo Cm  06/30/2015  CLINICAL DATA:  Shortness of breath with chest pain EXAM: CT ANGIOGRAPHY CHEST WITH CONTRAST TECHNIQUE: Multidetector CT imaging of the chest was performed using the standard protocol during bolus administration of intravenous contrast. Multiplanar CT image reconstructions and MIPs were obtained to evaluate the vascular anatomy. CONTRAST:  100 mL Isovue 370 nonionic COMPARISON:  Chest radiograph Jun 30, 2015 FINDINGS: Mediastinum/Lymph Nodes: There is no demonstrable pulmonary embolus. There is no thoracic aortic aneurysm or dissection. The visualized great vessels appear  normal. There are scattered foci of atherosclerotic calcification in the aorta. The heart is mildly enlarged with mild left  ventricular hypertrophy. The pericardium is not thickened. Pacemaker leads are attached to the right atrium and right ventricle. There is enlargement of the left lobe of the thyroid with multiple coarse calcifications in the left lobe of the thyroid. There are small mediastinal lymph nodes but no adenopathy by size criteria. Lungs/Pleura: There is a fairly small right pleural effusion with right base airspace consolidation. There is patchy atelectasis with minimal effusion on the left. There is no parenchymal lung mass appreciable; there is no pulmonary nodular lesion to account for the nodular areas seen in the inferior right hilar region on the chest radiograph. Upper abdomen: Visualized upper abdominal structures are unremarkable. Note that there is reflux of contrast into the inferior vena cava. Musculoskeletal: There is degenerative change in the thoracic spine. There are no blastic or lytic bone lesions. Review of the MIP images confirms the above findings. IMPRESSION: No demonstrable pulmonary embolus. Right pleural effusion with right base airspace consolidation. Small left effusion with left base atelectatic change. No parenchymal lung nodular lesion seen by CT.  No adenopathy. Enlargement of the left lobe of the thyroid with coarse calcifications in the left lobe of the thyroid. Reflux of contrast into the inferior vena cava may be indicative of increase in right heart pressure. Note that there is mild cardiomegaly with mild left ventricular hypertrophy. Pacemaker leads are attached to the right atrium and right ventricle. Electronically Signed   By: Lowella Grip III M.D.   On: 06/30/2015 14:34    ROS  As stated in the HPI and negative for all other systems.  Physical Exam  Vitals:Blood pressure 94/60, pulse 73, temperature 98.2 F (36.8 C), temperature source Oral, resp. rate 18, height 5\' 9"  (1.753 m), weight 193 lb 3.2 oz (87.635 kg), SpO2 99 %.  Obese but otherwise well appearing  woman, NAD HEENT: Unremarkable Neck:  7 cm JVD, no thyromegally Lymphatics:  No adenopathy Back:  No CVA tenderness Lungs:  Clear, with no wheezes, rales, or rhonchi. HEART:  Regular rate rhythm, no murmurs, no rubs, no clicks Abd:  Flat, positive bowel sounds, no organomegally, no rebound, no guarding Ext:  2 plus pulses, trace peripheral edema, no cyanosis, no clubbing Skin:  No rashes no nodules Neuro:  CN II through XII intact, motor grossly intact    Assessment/Plan: 1. Acute on chronic systolic heart failure - her ejection fraction is severely impaired. She has had a nice diuresis since hospitalization. She is approaching euvolemia. I would recommend another day of IV Lasix and then allow her to be discharged home. She will need early follow-up with her cardiologist in [place]. The importance of maintaining a low-sodium diet was emphasized. At this point, she is on good medical therapy. At discharge, I would suggest 40 mg of Lasix daily. 2. Noncardiac chest pain - no indication to work this up with her prior history of nonischemic cardiomyopathy 3. Hypertensive heart disease - on medical therapy, her blood pressure is actually low normal.  Carleene Overlie TaylorMD 07/01/2015, 2:17 PM

## 2015-07-02 DIAGNOSIS — Z95810 Presence of automatic (implantable) cardiac defibrillator: Secondary | ICD-10-CM

## 2015-07-02 DIAGNOSIS — E785 Hyperlipidemia, unspecified: Secondary | ICD-10-CM

## 2015-07-02 DIAGNOSIS — I5043 Acute on chronic combined systolic (congestive) and diastolic (congestive) heart failure: Secondary | ICD-10-CM | POA: Insufficient documentation

## 2015-07-02 DIAGNOSIS — J45909 Unspecified asthma, uncomplicated: Secondary | ICD-10-CM

## 2015-07-02 DIAGNOSIS — Z8709 Personal history of other diseases of the respiratory system: Secondary | ICD-10-CM

## 2015-07-02 LAB — BASIC METABOLIC PANEL
Anion gap: 12 (ref 5–15)
BUN: 19 mg/dL (ref 6–20)
CALCIUM: 9 mg/dL (ref 8.9–10.3)
CO2: 27 mmol/L (ref 22–32)
CREATININE: 0.79 mg/dL (ref 0.44–1.00)
Chloride: 101 mmol/L (ref 101–111)
GFR calc non Af Amer: >60 mL/min (ref >60)
GLUCOSE: 115 mg/dL — AB (ref 65–99)
Potassium: 3.9 mmol/L (ref 3.5–5.1)
Sodium: 140 mmol/L (ref 135–145)

## 2015-07-02 MED ORDER — CARVEDILOL 3.125 MG PO TABS: 3.1250 mg | ORAL_TABLET | Freq: Two times a day (BID) | ORAL | Status: AC

## 2015-07-02 MED ORDER — FUROSEMIDE 20 MG PO TABS: 40.0000 mg | ORAL_TABLET | Freq: Every day | ORAL | Status: AC

## 2015-07-02 MED ORDER — LISINOPRIL 5 MG PO TABS: 2.5000 mg | ORAL_TABLET | Freq: Every day | ORAL | Status: AC

## 2015-07-02 NOTE — Progress Notes (Signed)
Patient ID: Kathleen Jarvis, female   DOB: [birth year], 65 y.o.   MRN: [id number] Medicine attending discharge note: I personally examined this patient on the day of discharge and I attests to the accuracy of the discharge evaluation and plan as recorded in the daily progress note dated 07/02/2015 by resident physician Dr. Jacques Earthly.  Clinical summary: 65 year old woman visiting  [place] for a conference when she developed progressive dyspnea and orthopnea concomitant with an upper respiratory illness occurring over the 5 days prior to admission. She has a history of a advanced nonischemic cardiomyopathy with estimated ejection fraction around 25% and mitral regurgitation. Status post AICD device placement. She has chronically low low pressure. On initial exam there was jugular venous distention 1-2 centimeters, Crackles at both lung bases right greater than left, regular cardiac rhythm with occasional irregular beat. 2/6 systolic murmur loudest at the cardiac apex. No peripheral edema. BNP 958 with no baseline. Troponin undetectable 3. EKG with sinus rhythm, nonspecific intraventricular conduction defect, borderline prolongation of the QT interval, pronounced ST depression in lead V2, T-wave inversions in leads V2 through V6 with no comparison tracing. Chest x-ray with cardiomegaly, atypical rounded nodular opacity right mid lower lung with additional nonspecific heterogeneous opacification in the right perihilar region. No gross vascular congestion.  Hospital course: She was started on IV diuretics. She had rapid clinical improvement. A echocardiogram showed systolic ejection fraction of only 15% with global hypokinesis, a severely dilated ventricle with normal wall thickness, grade 1 diastolic dysfunction, marked left atrial enlargement, and moderate mitral regurgitation. She was seen in consultation by cardiology. Cardiologist agreed with current management. She was diuresed down to her her  approximate dry weight of 193 pounds. Weight 200 pounds on admission. There were no serial changes on EKG.  Disposition: Condition stable at time of discharge. Lungs are clear. No S3 cardiac gallop. No JVD. No peripheral edema. We have doubled her diuretic dose from 20 mg daily to 40 mg. She will return to [place] with her husband. Follow-up with her cardiologist tomorrow. We discussed potential etiologies of her nonischemic cardiomyopathy with her and her husband to include possible prior viral myocarditis versus transthyretin amyloidosis versus idiopathic. We will defer further evaluation to her cardiologist. There were no complications.

## 2015-07-02 NOTE — Progress Notes (Signed)
Subjective: No acute events overnight. Dyspnea improved. Denies chest pain.   Objective: Vital signs in last 24 hours: Filed Vitals:   07/01/15 1556 07/01/15 2152 07/01/15 2300 07/02/15 0612  BP: 98/55 92/66 108/48 99/60  Pulse: 88 92 86 85  Temp: 97.7 F (36.5 C) 98 F (36.7 C)  98 F (36.7 C)  TempSrc: Oral Oral  Oral  Resp: 19 18  19   Height:      Weight:    193 lb 1.6 oz (87.59 kg)  SpO2: 94% 97%  96%   Weight change: -6 lb 14.4 oz (-3.13 kg)  Intake/Output Summary (Last 24 hours) at 07/02/15 [id number] Last data filed at 07/01/15 2300  Gross per 24 hour  Intake    560 ml  Output   1050 ml  Net   -490 ml   General Apperance: NAD HEENT: Normocephalic, atraumatic, anicteric sclera Neck: Supple, trachea midline Lungs: Clear to auscultation bilaterally. Breathing comfortably on room air Heart: Regular rate and rhythm, no murmur/rub/gallop Abdomen: Soft, nontender, nondistended, no rebound/guarding Extremities: Warm and well perfused, no edema Skin: No rashes or lesions Neurologic: Alert and interactive. No gross deficits.  Lab Results: Basic Metabolic Panel:  Recent Labs Lab 07/01/15 0517 07/02/15 0314  NA 140 140  K 3.7 3.9  CL 102 101  CO2 27 27  GLUCOSE 90 115*  BUN 13 19  CREATININE 0.75 0.79  CALCIUM 9.0 9.0   Liver Function Tests:  Recent Labs Lab 06/30/15 1021  AST 14  ALT 16  ALKPHOS 88  BILITOT 0.8  PROT 6.7  ALBUMIN 4.2   CBC:  Recent Labs Lab 06/30/15 1026 06/30/15 1230  WBC 5.9 5.0  NEUTROABS  --  3.4  HGB 11.8* 11.4*  HCT 32.9* 35.6*  MCV 88.9 94.4  PLT  --  212   Cardiac Enzymes:  Recent Labs Lab 06/30/15 1818 07/01/15 0043  TROPONINI <0.03 <0.03   Thyroid Function Tests:  Recent Labs Lab 06/30/15 1818  TSH 0.824   Urinalysis:  Recent Labs Lab 06/30/15 1026  BILIRUBINUR negative  KETONESUR negative  PROTEINUR negative  UROBILINOGEN 0.2  NITRITE Negative  LEUKOCYTESUR large (3+)*     Studies/Results: Dg Chest 2 View  06/30/2015  CLINICAL DATA:  Patient with shortness of breath and chest tightness. EXAM: CHEST  2 VIEW COMPARISON:  None. FINDINGS: Multi lead AICD device overlies the left hemi thorax. Cardiomegaly. There is a rounded nodular opacity within the right mid lower lung. Additionally heterogeneous opacity demonstrated within the right hilar location. No pleural effusion or pneumothorax. Thoracic spine degenerative changes. IMPRESSION: There is a rounded nodular density within the medial right lower lung, potentially calcified. Additionally there is nonspecific heterogeneous opacification within the right perihilar location which may represent focal pulmonary consolidation, dilated hilar vessels or hilar mass/ adenopathy. Given the multitude of findings, and no priors for comparison, this needs correlation with contrast-enhanced chest CT. These results will be called to the ordering clinician or representative by the Radiologist Assistant, and communication documented in the PACS or zVision Dashboard. Electronically Signed   By: Lovey Newcomer M.D.   On: 06/30/2015 09:21   Ct Angio Chest Pe W/cm &/or Wo Cm  06/30/2015  CLINICAL DATA:  Shortness of breath with chest pain EXAM: CT ANGIOGRAPHY CHEST WITH CONTRAST TECHNIQUE: Multidetector CT imaging of the chest was performed using the standard protocol during bolus administration of intravenous contrast. Multiplanar CT image reconstructions and MIPs were obtained to evaluate the vascular anatomy. CONTRAST:  100  mL Isovue 370 nonionic COMPARISON:  Chest radiograph Jun 30, 2015 FINDINGS: Mediastinum/Lymph Nodes: There is no demonstrable pulmonary embolus. There is no thoracic aortic aneurysm or dissection. The visualized great vessels appear normal. There are scattered foci of atherosclerotic calcification in the aorta. The heart is mildly enlarged with mild left ventricular hypertrophy. The pericardium is not thickened. Pacemaker leads  are attached to the right atrium and right ventricle. There is enlargement of the left lobe of the thyroid with multiple coarse calcifications in the left lobe of the thyroid. There are small mediastinal lymph nodes but no adenopathy by size criteria. Lungs/Pleura: There is a fairly small right pleural effusion with right base airspace consolidation. There is patchy atelectasis with minimal effusion on the left. There is no parenchymal lung mass appreciable; there is no pulmonary nodular lesion to account for the nodular areas seen in the inferior right hilar region on the chest radiograph. Upper abdomen: Visualized upper abdominal structures are unremarkable. Note that there is reflux of contrast into the inferior vena cava. Musculoskeletal: There is degenerative change in the thoracic spine. There are no blastic or lytic bone lesions. Review of the MIP images confirms the above findings. IMPRESSION: No demonstrable pulmonary embolus. Right pleural effusion with right base airspace consolidation. Small left effusion with left base atelectatic change. No parenchymal lung nodular lesion seen by CT.  No adenopathy. Enlargement of the left lobe of the thyroid with coarse calcifications in the left lobe of the thyroid. Reflux of contrast into the inferior vena cava may be indicative of increase in right heart pressure. Note that there is mild cardiomegaly with mild left ventricular hypertrophy. Pacemaker leads are attached to the right atrium and right ventricle. Electronically Signed   By: Lowella Grip III M.D.   On: 06/30/2015 14:34   Medications: I have reviewed the patient's current medications. Scheduled Meds: . carvedilol  3.125 mg Oral BID WC  . enoxaparin (LOVENOX) injection  40 mg Subcutaneous Q24H  . fluticasone  2 spray Each Nare Daily  . furosemide  20 mg Intravenous BID  . lisinopril  5 mg Oral QHS  . rosuvastatin  5 mg Oral QHS  . sodium chloride flush  3 mL Intravenous Q12H   Continuous  Infusions:  PRN Meds:.sodium chloride, acetaminophen, albuterol, ondansetron (ZOFRAN) IV, sodium chloride flush Assessment/Plan: 65 year old woman with history of cardiomyopathy, chronic CHF with reduced EF (echo 09/12/2014 with EF 123456, grade 1 diastolic dysfunction) s/p AICD in 2012, mod to severe mitral regurgitation, meningioma s/p craniotomy, HLD, allergic rhinitis, OSA, hypothyroidism, HTN presenting with dyspnea and chest pressure x 2 days.  Acute on chronic CHF with reduced EF: Echo 09/12/2014 with EF 123456, grade 1 diastolic dysfunction) s/p AICD in 2012. BNP 957.9. CTA chest with no PE, right pleural effusion with R base consolidation, small left effusion. Troponins negative x 3 and repeat EKG with nonspecific T wave changes. Echo with LV EF 0000000, diastolic dysfunction, diffuse hypokinesis. 1.1 L UOP yesterday and -930 since admission. Weight down to 193 from 200 on admission. -Continue home carvedilol 3.125mg  BID and lisinopril 5mg  daily -Lasix 40mg  PO at home -Continue home Crestor 5mg  daily -She has follow up with her cardiologist tomorrow.  Allergic rhinitis: Continue home Flonase.   OSA: not on CPAP at home.  Hypothyroidism: TSH wnl.  HTN: Low to normotensive.  VTE ppx: Subq Lovenox  Code: FULL  Dispo: Disposition is deferred at this time, awaiting improvement of current medical problems.  Anticipated discharge in approximately 0-1 day(s).  The patient does have a current PCP (Noel L Hunte) and does not need an [facility] hospital follow-up appointment after discharge.  The patient does not have transportation limitations that hinder transportation to clinic appointments.  .Services Needed at time of discharge: Y = Yes, Blank = No PT:   OT:   RN:   Equipment:   Other:     LOS: 2 days   Milagros Loll, MD 07/02/2015, 7:33 AM

## 2015-07-02 NOTE — Progress Notes (Signed)
Subjective: Kathleen Jarvis feels great this morning. Her DOE has improved such that she is able to ambulate and bathe herself without issue, and she has had complete resolution of the chest pain present on admission.   Objective: Vital signs in last 24 hours: Filed Vitals:   07/01/15 2152 07/01/15 2300 07/02/15 0612 07/02/15 1113  BP: 92/66 108/48 99/60 88/57   Pulse: 92 86 85 86  Temp: 98 F (36.7 C)  98 F (36.7 C) 97.5 F (36.4 C)  TempSrc: Oral  Oral Oral  Resp: 18  19 18   Height:      Weight:   87.59 kg (193 lb 1.6 oz)   SpO2: 97%  96% 100%   Weight change: -3.13 kg (-6 lb 14.4 oz)  Intake/Output Summary (Last 24 hours) at 07/02/15 1524 Last data filed at 07/02/15 1403  Gross per 24 hour  Intake    662 ml  Output    500 ml  Net    162 ml   General: Well appearing, sitting in chair in NAD HEENT: NCAT. EOMI, sclera anicteric. Oropharynx clear, moist mucous membranes.  CV: normal sinus rhythm with occasional PVCs. II/VI systolic murmur auscultated best over apex.  Pulm: Crackes in bilateral lung bases, R>L. Slightly improved from yesterday. Remainder CTAB. SORA. Abd: Non-TTP, Soft, non-distended. No organomegaly or masses. Extremities: Warm, well-perfused, pulses 2+, symmetric distally. No peripheral edema  Interval Lab Results: BMP Latest Ref Rng 07/02/2015 07/01/2015 06/30/2015  Glucose 65 - 99 mg/dL 115(H) 90 88  BUN 6 - 20 mg/dL 19 13 11   Creatinine 0.44 - 1.00 mg/dL 0.79 0.75 0.73  Sodium 135 - 145 mmol/L 140 140 142  Potassium 3.5 - 5.1 mmol/L 3.9 3.7 4.7  Chloride 101 - 111 mmol/L 101 102 105  CO2 22 - 32 mmol/L 27 27 26   Calcium 8.9 - 10.3 mg/dL 9.0 9.0 9.3   Micro Results: No results found for this or any previous visit (from the past 240 hour(s)). Studies/Results: No results found. Medications:  Scheduled Meds: . carvedilol  3.125 mg Oral BID WC  . enoxaparin (LOVENOX) injection  40 mg Subcutaneous Q24H  . fluticasone  2 spray Each Nare Daily  . lisinopril   5 mg Oral QHS  . rosuvastatin  5 mg Oral QHS  . sodium chloride flush  3 mL Intravenous Q12H   Continuous Infusions:  PRN Meds:.sodium chloride, acetaminophen, albuterol, ondansetron (ZOFRAN) IV, sodium chloride flush  Assessment/Plan: Ms. Stallcup is a 65 yo F who with known CHF, non-ischemic cardiomyopathy, and mitral regurgitation, who presents with s/s of a CHF exacerbation.   CHF Exacerbation: Known CHF, followed by cardiologist in [place]. Echo this admission showed EF 15%, with prior studies being between 10-25% since her diagnosis in 2001. Pt p/w SOB, DOE, and increased orthopnea. Vitals tachypneic, mildly tachycardic, and occasionally desatting to low 90s. Admission labs show BNP of 957 without known baseline value, normal CMP and CBC, and negative POC troponin. Chest CT showed mild R pleural effusion and L atelectasis without focal consolidations. Exact etiology leading to exacerbation unclear, though possibly related to mild URI symptoms experienced at the beginning of the week. EKG not suggestive of acute ischemic changes, and initial POC troponin negative, but given her concurrent CP, will repeat troponins and CTM on tele. She seems reliably compliant with her home medication regimen, and very responsive to antihypertensives/diuretics, being well-controlled on 20 Lasix OD and having previously experienced orthostatic sx on 5mg  lisinopril. Current infection seems unlikely given that she is  afebrile and has nl WBC. TSH and repeat EKG are unremarkable. Overnight improvement in symptoms continues to support CHF exacerbation; she likely has very low reserve threshold given her EF in 15% range. Cardiology consult recommended increasing home lasix to 40 mg OD upon discharge.  -Admitted to floor for monitoring and stabilization -Tele, Pulse ox -Increase home lasix to 20 mg BID -Continue home lisinopril, coreg, statin -Low threshold to consult heart failure team PRN given tenuous cardiac  history  FEN/GI:  -Heart healthy diet -Zofran PRN  Asthma: Unclear history. Began using inhaler after recent bronchitis. -Continue home Albuterol inhaler, fluticasone PRN  Ppx:[place] lovenox  Code: Full  Dispo: Disposition is deferred at this time, awaiting improvement of current medical problems. Anticipated discharge is today.   The patient does have a current PCP in [place] and does not need an [facility] hospital follow-up appointment after discharge.  The patient does not have transportation limitations that hinder transportation to clinic appointments.  This is a Careers information officer Note.  The care of the patient was discussed with Dr. Beryle Beams and the assessment and plan formulated with their assistance.  Please see their attached note for official documentation of the daily encounter.   LOS: 2 days   Konrad Penta, Med Student 07/02/2015, 3:24 PM

## 2015-07-02 NOTE — Discharge Planning (Cosign Needed)
Discharge Diagnosis: 1. Congestive heart failure (CHF) 2. CHF Exacerbation 3. Asthma 4. HLD 5. H/o Bronchitis    Disposition and follow-up:   [name] was discharged from [facility] in Stable condition.  At the hospital follow up visit please address:  1.  Congestive heart failure exacerbation: Repeat Echo performed in the hospital showed EF of 15% and grade 1 diastolic dysfunction with elevated filling pressure. BNP on admission 957. Inpatient cardiology consult recommended decreasing her Lisinopril to 2.5mg  OD and increasing Lasix to 40 mg OD on discharge given her past history of hypotension and very low physiologic reserve. Please f/u these medication changes as needed.   2.  Labs / imaging needed at time of follow-up: None. BMP, BNP to follow resolution as needed.   3.  Pending labs/ test needing follow-up: None   Admission HPI:  65 year old woman with history of cardiomyopathy, chronic CHF with reduced EF (echo 09/12/2014 with EF 123456, grade 1 diastolic dysfunction) s/p AICD in 2012, mod to severe mitral regurgitation, meningioma s/p craniotomy in 2014, HLD, allergic rhinitis, OSA, hypothyroidism, HTN presenting with dyspnea and chest pressure x 2 days.  Developed a cough and sneezing on Tuesday. The following day she developed chest discomfort with sneezing and dyspnea. By Thursday, her dyspnea had worsened to being unbearable on Friday. +orthopnea. Presently can only walk a few feet before getting short of breath.   Chest pain on left side around area of ICD. Felt like weight on chest, pressure. No radiation. Worse with moving around. Resting makes it better. Intermittent at first but then became more constant last night. Pressure improving today.  Took Lasix this morning.   No lightheadedness or dizziness. +fatigue. No leg edema. Urinating without difficulty.  Bronchitis in March - took antibiotics for this. Resolved after this.  Hospital Course by  problem list: Active Problems:   CHF (congestive heart failure) (HCC)   CHF exacerbation (HCC)   Systolic and diastolic CHF, acute on chronic ([place])   1. Congestive heart failure exacerbation: Patient presented with increased SOB, DOE, CP and increased orthopnea x5 days. Found on CT to have small R pleural effusion and L atelectasis, consistent with bibasilar crackles observed on exam. Echocardiogram showed EF of 15% consistent with previous reports. Unclear underlying etiology, as workup for ACS, infection, medication non-adherence, thyroid, or metabolic derangement noncontributory. Her exam and symptoms improved with IV diuresis and she was clinically stable for discharge on 07/02/15 with follow-up scheduled with her PCP the following day.   2. Hypertension: She was normotensive on presentation. Given the increased lasix dosage, her home lisinopril 5mg  daily was continued. She experienced no hypertension, and was borderline hypotensive to the 0000000 systolic, consistent/ with her past history of good response to antihypertensives. She was stable and ready for discharge on 07/02/15.

## 2015-07-02 NOTE — Progress Notes (Signed)
Chaplain stopped to visit Pt. And supported pt via prayer.

## 2015-07-02 NOTE — Progress Notes (Signed)
Patient Name: Kathleen Jarvis Date of Encounter: 07/02/2015  Active Problems:   CHF (congestive heart failure) ([place])   CHF exacerbation ([place])   Length of Stay: 2  SUBJECTIVE  Feels much better, back to baseline. No edema or dyspnea. Retrospectively, food she ate on the road driving here from [place], [place] may have been salt rich.  CURRENT MEDS . carvedilol  3.125 mg Oral BID WC  . enoxaparin (LOVENOX) injection  40 mg Subcutaneous Q24H  . fluticasone  2 spray Each Nare Daily  . lisinopril  5 mg Oral QHS  . rosuvastatin  5 mg Oral QHS  . sodium chloride flush  3 mL Intravenous Q12H    OBJECTIVE   Intake/Output Summary (Last 24 hours) at 07/02/15 1246 Last data filed at 07/02/15 0858  Gross per 24 hour  Intake    440 ml  Output    500 ml  Net    -60 ml   Filed Weights   06/30/15 1708 07/01/15 0443 07/02/15 0612  Weight: 88.3 kg (194 lb 10.7 oz) 87.635 kg (193 lb 3.2 oz) 87.59 kg (193 lb 1.6 oz)    PHYSICAL EXAM Filed Vitals:   07/01/15 2152 07/01/15 2300 07/02/15 0612 07/02/15 1113  BP: 92/66 108/48 99/60 88/57   Pulse: 92 86 85 86  Temp: 98 F (36.7 C)  98 F (36.7 C) 97.5 F (36.4 C)  TempSrc: Oral  Oral Oral  Resp: 18  19 18   Height:      Weight:   87.59 kg (193 lb 1.6 oz)   SpO2: 97%  96% 100%   General: Alert, oriented x3, no distress Head: no evidence of trauma, PERRL, EOMI, no exophtalmos or lid lag, no myxedema, no xanthelasma; normal ears, nose and oropharynx Neck: normal jugular venous pulsations and no hepatojugular reflux; brisk carotid pulses without delay and no carotid bruits Chest: clear to auscultation, no signs of consolidation by percussion or palpation, normal fremitus, symmetrical and full respiratory excursions Cardiovascular: laterally displaced apical impulse, regular rhythm, normal first and second heart sounds, no rubs or gallops, no murmur Abdomen: no tenderness or distention, no masses by palpation, no abnormal pulsatility or arterial  bruits, normal bowel sounds, no hepatosplenomegaly Extremities: no clubbing, cyanosis or edema; 2+ radial, ulnar and brachial pulses bilaterally; 2+ right femoral, posterior tibial and dorsalis pedis pulses; 2+ left femoral, posterior tibial and dorsalis pedis pulses; no subclavian or femoral bruits Neurological: grossly nonfocal  LABS  CBC  Recent Labs  06/30/15 1026 06/30/15 1230  WBC 5.9 5.0  NEUTROABS  --  3.4  HGB 11.8* 11.4*  HCT 32.9* 35.6*  MCV 88.9 94.4  PLT  --  99991111   Basic Metabolic Panel  Recent Labs  07/01/15 0517 07/02/15 0314  NA 140 140  K 3.7 3.9  CL 102 101  CO2 27 27  GLUCOSE 90 115*  BUN 13 19  CREATININE 0.75 0.79  CALCIUM 9.0 9.0   Liver Function Tests  Recent Labs  06/30/15 1021  AST 14  ALT 16  ALKPHOS 88  BILITOT 0.8  PROT 6.7  ALBUMIN 4.2   No results for input(s): LIPASE, AMYLASE in the last 72 hours. Cardiac Enzymes  Recent Labs  06/30/15 1818 07/01/15 0043  TROPONINI <0.03 <0.03  Thyroid Function Tests  Recent Labs  06/30/15 1818  TSH 0.824   Radiology Studies Imaging results have been reviewed and Ct Angio Chest Pe W/cm &/or Wo Cm  06/30/2015  CLINICAL DATA:  Shortness of breath with chest  pain EXAM: CT ANGIOGRAPHY CHEST WITH CONTRAST TECHNIQUE: Multidetector CT imaging of the chest was performed using the standard protocol during bolus administration of intravenous contrast. Multiplanar CT image reconstructions and MIPs were obtained to evaluate the vascular anatomy. CONTRAST:  100 mL Isovue 370 nonionic COMPARISON:  Chest radiograph Jun 30, 2015 FINDINGS: Mediastinum/Lymph Nodes: There is no demonstrable pulmonary embolus. There is no thoracic aortic aneurysm or dissection. The visualized great vessels appear normal. There are scattered foci of atherosclerotic calcification in the aorta. The heart is mildly enlarged with mild left ventricular hypertrophy. The pericardium is not thickened. Pacemaker leads are attached to the  right atrium and right ventricle. There is enlargement of the left lobe of the thyroid with multiple coarse calcifications in the left lobe of the thyroid. There are small mediastinal lymph nodes but no adenopathy by size criteria. Lungs/Pleura: There is a fairly small right pleural effusion with right base airspace consolidation. There is patchy atelectasis with minimal effusion on the left. There is no parenchymal lung mass appreciable; there is no pulmonary nodular lesion to account for the nodular areas seen in the inferior right hilar region on the chest radiograph. Upper abdomen: Visualized upper abdominal structures are unremarkable. Note that there is reflux of contrast into the inferior vena cava. Musculoskeletal: There is degenerative change in the thoracic spine. There are no blastic or lytic bone lesions. Review of the MIP images confirms the above findings. IMPRESSION: No demonstrable pulmonary embolus. Right pleural effusion with right base airspace consolidation. Small left effusion with left base atelectatic change. No parenchymal lung nodular lesion seen by CT.  No adenopathy. Enlargement of the left lobe of the thyroid with coarse calcifications in the left lobe of the thyroid. Reflux of contrast into the inferior vena cava may be indicative of increase in right heart pressure. Note that there is mild cardiomegaly with mild left ventricular hypertrophy. Pacemaker leads are attached to the right atrium and right ventricle. Electronically Signed   By: Lowella Grip III M.D.   On: 06/30/2015 14:34    TELE NSR   ASSESSMENT AND PLAN   Improved acute exacerbation of chronic systolic HF due to severe nonischemic cardiomyopathy. Reviewed sodium restriction, weight monitoring, signs and symptoms of HF exacerbation. She will f/u with her physicians in [place].  Sanda Klein, MD, [facility] [phone] office [phone] pager 07/02/2015 12:46 PM

## 2015-07-02 NOTE — Discharge Instructions (Signed)
Medication changes:  Take carvedilol 3.125 mg by mouth 2 (two) times daily with a meal.    Take furosemide (LASIX) 2 tablets (40 mg total) by mouth daily.    Take lisinopril (PRINIVIL,ZESTRIL) HALF tablet (2.5 mg total) by mouth at bedtime.  Please follow up with your cardiologist as soon as possible.

## 2015-07-02 NOTE — Discharge Summary (Signed)
Name: Kathleen Jarvis MRN: [id number] DOB: Dec 31, 1950 65 y.o. PCP: Thana Farr  Date of Admission: 06/30/2015 11:55 AM Date of Discharge: 07/02/2015 Attending Physician: Annia Belt, MD  Discharge Diagnosis: 1. Acute exacerbation of congestive heart failure (CHF) 2. Asthma 3. HLD 4. H/o Bronchitis  Discharge Medications:   Medication List    TAKE these medications        albuterol 108 (90 Base) MCG/ACT inhaler  Commonly known as:  PROVENTIL HFA;VENTOLIN HFA  Inhale 2 puffs into the lungs every 4 (four) hours as needed for wheezing or shortness of breath.     ALEVE 220 MG tablet  Generic drug:  naproxen sodium  Take 220 mg by mouth daily as needed (pain).     carvedilol 3.125 MG tablet  Commonly known as:  COREG  Take 1 tablet (3.125 mg total) by mouth 2 (two) times daily with a meal.     CRESTOR 5 MG tablet  Generic drug:  rosuvastatin  Take 5 mg by mouth at bedtime.     furosemide 20 MG tablet  Commonly known as:  LASIX  Take 2 tablets (40 mg total) by mouth daily.     lisinopril 5 MG tablet  Commonly known as:  PRINIVIL,ZESTRIL  Take 0.5 tablets (2.5 mg total) by mouth at bedtime.     Vitamin D3-Vitamin C 1000-500 UNIT-MG Caps  Take 1 capsule by mouth 2 (two) times daily.        Disposition and follow-up:   [name] was discharged from [facility] in Stable condition.  At the hospital follow up visit please address:  1.  Congestive heart failure exacerbation: Repeat Echo performed in the hospital showed EF of 15% and grade 1 diastolic dysfunction with elevated filling pressure. BNP on admission 957. Inpatient cardiology consult recommended increasing Lasix to 40 mg OD on discharge.  Given her past history of hypotension and very low physiologic reserve, her lisinopril was decreased to 2.5mg  daily. Please f/u these medication changes as needed.   2.  Labs / imaging needed at time of follow-up: BMP, consider baseline  BNP  3.  Pending labs/ test needing follow-up: None  Follow-up Appointments:     Follow-up Information    Follow up with Ninfa Meeker Hunte. Schedule an appointment as soon as possible for a visit in 2 weeks.   Specialty:  Internal Medicine   Why:  For Hospital Follow Up   Contact information:   [place] [address] [phone]       Schedule an appointment as soon as possible for a visit with Tona Sensing.   Specialties:  Cardiology, Internal Medicine   Why:  For Heart Failure Follow Up   Contact information:   [place] [address] [phone]       Discharge Instructions: Discharge Instructions    ([place]) Call MD:  Anytime you have any of the following symptoms: 1) 3 pound weight gain in 24 hours or 5 pounds in 1 week 2) shortness of breath, with or without a dry hacking cough 3) swelling in the hands, feet or stomach 4) if you have to sleep on extra pillows at night in order to breathe.    Complete by:  As directed      Call MD for:  difficulty breathing, headache or visual disturbances    Complete by:  As directed      Call MD for:  extreme fatigue    Complete by:  As directed      Call MD for:  persistant dizziness or light-headedness    Complete by:  As directed      Call MD for:  persistant nausea and vomiting    Complete by:  As directed      Call MD for:  severe uncontrolled pain    Complete by:  As directed      Diet - low sodium heart healthy    Complete by:  As directed      Increase activity slowly    Complete by:  As directed            Consultations: Cardiology  Procedures Performed:  Dg Chest 2 View  06/30/2015  CLINICAL DATA:  Patient with shortness of breath and chest tightness. EXAM: CHEST  2 VIEW COMPARISON:  None. FINDINGS: Multi lead AICD device overlies the left hemi thorax. Cardiomegaly. There is a rounded nodular opacity within the right mid lower lung.  Additionally heterogeneous opacity demonstrated within the right hilar location. No pleural effusion or pneumothorax. Thoracic spine degenerative changes. IMPRESSION: There is a rounded nodular density within the medial right lower lung, potentially calcified. Additionally there is nonspecific heterogeneous opacification within the right perihilar location which may represent focal pulmonary consolidation, dilated hilar vessels or hilar mass/ adenopathy. Given the multitude of findings, and no priors for comparison, this needs correlation with contrast-enhanced chest CT. These results will be called to the ordering clinician or representative by the Radiologist Assistant, and communication documented in the PACS or zVision Dashboard. Electronically Signed   By: Lovey Newcomer M.D.   On: 06/30/2015 09:21   Ct Angio Chest Pe W/cm &/or Wo Cm  06/30/2015  CLINICAL DATA:  Shortness of breath with chest pain EXAM: CT ANGIOGRAPHY CHEST WITH CONTRAST TECHNIQUE: Multidetector CT imaging of the chest was performed using the standard protocol during bolus administration of intravenous contrast. Multiplanar CT image reconstructions and MIPs were obtained to evaluate the vascular anatomy. CONTRAST:  100 mL Isovue 370 nonionic COMPARISON:  Chest radiograph Jun 30, 2015 FINDINGS: Mediastinum/Lymph Nodes: There is no demonstrable pulmonary embolus. There is no thoracic aortic aneurysm or dissection. The visualized great vessels appear normal. There are scattered foci of atherosclerotic calcification in the aorta. The heart is mildly enlarged with mild left ventricular hypertrophy. The pericardium is not thickened. Pacemaker leads are attached to the right atrium and right ventricle. There is enlargement of the left lobe of the thyroid with multiple coarse calcifications in the left lobe of the thyroid. There are small mediastinal lymph nodes but no adenopathy by size criteria. Lungs/Pleura: There is a fairly small right pleural  effusion with right base airspace consolidation. There is patchy atelectasis with minimal effusion on the left. There is no parenchymal lung mass appreciable; there is no pulmonary nodular lesion to account for the nodular areas seen in the inferior right hilar region on the chest radiograph. Upper abdomen: Visualized upper abdominal structures are unremarkable. Note that there is reflux of contrast into the inferior vena cava. Musculoskeletal: There is degenerative change in the thoracic spine. There are no blastic or lytic bone lesions. Review of the MIP images confirms the above findings. IMPRESSION: No demonstrable pulmonary embolus. Right pleural effusion with right base airspace consolidation. Small left effusion with left base atelectatic change. No parenchymal lung nodular lesion seen by CT.  No adenopathy. Enlargement of the left lobe of the thyroid with coarse calcifications in the left lobe  of the thyroid. Reflux of contrast into the inferior vena cava may be indicative of increase in right heart pressure. Note that there is mild cardiomegaly with mild left ventricular hypertrophy. Pacemaker leads are attached to the right atrium and right ventricle. Electronically Signed   By: Lowella Grip III M.D.   On: 06/30/2015 14:34    2D Echo:  Study Conclusions  - Left ventricle: The cavity size was severely dilated. Wall  thickness was normal. Systolic function was severely reduced. The  estimated ejection fraction was 15%. Diffuse hypokinesis. Doppler  parameters are consistent with abnormal left ventricular  relaxation (grade 1 diastolic dysfunction). Doppler parameters  are consistent with high ventricular filling pressure. - Mitral valve: There was moderate regurgitation. - Left atrium: The atrium was severely dilated.  Impressions:  - Severe global reduction in LV function; severe LVE; grade 1  diastolic dysfunction with elevated LV filling pressure; severe  LAE; moderate  MR; mild TR.  Transthoracic echocardiography. M-mode, complete 2D, spectral Doppler, and color Doppler. Birthdate: Patient birthdate: 1950/05/11. Age: Patient is 65 yr old. Sex: Gender: female. BMI: 28.5 kg/m^2. Blood pressure: 114/44 Patient status: Inpatient. Study date: Study date: 07/01/2015. Study time: 10:54 AM. Location: Bedside.    Admission HPI: 65 year old woman with history of cardiomyopathy, chronic CHF with reduced EF (echo 09/12/2014 with EF 123456, grade 1 diastolic dysfunction) s/p AICD in 2012, mod to severe mitral regurgitation, meningioma s/p craniotomy in 2014, HLD, allergic rhinitis, OSA, hypothyroidism, HTN presenting with dyspnea and chest pressure x 2 days.  Developed a cough and sneezing on Tuesday. The following day she developed chest discomfort with sneezing and dyspnea. By Thursday, her dyspnea had worsened to being unbearable on Friday. +orthopnea. Presently can only walk a few feet before getting short of breath.   Chest pain on left side around area of ICD. Felt like weight on chest, pressure. No radiation. Worse with moving around. Resting makes it better. Intermittent at first but then became more constant last night. Pressure improving today.  Took Lasix this morning.   No lightheadedness or dizziness. +fatigue. No leg edema. Urinating without difficulty.  Bronchitis in March - took antibiotics for this. Resolved after this.  Hospital Course by problem list:  1. Congestive heart failure exacerbation: Patient presented with increased SOB, DOE, CP and increased orthopnea x 5 days. Found on CT chest to have small R pleural effusion and L atelectasis, consistent with bibasilar crackles observed on exam. Echocardiogram showed EF of 15% consistent with previous reports. Unclear underlying etiology, as workup for ACS, infection, medication non-adherence, thyroid, or metabolic derangement noncontributory. Her exam and symptoms improved with IV  diuresis and she was clinically stable for discharge on 07/02/15 with follow-up scheduled with her PCP the following day. She was discharged on PO 40mg  Lasix daily.  2. Hypertension: She was normotensive on presentation. Given the increased lasix dosage, her home lisinopril was decreased to 2.5mg  daily. She experienced no hypertension, and was borderline hypotensive to the 0000000 systolic, consistent with her past history. She was stable and ready for discharge on 07/02/15.   Discharge Vitals:   BP 88/57 mmHg  Pulse 86  Temp(Src) 97.5 F (36.4 C) (Oral)  Resp 18  Ht 5\' 9"  (1.753 m)  Wt 193 lb 1.6 oz (87.59 kg)  BMI 28.50 kg/m2  SpO2 100%  Discharge Labs:  Results for orders placed or performed during the hospital encounter of 06/30/15 (from the past 24 hour(s))  Basic metabolic panel     Status: Abnormal  Collection Time: 07/02/15  3:14 AM  Result Value Ref Range   Sodium 140 135 - 145 mmol/L   Potassium 3.9 3.5 - 5.1 mmol/L   Chloride 101 101 - 111 mmol/L   CO2 27 22 - 32 mmol/L   Glucose, Bld 115 (H) 65 - 99 mg/dL   BUN 19 6 - 20 mg/dL   Creatinine, Ser 0.79 0.44 - 1.00 mg/dL   Calcium 9.0 8.9 - 10.3 mg/dL   GFR calc non Af Amer >60 >60 mL/min   GFR calc Af Amer >60 >60 mL/min   Anion gap 12 5 - 15    Signed: Milagros Loll, MD 07/02/2015, 11:27 AM    Services Ordered on Discharge: None Equipment Ordered on Discharge: None

## 2015-07-03 ENCOUNTER — Ambulatory Visit: Admit: 2015-07-03 | Discharge: 2015-07-03 | Payer: MEDICARE | Attending: Internal Medicine | Primary: Family Medicine

## 2015-07-03 ENCOUNTER — Inpatient Hospital Stay: Admit: 2015-07-03 | Payer: MEDICARE | Primary: Family Medicine

## 2015-07-03 DIAGNOSIS — I5023 Acute on chronic systolic (congestive) heart failure: Secondary | ICD-10-CM

## 2015-07-03 LAB — METABOLIC PANEL, BASIC
Anion gap: 12 mmol/L (ref 3.0–18)
BUN/Creatinine ratio: 30 — ABNORMAL HIGH (ref 12–20)
BUN: 25 MG/DL — ABNORMAL HIGH (ref 7.0–18)
CO2: 27 mmol/L (ref 21–32)
Calcium: 9 MG/DL (ref 8.5–10.1)
Chloride: 104 mmol/L (ref 100–108)
Creatinine: 0.84 MG/DL (ref 0.6–1.3)
GFR est AA: >60 mL/min/{1.73_m2} (ref >60)
GFR est non-AA: >60 mL/min/{1.73_m2} (ref >60)
Glucose: 97 mg/dL (ref 74–99)
Potassium: 4.6 mmol/L (ref 3.5–5.5)
Sodium: 143 mmol/L (ref 136–145)

## 2015-07-03 NOTE — ACP (Advance Care Planning) (Signed)
Patient previously received information on ACP.

## 2015-07-03 NOTE — Progress Notes (Signed)
Cathy [name] is a 65 y.o.  female and presents with Hospital Follow Up [facility] ) and CHF      SUBJECTIVE:  Pt last week while at a conference in [facility] at a conference had a hot dog which has a lot of salt. She soom began to shortness of breath and chest pain over the next few days that would not resolve on its own. Pt eventually went to the ER where she was admitted for CHF. She was treated with IV Lasix and her EF% was 15 on 2D Echo. Pt admits she has not been exercising over the last few months as much as she did in the past. Her Lisinopril was decreased to 2.5 mg and Lasix was increased to 40 mg daily. Pt is feeling better overall.        Respiratory ROS: negative for - shortness of breath  Cardiovascular ROS: negative for - chest pain    Current Outpatient Prescriptions   Medication Sig   ??? CRESTOR 5 mg tablet TAKE ONE TABLET BY MOUTH EVERY EVENING   ??? calcium-vitamin D (CALCIUM 600 + D,3,) 600 mg(1,500mg ) -200 unit tab Take 1 Tab by mouth two (2) times daily (with meals).   ??? lisinopril (PRINIVIL, ZESTRIL) 5 mg tablet TAKE ONE TABLET BY MOUTH DAILY   ??? carvedilol (COREG) 3.125 mg tablet TAKE ONE TABLET BY MOUTH TWICE A DAY   ??? furosemide (LASIX) 20 mg tablet TAKE ONE TABLET BY MOUTH DAILY   ??? albuterol (PROVENTIL HFA, VENTOLIN HFA, PROAIR HFA) 90 mcg/actuation inhaler Take 2 Puffs by inhalation every four (4) hours as needed for Wheezing or Shortness of Breath. Indications: BRONCHOSPASM PREVENTION   ??? fluticasone (FLONASE) 50 mcg/actuation nasal spray 2 Sprays by Both Nostrils route daily. (Patient taking differently: 2 Sprays by Both Nostrils route daily. As needed)     No current facility-administered medications for this visit.          OBJECTIVE:  alert, well appearing, and in no distress  Visit Vitals   ??? BP 99/51 (BP 1 Location: Left arm, BP Patient Position: Sitting)   ??? Pulse (!) 56   ??? Temp 98.4 ??F (36.9 ??C) (Oral)   ??? Resp 18   ??? Ht 5\' 9"  (1.753 m)    ??? Wt 196 lb (88.9 kg)   ??? SpO2 97%   ??? BMI 28.94 kg/m2      well developed and well nourished  Chest - clear to auscultation, no wheezes, rales or rhonchi, symmetric air entry  Heart - normal rate, regular rhythm, normal S1, S2, no murmurs, rubs, clicks or gallops  Extremities - peripheral pulses normal, no pedal edema, no clubbing or cyanosis    Labs: check BMP       Assessment/Plan      ICD-10-CM ICD-9-CM    1. Acute on chronic systolic congestive heart failure (HCC) I50.23 428.23 Pt doing better on Lasix 40 mg and Lisinopril 2.5 mg. Will check METABOLIC PANEL, BASIC. Pt to f/u with cardiology and exercising on a more regular basis      428.0      Follow-up Disposition:  Return if symptoms worsen or fail to improve.    Reviewed plan of care. Patient has provided input and agrees with goals.

## 2015-07-03 NOTE — Progress Notes (Signed)
Patient is in the office today for a hospital follow up.  Patient states she went to the ED for coughing and sneezing and then developed SOB.     1. Have you been to the ER, urgent care clinic since your last visit?  Hospitalized since your last visit?yes, [facility] in [place].    2. Have you seen or consulted any other health care providers outside of the [place] since your last visit?  Include any pap smears or colon screening. No

## 2015-07-03 NOTE — Patient Instructions (Signed)
A Healthy Lifestyle: Care Instructions  Your Care Instructions  A healthy lifestyle can help you feel good, stay at a healthy weight, and have plenty of energy for both work and play. A healthy lifestyle is something you can share with your whole family.  A healthy lifestyle also can lower your risk for serious health problems, such as high blood pressure, heart disease, and diabetes.  You can follow a few steps listed below to improve your health and the health of your family.  Follow-up care is a key part of your treatment and safety. Be sure to make and go to all appointments, and call your doctor if you are having problems. It???s also a good idea to know your test results and keep a list of the medicines you take.  How can you care for yourself at home?  ?? Do not eat too much sugar, fat, or fast foods. You can still have dessert and treats now and then. The goal is moderation.  ?? Start small to improve your eating habits. Pay attention to portion sizes, drink less juice and soda pop, and eat more fruits and vegetables.  ?? Eat a healthy amount of food. A 3-ounce serving of meat, for example, is about the size of a deck of cards. Fill the rest of your plate with vegetables and whole grains.  ?? Limit the amount of soda and sports drinks you have every day. Drink more water when you are thirsty.  ?? Eat at least 5 servings of fruits and vegetables every day. It may seem like a lot, but it is not hard to reach this goal. A serving or helping is 1 piece of fruit, 1 cup of vegetables, or 2 cups of leafy, raw vegetables. Have an apple or some carrot sticks as an afternoon snack instead of a candy bar. Try to have fruits and/or vegetables at every meal.  ?? Make exercise part of your daily routine. You may want to start with simple activities, such as walking, bicycling, or slow swimming. Try to be active 30 to 60 minutes every day. You do not need to do all 30 to 60  minutes all at once. For example, you can exercise 3 times a day for 10 or 20 minutes. Moderate exercise is safe for most people, but it is always a good idea to talk to your doctor before starting an exercise program.  ?? Keep moving. Mow the lawn, work in the garden, or clean your house. Take the stairs instead of the elevator at work.  ?? If you smoke, quit. People who smoke have an increased risk for heart attack, stroke, cancer, and other lung illnesses. Quitting is hard, but there are ways to boost your chance of quitting tobacco for good.  ?? Use nicotine gum, patches, or lozenges.  ?? Ask your doctor about stop-smoking programs and medicines.  ?? Keep trying.  In addition to reducing your risk of diseases in the future, you will notice some benefits soon after you stop using tobacco. If you have shortness of breath or asthma symptoms, they will likely get better within a few weeks after you quit.  ?? Limit how much alcohol you drink. Moderate amounts of alcohol (up to 2 drinks a day for men, 1 drink a day for women) are okay. But drinking too much can lead to liver problems, high blood pressure, and other health problems.  Family health  If you have a family, there are many things you can do   together to improve your health.  ?? Eat meals together as a family as often as possible.  ?? Eat healthy foods. This includes fruits, vegetables, lean meats and dairy, and whole grains.  ?? Include your family in your fitness plan. Most people think of activities such as jogging or tennis as the way to fitness, but there are many ways you and your family can be more active. Anything that makes you breathe hard and gets your heart pumping is exercise. Here are some tips:  ?? Walk to do errands or to take your child to school or the bus.  ?? Go for a family bike ride after dinner instead of watching TV.  Where can you learn more?  Go to http://www.healthwise.net/[employer].   Enter U807 in the search box to learn more about "A Healthy Lifestyle: Care Instructions."  Current as of: September 19, 2014  Content Version: 11.2  ?? 2006-2017 [employer]. Care instructions adapted under license by [employer] (which disclaims liability or warranty for this information). If you have questions about a medical condition or this instruction, always ask your healthcare professional. Healthwise, Inc[employer] disclaims any warranty or liability for your use of this information.

## 2015-07-04 NOTE — Progress Notes (Signed)
Tell patient his/her blood work is good

## 2015-07-04 NOTE — Progress Notes (Signed)
Pt notified. Voiced understanding

## 2015-07-06 MED ORDER — DENOSUMAB 60 MG/ML SUB-Q SYRINGE
60 mg/mL | Freq: Once | SUBCUTANEOUS | Status: AC
Start: 2015-07-06 — End: 2015-07-10

## 2015-07-06 MED FILL — PROLIA 60 MG/ML SUBCUTANEOUS SYRINGE: 60 mg/mL | SUBCUTANEOUS | Qty: 1

## 2015-07-06 NOTE — Telephone Encounter (Signed)
Pt is scheduled with [facility]  Tuesday, Jul 10, 2015 @ 11:00am  [place], [place], [place]  Ph# [phone]    Order faxed for Prolia Injection to [phone],    Pt made aware of appt & states she is in [place] but will call to cancel & reschedule appt.

## 2015-07-06 NOTE — Telephone Encounter (Signed)
Order faxed.

## 2015-07-06 NOTE — Telephone Encounter (Addendum)
Cathy [name] with [facility] request corrected prescription.    Pt has appt for prolia injection 07/10/2015    She said prescription needs  "strength and route"    Request please correct and refax [phone]

## 2015-07-11 ENCOUNTER — Encounter: Attending: Specialist | Primary: Family Medicine

## 2015-07-11 ENCOUNTER — Encounter: Primary: Family Medicine

## 2015-07-11 ENCOUNTER — Encounter: Admit: 2015-07-11 | Discharge: 2015-07-11 | Payer: MEDICARE | Primary: Family Medicine

## 2015-07-11 MED ORDER — DENOSUMAB 60 MG/ML SUB-Q SYRINGE
60 mg/mL | Freq: Once | SUBCUTANEOUS | Status: AC
Start: 2015-07-11 — End: 2015-07-13
  Administered 2015-07-13: 16:00:00 via SUBCUTANEOUS

## 2015-07-11 MED FILL — PROLIA 60 MG/ML SUBCUTANEOUS SYRINGE: 60 mg/mL | SUBCUTANEOUS | Qty: 1

## 2015-07-13 ENCOUNTER — Inpatient Hospital Stay: Admit: 2015-07-13 | Payer: MEDICARE | Primary: Family Medicine

## 2015-07-13 DIAGNOSIS — M810 Age-related osteoporosis without current pathological fracture: Secondary | ICD-10-CM

## 2015-07-13 NOTE — Progress Notes (Signed)
[facility] Progress Note    Date: Jul 13, 2015    Name: Cathy [name]    MRN: [id number]         DOB: [birth year]    Prolia Injection    [name] to [place], ambulatory, at 1115. Pt was assessed and education was provided. Care notes were given to the patient.     [name]'s vitals were reviewed.  Visit Vitals   ??? BP (!) 88/60 (BP 1 Location: Left arm, BP Patient Position: Sitting)   ??? Pulse 70   ??? Temp 98.5 ??F (36.9 ??C)   ??? Resp 18   ??? SpO2 95%   ??? Breastfeeding No     Lab results from 07/03/2015 were reviewed. Calcium level noted to be 9.0.     Prolia 60 mg administered SQ in the back of the left arm. No redness or bleeding noted. Band-aid applied to the site.     Patient Vitals for the past 12 hrs:   Temp Pulse Resp BP SpO2   07/13/15 1210 98.5 ??F (36.9 ??C) 70 18 (!) 88/60 95 %   07/13/15 1120 98.1 ??F (36.7 ??C) 62 18 122/83 98 %     Pt remained for a 30 minuet observation peroid. No distress or reactions noted. BP rechecked and was noted to be 88/60. Pt states that she did take her Coreg and he Lasix this morning and that her BP is usually lower. Pt denied having any symptoms of dizziness or lightheadedness. Pt refused to stay while her referring MD was called and stated "I'm leaving."    Dr. Madelyn Brunner was paged and a message was left for the MD regarding the pt's low BP at 1235.     [name] tolerated the procedure, and had no complaints.     Patient armband removed and shredded.    [name] was discharged from [place] at 1230. She is to return on 01/11/2016 at 1100 for her next appointment.    Earney Mallet, RN  Jul 13, 2015

## 2015-07-13 NOTE — Telephone Encounter (Signed)
Noted  

## 2015-07-13 NOTE — Telephone Encounter (Signed)
Threasa Beards called to notify pt b/p 122/83 given inj after 30 rechecked b/p and noted 88/60.    Tried to get reading standing up was unable to get the reading and pt did not want to stay while melanie called doc to notify and so she left.    Pt states that she did take lasix and coreg today before the appt.

## 2015-07-18 ENCOUNTER — Encounter: Primary: Family Medicine

## 2015-07-19 ENCOUNTER — Encounter: Attending: Internal Medicine | Primary: Family Medicine

## 2015-07-26 ENCOUNTER — Encounter: Attending: Internal Medicine | Primary: Family Medicine

## 2015-08-02 ENCOUNTER — Ambulatory Visit: Admit: 2015-08-02 | Discharge: 2015-08-02 | Payer: MEDICARE | Attending: Specialist | Primary: Family Medicine

## 2015-08-02 DIAGNOSIS — I5022 Chronic systolic (congestive) heart failure: Secondary | ICD-10-CM

## 2015-08-02 MED ORDER — FUROSEMIDE 40 MG TAB
40 mg | ORAL_TABLET | Freq: Every day | ORAL | 6 refills | Status: DC
Start: 2015-08-02 — End: 2016-03-04

## 2015-08-02 NOTE — Progress Notes (Signed)
1. Have you been to the ER, urgent care clinic since your last visit?  Hospitalized since your last visit?     Yes Where: [facility]/Shortness of Breath    2. Have you seen or consulted any other health care providers outside of the [facility] since your last visit?  Include any pap smears or colon screening.      No     3.  Since your last visit, have you had any of the following symptoms?      chest pains, palpitations, shortness of breath and swelling in legs/arms.          4.  Have you had any blood work, X-rays or cardiac testing?      Yes Where: [facility]            5.  Where do you normally have your labs drawn?   [facility]/PCP    6. Do you need any refills today?   No

## 2015-08-02 NOTE — Progress Notes (Signed)
HISTORY OF PRESENT ILLNESS  Cathy [name] is a 65 y.o. female.    HPI Comments: Patient with chf,cmp,mr syncope  Happened while eating at the restaurant-icd eval-no significant arrythmia  No recurrence  Recent admission with chf-06/2015-feels better after discharge-no edema      Hospital Follow Up   The history is provided by the patient. Associated symptoms include shortness of breath. Pertinent negatives include no chest pain.   Cardiomyopathy   Associated symptoms include shortness of breath. Pertinent negatives include no chest pain.   Hypertension   Associated symptoms include shortness of breath. Pertinent negatives include no chest pain.   CHF   The history is provided by the patient. This is a chronic problem. The problem occurs constantly. The problem has not changed since onset.Associated symptoms include shortness of breath. Pertinent negatives include no chest pain. The symptoms are aggravated by exertion.   Shortness of Breath   The history is provided by the patient. This is a recurrent problem. The problem occurs frequently.The problem has not changed since onset.Pertinent negatives include no fever, no cough, no sputum production, no hemoptysis, no wheezing, no PND, no orthopnea, no chest pain, no vomiting, no rash, no leg swelling and no claudication.       Review of Systems   Constitutional: Negative for chills and fever.   HENT: Negative for nosebleeds.    Eyes: Negative for blurred vision and double vision.   Respiratory: Positive for shortness of breath. Negative for cough, hemoptysis, sputum production and wheezing.    Cardiovascular: Negative for chest pain, palpitations, orthopnea, claudication, leg swelling and PND.   Gastrointestinal: Negative for heartburn, nausea and vomiting.   Musculoskeletal: Negative for myalgias.   Skin: Negative for rash.   Neurological: Negative for dizziness, loss of consciousness and weakness.   Endo/Heme/Allergies: Does not bruise/bleed easily.      Family History   Problem Relation Age of Onset   ??? Diabetes Mother    ??? Heart Attack Father      MI   ??? Heart Disease Maternal Grandmother    ??? Heart Disease Paternal Grandmother    ??? Kidney Disease Other      1 sib deceased   ??? Cancer Other      1 sib throat cancer   ??? Diabetes Other    ??? Diabetes Daughter    ??? Other Daughter      leukemia   ??? Hypertension Other        Past Medical History:   Diagnosis Date   ??? Automatic implantable cardiac defibrillator in situ     Post ddd icd Set up carelink   ??? Breast mass, left     benign   ??? CHF (congestive heart failure) ([facility]) 06/23/2008    Non-ischemic CMP , Cath (2008) Normal coronary arteries   ??? Chronic systolic heart failure (HCC)     Stable,    ??? Degenerative arthritis of left knee    ??? Fibroid 06/23/2008   ??? HLD (hyperlipidemia)    ??? HTN (hypertension) 06/23/2008   ??? Hypotension, unspecified     Related to lisinopril   ??? 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 Left knee pain     With swelling   ??? Leg pain    ??? 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 OSA (obstructive sleep apnea) 9/10   ??? Venous reflux    ???  Wears glasses        Past Surgical History:   Procedure Laterality Date   ??? HX BREAST BIOPSY      rt breast, benign   ??? HX CHOLECYSTECTOMY     ??? HX CRANIOTOMY  2013    meningioma    ??? HX HEENT      glasses   ??? HX PACEMAKER      dual chamber icd   ??? HX TONSIL AND ADENOIDECTOMY     ??? HX TOTAL ABDOMINAL HYSTERECTOMY  1996   ??? HX TUBAL LIGATION         Allergies   Allergen Reactions   ??? Tramadol Other (comments)     Psychotic Hallucinations   ??? Penicillamine Itching   ??? Pcn [Penicillins] Itching     Itching bumps on hands and arms       Current Outpatient Prescriptions   Medication Sig   ??? furosemide (LASIX) 40 mg tablet Take 1 Tab by mouth daily.   ??? CRESTOR 5 mg tablet TAKE ONE TABLET BY MOUTH EVERY EVENING   ??? calcium-vitamin D (CALCIUM 600 + D,3,) 600 mg(1,500mg ) -200 unit tab  Take 1 Tab by mouth two (2) times daily (with meals).   ??? lisinopril (PRINIVIL, ZESTRIL) 5 mg tablet TAKE ONE TABLET BY MOUTH DAILY   ??? carvedilol (COREG) 3.125 mg tablet TAKE ONE TABLET BY MOUTH TWICE A DAY   ??? albuterol (PROVENTIL HFA, VENTOLIN HFA, PROAIR HFA) 90 mcg/actuation inhaler Take 2 Puffs by inhalation every four (4) hours as needed for Wheezing or Shortness of Breath. Indications: BRONCHOSPASM PREVENTION   ??? fluticasone (FLONASE) 50 mcg/actuation nasal spray 2 Sprays by Both Nostrils route daily. (Patient taking differently: 2 Sprays by Both Nostrils route daily. As needed)     No current facility-administered medications for this visit.        Visit Vitals   ??? BP 93/48   ??? Pulse 73   ??? Ht 5\' 9"  (1.753 m)   ??? Wt 88.5 kg (195 lb)   ??? BMI 28.8 kg/m2         Physical Exam   Constitutional: She is oriented to person, place, and time. She appears well-developed and well-nourished.   HENT:   Head: Normocephalic and atraumatic.   Eyes: Conjunctivae are normal.   Neck: Neck supple. No JVD present. No tracheal deviation present. No thyromegaly present.   Cardiovascular: Normal rate and regular rhythm.  PMI is not displaced.  Exam reveals no gallop, no S3 and no decreased pulses.    Murmur heard.   Holosystolic murmur is present with a grade of 2/6  at the lower left sternal border, apex  Pulmonary/Chest: No respiratory distress. She has no wheezes. She has no rales. She exhibits no tenderness.   Abdominal: Soft. There is no tenderness.   Musculoskeletal: She exhibits no edema.   Neurological: She is alert and oriented to person, place, and time.   Skin: Skin is warm.   Psychiatric: She has a normal mood and affect.     CARDIOLOGY STUDIES 08/25/2010 10/25/2008 07/25/2008 01/25/2008 02/24/2006   Myocardial Perfusion Scan Result - - FIXED INF AND REVERSIBLE ANT APICAL DEFECT - -   Cardiac Cath Result - - - - EF 25%, NO SIGNIFICANT CAD   Echocardiogram - Complete Result DILATED 20%, MILD MR - - EF 25% -    Doppler US Arterial Result - NL SCAN - - -  Cathy [name] has a reminder for a "due or due soon" health maintenance. I have asked that she contact her primary care provider for follow-up on this health maintenance.    SUMMARY:echo:01/2013  Left ventricle: The ventricle was moderately to severely dilated. Systolic  function was moderately to markedly reduced. Ejection fraction was  estimated in the range of 20 % to 25 %. There was severe diffuse  hypokinesis. Features were consistent with a pseudonormal left ventricular  filling pattern, with concomitant abnormal relaxation and increased  filling pressure (grade 2 diastolic dysfunction). Intracavitary echoes  most suggestive of apical trabeculations were present.    Left atrium: The atrium was severely dilated. LA volume index was 91  ml/m squared.    Right atrium: The atrium was mildly dilated.    Mitral valve: There was mild annular calcification. There was mild diffuse  thickening of the anterior and posterior leaflets. In certain images the  leaflet tips exhibit minimally reduced coaptation. There was moderate to  severe regurgitation. Mean transmitral gradient was 2.7 mmHg. The  effective orifice of mitral regurgitation by proximal isovelocity surface  area was 0.22 cm squared. The volume of mitral regurgitation by proximal  isovelocity surface area was 39 ml.    Tricuspid valve: There was mild regurgitation. Pulmonary artery systolic  pressure: 56 mmHg.   SUMMARY:echo-08/2014  Left ventricle: The ventricle was moderately dilated. Systolic function  was severely reduced. Ejection fraction was estimated to be 20 %. There  was severe diffuse hypokinesis. Doppler parameters were consistent with  abnormal left ventricular relaxation (grade 1 diastolic dysfunction).    Left atrium: The atrium was severely dilated.    Atrial septum: There was no evidence of intracardiac shunt by  peripherally-administered agitated saline contrast.     Right atrium: There was a possible, small, mobile mass associated with the  pacing wire.    Mitral valve: There was mild annular calcification. There was mild diffuse  thickening of the anterior and posterior leaflets. There was moderate  regurgitation. The effective orifice of mitral regurgitation by proximal  isovelocity surface area was 0.1 cm??????. The volume of mitral regurgitation  by proximal isovelocity surface area was 27 ml.    Tricuspid valve: There was mild regurgitation. Pulmonary artery systolic  pressure: 30 mmHg.    Study Conclusions-echo-06/2015    - Left ventricle: The cavity size was severely dilated. Wall  ????thickness was normal. Systolic function was severely reduced. The  ????estimated ejection fraction was 15%. Diffuse hypokinesis. Doppler  ????parameters are consistent with abnormal left ventricular  ????relaxation (grade 1 diastolic dysfunction). Doppler parameters  ????are consistent with high ventricular filling pressure.  - Mitral valve: There was moderate regurgitation.  - Left atrium: The atrium was severely dilated.    Impressions:    - Severe global reduction in LV function; severe LVE; grade 1  ????diastolic dysfunction with elevated LV filling pressure; severe  ????LAE; moderate MR; mild TR.    Assessment       ICD-10-CM ICD-9-CM    1. Chronic systolic congestive heart failure (HCC) I50.22 428.22      428.0     sable  better after recent admission   2. Essential hypertension I10 401.9     stable   3. Cardiomyopathy, unspecified type I42.9 425.4     ef 15%   4. Non-rheumatic mitral regurgitation I34.0 424.0     stable   5. AICD (automatic cardioverter/defibrillator) present Z95.810 V45.02     normal function     On  low dose coreg and lisinopril as Patient had hypotension in past with higher dose  Medications Discontinued During This Encounter   Medication Reason   ??? furosemide (LASIX) 20 mg tablet Reorder       Orders Placed This Encounter   ??? furosemide (LASIX) 40 mg tablet      Sig: Take 1 Tab by mouth daily.     Dispense:  30 Tab     Refill:  6       Follow-up Disposition:  Return in about 3 months (around 11/02/2015).

## 2015-09-03 ENCOUNTER — Telehealth

## 2015-09-03 NOTE — Telephone Encounter (Signed)
Pt called to request a order for a mammogram.

## 2015-09-03 NOTE — Telephone Encounter (Signed)
Left message for patient order was placed any questions call the office.

## 2015-09-03 NOTE — Telephone Encounter (Signed)
Order placed

## 2015-09-17 ENCOUNTER — Encounter

## 2015-09-17 ENCOUNTER — Inpatient Hospital Stay: Admit: 2015-09-17 | Payer: MEDICARE | Attending: Internal Medicine | Primary: Family Medicine

## 2015-09-17 DIAGNOSIS — Z1231 Encounter for screening mammogram for malignant neoplasm of breast: Secondary | ICD-10-CM

## 2015-10-02 ENCOUNTER — Encounter: Attending: Specialist | Primary: Family Medicine

## 2015-10-17 ENCOUNTER — Encounter: Primary: Family Medicine

## 2015-10-24 ENCOUNTER — Encounter: Primary: Family Medicine

## 2015-11-06 ENCOUNTER — Ambulatory Visit: Admit: 2015-11-06 | Discharge: 2015-11-06 | Payer: MEDICARE | Attending: Specialist | Primary: Family Medicine

## 2015-11-06 DIAGNOSIS — I5022 Chronic systolic (congestive) heart failure: Secondary | ICD-10-CM

## 2015-11-06 NOTE — Patient Instructions (Signed)
Care link dates scheduled and 1 year in office check.

## 2015-11-06 NOTE — Progress Notes (Signed)
1. Have you been to the ER, urgent care clinic since your last visit?  Hospitalized since your last visit?     No    2. Have you seen or consulted any other health care providers outside of the [facility] since your last visit?  Include any pap smears or colon screening.      Yes Where: PCP     3.  Since your last visit, have you had any of the following symptoms?      chest pains.         4.  Have you had any blood work, X-rays or cardiac testing?      Yes Where: PCP Office/Labs             5.  Where do you normally have your labs drawn?   PCP Office    6. Do you need any refills today?   No

## 2015-11-06 NOTE — Progress Notes (Signed)
HISTORY OF PRESENT ILLNESS  Cathy [name] is a 65 y.o. female.    HPI Comments: Patient with chf,cmp,mr syncope  Happened while eating at the restaurant-icd eval-no significant arrythmia  No recurrence        Cardiomyopathy   The history is provided by the patient. This is a chronic problem. The problem occurs constantly. The problem has not changed since onset.Associated symptoms include shortness of breath. Pertinent negatives include no chest pain, no abdominal pain and no headaches.   Hypertension   The history is provided by the patient. This is a chronic problem. The problem occurs constantly. Associated symptoms include shortness of breath. Pertinent negatives include no chest pain, no abdominal pain and no headaches.   CHF   The history is provided by the patient. This is a chronic problem. The problem occurs constantly. The problem has not changed since onset.Associated symptoms include shortness of breath. Pertinent negatives include no chest pain, no abdominal pain and no headaches. The symptoms are aggravated by exertion.   Shortness of Breath   The history is provided by the patient. This is a recurrent problem. The problem occurs frequently.The problem has not changed since onset.Pertinent negatives include no fever, no headaches, no cough, no sputum production, no hemoptysis, no wheezing, no PND, no orthopnea, no chest pain, no vomiting, no abdominal pain, no rash, no leg swelling and no claudication.       Review of Systems   Constitutional: Negative for chills and fever.   HENT: Negative for nosebleeds.    Eyes: Negative for blurred vision and double vision.   Respiratory: Positive for shortness of breath. Negative for cough, hemoptysis, sputum production and wheezing.    Cardiovascular: Negative for chest pain, palpitations, orthopnea, claudication, leg swelling and PND.   Gastrointestinal: Negative for abdominal pain, heartburn, nausea and vomiting.    Musculoskeletal: Negative for myalgias.   Skin: Negative for rash.   Neurological: Negative for dizziness, loss of consciousness, weakness and headaches.   Endo/Heme/Allergies: Does not bruise/bleed easily.     Family History   Problem Relation Age of Onset   ??? Diabetes Mother    ??? Heart Attack Father      MI   ??? Heart Disease Maternal Grandmother    ??? Heart Disease Paternal Grandmother    ??? Kidney Disease Other      1 sib deceased   ??? Cancer Other      1 sib throat cancer   ??? Diabetes Other    ??? Diabetes Daughter    ??? Other Daughter      leukemia   ??? Hypertension Other        Past Medical History:   Diagnosis Date   ??? Automatic implantable cardiac defibrillator in situ     Post ddd icd Set up carelink   ??? Breast mass, left     benign   ??? CHF (congestive heart failure) ([place]) 06/23/2008    Non-ischemic CMP , Cath (2008) Normal coronary arteries   ??? Chronic systolic heart failure (HCC)     Stable,    ??? Degenerative arthritis of left knee    ??? Fibroid 06/23/2008   ??? HLD (hyperlipidemia)    ??? HTN (hypertension) 06/23/2008   ??? Hypotension, unspecified     Related to lisinopril   ??? 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 Left knee pain     With swelling   ??? Leg pain    ???  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 OSA (obstructive sleep apnea) 9/10   ??? Venous reflux    ??? Wears glasses        Past Surgical History:   Procedure Laterality Date   ??? HX BREAST BIOPSY      rt breast, benign   ??? HX CHOLECYSTECTOMY     ??? HX CRANIOTOMY  2013    meningioma    ??? HX HEENT      glasses   ??? HX PACEMAKER      dual chamber icd   ??? HX TONSIL AND ADENOIDECTOMY     ??? HX TOTAL ABDOMINAL HYSTERECTOMY  1996   ??? HX TUBAL LIGATION         Allergies   Allergen Reactions   ??? Tramadol Other (comments)     Psychotic Hallucinations   ??? Penicillamine Itching   ??? Pcn [Penicillins] Itching     Itching bumps on hands and arms       Current Outpatient Prescriptions   Medication Sig    ??? furosemide (LASIX) 40 mg tablet Take 1 Tab by mouth daily.   ??? CRESTOR 5 mg tablet TAKE ONE TABLET BY MOUTH EVERY EVENING   ??? calcium-vitamin D (CALCIUM 600 + D,3,) 600 mg(1,500mg ) -200 unit tab Take 1 Tab by mouth two (2) times daily (with meals).   ??? lisinopril (PRINIVIL, ZESTRIL) 5 mg tablet TAKE ONE TABLET BY MOUTH DAILY   ??? carvedilol (COREG) 3.125 mg tablet TAKE ONE TABLET BY MOUTH TWICE A DAY   ??? albuterol (PROVENTIL HFA, VENTOLIN HFA, PROAIR HFA) 90 mcg/actuation inhaler Take 2 Puffs by inhalation every four (4) hours as needed for Wheezing or Shortness of Breath. Indications: BRONCHOSPASM PREVENTION   ??? fluticasone (FLONASE) 50 mcg/actuation nasal spray 2 Sprays by Both Nostrils route daily. (Patient taking differently: 2 Sprays by Both Nostrils route daily. As needed)     No current facility-administered medications for this visit.        Visit Vitals   ??? BP 93/49   ??? Pulse 75   ??? Ht 5\' 9"  (1.753 m)   ??? Wt 88.5 kg (195 lb)   ??? BMI 28.8 kg/m2         Physical Exam   Constitutional: She is oriented to person, place, and time. She appears well-developed and well-nourished.   HENT:   Head: Normocephalic and atraumatic.   Eyes: Conjunctivae are normal.   Neck: Neck supple. No JVD present. No tracheal deviation present. No thyromegaly present.   Cardiovascular: Normal rate and regular rhythm.  PMI is not displaced.  Exam reveals no gallop, no S3 and no decreased pulses.    Murmur heard.   Holosystolic murmur is present with a grade of 2/6  at the lower left sternal border, apex  Pulmonary/Chest: No respiratory distress. She has no wheezes. She has no rales. She exhibits no tenderness.   Abdominal: Soft. There is no tenderness.   Musculoskeletal: She exhibits no edema.   Neurological: She is alert and oriented to person, place, and time.   Skin: Skin is warm.   Psychiatric: She has a normal mood and affect.     CARDIOLOGY STUDIES 08/25/2010 10/25/2008 07/25/2008 01/25/2008 02/24/2006    Myocardial Perfusion Scan Result - - FIXED INF AND REVERSIBLE ANT APICAL DEFECT - -   Cardiac Cath Result - - - - EF 25%, NO SIGNIFICANT CAD   Echocardiogram - Complete Result DILATED 20%,  MILD MR - - EF 25% -   Doppler US Arterial Result - NL SCAN - - -   Some recent data might be hidden           Cathy [name] has a reminder for a "due or due soon" health maintenance. I have asked that she contact her primary care provider for follow-up on this health maintenance.    SUMMARY:echo:01/2013  Left ventricle: The ventricle was moderately to severely dilated. Systolic  function was moderately to markedly reduced. Ejection fraction was  estimated in the range of 20 % to 25 %. There was severe diffuse  hypokinesis. Features were consistent with a pseudonormal left ventricular  filling pattern, with concomitant abnormal relaxation and increased  filling pressure (grade 2 diastolic dysfunction). Intracavitary echoes  most suggestive of apical trabeculations were present.    Left atrium: The atrium was severely dilated. LA volume index was 91  ml/m squared.    Right atrium: The atrium was mildly dilated.    Mitral valve: There was mild annular calcification. There was mild diffuse  thickening of the anterior and posterior leaflets. In certain images the  leaflet tips exhibit minimally reduced coaptation. There was moderate to  severe regurgitation. Mean transmitral gradient was 2.7 mmHg. The  effective orifice of mitral regurgitation by proximal isovelocity surface  area was 0.22 cm squared. The volume of mitral regurgitation by proximal  isovelocity surface area was 39 ml.    Tricuspid valve: There was mild regurgitation. Pulmonary artery systolic  pressure: 56 mmHg.   SUMMARY:echo-08/2014  Left ventricle: The ventricle was moderately dilated. Systolic function  was severely reduced. Ejection fraction was estimated to be 20 %. There  was severe diffuse hypokinesis. Doppler parameters were consistent with   abnormal left ventricular relaxation (grade 1 diastolic dysfunction).    Left atrium: The atrium was severely dilated.    Atrial septum: There was no evidence of intracardiac shunt by  peripherally-administered agitated saline contrast.    Right atrium: There was a possible, small, mobile mass associated with the  pacing wire.    Mitral valve: There was mild annular calcification. There was mild diffuse  thickening of the anterior and posterior leaflets. There was moderate  regurgitation. The effective orifice of mitral regurgitation by proximal  isovelocity surface area was 0.1 cm??????. The volume of mitral regurgitation  by proximal isovelocity surface area was 27 ml.    Tricuspid valve: There was mild regurgitation. Pulmonary artery systolic  pressure: 30 mmHg.    Study Conclusions-echo-06/2015    - Left ventricle: The cavity size was severely dilated. Wall  ????thickness was normal. Systolic function was severely reduced. The  ????estimated ejection fraction was 15%. Diffuse hypokinesis. Doppler  ????parameters are consistent with abnormal left ventricular  ????relaxation (grade 1 diastolic dysfunction). Doppler parameters  ????are consistent with high ventricular filling pressure.  - Mitral valve: There was moderate regurgitation.  - Left atrium: The atrium was severely dilated.    Impressions:    - Severe global reduction in LV function; severe LVE; grade 1  ????diastolic dysfunction with elevated LV filling pressure; severe  ????LAE; moderate MR; mild TR.    Assessment       ICD-10-CM ICD-9-CM    1. Chronic systolic congestive heart failure (HCC) I50.22 428.22      428.0     stable  nyha class 3  atypical chest pains   2. Cardiomyopathy, unspecified type ([place]) I42.9 425.4     stable   3. Essential hypertension  I10 401.9     low normal   4. High cholesterol E78.00 272.0     stable   5. AICD (automatic cardioverter/defibrillator) present Z95.810 V45.02     needs carelink   6. Non-rheumatic mitral regurgitation I34.0 424.0      stable     On low dose coreg and lisinopril as Patient had hypotension in past with higher dose  Does not want lvad/transplant evaluation  There are no discontinued medications.    No orders of the defined types were placed in this encounter.      Follow-up Disposition:  Return in about 6 months (around 05/05/2016) for carelink setup/transmission.

## 2015-11-07 MED ORDER — CARVEDILOL 3.125 MG TAB
3.125 mg | ORAL_TABLET | ORAL | 4 refills | Status: DC
Start: 2015-11-07 — End: 2016-03-13

## 2015-11-07 MED ORDER — LISINOPRIL 5 MG TAB
5 mg | ORAL_TABLET | ORAL | 4 refills | Status: DC
Start: 2015-11-07 — End: 2016-03-13

## 2015-12-14 MED ORDER — CRESTOR 5 MG TABLET
5 mg | ORAL_TABLET | ORAL | 3 refills | Status: DC
Start: 2015-12-14 — End: 2016-07-16

## 2016-01-10 MED ORDER — DENOSUMAB 60 MG/ML SUB-Q SYRINGE
60 mg/mL | Freq: Once | SUBCUTANEOUS | Status: AC
Start: 2016-01-10 — End: 2016-01-11
  Administered 2016-01-11: 17:00:00 via SUBCUTANEOUS

## 2016-01-10 MED FILL — PROLIA 60 MG/ML SUBCUTANEOUS SYRINGE: 60 mg/mL | SUBCUTANEOUS | Qty: 1

## 2016-01-11 ENCOUNTER — Inpatient Hospital Stay: Admit: 2016-01-11 | Payer: MEDICARE | Primary: Family Medicine

## 2016-01-11 DIAGNOSIS — M810 Age-related osteoporosis without current pathological fracture: Secondary | ICD-10-CM

## 2016-01-11 LAB — POC CHEM8
Anion gap, POC: 17 (ref 10–20)
BUN, POC: 10 MG/DL (ref 7–18)
CO2, POC: 26 MMOL/L — ABNORMAL HIGH (ref 19–24)
Calcium, ionized (POC): 1.14 MMOL/L (ref 1.12–1.32)
Chloride, POC: 105 MMOL/L (ref 100–108)
Creatinine, POC: 0.6 MG/DL (ref 0.6–1.3)
GFRAA, POC: >60 mL/min/{1.73_m2} (ref >60)
GFRNA, POC: >60 mL/min/{1.73_m2} (ref >60)
Glucose, POC: 87 MG/DL (ref 74–106)
Hematocrit, POC: 36 % (ref 36–49)
Hemoglobin, POC: 12.2 G/DL (ref 12–16)
Potassium, POC: 4.4 MMOL/L (ref 3.5–5.5)
Sodium, POC: 142 MMOL/L (ref 136–145)

## 2016-01-11 LAB — MAGNESIUM: Magnesium: 2 mg/dL (ref 1.6–2.6)

## 2016-01-11 LAB — PHOSPHORUS: Phosphorus: 3.1 MG/DL (ref 2.5–4.9)

## 2016-01-11 NOTE — Progress Notes (Signed)
[facility] Progress Note    Date: January 11, 2016    Name: Cathy [name]    MRN: [id number]         DOB: May 05, 1950    Prolia Injection    [name] to [place], ambulatory, at 1110. Pt was assessed and education was provided. Patient states that she did well with her last injection and had no issues.    [name]'s vitals were reviewed.  Visit Vitals   ??? BP 119/76 (BP 1 Location: Left arm, BP Patient Position: Sitting)   ??? Pulse 85   ??? Temp 98.7 ??F (37.1 ??C)   ??? Resp 18   ??? SpO2 99%   ??? Breastfeeding No     Blood drawn via right AC x1 attempt for IStat BMP, MAG ang Phos level per written order.     Lab results obtained and reviewed.  Recent Results (from the past 12 hour(s))   POC CHEM8    Collection Time: 01/11/16 11:28 AM   Result Value Ref Range    CO2, POC 26 (H) 19 - 24 MMOL/L    Glucose, POC 87 74 - 106 MG/DL    BUN, POC 10 7 - 18 MG/DL    Creatinine, POC 0.6 0.6 - 1.3 MG/DL    GFRAA, POC >60 >60 ml/min/1.23m2    GFRNA, POC >60 >60 ml/min/1.61m2    Sodium, POC 142 136 - 145 MMOL/L    Potassium, POC 4.4 3.5 - 5.5 MMOL/L    Calcium, ionized (POC) 1.14 1.12 - 1.32 MMOL/L    Chloride, POC 105 100 - 108 MMOL/L    Anion gap, POC 17 10 - 20      Hematocrit, POC 36 36 - 49 %    Hemoglobin, POC 12.2 12 - 16 G/DL     Ionized Calcium= 1.14 Lab results are within ordered parameters to administer the Prolia injection today.     Verified with the patient that she is taking her Calcium medication as prescribed.       Prolia 60 mg administered SQ in the back of the left arm. No redness or bleeding noted. Band-aid applied to the site.       [name] tolerated the procedure, and had no complaints.       Patient armband removed and shredded.      [name] was discharged from [facility] at 1140. She is to return on 07/11/2016 at 1100 for her next appointment.    Earney Mallet, RN  January 11, 2016

## 2016-01-23 ENCOUNTER — Encounter: Admit: 2016-01-23 | Discharge: 2016-01-23 | Payer: MEDICARE | Primary: Family Medicine

## 2016-02-06 ENCOUNTER — Institutional Professional Consult (permissible substitution): Admit: 2016-02-06 | Discharge: 2016-02-06 | Payer: MEDICARE | Attending: Physician Assistant | Primary: Family Medicine

## 2016-02-06 DIAGNOSIS — Z23 Encounter for immunization: Secondary | ICD-10-CM

## 2016-02-06 NOTE — Progress Notes (Signed)
Verbal order for FLU HIGH DOSE 0.61ml  received per Tommye Standard PA-C for pt Cathy [name]. Order written down and read back to provider for verification prior to completion.     Patient received High Dose Flu vaccine 0.6ml in left deltoid. Tolerated well. No signs or symptoms of distress noted. Most current VIS given and consent signed.she was observed for 10 min post injection. There were no reactions observed.

## 2016-02-12 ENCOUNTER — Ambulatory Visit: Admit: 2016-02-12 | Discharge: 2016-02-12 | Payer: MEDICARE | Attending: Internal Medicine | Primary: Family Medicine

## 2016-02-12 DIAGNOSIS — J019 Acute sinusitis, unspecified: Secondary | ICD-10-CM

## 2016-02-12 MED ORDER — HYDROCODONE 10 MG-CHLORPHENIRAMINE 8 MG/5 ML ORAL SUSP EXTEND.REL 12HR
10-8 mg/5 mL | Freq: Two times a day (BID) | ORAL | 0 refills | Status: DC | PRN
Start: 2016-02-12 — End: 2016-03-20

## 2016-02-12 MED ORDER — AZITHROMYCIN 250 MG TAB
250 mg | ORAL_TABLET | ORAL | 0 refills | Status: AC
Start: 2016-02-12 — End: 2016-02-17

## 2016-02-12 NOTE — Patient Instructions (Signed)
A Healthy Lifestyle: Care Instructions  Your Care Instructions    A healthy lifestyle can help you feel good, stay at a healthy weight, and have plenty of energy for both work and play. A healthy lifestyle is something you can share with your whole family.  A healthy lifestyle also can lower your risk for serious health problems, such as high blood pressure, heart disease, and diabetes.  You can follow a few steps listed below to improve your health and the health of your family.  Follow-up care is a key part of your treatment and safety. Be sure to make and go to all appointments, and call your doctor if you are having problems. It's also a good idea to know your test results and keep a list of the medicines you take.  How can you care for yourself at home?  ?? Do not eat too much sugar, fat, or fast foods. You can still have dessert and treats now and then. The goal is moderation.  ?? Start small to improve your eating habits. Pay attention to portion sizes, drink less juice and soda pop, and eat more fruits and vegetables.  ?? Eat a healthy amount of food. A 3-ounce serving of meat, for example, is about the size of a deck of cards. Fill the rest of your plate with vegetables and whole grains.  ?? Limit the amount of soda and sports drinks you have every day. Drink more water when you are thirsty.  ?? Eat at least 5 servings of fruits and vegetables every day. It may seem like a lot, but it is not hard to reach this goal. A serving or helping is 1 piece of fruit, 1 cup of vegetables, or 2 cups of leafy, raw vegetables. Have an apple or some carrot sticks as an afternoon snack instead of a candy bar. Try to have fruits and/or vegetables at every meal.  ?? Make exercise part of your daily routine. You may want to start with simple activities, such as walking, bicycling, or slow swimming. Try to be active 30 to 60 minutes every day. You do not need to do all 30 to 60  minutes all at once. For example, you can exercise 3 times a day for 10 or 20 minutes. Moderate exercise is safe for most people, but it is always a good idea to talk to your doctor before starting an exercise program.  ?? Keep moving. Mow the lawn, work in the garden, or clean your house. Take the stairs instead of the elevator at work.  ?? If you smoke, quit. People who smoke have an increased risk for heart attack, stroke, cancer, and other lung illnesses. Quitting is hard, but there are ways to boost your chance of quitting tobacco for good.  ?? Use nicotine gum, patches, or lozenges.  ?? Ask your doctor about stop-smoking programs and medicines.  ?? Keep trying.  In addition to reducing your risk of diseases in the future, you will notice some benefits soon after you stop using tobacco. If you have shortness of breath or asthma symptoms, they will likely get better within a few weeks after you quit.  ?? Limit how much alcohol you drink. Moderate amounts of alcohol (up to 2 drinks a day for men, 1 drink a day for women) are okay. But drinking too much can lead to liver problems, high blood pressure, and other health problems.  Family health  If you have a family, there are many things you   can do together to improve your health.  ?? Eat meals together as a family as often as possible.  ?? Eat healthy foods. This includes fruits, vegetables, lean meats and dairy, and whole grains.  ?? Include your family in your fitness plan. Most people think of activities such as jogging or tennis as the way to fitness, but there are many ways you and your family can be more active. Anything that makes you breathe hard and gets your heart pumping is exercise. Here are some tips:  ?? Walk to do errands or to take your child to school or the bus.  ?? Go for a family bike ride after dinner instead of watching TV.  Where can you learn more?  Go to http://www.healthwise.net/[employer].   Enter U807 in the search box to learn more about "A Healthy Lifestyle: Care Instructions."  Current as of: Jul 06, 2015  Content Version: 11.4  ?? 2006-2017 [employer]. Care instructions adapted under license by [employer] (which disclaims liability or warranty for this information). If you have questions about a medical condition or this instruction, always ask your healthcare professional. Healthwise, Inc[employer] disclaims any warranty or liability for your use of this information.

## 2016-02-12 NOTE — Progress Notes (Signed)
Pt is here for a cough x 2 days.    1. Have you been to the ER, urgent care clinic since your last visit?  Hospitalized since your last visit?No    2. Have you seen or consulted any other health care providers outside of the [facility] since your last visit?  Include any pap smears or colon screening. No

## 2016-02-12 NOTE — Progress Notes (Signed)
HISTORY OF PRESENT ILLNESS  Cathy [name] is a 65 y.o. female.  Cold Symptoms   The history is provided by the patient. This is a new problem. The current episode started more than 2 days ago. The problem occurs constantly. The problem has not changed since onset.The cough is productive of brown sputum. Associated symptoms include rhinorrhea and wheezing. She has tried nothing for the symptoms. The treatment provided no relief.       Review of Systems   HENT: Positive for rhinorrhea.    Respiratory: Positive for wheezing.        Physical Exam   Constitutional: She appears well-developed and well-nourished.   HENT:   Right Ear: Tympanic membrane and ear canal normal.   Left Ear: Tympanic membrane and ear canal normal.   Nose: Mucosal edema present.   Mouth/Throat: Uvula is midline, oropharynx is clear and moist and mucous membranes are normal.   Cardiovascular: Normal rate, regular rhythm and normal heart sounds.    Vitals reviewed.      ASSESSMENT and PLAN    ICD-10-CM ICD-9-CM    1. Acute non-recurrent sinusitis, unspecified location J01.90 461.9 azithromycin (ZITHROMAX) 250 mg tablet   2. Cough R05 786.2 chlorpheniramine-HYDROcodone (TUSSIONEX) 10-8 mg/5 mL suspension     Reviewed plan of care. Patient has provided input and agrees with goals.

## 2016-02-22 MED ORDER — DOXYCYCLINE 100 MG TAB
100 mg | ORAL_TABLET | Freq: Two times a day (BID) | ORAL | 0 refills | Status: AC
Start: 2016-02-22 — End: 2016-03-03

## 2016-02-22 NOTE — Telephone Encounter (Signed)
Another antibiotic sent in

## 2016-02-22 NOTE — Telephone Encounter (Signed)
Left message for patient medication was sent to [facility].

## 2016-02-22 NOTE — Telephone Encounter (Signed)
Pt states she received antibiotics her last appointment for mucus and chest congestion she has finished the antibiotics but the symptoms are still there pt wants another refill .

## 2016-03-04 ENCOUNTER — Encounter: Attending: Internal Medicine | Primary: Family Medicine

## 2016-03-04 ENCOUNTER — Ambulatory Visit: Admit: 2016-03-04 | Discharge: 2016-03-04 | Payer: MEDICARE | Attending: Family Medicine | Primary: Family Medicine

## 2016-03-04 ENCOUNTER — Inpatient Hospital Stay: Admit: 2016-03-04 | Payer: MEDICARE | Primary: Family Medicine

## 2016-03-04 DIAGNOSIS — I5022 Chronic systolic (congestive) heart failure: Secondary | ICD-10-CM

## 2016-03-04 DIAGNOSIS — Z1159 Encounter for screening for other viral diseases: Secondary | ICD-10-CM

## 2016-03-04 MED ORDER — FUROSEMIDE 20 MG TAB
20 mg | ORAL_TABLET | Freq: Every day | ORAL | 1 refills | Status: DC
Start: 2016-03-04 — End: 2016-03-10

## 2016-03-04 NOTE — Patient Instructions (Addendum)
Chief Complaint   Patient presents with   ??? Establish Care   ??? Fatigue   ??? Cough     coughing up Beige Color to the phlegm   ??? Nausea     just today      Patient was given a copy of the Advanced Medical Directive form and understands to bring it in once completed.       It is important for you to monitor your weight daily. Your daily weight will serve as an early indicator of fluid retention and therefore will increase before you have other symptoms of heart failure. This is one of the most important measures you can take, because it tells [place] if you are retaining excess water, and if the "water pill" is working or you need a dosage adjustment. Write down your daily weight in a journal or calendar so you can monitor weight gains or losses and have a record for your doctor visits.    CONGESTIVE HEART FAILURE INSTRUCTIONS:  1. Weight yourself at the same time with the same scale every day.    2. Weight yourself wearing similar clothing every day    3. Know your "DRY" weight. This is your "usual" weight that does not change from day to day.    4. Document/write down your weight with time and date in a journal, notebook, or calendar    5. Call your doctor if you gain 5 pounds within a week or 3 pounds in a day. Your physician may need to adjust your medications and avoid an unnecessary trip to the emergency room    6. Call your doctor if: Breathing becomes more difficult, you notice you're getting tired faster, you start coughing at night, you have dizzy spells or you faint, you begin urinating less frequently, you have tightness or pain in your chest, you think you're having side effects from your medication, or your feet or ankles swell up more than usual.      Heart Failure: Care Instructions  Your Care Instructions    Heart failure occurs when your heart does not pump as much blood as the body needs. Failure does not mean that the heart has stopped pumping but  rather that it is not pumping as well as it should. Over time, this causes fluid buildup in your lungs and other parts of your body. Fluid buildup can cause shortness of breath, fatigue, swollen ankles, and other problems. By taking medicines regularly, reducing sodium (salt) in your diet, checking your weight every day, and making lifestyle changes, you can feel better and live longer.  Follow-up care is a key part of your treatment and safety. Be sure to make and go to all appointments, and call your doctor if you are having problems. It's also a good idea to know your test results and keep a list of the medicines you take.  How can you care for yourself at home?  Medicines  ? ?? Be safe with medicines. Take your medicines exactly as prescribed. Call your doctor if you think you are having a problem with your medicine.   ? ?? Do not take any vitamins, over-the-counter medicine, or herbal products without talking to your doctor first. Do not take ibuprofen (Advil or Motrin) and naproxen (Aleve) without talking to your doctor first. They could make your heart failure worse.   ? ?? You may be taking some of the following medicine.  ?? Beta-blockers can slow heart rate, decrease blood pressure, and improve  your condition. Taking a beta-blocker may lower your chance of needing to be hospitalized.  ?? Angiotensin-converting enzyme inhibitors (ACEIs) reduce the heart's workload, lower blood pressure, and reduce swelling. Taking an ACEI may lower your chance of needing to be hospitalized again.  ?? Angiotensin II receptor blockers (ARBs) work like ACEIs. Your doctor may prescribe them instead of ACEIs.  ?? Diuretics, also called water pills, reduce swelling.  ?? Potassium supplements replace this important mineral, which is sometimes lost with diuretics.  ?? Aspirin and other blood thinners prevent blood clots, which can cause a stroke or heart attack.   ?You will get more details on the specific medicines your doctor prescribes.   Diet  ? ?? Your doctor may suggest that you limit sodium to 2,000 milligrams (mg) a day or less. That is less than 1 teaspoon of salt a day, including all the salt you eat in cooking or in packaged foods. People get most of their sodium from processed foods. Fast food and restaurant meals also tend to be very high in sodium.   ? ?? Ask your doctor how much liquid you can drink each day. You may have to limit liquids.   ?Weight  ? ?? Weigh yourself without clothing at the same time each day. Record your weight. Call your doctor if you have a sudden weight gain, such as more than 2 to 3 pounds in a day or 5 pounds in a week. (Your doctor may suggest a different range of weight gain.) A sudden weight gain may mean that your heart failure is getting worse.   ?Activity level  ? ?? Start light exercise (if your doctor says it is okay). Even if you can only do a small amount, exercise will help you get stronger, have more energy, and manage your weight and your stress. Walking is an easy way to get exercise. Start out by walking a little more than you did before. Bit by bit, increase the amount you walk.   ? ?? When you exercise, watch for signs that your heart is working too hard. You are pushing yourself too hard if you cannot talk while you are exercising. If you become short of breath or dizzy or have chest pain, stop, sit down, and rest.   ? ?? If you feel "wiped out" the day after you exercise, walk slower or for a shorter distance until you can work up to a better pace.   ? ?? Get enough rest at night. Sleeping with 1 or 2 pillows under your upper body and head may help you breathe easier.   ?Lifestyle changes  ? ?? Do not smoke. Smoking can make a heart condition worse. If you need help quitting, talk to your doctor about stop-smoking programs and medicines. These can increase your chances of quitting for good. Quitting smoking may be the most important step you can take to protect your heart.    ? ?? Limit alcohol to 2 drinks a day for men and 1 drink a day for women. Too much alcohol can cause health problems.   ? ?? Avoid getting sick from colds and the flu. Get a pneumococcal vaccine shot. If you have had one before, ask your doctor whether you need another dose. Get a flu shot each year. If you must be around people with colds or the flu, wash your hands often.   When should you call for help?  Call 911 if you have symptoms of sudden heart failure such  as:  ? ?? You have severe trouble breathing.   ? ?? You cough up pink, foamy mucus.   ? ?? You have a new irregular or rapid heartbeat.   ?Call your doctor now or seek immediate medical care if:  ? ?? You have new or increased shortness of breath.   ? ?? You are dizzy or lightheaded, or you feel like you may faint.   ? ?? You have sudden weight gain, such as more than 2 to 3 pounds in a day or 5 pounds in a week. (Your doctor may suggest a different range of weight gain.)   ? ?? You have increased swelling in your legs, ankles, or feet.   ? ?? You are suddenly so tired or weak that you cannot do your usual activities.   ?Watch closely for changes in your health, and be sure to contact your doctor if you develop new symptoms.  Where can you learn more?  Go to [email or web address].  Enter [phone] in the search box to learn more about "Heart Failure: Care Instructions."  Current as of: November 15, 2014  Content Version: 11.4  ?? 2006-2017 [employer]. Care instructions adapted under license by [employer] (which disclaims liability or warranty for this information). If you have questions about a medical condition or this instruction, always ask your healthcare professional. [place] any warranty or liability for your use of this information.

## 2016-03-04 NOTE — Progress Notes (Signed)
[facility]:  03/04/2016, MRN: [id number]      Cathy [name] is a 66 y.o. female and presents to clinic to Establish Care; Fatigue; Cough (coughing up Beige Color to the phlegm); and Nausea (just today )    Subjective:   The patient is a 66 year old female with history of hypertension, chronic systolic heart failure, mitral regurgitation, cholelithiasis, multinodular goiter, obstructive sleep apnea, allergic rhinitis, osteoporosis, uterine fibroids per EHR, insomnia, cataracts, AICD placement, hyperlipidemia, and meningioma status post resection.    1.  Chronic systolic heart failure: This is a chronic condition, present for years.  Her EF was as low as 10-50% per her history.  With AICD placement, her EF improved to 20%.  Her last echocardiogram per review of the EHR is from 2016.  The patient's blood pressure today is 96/55.  She is on low-dose carvedilol and lisinopril.  She also takes 40 mg daily of Lasix.  She has fatigue and dyspnea on exertion.  She occasionally will have chest pain.  The symptoms resolve without any treatment.  She is thought to have nonischemic congestive heart failure per review of the EHR.  TFTs have been checked in the past, and are unremarkable when last checked.  She is followed by the cardiology team (Dr.Patel). No edema. She does not regularly check her weight.     09/12/14 Echocardiogram: Left ventricle: The ventricle was moderately dilated. Systolic function was severely reduced. Ejection fraction was estimated to be 20 %. There was severe diffuse hypokinesis. Doppler parameters were consistent with abnormal left ventricular relaxation (grade 1 diastolic dysfunction). Left atrium: The atrium was severely dilated. Atrial septum: There was no evidence of intracardiac shunt by peripherally-administered agitated saline contrast. Right atrium: There was a possible, small, mobile mass associated with the pacing wire. Mitral  valve: There was mild annular calcification. There was mild diffuse thickening of the anterior and posterior leaflets. There was moderate regurgitation. The effective orifice of mitral regurgitation by proximal 2.  History of multinodular goiter: The patient was followed by the ENT team isovelocity surface area was 0.1 cm??????. The volume of mitral regurgitation by proximal isovelocity surface area was 27 ml. Tricuspid valve: There was mild regurgitation. Pulmonary artery systolic pressure: 30 mmHg.    2.  Multinodular goiter: This is a chronic condition, present for several years.  Her last ultrasound was from 2010.  Findings are listed below.  She has no dysphagia.  No odynophagia.  Her most recent TFTs were unremarkable.  The patient was followed by the ENT team in the past.  She has not seen the ENT team for at least a year.    03/10/08 Thyroid ultrasound: Numerous scans of the thyroid were obtained. ??The right  lobe measures 4.4 x 1.4 x 1.7 cm in diameter. ??The left lobe  measures 5.4 x 2.3 x 2.6 cm in diameter. ??The isthmus appears unremarkable. ??There is a solid nodule with some calcification in  the left lobe measuring 2.1 x 1.6 x 2.1 cm and a complex nodule in  the lower pole of the left lobe measuring 1.8 x 1.7 x 1.8 cm.    3.  OSA: The patient had a sleep study that was positive for OSA in the past.  Symptoms resolved per her history.  She does not use CPAP as result.        Patient Active Problem List    Diagnosis Date Noted   ??? Age-related osteoporosis without current pathological fracture 03/04/2016   ??? Multinodular  goiter 03/04/2016   ??? History of meningioma 03/04/2016   ??? Non-rheumatic mitral regurgitation 04/25/2014   ??? AR (allergic rhinitis) 09/29/2011   ??? Chronic systolic heart failure ([place])    ??? Automatic implantable cardioverter-defibrillator in situ    ??? Mixed hyperlipidemia    ??? AICD (automatic cardioverter/defibrillator) present 11/15/2010   ??? Cataract 12/01/2008   ??? Gallstones 11/13/2008    ??? Insomnia 11/13/2008   ??? OSA (obstructive sleep apnea) 11/13/2008   ??? Fibroid 06/23/2008   ??? HTN (hypertension) 06/23/2008       Current Outpatient Prescriptions   Medication Sig Dispense Refill   ??? furosemide (LASIX) 20 mg tablet Take 1 Tab by mouth daily. 90 Tab 1   ??? chlorpheniramine-HYDROcodone (TUSSIONEX) 10-8 mg/5 mL suspension Take 5 mL by mouth every twelve (12) hours as needed for Cough. Max Daily Amount: 10 mL. 120 mL 0   ??? CRESTOR 5 mg tablet TAKE ONE TABLET BY MOUTH EVERY EVENING 90 Tab 3   ??? lisinopril (PRINIVIL, ZESTRIL) 5 mg tablet TAKE ONE TABLET BY MOUTH DAILY 30 Tab 4   ??? carvedilol (COREG) 3.125 mg tablet TAKE ONE TABLET BY MOUTH TWICE A DAY 60 Tab 4   ??? calcium-vitamin D (CALCIUM 600 + D,3,) 600 mg(1,549m) -200 unit tab Take 1 Tab by mouth two (2) times daily (with meals).     ??? albuterol (PROVENTIL HFA, VENTOLIN HFA, PROAIR HFA) 90 mcg/actuation inhaler Take 2 Puffs by inhalation every four (4) hours as needed for Wheezing or Shortness of Breath. Indications: BRONCHOSPASM PREVENTION 1 Inhaler 0   ??? fluticasone (FLONASE) 50 mcg/actuation nasal spray 2 Sprays by Both Nostrils route daily. (Patient taking differently: 2 Sprays by Both Nostrils route daily. As needed) 1 Bottle 5       Allergies   Allergen Reactions   ??? Tramadol Other (comments)     Psychotic Hallucinations   ??? Penicillamine Itching   ??? Pcn [Penicillins] Itching     Itching bumps on hands and arms       Past Medical History:   Diagnosis Date   ??? Automatic implantable cardiac defibrillator in situ     Post ddd icd Set up carelink   ??? Breast mass, left     benign   ??? [place] ([place]estive heart failure) [place] 06/23/2008    Non-ischemic CMP , Cath (2008) Normal coronary arteries   ??? Chronic systolic heart failure ([facility])     Stable,    ??? Degenerative arthritis of left knee    ??? Fibroid 06/23/2008   ??? HLD (hyperlipidemia)    ??? HTN (hypertension) 06/23/2008   ??? Hypotension, unspecified     Related to lisinopril    ??? 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 Left knee pain     With swelling   ??? Leg pain    ??? 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 OSA (obstructive sleep apnea) 9/10   ??? Venous reflux    ??? Wears glasses        Past Surgical History:   Procedure Laterality Date   ??? HX BREAST BIOPSY      rt breast, benign   ??? HX CHOLECYSTECTOMY     ??? HX CRANIOTOMY  2013    meningioma    ??? HX HEENT      glasses   ??? HX PACEMAKER      dual chamber icd   ???  HX TONSIL AND ADENOIDECTOMY     ??? HX TOTAL ABDOMINAL HYSTERECTOMY  1996   ??? HX TUBAL LIGATION         Family History   Problem Relation Age of Onset   ??? Diabetes Mother    ??? Heart Attack Father      MI   ??? Heart Disease Maternal Grandmother    ??? Heart Disease Paternal Grandmother    ??? Kidney Disease Other      1 sib deceased   ??? Cancer Other      1 sib throat cancer   ??? Diabetes Other    ??? Diabetes Daughter    ??? Other Daughter      leukemia   ??? Hypertension Other        Social History   Substance Use Topics   ??? Smoking status: Never Smoker   ??? Smokeless tobacco: Never Used   ??? Alcohol use No       ROS   Review of Systems   Constitutional: Negative for chills and fever.   HENT: Negative for ear pain and sore throat.    Eyes: Negative for blurred vision and pain.   Respiratory: Positive for shortness of breath.    Cardiovascular: Positive for chest pain (off/on but she is asymptomatic today).   Gastrointestinal: Negative for abdominal pain, blood in stool and melena.   Genitourinary: Negative for dysuria and hematuria.   Musculoskeletal: Negative for joint pain and myalgias.   Skin: Negative for rash.   Neurological: Positive for tingling (RUE). Negative for focal weakness and headaches.   Endo/Heme/Allergies: Does not bruise/bleed easily.   Psychiatric/Behavioral: Negative for substance abuse.       Objective     Vitals:    03/04/16 1229 03/04/16 1238   BP: (!) 90/38 96/55   Pulse: 85 88   Resp: 20     Temp: 97.5 ??F (36.4 ??C)    TempSrc: Oral    SpO2: 95% 95%   Weight: 200 lb 12.8 oz (91.1 kg)    Height: 5' 9"  (1.753 m)    PainSc:   4    PainLoc: Rib Cage        Physical Exam   Constitutional: She is oriented to person, place, and time and well-developed, well-nourished, and in no distress.   HENT:   Head: Normocephalic and atraumatic.   Right Ear: External ear normal.   Left Ear: External ear normal.   Nose: Nose normal.   Mouth/Throat: Oropharynx is clear and moist. No oropharyngeal exudate.   Clear Tms   Eyes: Conjunctivae and EOM are normal. Pupils are equal, round, and reactive to light. Right eye exhibits no discharge. Left eye exhibits no discharge. No scleral icterus.   Neck: Neck supple. Thyromegaly present.   Cardiovascular: Normal rate, regular rhythm and intact distal pulses.  Exam reveals no gallop and no friction rub.    Murmur ((soft 1/6 early systolic murmur along her lsb)) heard.  Pulmonary/Chest: Effort normal and breath sounds normal. No respiratory distress. She has no wheezes. She has no rales.   Abdominal: Soft. Bowel sounds are normal. She exhibits no distension. There is no tenderness. There is no rebound and no guarding.   No hepatosplenomegaly   Musculoskeletal: She exhibits no edema or tenderness (BUE).   Lymphadenopathy:     She has no cervical adenopathy.   Neurological: She is alert and oriented to person, place, and time. She exhibits normal muscle tone. Gait normal.   4/5  BUE grip strength   Skin: Skin is warm and dry. No erythema.   Psychiatric: Affect normal.   Nursing note and vitals reviewed.      LABS   Data Review:   Lab Results   Component Value Date/Time    WBC 5.7 03/04/2016 01:29 PM    Hemoglobin, POC 12.2 01/11/2016 11:28 AM    HGB 12.3 03/04/2016 01:29 PM    Hematocrit, POC 36 01/11/2016 11:28 AM    HCT 37.9 03/04/2016 01:29 PM    PLATELET 247 03/04/2016 01:29 PM    MCV 95.2 03/04/2016 01:29 PM       Lab Results   Component Value Date/Time     Sodium 142 03/04/2016 01:29 PM    Potassium 4.1 03/04/2016 01:29 PM    Chloride 105 03/04/2016 01:29 PM    CO2 31 03/04/2016 01:29 PM    Anion gap 6 03/04/2016 01:29 PM    Glucose 89 03/04/2016 01:29 PM    BUN 11 03/04/2016 01:29 PM    Creatinine 0.72 03/04/2016 01:29 PM    BUN/Creatinine ratio 15 03/04/2016 01:29 PM    GFR est AA >60 03/04/2016 01:29 PM    GFR est non-AA >60 03/04/2016 01:29 PM    Calcium 8.4 03/04/2016 01:29 PM       Lab Results   Component Value Date/Time    Cholesterol, total 161 04/09/2015 08:11 AM    HDL Cholesterol 73 04/09/2015 08:11 AM    LDL, calculated 79.8 04/09/2015 08:11 AM    VLDL, calculated 8.2 04/09/2015 08:11 AM    Triglyceride 41 04/09/2015 08:11 AM    CHOL/HDL Ratio 2.2 04/09/2015 08:11 AM       Lab Results   Component Value Date/Time    Hemoglobin A1c 5.8 12/09/2012 10:50 AM       Assessment/Plan:   1. Multinodular goiter:   ???Checking a CBC and TFTs.  ???I will also check a thyroid ultrasound.  If she has no new findings, no additional studies are warranted at this time.  If she has new thyroid nodules or enlargement of previously noted thyroid nodules, I will refer her back to ENT.    ORDERS:  - US THYROID/PARATHYROID/SOFT TISS; Future  - TSH AND FREE T4; Future  - CBC WITH AUTOMATED DIFF; Future    2. Chronic systolic congestive heart failure: Stable. Low BP. No edema on exam. +HTN.   ???Continue follow-up with the cardiology team.  ???I am decreasing her Lasix from 40 mg daily to 20 mg daily.  I will recheck her blood pressure in 2-3 weeks.  The patient was instructed to check her weight on a daily basis.  If she gains 3 pounds in 1 day or 5 pounds in 1 week, the patient was instructed to notify us immediately and just restart taking 40 mg daily of Lasix.  ???Checking a CMP, TFTs, a CBC, and a urine protein screen.  ???I would like to increase her beta-blocker and ACE inhibitor if possible???pending her follow-up blood pressures.  She is able to tolerate a  lower dose of Lasix, we may try to take her off of this completely will prescribe it only on an as-needed basis in the event that she gained 3 pounds in the day or 5 pounds in a week.  Given her hypotension on present medications today, I do not think she will tolerate Entresto.  I did look into her insurance prior approval requirements.  The patient would have to have symptomatic heart failure [place] Heart Association activity class II-IV, systolic dysfunction with EF less than 40%, no history of angioedema, documented 4 weeks of an ACE inhibitor, not using Tekturna, she would have to be prescribed this rx by her cardiology team, there should be no liver impairment, and the patient will need to be started on the 24???26 mg Entresto dose if her eGFR<30.  Although she would satisfy these requirements, I am not confident that she would tolerate Entresto 2/2 hypotension. This medication would also need be used in combination with a beta-blocker as tolerated.    ORDERS:  - furosemide (LASIX) 20 mg tablet; Take 1 Tab by mouth daily.  Dispense: 90 Tab; Refill: 1  - METABOLIC PANEL, COMPREHENSIVE; Future  - TSH AND FREE T4; Future  - CBC WITH AUTOMATED DIFF; Future  - MICROALBUMIN, UR, RAND W/ MICROALBUMIN/CREA RATIO; Future    3. Health Maintenance:  ???I am screening for hepatitis C per CDC recommendations based on her date of birth.  ???I will need to address at some point whether or not she is interested in osteoporosis treatment.    ORDERS:  - HEPATITIS C AB; Future      Health Maintenance Due   Topic Date Due   ??? Hepatitis C Screening  11/21/1950   ??? DTaP/Tdap/Td series (1 - Tdap) 04/12/1971   ??? ZOSTER VACCINE AGE 57>  02/08/2010   ??? GLAUCOMA SCREENING Q2Y  04/12/2015     Lab review: labs are reviewed in the EHR and ordered as mentioned above. I reviewed all imaging studies in our EHR    I have discussed the diagnosis with the patient and the intended plan as  seen in the above orders.  The patient has received an after-visit summary and questions were answered concerning future plans.  I have discussed medication side effects and warnings with the patient as well. I have reviewed the plan of care with the patient, accepted their input and they are in agreement with the treatment goals. All questions were answered. The patient understands the plan of care. Handouts provided today with above information. Pt instructed if symptoms worsen to call the office or report to the ED for continued care.  Greater than 50% of the visit time was spent in counseling and/or coordination of care.        Follow-up Disposition:  Return in about 2 weeks (around 03/18/2016) for BP check.    Leo Rod, MD

## 2016-03-04 NOTE — Progress Notes (Signed)
Chief Complaint   Patient presents with   ??? Establish Care   ??? Fatigue   ??? Cough     coughing up Beige Color to the phlegm   ??? Nausea     just today      Patient was given a copy of the Advanced Medical Directive form and understands to bring it in once completed.  1. Have you been to the ER, urgent care clinic since your last visit?  Hospitalized since your last visit?No    2. Have you seen or consulted any other health care providers outside of the [facility] since your last visit?  Include any pap smears or colon screening. No

## 2016-03-04 NOTE — ACP (Advance Care Planning) (Signed)
Patient was given a copy of the Advanced Medical Directive form and understands to bring it in once completed.

## 2016-03-05 LAB — CBC WITH AUTOMATED DIFF
ABS. BASOPHILS: 0 10*3/uL (ref 0.0–0.06)
ABS. EOSINOPHILS: 0 10*3/uL (ref 0.0–0.4)
ABS. LYMPHOCYTES: 1.4 10*3/uL (ref 0.9–3.6)
ABS. MONOCYTES: 0.4 10*3/uL (ref 0.05–1.2)
ABS. NEUTROPHILS: 3.8 10*3/uL (ref 1.8–8.0)
BASOPHILS: 0 % (ref 0–2)
EOSINOPHILS: 0 % (ref 0–5)
HCT: 37.9 % (ref 35.0–45.0)
HGB: 12.3 g/dL (ref 12.0–16.0)
LYMPHOCYTES: 24 % (ref 21–52)
MCH: 30.9 PG (ref 24.0–34.0)
MCHC: 32.5 g/dL (ref 31.0–37.0)
MCV: 95.2 FL (ref 74.0–97.0)
MONOCYTES: 7 % (ref 3–10)
MPV: 12.2 FL — ABNORMAL HIGH (ref 9.2–11.8)
NEUTROPHILS: 69 % (ref 40–73)
PLATELET: 247 10*3/uL (ref 135–420)
RBC: 3.98 M/uL — ABNORMAL LOW (ref 4.20–5.30)
RDW: 13.2 % (ref 11.6–14.5)
WBC: 5.7 10*3/uL (ref 4.6–13.2)

## 2016-03-05 LAB — METABOLIC PANEL, COMPREHENSIVE
A-G Ratio: 1.3 (ref 0.8–1.7)
ALT (SGPT): 23 U/L (ref 13–56)
AST (SGOT): 11 U/L — ABNORMAL LOW (ref 15–37)
Albumin: 4.1 g/dL (ref 3.4–5.0)
Alk. phosphatase: 77 U/L (ref 45–117)
Anion gap: 6 mmol/L (ref 3.0–18)
BUN/Creatinine ratio: 15 (ref 12–20)
BUN: 11 MG/DL (ref 7.0–18)
Bilirubin, total: 0.6 MG/DL (ref 0.2–1.0)
CO2: 31 mmol/L (ref 21–32)
Calcium: 8.4 MG/DL — ABNORMAL LOW (ref 8.5–10.1)
Chloride: 105 mmol/L (ref 100–108)
Creatinine: 0.72 MG/DL (ref 0.6–1.3)
GFR est AA: >60 mL/min/{1.73_m2} (ref >60)
GFR est non-AA: >60 mL/min/{1.73_m2} (ref >60)
Globulin: 3.1 g/dL (ref 2.0–4.0)
Glucose: 89 mg/dL (ref 74–99)
Potassium: 4.1 mmol/L (ref 3.5–5.5)
Protein, total: 7.2 g/dL (ref 6.4–8.2)
Sodium: 142 mmol/L (ref 136–145)

## 2016-03-05 LAB — HEPATITIS C AB
Hep C virus Ab Interp.: NEGATIVE
Hepatitis C virus Ab: <0.02 Index (ref <0.80)

## 2016-03-05 LAB — TSH AND FREE T4
T4, Free: 1.2 NG/DL (ref 0.7–1.5)
TSH: 1.85 u[IU]/mL (ref 0.36–3.74)

## 2016-03-05 LAB — MICROALBUMIN, UR, RAND W/ MICROALB/CREAT RATIO
Creatinine, urine random: 137.29 mg/dL — ABNORMAL HIGH (ref 30–125)
Microalbumin,urine random: 8.3 MG/DL — ABNORMAL HIGH (ref 0–3.0)
Microalbumin/Creat ratio (mg/g creat): 60 mg/g — ABNORMAL HIGH (ref 0–30)

## 2016-03-05 LAB — HEPATITIS C ANTIBODY
HCV Ab: <0.02 Index (ref <0.80)
Hepatitis C Ab: NEGATIVE

## 2016-03-07 ENCOUNTER — Inpatient Hospital Stay: Admit: 2016-03-07 | Payer: MEDICARE | Attending: Family Medicine | Primary: Family Medicine

## 2016-03-07 DIAGNOSIS — E042 Nontoxic multinodular goiter: Secondary | ICD-10-CM

## 2016-03-07 NOTE — Progress Notes (Signed)
Please let the pt know that her labs show no evidence of hepatitis C infection. Her kidney and liver function are normal. Her urine study shows evidence of reversible kidney injury (very mild). She should continue all of her medications, especially her blood pressure medication. 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 We will monitor her kidney function over time and repeat this test in 1 year. Meanwhile, there is no evidence of permanent kidney disease.  Her blood counts are normal. Her calcium is mildly low. She should continue taking calcium supplementation over the counter.    Dr. Marius Ditch  [facility], [place] [address]  Phone: [phone]  Fax: [phone]

## 2016-03-10 ENCOUNTER — Telehealth

## 2016-03-10 ENCOUNTER — Encounter

## 2016-03-10 MED ORDER — FUROSEMIDE 20 MG TAB
20 mg | ORAL_TABLET | Freq: Every day | ORAL | 3 refills | Status: DC
Start: 2016-03-10 — End: 2016-11-12

## 2016-03-10 NOTE — Telephone Encounter (Signed)
Called and spoke to patient about the below message.  Patient verbalized understanding with no additional questions.

## 2016-03-10 NOTE — Telephone Encounter (Signed)
-----   Message from Leo Rod, MD sent at 03/07/2016  4:25 PM EST -----  Please let the pt know that her labs show no evidence of hepatitis C infection. Her kidney and liver function are normal. Her urine study shows evidence of reversible kidney injury (very mild). She should continue all of her medications, especially her blood pressure medication. 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 We will monitor her kidney function over time and repeat this test in 1 year. Meanwhile, there is no evidence of permanent kidney disease.  Her blood counts are normal. Her calcium is mildly low. She should continue taking calcium supplementation over the counter.    Dr. Marius Ditch  [facility], [place] [address]  Phone: [phone]  Fax: [phone]

## 2016-03-10 NOTE — Telephone Encounter (Signed)
Follow up is scheduled for  May 05, 2016

## 2016-03-10 NOTE — Telephone Encounter (Signed)
Referral to ENT placed. We discussed her thyroid ultrasound results. We discussed the need for a biopsy. I instructed the pt to let me know if she is not contacted by the ENT office in a wk to schedule her f/u apt.    Dr. Marius Ditch  [facility], [place] [address]  Phone: [phone]  Fax: [phone]

## 2016-03-13 MED ORDER — CARVEDILOL 3.125 MG TAB
3.125 mg | ORAL_TABLET | Freq: Two times a day (BID) | ORAL | 1 refills | Status: DC
Start: 2016-03-13 — End: 2016-09-28

## 2016-03-13 MED ORDER — LISINOPRIL 5 MG TAB
5 mg | ORAL_TABLET | Freq: Two times a day (BID) | ORAL | 1 refills | Status: DC
Start: 2016-03-13 — End: 2016-04-25

## 2016-03-18 ENCOUNTER — Encounter: Attending: Family Medicine | Primary: Family Medicine

## 2016-03-20 ENCOUNTER — Ambulatory Visit: Admit: 2016-03-20 | Discharge: 2016-03-20 | Payer: MEDICARE | Attending: Family Medicine | Primary: Family Medicine

## 2016-03-20 DIAGNOSIS — I5022 Chronic systolic (congestive) heart failure: Secondary | ICD-10-CM

## 2016-03-20 NOTE — Patient Instructions (Addendum)
Heart Failure: Care Instructions  Your Care Instructions    Heart failure occurs when your heart does not pump as much blood as the body needs. Failure does not mean that the heart has stopped pumping but rather that it is not pumping as well as it should. Over time, this causes fluid buildup in your lungs and other parts of your body. Fluid buildup can cause shortness of breath, fatigue, swollen ankles, and other problems. By taking medicines regularly, reducing sodium (salt) in your diet, checking your weight every day, and making lifestyle changes, you can feel better and live longer.  Follow-up care is a key part of your treatment and safety. Be sure to make and go to all appointments, and call your doctor if you are having problems. It's also a good idea to know your test results and keep a list of the medicines you take.  How can you care for yourself at home?  Medicines  ? ?? Be safe with medicines. Take your medicines exactly as prescribed. Call your doctor if you think you are having a problem with your medicine.   ? ?? Do not take any vitamins, over-the-counter medicine, or herbal products without talking to your doctor first. Do not take ibuprofen (Advil or Motrin) and naproxen (Aleve) without talking to your doctor first. They could make your heart failure worse.   ? ?? You may be taking some of the following medicine.  ?? Beta-blockers can slow heart rate, decrease blood pressure, and improve your condition. Taking a beta-blocker may lower your chance of needing to be hospitalized.  ?? Angiotensin-converting enzyme inhibitors (ACEIs) reduce the heart's workload, lower blood pressure, and reduce swelling. Taking an ACEI may lower your chance of needing to be hospitalized again.  ?? Angiotensin II receptor blockers (ARBs) work like ACEIs. Your doctor may prescribe them instead of ACEIs.  ?? Diuretics, also called water pills, reduce swelling.   ?? Potassium supplements replace this important mineral, which is sometimes lost with diuretics.  ?? Aspirin and other blood thinners prevent blood clots, which can cause a stroke or heart attack.   ?You will get more details on the specific medicines your doctor prescribes.  Diet  ? ?? Your doctor may suggest that you limit sodium to 2,000 milligrams (mg) a day or less. That is less than 1 teaspoon of salt a day, including all the salt you eat in cooking or in packaged foods. People get most of their sodium from processed foods. Fast food and restaurant meals also tend to be very high in sodium.   ? ?? Ask your doctor how much liquid you can drink each day. You may have to limit liquids.   ?Weight  ? ?? Weigh yourself without clothing at the same time each day. Record your weight. Call your doctor if you have a sudden weight gain, such as more than 2 to 3 pounds in a day or 5 pounds in a week. (Your doctor may suggest a different range of weight gain.) A sudden weight gain may mean that your heart failure is getting worse.   ?Activity level  ? ?? Start light exercise (if your doctor says it is okay). Even if you can only do a small amount, exercise will help you get stronger, have more energy, and manage your weight and your stress. Walking is an easy way to get exercise. Start out by walking a little more than you did before. Bit by bit, increase the amount you walk.   ? ??   When you exercise, watch for signs that your heart is working too hard. You are pushing yourself too hard if you cannot talk while you are exercising. If you become short of breath or dizzy or have chest pain, stop, sit down, and rest.   ? ?? If you feel "wiped out" the day after you exercise, walk slower or for a shorter distance until you can work up to a better pace.   ? ?? Get enough rest at night. Sleeping with 1 or 2 pillows under your upper body and head may help you breathe easier.   ?Lifestyle changes   ? ?? Do not smoke. Smoking can make a heart condition worse. If you need help quitting, talk to your doctor about stop-smoking programs and medicines. These can increase your chances of quitting for good. Quitting smoking may be the most important step you can take to protect your heart.   ? ?? Limit alcohol to 2 drinks a day for men and 1 drink a day for women. Too much alcohol can cause health problems.   ? ?? Avoid getting sick from colds and the flu. Get a pneumococcal vaccine shot. If you have had one before, ask your doctor whether you need another dose. Get a flu shot each year. If you must be around people with colds or the flu, wash your hands often.   When should you call for help?  Call 911 if you have symptoms of sudden heart failure such as:  ? ?? You have severe trouble breathing.   ? ?? You cough up pink, foamy mucus.   ? ?? You have a new irregular or rapid heartbeat.   ?Call your doctor now or seek immediate medical care if:  ? ?? You have new or increased shortness of breath.   ? ?? You are dizzy or lightheaded, or you feel like you may faint.   ? ?? You have sudden weight gain, such as more than 2 to 3 pounds in a day or 5 pounds in a week. (Your doctor may suggest a different range of weight gain.)   ? ?? You have increased swelling in your legs, ankles, or feet.   ? ?? You are suddenly so tired or weak that you cannot do your usual activities.   ?Watch closely for changes in your health, and be sure to contact your doctor if you develop new symptoms.  Where can you learn more?  Go to [employer].  Enter [phone] in the search box to learn more about "Heart Failure: Care Instructions."  Current as of: November 15, 2014  Content Version: 11.4  ?? 2006-2017 [employer]. Care instructions adapted under license by [employer] (which disclaims liability or warranty for this information). If you have questions about a medical condition or  this instruction, always ask your healthcare professional. [place] any warranty or liability for your use of this information.       Deciding About Bisphosphonate Medicine for Osteoporosis  Deciding About Bisphosphonate Medicine for Osteoporosis  What is osteoporosis?  Osteoporosis is a disease that affects your bones. It means you have bones that are thin and brittle and have lots of holes inside them like a sponge. This makes them easy to break. It also increases your risk for spine and hip fractures. These fractures may make it hard for you to live on your own.  Bisphosphonates are the most common medicines used to prevent bone loss. Most of the time, you  take them as pills. They slow the way bone dissolves and is absorbed by your body. They can increase bone thickness and strength.  What are key points about this decision?  ?? If you have osteoporosis, these medicines can increase bone thickness. And they can lower your risk of spine and hip fractures. You may also want to think about taking them if you have osteopenia or risk factors for osteoporosis.  ?? These medicines can have side effects, such as heartburn and belly pain. They also can cause headaches. Their long-term effects are not known.  ?? Whether you take medicine or not, healthy habits can also help protect your bones. Talk to your doctor about taking calcium and vitamin D supplements. Get regular weight-bearing exercise. Cut back on alcohol. And if you smoke, quit.  Why might you choose to take bisphosphonate medicines?  ?? You have bone loss that is not normal for your age.  ?? You have osteoporosis or osteopenia. Or you have risk factors for osteoporosis.  ?? You have been taking hormone therapy (HT) and stopped. Bone loss medicines can protect against rapid bone loss after you quit HT.  ?? You do not mind taking pills. Or you do not mind getting medicines through a tube in your vein, called an IV.   ?? You think the benefits of taking these medicines outweigh the side effects.  Why might you choose not to take these medicines?  ?? You want to try other medicines that help prevent bone loss. These include calcitonin (Calcimar or Miacalcin), denosumab (Prolia or Xgeva), hormone therapy, raloxifene (Evista), and teriparatide (Forteo).  ?? You want to try healthy habits to prevent bone fractures.  ?? You do not like the idea of taking pills. Or you do not like getting medicines through a tube in your vein, called an IV.  ?? You are worried about the side effects of these medicines.  Your decision  Thinking about the facts and your feelings can help you make a decision that is right for you. Be sure you understand the benefits and risks of your options, and think about what else you need to do before you make the decision.  Where can you learn more?  Go to [employer].  Enter [phone] in the search box to learn more about "Deciding About Bisphosphonate Medicine for Osteoporosis."  Current as of: Jul 06, 2015  Content Version: 11.4  ?? 2006-2017 [employer]. Care instructions adapted under license by [employer] (which disclaims liability or warranty for this information). If you have questions about a medical condition or this instruction, always ask your healthcare professional. [place] any warranty or liability for your use of this information.

## 2016-03-20 NOTE — Progress Notes (Signed)
[facility]:  03/20/2016, MRN: [id number]      Cathy [name] is a 66 y.o. female and presents to clinic for Blood Pressure Check and Nausea (feeling week)    Subjective:   The patient is a 66 year old female with history of hypertension, chronic systolic heart failure, mitral regurgitation, cholelithiasis, multinodular goiter, obstructive sleep apnea, allergic rhinitis, osteoporosis, uterine fibroids per EHR, insomnia, cataracts, AICD placement, hyperlipidemia, and meningioma status post resection.    1. Thyroid nodule: The patient has  a several year history of thyroid nodules: Her most recent thyroid ultrasound earlier this month shows suspicious left thyroid lobe nodules.  She met with the ENT team earlier this week on Tuesday.  They are planning to do a biopsy of the suspicious nodules.  Meanwhile, her TSH is unremarkable from earlier this month.    Lab Results   Component Value Date/Time    TSH 1.85 03/04/2016 01:29 PM     03/07/16 Thyroid Ultrasound: Suspicious nodules in the left thyroid lobe for which FNA sampling is recommended for histologic diagnosis.    2. CHF (Systolic): This is a chronic condition, present for several years.  She is followed closely by the cardiology team and is scheduled to see them later this year.  She had an AICD placed and her last EF was noted to be 20% per patient history.  Her last echocardiogram per review of the EHR is from 2016.  Her blood pressure at her last appointment was 96/55.  She had complaint of dizziness, nausea, and occasional bouts of chest pain.  She was taking carvedilol and lisinopril but at low doses.  She was on 40 mg of Lasix at that time.  I decreased her Lasix to 20 mg daily to see if her blood pressure would improve and with that, her dizziness and nausea.  She feels less fatigue since then.  No edema, shortness of breath, or weight gain of 3 pounds in 24 hours since her last appointment.  Her  pressure today is still low but is 100/59???slightly better than her last appointment.    3.  Osteoporosis: Present for over a year.  She received 2 injections for Prolia.  Her last injection was November of last year.      Patient Active Problem List    Diagnosis Date Noted   ??? Age-related osteoporosis without current pathological fracture 03/04/2016   ??? Multinodular goiter 03/04/2016   ??? History of meningioma 03/04/2016   ??? Non-rheumatic mitral regurgitation 04/25/2014   ??? AR (allergic rhinitis) 09/29/2011   ??? Chronic systolic heart failure ([place])    ??? Automatic implantable cardioverter-defibrillator in situ    ??? Mixed hyperlipidemia    ??? AICD (automatic cardioverter/defibrillator) present 11/15/2010   ??? Cataract 12/01/2008   ??? Gallstones 11/13/2008   ??? Insomnia 11/13/2008   ??? OSA (obstructive sleep apnea) 11/13/2008   ??? Fibroid 06/23/2008   ??? HTN (hypertension) 06/23/2008       Current Outpatient Prescriptions   Medication Sig Dispense Refill   ??? lisinopril (PRINIVIL, ZESTRIL) 5 mg tablet Take 1 Tab by mouth two (2) times a day. 180 Tab 1   ??? carvedilol (COREG) 3.125 mg tablet Take 1 Tab by mouth two (2) times a day. 180 Tab 1   ??? furosemide (LASIX) 20 mg tablet Take 1 Tab by mouth daily. 90 Tab 3   ??? CRESTOR 5 mg tablet TAKE ONE TABLET BY MOUTH EVERY EVENING 90 Tab 3   ??? calcium-vitamin D (CALCIUM  600 + D,3,) 600 mg(1,557m) -200 unit tab Take 1 Tab by mouth two (2) times daily (with meals).     ??? albuterol (PROVENTIL HFA, VENTOLIN HFA, PROAIR HFA) 90 mcg/actuation inhaler Take 2 Puffs by inhalation every four (4) hours as needed for Wheezing or Shortness of Breath. Indications: BRONCHOSPASM PREVENTION 1 Inhaler 0   ??? fluticasone (FLONASE) 50 mcg/actuation nasal spray 2 Sprays by Both Nostrils route daily. (Patient taking differently: 2 Sprays by Both Nostrils route daily. As needed) 1 Bottle 5       Allergies   Allergen Reactions   ??? Tramadol Other (comments)     Psychotic Hallucinations    ??? Penicillamine Itching   ??? Pcn [Penicillins] Itching     Itching bumps on hands and arms       Past Medical History:   Diagnosis Date   ??? Automatic implantable cardiac defibrillator in situ     Post ddd icd Set up carelink   ??? Breast mass, left     benign   ??? CHF (congestive heart failure) ([facility] 06/23/2008    Non-ischemic CMP , Cath (2008) Normal coronary arteries   ??? Chronic systolic heart failure ([facility])     Stable,    ??? Degenerative arthritis of left knee    ??? Fibroid 06/23/2008   ??? HLD (hyperlipidemia)    ??? HTN (hypertension) 06/23/2008   ??? Hypotension, unspecified     Related to lisinopril   ??? 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 Left knee pain     With swelling   ??? Leg pain    ??? 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 OSA (obstructive sleep apnea) 9/10   ??? Venous reflux    ??? Wears glasses        Past Surgical History:   Procedure Laterality Date   ??? HX BREAST BIOPSY      rt breast, benign   ??? HX CHOLECYSTECTOMY     ??? HX CRANIOTOMY  2013    meningioma    ??? HX HEENT      glasses   ??? HX PACEMAKER      dual chamber icd   ??? HX TONSIL AND ADENOIDECTOMY     ??? HX TOTAL ABDOMINAL HYSTERECTOMY  1996   ??? HX TUBAL LIGATION         Family History   Problem Relation Age of Onset   ??? Diabetes Mother    ??? Heart Attack Father      MI   ??? Heart Disease Maternal Grandmother    ??? Heart Disease Paternal Grandmother    ??? Kidney Disease Other      1 sib deceased   ??? Cancer Other      1 sib throat cancer   ??? Diabetes Other    ??? Diabetes Daughter    ??? Other Daughter      leukemia   ??? Hypertension Other        Social History   Substance Use Topics   ??? Smoking status: Never Smoker   ??? Smokeless tobacco: Never Used   ??? Alcohol use No       ROS   Review of Systems   Constitutional: Positive for malaise/fatigue and weight loss. Negative for chills and fever.   HENT: Negative for ear pain and sore throat.    Eyes: Negative for blurred vision and pain.  Respiratory: Negative for cough and shortness of breath.    Cardiovascular: Positive for chest pain (rarely has CP- unchanged since her last Cardiology apt).   Gastrointestinal: Negative for abdominal pain, blood in stool and melena.   Genitourinary: Negative for dysuria and hematuria.   Musculoskeletal: Negative for joint pain and myalgias.   Skin: Negative for rash.   Neurological: Positive for tingling (RUE off/on). Negative for focal weakness and headaches.   Endo/Heme/Allergies: Does not bruise/bleed easily.   Psychiatric/Behavioral: Negative for substance abuse.       Objective     Vitals:    03/20/16 0807   BP: 100/59   Pulse: (!) 51   Resp: 18   Temp: 96.6 ??F (35.9 ??C)   SpO2: 99%   Weight: 201 lb (91.2 kg)   Height: 5' 9"  (1.753 m)   PainSc:   0 - No pain       Physical Exam   Constitutional: She is oriented to person, place, and time and well-developed, well-nourished, and in no distress.   HENT:   Head: Normocephalic and atraumatic.   Right Ear: External ear normal.   Left Ear: External ear normal.   Nose: Nose normal.   Mouth/Throat: Oropharynx is clear and moist. No oropharyngeal exudate.   Eyes: Conjunctivae and EOM are normal. Pupils are equal, round, and reactive to light. Right eye exhibits no discharge. Left eye exhibits no discharge. No scleral icterus.   Neck: Neck supple.   Cardiovascular: Normal rate, regular rhythm, normal heart sounds and intact distal pulses.  Exam reveals no gallop and no friction rub.    No murmur heard.  Pulmonary/Chest: Effort normal and breath sounds normal. No respiratory distress. She has no wheezes. She has no rales.   Abdominal: Soft. Bowel sounds are normal. She exhibits no distension. There is no tenderness. There is no rebound and no guarding.   Musculoskeletal: She exhibits no edema or tenderness (BUE).   Lymphadenopathy:     She has no cervical adenopathy.   Neurological: She is alert and oriented to person, place, and time. She  exhibits normal muscle tone. Gait normal.   Skin: Skin is warm and dry. No erythema.   Psychiatric: Affect normal.   Nursing note and vitals reviewed.      LABS   Data Review:   Lab Results   Component Value Date/Time    WBC 5.7 03/04/2016 01:29 PM    Hemoglobin, POC 12.2 01/11/2016 11:28 AM    HGB 12.3 03/04/2016 01:29 PM    Hematocrit, POC 36 01/11/2016 11:28 AM    HCT 37.9 03/04/2016 01:29 PM    PLATELET 247 03/04/2016 01:29 PM    MCV 95.2 03/04/2016 01:29 PM       Lab Results   Component Value Date/Time    Sodium 142 03/04/2016 01:29 PM    Potassium 4.1 03/04/2016 01:29 PM    Chloride 105 03/04/2016 01:29 PM    CO2 31 03/04/2016 01:29 PM    Anion gap 6 03/04/2016 01:29 PM    Glucose 89 03/04/2016 01:29 PM    BUN 11 03/04/2016 01:29 PM    Creatinine 0.72 03/04/2016 01:29 PM    BUN/Creatinine ratio 15 03/04/2016 01:29 PM    GFR est AA >60 03/04/2016 01:29 PM    GFR est non-AA >60 03/04/2016 01:29 PM    Calcium 8.4 03/04/2016 01:29 PM       Lab Results   Component Value Date/Time    Cholesterol, total 161 04/09/2015 08:11  AM    HDL Cholesterol 73 04/09/2015 08:11 AM    LDL, calculated 79.8 04/09/2015 08:11 AM    VLDL, calculated 8.2 04/09/2015 08:11 AM    Triglyceride 41 04/09/2015 08:11 AM    CHOL/HDL Ratio 2.2 04/09/2015 08:11 AM       Lab Results   Component Value Date/Time    Hemoglobin A1c 5.8 12/09/2012 10:50 AM       Assessment/Plan:   1. Thyroid Nodules: Suspicious thyroid nodules are present on her most recent ultrasound.  -???I encouraged patient to follow-up with the ENT team.  Requesting records.    2.  Congestive heart failure: Stable on 20 mg of Lasix, coreg, and ACEi. Her BP will not tolerate Entresto in place of lisinopril. C/w lisinopril and coreg for now. BP and fatigue improved with reducing her lasix dose.   ??????I encouraged the patient to follow-up with the cardiology team.  I encouraged her to do daily weight checks and notify us if she gains more  than 3 pounds in 24 hours or 5 pounds in 1 week.  I encouraged her to maintain a heart healthy diet.  ???Continue with lisinopril and carvedilol.  She can continue to take Lasix 20 mg daily.    3.  Osteoporosis:  ???We discussed various options for treatment.  Her next Prolia shot should be in roughly 5 months.  We will need to follow-up on this topic.  She will think about whether or not she wants to continue with treatment.      Health Maintenance Due   Topic Date Due   ??? DTaP/Tdap/Td series (1 - Tdap) 04/[birth year]/1971   ??? ZOSTER VACCINE AGE 59>  02/08/2010   ??? GLAUCOMA SCREENING Q2Y  04/12/2015     Lab review: labs are reviewed in the EHR    I have discussed the diagnosis with the patient and the intended plan as seen in the above orders.  The patient has received an after-visit summary and questions were answered concerning future plans.  I have discussed medication side effects and warnings with the patient as well. I have reviewed the plan of care with the patient, accepted their input and they are in agreement with the treatment goals. All questions were answered. The patient understands the plan of care. Handouts provided today with above information. Pt instructed if symptoms worsen to call the office or report to the ED for continued care.  Greater than 50% of the visit time was spent in counseling and/or coordination of care.        Follow-up Disposition:  Return in about 4 months (around 07/18/2016) for BP check.    Leo Rod, MD

## 2016-03-20 NOTE — Progress Notes (Signed)
1. Have you been to the ER, urgent care clinic since your last visit?  Hospitalized since your last visit?No    2. Have you seen or consulted any other health care providers outside of the [facility] since your last visit?  Include any pap smears or colon screening. No

## 2016-03-24 ENCOUNTER — Encounter

## 2016-04-02 ENCOUNTER — Emergency Department: Admit: 2016-04-03 | Payer: MEDICARE | Primary: Family Medicine

## 2016-04-02 DIAGNOSIS — R0789 Other chest pain: Secondary | ICD-10-CM

## 2016-04-02 NOTE — ED Provider Notes (Signed)
EMERGENCY DEPARTMENT HISTORY AND PHYSICAL EXAM    9:43 PM      Date: 04/02/2016  Patient Name: Cathy [name]    History of Presenting Illness     Chief Complaint   Patient presents with   ??? Shortness of Breath         History Provided By: Patient    Chief Complaint: SOB  Duration:  Days  Timing:  Acute and Intermittent  Location: Lungs  Quality: Pressure  Severity: Moderate  Modifying Factors: worse on exertion and laying down  Associated Symptoms: chest pain, dry cough      Additional History (Context):Cathy [name] is a 66 y.o. female with history of CHF, HTN, HLD, Obstructive Sleep Apnea,  who presents to the ED c/o SOB. Pt reports that SOB began "days ago" and has been intermittent. Pt notes SOB worsens on exertion and with laying down. Pacemaker and Defibrillator in left upper chest per pt did not "go off". Pt c/o chest pressure tonight in addition to SOB. Chest pressure located epigastrically and tonight has lasted for approximately 1 hour. In the past has only lasted few minutes then resolved per pt. Pt also c/o dry cough. Pt followed by cardiology (Dr. Posey Pronto). Currently on Lasix 20 mg. Not on blood thinners or Aspirin. Pt denies fever, sinus congestion, runny nose, nausea, leg pain/swelling, hx of smoking or any other acute sx at this time.    PCP: Leo Rod, MD    Current Outpatient Prescriptions   Medication Sig Dispense Refill   ??? lisinopril (PRINIVIL, ZESTRIL) 5 mg tablet Take 1 Tab by mouth two (2) times a day. 180 Tab 1   ??? carvedilol (COREG) 3.125 mg tablet Take 1 Tab by mouth two (2) times a day. 180 Tab 1   ??? furosemide (LASIX) 20 mg tablet Take 1 Tab by mouth daily. 90 Tab 3   ??? CRESTOR 5 mg tablet TAKE ONE TABLET BY MOUTH EVERY EVENING 90 Tab 3   ??? calcium-vitamin D (CALCIUM 600 + D,3,) 600 mg(1,500mg ) -200 unit tab Take 1 Tab by mouth two (2) times daily (with meals).     ??? albuterol (PROVENTIL HFA, VENTOLIN HFA, PROAIR HFA) 90 mcg/actuation  inhaler Take 2 Puffs by inhalation every four (4) hours as needed for Wheezing or Shortness of Breath. Indications: BRONCHOSPASM PREVENTION 1 Inhaler 0   ??? fluticasone (FLONASE) 50 mcg/actuation nasal spray 2 Sprays by Both Nostrils route daily. (Patient taking differently: 2 Sprays by Both Nostrils route daily. As needed) 1 Bottle 5       Past History     Past Medical History:  Past Medical History:   Diagnosis Date   ??? Automatic implantable cardiac defibrillator in situ     Post ddd icd Set up [facility]   ??? Breast mass, left     benign   ??? CHF (congestive heart failure) ([place]) 06/23/2008    Non-ischemic CMP , Cath (2008) Normal coronary arteries   ??? Chronic systolic heart failure (HCC)     Stable,    ??? Degenerative arthritis of left knee    ??? Fibroid 06/23/2008   ??? HLD (hyperlipidemia)    ??? HTN (hypertension) 06/23/2008   ??? Hypotension, unspecified     Related to lisinopril   ??? 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 Left knee pain     With swelling   ??? Leg pain    ??? 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 OSA (obstructive sleep apnea) 9/10   ??? Venous reflux    ??? Wears glasses        Past Surgical History:  Past Surgical History:   Procedure Laterality Date   ??? HX BREAST BIOPSY      rt breast, benign   ??? HX CHOLECYSTECTOMY     ??? HX CRANIOTOMY  2013    meningioma    ??? HX HEENT      glasses   ??? HX PACEMAKER      dual chamber icd   ??? HX TONSIL AND ADENOIDECTOMY     ??? HX TOTAL ABDOMINAL HYSTERECTOMY  1996   ??? HX TUBAL LIGATION         Family History:  Family History   Problem Relation Age of Onset   ??? Diabetes Mother    ??? Heart Attack Father      MI   ??? Heart Disease Maternal Grandmother    ??? Heart Disease Paternal Grandmother    ??? Kidney Disease Other      1 sib deceased   ??? Cancer Other      1 sib throat cancer   ??? Diabetes Other    ??? Diabetes Daughter    ??? Other Daughter      leukemia   ??? Hypertension Other        Social History:  Social History    Substance Use Topics   ??? Smoking status: Never Smoker   ??? Smokeless tobacco: Never Used   ??? Alcohol use No       Allergies:  Allergies   Allergen Reactions   ??? Tramadol Other (comments)     Psychotic Hallucinations   ??? Penicillamine Itching   ??? Pcn [Penicillins] Itching     Itching bumps on hands and arms         Review of Systems     Review of Systems   Constitutional: Negative for fever.   HENT: Negative for congestion.    Respiratory: Positive for cough and shortness of breath.    Cardiovascular: Positive for chest pain.   Gastrointestinal: Negative for abdominal pain and vomiting.   Musculoskeletal: Negative for back pain.   Skin: Negative for rash.   Neurological: Negative for light-headedness.   All other systems reviewed and are negative.        Physical Exam     Visit Vitals   ??? BP 104/73   ??? Pulse 95   ??? Temp 98.1 ??F (36.7 ??C)   ??? Resp 30   ??? Ht 5\' 11"  (1.803 m)   ??? Wt 89.8 kg (198 lb)   ??? SpO2 95%   ??? BMI 27.62 kg/m2         Physical Exam   Constitutional: She is oriented to person, place, and time. She appears well-developed.   HENT:   Head: Normocephalic.   Mouth/Throat: Oropharynx is clear and moist.   Eyes: Pupils are equal, round, and reactive to light.   Neck: Normal range of motion.   Cardiovascular: Normal rate and normal heart sounds.    No murmur heard.  Pulmonary/Chest: Effort normal. She has no wheezes. She has no rales.   Abdominal: Soft. There is no tenderness.   Musculoskeletal: Normal range of motion. She exhibits no edema.   Neurological: She is alert and oriented to person, place, and time.   Skin: Skin is warm and dry.   Nursing note and vitals reviewed.  Diagnostic Study Results     Vitals:  Patient Vitals for the past 12 hrs:   Temp Pulse Resp BP SpO2   04/03/16 0145 - 95 30 104/73 95 %   04/03/16 0115 - 93 25 125/73 94 %   04/03/16 0030 - 91 26 105/66 98 %   04/03/16 0018 98.1 ??F (36.7 ??C) 93 18 116/68 93 %   04/03/16 0005 - 97 26 - 94 %   04/03/16 0004 - - - 109/52 -    04/02/16 2300 - 92 23 (!) 118/91 98 %   04/02/16 2251 - 90 24 97/78 98 %   04/02/16 2250 - - - - 95 %   04/02/16 2230 - - - 109/57 96 %   04/02/16 2146 98 ??F (36.7 ??C) 90 - 104/74 96 %         Medications ordered:   Medications   aspirin chewable tablet 81 mg (81 mg Oral Given 04/02/16 2244)   0.9% sodium chloride infusion 250 mL (250 mL IntraVENous New Bag 04/02/16 2244)   furosemide (LASIX) injection 20 mg (20 mg IntraVENous Given 04/02/16 2256)   iopamidol (ISOVUE 300) 61 % contrast injection 80 mL (80 mL IntraVENous Given 04/02/16 2348)   nitroglycerin (NITROBID) 2 % ointment 0.5 Inch (0.5 Inches Topical Given 04/03/16 0025)         Lab findings:  Recent Results (from the past 12 hour(s))   EKG, 12 LEAD, INITIAL    Collection Time: 04/02/16  9:52 PM   Result Value Ref Range    Ventricular Rate 98 BPM    Atrial Rate 98 BPM    P-R Interval 174 ms    QRS Duration 116 ms    Q-T Interval 392 ms    QTC Calculation (Bezet) 500 ms    Calculated P Axis 55 degrees    Calculated R Axis -6 degrees    Calculated T Axis 141 degrees    Diagnosis       Sinus rhythm with occasional premature ventricular complexes  Possible Left atrial enlargement  Left ventricular hypertrophy with QRS widening and repolarization abnormality  Cannot rule out Septal infarct , age undetermined  Abnormal ECG  When compared with ECG of 06-May-2015 03:20,  premature ventricular complexes are now present     PROTHROMBIN TIME + INR    Collection Time: 04/02/16 10:00 PM   Result Value Ref Range    Prothrombin time 14.2 11.5 - 15.2 sec    INR 1.2 0.8 - 1.2     PTT    Collection Time: 04/02/16 10:00 PM   Result Value Ref Range    aPTT 30.9 23.0 - 36.4 SEC   CBC WITH AUTOMATED DIFF    Collection Time: 04/02/16 10:00 PM   Result Value Ref Range    WBC 4.4 (L) 4.6 - 13.2 K/uL    RBC 3.64 (L) 4.20 - 5.30 M/uL    HGB 10.9 (L) 12.0 - 16.0 g/dL    HCT 34.4 (L) 35.0 - 45.0 %    MCV 94.5 74.0 - 97.0 FL    MCH 29.9 24.0 - 34.0 PG    MCHC 31.7 31.0 - 37.0 g/dL     RDW 13.2 11.6 - 14.5 %    PLATELET 189 135 - 420 K/uL    MPV 11.7 9.2 - 11.8 FL    NEUTROPHILS 56 40 - 73 %    LYMPHOCYTES 36 21 - 52 %    MONOCYTES 6 3 - 10 %  EOSINOPHILS 1 0 - 5 %    BASOPHILS 1 0 - 2 %    ABS. NEUTROPHILS 2.5 1.8 - 8.0 K/UL    ABS. LYMPHOCYTES 1.6 0.9 - 3.6 K/UL    ABS. MONOCYTES 0.3 0.05 - 1.2 K/UL    ABS. EOSINOPHILS 0.0 0.0 - 0.4 K/UL    ABS. BASOPHILS 0.0 0.0 - 0.06 K/UL    DF AUTOMATED     METABOLIC PANEL, BASIC    Collection Time: 04/02/16 10:00 PM   Result Value Ref Range    Sodium 143 136 - 145 mmol/L    Potassium 3.7 3.5 - 5.5 mmol/L    Chloride 107 100 - 108 mmol/L    CO2 28 21 - 32 mmol/L    Anion gap 8 3.0 - 18 mmol/L    Glucose 113 (H) 74 - 99 mg/dL    BUN 15 7.0 - 18 MG/DL    Creatinine 0.75 0.6 - 1.3 MG/DL    BUN/Creatinine ratio 20 12 - 20      GFR est AA >60 >60 ml/min/1.82m2    GFR est non-AA >60 >60 ml/min/1.30m2    Calcium 8.5 8.5 - 10.1 MG/DL   CARDIAC PANEL,(CK, CKMB & TROPONIN)    Collection Time: 04/02/16 10:00 PM   Result Value Ref Range    CK 37 26 - 192 U/L    CK - MB <1.0 <3.6 ng/ml    CK-MB Index  0.0 - 4.0 %     CALCULATION NOT PERFORMED WHEN RESULT IS BELOW LINEAR LIMIT    Troponin-I, Qt. <0.02 0.00 - 0.06 NG/ML   D DIMER    Collection Time: 04/02/16 10:00 PM   Result Value Ref Range    D DIMER 0.72 (H) <0.46 ug/ml(FEU)   LIPASE    Collection Time: 04/02/16 10:00 PM   Result Value Ref Range    Lipase 119 73 - 393 U/L   NT-PRO BNP    Collection Time: 04/02/16 10:00 PM   Result Value Ref Range    NT pro-BNP 2987 (H) 0 - 900 PG/ML   EKG, 12 LEAD, SUBSEQUENT    Collection Time: 04/03/16 12:15 AM   Result Value Ref Range    Ventricular Rate 94 BPM    Atrial Rate 94 BPM    P-R Interval 178 ms    QRS Duration 116 ms    Q-T Interval 368 ms    QTC Calculation (Bezet) 460 ms    Calculated P Axis 56 degrees    Calculated R Axis -4 degrees    Calculated T Axis 137 degrees    Diagnosis       Sinus rhythm with frequent premature ventricular complexes and fusion   complexes   Possible Left atrial enlargement  Septal infarct (cited on or before 02-Apr-2016)  ST & T wave abnormality, consider lateral ischemia  Abnormal ECG  When compared with ECG of 02-Apr-2016 21:52,  fusion complexes are now present  Serial changes of Septal infarct present     CARDIAC PANEL,(CK, CKMB & TROPONIN)    Collection Time: 04/03/16  1:42 AM   Result Value Ref Range    CK 33 26 - 192 U/L    CK - MB <1.0 <3.6 ng/ml    CK-MB Index  0.0 - 4.0 %     CALCULATION NOT PERFORMED WHEN RESULT IS BELOW LINEAR LIMIT    Troponin-I, Qt. 0.02 0.00 - 0.06 NG/ML           X-Ray, CT  or other radiology findings or impressions:  CTA CHEST W OR W WO CONT   Final Result   IMPRESSION:  ??  1. No evidence of pulmonary embolism.  -Moderately enlarged heart, predominantly left sided chambers. 2-lead left  subclavian pacer.  -Mild interlobular septal thickening in the lung apices may represent early  edema.  ??  2. Left thyroid lobe calcifications corresponds with suspicious nodule on recent  sonogram.  ??  3. Dilated biliary tree, likely reservoir effect postcholecystectomy.  -A 1.2 cm hypodense nodule along posterior gastric fundus, possibly diverticulum  or left adrenal nodule.  -Consider contrast-enhanced CT or MRI to further characterize at some point.   XR CHEST PORT    (Results Pending)       Progress notes, Consult notes or additional Procedure notes:   2:22 AM Per pt, last stress test was in 2010. No provocative cardiac testing since then.    2:24 AM Consult: I discussed care with Dr. Bernerd Limbo (Hospitalist).  It was a standard discussion including patient history, chief complaint, available diagnostic results, and predicted treatment course. Accepts admission.       Disposition:  Diagnosis:   1. Chest pain, unspecified type    2. Acute on chronic congestive heart failure, unspecified congestive heart failure type ([place])        Disposition: ADMIT    Follow-up Information     None           Patient's Medications   Start Taking     No medications on file   Continue Taking    ALBUTEROL (PROVENTIL HFA, VENTOLIN HFA, PROAIR HFA) 90 MCG/ACTUATION INHALER    Take 2 Puffs by inhalation every four (4) hours as needed for Wheezing or Shortness of Breath. Indications: BRONCHOSPASM PREVENTION    CALCIUM-VITAMIN D (CALCIUM 600 + D,3,) 600 MG(1,500MG ) -200 UNIT TAB    Take 1 Tab by mouth two (2) times daily (with meals).    CARVEDILOL (COREG) 3.125 MG TABLET    Take 1 Tab by mouth two (2) times a day.    CRESTOR 5 MG TABLET    TAKE ONE TABLET BY MOUTH EVERY EVENING    FLUTICASONE (FLONASE) 50 MCG/ACTUATION NASAL SPRAY    2 Sprays by Both Nostrils route daily.    FUROSEMIDE (LASIX) 20 MG TABLET    Take 1 Tab by mouth daily.    LISINOPRIL (PRINIVIL, ZESTRIL) 5 MG TABLET    Take 1 Tab by mouth two (2) times a day.   These Medications have changed    No medications on file   Stop Taking    No medications on file         [place] acting as a scribe for and in the presence of Darlina Rumpf, MD      April 02, 2016 at 9:58 PM       Provider Attestation:      I personally performed the services described in the documentation, reviewed the documentation, as recorded by the scribe in my presence, and it accurately and completely records my words and actions. April 02, 2016 at 9:58 PM - Darlina Rumpf, MD

## 2016-04-02 NOTE — ED Triage Notes (Addendum)
Patient states shortness of breath x few days. States worsening of shortness of breath today.  C/o pain to left breast. Left ribs under breast and epigastric abdomen.

## 2016-04-03 ENCOUNTER — Observation Stay: Admit: 2016-04-03 | Payer: MEDICARE | Primary: Family Medicine

## 2016-04-03 ENCOUNTER — Inpatient Hospital Stay
Admit: 2016-04-03 | Discharge: 2016-04-03 | Disposition: A | Discharge status: Home / Self Care | Payer: MEDICARE | Attending: Emergency Medicine

## 2016-04-03 LAB — CBC WITH AUTOMATED DIFF
ABS. BASOPHILS: 0 10*3/uL (ref 0.0–0.06)
ABS. BASOPHILS: 0 10*3/uL (ref 0.0–0.06)
ABS. EOSINOPHILS: 0 10*3/uL (ref 0.0–0.4)
ABS. EOSINOPHILS: 0 10*3/uL (ref 0.0–0.4)
ABS. LYMPHOCYTES: 1.6 10*3/uL (ref 0.9–3.6)
ABS. LYMPHOCYTES: 0.8 10*3/uL — ABNORMAL LOW (ref 0.9–3.6)
ABS. MONOCYTES: 0.3 10*3/uL (ref 0.05–1.2)
ABS. MONOCYTES: 0.3 10*3/uL (ref 0.05–1.2)
ABS. NEUTROPHILS: 2.5 10*3/uL (ref 1.8–8.0)
ABS. NEUTROPHILS: 4.5 10*3/uL (ref 1.8–8.0)
BASOPHILS: 1 % (ref 0–2)
BASOPHILS: 0 % (ref 0–2)
EOSINOPHILS: 1 % (ref 0–5)
EOSINOPHILS: 0 % (ref 0–5)
HCT: 34.4 % — ABNORMAL LOW (ref 35.0–45.0)
HCT: 34.4 % — ABNORMAL LOW (ref 35.0–45.0)
HGB: 10.9 g/dL — ABNORMAL LOW (ref 12.0–16.0)
HGB: 11 g/dL — ABNORMAL LOW (ref 12.0–16.0)
LYMPHOCYTES: 36 % (ref 21–52)
LYMPHOCYTES: 14 % — ABNORMAL LOW (ref 21–52)
MCH: 29.9 PG (ref 24.0–34.0)
MCH: 30.1 PG (ref 24.0–34.0)
MCHC: 31.7 g/dL (ref 31.0–37.0)
MCHC: 32 g/dL (ref 31.0–37.0)
MCV: 94.5 FL (ref 74.0–97.0)
MCV: 94 FL (ref 74.0–97.0)
MONOCYTES: 6 % (ref 3–10)
MONOCYTES: 6 % (ref 3–10)
MPV: 11.7 FL (ref 9.2–11.8)
MPV: 11.5 FL (ref 9.2–11.8)
NEUTROPHILS: 56 % (ref 40–73)
NEUTROPHILS: 80 % — ABNORMAL HIGH (ref 40–73)
PLATELET: 189 10*3/uL (ref 135–420)
PLATELET: 194 10*3/uL (ref 135–420)
RBC: 3.64 M/uL — ABNORMAL LOW (ref 4.20–5.30)
RBC: 3.66 M/uL — ABNORMAL LOW (ref 4.20–5.30)
RDW: 13.2 % (ref 11.6–14.5)
RDW: 13 % (ref 11.6–14.5)
WBC: 4.4 10*3/uL — ABNORMAL LOW (ref 4.6–13.2)
WBC: 5.6 10*3/uL (ref 4.6–13.2)

## 2016-04-03 LAB — CARDIAC PANEL,(CK, CKMB & TROPONIN)
CK - MB: <1 ng/ml (ref <3.6)
CK - MB: <1 ng/ml (ref <3.6)
CK - MB: <1 ng/ml (ref <3.6)
CK: 37 U/L (ref 26–192)
CK: 33 U/L (ref 26–192)
CK: 33 U/L (ref 26–192)
Troponin-I, QT: <0.02 NG/ML (ref 0.00–0.06)
Troponin-I, QT: 0.02 NG/ML (ref 0.00–0.06)
Troponin-I, QT: <0.02 NG/ML (ref 0.00–0.06)

## 2016-04-03 LAB — METABOLIC PANEL, BASIC
Anion gap: 8 mmol/L (ref 3.0–18)
Anion gap: 10 mmol/L (ref 3.0–18)
BUN/Creatinine ratio: 20 (ref 12–20)
BUN/Creatinine ratio: 17 (ref 12–20)
BUN: 15 MG/DL (ref 7.0–18)
BUN: 12 MG/DL (ref 7.0–18)
CO2: 28 mmol/L (ref 21–32)
CO2: 28 mmol/L (ref 21–32)
Calcium: 8.5 MG/DL (ref 8.5–10.1)
Calcium: 8.6 MG/DL (ref 8.5–10.1)
Chloride: 107 mmol/L (ref 100–108)
Chloride: 108 mmol/L (ref 100–108)
Creatinine: 0.75 MG/DL (ref 0.6–1.3)
Creatinine: 0.69 MG/DL (ref 0.6–1.3)
GFR est AA: >60 mL/min/{1.73_m2} (ref >60)
GFR est AA: >60 mL/min/{1.73_m2} (ref >60)
GFR est non-AA: >60 mL/min/{1.73_m2} (ref >60)
GFR est non-AA: >60 mL/min/{1.73_m2} (ref >60)
Glucose: 113 mg/dL — ABNORMAL HIGH (ref 74–99)
Glucose: 116 mg/dL — ABNORMAL HIGH (ref 74–99)
Potassium: 3.7 mmol/L (ref 3.5–5.5)
Potassium: 4.1 mmol/L (ref 3.5–5.5)
Sodium: 143 mmol/L (ref 136–145)
Sodium: 146 mmol/L — ABNORMAL HIGH (ref 136–145)

## 2016-04-03 LAB — PTT: aPTT: 30.9 s (ref 23.0–36.4)

## 2016-04-03 LAB — D DIMER: D DIMER: 0.72 ug/ml(FEU) — ABNORMAL HIGH (ref <0.46)

## 2016-04-03 LAB — PROTHROMBIN TIME + INR
INR: 1.2 (ref 0.8–1.2)
Prothrombin time: 14.2 s (ref 11.5–15.2)

## 2016-04-03 LAB — LIPASE: Lipase: 119 U/L (ref 73–393)

## 2016-04-03 LAB — NT-PRO BNP
NT pro-BNP: 2987 PG/ML — ABNORMAL HIGH (ref 0–900)
NT pro-BNP: 3844 PG/ML — ABNORMAL HIGH (ref 0–900)

## 2016-04-03 LAB — D-DIMER, QUANTITATIVE: D-Dimer, Quant: 0.72 ug/ml(FEU) — ABNORMAL HIGH (ref <0.46)

## 2016-04-03 MED ORDER — SODIUM CHLORIDE 0.9 % IV
Freq: Once | INTRAVENOUS | Status: AC
Start: 2016-04-03 — End: 2016-04-03
  Administered 2016-04-03: 04:00:00 via INTRAVENOUS

## 2016-04-03 MED ORDER — IOPAMIDOL 61 % IV SOLN
300 mg iodine /mL (61 %) | Freq: Once | INTRAVENOUS | Status: AC
Start: 2016-04-03 — End: 2016-04-02
  Administered 2016-04-03: 05:00:00 via INTRAVENOUS

## 2016-04-03 MED ORDER — FUROSEMIDE 10 MG/ML IJ SOLN
10 mg/mL | INTRAMUSCULAR | Status: AC
Start: 2016-04-03 — End: 2016-04-03
  Administered 2016-04-03: 18:00:00 via INTRAVENOUS

## 2016-04-03 MED ORDER — NITROGLYCERIN 2 % TRANSDERMAL OINTMENT
2 % | TRANSDERMAL | Status: AC
Start: 2016-04-03 — End: 2016-04-03
  Administered 2016-04-03: 05:00:00 via TOPICAL

## 2016-04-03 MED ORDER — ASPIRIN 325 MG TAB
325 mg | ORAL | Status: AC
Start: 2016-04-03 — End: 2016-04-03
  Administered 2016-04-03: 18:00:00 via ORAL

## 2016-04-03 MED ORDER — ASPIRIN 81 MG CHEWABLE TAB
81 mg | ORAL | Status: AC
Start: 2016-04-03 — End: 2016-04-02
  Administered 2016-04-03: 04:00:00 via ORAL

## 2016-04-03 MED ORDER — FUROSEMIDE 10 MG/ML IJ SOLN
10 mg/mL | INTRAMUSCULAR | Status: AC
Start: 2016-04-03 — End: 2016-04-02
  Administered 2016-04-03: 04:00:00 via INTRAVENOUS

## 2016-04-03 MED FILL — ASPIRIN 81 MG CHEWABLE TAB: 81 mg | ORAL | Qty: 1

## 2016-04-03 MED FILL — SODIUM CHLORIDE 0.9 % IV: INTRAVENOUS | Qty: 250

## 2016-04-03 MED FILL — ASPIRIN 325 MG TAB: 325 mg | ORAL | Qty: 1

## 2016-04-03 MED FILL — FUROSEMIDE 10 MG/ML IJ SOLN: 10 mg/mL | INTRAMUSCULAR | Qty: 4

## 2016-04-03 MED FILL — ISOVUE-300  61 % INTRAVENOUS SOLUTION: 300 mg iodine /mL (61 %) | INTRAVENOUS | Qty: 80

## 2016-04-03 MED FILL — NITRO-BID 2 % TRANSDERMAL OINTMENT: 2 % | TRANSDERMAL | Qty: 1

## 2016-04-03 NOTE — ED Notes (Signed)
Pt stated that she started having chest pressure again under left breast.  Pain developed and increased to 7/10.   Repeat ekd done and dr Wynetta Emery aware and at bedside. resp relaxed, denies any sob at this time

## 2016-04-03 NOTE — ED Notes (Signed)
Patient reports chest pain increasing from a 4 to a 7 on a scale of 0 to 10.  Dr Wynetta Emery notified, repeat ekg ordered by provider.

## 2016-04-03 NOTE — ED Notes (Signed)
Bedside and Verbal shift change report given to Maryruth Hancock, RN  (oncoming nurse) by Mitchel Honour RN (offgoing nurse). Report included the following information SBAR, Kardex, MAR and Recent Results.

## 2016-04-03 NOTE — ED Notes (Signed)
Hourly rounding complete. ??  Safety  Pt resting ??  [ x ] On stretcher with side rails up and bed in locked position, call bell within reach, changed to hospital bed for comfort while waiting for admission bed  [ ]  Sitting in chair with casters locked, call bell within reach  ??  Toileting  [  ] pt denies need to use bathroom  [x ] pt assisted to bathroom, via wheelchair, pt c/o sob on exertion.   [ ]  pt assisted with bedpan  [ ]  pt independent to bathroom as needed  ??  Ongoing Plan of Care  Plan of care and expected time for test and results reviewed with pt.  ??  Pain Management / Comfort  [ x] dimmed lights  [x ] warm blanket provided  [x ] pain assessed, remains 4/10 in chest area, no abd pain  [x ] monitor alarms reviewed , pvc on monitor noted  ??

## 2016-04-03 NOTE — ED Notes (Signed)
Food and beverage provided to pt upon request.

## 2016-04-03 NOTE — ED Notes (Signed)
Pt states that her pain comes and goes. Pain varies from 4-6 now. Pt resp relaxed, color pink . Family at bedside, remains with pvc on monitor

## 2016-04-03 NOTE — ED Notes (Signed)
12:25 PM- Completed rounding on an admitted Pt. Pt was re-evaluated. She has been in the ED for 14.5 hours. She was not seen by a hospitalist for admission. There is no bed available at [facility] yet. Pt feels that her breathing has improved however, admits to still having some chest tightness. Pt is asking to go home. Plan on repeat labs, CXR, and another dose of Lasix. Will re-evaluate.     2:10 PM- Re-evaluated the pt. Pt is feeling much better. SOB is resolved. CXR improved and second set of enzymes negative. Will discharge.       Scribe Attestation     Aleasa (Aj) Whitaker acting as a Education administrator for and in the presence of Zella Ball, MD     April 03, 2016 at 12:34 PM       Provider Attestation:      I personally performed the services described in the documentation, reviewed the documentation, as recorded by the scribe in my presence, and it accurately and completely records my words and actions. April 03, 2016 at 12:34 PM -  Zella Ball, MD

## 2016-04-03 NOTE — ED Notes (Signed)
I have reviewed discharge instructions with the patient.  The patient verbalized understanding.

## 2016-04-03 NOTE — ED Notes (Signed)
Hourly rounding complete. ??  Safety  Pt resting ??  [  ] On stretcher with side rails up and bed in locked position, call bell within reach  [ ]  Sitting in chair with casters locked, call bell within reach  ??  Toileting  [  ] pt denies need to use bathroom  [ ]  pt assisted to bathroom  [ ]  pt assisted with bedpan  [ ]  pt independent to bathroom as needed  ??  Ongoing Plan of Care  Plan of care and expected time for test and results reviewed with pt.  ??  Pain Management / Comfort  [ ]  dimmed lights  [ ]  warm blanket provided  [ ]  pain assessed  [ ]  monitor alarms reviewed  ??Hourly rounding complete. ??  Safety  Pt resting ??  [ x ] On stretcher with side rails up and bed in locked position, call bell within reach  [ ]  Sitting in chair with casters locked, call bell within reach  ??  Toileting  [  x] pt denies need to use bathroom, pt used bathroom upon going to ctscan  [ ]  pt assisted to bathroom  [ ]  pt assisted with bedpan  [ ]  pt independent to bathroom as needed  ??  Ongoing Plan of Care  Plan of care and expected time for test and results reviewed with pt.  ??  Pain Management / Comfort  [x ] dimmed lights  [x ] warm blanket provided  [ x] pain assessed, pain remains 4/10  [ x] monitor alarms reviewed  ??

## 2016-04-04 ENCOUNTER — Ambulatory Visit: Payer: MEDICARE | Primary: Family Medicine

## 2016-04-04 LAB — EKG, 12 LEAD, SUBSEQUENT
Atrial Rate: 94 {beats}/min
Calculated P Axis: 56 degrees
Calculated R Axis: -4 degrees
Calculated T Axis: 137 degrees
P-R Interval: 178 ms
Q-T Interval: 368 ms
QRS Duration: 116 ms
QTC Calculation (Bezet): 460 ms
Ventricular Rate: 94 {beats}/min

## 2016-04-04 LAB — EKG, 12 LEAD, INITIAL
Atrial Rate: 98 {beats}/min
Calculated P Axis: 55 degrees
Calculated R Axis: -6 degrees
Calculated T Axis: 141 degrees
P-R Interval: 174 ms
Q-T Interval: 392 ms
QRS Duration: 116 ms
QTC Calculation (Bezet): 500 ms
Ventricular Rate: 98 {beats}/min

## 2016-04-04 LAB — EKG 12-LEAD
Atrial Rate: 94 {beats}/min
Atrial Rate: 98 {beats}/min
P Axis: 55 degrees
P Axis: 56 degrees
P-R Interval: 174 ms
P-R Interval: 178 ms
Q-T Interval: 368 ms
Q-T Interval: 392 ms
QRS Duration: 116 ms
QRS Duration: 116 ms
QTc Calculation (Bazett): 460 ms
QTc Calculation (Bazett): 500 ms
R Axis: -4 degrees
R Axis: -6 degrees
T Axis: 137 degrees
T Axis: 141 degrees
Ventricular Rate: 94 {beats}/min
Ventricular Rate: 98 {beats}/min

## 2016-04-04 NOTE — Progress Notes (Signed)
ED Discharge Follow-Up    Date/Time:  04/04/2016 2:59 PM    Patient was admitted to [facility] ED on 04/02/16 and discharged on 04/03/16 for CP/acute on chronic CHF.      Presenting symptoms: difficulty breathing    Nurse Navigator(NN) contacted the patient by telephone to perform post ED discharge assessment. Verified name and DOB with patient as identifiers. Provided introduction to self, and explanation of the Nurses Navigator role. Patient contacted within 1 business day of discharge.    Subjective data: intermittent dry cough, pain under umbilicus that is TTP, "feels like a little round thing", DOE with ADL's, decreased appetite X 1 week, "little in my chest this morning and a little this afternoon", dizziness    Pertinent negatives: worsening SOB/DOE, N/V, swelling in abdomen, edema, diaphoresis, radiating arm pain,     Medication:   New medications at discharge include:  None  Does the patient have new prescription(s) to last until follow up with prescribing provider: yes  Medication changes (dose adjustments or discontinued meds): none     Patient communicated she lies on side using on pillow at night. Does admit to DOE with ADL's. Patient reported she does not weigh daily. Explained the importance of weighing: provides early indication of fluid retention which may cause exacerbation. Instructed patient to begin weighing self every morning after urinating but before eating breakfast and writing weight and time on a calendar. Advised patient to report weight gain > 3 lbs or more in 24 hrs (overnight)or 5 lbs or greater over a week. Patient voiced understanding.    Patient reported she does not check her BP daily. Patient has an arm cuff at home. Advised patient to check BP before taking BP medications d/t hx of hypotension. Patient verbalized she was told by her heart doctor not to take BP medications if BP was below 100. Writer reinforced the importance  to of checking BP prior to taking BP med to determine if BP meds are to taken or held. Patient verbalized understanding and stated "ok".    Patient reported pain below umbilicus. TTP. Communicated it feels like "a little round thing". Pain exacerbated by moving from sitting to standing position and coughing.     Reviewed discharge instructions and red flags (worsening of CP or SOB, edema, persistent dry cough, abdominal swelling, diaphoresis, N/V that prevents eating drinking or taking meds) with  patient who verbalizes understanding. Patient given an opportunity to ask questions and does not have any further questions or concerns at this time. The patient agrees to contact the PCP office for questions related to their healthcare. Patient reminded that there are physicians on call 24 hours a day / 7 days a week (M-F 5pm to 8am and from Friday 5pm until Monday 8a for the weekend) should the patient have questions or concerns.   NN provided contact information for future reference.    Offered follow up appointment with PCP: patient scheduled appt with PCP prior to call     Future Appointments  Date Time Provider [place]   04/07/2016 11:30 AM Leo Rod, MD [place]   04/09/2016 9:00 AM [facility] [place] RM 2 [facility]   04/15/2016 11:30 AM Tona Sensing, MD [facility] [place]   05/05/2016 10:00 AM Tona Sensing, MD [facility] [place]   05/09/2016 11:00 AM Tona Sensing, MD [place]   07/11/2016 11:00 AM [facility] NURSE 2 [facility]   07/17/2016 8:30 AM Janett Billow A  Cornelia Copa, MD [place]        Goals        Heart Failure    ??? Maintains daily weight.            Plan: Educated patient on the importance of daily weights.      ??? Reduce risk of CHF exacerbations and complications.            Plan: Review red flags of CHF exacerbation and what to report. Educated patient on when to seek medical attention from ED vs PCP.          Post ED     ??? Establish PCP relationships and regularly scheduled appointments.            Plan: Notify patient of upcoming appointments with PCP and Dr. Posey Pronto, cardiology and verify transportation.      ??? General            Plan: Instruct patient to check BP prior to taking BP meds to assess for hypotension.

## 2016-04-04 NOTE — Progress Notes (Signed)
Will have the pt scheduled for a f/u apt.    Dr. Marius Ditch  [facility], [place] [address]  Phone: [phone]  Fax: [phone]

## 2016-04-07 ENCOUNTER — Ambulatory Visit: Admit: 2016-04-07 | Discharge: 2016-04-07 | Payer: MEDICARE | Attending: Family Medicine | Primary: Family Medicine

## 2016-04-07 DIAGNOSIS — M79605 Pain in left leg: Secondary | ICD-10-CM

## 2016-04-07 NOTE — Patient Instructions (Signed)
Abdominal Pain: Care Instructions  Your Care Instructions    Abdominal pain has many possible causes. Some aren't serious and get better on their own in a few days. Others need more testing and treatment. If your pain continues or gets worse, you need to be rechecked and may need more tests to find out what is wrong. You may need surgery to correct the problem.  Don't ignore new symptoms, such as fever, nausea and vomiting, urination problems, pain that gets worse, and dizziness. These may be signs of a more serious problem.  Your doctor may have recommended a follow-up visit in the next 8 to 12 hours. If you are not getting better, you may need more tests or treatment.  The doctor has checked you carefully, but problems can develop later. If you notice any problems or new symptoms, get medical treatment right away.  Follow-up care is a key part of your treatment and safety. Be sure to make and go to all appointments, and call your doctor if you are having problems. It's also a good idea to know your test results and keep a list of the medicines you take.  How can you care for yourself at home?  ?? Rest until you feel better.  ?? To prevent dehydration, drink plenty of fluids, enough so that your urine is light yellow or clear like water. Choose water and other caffeine-free clear liquids until you feel better. If you have kidney, heart, or liver disease and have to limit fluids, talk with your doctor before you increase the amount of fluids you drink.  ?? If your stomach is upset, eat mild foods, such as rice, dry toast or crackers, bananas, and applesauce. Try eating several small meals instead of two or three large ones.  ?? Wait until 48 hours after all symptoms have gone away before you have spicy foods, alcohol, and drinks that contain caffeine.  ?? Do not eat foods that are high in fat.  ?? Avoid anti-inflammatory medicines such as aspirin, ibuprofen (Advil,  Motrin), and naproxen (Aleve). These can cause stomach upset. Talk to your doctor if you take daily aspirin for another health problem.  When should you call for help?  Call 911 anytime you think you may need emergency care. For example, call if:  ? ?? You passed out (lost consciousness).   ? ?? You pass maroon or very bloody stools.   ? ?? You vomit blood or what looks like coffee grounds.   ? ?? You have new, severe belly pain.   ?Call your doctor now or seek immediate medical care if:  ? ?? Your pain gets worse, especially if it becomes focused in one area of your belly.   ? ?? You have a new or higher fever.   ? ?? Your stools are black and look like tar, or they have streaks of blood.   ? ?? You have unexpected vaginal bleeding.   ? ?? You have symptoms of a urinary tract infection. These may include:  ?? Pain when you urinate.  ?? Urinating more often than usual.  ?? Blood in your urine.   ? ?? You are dizzy or lightheaded, or you feel like you may faint.   ?Watch closely for changes in your health, and be sure to contact your doctor if:  ? ?? You are not getting better after 1 day (24 hours).   Where can you learn more?  Go to http://www.healthwise.net/[facility].  Enter E907 in the search   box to learn more about "Abdominal Pain: Care Instructions."  Current as of: May 14, 2015  Content Version: 11.4  ?? 2006-2017 [employer]. Care instructions adapted under license by [employer] (which disclaims liability or warranty for this information). If you have questions about a medical condition or this instruction, always ask your healthcare professional. Healthwise, Inc[employer] disclaims any warranty or liability for your use of this information.

## 2016-04-07 NOTE — Progress Notes (Signed)
Chief Complaint   Patient presents with   ??? [facility] 04-02-16 and discharged on 04-03-16 for Acute on Chronic Congested Heart Failure and has made her own appointment with Cardiology for 04-15-16 to see Dr Posey Pronto     1. Have you been to the ER, urgent care clinic since your last visit?  Hospitalized since your last visit?Yes When: 04-02-16 Where: [facility] ER Reason: Acute on Chronic Congested Heart Failure.    2. Have you seen or consulted any other health care providers outside of the [facility] since your last visit?  Include any pap smears or colon screening. No

## 2016-04-07 NOTE — Progress Notes (Signed)
[facility]:  04/08/2016, MRN: [id number]      Cathy [name] is a 66 y.o. female and presents to clinic for Hospital Follow Up [facility] ER 04-02-16 and discharged on 04-03-16 for Acute on Chronic Congested Heart Failure and has made her own appointment with Cardiology for 04-15-16 to see Dr Posey Pronto)    Subjective:   The patient is a 66 year old female with history of hypertension, chronic systolic heart failure, mitral regurgitation, cholelithiasis, multinodular goiter, obstructive sleep apnea, allergic rhinitis, osteoporosis, uterine fibroids per EHR, insomnia, cataracts, AICD placement, hyperlipidemia, and meningioma status post resection.  ??  1. Thyroid nodule:  Present for several years.  Her most recent thyroid ultrasound showed evidence of suspicious left thyroid lobe nodules.  She is scheduled for a biopsy.  Her most recent TFTs were unremarkable.  Her TSH was checked just last month.  ??  03/07/16 Thyroid Ultrasound: Suspicious nodules in the left thyroid lobe for which FNA sampling is recommended for histologic diagnosis.  ??  2. CHF (Systolic) and HTN: Present for several years.  She is followed by the cardiology team and has an appointment scheduled with them later this month.  Earlier this year, her Lasix dose was decreased from 40 mg daily to 20 mg daily.  The patient returned back to clinic to monitor her weight.  She was also instructed to notify us if she develops any weight gain of 3 pounds in 24 hours or 5 pounds in 1 week.  Note that she has an AICD  in her last known EF was noted to be 20% per her history.  Her last echocardiogram per our EHR is from 2016.  Earlier this month, she presented to the emergency department with complaint of chest pain.  A BMP was obtained and elevated.   there were plans to admit the patient for congestive heart failure exacerbation.  Due to no available hospital beds, the patient continued to receive care while in the emergency department  for several hours.  Upon receiving this treatment, she improved clinically.  The patient agreed to follow-up with her cardiology team for a follow-up echocardiogram.  Since hospital discharge, the patient reports no chest pain.  She has some dyspnea on exertion.  She has some dizziness but this is chronic and likely secondary to low blood pressure.  Her blood pressure today is 94/46.  Her heart rate is 46.    Wt Readings from Last 3 Encounters:   04/07/16 196 lb (88.9 kg)   04/02/16 198 lb (89.8 kg)   03/20/16 201 lb (91.2 kg)     3. LLE Pain and HLD: LLE pain has been present off/on for 3 yrs. She was told it was from taking statin rx (for HLD) and her ACE (for CHF and HTN).  Pain along the left lower extremity is mostly along the lateral aspect of her patella joint and calf area.  No alleviating factors are known.  Although the patient has had PVL studies to rule out DVT (in 2014), the patient has not had ABIs to rule out peripheral vascular disease.    4.  Abdominal discomfort: Since her emergency department/hospital discharge, the patient reports lower periumbilical pain only present when pushing up on this area.  No aggravating factors are known.  No triggers are known.  Symptoms are off and on.  She has never had symptoms like this before.  No nausea or vomiting.  No fever or chills.  No dysuria, hematuria, hematochezia, or melena.  Note the patient has a history of a hysterectomy and a cholecystectomy.      Patient Active Problem List    Diagnosis Date Noted   ??? Age-related osteoporosis without current pathological fracture 03/04/2016   ??? Multinodular goiter 03/04/2016   ??? History of meningioma 03/04/2016   ??? Non-rheumatic mitral regurgitation 04/25/2014   ??? AR (allergic rhinitis) 09/29/2011   ??? Chronic systolic heart failure ([place])    ??? Automatic implantable cardioverter-defibrillator in situ    ??? Mixed hyperlipidemia    ??? AICD (automatic cardioverter/defibrillator) present 11/15/2010    ??? Cataract 12/01/2008   ??? Gallstones 11/13/2008   ??? Insomnia 11/13/2008   ??? OSA (obstructive sleep apnea) 11/13/2008   ??? Fibroid 06/23/2008   ??? HTN (hypertension) 06/23/2008       Current Outpatient Prescriptions   Medication Sig Dispense Refill   ??? lisinopril (PRINIVIL, ZESTRIL) 5 mg tablet Take 1 Tab by mouth two (2) times a day. 180 Tab 1   ??? carvedilol (COREG) 3.125 mg tablet Take 1 Tab by mouth two (2) times a day. 180 Tab 1   ??? furosemide (LASIX) 20 mg tablet Take 1 Tab by mouth daily. 90 Tab 3   ??? CRESTOR 5 mg tablet TAKE ONE TABLET BY MOUTH EVERY EVENING 90 Tab 3   ??? calcium-vitamin D (CALCIUM 600 + D,3,) 600 mg(1,500mg ) -200 unit tab Take 1 Tab by mouth two (2) times daily (with meals).     ??? albuterol (PROVENTIL HFA, VENTOLIN HFA, PROAIR HFA) 90 mcg/actuation inhaler Take 2 Puffs by inhalation every four (4) hours as needed for Wheezing or Shortness of Breath. Indications: BRONCHOSPASM PREVENTION 1 Inhaler 0   ??? fluticasone (FLONASE) 50 mcg/actuation nasal spray 2 Sprays by Both Nostrils route daily. (Patient taking differently: 2 Sprays by Both Nostrils route daily. As needed) 1 Bottle 5       Allergies   Allergen Reactions   ??? Tramadol Other (comments)     Psychotic Hallucinations   ??? Penicillamine Itching   ??? Pcn [Penicillins] Itching     Itching bumps on hands and arms       Past Medical History:   Diagnosis Date   ??? Automatic implantable cardiac defibrillator in situ     Post ddd icd Set up carelink   ??? Breast mass, left     benign   ??? CHF (congestive heart failure) ([place]) 06/23/2008    Non-ischemic CMP , Cath (2008) Normal coronary arteries   ??? Chronic systolic heart failure ([facility])     Stable,    ??? Degenerative arthritis of left knee    ??? Fibroid 06/23/2008   ??? HLD (hyperlipidemia)    ??? HTN (hypertension) 06/23/2008   ??? Hypotension, unspecified     Related to lisinopril   ??? 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 Left knee pain     With swelling   ??? Leg pain     ??? 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 OSA (obstructive sleep apnea) 9/10   ??? Venous reflux    ??? Wears glasses        Past Surgical History:   Procedure Laterality Date   ??? HX BREAST BIOPSY      rt breast, benign   ??? HX CHOLECYSTECTOMY     ??? HX CRANIOTOMY  2013    meningioma    ??? HX HEENT  glasses   ??? HX PACEMAKER      dual chamber icd   ??? HX TONSIL AND ADENOIDECTOMY     ??? HX TOTAL ABDOMINAL HYSTERECTOMY  1996   ??? HX TUBAL LIGATION         Family History   Problem Relation Age of Onset   ??? Diabetes Mother    ??? Heart Attack Father      MI   ??? Heart Disease Maternal Grandmother    ??? Heart Disease Paternal Grandmother    ??? Kidney Disease Other      1 sib deceased   ??? Cancer Other      1 sib throat cancer   ??? Diabetes Other    ??? Diabetes Daughter    ??? Other Daughter      leukemia   ??? Hypertension Other        Social History   Substance Use Topics   ??? Smoking status: Never Smoker   ??? Smokeless tobacco: Never Used   ??? Alcohol use No       ROS   Review of Systems   Constitutional: Positive for malaise/fatigue. Negative for chills and fever.   HENT: Negative for ear pain and sore throat.    Eyes: Negative for blurred vision and pain.   Respiratory: Positive for shortness of breath (+DOE). Negative for cough.    Cardiovascular: Negative for chest pain (none today).   Gastrointestinal: Positive for abdominal pain (see HPI). Negative for blood in stool and melena.   Genitourinary: Negative for dysuria and hematuria.   Musculoskeletal: Positive for myalgias. Negative for joint pain.   Skin: Negative for rash.   Neurological: Negative for tingling, focal weakness and headaches.   Endo/Heme/Allergies: Does not bruise/bleed easily.   Psychiatric/Behavioral: Negative for substance abuse.       Objective     Vitals:    04/07/16 1148   BP: 94/46   Pulse: (!) 46   Resp: 14   Temp: 97.1 ??F (36.2 ??C)   TempSrc: Oral   SpO2: 97%   Weight: 196 lb (88.9 kg)   Height: 5\' 11"  (1.803 m)   PainSc:   5    PainLoc: Leg       Physical Exam   Constitutional: She is oriented to person, place, and time and well-developed, well-nourished, and in no distress.   HENT:   Head: Normocephalic and atraumatic.   Right Ear: External ear normal.   Left Ear: External ear normal.   Nose: Nose normal.   Mouth/Throat: Oropharynx is clear and moist. No oropharyngeal exudate.   Eyes: Conjunctivae and EOM are normal. Pupils are equal, round, and reactive to light. Right eye exhibits no discharge. Left eye exhibits no discharge. No scleral icterus.   Neck: Neck supple.   Cardiovascular: Normal rate, regular rhythm, normal heart sounds and intact distal pulses.  Exam reveals no gallop and no friction rub.    No murmur heard.  Pulmonary/Chest: Effort normal and breath sounds normal. No respiratory distress. She has no wheezes. She has no rales.   Abdominal: Soft. Bowel sounds are normal. She exhibits no distension. There is tenderness (TTP just inferior to her peiumbilicus - along an abdominal surgical scar). There is no rebound and no guarding.   No hepatomegaly. No abdominal hernia is appreciated on exam.   Musculoskeletal: She exhibits no edema or tenderness (BUE).   BLE are NTTP except along her left lateral patella jt and upper lateral calf area   Lymphadenopathy:  She has no cervical adenopathy.   Neurological: She is alert and oriented to person, place, and time. She exhibits normal muscle tone. Gait normal.   Skin: Skin is warm and dry. No erythema.   Psychiatric: Affect normal.   Nursing note and vitals reviewed.      LABS   Data Review:   Lab Results   Component Value Date/Time    WBC 5.6 04/03/2016 12:25 PM    Hemoglobin, POC 12.2 01/11/2016 11:28 AM    HGB 11.0 (L) 04/03/2016 12:25 PM    Hematocrit, POC 36 01/11/2016 11:28 AM    HCT 34.4 (L) 04/03/2016 12:25 PM    PLATELET 194 04/03/2016 12:25 PM    MCV 94.0 04/03/2016 12:25 PM       Lab Results   Component Value Date/Time    Sodium 146 (H) 04/03/2016 12:25 PM     Potassium 4.1 04/03/2016 12:25 PM    Chloride 108 04/03/2016 12:25 PM    CO2 28 04/03/2016 12:25 PM    Anion gap 10 04/03/2016 12:25 PM    Glucose 116 (H) 04/03/2016 12:25 PM    BUN 12 04/03/2016 12:25 PM    Creatinine 0.69 04/03/2016 12:25 PM    BUN/Creatinine ratio 17 04/03/2016 12:25 PM    GFR est AA >60 04/03/2016 12:25 PM    GFR est non-AA >60 04/03/2016 12:25 PM    Calcium 8.6 04/03/2016 12:25 PM       Lab Results   Component Value Date/Time    Cholesterol, total 161 04/09/2015 08:11 AM    HDL Cholesterol 73 (H) 04/09/2015 08:11 AM    LDL, calculated 79.8 04/09/2015 08:11 AM    VLDL, calculated 8.2 04/09/2015 08:11 AM    Triglyceride 41 04/09/2015 08:11 AM    CHOL/HDL Ratio 2.2 04/09/2015 08:11 AM       Lab Results   Component Value Date/Time    Hemoglobin A1c 5.8 12/09/2012 10:50 AM       Assessment/Plan:   1. Left leg pain: Etiology is unknown. Previous PVL study is negative for DVT. Pain is off/on.  ???Her most recent labs do not show any electrolyte abnormalities.  Must rule out underlying PE ED.  Ordering ABIs.    ORDERS:  - ANKLE BRACHIAL INDEX; Future    2. Periumbilical abdominal pain: No hernia was appreciated on PE today. Her most recent labs were reassuring. No triggers are known.   ???Ordering an abdominal ultrasound to further investigate her symptoms.    ORDERS:  - [place] ABD COMP; Future    3. Chronic systolic heart failure HTN, and AICD placement: Stable. BP is low but she is on appropriate CHF rx. +H/o mitral regurgitation.   ???We discussed how her CHF would likely need continued medication management.  I instructed her to check her weight on a daily basis and to notify our team and her cardiology team in the event that she gains 3 pounds in 24 ounces or 5 pounds in 1 week.  I also discussed how she may need to give herself extra Lasix in the event that she gains excess weight per instructions for our team or her cardiology team.  ???Continue with all medications for now pending her cardiology follow-up  appointment.    4. Mixed hyperlipidemia: Stable.   ???Continue with medication/statin.  We discussed the potential side effects of statin therapy.  I do not believe that her leg cramps are secondary to taking the statin.  Will proceed with the plan of care  discussed in #1.    5. Multinodular goiter:   ???I encouraged the patient to get the scheduled biopsy of her left thyroid gland.        Health Maintenance Due   Topic Date Due   ??? DTaP/Tdap/Td series (1 - Tdap) 04/12/1971   ??? ZOSTER VACCINE AGE 17>  02/08/2010   ??? GLAUCOMA SCREENING Q2Y  04/12/2015     Lab review: labs are reviewed in the EHR    I have discussed the diagnosis with the patient and the intended plan as seen in the above orders.  The patient has received an after-visit summary and questions were answered concerning future plans.  I have discussed medication side effects and warnings with the patient as well. I have reviewed the plan of care with the patient, accepted their input and they are in agreement with the treatment goals. All questions were answered. The patient understands the plan of care. Handouts provided today with above information. Pt instructed if symptoms worsen to call the office or report to the ED for continued care.  Greater than 50% of the visit time was spent in counseling and/or coordination of care.        Follow-up Disposition:  Return if symptoms worsen or fail to improve.    Leo Rod, MD

## 2016-04-09 ENCOUNTER — Inpatient Hospital Stay: Admit: 2016-04-09 | Payer: MEDICARE | Attending: Otolaryngology | Primary: Family Medicine

## 2016-04-09 DIAGNOSIS — E041 Nontoxic single thyroid nodule: Secondary | ICD-10-CM

## 2016-04-15 ENCOUNTER — Ambulatory Visit: Admit: 2016-04-15 | Discharge: 2016-04-15 | Payer: MEDICARE | Attending: Specialist | Primary: Family Medicine

## 2016-04-15 DIAGNOSIS — I5022 Chronic systolic (congestive) heart failure: Secondary | ICD-10-CM

## 2016-04-15 NOTE — Progress Notes (Signed)
HISTORY OF PRESENT ILLNESS  Cathy [name] is a 66 y.o. female.    HPI Comments: Patient with chf,cmp,mr syncope  Happened while eating at the restaurant-icd eval-no significant arrythmia  No recurrence  03/2016-er visit with atypical pains-records reviewed      Chest Pain (Angina)    Associated symptoms include shortness of breath. Pertinent negatives include no abdominal pain, no claudication, no cough, no dizziness, no fever, no headaches, no hemoptysis, no nausea, no orthopnea, no palpitations, no PND, no sputum production, no vomiting and no weakness.   Cardiomyopathy   The history is provided by the patient. This is a chronic problem. The problem occurs constantly. The problem has not changed since onset.Associated symptoms include chest pain and shortness of breath. Pertinent negatives include no abdominal pain and no headaches.   Hypertension   The history is provided by the patient. This is a chronic problem. The problem occurs constantly. Associated symptoms include chest pain and shortness of breath. Pertinent negatives include no abdominal pain and no headaches.   CHF   The history is provided by the patient. This is a chronic problem. The problem occurs constantly. The problem has not changed since onset.Associated symptoms include chest pain and shortness of breath. Pertinent negatives include no abdominal pain and no headaches. The symptoms are aggravated by exertion.   Shortness of Breath   The history is provided by the patient. This is a recurrent problem. The problem occurs frequently.The problem has not changed since onset.Associated symptoms include chest pain. Pertinent negatives include no fever, no headaches, no cough, no sputum production, no hemoptysis, no wheezing, no PND, no orthopnea, no vomiting, no abdominal pain, no rash, no leg swelling and no claudication.       Review of Systems   Constitutional: Negative for chills and fever.   HENT: Negative for nosebleeds.     Eyes: Negative for blurred vision and double vision.   Respiratory: Positive for shortness of breath. Negative for cough, hemoptysis, sputum production and wheezing.    Cardiovascular: Positive for chest pain. Negative for palpitations, orthopnea, claudication, leg swelling and PND.   Gastrointestinal: Negative for abdominal pain, heartburn, nausea and vomiting.   Musculoskeletal: Negative for myalgias.   Skin: Negative for rash.   Neurological: Negative for dizziness, loss of consciousness, weakness and headaches.   Endo/Heme/Allergies: Does not bruise/bleed easily.     Family History   Problem Relation Age of Onset   ??? Diabetes Mother    ??? Heart Attack Father      MI   ??? Heart Disease Maternal Grandmother    ??? Heart Disease Paternal Grandmother    ??? Kidney Disease Other      1 sib deceased   ??? Cancer Other      1 sib throat cancer   ??? Diabetes Other    ??? Diabetes Daughter    ??? Other Daughter      leukemia   ??? Hypertension Other        Past Medical History:   Diagnosis Date   ??? Automatic implantable cardiac defibrillator in situ     Post ddd icd Set up [facility]   ??? Breast mass, left     benign   ??? CHF (congestive heart failure) ([place]) 06/23/2008    Non-ischemic CMP , Cath (2008) Normal coronary arteries   ??? Chronic systolic heart failure (HCC)     Stable,    ??? Degenerative arthritis of left knee    ??? Fibroid 06/23/2008   ???  HLD (hyperlipidemia)    ??? HTN (hypertension) 06/23/2008   ??? Hypotension, unspecified     Related to lisinopril   ??? 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 Left knee pain     With swelling   ??? Leg pain    ??? 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 OSA (obstructive sleep apnea) 9/10   ??? Venous reflux    ??? Wears glasses        Past Surgical History:   Procedure Laterality Date   ??? HX BREAST BIOPSY      rt breast, benign   ??? HX CHOLECYSTECTOMY     ??? HX CRANIOTOMY  2013    meningioma    ??? HX HEENT      glasses   ??? HX PACEMAKER       dual chamber icd   ??? HX TONSIL AND ADENOIDECTOMY     ??? HX TOTAL ABDOMINAL HYSTERECTOMY  1996   ??? HX TUBAL LIGATION         Allergies   Allergen Reactions   ??? Tramadol Other (comments)     Psychotic Hallucinations   ??? Penicillamine Itching   ??? Pcn [Penicillins] Itching     Itching bumps on hands and arms       Current Outpatient Prescriptions   Medication Sig   ??? lisinopril (PRINIVIL, ZESTRIL) 5 mg tablet Take 1 Tab by mouth two (2) times a day.   ??? carvedilol (COREG) 3.125 mg tablet Take 1 Tab by mouth two (2) times a day.   ??? furosemide (LASIX) 20 mg tablet Take 1 Tab by mouth daily.   ??? CRESTOR 5 mg tablet TAKE ONE TABLET BY MOUTH EVERY EVENING   ??? calcium-vitamin D (CALCIUM 600 + D,3,) 600 mg(1,500mg ) -200 unit tab Take 1 Tab by mouth two (2) times daily (with meals).   ??? albuterol (PROVENTIL HFA, VENTOLIN HFA, PROAIR HFA) 90 mcg/actuation inhaler Take 2 Puffs by inhalation every four (4) hours as needed for Wheezing or Shortness of Breath. Indications: BRONCHOSPASM PREVENTION   ??? fluticasone (FLONASE) 50 mcg/actuation nasal spray 2 Sprays by Both Nostrils route daily. (Patient taking differently: 2 Sprays by Both Nostrils route daily. As needed)     No current facility-administered medications for this visit.        Visit Vitals   ??? BP 109/59   ??? Pulse (!) 46   ??? Ht 5\' 11"  (1.803 m)   ??? Wt 84.8 kg (187 lb)   ??? BMI 26.08 kg/m2         Physical Exam   Constitutional: She is oriented to person, place, and time. She appears well-developed and well-nourished.   HENT:   Head: Normocephalic and atraumatic.   Eyes: Conjunctivae are normal.   Neck: Neck supple. No JVD present. No tracheal deviation present. No thyromegaly present.   Cardiovascular: Normal rate and regular rhythm.  PMI is not displaced.  Exam reveals no gallop, no S3 and no decreased pulses.    Murmur heard.   Holosystolic murmur is present with a grade of 2/6  at the lower left sternal border, apex   Pulmonary/Chest: No respiratory distress. She has no wheezes. She has no rales. She exhibits no tenderness.   Abdominal: Soft. There is no tenderness.   Musculoskeletal: She exhibits no edema.   Neurological: She is alert and oriented to person, place, and time.  Skin: Skin is warm.   Psychiatric: She has a normal mood and affect.     CARDIOLOGY STUDIES 08/25/2010 10/25/2008 07/25/2008 01/25/2008 02/24/2006   Myocardial Perfusion Scan Result - - FIXED INF AND REVERSIBLE ANT APICAL DEFECT - -   Cardiac Cath Result - - - - EF 25%, NO SIGNIFICANT CAD   Echocardiogram - Complete Result DILATED 20%, MILD MR - - EF 25% -   Doppler US Arterial Result - NL SCAN - - -   Some recent data might be hidden           Ms. Heit has a reminder for a "due or due soon" health maintenance. I have asked that she contact her primary care provider for follow-up on this health maintenance.    SUMMARY:echo:01/2013  Left ventricle: The ventricle was moderately to severely dilated. Systolic  function was moderately to markedly reduced. Ejection fraction was  estimated in the range of 20 % to 25 %. There was severe diffuse  hypokinesis. Features were consistent with a pseudonormal left ventricular  filling pattern, with concomitant abnormal relaxation and increased  filling pressure (grade 2 diastolic dysfunction). Intracavitary echoes  most suggestive of apical trabeculations were present.    Left atrium: The atrium was severely dilated. LA volume index was 91  ml/m squared.    Right atrium: The atrium was mildly dilated.    Mitral valve: There was mild annular calcification. There was mild diffuse  thickening of the anterior and posterior leaflets. In certain images the  leaflet tips exhibit minimally reduced coaptation. There was moderate to  severe regurgitation. Mean transmitral gradient was 2.7 mmHg. The  effective orifice of mitral regurgitation by proximal isovelocity surface   area was 0.22 cm squared. The volume of mitral regurgitation by proximal  isovelocity surface area was 39 ml.    Tricuspid valve: There was mild regurgitation. Pulmonary artery systolic  pressure: 56 mmHg.   SUMMARY:echo-08/2014  Left ventricle: The ventricle was moderately dilated. Systolic function  was severely reduced. Ejection fraction was estimated to be 20 %. There  was severe diffuse hypokinesis. Doppler parameters were consistent with  abnormal left ventricular relaxation (grade 1 diastolic dysfunction).    Left atrium: The atrium was severely dilated.    Atrial septum: There was no evidence of intracardiac shunt by  peripherally-administered agitated saline contrast.    Right atrium: There was a possible, small, mobile mass associated with the  pacing wire.    Mitral valve: There was mild annular calcification. There was mild diffuse  thickening of the anterior and posterior leaflets. There was moderate  regurgitation. The effective orifice of mitral regurgitation by proximal  isovelocity surface area was 0.1 cm??????. The volume of mitral regurgitation  by proximal isovelocity surface area was 27 ml.    Tricuspid valve: There was mild regurgitation. Pulmonary artery systolic  pressure: 30 mmHg.    Study Conclusions-echo-06/2015    - Left ventricle: The cavity size was severely dilated. Wall  ????thickness was normal. Systolic function was severely reduced. The  ????estimated ejection fraction was 15%. Diffuse hypokinesis. Doppler  ????parameters are consistent with abnormal left ventricular  ????relaxation (grade 1 diastolic dysfunction). Doppler parameters  ????are consistent with high ventricular filling pressure.  - Mitral valve: There was moderate regurgitation.  - Left atrium: The atrium was severely dilated.    Impressions:    - Severe global reduction in LV function; severe LVE; grade 1  ????diastolic dysfunction with elevated LV filling pressure; severe  ????LAE; moderate MR;  mild TR.    Assessment        ICD-10-CM ICD-9-CM    1. Chronic systolic heart failure (HCC) I50.22 428.22 2D ECHO COMPLETE ADULT (TTE)    stable  nyha class3   2. Non-rheumatic mitral regurgitation I34.0 424.0     mr  monitor   3. Essential hypertension I10 401.9     controlled   4. Mixed hyperlipidemia E78.2 272.2     lab with pcp   5. AICD (automatic cardioverter/defibrillator) present Z95.810 V45.02     normal function   6. Chest pain, unspecified type R07.9 786.50     atypical muscular pains  had er visit  monitor     On low dose coreg and lisinopril as Patient had hypotension in past with higher dose  Does not want lvad/transplant evaluation  There are no discontinued medications.    Orders Placed This Encounter   ??? 2D ECHO COMPLETE ADULT (TTE)     Standing Status:   Future     Standing Expiration Date:   10/12/2016     Order Specific Question:   Reason for Exam:     Answer:   see diagnosis       Follow-up Disposition:  Return in about 4 weeks (around 05/13/2016).

## 2016-04-15 NOTE — Progress Notes (Signed)
1. Have you been to the ER, urgent care clinic since your last visit?  Hospitalized since your last visit?  Yes       2. Have you seen or consulted any other health care providers outside of the [facility] since your last visit?  Include any pap smears or colon screening.      No     3.  Since your last visit, have you had any of the following symptoms?      chest pains and shortness of breath.

## 2016-04-17 ENCOUNTER — Inpatient Hospital Stay: Payer: MEDICARE | Attending: Family Medicine | Primary: Family Medicine

## 2016-04-17 ENCOUNTER — Ambulatory Visit: Payer: MEDICARE | Primary: Family Medicine

## 2016-04-18 ENCOUNTER — Encounter

## 2016-04-18 NOTE — Telephone Encounter (Signed)
Referral faxed over to [facility] cardiology today they will call patient to schedule appointment

## 2016-04-18 NOTE — Telephone Encounter (Signed)
Pt called, said you had recommended a cardiologist group in [place] area for her to see and she can't remember the physician's name to call and make an appt.  I didn't find a name in your note.  Please call patient at [phone] and advise.

## 2016-04-22 ENCOUNTER — Inpatient Hospital Stay: Admit: 2016-04-22 | Payer: MEDICARE | Attending: Family Medicine | Primary: Family Medicine

## 2016-04-22 DIAGNOSIS — M79605 Pain in left leg: Secondary | ICD-10-CM

## 2016-04-23 ENCOUNTER — Encounter

## 2016-04-23 NOTE — Progress Notes (Signed)
Result sent via my chart

## 2016-04-23 NOTE — Progress Notes (Signed)
Please let the pt know that her abdominal ultrasound results are reassuring. There are no new or significant abnormalities.     Dr. Marius Ditch  [facility], [place] [address]  Phone: [phone]  Fax: [phone]

## 2016-04-23 NOTE — Procedures (Signed)
[facility]  *** FINAL REPORT ***    Name: Cathy [name], Cathy [name]  MRN: [id number]    Outpatient  DOB: Apr 28, 1950  HIS Order #: [id number]  [place] Visit #: [id number]  Date: 22 Apr 2016    TYPE OF TEST: Peripheral Arterial Testing    REASON FOR TEST  Limb pain, Hyperlipidemia, NOS, Hypertension, unspecified    Right Leg  Doppler:    Normal  Ankle/Brachial: 1.17    Left Leg  Doppler:    Normal  Ankle/Brachial: 1.08    INTERPRETATION/FINDINGS  Physiologic testing was performed using continuous wave Doppler and  segmental pressures.  1. No evidence of significant peripheral arterial disease at rest in  the right leg.  2. No evidence of significant peripheral arterial disease at rest in  the left leg.  3. The right ankle/brachial index is 1.17 and the left ankle/brachial  index is 1.08.    ADDITIONAL COMMENTS    I have personally reviewed the data relevant to the interpretation of  this  study.    TECHNOLOGIST: Ilean Skill, RCIS, RVS  Signed: 04/22/2016 03:23 PM    PHYSICIAN: Baldemar Lenis. Janalyn Harder, MD  Signed: 04/23/2016 01:27 PM

## 2016-04-23 NOTE — Procedures (Signed)
[facility]  *** FINAL REPORT ***    Name: Cathy [name], Cathy [name]  MRN: [id number]    Outpatient  DOB: May 30, 1950  HIS Order #: [id number]  [place] Visit #: [id number]  Date: 22 Apr 2016    TYPE OF TEST: Peripheral Arterial Testing    REASON FOR TEST  Limb pain, Hyperlipidemia, NOS, Hypertension, unspecified    Right Leg  Doppler:    Normal  Ankle/Brachial: 1.17    Left Leg  Doppler:    Normal  Ankle/Brachial: 1.08    INTERPRETATION/FINDINGS  Physiologic testing was performed using continuous wave Doppler and  segmental pressures.  1. No evidence of significant peripheral arterial disease at rest in  the right leg.  2. No evidence of significant peripheral arterial disease at rest in  the left leg.  3. The right ankle/brachial index is 1.17 and the left ankle/brachial  index is 1.08.    ADDITIONAL COMMENTS    I have personally reviewed the data relevant to the interpretation of  this  study.    TECHNOLOGIST: Ilean Skill, RCIS, RVS  Signed: 04/22/2016 03:23 PM    PHYSICIAN: Baldemar Lenis. Janalyn Harder, MD  Signed: 04/23/2016 01:27 PM

## 2016-04-25 ENCOUNTER — Encounter

## 2016-04-25 MED ORDER — LISINOPRIL 5 MG TAB
5 mg | ORAL_TABLET | Freq: Two times a day (BID) | ORAL | 1 refills | Status: DC
Start: 2016-04-25 — End: 2017-04-17

## 2016-04-25 NOTE — Telephone Encounter (Signed)
-----   Message from Harrison. Lehmkuhl sent at 04/25/2016  8:20 AM EST -----  Regarding: Prescription Question  Contact: [phone]  I need a refill on Lisinopril 5 mg please   [place], [place], [place]  [phone]  Thank you  Cathy [name]  03-07-1950

## 2016-04-29 ENCOUNTER — Encounter: Primary: Family Medicine

## 2016-05-05 ENCOUNTER — Encounter: Attending: Specialist | Primary: Family Medicine

## 2016-05-09 ENCOUNTER — Encounter: Attending: Specialist | Primary: Family Medicine

## 2016-05-09 ENCOUNTER — Encounter

## 2016-05-09 MED ORDER — BENZONATATE 100 MG CAP
100 mg | ORAL_CAPSULE | Freq: Three times a day (TID) | ORAL | 3 refills | Status: DC | PRN
Start: 2016-05-09 — End: 2016-07-16

## 2016-07-04 MED ORDER — DENOSUMAB 60 MG/ML SUB-Q SYRINGE
60 mg/mL | Freq: Once | SUBCUTANEOUS | Status: AC
Start: 2016-07-04 — End: 2016-07-11

## 2016-07-04 MED FILL — PROLIA 60 MG/ML SUBCUTANEOUS SYRINGE: 60 mg/mL | SUBCUTANEOUS | Qty: 1

## 2016-07-10 NOTE — Telephone Encounter (Signed)
Patient stating her former PCP Dr Geoffry Paradise had her going to the infusion center downstairs every 6 months for vitamin d shots. She had an appt with them for tomorrow but they cancelled it because they didn't have approval from [place]. I'm assuming they need a referral from [place] since Dr. Cornelia Copa is her new PCP.

## 2016-07-11 NOTE — Telephone Encounter (Signed)
Ok to given 60mg  subcutaneous prolia injection once every 6 months. Please send this message to the [place].    Dr. Marius Ditch  [facility], [place] [address]  Phone: [phone]  Fax: [phone]

## 2016-07-11 NOTE — Telephone Encounter (Signed)
Patient is calling again.

## 2016-07-11 NOTE — Telephone Encounter (Addendum)
Chief Complaint   Patient presents with   ??? New Order     [facility] is in need of a New Order for the patient to receive her Prolia 60 mg injection once every 6 months     [facility] reached at [phone] and states they need a new order from Dr Cornelia Copa now that we are the patient's new PCP, and they are faxing over to [place] today the required order to fill out, and fax back to them, they will then set up an appointment with the patient to have her come in for her Prolia injection that she was due for today 07-11-16, but they canceled her appointment, due to needing the order renewed.   Our office received the Infusion Service Order and it has been given to Dr Cornelia Copa to complete and then fax back.

## 2016-07-12 ENCOUNTER — Inpatient Hospital Stay: Payer: MEDICARE | Primary: Family Medicine

## 2016-07-16 ENCOUNTER — Inpatient Hospital Stay
Admit: 2016-07-16 | Discharge: 2016-07-17 | Disposition: A | Discharge status: Home / Self Care | Payer: MEDICARE | Attending: Emergency Medicine

## 2016-07-16 ENCOUNTER — Emergency Department: Payer: MEDICARE | Primary: Family Medicine

## 2016-07-16 ENCOUNTER — Emergency Department: Admit: 2016-07-16 | Payer: MEDICARE | Primary: Family Medicine

## 2016-07-16 DIAGNOSIS — S20211A Contusion of right front wall of thorax, initial encounter: Secondary | ICD-10-CM
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
## 2016-07-16 NOTE — ED Notes (Signed)
I have reviewed discharge instructions with the patient.  The patient verbalized understanding.    Medication teaching given, to include name, dose, action, and side effects. Patient verbalized understanding of medications. Encouraged patient to voice any concerns with reassurance provided.      Patient armband removed and shredded    Patient Discharged in stable condition.
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
## 2016-07-17 ENCOUNTER — Ambulatory Visit: Admit: 2016-07-17 | Discharge: 2016-07-17 | Payer: MEDICARE | Attending: Family Medicine | Primary: Family Medicine

## 2016-07-17 LAB — METABOLIC PANEL, BASIC
Anion gap: 5 mmol/L (ref 3.0–18)
BUN/Creatinine ratio: 24 — ABNORMAL HIGH (ref 12–20)
BUN: 21 MG/DL — ABNORMAL HIGH (ref 7.0–18)
CO2: 32 mmol/L (ref 21–32)
Calcium: 8.9 MG/DL (ref 8.5–10.1)
Chloride: 103 mmol/L (ref 100–108)
Creatinine: 0.88 MG/DL (ref 0.6–1.3)
GFR est AA: >60 mL/min/{1.73_m2} (ref >60)
GFR est non-AA: >60 mL/min/{1.73_m2} (ref >60)
Glucose: 101 mg/dL — ABNORMAL HIGH (ref 74–99)
Potassium: 5 mmol/L (ref 3.5–5.5)
Sodium: 140 mmol/L (ref 136–145)

## 2016-07-17 LAB — EKG, 12 LEAD, INITIAL
Atrial Rate: 86 {beats}/min
Calculated P Axis: 50 degrees
Calculated R Axis: 11 degrees
Calculated T Axis: 155 degrees
Diagnosis: NORMAL
P-R Interval: 188 ms
Q-T Interval: 404 ms
QRS Duration: 120 ms
QTC Calculation (Bezet): 483 ms
Ventricular Rate: 86 {beats}/min

## 2016-07-17 LAB — CBC WITH AUTOMATED DIFF
ABS. BASOPHILS: 0 10*3/uL (ref 0.0–0.06)
ABS. EOSINOPHILS: 0.1 10*3/uL (ref 0.0–0.4)
ABS. LYMPHOCYTES: 1.5 10*3/uL (ref 0.9–3.6)
ABS. MONOCYTES: 0.6 10*3/uL (ref 0.05–1.2)
ABS. NEUTROPHILS: 3.4 10*3/uL (ref 1.8–8.0)
BASOPHILS: 0 % (ref 0–2)
EOSINOPHILS: 1 % (ref 0–5)
HCT: 38 % (ref 35.0–45.0)
HGB: 11.9 g/dL — ABNORMAL LOW (ref 12.0–16.0)
LYMPHOCYTES: 27 % (ref 21–52)
MCH: 28.9 PG (ref 24.0–34.0)
MCHC: 31.3 g/dL (ref 31.0–37.0)
MCV: 92.2 FL (ref 74.0–97.0)
MONOCYTES: 10 % (ref 3–10)
MPV: 11.3 FL (ref 9.2–11.8)
NEUTROPHILS: 62 % (ref 40–73)
PLATELET: 219 10*3/uL (ref 135–420)
RBC: 4.12 M/uL — ABNORMAL LOW (ref 4.20–5.30)
RDW: 13.3 % (ref 11.6–14.5)
WBC: 5.6 10*3/uL (ref 4.6–13.2)

## 2016-07-17 LAB — TROPONIN I: Troponin-I, QT: <0.02 NG/ML (ref 0.00–0.06)

## 2016-07-17 LAB — EKG 12-LEAD
Atrial Rate: 86 {beats}/min
Diagnosis: NORMAL
P Axis: 50 degrees
P-R Interval: 188 ms
Q-T Interval: 404 ms
QRS Duration: 120 ms
QTc Calculation (Bazett): 483 ms
R Axis: 11 degrees
T Axis: 155 degrees
Ventricular Rate: 86 {beats}/min
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
## 2016-07-17 NOTE — Progress Notes (Signed)
Patient attended their post discharge follow up appointment with Dr. Mason as scheduled.
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
## 2016-07-17 NOTE — Patient Instructions (Addendum)
Medicare Part B Preventive Services Limitations Recommendation Scheduled   Bone Mass Measurement  (age 66 & older, biennial) Requires diagnosis related to osteoporosis or estrogen deficiency. Biennial benefit unless patient has history of long-term glucocorticoid tx or baseline is needed because initial test was by other method This should be done at age 43 and again if on osteoporosis medication at 2-3 year intervals.  Up to date   Cardiovascular Screening Blood Tests (every 5 years)  Total cholesterol, HDL, Triglycerides Order as a panel if possible We should check your cholesterol panel at least once every 5 years. Up to date   Colorectal Cancer Screening  -Fecal occult blood test (annual)  -Flexible sigmoidoscopy (5y)  -Screening colonoscopy (10y)  -Barium Enema  Due per your Gastroenterologist's recommendations.  Up to date   Counseling to Prevent Tobacco Use (up to 8 sessions per year)  - Counseling greater than 3 and up to 10 minutes  - Counseling greater than 10 minutes Patients must be asymptomatic of tobacco-related conditions to receive as preventive service Continue with smoking cessation    Diabetes Screening Tests (at least every 3 years, Medicare covers annually or at 34-month intervals for prediabetic patients)    Fasting blood sugar (FBS) or glucose tolerance test (GTT) Patient must be diagnosed with one of the following:  -Hypertension, Dyslipidemia, obesity, previous impaired FBS or GTT  ???Or any two of the following: overweight, FH of diabetes, age ?48, history of gestational diabetes, birth of baby weighing more than 9 pounds Should be done yearly Up to date   Diabetes Self-Management Training (DSMT) (no USPSTF recommendation) Requires referral by treating physician for patient with diabetes or renal disease. 10 hours of initial DSMT session of no less than 30 minutes each in a continuous 72-month period.  2 hours of follow-up DSMT in subsequent years. Not applicable Not applicable    Glaucoma Screening (no USPSTF recommendation) Diabetes mellitus, family history, African American, age 31 or over, Hispanic American, age 70 or over Continue with annual eye exams. Overdue per our records   Human Immunodeficiency Virus (HIV) Screening (annually for increased risk patients)  HIV-1 and HIV-2 by EIA, ELISA, rapid antibody test, or oral mucosa transudate Patient must be at increased risk for HIV infection per USPSTF guidelines or pregnant.  Tests covered annually for patients at increased risk.  Pregnant patients may receive up to 3 test during pregnancy. Not applicable Not applicable   Medical Nutrition Therapy (MNT) (for diabetes or renal disease not recommended schedule) Requires referral by treating physician for patient with diabetes or renal disease.  Can be provided in same year as diabetes self-management training (DSMT), and CMS recommends medical nutrition therapy take place after DSMT.  Up to 3 hours for initial year and 2 hours in subsequent years. Not applicable Not applicable   Shingles Vaccination A shingles vaccine is also recommended once in a lifetime after age 18 Vaccination recommended for shingles vaccination. Overdue   Seasonal Influenza Vaccination (annually)  Continue with yearly "flu" shot annually Flu season is over   Pneumococcal Vaccination (once after 65)  Please receive this vaccination at age 29. Overdue   Hepatitis B Vaccinations (if medium/high risk) Medium/high risk factors:  End-stage renal disease,  Hemophiliacs who received Factor VIII or IX concentrates, Clients of institutions for the mentally retarded, Persons who live in the same house as a HepB virus carrier, Homosexual men, Illicit injectable drug abusers. Not applicable Not applicable   Screening Mammography (biennial age 78-74) Annually (age 31  or over) You need a mammogram yearly to screen for breast cancer. Up to date   Screening Pap Tests and Pelvic Examination (up to age 11 and after 20 if  unknown history or abnormal study last 10 years) Every 24 months except high risk You need no Pap smear at this time. Not needed   Ultrasound Screening for Abdominal Aortic Aneurysm (AAA) (once) Patient must be referred through IPPE and not have had a screening for abdominal aortic aneurysm before under Medicare.  Limited to patients who meet one of the following criteria:  - Men who are 22-3 years old and have smoked more than 100 cigarettes in their lifetime.  -Anyone with a FH of AAA  -Anyone recommended for screening by USPSTF Not applicable           Costochondritis: Care Instructions  Your Care Instructions  You have chest pain because the cartilage of your rib cage is inflamed. This problem is called costochondritis. This type of chest wall pain may last from days to weeks. It is not a heart problem. Sometimes costochondritis occurs with a cold or the flu, and other times the exact cause is not known.  Follow-up care is a key part of your treatment and safety. Be sure to make and go to all appointments, and call your doctor if you are having problems. It's also a good idea to know your test results and keep a list of the medicines you take.  How can you care for yourself at home?  ?? Take medicines for pain and inflammation exactly as directed.  ?? If the doctor gave you a prescription medicine, take it as prescribed.  ?? If you are not taking a prescription pain medicine, ask your doctor if you can take an over-the-counter medicine.  ?? Do not take two or more pain medicines at the same time unless the doctor told you to. Many pain medicines have acetaminophen, which is Tylenol. Too much acetaminophen (Tylenol) can be harmful.  ?? It may help to use a warm compress or heating pad (set on low) on your chest. You can also try alternating heat and ice. Put ice or a cold pack on the area for 10 to 20 minutes at a time. Put a thin cloth between the ice and your skin.   ?? Avoid any activity that strains the chest area. As your pain gets better, you can slowly return to your normal activities.  ?? Do not use tape, an elastic bandage, a "rib belt," or anything else that restricts your chest wall motion.  When should you call for help?  Call 911 anytime you think you may need emergency care. For example, call if:  ? ?? You have new or different chest pain or pressure. This may occur with:  ?? Sweating.  ?? Shortness of breath.  ?? Nausea or vomiting.  ?? Pain that spreads from the chest to the neck, jaw, or one or both shoulders or arms.  ?? Dizziness or lightheadedness.  ?? A fast or uneven pulse.  After calling 911, chew 1 adult-strength aspirin. Wait for an ambulance. Do not try to drive yourself.   ? ?? You have severe trouble breathing.   ?Call your doctor now or seek immediate medical care if:  ? ?? You have a fever or cough.   ? ?? You have any trouble breathing.   ? ?? Your chest pain gets worse.   ?Watch closely for changes in your health, and be sure  to contact your doctor if:  ? ?? Your chest pain continues even though you are taking anti-inflammatory medicine.   ? ?? Your chest wall pain has not improved after 5 to 7 days.   Where can you learn more?  Go to [email or web address].  Enter [phone] in the search box to learn more about "Costochondritis: Care Instructions."  Current as of: May 14, 2015  Content Version: 11.4  ?? 2006-2017 [employer]. Care instructions adapted under license by [employer] (which disclaims liability or warranty for this information). If you have questions about a medical condition or this instruction, always ask your healthcare professional. [place] any warranty or liability for your use of this information.      Medicare Wellness Visit, Female    The best way to live healthy is to have a lifestyle where you eat a well-balanced diet, exercise regularly, limit alcohol use, and quit all  forms of tobacco/nicotine, if applicable.     Regular preventive services are another way to keep healthy. Preventive services (vaccines, screening tests, monitoring & exams) can help personalize your care plan, which helps you manage your own care. Screening tests can find health problems at the earliest stages, when they are easiest to treat.     [place] follows the current, evidence-based guidelines published by the [place] [employer] (USPSTF) when recommending preventive services for our patients. Because we follow these guidelines, sometimes recommendations change over time as research supports it. (For example, mammograms used to be recommended annually. Even though Medicare will still pay for an annual mammogram, the newer guidelines recommend a mammogram every two years for women of average risk.)    Of course, you and your provider may decide to screen more often for some diseases, based on your risk and co-morbidities (chronic disease you are already diagnosed with).     Preventive services for you include:    - Medicare offers their members a free annual wellness visit, which is time for you and your primary care provider to discuss and plan for your preventive service needs. Take advantage of this benefit every year!    -All people over age 65 should receive the recommended pneumonia vaccines. Current USPSTF guidelines recommend a series of two vaccines for the best pneumonia protection.     -All adults should have a yearly flu vaccine and a tetanus vaccine every 10 years. All adults age 85 years should receive a shingles vaccine once in their lifetime.      -A bone mass density test is recommended when a woman turns 65 to screen for osteoporosis. This test is only recommended once as a screening. Some providers will use this same test as a disease monitoring tool if you already have osteoporosis.     -All adults age 6-70 years who are overweight should have a diabetes screening test once every three years.     -Other screening tests & preventive services for persons with diabetes include: an eye exam to screen for diabetic retinopathy, a kidney function test, a foot exam, and stricter control over your cholesterol.     -Cardiovascular screening for adults with routine risk involves an electrocardiogram (ECG) at intervals determined by the provider.     -Colorectal cancer screenings should be done for adults age 49-75 years with normal risk. There are a number of acceptable methods of screening for this type of cancer. Each test has its own benefits and drawbacks. Discuss with your  provider what is most appropriate for you during your annual wellness visit. The different tests include: colonoscopy (considered the best screening method), a fecal occult blood test, a fecal DNA test, and sigmoidoscopy.    -Breast cancer screenings are recommended every other year for women of normal risk age 75-74 years.     -Cervical cancer screenings for women over age 12 are only recommended with certain risk factors.     -All adults born between [place] should be screened once for Hepatitis C.     Here is a list of your current Health Maintenance items (your personalized list of preventive services) with a due date:  Health Maintenance Due   Topic Date Due   ??? DTaP/Tdap/Td  (1 - Tdap) 04/12/1971   ??? Shingles Vaccine  02/08/2010   ??? Glaucoma Screening   04/12/2015   ??? Pneumococcal Vaccine (2 of 2 - PPSV23) 04/24/2016   ??? Annual Well Visit  05/07/2016
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
## 2016-07-17 NOTE — ACP (Advance Care Planning) (Signed)
Advance Care Planning  Advance Care Planning (ACP) Provider Conversation     Date of ACP Conversation: 07/17/16  Persons included in Conversation:  patient    Authorized Decision Maker (if patient is incapable of making informed decisions):   This person is:   Automotive engineer - see below      For Patients with Decision Making Capacity:   Values/Goals: Exploration of values, goals, and preferences if recovery is not expected, even with continued medical treatment in the event of:  Imminent death  Severe, permanent brain injury    Conversation Outcomes / Follow-Up Plan:   Recommended completion of Advance Directive form after review of ACP materials and conversation with prospective healthcare agent .  We discussed the content of the advance directive forms.  All questions were answered.  She wants her husband to be her power of attorney.  In her chart, we only have a home phone number for her husband.  We do not have a cell phone number.  I encouraged her to complete her advanced directives that we can scan her information into her EHR.  She was given a copy today.  She agreed to complete it and bring it back to clinic so that he can be scanned into the EHR.    Dr. Marius Ditch  [facility], [place] [address]  Phone: [phone]  Fax: [phone]
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
## 2016-07-17 NOTE — ACP (Advance Care Planning) (Signed)
Advance Care Planning  Advance Care Planning (ACP) Provider Conversation     Date of ACP Conversation: 07/17/16  Persons included in Conversation:  patient    Authorized Decision Maker (if patient is incapable of making informed decisions):   This person is:   Automotive engineer - see below      For Patients with Decision Making Capacity:   Values/Goals: Exploration of values, goals, and preferences if recovery is not expected, even with continued medical treatment in the event of:  Imminent death  Severe, permanent brain injury    Conversation Outcomes / Follow-Up Plan:   Recommended completion of Advance Directive form after review of ACP materials and conversation with prospective healthcare agent .  We discussed the content of the advance directive forms.  All questions were answered.  She wants her husband to be her power of attorney.  In her chart, we only have a home phone number for her husband.  We do not have a cell phone number.  I encouraged her to complete her advanced directives that we can scan her information into her EHR.  She was given a copy today.  She agreed to complete it and bring it back to clinic so that he can be scanned into the EHR.    Dr. Marius Ditch  [facility], [place] [address]  Phone: [phone]  Fax: [phone]

## 2016-07-22 MED ORDER — DENOSUMAB 60 MG/ML SUB-Q SYRINGE
60 mg/mL | Freq: Once | SUBCUTANEOUS | Status: AC
Start: 2016-07-22 — End: 2016-07-24
  Administered 2016-07-24: 18:00:00 via SUBCUTANEOUS

## 2016-07-22 MED FILL — PROLIA 60 MG/ML SUBCUTANEOUS SYRINGE: 60 mg/mL | SUBCUTANEOUS | Qty: 1

## 2016-07-22 NOTE — Telephone Encounter (Signed)
Cathy [name] needs the Number of doses for Prolia on the order-they need new order  Faxed to  Fax #  [phone]

## 2016-07-23 NOTE — Telephone Encounter (Signed)
[facility] [employer], says she saw Dr. Cornelia Copa last week and says the tylenol is not working. Wants to know if Dr. Cornelia Copa will call in a muscle relaxer?

## 2016-07-24 ENCOUNTER — Inpatient Hospital Stay: Admit: 2016-07-24 | Payer: MEDICARE | Primary: Family Medicine

## 2016-07-24 DIAGNOSIS — M810 Age-related osteoporosis without current pathological fracture: Secondary | ICD-10-CM

## 2016-07-24 LAB — POC CHEM8
Anion gap, POC: 17 (ref 10–20)
BUN, POC: 23 MG/DL — ABNORMAL HIGH (ref 7–18)
CO2, POC: 26 MMOL/L — ABNORMAL HIGH (ref 19–24)
Calcium, ionized (POC): 1.2 mmol/L (ref 1.12–1.32)
Chloride, POC: 101 MMOL/L (ref 100–108)
Creatinine, POC: 0.8 MG/DL (ref 0.6–1.3)
GFRAA, POC: >60 mL/min/{1.73_m2} (ref >60)
GFRNA, POC: >60 mL/min/{1.73_m2} (ref >60)
Glucose, POC: 85 MG/DL (ref 74–106)
Hematocrit, POC: 39 % (ref 36–49)
Hemoglobin, POC: 13.3 G/DL (ref 12–16)
Potassium, POC: 5.3 MMOL/L (ref 3.5–5.5)
Sodium, POC: 138 MMOL/L (ref 136–145)

## 2016-07-24 LAB — PHOSPHORUS: Phosphorus: 3.4 MG/DL (ref 2.5–4.9)

## 2016-07-24 LAB — MAGNESIUM: Magnesium: 2.4 mg/dL (ref 1.6–2.6)

## 2016-07-24 MED ORDER — CYCLOBENZAPRINE 5 MG TAB
5 mg | ORAL_TABLET | Freq: Three times a day (TID) | ORAL | 3 refills | Status: DC | PRN
Start: 2016-07-24 — End: 2017-01-01

## 2016-07-24 NOTE — Telephone Encounter (Signed)
Called and spoke to patient about the below message.  Patient verbalized understanding with no additional questions.

## 2016-07-24 NOTE — Progress Notes (Signed)
[facility] OPIC Progress Note    Date: Jul 24, 2016    Name: Cathy [name]    MRN: [id number]         DOB: 06/11/1950     Prolia injection      Ms. Herberg to [place], ambulatory at 1315. Pt was assessed and education was provided.   Patient has received Prolia injections in the past without adverse effects. She confirms she is taking oral calcium and Vit D supplements.     Ms. Digman's vitals were reviewed and patient was observed for 5 minutes prior to treatment.   Visit Vitals   ??? BP 100/60 (BP 1 Location: Left arm, BP Patient Position: Sitting)   ??? Pulse 86   ??? Temp 99.1 ??F (37.3 ??C)   ??? Resp 18   ??? Ht 5\' 10"  (1.778 m)   ??? Wt 81.6 kg (180 lb)   ??? SpO2 98%   ??? Breastfeeding No   ??? BMI 25.83 kg/m2       Blood obtained peripherally from right [facility] with butterfly needle without difficulty and sent to lab for Mg and Phos per written orders. BMP run on iSTAT. No bleeding or hematoma noted at site. Guaze and coban applied.    Lab results were obtained and reviewed.  Recent Results (from the past 12 hour(s))   POC CHEM8    Collection Time: 07/24/16  1:32 PM   Result Value Ref Range    CO2, POC 26 (H) 19 - 24 MMOL/L    Glucose, POC 85 74 - 106 MG/DL    BUN, POC 23 (H) 7 - 18 MG/DL    Creatinine, POC 0.8 0.6 - 1.3 MG/DL    GFRAA, POC >60 >60 ml/min/1.45m2    GFRNA, POC >60 >60 ml/min/1.61m2    Sodium, POC 138 136 - 145 MMOL/L    Potassium, POC 5.3 3.5 - 5.5 MMOL/L    Calcium, ionized (POC) 1.20 1.12 - 1.32 mmol/L    Chloride, POC 101 100 - 108 MMOL/L    Anion gap, POC 17 10 - 20      Hematocrit, POC 39 36 - 49 %    Hemoglobin, POC 13.3 12 - 16 G/DL     Ionized calcium results WNL at 1.20 and within ordered parameters to give Prolia today.     Prolia 60 mg was administered subcutaneous in  Back of left upper arm. No irritation or bleeding noted at site. Bandaid applied.     Ms. Sakuma tolerated well, and had no complaints.  Patient armband removed and shredded.     Ms. Leblond was discharged from [facility] in stable condition at 1345. She is to return on 01/22/17 at 1300 for her next Prolia appointment.    Damien Fusi, RN  Jul 24, 2016

## 2016-07-24 NOTE — Telephone Encounter (Signed)
-----   Message from Leo Rod, MD sent at 07/24/2016  1:40 PM EDT -----  Please let her know that her kidney function and potassium are normal.    Dr. Marius Ditch  [facility], [place] [address]  Phone: [phone]  Fax: [phone]

## 2016-07-24 NOTE — Progress Notes (Signed)
Please let her know that her kidney function and potassium are normal.    Dr. Marius Ditch  [facility], [place] [address]  Phone: [phone]  Fax: [phone]

## 2016-07-24 NOTE — Telephone Encounter (Signed)
Cyclobenzaprine muscle relaxant e prescribed.    Dr. Marius Ditch  [facility], [place] [address]  Phone: [phone]  Fax: [phone]

## 2016-07-31 NOTE — Telephone Encounter (Signed)
Chief Complaint   Patient presents with   ??? New Order     Prolia Injection at [facility] per Dr Cornelia Copa     Patient reached and states she did receive her Prolia Injection on 07-24-16 at the [facility] and will get another every 6 months.  All understood.

## 2016-09-17 ENCOUNTER — Inpatient Hospital Stay: Admit: 2016-09-17 | Payer: MEDICARE | Attending: Family Medicine | Primary: Family Medicine

## 2016-09-17 DIAGNOSIS — Z1231 Encounter for screening mammogram for malignant neoplasm of breast: Secondary | ICD-10-CM

## 2016-09-17 NOTE — Telephone Encounter (Addendum)
-----   Message from Leo Rod, MD sent at 09/17/2016 11:47 AM EDT -----  Please let her know that her mammogram shows no suspicious findings for breast cancer.    Dr. Marius Ditch  [facility], [place] [address]  Phone: [phone]  Fax: [phone]    Chief Complaint   Patient presents with   ??? Results     done 09-17-16 Mammogram 3D per Dr Cornelia Copa     Left voice message to return my call.

## 2016-09-17 NOTE — Progress Notes (Signed)
Please let her know that her mammogram shows no suspicious findings for breast cancer.    Dr. Nyajah Whittle Anne Philander Ake  [facility]  [address], [address]  [place] [address]  Phone: [phone]  Fax: [phone]

## 2016-09-17 NOTE — Telephone Encounter (Signed)
Pt notified

## 2016-09-18 NOTE — Progress Notes (Signed)
Patient notified via letter

## 2016-09-29 MED ORDER — CARVEDILOL 3.125 MG TAB
3.125 mg | ORAL_TABLET | ORAL | 0 refills | Status: DC
Start: 2016-09-29 — End: 2016-12-24

## 2016-10-20 NOTE — Telephone Encounter (Signed)
We do not process records request here, they are done by [employer] per [employer].   The release she signed was for her atty, Audie Clear to receive records.  As I explained earlier, she can come in and fill out a release for her to receive her records but the process will be the same, the request will be sent to [employer] for processing and it will take 2-3 business weeks to receive them if and when she pays the invoice sent to her by [employer] for the copying and mailing of those records.   Looks like she is active on MyChart so she should be able to see and print any office notes she needs if that will help her.

## 2016-10-20 NOTE — Telephone Encounter (Signed)
I relayed the message by leaving a voice mail at [phone]    I attempted to do the same at the # the patient requested to be called back [phone]; no voice mail & no answer.

## 2016-10-20 NOTE — Telephone Encounter (Signed)
Please call her back in regards to her records, and if she can come in and get the records.  I informed her that she could come in, and sign a release to get the records.  She said she had already signed a release.    I informed her that it usually takes at least 3 weeks for things to be processed via [employer].

## 2016-11-12 ENCOUNTER — Ambulatory Visit: Admit: 2016-11-12 | Discharge: 2016-11-12 | Payer: MEDICARE | Attending: Family Medicine | Primary: Family Medicine

## 2016-11-12 DIAGNOSIS — M79605 Pain in left leg: Secondary | ICD-10-CM

## 2016-11-12 MED ORDER — FUROSEMIDE 20 MG TAB
20 mg | ORAL_TABLET | Freq: Two times a day (BID) | ORAL | 11 refills | Status: DC
Start: 2016-11-12 — End: 2017-02-03

## 2016-11-12 NOTE — Patient Instructions (Signed)
Patient still has a copy of the Advanced Medical Directive form and understands to bring it in once completed.  Health Maintenance Due   Topic Date Due   ??? DTaP/Tdap/Td series (1 - Tdap) 04/12/1971   ??? ZOSTER VACCINE AGE 66>  02/08/2010   ??? GLAUCOMA SCREENING Q2Y  04/12/2015   ??? Pneumococcal 65+ Low/Medium Risk (2 of 2 - PPSV23) 04/24/2016   ??? Influenza Age 68 to Adult  09/24/2016

## 2016-11-12 NOTE — ACP (Advance Care Planning) (Signed)
Patient still has a copy of the Advanced Medical Directive form and understands to bring it in once completed.

## 2016-11-12 NOTE — Progress Notes (Signed)
[facility]:  11/12/2016, MRN: [id number]      Cathy [name] is a 66 y.o. female and presents to clinic for Hypertension (follow up) and Cholesterol Problem (follow up)    Subjective:   The patient is a 66 year old female with history of hypertension, chronic systolic heart failure (followed by Dr.McCray) with EF of 10%, mitral regurgitation, cholelithiasis, multinodular goiter, obstructive sleep apnea (not on rx), allergic rhinitis, osteoporosis, uterine fibroids per EHR, insomnia, cataracts, AICD placement, hyperlipidemia, and meningioma status post resection.    1. CHF/HLD: Present for over 3 months.  Followed by [facility] cardiology - Dr.McCray.  Her last echocardiogram and cardiac catheterization are listed below.  Her blood pressure is 84/40 today.  she takes Lasix, Coreg, lisinopril, spironolactone, and Lipitor.  She reports no adverse side effects from these medicines. Her weight is 184lbs today.  Since her last appointment, her Lasix was increased to twice a day.  She has an appointment next month with another Cardiology specialist for anticipated LVAD placement.  She continues to have shortness of breath but it has not worsened since her last appointment.  She continues to have chest pain episodes off and on.  Per her history, her Cardiology team is aware.  CP episodes have not worsened since her last apt. She has an AICD placed. The pt still engages in sexual intercourse with her husband.  ??  Echocardiogram 06/30/16: SEVERELY INCREASED LEFT VENTRICULAR CAVITY SIZE. SEVERELY REDUCED LEFT VENTRICULAR SYSTOLIC FUNCTION, ESTIMATED EJECTION FRACTION OF 10%. UNABLE TO ADEQUATELY ASSESS DIASTOLIC FUNCTION DUE TO ARRHYTHMIA. SEVERE GLOBAL HYPOKINESIS. SEVERELY DILATED LEFT ATRIUM. NORMAL RIGHT VENTRICULAR SIZE WITH MODERATELY REDUCED SYSTOLIC FUNCTION BY VISUAL  ASSESSMENT.  SEVERE CENTRAL MITRAL REGURGITATION.  ERO OF 0.29 CM2 AND REGURGITANT VOLUME OF 44 CM3. MODERATE TRICUSPID REGURGITATION.   MODERATE PULMONARY HYPERTENSION OF 52 MMHG.  TRIVIAL CIRCUMFERENTIAL PERICARDIAL EFFUSION PRESENT. PREVIOUS REPORT DONE ON 09/12/14 STATED AN EF OF 22%, MILD DIASTOLIC DYSFUNCTION, MODERATE MR, MILD TR, A PAP OF 30 MMHG, AND AN ECHOGENIC MASS SEEN IN RA ON LEAD.  ??  Cardiac Catheterization 07/02/16: Low normal left and right heart pressures, no hemodynamic  evidence for stenotic or regurgitant valve disease or  intracardiac shunts.  PCWP = 6 mmHg, V -8. PA=27/10 (17), RV=27/2, RA=2, MVO2  Sat=58%, art O2 Sat=90%, Est Fick CO/CI=4.8/2.33 respectively    2.  Dysphasia: Present for 1 month.  Associated with liquids and solid food particles.  She feels as if food gets stuck in her throat.  No alleviating factors are known.  Initially, symptoms were only present with drinking carbonated beverages.    3.  LLE Pain: Present since her last appointment.  She had ABI studies that were unremarkable, earlier this year.  Pain is mostly along the left upper calf area.  No alleviating factors are known. She had unremarkable PVL studies in 2014.     04/23/16 ABIs: Physiologic testing was performed using continuous wave Doppler and segmental pressures. No evidence of significant peripheral arterial disease at rest in the right leg. No evidence of significant peripheral arterial disease at rest in the left leg. The right ankle/brachial index is 1.17 and the left ankle/brachial index is 1.08.    4.  OSA and Insomnia: She does melatonin over-the-counter.  She has a history of OSA but is not receiving treatment.  She declined a referral to Sleep Medicine at this time.  She states that she has a hard time falling asleep if her husband wakes her up in the  middle the night.  Per her history, it sounds as if her husband has RLS.      Patient Active Problem List    Diagnosis Date Noted   ??? Age-related osteoporosis without current pathological fracture 03/04/2016   ??? Multinodular goiter 03/04/2016   ??? History of meningioma 03/04/2016    ??? Non-rheumatic mitral regurgitation 04/25/2014   ??? AR (allergic rhinitis) 09/29/2011   ??? Chronic systolic heart failure ([place])    ??? Automatic implantable cardioverter-defibrillator in situ    ??? Mixed hyperlipidemia    ??? AICD (automatic cardioverter/defibrillator) present 11/15/2010   ??? Cataract 12/01/2008   ??? Gallstones 11/13/2008   ??? Insomnia 11/13/2008   ??? OSA (obstructive sleep apnea) 11/13/2008   ??? Fibroid 06/23/2008   ??? HTN (hypertension) 06/23/2008       Current Outpatient Prescriptions   Medication Sig Dispense Refill   ??? spironolactone (ALDACTONE) 25 mg tablet Take 25 mg by mouth daily.     ??? furosemide (LASIX) 20 mg tablet Take 1 Tab by mouth two (2) times a day. 60 Tab 11   ??? carvedilol (COREG) 3.125 mg tablet TAKE ONE TABLET BY MOUTH TWICE A DAY 180 Tab 0   ??? cyclobenzaprine (FLEXERIL) 5 mg tablet Take 1 Tab by mouth three (3) times daily as needed for Muscle Spasm(s). 30 Tab 3   ??? atorvastatin calcium (LIPITOR PO) Take 40 mg by mouth daily.     ??? BABY ASPIRIN PO Take 81 mg by mouth daily.     ??? lisinopril (PRINIVIL, ZESTRIL) 5 mg tablet Take 1 Tab by mouth two (2) times a day. 180 Tab 1   ??? calcium-vitamin D (CALCIUM 600 + D,3,) 600 mg(1,500mg ) -200 unit tab Take 1 Tab by mouth two (2) times daily (with meals).     ??? fluticasone (FLONASE) 50 mcg/actuation nasal spray 2 Sprays by Both Nostrils route daily. (Patient taking differently: 2 Sprays by Both Nostrils route daily. As needed) 1 Bottle 5       Allergies   Allergen Reactions   ??? Pcn [Penicillins] Rash and Itching     Itching bumps on hands and arms  Itching bumps on hands and arms   ??? Penicillamine Itching   ??? Tramadol Other (comments)     Psychotic Hallucinations  Psychotic Hallucinations       Past Medical History:   Diagnosis Date   ??? Automatic implantable cardiac defibrillator in situ     Post ddd icd Set up carelink   ??? Breast mass, left     benign   ??? CHF (congestive heart failure) ([place]) 06/23/2008     Non-ischemic CMP , Cath (2008) Normal coronary arteries   ??? Chronic systolic heart failure ([facility])     Stable,    ??? Degenerative arthritis of left knee    ??? Fibroid 06/23/2008   ??? HLD (hyperlipidemia)    ??? HTN (hypertension) 06/23/2008   ??? Hypotension, unspecified     Related to lisinopril   ??? 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 Left knee pain     With swelling   ??? Leg pain    ??? 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 OSA (obstructive sleep apnea) 9/10   ??? Venous reflux    ??? Wears glasses        Past Surgical History:   Procedure Laterality Date   ??? HX BREAST BIOPSY  rt breast, benign   ??? HX CHOLECYSTECTOMY     ??? HX CRANIOTOMY  2013    meningioma    ??? HX HEENT      glasses   ??? HX PACEMAKER      dual chamber icd   ??? HX TONSIL AND ADENOIDECTOMY     ??? HX TOTAL ABDOMINAL HYSTERECTOMY  1996   ??? HX TUBAL LIGATION         Family History   Problem Relation Age of Onset   ??? Diabetes Mother    ??? Heart Attack Father      MI   ??? Heart Disease Maternal Grandmother    ??? Heart Disease Paternal Grandmother    ??? Kidney Disease Other      1 sib deceased   ??? Cancer Other      1 sib throat cancer   ??? Diabetes Other    ??? Diabetes Daughter    ??? Other Daughter      leukemia   ??? Hypertension Other        Social History   Substance Use Topics   ??? Smoking status: Never Smoker   ??? Smokeless tobacco: Never Used   ??? Alcohol use No       ROS   Review of Systems   Constitutional: Negative for chills and fever.   HENT: Negative for ear pain and sore throat.    Eyes: Positive for blurred vision. Negative for pain.   Respiratory: Negative for cough and shortness of breath.    Cardiovascular: Negative for chest pain.   Gastrointestinal: Negative for abdominal pain, blood in stool and melena.   Genitourinary: Negative for dysuria and hematuria.   Musculoskeletal: Positive for myalgias. Negative for joint pain.   Skin: Negative for rash.    Neurological: Negative for tingling, focal weakness and headaches.   Endo/Heme/Allergies: Does not bruise/bleed easily.   Psychiatric/Behavioral: Negative for substance abuse.       Objective     Vitals:    11/12/16 0946   BP: (!) 84/40   Pulse: 87   Resp: 14   Temp: 96.5 ??F (35.8 ??C)   TempSrc: Oral   SpO2: 98%   Weight: 184 lb 9.6 oz (83.7 kg)   Height: 5\' 10"  (1.778 m)   PainSc:   3   PainLoc: Leg       Physical Exam   Constitutional: She is oriented to person, place, and time and well-developed, well-nourished, and in no distress.   HENT:   Head: Normocephalic and atraumatic.   Right Ear: External ear normal.   Left Ear: External ear normal.   Nose: Nose normal.   Mouth/Throat: Oropharynx is clear and moist. No oropharyngeal exudate.   Eyes: Conjunctivae and EOM are normal. Pupils are equal, round, and reactive to light. Right eye exhibits no discharge. Left eye exhibits no discharge. No scleral icterus.   Neck: Neck supple.   Cardiovascular: Normal rate, regular rhythm, normal heart sounds and intact distal pulses.  Exam reveals no gallop and no friction rub.    No murmur heard.  Pulmonary/Chest: Effort normal and breath sounds normal. No respiratory distress. She has no wheezes. She has no rales.   Abdominal: Soft. Bowel sounds are normal. She exhibits no distension. There is no tenderness. There is no rebound and no guarding.   Musculoskeletal: She exhibits no edema (Bue/BLE) or tenderness (Bue).   LLE is TTP along the upper calf laterally/posteriorly.   Lymphadenopathy:  She has no cervical adenopathy.   Neurological: She is alert and oriented to person, place, and time. She exhibits normal muscle tone. Gait normal.   Skin: Skin is warm and dry. No erythema.   Psychiatric: Affect normal.   Nursing note and vitals reviewed.      LABS   Data Review:   Lab Results   Component Value Date/Time    WBC 5.6 07/16/2016 07:59 PM    Hemoglobin, POC 13.3 07/24/2016 01:32 PM    HGB 11.9 (L) 07/16/2016 07:59 PM     Hematocrit, POC 39 07/24/2016 01:32 PM    HCT 38.0 07/16/2016 07:59 PM    PLATELET 219 07/16/2016 07:59 PM    MCV 92.2 07/16/2016 07:59 PM       Lab Results   Component Value Date/Time    Sodium 140 07/16/2016 07:59 PM    Potassium 5.0 07/16/2016 07:59 PM    Chloride 103 07/16/2016 07:59 PM    CO2 32 07/16/2016 07:59 PM    Anion gap 5 07/16/2016 07:59 PM    Glucose 101 (H) 07/16/2016 07:59 PM    BUN 21 (H) 07/16/2016 07:59 PM    Creatinine 0.88 07/16/2016 07:59 PM    BUN/Creatinine ratio 24 (H) 07/16/2016 07:59 PM    GFR est AA >60 07/16/2016 07:59 PM    GFR est non-AA >60 07/16/2016 07:59 PM    Calcium 8.9 07/16/2016 07:59 PM       Lab Results   Component Value Date/Time    Cholesterol, total 161 04/09/2015 08:11 AM    HDL Cholesterol 73 (H) 04/09/2015 08:11 AM    LDL, calculated 79.8 04/09/2015 08:11 AM    VLDL, calculated 8.2 04/09/2015 08:11 AM    Triglyceride 41 04/09/2015 08:11 AM    CHOL/HDL Ratio 2.2 04/09/2015 08:11 AM       Lab Results   Component Value Date/Time    Hemoglobin A1c 5.8 12/09/2012 10:50 AM       Assessment/Plan:   1. HLD/CHF: Stable. +AICD. SOB/CP is at baseline.  ???Continue with medications as prescribed.  ???I encouraged her to continue following up with her Cardiology team for anticipated LVAD placement at some point.  ???I discouraged sexual intercourse given the severity of her underlying CHF.    2. Health Maintenance:  ???The flu vaccine, pneumococcal 23 vaccine, and tetanus vaccine were recommended today. The flu vaccine was administered.  ???Placing a referral for a formal eye exam.  She has not had one in 2 years.  She also has complaint of bilateral blurry vision peripherally.    3. LLE Pain: ABIs are reassuring.  ???Ordering a left lower extremity PVL study to rule out DVT.    4.  Dysphasia:  ???A barium swallow study was ordered.    5.  OSA and Insomnia:  ???Okay to continue with melatonin over-the-counter.  ???The patient once again declined a referral to Sleep Medicine.  She will  let me know when she is ready to return for evaluation.      Health Maintenance Due   Topic Date Due   ??? DTaP/Tdap/Td series (1 - Tdap) 04/12/1971   ??? ZOSTER VACCINE AGE 62>  02/08/2010   ??? GLAUCOMA SCREENING Q2Y  04/12/2015   ??? Pneumococcal 65+ Low/Medium Risk (2 of 2 - PPSV23) 04/24/2016   ??? Influenza Age 91 to Adult  09/24/2016     Lab review: labs are reviewed in the EHR    I have discussed the diagnosis with the patient and  the intended plan as seen in the above orders.  The patient has received an after-visit summary and questions were answered concerning future plans.  I have discussed medication side effects and warnings with the patient as well. I have reviewed the plan of care with the patient, accepted their input and they are in agreement with the treatment goals. All questions were answered. The patient understands the plan of care. Handouts provided today with above information. Pt instructed if symptoms worsen to call the office or report to the ED for continued care.  Greater than 50% of the visit time was spent in counseling and/or coordination of care.      Voice recognition was used to generate this report, which may have resulted in some phonetic based errors in grammar and contents. Even though attempts were made to correct all the mistakes, some may have been missed, and remained in the body of the document.      Follow-up Disposition:  Return in about 4 months (around 03/17/2017) for BP check, weight check.    Leo Rod, MD

## 2016-11-12 NOTE — Progress Notes (Signed)
Chief Complaint   Patient presents with   ??? Hypertension     follow up   ??? Cholesterol Problem     follow up     Patient still has a copy of the Advanced Medical Directive form and understands to bring it in once completed.  1. Have you been to the ER, urgent care clinic since your last visit?  Hospitalized since your last visit?No    2. Have you seen or consulted any other health care providers outside of the [facility] since your last visit?  Include any pap smears or colon screening. No      Brean Ellenora Talton is a 66 y.o. female who presents for routine immunizations.   She denies any symptoms , reactions or allergies that would exclude them from being immunized today.  Risks and adverse reactions were discussed and the VIS was given to them. All questions were addressed.  She was observed for 20 min post injection. There were no reactions observed.    Darrell Jewel

## 2016-11-19 NOTE — Telephone Encounter (Signed)
error 

## 2016-11-24 ENCOUNTER — Inpatient Hospital Stay: Admit: 2016-11-24 | Payer: MEDICARE | Attending: Family Medicine | Primary: Family Medicine

## 2016-11-24 DIAGNOSIS — R1310 Dysphagia, unspecified: Secondary | ICD-10-CM

## 2016-11-24 DIAGNOSIS — M79605 Pain in left leg: Secondary | ICD-10-CM

## 2016-11-24 MED ORDER — BARIUM SULFATE 700 MG TAB
700 mg | Freq: Once | ORAL | Status: AC
Start: 2016-11-24 — End: 2016-11-24
  Administered 2016-11-24: 13:00:00 via ORAL

## 2016-11-24 MED ORDER — BARIUM SULFATE 96 % (W/W) ORAL SUSP, RECON
96 % (w/w) | Freq: Once | ORAL | Status: AC
Start: 2016-11-24 — End: 2016-11-24
  Administered 2016-11-24: 13:00:00 via ORAL

## 2016-11-24 MED ORDER — SOD BICARB-CITRIC AC-SIMETH 2.21 GRAM-1.53 GRAM/4 GRAM ORAL GRAN IN PK
Freq: Once | ORAL | Status: AC
Start: 2016-11-24 — End: 2016-11-24
  Administered 2016-11-24: 13:00:00 via ORAL

## 2016-11-24 MED ORDER — BARIUM SULFATE 98 % ORAL SUSP, RECON
98 % | Freq: Once | ORAL | Status: AC
Start: 2016-11-24 — End: 2016-11-24
  Administered 2016-11-24: 13:00:00 via ORAL

## 2016-11-24 MED FILL — E-Z-PAQUE 96 % (W/W) ORAL POWDER FOR SUSPENSION: 96 % (w/w) | ORAL | Qty: 176

## 2016-11-24 MED FILL — E-Z DISK 700 MG TABLET: 700 mg | ORAL | Qty: 1

## 2016-11-24 MED FILL — E-Z-HD BARIUM 98 % ORAL SUSPENSION: 98 % | ORAL | Qty: 135

## 2016-11-24 MED FILL — E-Z-GAS II 2.21 GRAM-1.53 GRAM/4 GRAM ORAL EFFERVESCENT GRANULES PACK: ORAL | Qty: 1

## 2016-11-25 ENCOUNTER — Encounter

## 2016-11-25 NOTE — Progress Notes (Signed)
Please let her know that her study shows evidence of a Baker's cyst - which is the likely source of her pain. This can be drained (sometimes) and it will unfortunately come back. It can be removed by Orthopedics if she is interested. If she wants to discuss these options with Orthopedics, please let me know so that I can refer her to Orthopedics.    Dr. Marius Ditch  [facility], [place] [address]  Phone: [phone]  Fax: [phone]

## 2016-11-25 NOTE — Telephone Encounter (Signed)
Chief Complaint   Patient presents with   ??? Results     done 11-24-16 XR BA Swallow Esophogram per Dr Cornelia Copa     Please let her know that she likely has achalasia - a condition causing her dysphagia sx. It's a condition caused by incorrect peristalsis/movement of esophageal muscles. I am referring her to GI for additional evaluation.     Dr. Marius Ditch   Please let her know that she likely has achalasia - a condition causing her dysphagia sx. It's a condition caused by incorrect peristalsis/movement of esophageal muscles. I am referring her to GI for additional evaluation.     Dr. Marius Ditch   The patient was reached and informed, she understands the Gastroenterology Team will contact her direct to schedule her appointment, and to give it between 3-10 business days to hear from them.

## 2016-11-25 NOTE — Progress Notes (Signed)
Please let her know that she likely has achalasia - a condition causing her dysphagia sx. It's a condition caused by incorrect peristalsis/movement of esophageal muscles. I am referring her to GI for additional evaluation.    Dr. Marius Ditch  [facility], [place] [address]  Phone: [phone]  Fax: [phone]

## 2016-11-25 NOTE — Telephone Encounter (Signed)
Chief Complaint   Patient presents with   ??? Results     done 11-24-16 per Dr Cornelia Copa Duplex Lower EXT Venous, XR BA Swallow Esophogram     Please let her know that her study shows evidence of a Baker's cyst - which is the likely source of her pain. This can be drained (sometimes) and it will unfortunately come back. It can be removed by Orthopedics if she is interested. If she wants to discuss these options with Orthopedics, please let me know so that I can refer her to Orthopedics.     Dr. Marius Ditch   The patient was reached and informed, she understands all, and is asking to be referred out to the Orthopedic Team for further care and evaluation.  The patient understands I will send her agreement to be referred out to the Orthopedic Team, and she understands the Orthopedic Scheduler will follow up with her to schedule her new appointment within the next 3-10 business days.

## 2016-11-27 ENCOUNTER — Institutional Professional Consult (permissible substitution): Admit: 2016-11-27 | Discharge: 2016-11-27 | Payer: MEDICARE | Primary: Family Medicine

## 2016-11-27 DIAGNOSIS — Z23 Encounter for immunization: Secondary | ICD-10-CM

## 2016-11-27 NOTE — Progress Notes (Signed)
Cathy [name] is a 66 y.o. female who presents for routine immunizations. Per verbal order from [facility]. Pneumovax given in left deltoid.   She denies any symptoms , reactions or allergies that would exclude them from being immunized today.  Risks and adverse reactions were discussed and the VIS was given to them. All questions were addressed.  She was observed for 10 min post injection. There were no reactions observed.    Shawna Clamp, LPN

## 2016-12-01 MED ORDER — ATORVASTATIN 40 MG TAB
40 mg | ORAL_TABLET | Freq: Every day | ORAL | 3 refills | Status: DC
Start: 2016-12-01 — End: 2017-05-12

## 2016-12-08 ENCOUNTER — Ambulatory Visit: Admit: 2016-12-08 | Discharge: 2016-12-08 | Payer: MEDICARE | Attending: Orthopaedic Surgery | Primary: Family Medicine

## 2016-12-08 ENCOUNTER — Ambulatory Visit: Attending: Orthopaedic Surgery | Primary: Family Medicine

## 2016-12-08 DIAGNOSIS — M7122 Synovial cyst of popliteal space [Baker], left knee: Secondary | ICD-10-CM

## 2016-12-08 MED ORDER — MELOXICAM 15 MG TAB
15 mg | ORAL_TABLET | Freq: Every day | ORAL | 1 refills | Status: DC
Start: 2016-12-08 — End: 2017-02-03

## 2016-12-08 NOTE — Progress Notes (Signed)
Cathy [name]  12-19-1950   Chief Complaint   Patient presents with   ??? Knee Pain     left knee pain (bakers cyst)        HISTORY OF PRESENT ILLNESS  Cathy [name] is a 66 y.o. female who presents today for reevaluation of left knee pain. The patient was referred by Azzie Almas, MD. Patient rates pain as 8/10 today. The patient had a cortisone injection about 2 years ago. Pain with prolonged sitting, and tender to the touch. Patient denies any fever, chills, chest pain, shortness of breath or calf pain. The remainder of the review of systems is negative. There are no new illness or injuries to report since last seen in the office. There are no changes to medications, allergies, family or social history.     PHYSICAL EXAM:   Visit Vitals   ??? BP 92/59   ??? Pulse 88   ??? Temp 97.9 ??F (36.6 ??C) (Oral)   ??? Resp 16   ??? Ht 5\' 10"  (1.778 m)   ??? Wt 185 lb (83.9 kg)   ??? SpO2 99%   ??? BMI 26.54 kg/m2     The patient is a well-developed, well-nourished female   in no acute distress.  The patient is alert and oriented times three.  The patient is alert and oriented times three. Mood and affect are normal.  LYMPHATIC: lymph nodes are not enlarged and are within normal limits  SKIN: normal in color and non tender to palpation. There are no bruises or abrasions noted.   NEUROLOGICAL: Motor sensory exam is within normal limits. Reflexes are equal bilaterally. There is normal sensation to pinprick and light touch  MUSCULOSKELETAL:  Examination Left knee   Skin Intact   Range of motion 0-130   Effusion +   Medial joint line tenderness +   Lateral joint line tenderness -   Tenderness Pes Bursa -   Tenderness insertion MCL -   Tenderness insertion LCL -   McMurray???s -   Patella crepitus +   Patella grind -   Lachman -   Pivot shift -   Anterior drawer -   Posterior drawer -   Varus stress -   Valgus stress -   Neurovascular Intact   Calf Swelling and Tenderness to Palpation -   Homan's Test -    Hamstring Cord Tightness -   Palpable cyst posterior aspect of the leg    PROCEDURE: Limited Ultrasound Exam of  Left Knee    Indications: Pain in Left knee  Assess: Baker's Cyst    Diagnostic Ultrasound: 2D, gray scale B mode Limited ultrasound exam using a GE Ultrasound machine with a linear probe with evaluation of Left knee.    Ultrasound images captured and scanned into patient's chart.       Assessment: Well circumscribed baker's cyst, 3x3 cm in diameter    IMAGING: XR of left knee dated 12/08/16 was reviewed and read: moderate degenerative changes in all 3 compartments     IMPRESSION:      ICD-10-CM ICD-9-CM    1. Baker's cyst, left M71.22 727.51 [place] EXT NONVAS LT LTD      meloxicam (MOBIC) 15 mg tablet   2. Left knee pain, unspecified chronicity M25.562 719.46 AMB POC XRAY, KNEE; 1/2 VIEWS   3. Primary osteoarthritis of left knee M17.12 715.16 meloxicam (MOBIC) 15 mg tablet        PLAN:   1. The patient presents today with  left knee pain due to XR documented osteoarthritis and a baker's cyst.   Risk factors include: htn  2. Yes ultrasound exam indicated today L KNEE   3. No cortisone injection indicated today   4. No Physical/Occupational Therapy indicated today  5. No diagnostic test indicated today:   6. No durable medical equipment indicated today  7. No referral indicated today   8. Yes medications indicated today: MOBIC  9. No Narcotic indicated today     RTC 4 weeks if pain continues  Follow-up Disposition: Not on File    Scribed by [facility]) as dictated by Richardo Hanks, MD    I, Dr. Richardo Hanks, confirm that all documentation is accurate.    Richardo Hanks, M.D.   [facility]

## 2016-12-15 ENCOUNTER — Encounter

## 2016-12-17 ENCOUNTER — Inpatient Hospital Stay: Payer: MEDICARE | Attending: Ophthalmology | Primary: Family Medicine

## 2016-12-17 DIAGNOSIS — H4720 Unspecified optic atrophy: Secondary | ICD-10-CM

## 2016-12-22 ENCOUNTER — Inpatient Hospital Stay: Payer: MEDICARE

## 2016-12-22 MED ORDER — LIDOCAINE 2 % MUCOUS MEMBRANE JELLY IN APPLICATOR
2 % | Status: DC | PRN
Start: 2016-12-22 — End: 2016-12-22
  Administered 2016-12-22: 10:00:00 via TOPICAL

## 2016-12-22 MED FILL — GLYDO 2 % MUCOSAL JELLY IN APPLICATOR: 2 % | Qty: 6

## 2016-12-22 NOTE — Other (Signed)
Patient unable to tolerated procedure. Attempt of intubation of probe into right nare. Met no resistance patient stated that she did not want to perform the procedure. Procedure aborted. Doctor aware.

## 2016-12-24 MED ORDER — CARVEDILOL 3.125 MG TAB
3.125 mg | ORAL_TABLET | ORAL | 0 refills | Status: DC
Start: 2016-12-24 — End: 2017-05-12

## 2016-12-25 ENCOUNTER — Encounter

## 2016-12-29 ENCOUNTER — Encounter

## 2017-01-06 ENCOUNTER — Inpatient Hospital Stay: Admit: 2017-01-06 | Payer: MEDICARE | Attending: Ophthalmology | Primary: Family Medicine

## 2017-01-06 DIAGNOSIS — H47293 Other optic atrophy, bilateral: Secondary | ICD-10-CM

## 2017-01-06 LAB — CREATININE, POC
Creatinine, POC: 0.7 MG/DL (ref 0.6–1.3)
GFRAA, POC: >60 mL/min/{1.73_m2} (ref >60)
GFRNA, POC: >60 mL/min/{1.73_m2} (ref >60)

## 2017-01-06 MED ORDER — IOPAMIDOL 61 % IV SOLN
300 mg iodine /mL (61 %) | Freq: Once | INTRAVENOUS | Status: AC
Start: 2017-01-06 — End: 2017-01-06
  Administered 2017-01-06: 15:00:00 via INTRAVENOUS

## 2017-01-06 MED ORDER — ASPIRIN 81 MG CHEWABLE TAB
81 mg | ORAL_TABLET | ORAL | 3 refills | Status: DC
Start: 2017-01-06 — End: 2017-05-12

## 2017-01-06 MED FILL — ISOVUE-300  61 % INTRAVENOUS SOLUTION: 300 mg iodine /mL (61 %) | INTRAVENOUS | Qty: 80

## 2017-01-06 NOTE — Telephone Encounter (Signed)
Last Visit: 11/12/2016 with MD Cornelia Copa  Next Appointment: 03/17/2017 with MD Cornelia Copa    Requested Prescriptions     Pending Prescriptions Disp Refills   ??? aspirin 81 mg chewable tablet 90 Tab 3     Sig: Chew 1 tablet by mouth daily.

## 2017-01-07 ENCOUNTER — Inpatient Hospital Stay: Payer: MEDICARE

## 2017-01-07 LAB — POC 6 PLUS
BUN, POC: 20 MG/DL — ABNORMAL HIGH (ref 7–18)
Chloride, POC: 107 MMOL/L (ref 100–108)
Glucose, POC: 93 MG/DL (ref 74–106)
Hematocrit, POC: 32 % — ABNORMAL LOW (ref 36–49)
Hemoglobin, POC: 10.9 G/DL — ABNORMAL LOW (ref 12–16)
Potassium, POC: 4.5 MMOL/L (ref 3.5–5.5)
Sodium, POC: 146 MMOL/L — ABNORMAL HIGH (ref 136–145)

## 2017-01-07 MED ORDER — LACTATED RINGERS IV
INTRAVENOUS | Status: DC
Start: 2017-01-07 — End: 2017-01-07

## 2017-01-07 MED ORDER — PROPOFOL 10 MG/ML IV EMUL
10 mg/mL | INTRAVENOUS | Status: AC
Start: 2017-01-07 — End: ?

## 2017-01-07 MED ORDER — PROPOFOL 10 MG/ML IV EMUL
10 mg/mL | INTRAVENOUS | Status: DC | PRN
Start: 2017-01-07 — End: 2017-01-07
  Administered 2017-01-07: 16:00:00 via INTRAVENOUS

## 2017-01-07 MED ORDER — LIDOCAINE (PF) 20 MG/ML (2 %) IJ SOLN
20 mg/mL (2 %) | INTRAMUSCULAR | Status: AC
Start: 2017-01-07 — End: ?

## 2017-01-07 MED ORDER — SODIUM CHLORIDE 0.9 % IJ SYRG
Freq: Three times a day (TID) | INTRAMUSCULAR | Status: DC
Start: 2017-01-07 — End: 2017-01-07

## 2017-01-07 MED ORDER — LACTATED RINGERS IV
INTRAVENOUS | Status: DC
Start: 2017-01-07 — End: 2017-01-07
  Administered 2017-01-07 (×2): via INTRAVENOUS

## 2017-01-07 MED ORDER — SODIUM CHLORIDE 0.9 % IJ SYRG
INTRAMUSCULAR | Status: DC | PRN
Start: 2017-01-07 — End: 2017-01-07

## 2017-01-07 MED ORDER — INSULIN LISPRO 100 UNIT/ML INJECTION
100 unit/mL | Freq: Once | SUBCUTANEOUS | Status: DC
Start: 2017-01-07 — End: 2017-01-07

## 2017-01-07 MED ORDER — LIDOCAINE (PF) 10 MG/ML (1 %) IJ SOLN
10 mg/mL (1 %) | INTRAMUSCULAR | Status: DC | PRN
Start: 2017-01-07 — End: 2017-01-07

## 2017-01-07 MED ORDER — SODIUM CHLORIDE 0.9 % INJECTION
202 mg/2 mL | Freq: Once | INTRAMUSCULAR | Status: AC
Start: 2017-01-07 — End: 2017-01-07
  Administered 2017-01-07: 15:00:00 via INTRAVENOUS

## 2017-01-07 MED FILL — BD POSIFLUSH NORMAL SALINE 0.9 % INJECTION SYRINGE: INTRAMUSCULAR | Qty: 10

## 2017-01-07 MED FILL — LACTATED RINGERS IV: INTRAVENOUS | Qty: 1000

## 2017-01-07 MED FILL — PROPOFOL 10 MG/ML IV EMUL: 10 mg/mL | INTRAVENOUS | Qty: 20

## 2017-01-07 MED FILL — XYLOCAINE-MPF 20 MG/ML (2 %) INJECTION SOLUTION: 20 mg/mL (2 %) | INTRAMUSCULAR | Qty: 5

## 2017-01-07 MED FILL — FAMOTIDINE (PF) 20 MG/2 ML IV: 20 mg/2 mL | INTRAVENOUS | Qty: 2

## 2017-01-07 NOTE — H&P (Signed)
[email or web address]  [phone]      GASTROENTEROLOGY Pre-Procedure H and P      Impression/Plan:   1. This patient is consented for an EGD for dysphagia      Chief Complaint: dysphagia      HPI:  Cathy [name] is a 66 y.o. female who is being is having an EGD for dysphagia  PMH:   Past Medical History:   Diagnosis Date   ??? Automatic implantable cardiac defibrillator in situ     Post ddd icd Set up [facility]   ??? Baker cyst, left 12/2016   ??? Breast mass, left     benign   ??? CHF (congestive heart failure) ([facility]) 06/23/2008    Non-ischemic CMP , Cath (2008) Normal coronary arteries   ??? Chronic systolic heart failure (HCC)     Stable,    ??? Degenerative arthritis of left knee    ??? Fibroid 06/23/2008   ??? History of brain tumor 2013   ??? HLD (hyperlipidemia)    ??? HTN (hypertension) 06/23/2008   ??? Hypotension, unspecified     Related to lisinopril   ??? 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 Left knee pain     With swelling   ??? Leg pain    ??? 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 OSA (obstructive sleep apnea) 9/10   ??? Venous reflux    ??? Wears glasses        PSH:   Past Surgical History:   Procedure Laterality Date   ??? HX BREAST BIOPSY      rt breast, benign   ??? HX CHOLECYSTECTOMY     ??? HX CRANIOTOMY  2013    meningioma    ??? HX HEENT      glasses   ??? HX HYSTERECTOMY     ??? HX IMPLANTABLE CARDIOVERTER DEFIBRILLATOR     ??? HX PACEMAKER  2013    dual chamber icd   ??? HX TONSIL AND ADENOIDECTOMY     ??? HX TOTAL ABDOMINAL HYSTERECTOMY  1996   ??? HX TUBAL LIGATION         Social HX:   Social History     Socioeconomic History   ??? Marital status: MARRIED     Spouse name: Not on file   ??? Number of children: Not on file   ??? Years of education: Not on file   ??? Highest education level: Not on file   Social Needs   ??? Financial resource strain: Not on file   ??? Food insecurity - worry: Not on file   ??? Food insecurity - inability: Not on file    ??? Transportation needs - medical: Not on file   ??? Transportation needs - non-medical: Not on file   Occupational History   ??? Occupation: home health aide   Tobacco Use   ??? Smoking status: Never Smoker   ??? Smokeless tobacco: Never Used   Substance and Sexual Activity   ??? Alcohol use: No   ??? Drug use: No   ??? Sexual activity: Yes     Partners: Male     Birth control/protection: Condom   Other Topics Concern   ??? Not on file   Social History Narrative   ??? Not on file       FHX:   Family History   Problem Relation Age of Onset   ??? Diabetes Mother    ???  Heart Attack Father         MI   ??? Heart Disease Maternal Grandmother    ??? Heart Disease Paternal Grandmother    ??? Kidney Disease Other         1 sib deceased   ??? Cancer Other         1 sib throat cancer   ??? Diabetes Other    ??? Diabetes Daughter    ??? Other Daughter         leukemia   ??? Hypertension Other        Allergy:   Allergies   Allergen Reactions   ??? Pcn [Penicillins] Rash and Itching     Itching bumps on hands and arms  Itching bumps on hands and arms   ??? Penicillamine Itching   ??? Tramadol Other (comments)     Psychotic Hallucinations  Psychotic Hallucinations       Home Medications:     Medications Prior to Admission   Medication Sig   ??? aspirin 81 mg chewable tablet Chew 1 tablet by mouth daily.   ??? carvedilol (COREG) 3.125 mg tablet TAKE ONE TABLET BY MOUTH TWICE A DAY   ??? meloxicam (MOBIC) 15 mg tablet Take 1 Tab by mouth daily (with breakfast).   ??? atorvastatin (LIPITOR) 40 mg tablet Take 1 Tab by mouth daily.   ??? spironolactone (ALDACTONE) 25 mg tablet Take 25 mg by mouth daily.   ??? furosemide (LASIX) 20 mg tablet Take 1 Tab by mouth two (2) times a day.   ??? BABY ASPIRIN PO Take 81 mg by mouth daily.   ??? lisinopril (PRINIVIL, ZESTRIL) 5 mg tablet Take 1 Tab by mouth two (2) times a day.   ??? calcium-vitamin D (CALCIUM 600 + D,3,) 600 mg(1,500mg ) -200 unit tab Take 1 Tab by mouth two (2) times daily (with meals).    ??? fluticasone (FLONASE) 50 mcg/actuation nasal spray 2 Sprays by Both Nostrils route daily. (Patient taking differently: 2 Sprays by Both Nostrils route daily. As needed)       Review of Systems:     Constitutional: No fevers, chills, weight loss, fatigue.   Skin: No rashes, pruritis, jaundice, ulcerations, erythema.   HENT: No headaches, nosebleeds, sinus pressure, rhinorrhea, sore throat.   Eyes: No visual changes, blurred vision, eye pain, photophobia, jaundice.   Cardiovascular: No chest pain, heart palpitations.   Respiratory: No cough, SOB, wheezing, chest discomfort, orthopnea.   Gastrointestinal:    Genitourinary: No dysuria, bleeding, discharge, pyuria.   Musculoskeletal: No weakness, arthralgias, wasting.   Endo: No sweats.   Heme: No bruising, easy bleeding.   Allergies: As noted.   Neurological: Cranial nerves intact.  Alert and oriented. Gait not assessed.   Psychiatric:  No anxiety, depression, hallucinations.          Visit Vitals  BP 106/70   Pulse 80   Temp 97.4 ??F (36.3 ??C)   Resp 20   Ht 5\' 10"  (1.778 m)   Wt 85.8 kg (189 lb 3.2 oz)   SpO2 99%   BMI 27.15 kg/m??       Physical Assessment:     constitutional: appearance: well developed, well nourished, normal habitus, no deformities, in no acute distress.   skin: inspection: no rashes, ulcers, icterus or other lesions; no clubbing or telangiectasias. palpation: no induration or subcutaneos nodules.   eyes: inspection: normal conjunctivae and lids; no jaundice pupils: normal  ENMT: mouth: normal oral mucosa,lips and gums; good dentition.  oropharynx: normal tongue, hard and soft palate; posterior pharynx without erithema, exudate or lesions.   neck: thyroid: normal size, consistency and position; no masses or tenderness.   respiratory: effort: normal chest excursion; no intercostal retraction or accessory muscle use.   cardiovascular: abdominal aorta: normal size and position; no bruits.  palpation: PMI of normal size and position; normal rhythm; no thrill or murmurs.   abdominal: abdomen: normal consistency; no tenderness or masses. hernias: no hernias appreciated. liver: normal size and consistency. spleen: not palpable.   rectal: hemoccult/guaiac: not performed.   musculoskeletal: digits and nails: no clubbing, cyanosis, petechiae or other inflammatory conditions. gait: normal gait and station head and neck: normal range of motion; no pain, crepitation or contracture. spine/ribs/pelvis: normal range of motion; no pain, deformity or contracture.   neurologic: cranial nerves: II-XII normal.   psychiatric: judgement/insight: within normal limits. memory: within normal limits for recent and remote events. mood and affect: no evidence of depression, anxiety or agitation. orientation: oriented to time, space and person.        Basic Metabolic Profile   No results for input(s): NA, K, CL, CO2, BUN, GLU, CA, MG, PHOS in the last 72 hours.    No lab exists for component: CREAT      CBC w/Diff    No results for input(s): WBC, RBC, HGB, HCT, MCV, MCH, MCHC, RDW, PLT, HGBEXT, HCTEXT, PLTEXT in the last 72 hours.    No lab exists for component: MPV No results for input(s): GRANS, LYMPH, EOS, PRO, MYELO, METAS, BLAST in the last 72 hours.    No lab exists for component: MONO, BASO     Hepatic Function   No results for input(s): ALB, TP, TBILI, GPT, SGOT, AP, AML, LPSE in the last 72 hours.    No lab exists for component: DBILI     Coags   No results for input(s): PTP, INR, APTT in the last 72 hours.    No lab exists for component: Joycelyn Das, MD  [facility] [place], [place]  Cell: [phone]  Direct pager: [phone]  [email or web address]  [email or web address]

## 2017-01-07 NOTE — Anesthesia Pre-Procedure Evaluation (Signed)
Anesthetic History   No history of anesthetic complications            Review of Systems / Medical History  Patient summary reviewed and pertinent labs reviewed    Pulmonary        Sleep apnea: No treatment           Neuro/Psych   Within defined limits           Cardiovascular    Hypertension: well controlled          Pacemaker (Last checked -5/18)    Exercise tolerance: >4 METS     GI/Hepatic/Renal                Endo/Other      Hypothyroidism: well controlled       Other Findings              Physical Exam    Airway  Mallampati: II  TM Distance: 4 - 6 cm  Neck ROM: normal range of motion   Mouth opening: Normal     Cardiovascular               Dental  No notable dental hx       Pulmonary  Breath sounds clear to auscultation               Abdominal  GI exam deferred       Other Findings            Anesthetic Plan    ASA: 3  Anesthesia type: MAC          Induction: Intravenous  Anesthetic plan and risks discussed with: Patient

## 2017-01-07 NOTE — Other (Signed)
Patient armband removed and shredded

## 2017-01-07 NOTE — Anesthesia Post-Procedure Evaluation (Addendum)
Procedure(s):  ENDOSCOPY WITH ESOPHAGEAL DILATATION and bx's.    Anesthesia Post Evaluation      Multimodal analgesia: multimodal analgesia used between 6 hours prior to anesthesia start to PACU discharge  Patient location during evaluation: PACU  Patient participation: complete - patient participated  Level of consciousness: awake  Pain score: 2  Pain management: satisfactory to patient  Airway patency: patent  Anesthetic complications: no  Cardiovascular status: acceptable  Respiratory status: acceptable  Hydration status: acceptable  Post anesthesia nausea and vomiting:  none      Visit Vitals  BP 102/60   Pulse 79   Temp 36.9 ??C (98.4 ??F)   Resp 21   Ht 5\' 10"  (1.778 m)   Wt 85.8 kg (189 lb 3.2 oz)   SpO2 100%   BMI 27.15 kg/m??

## 2017-01-07 NOTE — Progress Notes (Signed)
[email or web address]  [name] Procedure Note    Cathy [name]  [birth year]  [id number]    Date of Procedure: 01/07/2017    Preoperative diagnosis: ACHALASIA  DYSPHASIA  LEFT VENTRICULAR DIASTOLIC DYSFUNCTION  CHF    Postoperative diagnosis: gastric ulcer, gastrix bx's r/o h pylori, fundic diverticula, 54 fr dilation    Type of Anesthesia: MAC (Monitored anesthesia care)    Description of findings: same as post op dx    Procedure: Procedure(s):  ENDOSCOPY WITH ESOPHAGEAL DILATATION and bx's    Operator:  Dr. Alferd Patee, MD    Assistant(s): Endoscopy Technician-1: Lieutenant Diego, Randalyn Rhea  Endoscopy RN-1: Arlee Muslim, RN; Christella Noa, RN    Devices/implants/grafts/tissues/prosthesis: None    TAV:WPVX    Specimens:   ID Type Source Tests Collected by Time Destination   1 : gastric bx's Preservative Gastric  Alferd Patee, MD 01/07/2017 1103 Pathology       Findings: See printed and scanned procedure note    Complications: None    Dr. Alferd Patee, MD  01/07/2017  11:22 AM

## 2017-01-11 MED FILL — PROPOFOL 10 MG/ML IV EMUL: 10 mg/mL | INTRAVENOUS | Qty: 100

## 2017-01-16 MED ORDER — DENOSUMAB 60 MG/ML SUB-Q SYRINGE
60 mg/mL | Freq: Once | SUBCUTANEOUS | Status: AC
Start: 2017-01-16 — End: 2017-01-22
  Administered 2017-01-22: 18:00:00 via SUBCUTANEOUS

## 2017-01-16 MED FILL — PROLIA 60 MG/ML SUBCUTANEOUS SYRINGE: 60 mg/mL | SUBCUTANEOUS | Qty: 1

## 2017-01-22 ENCOUNTER — Inpatient Hospital Stay: Admit: 2017-01-22 | Payer: MEDICARE | Primary: Family Medicine

## 2017-01-22 DIAGNOSIS — M810 Age-related osteoporosis without current pathological fracture: Secondary | ICD-10-CM

## 2017-01-22 LAB — POC CHEM8
Anion gap, POC: 15 (ref 10–20)
BUN, POC: 23 MG/DL — ABNORMAL HIGH (ref 7–18)
CO2, POC: 27 MMOL/L — ABNORMAL HIGH (ref 19–24)
Calcium, ionized (POC): 1.1 mmol/L — ABNORMAL LOW (ref 1.12–1.32)
Chloride, POC: 106 MMOL/L (ref 100–108)
Creatinine, POC: 0.9 MG/DL (ref 0.6–1.3)
GFRAA, POC: >60 mL/min/{1.73_m2} (ref >60)
GFRNA, POC: >60 mL/min/{1.73_m2} (ref >60)
Glucose, POC: 111 MG/DL — ABNORMAL HIGH (ref 74–106)
Hematocrit, POC: 33 % — ABNORMAL LOW (ref 36–49)
Hemoglobin, POC: 11.2 G/DL — ABNORMAL LOW (ref 12–16)
Potassium, POC: 4.5 MMOL/L (ref 3.5–5.5)
Sodium, POC: 142 MMOL/L (ref 136–145)

## 2017-01-22 LAB — CALCIUM, IONIZED: Ionized Calcium: 1.27 MMOL/L (ref 1.12–1.32)

## 2017-01-22 LAB — MAGNESIUM: Magnesium: 2.1 mg/dL (ref 1.6–2.6)

## 2017-01-22 LAB — PHOSPHORUS: Phosphorus: 3.6 MG/DL (ref 2.5–4.9)

## 2017-01-22 NOTE — Progress Notes (Signed)
[facility] Progress Note    Date: January 22, 2017    Name: Kimmarie Pascale    MRN: [id number]         DOB: 1950-11-17     Prolia injection      Ms. Veron to [place], ambulatory at 1315. Pt was assessed and education was provided.   Patient has received Prolia injections in the past without adverse effects. She confirms she is taking oral calcium and Vit D supplements.     Ms. Hornig's vitals were reviewed and patient was observed for 5 minutes prior to treatment.     Patient Vitals for the past 4 hrs:   Temp Pulse Resp BP SpO2   01/22/17 1300 97.9 ??F (36.6 ??C) 91 18 94/63 97 %           Blood obtained peripherally from right AC with butterfly needle without difficulty and sent to lab for Mg and Phos per written orders. BMP run on iSTAT. No bleeding or hematoma noted at site. Guaze and coban applied.    Lab results were obtained and reviewed.  Recent Results (from the past 12 hour(s))   POC CHEM8    Collection Time: 01/22/17  1:08 PM   Result Value Ref Range    CO2, POC 27 (H) 19 - 24 MMOL/L    Glucose, POC 111 (H) 74 - 106 MG/DL    BUN, POC 23 (H) 7 - 18 MG/DL    Creatinine, POC 0.9 0.6 - 1.3 MG/DL    GFRAA, POC >60 >60 ml/min/1.39m2    GFRNA, POC >60 >60 ml/min/1.68m2    Sodium, POC 142 136 - 145 MMOL/L    Potassium, POC 4.5 3.5 - 5.5 MMOL/L    Calcium, ionized (POC) 1.10 (L) 1.12 - 1.32 mmol/L    Chloride, POC 106 100 - 108 MMOL/L    Anion gap, POC 15 10 - 20      Hematocrit, POC 33 (L) 36 - 49 %    Hemoglobin, POC 11.2 (L) 12 - 16 G/DL     Ionized calcium results WNL at 1.10 and out of parameters to give Prolia today. Pharmacy notified and requested to have pt's provider called to give order on holding/administering Prolia.  Ionized calcium sent to the lab for confirmation.     Order received from Dr. Cornelia Copa to give Prolia.    Prolia 60 mg was administered subcutaneous in back of left upper arm. No irritation or bleeding noted at site. Bandaid applied.      Ms. Maiorino tolerated well, and had no complaints.  Patient armband removed and shredded.    Ms. Bondar was discharged from [place] in stable condition at 1325. She is to return on 07/23/17 at 1300 for her next Prolia appointment.    Pryor Montes, RN  January 22, 2017

## 2017-02-03 ENCOUNTER — Encounter

## 2017-02-03 MED ORDER — MELOXICAM 15 MG TAB
15 mg | ORAL_TABLET | Freq: Every day | ORAL | 1 refills | Status: DC
Start: 2017-02-03 — End: 2017-03-17

## 2017-02-04 NOTE — Telephone Encounter (Signed)
Pt calling again asking Dr. Cornelia Copa to refill Lasix. Says med was increased to 2 times a day so she is out.

## 2017-02-05 MED ORDER — FUROSEMIDE 20 MG TAB
20 mg | ORAL_TABLET | Freq: Two times a day (BID) | ORAL | 11 refills | Status: DC
Start: 2017-02-05 — End: 2017-05-12

## 2017-02-09 ENCOUNTER — Inpatient Hospital Stay: Payer: MEDICARE

## 2017-02-09 MED ORDER — LIDOCAINE 2 % MUCOUS MEMBRANE JELLY IN APPLICATOR
2 % | Freq: Once | Status: AC
Start: 2017-02-09 — End: 2017-02-09
  Administered 2017-02-09: 14:00:00 via URETHRAL

## 2017-02-09 MED FILL — GLYDO 2 % MUCOSAL JELLY IN APPLICATOR: 2 % | Qty: 6

## 2017-02-09 NOTE — Procedures (Addendum)
[email or web address]  [phone]    HIGH RESOLUTION ESOPHAGEAL MANOMETRY REPORT    Indication:   1. Esophageal dysphagia R 13.19    Date of procedure: 02/09/2017  Procedure:     After confirmation of potential allergies, a topical analgesic was used to numb the nares followed by trans-nasal insertion of a High Resolution Manometry Catheter. Pressure bands of both UES and LES were observed on the color contour. Patient instructed to take deep breath to verify placement of catheter; diaphragmatic pinch noted on inspiration. Patient was assisted to supine position and catheter was stabilized by taping at the nares. Patient wasencouraged to relax while acclimating to catheter for approximately 5 minutes. A 30 second baseline pressure was obtained to identify the UES and LES followed by a series of wet swallows using 5 ccs room temperature normal saline to assess esophageal motility and bolus transit. At the end of the study, a multiple rapid swallow sequence was performed. At the conclusion of the procedure; the catheter was removed.      Findings:    1. Esophago-Gastric junction: There is incomplete swallow induced relaxation of the Esophago-gastric junction. The mean IRP is 47.7 mm Hg (normal is less than 15).Baseline pressure is also elevated at 51 mmHg.  There appears to be a strong vascular signal in the area of the EG junction    2. Esophageal body:All swallows show normal intact contraction patterns and normal peristaltic vigour. There is no suggestion of a hypercontractile contraction (Jackhammer esophagus) or distal esophageal spasm.  There is NO evidence of aperistalsis    3. Impedance: There is complete bolus clearance on 8 of the 10 swallows    Impression:    1. 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 This situation is sometimes seen with EoE or with a stricture or  a mechanical obstruction. Sometimes a hiatus hernia (sliding or paraesophageal) may present with this manometric configuration, though no clear hernia was seen on this study.    Rarely this pattern has been seen with early or incompletely expressed achalasia, with vascular obstruction of the distal esophagus, and with esophageal wall stiffness due to an infiltrative disease or cancer.    Consider empiric dilation, especially if the patient has dysphagia. Consideration may also be done for mid and distal esophageal biopsies in the right setting. Consider Ba swallow to evaluate the EG junction/Hiatus hernia with a complementary modality.     Given that peristalsis is preserved and with good bolus transit and normal intrabolus pressure at the E-G junction, this does not fit the criteria for Achalasia. However, rarely, this condition may progress to Achalasia-Consider repeat manometry if dysphagia develops or significantly worsens in the future.    Given that there appears to be a vascular signal at the EG junction, interference from surrounding vascular structures needs to be considered. CT scan of the chest can be performed to evaluate for this    2. Normal Bolus transit on Impedance    Reference: (Interpretation is based on [place] classification, version 3.0). The [place] Classification of esophageal motility disorders, v3.0. Paul Dykes et.al. International High Resolution Manometry Working Group. Neurogastroenterol Motil. 2015;27(2):160-74.      Alferd Patee, MD  [facility].  [email or web address]  Phone: [phone]  Pager: [phone]  Cell: [phone].  [email or web address]

## 2017-02-09 NOTE — Other (Signed)
5cc 2% lidocaine jelly inhaled into right nare per MD orders.  Probe inserted into  right nare without difficulty.  Pt tolerated procedure well.

## 2017-02-09 NOTE — Procedures (Signed)
Procedures by  Alferd Patee, MD at 02/09/17 1030                Author: Alferd Patee, MD  Service: Gastroenterology  Author Type: Physician       Filed: 02/16/17 0503  Date of Service: 02/09/17 1030  Status: Addendum          Editor: Alferd Patee, MD (Physician)          Related Notes: Original Note by Alferd Patee, MD (Physician) filed at 02/16/17 0445            Procedures        1. FULL ESOPHAGEAL MANOMETRY [[id number]]                                   [email or web address]   [phone]      HIGH RESOLUTION ESOPHAGEAL MANOMETRY REPORT         Indication:    1. Esophageal dysphagia R 13.19      Date of procedure: 02/09/2017      Procedure:       After confirmation of potential allergies, a topical analgesic was used to numb the nares followed by trans-nasal insertion of a High Resolution Manometry Catheter. Pressure bands of both UES and LES were observed on the color contour. Patient instructed  to take deep breath to verify placement of catheter; diaphragmatic pinch noted on inspiration. Patient was assisted to supine position and catheter was stabilized by taping at the nares. Patient wasencouraged to relax while acclimating to catheter for  approximately 5 minutes. A 30 second baseline pressure was obtained to identify the UES and LES followed by a series of wet swallows using 5 ccs room temperature normal saline to assess esophageal motility and bolus transit. At the end of the study, a  multiple rapid swallow sequence was performed. At the conclusion of the procedure; the catheter was removed.         Findings:      1. Esophago-Gastric junction: There is incomplete swallow induced relaxation of the Esophago-gastric junction. The mean IRP is 47.7 mm Hg (normal is less than 15).Baseline pressure is also elevated at 51 mmHg.  There appears to be a strong vascular signal  in the area of the EG junction      2. Esophageal body:All swallows show normal intact contraction patterns and normal peristaltic vigour.  There is no suggestion of a hypercontractile contraction (Jackhammer esophagus) or distal esophageal spasm.  There is NO evidence of aperistalsis      3. Impedance: There is complete bolus clearance on 8 of the 10 swallows      Impression:      1. 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 This situation is sometimes seen with EoE  or with a stricture or a mechanical obstruction. Sometimes a hiatus hernia (sliding or paraesophageal) may present with this manometric configuration, though no clear hernia was seen on this study.      Rarely this pattern has been seen with early or incompletely expressed achalasia, with vascular obstruction of the distal esophagus, and with esophageal wall stiffness due to an infiltrative disease  or cancer.      Consider empiric dilation, especially if the patient has dysphagia. Consideration may also be done for mid and distal esophageal biopsies in the right setting. Consider Ba swallow to evaluate  the EG junction/Hiatus hernia with a complementary modality.       Given that peristalsis is preserved and with good bolus transit and normal intrabolus pressure at the E-G junction, this does not fit the criteria for Achalasia. However, rarely, this condition  may progress to Achalasia-Consider repeat manometry if dysphagia develops or significantly worsens in the future.      Given that there appears to be a vascular signal at the EG junction, interference from surrounding vascular structures needs to be considered. CT scan of the chest can be performed to evaluate for this      2. Normal Bolus transit on Impedance      Reference: (Interpretation is based on [place] classification, version 3.0). The [place] Classification of esophageal motility disorders, v3.0. Paul Dykes et.al. International High Resolution  Manometry Working Group. Neurogastroenterol Motil. 2015;27(2):160-74.          Alferd Patee, MD   [facility].   [email or web address]   Phone: [phone]   Pager: [phone]   Cell: [phone].   [email or web address]

## 2017-02-19 ENCOUNTER — Encounter

## 2017-02-25 ENCOUNTER — Inpatient Hospital Stay: Admit: 2017-02-25 | Payer: MEDICARE | Primary: Family Medicine

## 2017-02-25 ENCOUNTER — Inpatient Hospital Stay: Admit: 2017-02-25 | Payer: MEDICARE | Attending: Gastroenterology | Primary: Family Medicine

## 2017-02-25 ENCOUNTER — Ambulatory Visit

## 2017-02-25 DIAGNOSIS — I517 Cardiomegaly: Secondary | ICD-10-CM

## 2017-02-25 LAB — CBC WITH AUTOMATED DIFF
ABS. BASOPHILS: 0 10*3/uL (ref 0.0–0.1)
ABS. EOSINOPHILS: 0 10*3/uL (ref 0.0–0.4)
ABS. LYMPHOCYTES: 0.9 10*3/uL (ref 0.9–3.6)
ABS. MONOCYTES: 0.3 10*3/uL (ref 0.05–1.2)
ABS. NEUTROPHILS: 3.2 10*3/uL (ref 1.8–8.0)
BASOPHILS: 0 % (ref 0–2)
EOSINOPHILS: 0 % (ref 0–5)
HCT: 32.6 % — ABNORMAL LOW (ref 35.0–45.0)
HGB: 10.5 g/dL — ABNORMAL LOW (ref 12.0–16.0)
LYMPHOCYTES: 21 % (ref 21–52)
MCH: 29.4 PG (ref 24.0–34.0)
MCHC: 32.2 g/dL (ref 31.0–37.0)
MCV: 91.3 FL (ref 74.0–97.0)
MONOCYTES: 7 % (ref 3–10)
MPV: 12.4 FL — ABNORMAL HIGH (ref 9.2–11.8)
NEUTROPHILS: 72 % (ref 40–73)
PLATELET: 248 10*3/uL (ref 135–420)
RBC: 3.57 M/uL — ABNORMAL LOW (ref 4.20–5.30)
RDW: 15 % — ABNORMAL HIGH (ref 11.6–14.5)
WBC: 4.5 10*3/uL — ABNORMAL LOW (ref 4.6–13.2)

## 2017-02-25 LAB — METABOLIC PANEL, COMPREHENSIVE
A-G Ratio: 1.3 (ref 0.8–1.7)
ALT (SGPT): 41 U/L (ref 13–56)
AST (SGOT): 29 U/L (ref 15–37)
Albumin: 3.8 g/dL (ref 3.4–5.0)
Alk. phosphatase: 113 U/L (ref 45–117)
Anion gap: 8 mmol/L (ref 3.0–18)
BUN/Creatinine ratio: 25 — ABNORMAL HIGH (ref 12–20)
BUN: 21 MG/DL — ABNORMAL HIGH (ref 7.0–18)
Bilirubin, total: 1.1 MG/DL — ABNORMAL HIGH (ref 0.2–1.0)
CO2: 27 mmol/L (ref 21–32)
Calcium: 8.2 MG/DL — ABNORMAL LOW (ref 8.5–10.1)
Chloride: 109 mmol/L — ABNORMAL HIGH (ref 100–108)
Creatinine: 0.83 MG/DL (ref 0.6–1.3)
GFR est AA: >60 mL/min/{1.73_m2} (ref >60)
GFR est non-AA: >60 mL/min/{1.73_m2} (ref >60)
Globulin: 3 g/dL (ref 2.0–4.0)
Glucose: 86 mg/dL (ref 74–99)
Potassium: 5 mmol/L (ref 3.5–5.5)
Protein, total: 6.8 g/dL (ref 6.4–8.2)
Sodium: 144 mmol/L (ref 136–145)

## 2017-02-25 LAB — CREATININE, POC
Creatinine, POC: 0.9 MG/DL (ref 0.6–1.3)
GFRAA, POC: >60 mL/min/{1.73_m2} (ref >60)
GFRNA, POC: >60 mL/min/{1.73_m2} (ref >60)

## 2017-02-25 MED ORDER — IOPAMIDOL 76 % IV SOLN
370 mg iodine /mL (76 %) | Freq: Once | INTRAVENOUS | Status: AC
Start: 2017-02-25 — End: 2017-02-25
  Administered 2017-02-25: 17:00:00 via INTRAVENOUS

## 2017-02-25 MED FILL — ISOVUE-370  76 % INTRAVENOUS SOLUTION: 370 mg iodine /mL (76 %) | INTRAVENOUS | Qty: 80

## 2017-03-10 ENCOUNTER — Ambulatory Visit
Admit: 2017-03-10 | Discharge: 2017-03-10 | Payer: PRIVATE HEALTH INSURANCE | Attending: Family | Primary: Family Medicine

## 2017-03-10 DIAGNOSIS — R059 Cough, unspecified: Secondary | ICD-10-CM

## 2017-03-10 MED ORDER — LORATADINE 10 MG TAB
10 mg | ORAL_TABLET | Freq: Every day | ORAL | 0 refills | Status: DC
Start: 2017-03-10 — End: 2017-05-12

## 2017-03-10 MED ORDER — ALBUTEROL SULFATE HFA 90 MCG/ACTUATION AEROSOL INHALER
90 mcg/actuation | RESPIRATORY_TRACT | 0 refills | Status: DC | PRN
Start: 2017-03-10 — End: 2018-01-28

## 2017-03-10 MED ORDER — BENZONATATE 100 MG CAP
100 mg | ORAL_CAPSULE | Freq: Three times a day (TID) | ORAL | 0 refills | Status: AC | PRN
Start: 2017-03-10 — End: 2017-03-17

## 2017-03-10 NOTE — Progress Notes (Signed)
ROOM # 11    Cathy [name] presents today for   Chief Complaint   Patient presents with   ??? Cough       Cathy [name] preferred language for health care discussion is english/other.    Depression Screening:  PHQ over the last two weeks 03/10/2017 12/08/2016 07/17/2016 03/04/2016 08/02/2015 04/25/2015 04/24/2015   Little interest or pleasure in doing things Not at all Not at all Several days Not at all Not at all Not at all -   Feeling down, depressed, irritable, or hopeless Not at all Not at all Not at all Not at all Not at all Not at all Not at all   Total Score PHQ 2 0 0 1 0 0 0 -       Learning Assessment:  Learning Assessment 03/04/2016 04/11/2013 04/01/2012   PRIMARY LEARNER Patient Patient Patient   HIGHEST LEVEL OF EDUCATION - PRIMARY LEARNER  > 4 YEARS OF COLLEGE - > 4 YEARS Cathy [name] LEARNER NONE - [name] CAREGIVER No - No   PRIMARY LANGUAGE ENGLISH ENGLISH ENGLISH   LEARNER PREFERENCE PRIMARY DEMONSTRATION READING DEMONSTRATION   ANSWERED BY patient patient patient    RELATIONSHIP SELF SELF SELF       Abuse Screening:  Abuse Screening Questionnaire 03/04/2016 07/03/2015 05/24/2015 09/02/2013 09/15/2012   Do you ever feel afraid of your partner? N N N N N   Are you in a relationship with someone who physically or mentally threatens you? N N N N N   Is it safe for you to go home? [name] Y       Fall Risk  Fall Risk Assessment, last 12 mths 03/10/2017 12/08/2016 07/17/2016 03/04/2016 11/06/2015 08/02/2015 04/25/2015   Able to walk? Yes Yes Yes Yes Yes Yes Yes   Fall in past 12 months? No No No No No No No       Visit Vitals  BP 102/60 (BP 1 Location: Left arm, BP Patient Position: Sitting)   Pulse 87   Temp 98.1 ??F (36.7 ??C) (Oral)   Resp 16   Ht 5\' 10"  (1.778 m)   Wt 189 lb (85.7 kg)   SpO2 99%   BMI 27.12 kg/m??       Health Maintenance reviewed and discussed per provider. Yes    [name] is due for   Health Maintenance Due   Topic Date Due    ??? DTaP/Tdap/Td series (1 - Tdap) 04/12/1971   ??? Shingrix Vaccine Age 79> (1 of 2) 04/11/2000     Please order/place referral if appropriate.      Advance Directive:  1. Do you have an advance directive in place? Patient Reply: No    2. If not, would you like material regarding how to put one in place? Patient Reply: No    Coordination of Care:  1. Have you been to the ER, urgent care clinic since your last visit?  Hospitalized since your last visit? Yes

## 2017-03-10 NOTE — Progress Notes (Signed)
Cathy [name] is a 67 y.o. African American female and presents with    Chief Complaint   Patient presents with   ??? Cough       Subjective:  HPI  Cathy [name] presents today with complaints of nocturnal cough, reports up all night last night coughing that is causing chest pain. She reports ran out of Tessalon on Sunday and reports with relief while on it. She reports nausea has improved. She reports productive cough of brownish sputum mostly at night. Denies fevers, chills, n/v/d/c, she reports shortness of breath at baseline, weight is stable she denies fluctuations in weight at home.     She presented to the ED on 03/04/17 with similar complaints. Cardiac workup negative, chest xray non acute, VSS, cbc, cmp, proBNP normal range. She was diagnosed with viral URI.       Additional Concerns: none     ROS   Review of Systems   Constitutional: Negative.    HENT: Negative for congestion, sinus pain and sore throat.    Respiratory: Positive for cough, sputum production and shortness of breath. Negative for hemoptysis and wheezing.    Cardiovascular: Positive for chest pain.   Gastrointestinal: Negative.    Musculoskeletal: Negative.    Neurological: Negative for dizziness and headaches.       Allergies   Allergen Reactions   ??? Pcn [Penicillins] Rash and Itching     Itching bumps on hands and arms  Itching bumps on hands and arms   ??? Penicillamine Itching   ??? Tramadol Other (comments)     Psychotic Hallucinations  Psychotic Hallucinations       Current Outpatient Medications   Medication Sig Dispense Refill   ??? ondansetron (ZOFRAN ODT) 4 mg disintegrating tablet 4 mg.     ??? benzonatate (TESSALON) 100 mg capsule Take 1 Cap by mouth three (3) times daily as needed for Cough for up to 7 days. 30 Cap 0   ??? albuterol (PROVENTIL HFA, VENTOLIN HFA, PROAIR HFA) 90 mcg/actuation inhaler Take 1 Puff by inhalation every four (4) hours as needed for Wheezing. 1 Inhaler 0    ??? loratadine (CLARITIN) 10 mg tablet Take 1 Tab by mouth daily. 30 Tab 0   ??? furosemide (LASIX) 20 mg tablet Take 1 Tab by mouth two (2) times a day. 60 Tab 11   ??? meloxicam (MOBIC) 15 mg tablet Take 1 Tab by mouth daily (with breakfast). 30 Tab 1   ??? aspirin 81 mg chewable tablet Chew 1 tablet by mouth daily. 90 Tab 3   ??? carvedilol (COREG) 3.125 mg tablet TAKE ONE TABLET BY MOUTH TWICE A DAY 180 Tab 0   ??? atorvastatin (LIPITOR) 40 mg tablet Take 1 Tab by mouth daily. 90 Tab 3   ??? spironolactone (ALDACTONE) 25 mg tablet Take 25 mg by mouth daily.     ??? lisinopril (PRINIVIL, ZESTRIL) 5 mg tablet Take 1 Tab by mouth two (2) times a day. 180 Tab 1   ??? calcium-vitamin D (CALCIUM 600 + D,3,) 600 mg(1,53m) -200 unit tab Take 1 Tab by mouth two (2) times daily (with meals).     ??? fluticasone (FLONASE) 50 mcg/actuation nasal spray 2 Sprays by Both Nostrils route daily. (Patient taking differently: 2 Sprays by Both Nostrils route daily. As needed) 1 Bottle 5   ??? BABY ASPIRIN PO Take 81 mg by mouth daily.         Social History     Socioeconomic History   ???  Marital status: MARRIED     Spouse name: Not on file   ??? Number of children: Not on file   ??? Years of education: Not on file   ??? Highest education level: Not on file   Social Needs   ??? Financial resource strain: Not on file   ??? Food insecurity - worry: Not on file   ??? Food insecurity - inability: Not on file   ??? Transportation needs - medical: Not on file   ??? Transportation needs - non-medical: Not on file   Occupational History   ??? Occupation: home health aide   Tobacco Use   ??? Smoking status: Never Smoker   ??? Smokeless tobacco: Never Used   Substance and Sexual Activity   ??? Alcohol use: No   ??? Drug use: No   ??? Sexual activity: Yes     Partners: Male     Birth control/protection: Condom   Other Topics Concern   ??? Not on file   Social History Narrative   ??? Not on file       Past Medical History:   Diagnosis Date   ??? Automatic implantable cardiac defibrillator in situ      Post ddd icd Set up [facility]   ??? Baker cyst, left 12/2016   ??? Breast mass, left     benign   ??? CHF (congestive heart failure) ([place]) 06/23/2008    Non-ischemic CMP , Cath (2008) Normal coronary arteries   ??? Chronic systolic heart failure (HCC)     Stable,    ??? Degenerative arthritis of left knee    ??? Fibroid 06/23/2008   ??? History of brain tumor 2013   ??? HLD (hyperlipidemia)    ??? HTN (hypertension) 06/23/2008   ??? Hypotension, unspecified     Related to lisinopril   ??? 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 Left knee pain     With swelling   ??? Leg pain    ??? 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 OSA (obstructive sleep apnea) 9/10   ??? Venous reflux    ??? Wears glasses        Past Surgical History:   Procedure Laterality Date   ??? FULL ESOPHAGEAL MANOMETRY  02/16/2017        ??? HX BREAST BIOPSY      rt breast, benign   ??? HX CHOLECYSTECTOMY     ??? HX CRANIOTOMY  2013    meningioma    ??? HX HEENT      glasses   ??? HX HYSTERECTOMY     ??? HX IMPLANTABLE CARDIOVERTER DEFIBRILLATOR     ??? HX PACEMAKER  2013    dual chamber icd   ??? HX TONSIL AND ADENOIDECTOMY     ??? HX TOTAL ABDOMINAL HYSTERECTOMY  1996   ??? HX TUBAL LIGATION         Family History   Problem Relation Age of Onset   ??? Diabetes Mother    ??? Heart Attack Father         MI   ??? Heart Disease Maternal Grandmother    ??? Heart Disease Paternal Grandmother    ??? Kidney Disease Other         1 sib deceased   ??? Cancer Other         1 sib throat cancer   ??? Diabetes Other    ???  Diabetes Daughter    ??? Other Daughter         leukemia   ??? Hypertension Other      Objective:  Vitals:    03/10/17 1211   BP: 102/60   Pulse: 87   Resp: 16   Temp: 98.1 ??F (36.7 ??C)   TempSrc: Oral   SpO2: 99%   Weight: 189 lb (85.7 kg)   Height: 5' 10"  (1.778 m)   PainSc:   4   PainLoc: Chest       LABS   Results for orders placed or performed during the hospital encounter of 03/04/17   CBC   Result Value Ref Range   WBC x 10*3 5.9 4.0 - 11.0 K/uL    RBC x 10^6 3.39 (L) 3.80 - 5.20 M/uL   HGB 10.0 (L) 11.7 - 16.1 g/dL   HCT 31.6 (L) 35.1 - 48.3 %   MCV 93 80 - 95 fL   MCH 30 26 - 34 pg   MCHC 32 31 - 36 g/dL   RDW 14.6 10.0 - 15.5 %   Platelet 190 140 - 440 K/uL   MPV 12.2 9.0 - 13.0 fL   COMPREHENSIVE METABOLIC PANEL   Result Value Ref Range   Potassium 4.6 3.5 - 5.5 mmol/L   SODIUM 143 133 - 145 mmol/L   CHLORIDE 108 98 - 110 mmol/L   Glucose 103 (H) 70 - 99 mg/dL   CALCIUM 8.6 8.4 - 10.5 mg/dL   ALBUMIN 3.8 3.5 - 5.0 g/dL   SGPT (ALT) 44 (H) 5 - 40 U/L   SGOT (AST) 33 10 - 37 U/L   BILIRUBIN TOTAL 0.7 0.2 - 1.2 mg/dL   ALKALINE PHOSPHATASE 103 40 - 120 U/L   BUN 20 6 - 22 mg/dL   CO2 26 20 - 32 mmol/L   CREATININE 0.8 0.8 - 1.4 mg/dL   eGFR African American >60.0 >60.0   eGFR Non African American >60.0 >60.0   GLOBULIN SERUM 2.7 2.0 - 4.0 g/dL   A/G RATIO 1.4 1.1 - 2.6 ratio   TOTAL PROTEIN 6.5 6.2 - 8.1 g/dL   ANION GAP 9.8 mmol/L   TROPONIN   Result Value Ref Range   Troponin (T) Quantitative <0.01 0.00 - 0.01 ng/mL   NT PROBNP   Result Value Ref Range   NT proBNP 6,209 (H) <=125 pg/mL       TESTS  CHEST PA AND LATERAL1/10/2017  [facility]  Result Impression     1. Stable cardiomegaly with pulmonary vascular redistribution.  2. Nothing acute.      Signed By: Charlett Lango, MD on 03/04/2017 10:12 AM   Result Narrative   EXAM: CHEST PA AND LATERAL RADIOGRAPH    CLINICAL HISTORY/INDICATION: Dyspnea    COMPARISON: 06/29/2016.    TECHNIQUE: 2 view    FINDINGS:    Left subclavian dual-lead ICD with right atrial and right ventricular leads. No acute skeletal findings. No pleural fluid. The heart remains markedly enlarged but unchanged. Similar-appearing vascular redistribution. No pulmonary edema, pneumothorax or focal lung opacity.           PE  Physical Exam   Constitutional: She is oriented to person, place, and time. She appears well-developed and well-nourished. No distress.   HENT:   Head: Normocephalic and atraumatic.    Mouth/Throat: Oropharynx is clear and moist. No oropharyngeal exudate.   Edematous left nare, no drainage/exudate noted, mild erythema to posterior oropharnx   Eyes: Conjunctivae  and EOM are normal.   Neck: Normal range of motion.   Cardiovascular: Normal rate, regular rhythm and intact distal pulses.   Murmur heard.  Pulmonary/Chest: Effort normal and breath sounds normal. No respiratory distress. She has no wheezes. She has no rales. She exhibits no tenderness.   Dry cough noted on examination, no cough reproduced with deep breathing   Abdominal: Soft. Bowel sounds are normal.   Musculoskeletal: Normal range of motion.   Lymphadenopathy:     She has no cervical adenopathy.   Neurological: She is alert and oriented to person, place, and time.   Skin: Skin is warm and dry. She is not diaphoretic.   Psychiatric: She has a normal mood and affect. Her behavior is normal. Judgment and thought content normal.   Vitals reviewed.      Assessment/Plan:    1. Viral uri with cough/costochondritis- Nasal saline rinse, flonase, claritin, Tessalon, Robitussin, Tylenol, Mobic is on her profile. Albuterol for cough/bronchospasm. Monitor for CHF exacerbation symptoms as discussed, follow up if new/worsening/persistent symptoms.     Lab review: no lab studies available for review at time of visit    Today's Visit:   Diagnoses and all orders for this visit:    1. Cough  -     benzonatate (TESSALON) 100 mg capsule; Take 1 Cap by mouth three (3) times daily as needed for Cough for up to 7 days.  -     albuterol (PROVENTIL HFA, VENTOLIN HFA, PROAIR HFA) 90 mcg/actuation inhaler; Take 1 Puff by inhalation every four (4) hours as needed for Wheezing.  -     loratadine (CLARITIN) 10 mg tablet; Take 1 Tab by mouth daily.    2. Viral URI with cough  -     benzonatate (TESSALON) 100 mg capsule; Take 1 Cap by mouth three (3) times daily as needed for Cough for up to 7 days.   -     albuterol (PROVENTIL HFA, VENTOLIN HFA, PROAIR HFA) 90 mcg/actuation inhaler; Take 1 Puff by inhalation every four (4) hours as needed for Wheezing.  -     loratadine (CLARITIN) 10 mg tablet; Take 1 Tab by mouth daily.      Health Maintenance: Deferred to PCP.     I have discussed the diagnosis with the patient and the intended plan as seen in the above orders.  The patient has received an after-visit summary and questions were answered concerning future plans.  I have discussed medication side effects and warnings with the patient as well. I have reviewed the plan of care with the patient, accepted their input and they are in agreement with the treatment goals.       Follow-up Disposition: Not on File   More than 1/2 of this 15 minute visit was spent in counseling and coordination of care, as described above.    Wyline Beady, FNP-C  [facility]  [address] [place] [address]  Phone: [phone]  Fax: [phone]

## 2017-03-12 NOTE — Telephone Encounter (Signed)
PT needs a referral to ob/gyn- she is having  a heart pump ( Dr Patrick Jupiter Old) put in and she needs a total work up per Dr Dorene Ar

## 2017-03-12 NOTE — Telephone Encounter (Signed)
Patient contacted, patient identified with two identifiers (Name & DOB).  Patient given contact info to [facility] to make her own appointment.

## 2017-03-17 ENCOUNTER — Ambulatory Visit
Admit: 2017-03-17 | Discharge: 2017-03-17 | Payer: PRIVATE HEALTH INSURANCE | Attending: Family Medicine | Primary: Family Medicine

## 2017-03-17 DIAGNOSIS — E042 Nontoxic multinodular goiter: Secondary | ICD-10-CM

## 2017-03-17 NOTE — Progress Notes (Signed)
Chief Complaint   Patient presents with   ??? Cough     follow up       1. Have you been to the ER, urgent care clinic since your last visit?  Hospitalized since your last visit?Yes When: 03-04-17 Where: [facility] Reason: SOB, Nausea    2. Have you seen or consulted any other health care providers outside of the [place] since your last visit?  Include any pap smears or colon screening. No    Patient was given a copy of the Advanced Directive and understands to bring it in once completed.  Health Maintenance Due   Topic Date Due   ??? DTaP/Tdap/Td series (1 - Tdap) 04/12/1971   ??? Shingrix Vaccine Age 79> (1 of 2) 04/11/2000

## 2017-03-17 NOTE — Patient Instructions (Addendum)
Patient was given a copy of the Advanced Medical Directive Form, and understands to bring it in once completed.  Health Maintenance Due   Topic Date Due   ??? DTaP/Tdap/Td series (1 - Tdap) 04/12/1971   ??? Shingrix Vaccine Age 67> (1 of 2) 04/11/2000          Learning About Swallowing Problems  What are swallowing problems?    Certain health problems that affect the nervous system can cause trouble swallowing. These conditions include stroke, ALS (also known as Orthoptist disease), Parkinson's disease, and multiple sclerosis. The muscles and nerves that help move food through the throat and esophagus may not work right. Growths, such as cancer, and other problems with your esophagus can also make it hard to swallow. The esophagus is the tube that leads from your throat to your stomach.  How are swallowing problems diagnosed?  A doctor or speech therapist will examine you to check for swallowing problems. You may get swallowing tests to check how well your throat muscles work. For these tests, you swallow a special liquid that helps the doctor see your throat and esophagus on an X-ray or video screen.  Other tests use a thin, flexible tube called a scope to check for problems with your esophagus. The doctor puts the scope in your mouth and down your throat to look at your esophagus.  What are the symptoms?  Symptoms of swallowing problems may include:  ?? Trouble getting food or liquids to go down on the first try.  ?? Gagging, choking, or coughing when you swallow.  ?? Having food or liquids come back up through your throat, mouth, or nose after you swallow.  ?? Feeling like foods or liquids are stuck in some part of your throat or chest.  ?? Pain when you swallow.  How are swallowing problems treated?  How swallowing problems are treated depends on the cause. The main goals of treatment will be to help you eat and swallow safely and get good nutrition. This is important for your health and quality of life.   You may learn exercises to train your throat muscles to work together so you're able to swallow better. Learning certain ways to put food in your mouth or to position your head while eating may also help.  Your doctor or a speech therapist may recommend changes to your diet to help make it easier to swallow. You may need to avoid certain foods or liquids. You also may need to change the thickness of foods or liquids in your diet.  To eat and swallow safely, follow any instructions you get from your doctor or therapist. These ideas may help:  ?? Sit upright when eating, drinking, and taking pills.  ?? Take small bites of food. Chew completely and swallow before taking another bite.  ?? Take small sips of liquids.  ?? If eating makes you tired, eat smaller but more frequent meals.  ?? Tip your chin down when there is food in your mouth.  Where can you learn more?  Go to [employer].  Enter [phone] in the search box to learn more about "Learning About Swallowing Problems."  Current as of: November 16, 2016  Content Version: 11.9  ?? 2006-2018 [employer]. Care instructions adapted under license by [employer] (which disclaims liability or warranty for this information). If you have questions about a medical condition or this instruction, always ask your healthcare professional. [place] any warranty or liability for your use  of this information.

## 2017-03-17 NOTE — ACP (Advance Care Planning) (Signed)
Patient was given a copy of the Advanced Medical Directive Form, and understands to bring it in once completed.

## 2017-03-17 NOTE — Progress Notes (Signed)
[facility]:  03/17/2017, MRN: [id number]      Cathy [name] is a 67 y.o. 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    1.  Cough: The patient was seen earlier this month with complaint of cough at our office.  Prior to this visit, she was seen in the emergency department at which time she was diagnosed with a viral respiratory tract infection.  She was treated with benzonatate.  She had improvement in her cough symptoms with benzonatate.  At her last appointment a week ago, she was prescribed benzonatate for symptomatic relief given her reassuring physical exam findings, that were consistent with a viral respiratory tract infection.  Today, she states that her cough has resolved.     2.  Multinodular Goiter: Her last ultrasound was from January 2018.  Since then, her ENT team told her to RTC in 6 months but no additional studies have been ordered.     03/07/16 Thyroid Ultrasound: Suspicious nodules in the left thyroid lobe for which FNA sampling is recommended for histologic diagnosis.    3.  Dysphagia: S/p EGD last month. She is suspected to have achalasia vs eosinophilic esophagitis given EGD findings showing: esophago-gastric junction outflow obstruction with normal peristalsis per the GI records. She is followed by Dr.Iyer. She stopped all NSAIDs except aspirin. She is taking PPI rx. She is scheduled for a colonoscopy.     4.  CHF: EF is 10%.  Status post AICD.  Followed by [facility].   BP is 91/57.  She has no changes to chronic chest pain and shortness of breath, which is followed by her Cardiology team.  She takes Lasix, aspirin, Coreg, Lipitor, Aldactone, and lisinopril.      Patient Active Problem List    Diagnosis Date Noted   ??? Age-related osteoporosis without current pathological fracture 03/04/2016   ??? Multinodular goiter 03/04/2016   ??? History of meningioma 03/04/2016   ??? Non-rheumatic mitral regurgitation 04/25/2014   ??? AR (allergic rhinitis) 09/29/2011   ??? Chronic systolic heart failure ([place])    ??? Automatic implantable cardioverter-defibrillator in situ    ??? Mixed hyperlipidemia    ??? AICD (automatic cardioverter/defibrillator) present 11/15/2010   ??? Cataract 12/01/2008   ??? Gallstones 11/13/2008   ??? Insomnia 11/13/2008   ??? OSA (obstructive sleep apnea) 11/13/2008   ??? Fibroid 06/23/2008   ??? HTN (hypertension) 06/23/2008       Current Outpatient Medications   Medication Sig Dispense Refill   ??? benzonatate (TESSALON) 100 mg capsule Take 1 Cap by mouth three (3) times daily as needed for Cough for up to 7 days. 30 Cap 0   ??? albuterol (PROVENTIL HFA, VENTOLIN HFA, PROAIR HFA) 90 mcg/actuation inhaler Take 1 Puff by inhalation every four (4) hours as needed for Wheezing. 1 Inhaler 0   ??? loratadine (CLARITIN) 10 mg tablet Take 1 Tab by mouth daily. 30 Tab 0   ??? furosemide (LASIX) 20 mg tablet Take 1 Tab by mouth two (2) times a day. 60 Tab 11   ??? aspirin 81 mg chewable tablet Chew 1 tablet by mouth daily. [place]  Tab 3   ??? carvedilol (COREG) 3.125 mg tablet TAKE ONE TABLET BY MOUTH TWICE A DAY 180 Tab 0   ??? atorvastatin (LIPITOR) 40 mg tablet Take 1 Tab by mouth daily. 90 Tab 3   ??? spironolactone (ALDACTONE) 25 mg tablet Take 25 mg by mouth daily.     ??? lisinopril (PRINIVIL, ZESTRIL) 5 mg tablet Take 1 Tab by mouth two (2) times a day. 180 Tab 1   ??? calcium-vitamin D (CALCIUM 600 + D,3,) 600 mg(1,500mg ) -200 unit tab Take 1 Tab by mouth two (2) times daily (with meals).      ??? fluticasone (FLONASE) 50 mcg/actuation nasal spray 2 Sprays by Both Nostrils route daily. (Patient taking differently: 2 Sprays by Both Nostrils route daily. As needed) 1 Bottle 5       Allergies   Allergen Reactions   ??? Penicillamine Itching   ??? Penicillins Rash and Itching     Itching bumps on hands and arms  Itching bumps on hands and arms  Itching bumps on hands and arms  Has patient had a PCN reaction causing immediate rash, facial/tongue/throat swelling, SOB or lightheadedness with hypotension: Yes  Has patient had a PCN reaction causing severe rash involving mucus membranes or skin necrosis: No  Has patient had a PCN reaction that required hospitalization unknown  Has patient had a PCN reaction occurring within the last 10 years: No  If all of the above answers are "NO", then may proceed with Cephalosporin use.     ??? Tramadol Other (comments)     Psychotic Hallucinations  Psychotic Hallucinations  hallucinations       Past Medical History:   Diagnosis Date   ??? Automatic implantable cardiac defibrillator in situ     Post ddd icd Set up carelink   ??? Baker cyst, left 12/2016   ??? Breast mass, left     benign   ??? CHF (congestive heart failure) ([place]) 06/23/2008    Non-ischemic CMP , Cath (2008) Normal coronary arteries   ??? Chronic systolic heart failure (H[facility])     Stable,    ??? Degenerative arthritis of left knee    ??? Fibroid 06/23/2008   ??? History of brain tumor 2013   ??? HLD (hyperlipidemia)    ??? HTN (hypertension) 06/23/2008   ??? Hypotension, unspecified     Related to lisinopril   ??? 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 Left knee pain     With swelling   ??? Leg pain    ??? 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 OSA (obstructive sleep apnea) 9/10   ??? Venous reflux    ??? Wears glasses        Past Surgical History:   Procedure Laterality Date   ??? FULL ESOPHAGEAL MANOMETRY  02/16/2017        ??? HX BREAST BIOPSY      rt breast, benign   ??? HX CHOLECYSTECTOMY      ??? HX CRANIOTOMY  2013    meningioma    ??? HX HEENT      glasses   ??? HX HYSTERECTOMY     ??? HX IMPLANTABLE CARDIOVERTER DEFIBRILLATOR     ??? HX PACEMAKER  2013    dual chamber icd   ??? HX TONSIL AND ADENOIDECTOMY     ??? HX TOTAL ABDOMINAL HYSTERECTOMY  1996   ??? HX TUBAL LIGATION  Family History   Problem Relation Age of Onset   ??? Diabetes Mother    ??? Heart Attack Father         MI   ??? Heart Disease Maternal Grandmother    ??? Heart Disease Paternal Grandmother    ??? Kidney Disease Other         1 sib deceased   ??? Cancer Other         1 sib throat cancer   ??? Diabetes Other    ??? Diabetes Daughter    ??? Other Daughter         leukemia   ??? Hypertension Other        Social History     Tobacco Use   ??? Smoking status: Never Smoker   ??? Smokeless tobacco: Never Used   Substance Use Topics   ??? Alcohol use: No       ROS   Review of Systems   Constitutional: Negative for chills and fever.   HENT: Negative for ear pain and sore throat.    Eyes: Negative for blurred vision and pain.   Respiratory: Positive for shortness of breath (chronic, unchanged). Negative for cough.    Cardiovascular: Positive for chest pain (chronic, none today).   Gastrointestinal: Negative for abdominal pain, blood in stool and melena.   Genitourinary: Negative for dysuria and hematuria.   Musculoskeletal: Negative for joint pain and myalgias.   Skin: Negative for rash.   Neurological: Negative for tingling, focal weakness and headaches.   Endo/Heme/Allergies: Does not bruise/bleed easily.   Psychiatric/Behavioral: Negative for substance abuse.     Objective     Vitals:    03/17/17 0951   BP: 91/57   Pulse: 93   Resp: 14   Temp: 96 ??F (35.6 ??C)   TempSrc: Oral   SpO2: 99%   Weight: 181 lb (82.1 kg)   Height: 5\' 10"  (1.778 m)   PainSc:   0 - No pain       Physical Exam   Constitutional: She is oriented to person, place, and time and well-developed, well-nourished, and in no distress.   HENT:   Head: Normocephalic and atraumatic.    Right Ear: External ear normal.   Left Ear: External ear normal.   Nose: Nose normal.   Mouth/Throat: Oropharynx is clear and moist. No oropharyngeal exudate.   Eyes: Conjunctivae and EOM are normal. Pupils are equal, round, and reactive to light. Right eye exhibits no discharge. Left eye exhibits no discharge. No scleral icterus.   Neck: Neck supple.   Cardiovascular: Normal rate, regular rhythm and intact distal pulses.   No murmur heard.  Pulmonary/Chest: Effort normal and breath sounds normal. No respiratory distress. She has no wheezes. She has no rales.   Abdominal: Soft. Bowel sounds are normal. She exhibits no distension. There is no tenderness. There is no rebound and no guarding.   Musculoskeletal: She exhibits no edema or tenderness (BUE).   Lymphadenopathy:     She has no cervical adenopathy.   Neurological: She is alert and oriented to person, place, and time. She exhibits normal muscle tone. Gait normal.   Skin: Skin is warm and dry. No erythema.   Psychiatric: Affect normal.   Nursing note and vitals reviewed.      LABS   Data Review:   Lab Results   Component Value Date/Time    WBC 4.5 (L) 02/25/2017 11:30 AM    Hemoglobin, POC 11.2 (L) 01/22/2017 01:08 PM  HGB 10.5 (L) 02/25/2017 11:30 AM    Hematocrit, POC 33 (L) 01/22/2017 01:08 PM    HCT 32.6 (L) 02/25/2017 11:30 AM    PLATELET 248 02/25/2017 11:30 AM    MCV 91.3 02/25/2017 11:30 AM       Lab Results   Component Value Date/Time    Sodium 144 02/25/2017 11:30 AM    Potassium 5.0 02/25/2017 11:30 AM    Chloride 109 (H) 02/25/2017 11:30 AM    CO2 27 02/25/2017 11:30 AM    Anion gap 8 02/25/2017 11:30 AM    Glucose 86 02/25/2017 11:30 AM    BUN 21 (H) 02/25/2017 11:30 AM    Creatinine 0.83 02/25/2017 11:30 AM    BUN/Creatinine ratio 25 (H) 02/25/2017 11:30 AM    GFR est AA >60 02/25/2017 11:30 AM    GFR est non-AA >60 02/25/2017 11:30 AM    Calcium 8.2 (L) 02/25/2017 11:30 AM       Lab Results   Component Value Date/Time     Cholesterol, total 161 04/09/2015 08:11 AM    HDL Cholesterol 73 (H) 04/09/2015 08:11 AM    LDL, calculated 79.8 04/09/2015 08:11 AM    VLDL, calculated 8.2 04/09/2015 08:11 AM    Triglyceride 41 04/09/2015 08:11 AM    CHOL/HDL Ratio 2.2 04/09/2015 08:11 AM       Lab Results   Component Value Date/Time    Hemoglobin A1c 5.8 12/09/2012 10:50 AM       Assessment/Plan:   1. Multinodular goiter  ???Ordering a thyroid ultrasound for completeness.    ORDERS:  - US THYROID/PARATHYROID/SOFT TISS; Future    2. Dysphasia: S/p dilation.   ???I encouraged her to follow-up with GI for treatment and her anticipated colonoscopy.    3.  CHF/Hypertension: Stable.  ???I encouraged her to get her LVAD per her Cardiology team's recommendations. I answered questions per her request.    4.  Viral Respiratory Tract Infection: Resolved.  Physical exam findings are reassuring!  Observation.      Health Maintenance Due   Topic Date Due   ??? DTaP/Tdap/Td series (1 - Tdap) 04/12/1971   ??? Shingrix Vaccine Age 63> (1 of 2) 04/11/2000     Lab review: labs are reviewed in the EHR    I have discussed the diagnosis with the patient and the intended plan as seen in the above orders.  The patient has received an after-visit summary and questions were answered concerning future plans.  I have discussed medication side effects and warnings with the patient as well. I have reviewed the plan of care with the patient, accepted their input and they are in agreement with the treatment goals. All questions were answered. The patient understands the plan of care. Handouts provided today with above information. Pt instructed if symptoms worsen to call the office or report to the ED for continued care.  Greater than 50% of the visit time was spent in counseling and/or coordination of care.      Voice recognition was used to generate this report, which may have resulted in some phonetic based errors in grammar and contents. Even  though attempts were made to correct all the mistakes, some may have been missed, and remained in the body of the document.    Leo Rod, MD

## 2017-03-25 ENCOUNTER — Inpatient Hospital Stay: Admit: 2017-03-25 | Payer: MEDICARE | Attending: Family Medicine | Primary: Family Medicine

## 2017-03-25 ENCOUNTER — Ambulatory Visit: Payer: MEDICARE | Primary: Family Medicine

## 2017-03-25 ENCOUNTER — Ambulatory Visit

## 2017-03-25 DIAGNOSIS — E042 Nontoxic multinodular goiter: Secondary | ICD-10-CM

## 2017-03-26 NOTE — Telephone Encounter (Signed)
Patient called returning [place] call.

## 2017-03-26 NOTE — Telephone Encounter (Signed)
LVM for the patient to return my call regarding their results.

## 2017-03-26 NOTE — Progress Notes (Signed)
The Radiologist sees two left sided thyroid nodules - which are chronic. Nevertheless, the Radiologist is recommending that she get a f/u study in 1 year. Please let her know these results. No additional studies are warranted this year for these findings.    Dr. Marius Ditch  [facility], [place] [address]  Phone: [phone]  Fax: [phone]

## 2017-03-26 NOTE — Telephone Encounter (Signed)
-----   Message from Leo Rod, MD sent at 03/26/2017  9:54 AM EST -----  The Radiologist sees two left sided thyroid nodules - which are chronic. Nevertheless, the Radiologist is recommending that she get a f/u study in 1 year. Please let her know these results. No additional studies are warranted this year for these findings.    Dr. Marius Ditch  [facility], [place] [address]  Phone: [phone]  Fax: [phone]

## 2017-03-27 NOTE — Telephone Encounter (Signed)
Patient aware of message below, she verbalized understanding. She asked we send the ultrasound results to Dr Patrick Jupiter Old, her cardiologist at [facility] but I cannot find his contact information?  Does she see another cardiologist maybe?

## 2017-03-27 NOTE — Telephone Encounter (Signed)
PT RETUNING NURSE CALL

## 2017-04-17 MED ORDER — LISINOPRIL 5 MG TAB
5 mg | ORAL_TABLET | ORAL | 0 refills | Status: DC
Start: 2017-04-17 — End: 2017-05-12

## 2017-05-12 ENCOUNTER — Ambulatory Visit
Admit: 2017-05-12 | Discharge: 2017-05-12 | Payer: PRIVATE HEALTH INSURANCE | Attending: Internal Medicine | Primary: Family Medicine

## 2017-05-12 DIAGNOSIS — I5022 Chronic systolic (congestive) heart failure: Secondary | ICD-10-CM

## 2017-05-12 MED ORDER — SPIRONOLACTONE 25 MG TAB
25 mg | ORAL_TABLET | Freq: Every day | ORAL | 3 refills | Status: DC
Start: 2017-05-12 — End: 2018-05-09

## 2017-05-12 NOTE — ACP (Advance Care Planning) (Signed)
Patient was given a copy of the Advanced Medical Directive Form, and understands to bring it in once completed.

## 2017-05-12 NOTE — Progress Notes (Signed)
Cathy [name],born 14-Aug-1950, is a 67 y.o. female, who is seen today for reevaluation after recent hospitalization for ongoing systolic heart failure mitral valve disease and now she has an assist device.  She feels a little lightheaded with walking but no falls.  She has dyspnea with any exertion which is little better than before she went in the hospital.  No PND orthopnea or edema.  She is taking all of her medicine correctly.  She is now on 25 mg of spironolactone instead of 12-1/2 and aspirin was increased to 325 mg daily.    Past Medical History:   Diagnosis Date   ??? Automatic implantable cardiac defibrillator in situ     Post ddd icd Set up carelink   ??? Baker cyst, left 12/2016   ??? Breast mass, left     benign   ??? CHF (congestive heart failure) ([place]) 06/23/2008    Non-ischemic CMP , Cath (2008) Normal coronary arteries   ??? Chronic systolic heart failure ([facility])     Stable,    ??? Degenerative arthritis of left knee    ??? Fibroid 06/23/2008   ??? History of brain tumor 2013   ??? HLD (hyperlipidemia)    ??? HTN (hypertension) 06/23/2008   ??? Hypotension, unspecified     Related to lisinopril   ??? 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 Left knee pain     With swelling   ??? Leg pain    ??? 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 OSA (obstructive sleep apnea) 9/10   ??? Venous reflux    ??? Wears glasses      Current Outpatient Medications   Medication Sig Dispense Refill   ??? aspirin (ASPIRIN) 325 mg tablet Take 325 mg by mouth daily.     ??? oxyCODONE-acetaminophen (PERCOCET) 5-325 mg per tablet Take 1 Tab by mouth every four (4) hours as needed.     ??? trimethoprim-polymyxin b (POLYTRIM) ophthalmic solution Apply 4 Drops to affected area daily. Applied to skin area as directed     ??? warfarin (COUMADIN) 1 mg tablet 3mg  daily     ??? bisacodyl (DULCOLAX) 5 mg EC tablet Take 5 mg by mouth daily as needed.      ??? ondansetron (ZOFRAN ODT) 4 mg disintegrating tablet Take 4 mg by mouth every eight (8) hours as needed.     ??? polyethylene glycol (MIRALAX) 17 gram packet Take 17 g by mouth daily as needed.     ??? melatonin 3 mg tablet Take 6 mg by mouth nightly.     ??? atorvastatin (LIPITOR) 40 mg tablet Take 40 mg by mouth daily.     ??? latanoprost (XALATAN) 0.005 % ophthalmic solution Administer 1 Drop to both eyes nightly.     ??? timolol (TIMOPTIC-XE) 0.5 % ophthalmic gel-forming Administer 1 Drop to both eyes daily.     ??? calcium carbonate (OS-CAL) 500 mg calcium (1,250 mg) tablet Take 1,250 mg by mouth daily.     ??? spironolactone (ALDACTONE) 25 mg tablet Take 1 Tab by mouth daily. 90 Tab 3   ??? albuterol (PROVENTIL HFA, VENTOLIN HFA, PROAIR HFA) 90 mcg/actuation inhaler Take 1 Puff by inhalation every four (4) hours as needed for Wheezing. 1 Inhaler 0   ??? fluticasone (FLONASE) 50 mcg/actuation nasal spray 2 Sprays by Both Nostrils route daily. (Patient taking differently: 2 Sprays by Both Nostrils route daily. As  needed) 1 Bottle 5     Visit Vitals  Temp 97.8 ??F (36.6 ??C) (Oral)   Resp 14   Ht 5\' 10"  (1.778 m)   Wt 158 lb 6.4 oz (71.8 kg)   SpO2 100%   BMI 22.73 kg/m??     Carotids are 2+ without bruits.  No JVD or HJR.  Lungs are clear to percussion.  Good breath sounds with no wheezing or crackles.  Auscultation of the heart reveals a continuous mechanical humming sound and no heart tones are heard.  Abdomen is soft and nontender with no obvious hepatosplenomegaly or masses and no bruits.  Extremities reveal no clubbing cyanosis or edema.  Pulses are intact.    Assessment: Chronic systolic heart failure with ejection fraction about 10% now with an assist device, she is better from respiratory standpoint.  #2.  History of hypertension no longer an issue.  #3.  Mitral regurgitation, with her new assist device heart tones are not heard.  We will leave that to the specialist at the heart hospital.     Follow-up as needed with her regular primary care physician.    Vonnie Spagnolo R. Celesta Aver, MD FACP    Please note:  This document has been produced using voice recognition software. Unrecognized errors in transcription may be present.

## 2017-05-12 NOTE — Patient Instructions (Signed)
Patient was given a copy of the Advanced Medical Directive Form, and understands to bring it in once completed.  Health Maintenance Due   Topic Date Due   ??? DTaP/Tdap/Td series (1 - Tdap) 04/12/1971   ??? Shingrix Vaccine Age 67> (1 of 2) 04/11/2000

## 2017-05-12 NOTE — Progress Notes (Signed)
Chief Complaint   Patient presents with   ??? Surgical Follow-up     [facility] admission 03-27-17 with Surgery on 04-06-17 Insertion LVAD follow up    on Room 8     Patient is seeing [facility] Cardiology Team for Protime/INR Coumadin Therapy.  1. Have you been to the ER, urgent care clinic since your last visit?  Hospitalized since your last visit?Yes When: 03-27-16  Where: [facility] Reason:  Admission with Insertion of LVAD on 04-06-17.  2. Have you seen or consulted any other health care providers outside of the [facility] since your last visit?  Include any pap smears or colon screening. No    Patient was given a copy of the Advanced Directive and understands to bring it in once completed.  Health Maintenance Due   Topic Date Due   ??? DTaP/Tdap/Td series (1 - Tdap) 04/12/1971   ??? Shingrix Vaccine Age 82> (1 of 2) 04/11/2000

## 2017-07-16 LAB — HM COLONOSCOPY

## 2017-07-22 MED ORDER — DENOSUMAB 60 MG/ML SUB-Q SYRINGE
60 mg/mL | Freq: Once | SUBCUTANEOUS | Status: DC
Start: 2017-07-22 — End: 2017-07-24

## 2017-07-22 MED FILL — PROLIA 60 MG/ML SUBCUTANEOUS SYRINGE: 60 mg/mL | SUBCUTANEOUS | Qty: 1

## 2017-07-24 ENCOUNTER — Inpatient Hospital Stay: Payer: MEDICARE | Primary: Family Medicine

## 2017-08-05 ENCOUNTER — Ambulatory Visit: Admit: 2017-08-05 | Discharge: 2017-08-05 | Payer: MEDICARE | Attending: Family Medicine | Primary: Family Medicine

## 2017-08-05 DIAGNOSIS — I5022 Chronic systolic (congestive) heart failure: Secondary | ICD-10-CM

## 2017-08-05 NOTE — Progress Notes (Signed)
Chief Complaint   Patient presents with   ??? Annual Wellness Visit     ROOM  2       1. Have you been to the ER, urgent care clinic since your last visit?  Hospitalized since your last visit? Yes. When: 07-15-17 Where: [facility] Reason: Heart replaced by heart assist device    2. Have you seen or consulted any other health care providers outside of the [facility] since your last visit?  Include any pap smears or colon screening. No    Patient was given a copy of the Advanced Directive and understands to bring it in once completed.  Health Maintenance Due   Topic Date Due   ??? DTaP/Tdap/Td series (1 - Tdap) 04/12/1971   ??? Shingrix Vaccine Age 59> (1 of 2) 04/11/2000   ??? MEDICARE YEARLY EXAM  07/18/2017     This is the Subsequent Medicare Annual Wellness Exam, performed 12 months or more after the Initial AWV or the last Subsequent AWV    I have reviewed the patient's medical history in detail and updated the computerized patient record.     History   The patient is a 67 year old female with history of hypertension, chronic systolic heart failure??(followed by Dr.McCray) with EF of 10%, mitral regurgitation, cholelithiasis, multinodular goiter, obstructive sleep apnea??(not on rx), allergic rhinitis, osteoporosis, uterine fibroids per EHR, insomnia, cataracts,??dysphagia from achalasia?,??LVAD placement in 2019, AICD placement,??left Baker's cyst,??hyperlipidemia, and meningioma status post resection.    Health Maintenance History  Immunizations reviewed:   Tdap over-due   Pneumovax: up to date   Flu: flu season is over at present  Zoster: over-due    Immunization History   Administered Date(s) Administered   ??? Influenza High Dose Vaccine PF 02/06/2016   ??? Influenza Vaccine 11/27/2008, 12/19/2009, 11/11/2011, 01/27/2013, 01/27/2013, 02/27/2014, 02/06/2016, 11/12/2016   ??? Influenza Vaccine (Quad) PF 02/27/2014   ??? Influenza Vaccine (Tri) Adjuvanted 11/12/2016   ??? Influenza Vaccine PF 11/11/2011    ??? Influenza Vaccine Split 11/27/2008, 12/19/2009   ??? Pneumococcal Conjugate (PCV-13) 04/25/2015   ??? Pneumococcal Polysaccharide (PPSV-23) 04/25/2015, 11/27/2016   ??? TB Skin Test (PPD) 03/31/2017     Colonoscopy: Up to date   Eye exam: Up to date  Mammo: Up to date. No breast pain/masses.  Dexascan: Up to date  Pelvic/Pap: No vaginal bleeding.        Past Medical History:   Diagnosis Date   ??? Automatic implantable cardiac defibrillator in situ     Post ddd icd Set up carelink   ??? Baker cyst, left 12/2016   ??? Breast mass, left     benign   ??? CHF (congestive heart failure) ([place]) 06/23/2008    Non-ischemic CMP , Cath (2008) Normal coronary arteries   ??? Chronic systolic heart failure (HCC)     Stable,    ??? Degenerative arthritis of left knee    ??? Fibroid 06/23/2008   ??? History of brain tumor 2013   ??? HLD (hyperlipidemia)    ??? HTN (hypertension) 06/23/2008   ??? Hypotension, unspecified     Related to lisinopril   ??? 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 Left knee pain     With swelling   ??? Leg pain    ??? 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 OSA (obstructive sleep apnea) 9/10   ??? Venous reflux    ???  Wears glasses       Past Surgical History:   Procedure Laterality Date   ??? FULL ESOPHAGEAL MANOMETRY  02/16/2017        ??? HX BREAST BIOPSY      rt breast, benign   ??? HX CHOLECYSTECTOMY     ??? HX CRANIOTOMY  2013    meningioma    ??? HX HEENT      glasses   ??? HX HYSTERECTOMY     ??? HX IMPLANTABLE CARDIOVERTER DEFIBRILLATOR     ??? HX PACEMAKER  2013    dual chamber icd   ??? HX TONSIL AND ADENOIDECTOMY     ??? HX TOTAL ABDOMINAL HYSTERECTOMY  1996   ??? HX TUBAL LIGATION       Current Outpatient Medications   Medication Sig Dispense Refill   ??? aspirin (ASPIRIN) 325 mg tablet Take 325 mg by mouth daily.     ??? oxyCODONE-acetaminophen (PERCOCET) 5-325 mg per tablet Take 1 Tab by mouth every four (4) hours as needed.      ??? trimethoprim-polymyxin b (POLYTRIM) ophthalmic solution Apply 4 Drops to affected area daily. Applied to skin area as directed     ??? warfarin (COUMADIN) 1 mg tablet 3mg  daily     ??? bisacodyl (DULCOLAX) 5 mg EC tablet Take 5 mg by mouth daily as needed.     ??? ondansetron (ZOFRAN ODT) 4 mg disintegrating tablet Take 4 mg by mouth every eight (8) hours as needed.     ??? polyethylene glycol (MIRALAX) 17 gram packet Take 17 g by mouth daily as needed.     ??? melatonin 3 mg tablet Take 6 mg by mouth nightly.     ??? atorvastatin (LIPITOR) 40 mg tablet Take 40 mg by mouth daily.     ??? latanoprost (XALATAN) 0.005 % ophthalmic solution Administer 1 Drop to both eyes nightly.     ??? timolol (TIMOPTIC-XE) 0.5 % ophthalmic gel-forming Administer 1 Drop to both eyes daily.     ??? calcium carbonate (OS-CAL) 500 mg calcium (1,250 mg) tablet Take 1,250 mg by mouth daily.     ??? spironolactone (ALDACTONE) 25 mg tablet Take 1 Tab by mouth daily. 90 Tab 3   ??? albuterol (PROVENTIL HFA, VENTOLIN HFA, PROAIR HFA) 90 mcg/actuation inhaler Take 1 Puff by inhalation every four (4) hours as needed for Wheezing. 1 Inhaler 0   ??? fluticasone (FLONASE) 50 mcg/actuation nasal spray 2 Sprays by Both Nostrils route daily. (Patient taking differently: 2 Sprays by Both Nostrils route daily. As needed) 1 Bottle 5     Allergies   Allergen Reactions   ??? Penicillamine Itching   ??? Penicillins Rash and Itching     Itching bumps on hands and arms  Itching bumps on hands and arms  Itching bumps on hands and arms  Has patient had a PCN reaction causing immediate rash, facial/tongue/throat swelling, SOB or lightheadedness with hypotension: Yes  Has patient had a PCN reaction causing severe rash involving mucus membranes or skin necrosis: No  Has patient had a PCN reaction that required hospitalization unknown  Has patient had a PCN reaction occurring within the last 10 years: No  If all of the above answers are "NO", then may proceed with Cephalosporin use.     Itching bumps on hands and arms   ??? Tramadol Other (comments)     Psychotic Hallucinations  Psychotic Hallucinations  hallucinations  Psychotic Hallucinations     Family History   Problem Relation  Age of Onset   ??? Diabetes Mother    ??? Heart Attack Father         MI   ??? Heart Disease Maternal Grandmother    ??? Heart Disease Paternal Grandmother    ??? Kidney Disease Other         1 sib deceased   ??? Cancer Other         1 sib throat cancer   ??? Diabetes Other    ??? Diabetes Daughter    ??? Other Daughter         leukemia   ??? Hypertension Other      Social History     Tobacco Use   ??? Smoking status: Never Smoker   ??? Smokeless tobacco: Never Used   Substance Use Topics   ??? Alcohol use: No     Patient Active Problem List   Diagnosis Code   ??? Fibroid D21.9   ??? HTN (hypertension) I10   ??? Gallstones K80.20   ??? Insomnia G47.00   ??? OSA (obstructive sleep apnea) G47.33   ??? Cataract H26.9   ??? AICD (automatic cardioverter/defibrillator) present Z95.810   ??? Chronic systolic heart failure (HCC) I50.22   ??? Automatic implantable cardioverter-defibrillator in situ Z95.810   ??? Mixed hyperlipidemia E78.2   ??? AR (allergic rhinitis) J30.9   ??? Non-rheumatic mitral regurgitation I34.0   ??? Age-related osteoporosis without current pathological fracture M81.0   ??? Multinodular goiter E04.2   ??? History of meningioma Z86.018   ??? 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 She does not want to pursue getting a hearing aid.     Activities of Daily Living   The home contains: no safety equipment.  Patient does total self care   She is able to perform ADLs/IADLs.    Fall Risk  Fall Risk Assessment, last 12 mths 08/05/2017   Able to walk? Yes   Fall in past 12 months? Yes   Fall with injury? Yes   Number of falls in past 12 months 1   Fall Risk Score 2   She suffered a fall in April. No fx. She fell down while going down three steps. No preceding sx are known. She had LOC and went to the ED. She was wearing a small high heel shoe at the time.     Abuse Screen  Patient is not abused     ROS:  Gen: No fever/chlls  HEENT: No sore throat, eye pain, ear pain, or congestion. No HA  CV: No CP today.   Resp: No cough. +SOB improved s/p LVAD placement  GI: No abdominal pain  GU: No hematuria/dysuria  Derm: No rash  Neuro: No new paresthesias/weakness  Musc: No new myalgias/jt pain  Psych: No depression sx      Cognitive Screening   Evaluation of Cognitive Function:  Has  your family/caregiver stated any concerns about your memory: no  Normal     Visit Vitals  Pulse (!) 50   Temp 97.8 ??F (36.6 ??C) (Oral)   Resp 14   Ht 5\' 10"  (1.778 m)   Wt 170 lb (77.1 kg)   SpO2 97%   BMI 24.39 kg/m??     General:  Alert, cooperative, no distress   Head:  Normocephalic, without obvious abnormality, atraumatic.   Eyes:  Conjunctivae clear   Ears:  Clear external auditory canals   Nose: Nares normal. Septum midline. Mucosa normal. No drainage or sinus tenderness.   Throat: Lips, mucosa, and tongue normal. Unremarkable oropharynx   Neck: Supple, symmetrical, trachea midline, no adenopathy.       Lungs:   Clear to auscultation bilaterally.       Heart:  Machine sound along anterior chest       Abdomen:   Soft, non-tender. Bowel sounds normal. No masses.           Extremities: Extremities normal no edema.   Pulses: +2 radial pulses b/l   Skin:  No rashes or lesions.   Lymph nodes: Cervical LN normal.   Neurologic:  Psych: Stable gait  Euthymic       3/3 short item recall   She incorrectly drew the clock for the clock drawing exercise    Patient Care Team   Patient Care Team:  Leo Rod, MD as PCP - General (Family Practice)  Kerrin Champagne, MD (Ophthalmology)  Tona Sensing, MD (Cardiology)    Assessment/Plan   Education and counseling provided:  Are appropriate based on today's review and evaluation    Diagnoses and all orders for this visit:    1. Encounter for screening mammogram for breast cancer  -     MAM 3D TOMO W MAMMO BI SCREENING INCL CAD; Future    2. Medicare annual wellness visit, subsequent        Health Maintenance Due   Topic Date Due   ??? DTaP/Tdap/Td series (1 - Tdap) 04/12/1971   ??? Shingrix Vaccine Age 32> (1 of 2) 04/11/2000   ??? MEDICARE YEARLY EXAM  07/18/2017       Health Maintenance:  - Ordering a mammogram  - I will recheck her memory at her f/u apt.  ???I recommend that she get all recommended vaccinations.  This was discussed with her today.        Advance Care Planning (ACP) Provider Conversation Snapshot    Date of ACP Conversation: 08/05/17  Persons included in Conversation:  patient  Length of ACP Conversation in minutes:  <16 minutes (Non-Billable)    Authorized Media planner (if patient is incapable of making informed decisions):   This person is:   Automotive engineer        For Patients with Decision Making Capacity:   Values/Goals: Exploration of values, goals, and preferences if recovery is not expected, even with continued medical treatment in the event of:  Imminent death  Severe, permanent brain injury    Conversation Outcomes / Follow-Up Plan:   Recommended completion of Advance Directive form after review of ACP materials and conversation with prospective healthcare agent .  She completed her advance directive today.  It is being sent so that it can be scanned into her EHR.  She wants to be an organ donor but does not want to  donate her body to medical science.  If "life support"  medical treatment will not help her to improve (even with such measures) after 3 days, she does not want any additional "life support" treatments, wanting to be taken off of life support altogether.  She states that Jesus was able to rise again 3 days so 3 days his plenty of time to know whether or not she should have all life support measures removed.  If there is a chance that she can get better with life support though, she wants to continue life support measures.    Dr. Marius Ditch  [facility], [place] [address]  Phone: [phone]  Fax: [phone]

## 2017-08-05 NOTE — ACP (Advance Care Planning) (Signed)
Advance Care Planning (ACP) Provider Conversation Snapshot    Date of ACP Conversation: 08/05/17  Persons included in Conversation:  patient  Length of ACP Conversation in minutes:  <16 minutes (Non-Billable)    Authorized Media planner (if patient is incapable of making informed decisions):   This person is:   Automotive engineer        For Patients with Decision Making Capacity:   Values/Goals: Exploration of values, goals, and preferences if recovery is not expected, even with continued medical treatment in the event of:  Imminent death  Severe, permanent brain injury    Conversation Outcomes / Follow-Up Plan:   Recommended completion of Advance Directive form after review of ACP materials and conversation with prospective healthcare agent .  She completed her advance directive today.  It is being sent so that it can be scanned into her EHR.  She wants to be an organ donor but does not want to donate her body to medical science.  If "life support" medical treatment will not help her to improve (even with such measures) after 3 days, she does not want any additional "life support" treatments, wanting to be taken off of life support altogether.  She states that Jesus was able to rise again 3 days so 3 days his plenty of time to know whether or not she should have all life support measures removed.  If there is a chance that she can get better with life support though, she wants to continue life support measures.    Dr. Marius Ditch  [facility], [place] [address]  Phone: [phone]  Fax: [phone]

## 2017-08-05 NOTE — ACP (Advance Care Planning) (Signed)
Patient was given a copy of the Advanced Medical Directive Form, and understands to bring it in once completed.

## 2017-08-05 NOTE — Progress Notes (Signed)
[facility]:  08/05/2017, MRN: [id number]      Cathy [name] is a 67 y.o. female and presents to clinic for Annual Wellness Visit (ROOM  2)    Subjective:   The patient is a 67 year old female with history of hypertension, chronic systolic heart failure??(followed by Dr.McCray) with EF of 10%, mitral regurgitation, cholelithiasis, multinodular goiter, obstructive sleep apnea??(not on rx), allergic rhinitis, osteoporosis, uterine fibroids per EHR, insomnia, cataracts, dysphagia from achalasia?, LVAD placement in 2019, AICD placement, left Baker's cyst, hyperlipidemia, and meningioma status post resection.    1. Dysphagia: She reported dysphagia at her last apt. I encouraged her to f/u with GI at her last apt. Since then, she had an EGD. She was placed on PPI. Her sx of dysphagia improved after starting PPI and having her EGD.    2. CHF: Present for yrs. S/p LVAD placement. She had a chest CTA earlier this yr. Findings are listed below. On coumadin. No hematochezia or melena.  No vaginal bleeding.  No hematuria. She has less fatigue than she experienced prior to having the LVAD placed. She occasionally has CP and SOB but sx improved s/p LVAD placement. No drainage along the LVAD sight. No redness.     05/05/17 Chest CTA: LVAD pump/inflow cannula and outflow cannula and drive line are in  regular position. No definite fluid collection is identified. No findings  to suggest abscess formation. The LVAD inflow and outflow cannula are free from thrombus.????Dynamic  imaging reveals the inflow cannula tip to be widely unobstructed  throughout the cardiac cycle. Left sided pacer/ICD with leads that appear to be in satisfactory  Positions. Lung windows reveal small bilateral pleural effusions with adjacent  atelectasis, left greater than right.        Patient Active Problem List    Diagnosis Date Noted   ??? Esophageal dysphagia 03/17/2017   ??? Age-related osteoporosis without current pathological fracture  03/04/2016   ??? Multinodular goiter 03/04/2016   ??? History of meningioma 03/04/2016   ??? Non-rheumatic mitral regurgitation 04/25/2014   ??? AR (allergic rhinitis) 09/29/2011   ??? Chronic systolic heart failure ([place])    ??? Automatic implantable cardioverter-defibrillator in situ    ??? Mixed hyperlipidemia    ??? AICD (automatic cardioverter/defibrillator) present 11/15/2010   ??? Cataract 12/01/2008   ??? Gallstones 11/13/2008   ??? Insomnia 11/13/2008   ??? OSA (obstructive sleep apnea) 11/13/2008   ??? Fibroid 06/23/2008   ??? HTN (hypertension) 06/23/2008       Current Outpatient Medications   Medication Sig Dispense Refill   ??? aspirin (ASPIRIN) 325 mg tablet Take 325 mg by mouth daily.     ??? oxyCODONE-acetaminophen (PERCOCET) 5-325 mg per tablet Take 1 Tab by mouth every four (4) hours as needed.     ??? trimethoprim-polymyxin b (POLYTRIM) ophthalmic solution Apply 4 Drops to affected area daily. Applied to skin area as directed     ??? warfarin (COUMADIN) 1 mg tablet 3mg  daily     ??? bisacodyl (DULCOLAX) 5 mg EC tablet Take 5 mg by mouth daily as needed.     ??? ondansetron (ZOFRAN ODT) 4 mg disintegrating tablet Take 4 mg by mouth every eight (8) hours as needed.     ??? polyethylene glycol (MIRALAX) 17 gram packet Take 17 g by mouth daily as needed.     ??? melatonin 3 mg tablet Take 6 mg by mouth nightly.     ??? atorvastatin (LIPITOR) 40 mg tablet Take 40 mg by  mouth daily.     ??? latanoprost (XALATAN) 0.005 % ophthalmic solution Administer 1 Drop to both eyes nightly.     ??? timolol (TIMOPTIC-XE) 0.5 % ophthalmic gel-forming Administer 1 Drop to both eyes daily.     ??? calcium carbonate (OS-CAL) 500 mg calcium (1,250 mg) tablet Take 1,250 mg by mouth daily.     ??? spironolactone (ALDACTONE) 25 mg tablet Take 1 Tab by mouth daily. 90 Tab 3   ??? albuterol (PROVENTIL HFA, VENTOLIN HFA, PROAIR HFA) 90 mcg/actuation inhaler Take 1 Puff by inhalation every four (4) hours as needed for Wheezing. 1 Inhaler 0    ??? fluticasone (FLONASE) 50 mcg/actuation nasal spray 2 Sprays by Both Nostrils route daily. (Patient taking differently: 2 Sprays by Both Nostrils route daily. As needed) 1 Bottle 5       Allergies   Allergen Reactions   ??? Penicillamine Itching   ??? Penicillins Rash and Itching     Itching bumps on hands and arms  Itching bumps on hands and arms  Itching bumps on hands and arms  Has patient had a PCN reaction causing immediate rash, facial/tongue/throat swelling, SOB or lightheadedness with hypotension: Yes  Has patient had a PCN reaction causing severe rash involving mucus membranes or skin necrosis: No  Has patient had a PCN reaction that required hospitalization unknown  Has patient had a PCN reaction occurring within the last 10 years: No  If all of the above answers are "NO", then may proceed with Cephalosporin use.    Itching bumps on hands and arms   ??? Tramadol Other (comments)     Psychotic Hallucinations  Psychotic Hallucinations  hallucinations  Psychotic Hallucinations       Past Medical History:   Diagnosis Date   ??? Automatic implantable cardiac defibrillator in situ     Post ddd icd Set up [facility]   ??? Baker cyst, left 12/2016   ??? Breast mass, left     benign   ??? CHF (congestive heart failure) ([place]) 06/23/2008    Non-ischemic CMP , Cath (2008) Normal coronary arteries   ??? Chronic systolic heart failure ([facility])     Stable,    ??? Degenerative arthritis of left knee    ??? Fibroid 06/23/2008   ??? History of brain tumor 2013   ??? HLD (hyperlipidemia)    ??? HTN (hypertension) 06/23/2008   ??? Hypotension, unspecified     Related to lisinopril   ??? 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 Left knee pain     With swelling   ??? Leg pain    ??? 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 OSA (obstructive sleep apnea) 9/10   ??? Venous reflux    ??? Wears glasses        Past Surgical History:   Procedure Laterality Date   ??? FULL ESOPHAGEAL MANOMETRY  02/16/2017         ??? HX BREAST BIOPSY      rt breast, benign   ??? HX CHOLECYSTECTOMY     ??? HX CRANIOTOMY  2013    meningioma    ??? HX HEENT      glasses   ??? HX HYSTERECTOMY     ??? HX IMPLANTABLE CARDIOVERTER DEFIBRILLATOR     ??? HX PACEMAKER  2013    dual chamber icd   ??? HX TONSIL AND ADENOIDECTOMY     ???  HX TOTAL ABDOMINAL HYSTERECTOMY  1996   ??? HX TUBAL LIGATION         Family History   Problem Relation Age of Onset   ??? Diabetes Mother    ??? Heart Attack Father         MI   ??? Heart Disease Maternal Grandmother    ??? Heart Disease Paternal Grandmother    ??? Kidney Disease Other         1 sib deceased   ??? Cancer Other         1 sib throat cancer   ??? Diabetes Other    ??? Diabetes Daughter    ??? Other Daughter         leukemia   ??? Hypertension Other        Social History     Tobacco Use   ??? Smoking status: Never Smoker   ??? Smokeless tobacco: Never Used   Substance Use Topics   ??? Alcohol use: No       ROS   Review of Systems   Constitutional: Negative for chills and fever.   HENT: Positive for hearing loss (chronic but does not want to pursue rx). Negative for ear pain and sore throat.    Eyes: Negative for blurred vision and pain.   Respiratory: Positive for shortness of breath (improved since getting her LVAD placed).    Cardiovascular: Negative for chest pain (none today).   Gastrointestinal: Negative for abdominal pain, blood in stool and melena.   Genitourinary: Negative for dysuria and hematuria.   Musculoskeletal: Positive for joint pain (off/on). Negative for myalgias.   Skin: Negative for rash.   Neurological: Negative for focal weakness and headaches.   Endo/Heme/Allergies: Does not bruise/bleed easily.   Psychiatric/Behavioral: Negative for substance abuse.     Objective     Vitals:    08/05/17 1152   Pulse: (!) 50   Resp: 14   Temp: 97.8 ??F (36.6 ??C)   TempSrc: Oral   SpO2: 97%   Weight: 170 lb (77.1 kg)   Height: 5\' 10"  (1.778 m)   PainSc:   0 - No pain       Physical Exam    Constitutional: She is oriented to person, place, and time and well-developed, well-nourished, and in no distress.   HENT:   Head: Normocephalic and atraumatic.   Right Ear: External ear normal.   Left Ear: External ear normal.   Nose: Nose normal.   Mouth/Throat: Oropharynx is clear and moist. No oropharyngeal exudate.   Eyes: Pupils are equal, round, and reactive to light. Conjunctivae and EOM are normal. Right eye exhibits no discharge. Left eye exhibits no discharge. No scleral icterus.   Neck: Neck supple.   Cardiovascular:   +Machine sound present along chest   Pulmonary/Chest: Effort normal and breath sounds normal. No respiratory distress. She has no wheezes. She has no rales.   Abdominal: Soft. Bowel sounds are normal. She exhibits no distension. There is no tenderness. There is no rebound and no guarding.   Musculoskeletal: She exhibits no edema or tenderness (BUE).   Lymphadenopathy:     She has no cervical adenopathy.   Neurological: She is alert and oriented to person, place, and time. She exhibits normal muscle tone. Gait normal.   Skin: Skin is warm and dry. No erythema.   Psychiatric: Affect normal.   Nursing note and vitals reviewed.      LABS   Data Review:   Lab Results  Component Value Date/Time    WBC 4.5 (L) 02/25/2017 11:30 AM    Hemoglobin, POC 11.2 (L) 01/22/2017 01:08 PM    HGB 10.5 (L) 02/25/2017 11:30 AM    Hematocrit, POC 33 (L) 01/22/2017 01:08 PM    HCT 32.6 (L) 02/25/2017 11:30 AM    PLATELET 248 02/25/2017 11:30 AM    MCV 91.3 02/25/2017 11:30 AM       Lab Results   Component Value Date/Time    Sodium 144 02/25/2017 11:30 AM    Potassium 5.0 02/25/2017 11:30 AM    Chloride 109 (H) 02/25/2017 11:30 AM    CO2 27 02/25/2017 11:30 AM    Anion gap 8 02/25/2017 11:30 AM    Glucose 86 02/25/2017 11:30 AM    BUN 21 (H) 02/25/2017 11:30 AM    Creatinine 0.83 02/25/2017 11:30 AM    BUN/Creatinine ratio 25 (H) 02/25/2017 11:30 AM    GFR est AA >60 02/25/2017 11:30 AM     GFR est non-AA >60 02/25/2017 11:30 AM    Calcium 8.2 (L) 02/25/2017 11:30 AM       Lab Results   Component Value Date/Time    Cholesterol, total 161 04/09/2015 08:11 AM    HDL Cholesterol 73 (H) 04/09/2015 08:11 AM    LDL, calculated 79.8 04/09/2015 08:11 AM    VLDL, calculated 8.2 04/09/2015 08:11 AM    Triglyceride 41 04/09/2015 08:11 AM    CHOL/HDL Ratio 2.2 04/09/2015 08:11 AM       Lab Results   Component Value Date/Time    Hemoglobin A1c 5.8 12/09/2012 10:50 AM       Assessment/Plan:   1.  Dysphagia: Stable.  ???Continue with Rx as prescribed.  ???Continue follow-up with GI.    2. CHF: S/p LVAD placement.  - C/w Cardiology f/u and rx.  ??? Continue with Rx as prescribed.      Health Maintenance Due   Topic Date Due   ??? DTaP/Tdap/Td series (1 - Tdap) 04/12/1971   ??? Shingrix Vaccine Age 62> (1 of 2) 04/11/2000   ??? MEDICARE YEARLY EXAM  07/18/2017     Lab review: labs are reviewed in the EHR    I have discussed the diagnosis with the patient and the intended plan as seen in the above orders.  The patient has received an after-visit summary and questions were answered concerning future plans.  I have discussed medication side effects and warnings with the patient as well. I have reviewed the plan of care with the patient, accepted their input and they are in agreement with the treatment goals. All questions were answered. The patient understands the plan of care. Handouts provided today with above information. Pt instructed if symptoms worsen to call the office or report to the ED for continued care.  Greater than 50% of the visit time was spent in counseling and/or coordination of care.      Voice recognition was used to generate this report, which may have resulted in some phonetic based errors in grammar and contents. Even though attempts were made to correct all the mistakes, some may have been missed, and remained in the body of the document.          Leo Rod, MD

## 2017-08-05 NOTE — Patient Instructions (Addendum)
Patient was given a copy of the Advanced Medical Directive Form, and understands to bring it in once completed.  Health Maintenance Due   Topic Date Due   ??? DTaP/Tdap/Td series (1 - Tdap) 04/12/1971   ??? Shingrix Vaccine Age 67> (1 of 2) 04/11/2000   ??? MEDICARE YEARLY EXAM  07/18/2017       Medicare Wellness Visit, Female     The best way to live healthy is to have a lifestyle where you eat a well-balanced diet, exercise regularly, limit alcohol use, and quit all forms of tobacco/nicotine, if applicable.   Regular preventive services are another way to keep healthy. Preventive services (vaccines, screening tests, monitoring & exams) can help personalize your care plan, which helps you manage your own care. Screening tests can find health problems at the earliest stages, when they are easiest to treat.   [facility] follows the current, evidence-based guidelines published by the [place] [employer] (USPSTF) when recommending preventive services for our patients. Because we follow these guidelines, sometimes recommendations change over time as research supports it. (For example, mammograms used to be recommended annually. Even though Medicare will still pay for an annual mammogram, the newer guidelines recommend a mammogram every two years for women of average risk.)  Of course, you and your doctor may decide to screen more often for some diseases, based on your risk and your health status.   Preventive services for you include:  - Medicare offers their members a free annual wellness visit, which is time for you and your primary care provider to discuss and plan for your preventive service needs. Take advantage of this benefit every year!  -All adults over the age of 74 should receive the recommended pneumonia vaccines. Current USPSTF guidelines recommend a series of two vaccines for the best pneumonia protection.   -All adults should have a flu vaccine yearly and a tetanus vaccine every  10 years. All adults age 71 and older should receive a shingles vaccine once in their lifetime.    -A bone mass density test is recommended when a woman turns 65 to screen for osteoporosis. This test is only recommended one time, as a screening. Some providers will use this same test as a disease monitoring tool if you already have osteoporosis.  -All adults age 66-70 who are overweight should have a diabetes screening test once every three years.   -Other screening tests and preventive services for persons with diabetes include: an eye exam to screen for diabetic retinopathy, a kidney function test, a foot exam, and stricter control over your cholesterol.   -Cardiovascular screening for adults with routine risk involves an electrocardiogram (ECG) at intervals determined by your doctor.   -Colorectal cancer screenings should be done for adults age 100-75 with no increased risk factors for colorectal cancer.  There are a number of acceptable methods of screening for this type of cancer. Each test has its own benefits and drawbacks. Discuss with your doctor what is most appropriate for you during your annual wellness visit. The different tests include: colonoscopy (considered the best screening method), a fecal occult blood test, a fecal DNA test, and sigmoidoscopy.  -Breast cancer screenings are recommended every other year for women of normal risk, age 53-74.  -Cervical cancer screenings for women over age 4 are only recommended with certain risk factors.   -All adults born between [place] should be screened once for Hepatitis C.     Here is a  list of your current Health Maintenance items (your personalized list of preventive services) with a due date:  Health Maintenance Due   Topic Date Due   ??? DTaP/Tdap/Td  (1 - Tdap) 04/12/1971   ??? Shingles Vaccine (1 of 2) 04/11/2000   ??? Annual Well Visit  07/18/2017         Medicare Part B Preventive Services Limitations Recommendation Scheduled   Bone Mass Measurement   (age 87 & older, biennial) Requires diagnosis related to osteoporosis or estrogen deficiency. Biennial benefit unless patient has history of long-term glucocorticoid tx or baseline is needed because initial test was by other method This should be done at age 68 and again if on osteoporosis medication at 2-3 year intervals.  Up to date   Cardiovascular Screening Blood Tests (every 5 years)  Total cholesterol, HDL, Triglycerides Order as a panel if possible We should check your cholesterol panel at least once every 5 years. Up to date   Colorectal Cancer Screening  -Fecal occult blood test (annual)  -Flexible sigmoidoscopy (5y)  -Screening colonoscopy (10y)  -Barium Enema  Due per your Gastroenterologist's recommendations.  Up to date   Counseling to Prevent Tobacco Use (up to 8 sessions per year)  - Counseling greater than 3 and up to 10 minutes  - Counseling greater than 10 minutes Patients must be asymptomatic of tobacco-related conditions to receive as preventive service Continue with smoking cessation    Diabetes Screening Tests (at least every 3 years, Medicare covers annually or at 45-month intervals for prediabetic patients)    Fasting blood sugar (FBS) or glucose tolerance test (GTT) Patient must be diagnosed with one of the following:  -Hypertension, Dyslipidemia, obesity, previous impaired FBS or GTT  ???Or any two of the following: overweight, FH of diabetes, age ?48, history of gestational diabetes, birth of baby weighing more than 9 pounds Should be done yearly Up to date   Diabetes Self-Management Training (DSMT) (no USPSTF recommendation) Requires referral by treating physician for patient with diabetes or renal disease. 10 hours of initial DSMT session of no less than 30 minutes each in a continuous 90-month period.  2 hours of follow-up DSMT in subsequent years. Not applicable Not applicable   Glaucoma Screening (no USPSTF recommendation) Diabetes mellitus, family  history, African American, age 67 or over, Hispanic American, age 45 or over Continue with annual eye exams. Up to date   Human Immunodeficiency Virus (HIV) Screening (annually for increased risk patients)  HIV-1 and HIV-2 by EIA, ELISA, rapid antibody test, or oral mucosa transudate Patient must be at increased risk for HIV infection per USPSTF guidelines or pregnant.  Tests covered annually for patients at increased risk.  Pregnant patients may receive up to 3 test during pregnancy. Not applicable Not applicable   Medical Nutrition Therapy (MNT) (for diabetes or renal disease not recommended schedule) Requires referral by treating physician for patient with diabetes or renal disease.  Can be provided in same year as diabetes self-management training (DSMT), and CMS recommends medical nutrition therapy take place after DSMT.  Up to 3 hours for initial year and 2 hours in subsequent years. Not applicable Not applicable   Shingles Vaccination A shingles vaccine is also recommended once in a lifetime after age 55 Vaccination recommended for shingles vaccination. Overdue   Seasonal Influenza Vaccination (annually)  Continue with yearly "flu" shot annually Flu season is over at present   Pneumococcal Vaccination (once after 65)  Please receive this vaccination at age 43. Up to  date   Hepatitis B Vaccinations (if medium/high risk) Medium/high risk factors:  End-stage renal disease,  Hemophiliacs who received Factor VIII or IX concentrates, Clients of institutions for the mentally retarded, Persons who live in the same house as a HepB virus carrier, Homosexual men, Illicit injectable drug abusers. Not applicable Not applicable   Screening Mammography (biennial age 60-74) Annually (age 93 or over) You need a mammogram yearly to screen for breast cancer. Up to date   Screening Pap Tests and Pelvic Examination (up to age 26 and after 75 if unknown history or abnormal study last 10 years) Every 24 months except  high risk You need no Pap smear at this time. Not warranted   Ultrasound Screening for Abdominal Aortic Aneurysm (AAA) (once) Patient must be referred through IPPE and not have had a screening for abdominal aortic aneurysm before under Medicare.  Limited to patients who meet one of the following criteria:  - Men who are 21-53 years old and have smoked more than 100 cigarettes in their lifetime.  -Anyone with a FH of AAA  -Anyone recommended for screening by USPSTF Not applicable Not applicable          Learning About Breast Cancer Screening  What is breast cancer screening?    Breast cancer occurs when cells that are not normal grow in one or both of your breasts. Screening tests can help find breast cancer early. Cancer is easier to treat when it's found early.  Having concerns about breast cancer is common. That's why it's important to talk with your doctor about when to start and how often to get screened for breast cancer.  How is breast cancer screening done?  Several screening tests can be used to check for breast cancer.  ?? Mammograms check for signs of cancer using X-rays. They can show tumors that are too small for you or your doctor to feel. During a mammogram, a machine squeezes your breasts to make them flatter and easier to X-ray. At least two pictures are taken of each breast. One is taken from the top and one from the side.  ?? 3-D mammograms are also called digital breast tomosynthesis. Your breast is positioned on a flat plate. A top plate is pressed against your breast to keep it in position. The X-ray arm then moves in an arc above the breast and takes many pictures. A computer uses these X-rays to create a three-dimensional image.  ?? Clinical breast exams are a doctor's exam. Your doctor carefully feels your breasts and under your arms to check for lumps or other changes.  After the screening, your doctor will tell you the results. You will also be told if you need any follow-up tests.   When should you get screened?  Talk with your doctor about when you should start being tested for breast cancer. How often you get tested and the kind of tests you get will depend on your age and your risk.  The guidelines that follow are for women who have an average risk for breast cancer. If you have a higher risk for breast cancer, such as having a family history of breast cancer in multiple relatives or at a young age, your doctor may recommend different screening for you.  ?? Ages 45 to 44: Some experts recommend that women have a clinical breast exam every 3 years, starting at age 8. Ask your doctor how often you should have this test. If you have a high risk for breast  cancer, talk with your doctor about when to start yearly mammograms and other screening tests.  ?? Ages 51 and older: Talk with your doctor about how often you should have mammograms and clinical breast exams.  What is your risk for breast cancer?  If you don't already know your risk of breast cancer, you can ask your doctor about it. You can also look it up at [employer].  If your doctor says that you have a high or very high risk, ask about ways to reduce your risk. These could include getting extra screening, taking medicine, or having surgery. If you have a strong family history of breast cancer, ask your doctor about genetic testing.  What steps can you take to stay healthy?  Some things that increase your risk of breast cancer, such as your age and being female, cannot be controlled. But you can do some things to stay as healthy as you can.  ?? Learn what your breasts normally look and feel like. If you notice any changes, tell your doctor.  ?? Drink alcohol wisely. Your risk goes up the more you drink. For the best health, women should have no more than 1 drink a day or 7 drinks a week.  ?? If you smoke, quit. When you quit smoking, you lower your chances of getting many types of cancer.   You can also do your best to eat well, be active, and stay at a healthy weight. Eating healthy foods and being active every day, as well as staying at a healthy weight, may help prevent cancer.  Where can you learn more?  Go to [employer].  Enter [phone] in the search box to learn more about "Learning About Breast Cancer Screening."  Current as of: May 20, 2016  Content Version: 11.9  ?? 2006-2018 [employer]. Care instructions adapted under license by [employer] (which disclaims liability or warranty for this information). If you have questions about a medical condition or this instruction, always ask your healthcare professional. [place] any warranty or liability for your use of this information.

## 2017-08-05 NOTE — Progress Notes (Signed)
[facility]:  08/05/2017, MRN: [id number]      Cathy [name] is a 67 y.o. female and presents to clinic for Annual Wellness Visit (ROOM  2)    Subjective:   The patient is a 67 year old female with history of hypertension, chronic systolic heart failure??(followed by Dr.McCray) with EF of 10%, mitral regurgitation, cholelithiasis, multinodular goiter, obstructive sleep apnea??(not on rx), allergic rhinitis, osteoporosis, uterine fibroids per EHR, insomnia, cataracts, dysphagia from achalasia?, LVAD placement in 2019, AICD placement, left Baker's cyst, hyperlipidemia, and meningioma status post resection.    1. Dysphagia: She reported dysphagia at her last apt. I encouraged her to f/u with GI at her last apt. Since then, she had an EGD. She was placed on PPI. Her sx of dysphagia improved after starting PPI and having her EGD.    2. CHF: Present for yrs. S/p LVAD placement. She had a chest CTA earlier this yr. Findings are listed below. On coumadin. No hematochezia or melena.  No vaginal bleeding.  No hematuria. She has less fatigue than she experienced prior to having the LVAD placed. She occasionally has CP and SOB but sx improved s/p LVAD placement. No drainage along the LVAD sight. No redness.     05/05/17 Chest CTA: LVAD pump/inflow cannula and outflow cannula and drive line are in  regular position. No definite fluid collection is identified. No findings  to suggest abscess formation. The LVAD inflow and outflow cannula are free from thrombus.????Dynamic  imaging reveals the inflow cannula tip to be widely unobstructed  throughout the cardiac cycle. Left sided pacer/ICD with leads that appear to be in satisfactory  Positions. Lung windows reveal small bilateral pleural effusions with adjacent  atelectasis, left greater than right.        Patient Active Problem List    Diagnosis Date Noted   ??? Esophageal dysphagia 03/17/2017   ??? Age-related osteoporosis without current pathological fracture  03/04/2016   ??? Multinodular goiter 03/04/2016   ??? History of meningioma 03/04/2016   ??? Non-rheumatic mitral regurgitation 04/25/2014   ??? AR (allergic rhinitis) 09/29/2011   ??? Chronic systolic heart failure ([place])    ??? Automatic implantable cardioverter-defibrillator in situ    ??? Mixed hyperlipidemia    ??? AICD (automatic cardioverter/defibrillator) present 11/15/2010   ??? Cataract 12/01/2008   ??? Gallstones 11/13/2008   ??? Insomnia 11/13/2008   ??? OSA (obstructive sleep apnea) 11/13/2008   ??? Fibroid 06/23/2008   ??? HTN (hypertension) 06/23/2008       Current Outpatient Medications   Medication Sig Dispense Refill   ??? aspirin (ASPIRIN) 325 mg tablet Take 325 mg by mouth daily.     ??? oxyCODONE-acetaminophen (PERCOCET) 5-325 mg per tablet Take 1 Tab by mouth every four (4) hours as needed.     ??? trimethoprim-polymyxin b (POLYTRIM) ophthalmic solution Apply 4 Drops to affected area daily. Applied to skin area as directed     ??? warfarin (COUMADIN) 1 mg tablet 3mg  daily     ??? bisacodyl (DULCOLAX) 5 mg EC tablet Take 5 mg by mouth daily as needed.     ??? ondansetron (ZOFRAN ODT) 4 mg disintegrating tablet Take 4 mg by mouth every eight (8) hours as needed.     ??? polyethylene glycol (MIRALAX) 17 gram packet Take 17 g by mouth daily as needed.     ??? melatonin 3 mg tablet Take 6 mg by mouth nightly.     ??? atorvastatin (LIPITOR) 40 mg tablet Take 40 mg by  mouth daily.     ??? latanoprost (XALATAN) 0.005 % ophthalmic solution Administer 1 Drop to both eyes nightly.     ??? timolol (TIMOPTIC-XE) 0.5 % ophthalmic gel-forming Administer 1 Drop to both eyes daily.     ??? calcium carbonate (OS-CAL) 500 mg calcium (1,250 mg) tablet Take 1,250 mg by mouth daily.     ??? spironolactone (ALDACTONE) 25 mg tablet Take 1 Tab by mouth daily. 90 Tab 3   ??? albuterol (PROVENTIL HFA, VENTOLIN HFA, PROAIR HFA) 90 mcg/actuation inhaler Take 1 Puff by inhalation every four (4) hours as needed for Wheezing. 1 Inhaler 0   ??? fluticasone (FLONASE) 50 mcg/actuation  nasal spray 2 Sprays by Both Nostrils route daily. (Patient taking differently: 2 Sprays by Both Nostrils route daily. As needed) 1 Bottle 5       Allergies   Allergen Reactions   ??? Penicillamine Itching   ??? Penicillins Rash and Itching     Itching bumps on hands and arms  Itching bumps on hands and arms  Itching bumps on hands and arms  Has patient had a PCN reaction causing immediate rash, facial/tongue/throat swelling, SOB or lightheadedness with hypotension: Yes  Has patient had a PCN reaction causing severe rash involving mucus membranes or skin necrosis: No  Has patient had a PCN reaction that required hospitalization unknown  Has patient had a PCN reaction occurring within the last 10 years: No  If all of the above answers are "NO", then may proceed with Cephalosporin use.    Itching bumps on hands and arms   ??? Tramadol Other (comments)     Psychotic Hallucinations  Psychotic Hallucinations  hallucinations  Psychotic Hallucinations       Past Medical History:   Diagnosis Date   ??? Automatic implantable cardiac defibrillator in situ     Post ddd icd Set up [facility]   ??? Baker cyst, left 12/2016   ??? Breast mass, left     benign   ??? CHF (congestive heart failure) ([place]) 06/23/2008    Non-ischemic CMP , Cath (2008) Normal coronary arteries   ??? Chronic systolic heart failure ([facility])     Stable,    ??? Degenerative arthritis of left knee    ??? Fibroid 06/23/2008   ??? History of brain tumor 2013   ??? HLD (hyperlipidemia)    ??? HTN (hypertension) 06/23/2008   ??? Hypotension, unspecified     Related to lisinopril   ??? 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 Left knee pain     With swelling   ??? Leg pain    ??? 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 OSA (obstructive sleep apnea) 9/10   ??? Venous reflux    ??? Wears glasses        Past Surgical History:   Procedure Laterality Date   ??? FULL ESOPHAGEAL MANOMETRY  02/16/2017        ??? HX BREAST BIOPSY      rt breast, benign   ??? HX  CHOLECYSTECTOMY     ??? HX CRANIOTOMY  2013    meningioma    ??? HX HEENT      glasses   ??? HX HYSTERECTOMY     ??? HX IMPLANTABLE CARDIOVERTER DEFIBRILLATOR     ??? HX PACEMAKER  2013    dual chamber icd   ??? HX TONSIL AND ADENOIDECTOMY     ???  HX TOTAL ABDOMINAL HYSTERECTOMY  1996   ??? HX TUBAL LIGATION         Family History   Problem Relation Age of Onset   ??? Diabetes Mother    ??? Heart Attack Father         MI   ??? Heart Disease Maternal Grandmother    ??? Heart Disease Paternal Grandmother    ??? Kidney Disease Other         1 sib deceased   ??? Cancer Other         1 sib throat cancer   ??? Diabetes Other    ??? Diabetes Daughter    ??? Other Daughter         leukemia   ??? Hypertension Other        Social History     Tobacco Use   ??? Smoking status: Never Smoker   ??? Smokeless tobacco: Never Used   Substance Use Topics   ??? Alcohol use: No       ROS   Review of Systems   Constitutional: Negative for chills and fever.   HENT: Positive for hearing loss (chronic but does not want to pursue rx). Negative for ear pain and sore throat.    Eyes: Negative for blurred vision and pain.   Respiratory: Positive for shortness of breath (improved since getting her LVAD placed).    Cardiovascular: Negative for chest pain (none today).   Gastrointestinal: Negative for abdominal pain, blood in stool and melena.   Genitourinary: Negative for dysuria and hematuria.   Musculoskeletal: Positive for joint pain (off/on). Negative for myalgias.   Skin: Negative for rash.   Neurological: Negative for focal weakness and headaches.   Endo/Heme/Allergies: Does not bruise/bleed easily.   Psychiatric/Behavioral: Negative for substance abuse.       Objective     Vitals:    08/05/17 1152   Pulse: (!) 50   Resp: 14   Temp: 97.8 ??F (36.6 ??C)   TempSrc: Oral   SpO2: 97%   Weight: 170 lb (77.1 kg)   Height: 5\' 10"  (1.778 m)   PainSc:   0 - No pain       Physical Exam   Constitutional: She is oriented to person, place, and time and well-developed, well-nourished, and in no  distress.   HENT:   Head: Normocephalic and atraumatic.   Right Ear: External ear normal.   Left Ear: External ear normal.   Nose: Nose normal.   Mouth/Throat: Oropharynx is clear and moist. No oropharyngeal exudate.   Eyes: Pupils are equal, round, and reactive to light. Conjunctivae and EOM are normal. Right eye exhibits no discharge. Left eye exhibits no discharge. No scleral icterus.   Neck: Neck supple.   Cardiovascular:   +Machine sound present along chest   Pulmonary/Chest: Effort normal and breath sounds normal. No respiratory distress. She has no wheezes. She has no rales.   Abdominal: Soft. Bowel sounds are normal. She exhibits no distension. There is no tenderness. There is no rebound and no guarding.   Musculoskeletal: She exhibits no edema or tenderness (BUE).   Lymphadenopathy:     She has no cervical adenopathy.   Neurological: She is alert and oriented to person, place, and time. She exhibits normal muscle tone. Gait normal.   Skin: Skin is warm and dry. No erythema.   Psychiatric: Affect normal.   Nursing note and vitals reviewed.      LABS   Data Review:  Lab Results   Component Value Date/Time    WBC 4.5 (L) 02/25/2017 11:30 AM    Hemoglobin, POC 11.2 (L) 01/22/2017 01:08 PM    HGB 10.5 (L) 02/25/2017 11:30 AM    Hematocrit, POC 33 (L) 01/22/2017 01:08 PM    HCT 32.6 (L) 02/25/2017 11:30 AM    PLATELET 248 02/25/2017 11:30 AM    MCV 91.3 02/25/2017 11:30 AM       Lab Results   Component Value Date/Time    Sodium 144 02/25/2017 11:30 AM    Potassium 5.0 02/25/2017 11:30 AM    Chloride 109 (H) 02/25/2017 11:30 AM    CO2 27 02/25/2017 11:30 AM    Anion gap 8 02/25/2017 11:30 AM    Glucose 86 02/25/2017 11:30 AM    BUN 21 (H) 02/25/2017 11:30 AM    Creatinine 0.83 02/25/2017 11:30 AM    BUN/Creatinine ratio 25 (H) 02/25/2017 11:30 AM    GFR est AA >60 02/25/2017 11:30 AM    GFR est non-AA >60 02/25/2017 11:30 AM    Calcium 8.2 (L) 02/25/2017 11:30 AM       Lab Results   Component Value Date/Time     Cholesterol, total 161 04/09/2015 08:11 AM    HDL Cholesterol 73 (H) 04/09/2015 08:11 AM    LDL, calculated 79.8 04/09/2015 08:11 AM    VLDL, calculated 8.2 04/09/2015 08:11 AM    Triglyceride 41 04/09/2015 08:11 AM    CHOL/HDL Ratio 2.2 04/09/2015 08:11 AM       Lab Results   Component Value Date/Time    Hemoglobin A1c 5.8 12/09/2012 10:50 AM       Assessment/Plan:   1.  Dysphagia: Stable.  ???Continue with Rx as prescribed.  ???Continue follow-up with GI.    2. CHF: S/p LVAD placement.  - C/w Cardiology f/u and rx.  ??? Continue with Rx as prescribed.      Health Maintenance Due   Topic Date Due   ??? DTaP/Tdap/Td series (1 - Tdap) 04/12/1971   ??? Shingrix Vaccine Age 81> (1 of 2) 04/11/2000   ??? MEDICARE YEARLY EXAM  07/18/2017     Lab review: labs are reviewed in the EHR    I have discussed the diagnosis with the patient and the intended plan as seen in the above orders.  The patient has received an after-visit summary and questions were answered concerning future plans.  I have discussed medication side effects and warnings with the patient as well. I have reviewed the plan of care with the patient, accepted their input and they are in agreement with the treatment goals. All questions were answered. The patient understands the plan of care. Handouts provided today with above information. Pt instructed if symptoms worsen to call the office or report to the ED for continued care.  Greater than 50% of the visit time was spent in counseling and/or coordination of care.      Voice recognition was used to generate this report, which may have resulted in some phonetic based errors in grammar and contents. Even though attempts were made to correct all the mistakes, some may have been missed, and remained in the body of the document.          Leo Rod, MD

## 2017-08-05 NOTE — Progress Notes (Signed)
Chief Complaint   Patient presents with   ??? Annual Wellness Visit     ROOM  2       1. Have you been to the ER, urgent care clinic since your last visit?  Hospitalized since your last visit? Yes. When: 07-15-17 Where: [facility] Reason: Heart replaced by heart assist device    2. Have you seen or consulted any other health care providers outside of the [facility] since your last visit?  Include any pap smears or colon screening. No    Patient was given a copy of the Advanced Directive and understands to bring it in once completed.  Health Maintenance Due   Topic Date Due   ??? DTaP/Tdap/Td series (1 - Tdap) 04/12/1971   ??? Shingrix Vaccine Age 52> (1 of 2) 04/11/2000   ??? MEDICARE YEARLY EXAM  07/18/2017     This is the Subsequent Medicare Annual Wellness Exam, performed 12 months or more after the Initial AWV or the last Subsequent AWV    I have reviewed the patient's medical history in detail and updated the computerized patient record.     History   The patient is a 67 year old female with history of hypertension, chronic systolic heart failure??(followed by Dr.McCray) with EF of 10%, mitral regurgitation, cholelithiasis, multinodular goiter, obstructive sleep apnea??(not on rx), allergic rhinitis, osteoporosis, uterine fibroids per EHR, insomnia, cataracts,??dysphagia from achalasia?,??LVAD placement in 2019, AICD placement,??left Baker's cyst,??hyperlipidemia, and meningioma status post resection.    Health Maintenance History  Immunizations reviewed:   Tdap over-due   Pneumovax: up to date   Flu: flu season is over at present  Zoster: over-due    Immunization History   Administered Date(s) Administered   ??? Influenza High Dose Vaccine PF 02/06/2016   ??? Influenza Vaccine 11/27/2008, 12/19/2009, 11/11/2011, 01/27/2013, 01/27/2013, 02/27/2014, 02/06/2016, 11/12/2016   ??? Influenza Vaccine (Quad) PF 02/27/2014   ??? Influenza Vaccine (Tri) Adjuvanted 11/12/2016   ??? Influenza Vaccine PF 11/11/2011   ???  Influenza Vaccine Split 11/27/2008, 12/19/2009   ??? Pneumococcal Conjugate (PCV-13) 04/25/2015   ??? Pneumococcal Polysaccharide (PPSV-23) 04/25/2015, 11/27/2016   ??? TB Skin Test (PPD) 03/31/2017       Colonoscopy: Up to date     Eye exam: Up to date    Mammo: Up to date. No breast pain/masses.    Dexascan: Up to date    Pelvic/Pap: No vaginal bleeding.        Past Medical History:   Diagnosis Date   ??? Automatic implantable cardiac defibrillator in situ     Post ddd icd Set up carelink   ??? Baker cyst, left 12/2016   ??? Breast mass, left     benign   ??? CHF (congestive heart failure) ([facility]) 06/23/2008    Non-ischemic CMP , Cath (2008) Normal coronary arteries   ??? Chronic systolic heart failure (HCC)     Stable,    ??? Degenerative arthritis of left knee    ??? Fibroid 06/23/2008   ??? History of brain tumor 2013   ??? HLD (hyperlipidemia)    ??? HTN (hypertension) 06/23/2008   ??? Hypotension, unspecified     Related to lisinopril   ??? 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 Left knee pain     With swelling   ??? Leg pain    ??? 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 OSA (obstructive  sleep apnea) 9/10   ??? Venous reflux    ??? Wears glasses       Past Surgical History:   Procedure Laterality Date   ??? FULL ESOPHAGEAL MANOMETRY  02/16/2017        ??? HX BREAST BIOPSY      rt breast, benign   ??? HX CHOLECYSTECTOMY     ??? HX CRANIOTOMY  2013    meningioma    ??? HX HEENT      glasses   ??? HX HYSTERECTOMY     ??? HX IMPLANTABLE CARDIOVERTER DEFIBRILLATOR     ??? HX PACEMAKER  2013    dual chamber icd   ??? HX TONSIL AND ADENOIDECTOMY     ??? HX TOTAL ABDOMINAL HYSTERECTOMY  1996   ??? HX TUBAL LIGATION       Current Outpatient Medications   Medication Sig Dispense Refill   ??? aspirin (ASPIRIN) 325 mg tablet Take 325 mg by mouth daily.     ??? oxyCODONE-acetaminophen (PERCOCET) 5-325 mg per tablet Take 1 Tab by mouth every four (4) hours as needed.     ??? trimethoprim-polymyxin b (POLYTRIM) ophthalmic  solution Apply 4 Drops to affected area daily. Applied to skin area as directed     ??? warfarin (COUMADIN) 1 mg tablet 3mg  daily     ??? bisacodyl (DULCOLAX) 5 mg EC tablet Take 5 mg by mouth daily as needed.     ??? ondansetron (ZOFRAN ODT) 4 mg disintegrating tablet Take 4 mg by mouth every eight (8) hours as needed.     ??? polyethylene glycol (MIRALAX) 17 gram packet Take 17 g by mouth daily as needed.     ??? melatonin 3 mg tablet Take 6 mg by mouth nightly.     ??? atorvastatin (LIPITOR) 40 mg tablet Take 40 mg by mouth daily.     ??? latanoprost (XALATAN) 0.005 % ophthalmic solution Administer 1 Drop to both eyes nightly.     ??? timolol (TIMOPTIC-XE) 0.5 % ophthalmic gel-forming Administer 1 Drop to both eyes daily.     ??? calcium carbonate (OS-CAL) 500 mg calcium (1,250 mg) tablet Take 1,250 mg by mouth daily.     ??? spironolactone (ALDACTONE) 25 mg tablet Take 1 Tab by mouth daily. 90 Tab 3   ??? albuterol (PROVENTIL HFA, VENTOLIN HFA, PROAIR HFA) 90 mcg/actuation inhaler Take 1 Puff by inhalation every four (4) hours as needed for Wheezing. 1 Inhaler 0   ??? fluticasone (FLONASE) 50 mcg/actuation nasal spray 2 Sprays by Both Nostrils route daily. (Patient taking differently: 2 Sprays by Both Nostrils route daily. As needed) 1 Bottle 5     Allergies   Allergen Reactions   ??? Penicillamine Itching   ??? Penicillins Rash and Itching     Itching bumps on hands and arms  Itching bumps on hands and arms  Itching bumps on hands and arms  Has patient had a PCN reaction causing immediate rash, facial/tongue/throat swelling, SOB or lightheadedness with hypotension: Yes  Has patient had a PCN reaction causing severe rash involving mucus membranes or skin necrosis: No  Has patient had a PCN reaction that required hospitalization unknown  Has patient had a PCN reaction occurring within the last 10 years: No  If all of the above answers are "NO", then may proceed with Cephalosporin use.    Itching bumps on hands and arms   ??? Tramadol Other  (comments)     Psychotic Hallucinations  Psychotic Hallucinations  hallucinations  Psychotic Hallucinations     Family History   Problem Relation Age of Onset   ??? Diabetes Mother    ??? Heart Attack Father         MI   ??? Heart Disease Maternal Grandmother    ??? Heart Disease Paternal Grandmother    ??? Kidney Disease Other         1 sib deceased   ??? Cancer Other         1 sib throat cancer   ??? Diabetes Other    ??? Diabetes Daughter    ??? Other Daughter         leukemia   ??? Hypertension Other      Social History     Tobacco Use   ??? Smoking status: Never Smoker   ??? Smokeless tobacco: Never Used   Substance Use Topics   ??? Alcohol use: No     Patient Active Problem List   Diagnosis Code   ??? Fibroid D21.9   ??? HTN (hypertension) I10   ??? Gallstones K80.20   ??? Insomnia G47.00   ??? OSA (obstructive sleep apnea) G47.33   ??? Cataract H26.9   ??? AICD (automatic cardioverter/defibrillator) present Z95.810   ??? Chronic systolic heart failure (HCC) I50.22   ??? Automatic implantable cardioverter-defibrillator in situ Z95.810   ??? Mixed hyperlipidemia E78.2   ??? AR (allergic rhinitis) J30.9   ??? Non-rheumatic mitral regurgitation I34.0   ??? Age-related osteoporosis without current pathological fracture M81.0   ??? Multinodular goiter E04.2   ??? History of meningioma Z86.018   ??? 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 She does not want to pursue getting a hearing aid.     Activities of Daily Living  The home contains: no safety equipment.  Patient does  total self care   She is able to perform ADLs/IADLs.    Fall Risk  Fall Risk Assessment, last 12 mths 08/05/2017   Able to walk? Yes   Fall in past 12 months? Yes   Fall with injury? Yes   Number of falls in past 12 months 1   Fall Risk Score 2   She suffered a fall in April. No fx. She fell down while going down three steps. No preceding sx are known. She had LOC and went to the ED. She was wearing a small high heel shoe at the time.     Abuse Screen  Patient is not abused     ROS:  Gen: No fever/chlls  HEENT: No sore throat, eye pain, ear pain, or congestion. No HA  CV: No CP today.   Resp: No cough. +SOB improved s/p LVAD placement  GI: No abdominal pain  GU: No hematuria/dysuria  Derm: No rash  Neuro: No new paresthesias/weakness  Musc: No new myalgias/jt pain  Psych: No depression sx  Cognitive Screening   Evaluation of Cognitive Function:  Has your family/caregiver stated any concerns about your memory: no  Normal     Visit Vitals  Pulse (!) 50   Temp 97.8 ??F (36.6 ??C) (Oral)   Resp 14   Ht 5\' 10"  (1.778 m)   Wt 170 lb (77.1 kg)   SpO2 97%   BMI 24.39 kg/m??     General:  Alert, cooperative, no distress   Head:  Normocephalic, without obvious abnormality, atraumatic.   Eyes:  Conjunctivae clear   Ears:  Clear external auditory canals   Nose: Nares normal. Septum midline. Mucosa normal. No drainage or sinus tenderness.   Throat: Lips, mucosa, and tongue normal. Unremarkable oropharynx   Neck: Supple, symmetrical, trachea midline, no adenopathy.       Lungs:   Clear to auscultation bilaterally.       Heart:  Machine sound along anterior chest       Abdomen:   Soft, non-tender. Bowel sounds normal. No masses.           Extremities: Extremities normal no edema.   Pulses: +2 radial pulses b/l   Skin:  No rashes or lesions.   Lymph nodes: Cervical LN normal.   Neurologic:  Psych: Stable gait  Euthymic       3/3 short item recall  She incorrectly drew the clock for the clock drawing exercise    Patient Care Team    Patient Care Team:  Leo Rod, MD as PCP - General (Family Practice)  Kerrin Champagne, MD (Ophthalmology)  Tona Sensing, MD (Cardiology)    Assessment/Plan   Education and counseling provided:  Are appropriate based on today's review and evaluation    Diagnoses and all orders for this visit:    1. Encounter for screening mammogram for breast cancer  -     MAM 3D TOMO W MAMMO BI SCREENING INCL CAD; Future    2. Medicare annual wellness visit, subsequent        Health Maintenance Due   Topic Date Due   ??? DTaP/Tdap/Td series (1 - Tdap) 04/12/1971   ??? Shingrix Vaccine Age 24> (1 of 2) 04/11/2000   ??? MEDICARE YEARLY EXAM  07/18/2017       Health Maintenance:  - Ordering a mammogram  - I will recheck her memory at her f/u apt.  ???I recommend that she get all recommended vaccinations.  This was discussed with her today.        Advance Care Planning (ACP) Provider Conversation Snapshot    Date of ACP Conversation: 08/05/17  Persons included in Conversation:  patient  Length of ACP Conversation in minutes:  <16 minutes (Non-Billable)    Authorized Media planner (if patient is incapable of making informed decisions):   This person is:   Automotive engineer        For Patients with Decision Making Capacity:   Values/Goals: Exploration of values, goals, and preferences if recovery is not expected, even with continued medical treatment in the event of:  Imminent death  Severe, permanent brain injury    Conversation Outcomes / Follow-Up Plan:   Recommended completion of Advance Directive form after review of ACP materials and conversation with prospective healthcare agent .  She completed her advance directive today.  It is being sent so that it can be scanned into her EHR.  She wants to be an organ donor but does not want to donate  her body to medical science.  If "life support" medical treatment will not help her to improve (even with such measures) after 3 days, she  does not want any additional "life support" treatments, wanting to be taken off of life support altogether.  She states that Jesus was able to rise again 3 days so 3 days his plenty of time to know whether or not she should have all life support measures removed.  If there is a chance that she can get better with life support though, she wants to continue life support measures.    Dr. Marius Ditch  [facility], [place] [address]  Phone: [phone]  Fax: [phone]

## 2017-08-05 NOTE — ACP (Advance Care Planning) (Signed)
Advance Care Planning (ACP) Provider Conversation Snapshot    Date of ACP Conversation: 08/05/17  Persons included in Conversation:  patient  Length of ACP Conversation in minutes:  <16 minutes (Non-Billable)    Authorized Media planner (if patient is incapable of making informed decisions):   This person is:   Automotive engineer        For Patients with Decision Making Capacity:   Values/Goals: Exploration of values, goals, and preferences if recovery is not expected, even with continued medical treatment in the event of:  Imminent death  Severe, permanent brain injury    Conversation Outcomes / Follow-Up Plan:   Recommended completion of Advance Directive form after review of ACP materials and conversation with prospective healthcare agent .  She completed her advance directive today.  It is being sent so that it can be scanned into her EHR.  She wants to be an organ donor but does not want to donate her body to medical science.  If "life support" medical treatment will not help her to improve (even with such measures) after 3 days, she does not want any additional "life support" treatments, wanting to be taken off of life support altogether.  She states that Jesus was able to rise again 3 days so 3 days his plenty of time to know whether or not she should have all life support measures removed.  If there is a chance that she can get better with life support though, she wants to continue life support measures.    Dr. Marius Ditch  [facility], [place] [address]  Phone: [phone]  Fax: [phone]

## 2017-08-05 NOTE — ACP (Advance Care Planning) (Signed)
Patient was given a copy of the Advanced Medical Directive Form, and understands to bring it in once completed.

## 2017-08-19 IMAGING — CT CT ANGIO CHEST
2 of 7 series · 17 of 46 positions shown · IV contrast (APPLIED)
Comparison: Chest radiograph June 30, 2015

CLINICAL DATA: Shortness of breath with chest pain

EXAM:
CT ANGIOGRAPHY CHEST WITH CONTRAST
TECHNIQUE: Multidetector CT imaging of the chest was performed using the
standard protocol during bolus administration of intravenous
contrast. Multiplanar CT image reconstructions and MIPs were
obtained to evaluate the vascular anatomy.
CONTRAST:  100 mL Isovue 370 nonionic

[Series 5: thins · axial · 0.59mm/px · z∈[-631,-436]mm · 14 of 221 slices shown]
[im 13/221  lung]
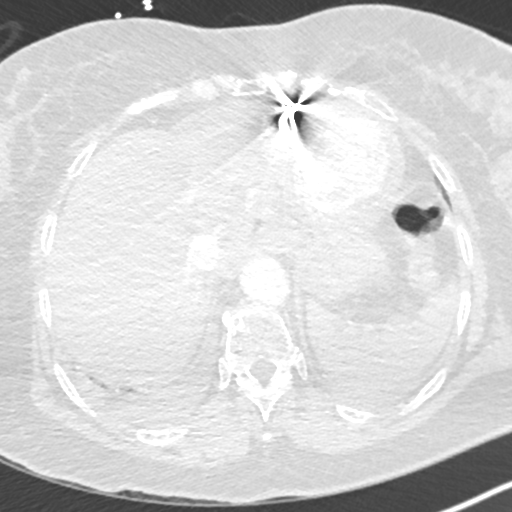
[im 25/221  soft-tissue]
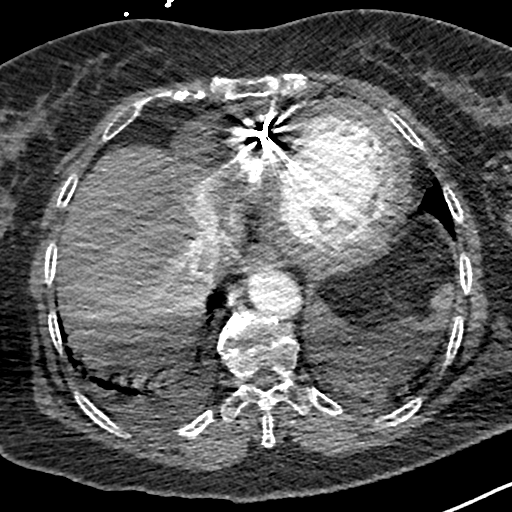
[im 49/221  lung]
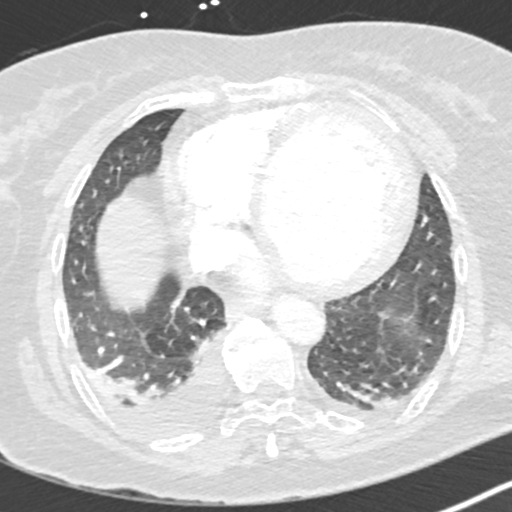
[im 62/221  soft-tissue]
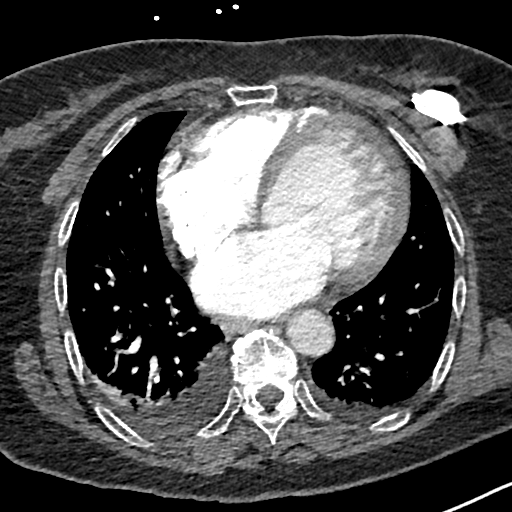
[im 74/221  lung]
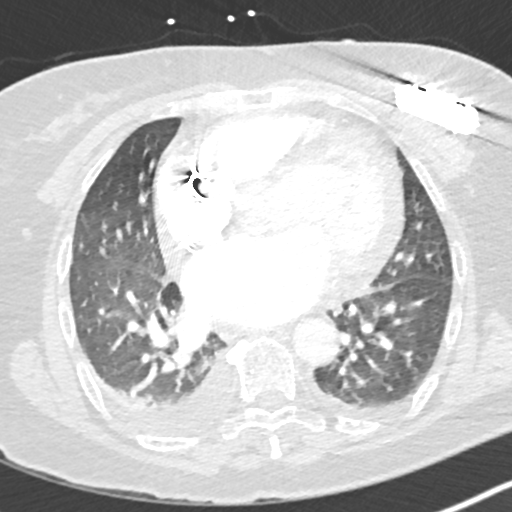
[im 86/221  soft-tissue]
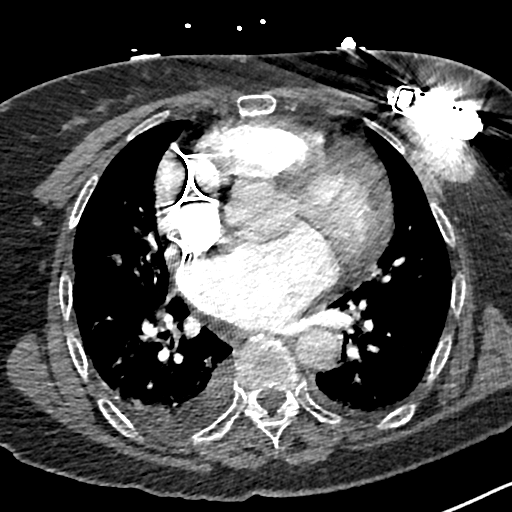
[im 98/221  lung]
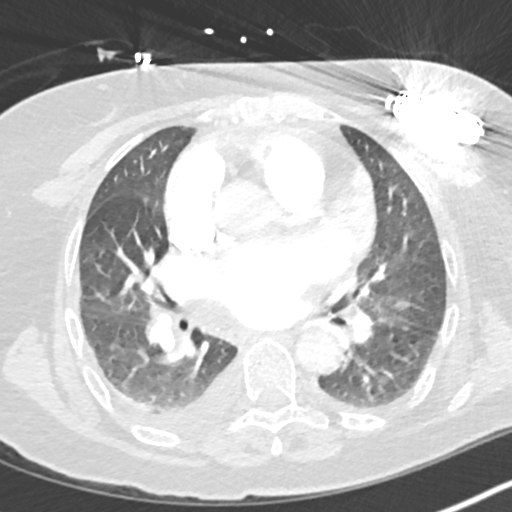
[im 123/221  soft-tissue]
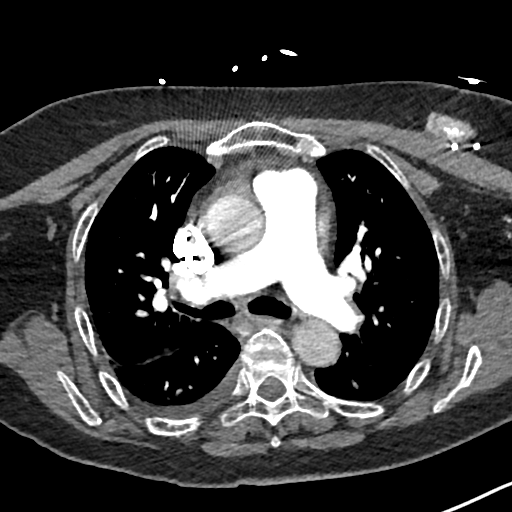
[im 135/221  lung]
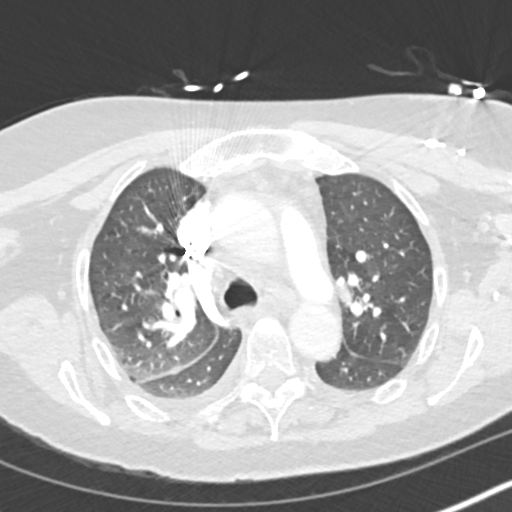
[im 147/221  soft-tissue]
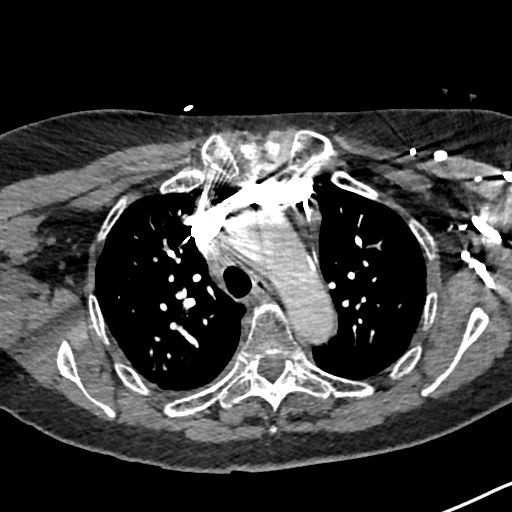
[im 159/221  lung]
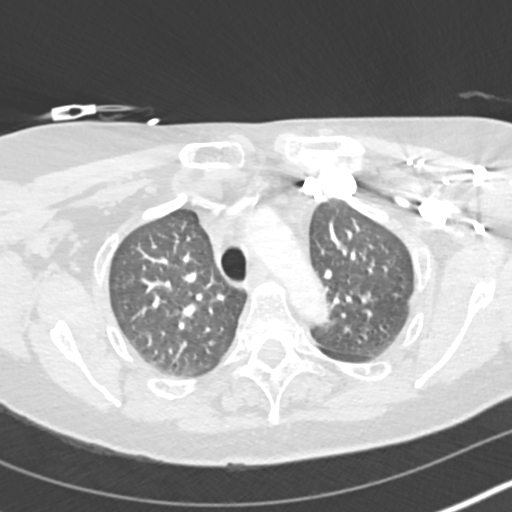
[im 172/221  soft-tissue]
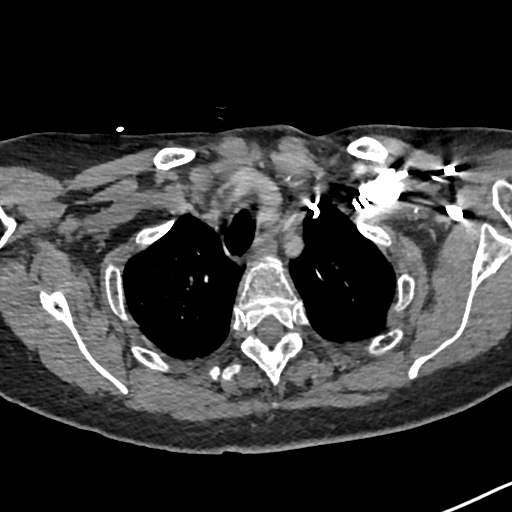
[im 196/221  lung]
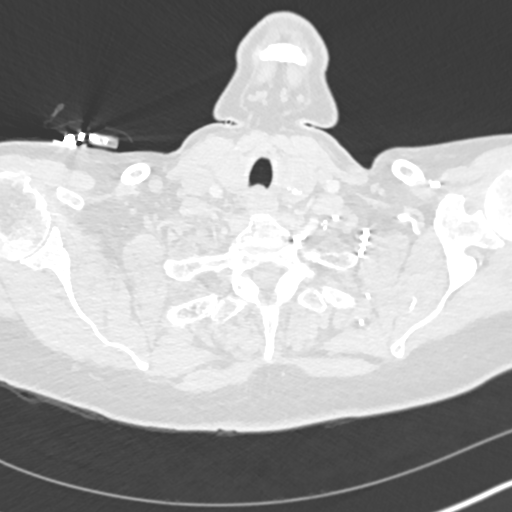
[im 208/221  soft-tissue]
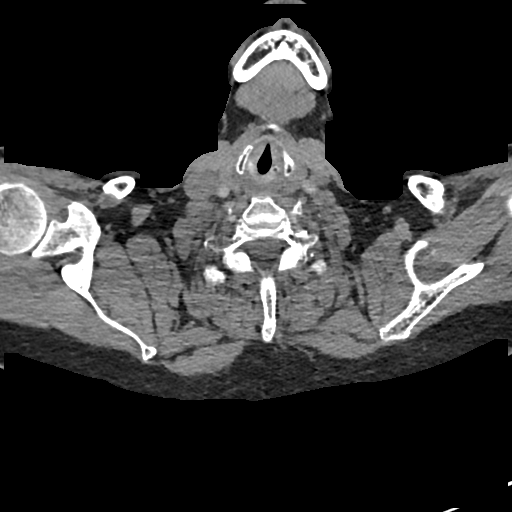

[Series 7: coronal mpr · coronal · 0.46mm/px · 3 of 121 slices shown]
[im 41/121  soft-tissue]
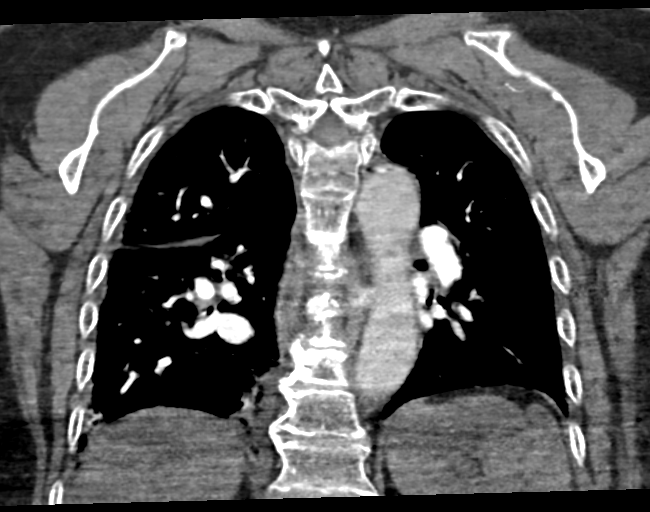
[im 54/121  soft-tissue]
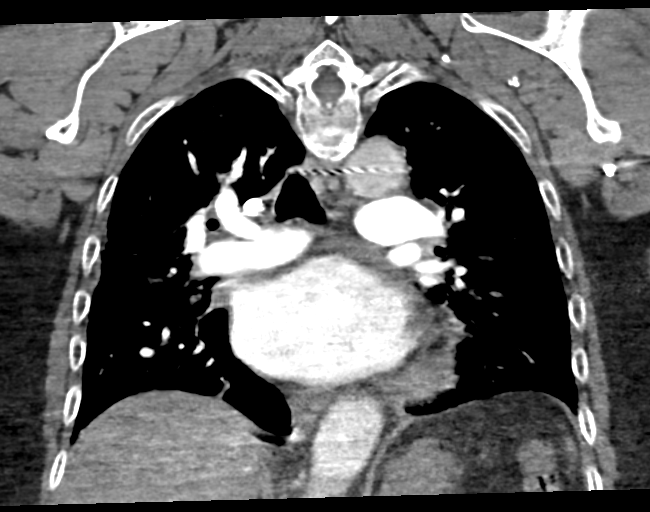
[im 67/121  soft-tissue]
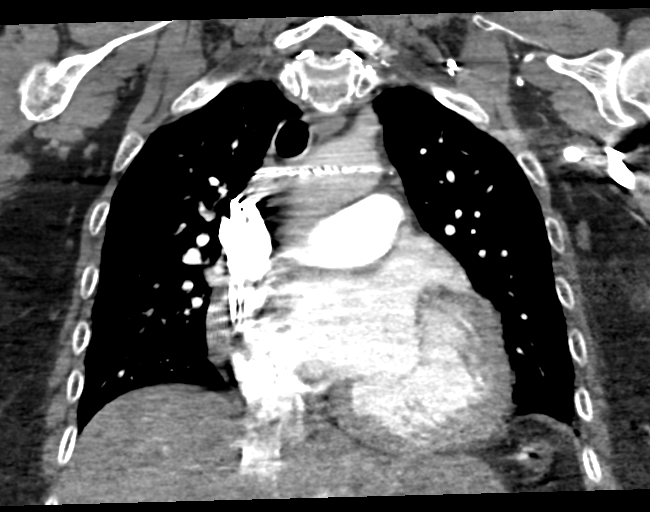

[17 of 46 positions shown; findings below may reference images not displayed]

FINDINGS: Mediastinum/Lymph Nodes: There is no demonstrable pulmonary embolus.
There is no thoracic aortic aneurysm or dissection. The visualized
great vessels appear normal. There are scattered foci of
atherosclerotic calcification in the aorta. The heart is mildly
enlarged with mild left ventricular hypertrophy. The pericardium is
not thickened. Pacemaker leads are attached to the right atrium and
right ventricle.

There is enlargement of the left lobe of the thyroid with multiple
coarse calcifications in the left lobe of the thyroid. There are
small mediastinal lymph nodes but no adenopathy by size criteria.

Lungs/Pleura: There is a fairly small right pleural effusion with
right base airspace consolidation. There is patchy atelectasis with
minimal effusion on the left. There is no parenchymal lung mass
appreciable; there is no pulmonary nodular lesion to account for the
nodular areas seen in the inferior right hilar region on the chest
radiograph.

Upper abdomen: Visualized upper abdominal structures are
unremarkable. Note that there is reflux of contrast into the
inferior vena cava.

Musculoskeletal: There is degenerative change in the thoracic spine.
There are no blastic or lytic bone lesions.

Review of the MIP images confirms the above findings.
IMPRESSION: No demonstrable pulmonary embolus.

Right pleural effusion with right base airspace consolidation. Small
left effusion with left base atelectatic change.

No parenchymal lung nodular lesion seen by CT.  No adenopathy.

Enlargement of the left lobe of the thyroid with coarse
calcifications in the left lobe of the thyroid.

Reflux of contrast into the inferior vena cava may be indicative of
increase in right heart pressure. Note that there is mild
cardiomegaly with mild left ventricular hypertrophy. Pacemaker leads
are attached to the right atrium and right ventricle.

## 2017-08-21 NOTE — Telephone Encounter (Signed)
Please have her contact her Cardiology/Heart Failure clinic ASAP for instructions. She has a left ventricular assist device (LVAD).    Dr. Marius Ditch  [facility], [place] [address]  Phone: [phone]  Fax: [phone]

## 2017-08-21 NOTE — Telephone Encounter (Signed)
Chief Complaint   Patient presents with   ??? Weight Gain     per Dr Cornelia Copa the patient needs to contact her Cardiology/Heart Failure clinic ASAP for instructions     08-21-17 Patient reached and 2 identifiers were used: Full Name, and Date of Birth verified.  The patient reached and informed, she will contact the Cardiology [facility] now for further instructions.  Patient understands all.

## 2017-08-21 NOTE — Telephone Encounter (Signed)
Patient stating she gained 5 lbs this week. Also, both ankles and her stomach are swollen.

## 2017-09-23 NOTE — Telephone Encounter (Signed)
Cardiologist wants her to see a thyroid Dr- she has a nodule on her thyroid-

## 2017-09-24 NOTE — Telephone Encounter (Signed)
Pt already was referred in 2018 to [facility]. Please have her schedule a f/u apt with Dr.Martin.    Dr. Marius Ditch  [facility], [place] [address]  Phone: [phone]  Fax: [phone]

## 2017-09-24 NOTE — Telephone Encounter (Signed)
LVM for patient to return our call. She will need to contact DR.Hassell Done at [phone] to schedule a f/u appointment.

## 2017-09-25 NOTE — Telephone Encounter (Signed)
Pt aware of message below and verbalized understanding. No further questions or concerns from pt at this time.

## 2017-10-05 ENCOUNTER — Inpatient Hospital Stay: Admit: 2017-10-05 | Payer: MEDICARE | Primary: Family Medicine

## 2017-10-05 ENCOUNTER — Ambulatory Visit: Admit: 2017-10-05 | Discharge: 2017-10-05 | Payer: MEDICARE | Attending: Family Medicine | Primary: Family Medicine

## 2017-10-05 ENCOUNTER — Ambulatory Visit: Attending: Family Medicine | Primary: Family Medicine

## 2017-10-05 DIAGNOSIS — I10 Essential (primary) hypertension: Secondary | ICD-10-CM

## 2017-10-05 NOTE — Progress Notes (Signed)
Chief Complaint   Patient presents with   ??? CHF     follow up  ROOM 2     The patient was asked to bring in her Blood Pressure Log she keeps at home for the [place] using a Doppler, and she will bring in her blood pressure readings to Dr Cornelia Copa on her next visit.     1. Have you been to the ER, urgent care clinic since your last visit?  Hospitalized since your last visit?No    2. Have you seen or consulted any other health care providers outside of the [facility] since your last visit?  Include any pap smears or colon screening. No    Health Maintenance Due   Topic Date Due   ??? DTaP/Tdap/Td series (1 - Tdap) 04/12/1971   ??? Shingrix Vaccine Age 66> (1 of 2) 04/11/2000

## 2017-10-05 NOTE — Progress Notes (Signed)
[facility]:  10/05/2017, MRN: [id number]      Cathy [name] is a 67 y.o. female and presents to clinic for CHF (follow up  ROOM 2)    Subjective:   The patient is a 67 year old female with history of hypertension, chronic systolic heart failure??(followed by Dr.McCray) with EF of 10%, mitral regurgitation, cholelithiasis, multinodular goiter, obstructive sleep apnea??(not on rx), allergic rhinitis, osteoporosis, uterine fibroids per EHR, insomnia, cataracts,??dysphagia from achalasia?,??LVAD placement in 2019,??AICD placement,??left Baker's cyst,??hyperlipidemia, and meningioma status post resection.    1. Mild Cognitive Impairment: At her last appointment, she had a difficult time completing a clock drawing exercise.  She had 3-3 with a short item recall.  Today, a [place] cognitive assessment was performed.  Her score is 28.     2. CHF/HTN: Present for years.  Followed by Cardiology. S/p LVAD placement earlier this year.  Taking: Warfarin, aspirin, and Aldactone.  No adverse side effects of taking this medicine regimen. No bleeding. She occasionally has SOB and CP but she has told her Cardiology team about this. She has persistent pain along her "drive line." Pain is not worsening.     3. HLD: Present for over 3 months.  Taking Lipitor.  No adverse side effects of taking this medicine. Her weight is 172lbs.     4.  Multinodular Goiter: She has a history of multinodular goiter for over 6 months.  She is not on any thyroid medications.  Her last thyroid ultrasound was in January of this year.She is scheduled to see ENT next month.     03/25/17 Thyroid Ultrasound: There are 2 left-sided thyroid nodules.  The solid nodule is similar or minimally increased in size from 1 year prior. Recommend continued sonographic follow-up as this corresponds to the nodule which was sampled with FNA which demonstrated no malignancy.  The additional left-sided nodule is minimally increased in size, but the  increase in size appears predominantly within the cystic component. Attention on follow-up is also advised for this nodule in one year.      Patient Active Problem List    Diagnosis Date Noted   ??? Esophageal dysphagia 03/17/2017   ??? Age-related osteoporosis without current pathological fracture 03/04/2016   ??? Multinodular goiter 03/04/2016   ??? History of meningioma 03/04/2016   ??? Non-rheumatic mitral regurgitation 04/25/2014   ??? AR (allergic rhinitis) 09/29/2011   ??? Chronic systolic heart failure ([place])    ??? Automatic implantable cardioverter-defibrillator in situ    ??? Mixed hyperlipidemia    ??? AICD (automatic cardioverter/defibrillator) present 11/15/2010   ??? Cataract 12/01/2008   ??? Gallstones 11/13/2008   ??? Insomnia 11/13/2008   ??? OSA (obstructive sleep apnea) 11/13/2008   ??? Fibroid 06/23/2008   ??? HTN (hypertension) 06/23/2008       Current Outpatient Medications   Medication Sig Dispense Refill   ??? bumetanide (BUMEX) 1 mg tablet Take 1 mg by mouth daily.     ??? ergocalciferol (ERGOCALCIFEROL) 50,000 unit capsule Take 50,000 Units by mouth every seven (7) days.     ??? aspirin (ASPIRIN) 325 mg tablet Take 325 mg by mouth daily.     ??? oxyCODONE-acetaminophen (PERCOCET) 5-325 mg per tablet Take 1 Tab by mouth every four (4) hours as needed.     ??? trimethoprim-polymyxin b (POLYTRIM) ophthalmic solution Apply 4 Drops to affected area daily. Applied to skin area as directed     ??? warfarin (COUMADIN) 1 mg tablet 3mg  daily     ??? bisacodyl (  DULCOLAX) 5 mg EC tablet Take 5 mg by mouth daily as needed.     ??? ondansetron (ZOFRAN ODT) 4 mg disintegrating tablet Take 4 mg by mouth every eight (8) hours as needed.     ??? polyethylene glycol (MIRALAX) 17 gram packet Take 17 g by mouth daily as needed.     ??? melatonin 3 mg tablet Take 6 mg by mouth nightly.     ??? atorvastatin (LIPITOR) 40 mg tablet Take 40 mg by mouth daily.     ??? latanoprost (XALATAN) 0.005 % ophthalmic solution Administer 1 Drop to both eyes nightly.      ??? timolol (TIMOPTIC-XE) 0.5 % ophthalmic gel-forming Administer 1 Drop to both eyes daily.     ??? calcium carbonate (OS-CAL) 500 mg calcium (1,250 mg) tablet Take 1,250 mg by mouth daily.     ??? spironolactone (ALDACTONE) 25 mg tablet Take 1 Tab by mouth daily. 90 Tab 3   ??? albuterol (PROVENTIL HFA, VENTOLIN HFA, PROAIR HFA) 90 mcg/actuation inhaler Take 1 Puff by inhalation every four (4) hours as needed for Wheezing. 1 Inhaler 0   ??? fluticasone (FLONASE) 50 mcg/actuation nasal spray 2 Sprays by Both Nostrils route daily. (Patient taking differently: 2 Sprays by Both Nostrils route daily. As needed) 1 Bottle 5       Allergies   Allergen Reactions   ??? Penicillamine Itching   ??? Penicillins Rash and Itching     Itching bumps on hands and arms  Itching bumps on hands and arms  Itching bumps on hands and arms  Has patient had a PCN reaction causing immediate rash, facial/tongue/throat swelling, SOB or lightheadedness with hypotension: Yes  Has patient had a PCN reaction causing severe rash involving mucus membranes or skin necrosis: No  Has patient had a PCN reaction that required hospitalization unknown  Has patient had a PCN reaction occurring within the last 10 years: No  If all of the above answers are "NO", then may proceed with Cephalosporin use.    Itching bumps on hands and arms   ??? Tramadol Other (comments)     Psychotic Hallucinations  Psychotic Hallucinations  hallucinations  Psychotic Hallucinations       Past Medical History:   Diagnosis Date   ??? Automatic implantable cardiac defibrillator in situ     Post ddd icd Set up [facility]   ??? Baker cyst, left 12/2016   ??? Breast mass, left     benign   ??? CHF (congestive heart failure) ([place]) 06/23/2008    Non-ischemic CMP , Cath (2008) Normal coronary arteries   ??? Chronic systolic heart failure ([facility])     Stable,    ??? Degenerative arthritis of left knee    ??? Fibroid 06/23/2008   ??? History of brain tumor 2013   ??? HLD (hyperlipidemia)    ??? HTN (hypertension) 06/23/2008    ??? Hypotension, unspecified     Related to lisinopril   ??? 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 Left knee pain     With swelling   ??? Leg pain    ??? 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 OSA (obstructive sleep apnea) 9/10   ??? Venous reflux    ??? Wears glasses        Past Surgical History:   Procedure Laterality Date   ??? FULL ESOPHAGEAL MANOMETRY  02/16/2017        ???  HX BREAST BIOPSY      rt breast, benign   ??? HX CHOLECYSTECTOMY     ??? HX CRANIOTOMY  2013    meningioma    ??? HX HEENT      glasses   ??? HX HYSTERECTOMY     ??? HX IMPLANTABLE CARDIOVERTER DEFIBRILLATOR     ??? HX PACEMAKER  2013    dual chamber icd   ??? HX TONSIL AND ADENOIDECTOMY     ??? HX TOTAL ABDOMINAL HYSTERECTOMY  1996   ??? HX TUBAL LIGATION         Family History   Problem Relation Age of Onset   ??? Diabetes Mother    ??? Heart Attack Father         MI   ??? Heart Disease Maternal Grandmother    ??? Heart Disease Paternal Grandmother    ??? Kidney Disease Other         1 sib deceased   ??? Cancer Other         1 sib throat cancer   ??? Diabetes Other    ??? Diabetes Daughter    ??? Other Daughter         leukemia   ??? Hypertension Other        Social History     Tobacco Use   ??? Smoking status: Never Smoker   ??? Smokeless tobacco: Never Used   Substance Use Topics   ??? Alcohol use: No       ROS   Review of Systems   Constitutional: Negative for chills and fever.   HENT: Negative for ear pain and sore throat.    Eyes: Negative for blurred vision and pain.   Respiratory: Positive for shortness of breath (off/on).    Cardiovascular: Positive for chest pain (off/on).        She has pain along the driveline.   Gastrointestinal: Negative for abdominal pain, blood in stool and melena.   Genitourinary: Negative for dysuria and hematuria.   Musculoskeletal: Negative for joint pain and myalgias.   Skin: Negative for rash.   Neurological: Negative for tingling, focal weakness and headaches.    Endo/Heme/Allergies: Does not bruise/bleed easily.   Psychiatric/Behavioral: Negative for substance abuse.       Objective     Vitals:    10/05/17 1013   Resp: 18   Temp: 97.8 ??F (36.6 ??C)   TempSrc: Oral   SpO2: 98%   Weight: 172 lb 3.2 oz (78.1 kg)   Height: 5\' 10"  (1.778 m)   PainSc:   0 - No pain       Physical Exam   Constitutional: She is oriented to person, place, and time and well-developed, well-nourished, and in no distress.   HENT:   Head: Normocephalic and atraumatic.   Right Ear: External ear normal.   Left Ear: External ear normal.   Nose: Nose normal.   Mouth/Throat: No oropharyngeal exudate.   Eyes: Conjunctivae and EOM are normal. Right eye exhibits no discharge. Left eye exhibits no discharge. No scleral icterus.   Neck: Neck supple.   Cardiovascular: Intact distal pulses.   Machine sound is present   Pulmonary/Chest: Effort normal and breath sounds normal. No respiratory distress. She has no wheezes. She has no rales.   Abdominal: Soft. Bowel sounds are normal. She exhibits no distension.   There is no erythema along her driveline bandage.   Musculoskeletal: She exhibits no edema or tenderness (BUE).   Lymphadenopathy:  She has no cervical adenopathy.   Neurological: She is alert and oriented to person, place, and time. She exhibits normal muscle tone. Gait normal.   Skin: Skin is warm and dry. No erythema.   Psychiatric: Affect normal.   Nursing note and vitals reviewed.      LABS   Data Review:   Lab Results   Component Value Date/Time    WBC 4.5 (L) 02/25/2017 11:30 AM    Hemoglobin, POC 11.2 (L) 01/22/2017 01:08 PM    HGB 10.5 (L) 02/25/2017 11:30 AM    Hematocrit, POC 33 (L) 01/22/2017 01:08 PM    HCT 32.6 (L) 02/25/2017 11:30 AM    PLATELET 248 02/25/2017 11:30 AM    MCV 91.3 02/25/2017 11:30 AM       Lab Results   Component Value Date/Time    Sodium 144 02/25/2017 11:30 AM    Potassium 5.0 02/25/2017 11:30 AM    Chloride 109 (H) 02/25/2017 11:30 AM    CO2 27 02/25/2017 11:30 AM     Anion gap 8 02/25/2017 11:30 AM    Glucose 86 02/25/2017 11:30 AM    BUN 21 (H) 02/25/2017 11:30 AM    Creatinine 0.83 02/25/2017 11:30 AM    BUN/Creatinine ratio 25 (H) 02/25/2017 11:30 AM    GFR est AA >60 02/25/2017 11:30 AM    GFR est non-AA >60 02/25/2017 11:30 AM    Calcium 8.2 (L) 02/25/2017 11:30 AM       Lab Results   Component Value Date/Time    Cholesterol, total 161 04/09/2015 08:11 AM    HDL Cholesterol 73 (H) 04/09/2015 08:11 AM    LDL, calculated 79.8 04/09/2015 08:11 AM    VLDL, calculated 8.2 04/09/2015 08:11 AM    Triglyceride 41 04/09/2015 08:11 AM    CHOL/HDL Ratio 2.2 04/09/2015 08:11 AM       Lab Results   Component Value Date/Time    Hemoglobin A1c 5.8 12/09/2012 10:50 AM       Assessment/Plan:   1. HTN/CHF: +LVAD but having persistent pain along her driveline  ???Checking a CRP, CMP, TFTs, lipid panel, and a urine protein screen.  ???Continue with Rx as prescribed.  ???I encouraged her to follow-up with Cardiology    ORDERS:  - METABOLIC PANEL, COMPREHENSIVE; Future  - TSH AND FREE T4; Future  - LIPID PANEL; Future  - MICROALBUMIN, UR, RAND W/ MICROALB/CREAT RATIO; Future  - C REACTIVE PROTEIN, QT; Future    2. Multinodular goiter:   ???I encouraged her to follow-up with ENT.  ???Checking TFTs.    ORDERS:  - TSH AND FREE T4; Future    3. Mixed hyperlipidemia: Stable.   ???Continue with Rx as prescribed.  ???Checking a CMP and lipid panel.    ORDERS:  - METABOLIC PANEL, COMPREHENSIVE; Future  - LIPID PANEL; Future    4. Mild Cognitive Impairment: MOCA is negative for dementia.  ???Observation.  We will repeat a screening for dementia in 1 year.      Health Maintenance Due   Topic Date Due   ??? DTaP/Tdap/Td series (1 - Tdap) 04/12/1971   ??? Shingrix Vaccine Age 61> (1 of 2) 04/11/2000     Lab review: labs are reviewed in the EHR    I have discussed the diagnosis with the patient and the intended plan as seen in the above orders.  The patient has received an after-visit summary  and questions were answered concerning future plans.  I have discussed medication side  effects and warnings with the patient as well. I have reviewed the plan of care with the patient, accepted their input and they are in agreement with the treatment goals. All questions were answered. The patient understands the plan of care. Handouts provided today with above information. Pt instructed if symptoms worsen to call the office or report to the ED for continued care.  Greater than 50% of the visit time was spent in counseling and/or coordination of care.      Voice recognition was used to generate this report, which may have resulted in some phonetic based errors in grammar and contents. Even though attempts were made to correct all the mistakes, some may have been missed, and remained in the body of the document.          Leo Rod, MD

## 2017-10-05 NOTE — Patient Instructions (Addendum)
Health Maintenance Due   Topic Date Due   ??? DTaP/Tdap/Td series (1 - Tdap) 04/12/1971   ??? Shingrix Vaccine Age 67> (1 of 2) 04/11/2000          High Cholesterol: Care Instructions  Your Care Instructions    Cholesterol is a type of fat in your blood. It is needed for many body functions, such as making new cells. Cholesterol is made by your body. It also comes from food you eat. High cholesterol means that you have too much of the fat in your blood. This raises your risk of a heart attack and stroke.  LDL and HDL are part of your total cholesterol. LDL is the "bad" cholesterol. High LDL can raise your risk for heart disease, heart attack, and stroke. HDL is the "good" cholesterol. It helps clear bad cholesterol from the body. High HDL is linked with a lower risk of heart disease, heart attack, and stroke.  Your cholesterol levels help your doctor find out your risk for having a heart attack or stroke. You and your doctor can talk about whether you need to lower your risk and what treatment is best for you.  A heart-healthy lifestyle along with medicines can help lower your cholesterol and your risk. The way you choose to lower your risk will depend on how high your risk is for heart attack and stroke. It will also depend on how you feel about taking medicines.  Follow-up care is a key part of your treatment and safety. Be sure to make and go to all appointments, and call your doctor if you are having problems. It's also a good idea to know your test results and keep a list of the medicines you take.  How can you care for yourself at home?  ?? Eat a variety of foods every day. Good choices include fruits, vegetables, whole grains (like oatmeal), dried beans and peas, nuts and seeds, soy products (like tofu), and fat-free or low-fat dairy products.  ?? Replace butter, margarine, and hydrogenated or partially hydrogenated oils with olive and canola oils. (Canola oil margarine without trans fat is fine.)   ?? Replace red meat with fish, poultry, and soy protein (like tofu).  ?? Limit processed and packaged foods like chips, crackers, and cookies.  ?? Bake, broil, or steam foods. Don't fry them.  ?? Be physically active. Get at least 30 minutes of exercise on most days of the week. Walking is a good choice. You also may want to do other activities, such as running, swimming, cycling, or playing tennis or team sports.  ?? Stay at a healthy weight or lose weight by making the changes in eating and physical activity listed above. Losing just a small amount of weight, even 5 to 10 pounds, can reduce your risk for having a heart attack or stroke.  ?? Do not smoke.  When should you call for help?  Watch closely for changes in your health, and be sure to contact your doctor if:  ?? ?? You need help making lifestyle changes.   ?? ?? You have questions about your medicine.   Where can you learn more?  Go to [employer].  Enter [phone] in the search box to learn more about "High Cholesterol: Care Instructions."  Current as of: September 14, 2016  Content Version: 12.1  ?? 2006-2019 [employer]. Care instructions adapted under license by [employer] (which disclaims liability or warranty for this information). If you have questions about a medical  condition or this instruction, always ask your healthcare professional. [employer] disclaims any warranty or liability for your use of this information.

## 2017-10-05 NOTE — Progress Notes (Signed)
 Chief Complaint   Patient presents with   . CHF     follow up  ROOM 2     The patient was asked to bring in her Blood Pressure Log she keeps at home for [facility] using a Doppler, and she will bring in her blood pressure readings to Dr Jonette on her next visit.     1. Have you been to the ER, urgent care clinic since your last visit?  Hospitalized since your last visit?No    2. Have you seen or consulted any other health care providers outside of the [facility] since your last visit?  Include any pap smears or colon screening. No    Health Maintenance Due   Topic Date Due   . DTaP/Tdap/Td series (1 - Tdap) 04/12/1971   . Shingrix Vaccine Age 57> (1 of 2) 04/11/2000

## 2017-10-05 NOTE — Progress Notes (Signed)
Please let her know that her electrolytes and kidney function labs are unremarkable.  Her TFTs are normal.  Her total cholesterol is 189.  Her triglycerides are 60.  Her HDL is 80.  Her LDL is 97.  She has a chronic anemia with a hemoglobin of 10.  The remainder of her CBC is unremarkable for significant abnormalities.  I will order a FIT test for her to get to rule out any intestinal bleeding.  Please have her get this done.    Dr. Marius Ditch  [facility], [place] [address]  Phone: [phone]  Fax: [phone]

## 2017-10-05 NOTE — Progress Notes (Signed)
[facility]:  10/05/2017, MRN: [id number]      Cathy [name] is a 67 y.o. female and presents to clinic for CHF (follow up  ROOM 2)    Subjective:   The patient is a 67 year old female with history of hypertension, chronic systolic heart failure??(followed by Dr.McCray) with EF of 10%, mitral regurgitation, cholelithiasis, multinodular goiter, obstructive sleep apnea??(not on rx), allergic rhinitis, osteoporosis, uterine fibroids per EHR, insomnia, cataracts,??dysphagia from achalasia?,??LVAD placement in 2019,??AICD placement,??left Baker's cyst,??hyperlipidemia, and meningioma status post resection.    1. Mild Cognitive Impairment: At her last appointment, she had a difficult time completing a clock drawing exercise.  She had 3-3 with a short item recall.  Today, a [place] cognitive assessment was performed.  Her score is 28.     2. CHF/HTN: Present for years.  Followed by Cardiology. S/p LVAD placement earlier this year.  Taking: Warfarin, aspirin, and Aldactone.  No adverse side effects of taking this medicine regimen. No bleeding. She occasionally has SOB and CP but she has told her Cardiology team about this. She has persistent pain along her "drive line." Pain is not worsening.     3. HLD: Present for over 3 months.  Taking Lipitor.  No adverse side effects of taking this medicine. Her weight is 172lbs.     4.  Multinodular Goiter: She has a history of multinodular goiter for over 6 months.  She is not on any thyroid medications.  Her last thyroid ultrasound was in January of this year.She is scheduled to see ENT next month.     03/25/17 Thyroid Ultrasound: There are 2 left-sided thyroid nodules.  The solid nodule is similar or minimally increased in size from 1 year prior. Recommend continued sonographic follow-up as this corresponds to the nodule which was sampled with FNA which demonstrated no malignancy.  The additional left-sided nodule is minimally increased in size, but the increase in  size appears predominantly within the cystic component. Attention on follow-up is also advised for this nodule in one year.      Patient Active Problem List    Diagnosis Date Noted   ??? Esophageal dysphagia 03/17/2017   ??? Age-related osteoporosis without current pathological fracture 03/04/2016   ??? Multinodular goiter 03/04/2016   ??? History of meningioma 03/04/2016   ??? Non-rheumatic mitral regurgitation 04/25/2014   ??? AR (allergic rhinitis) 09/29/2011   ??? Chronic systolic heart failure ([place])    ??? Automatic implantable cardioverter-defibrillator in situ    ??? Mixed hyperlipidemia    ??? AICD (automatic cardioverter/defibrillator) present 11/15/2010   ??? Cataract 12/01/2008   ??? Gallstones 11/13/2008   ??? Insomnia 11/13/2008   ??? OSA (obstructive sleep apnea) 11/13/2008   ??? Fibroid 06/23/2008   ??? HTN (hypertension) 06/23/2008       Current Outpatient Medications   Medication Sig Dispense Refill   ??? bumetanide (BUMEX) 1 mg tablet Take 1 mg by mouth daily.     ??? ergocalciferol (ERGOCALCIFEROL) 50,000 unit capsule Take 50,000 Units by mouth every seven (7) days.     ??? aspirin (ASPIRIN) 325 mg tablet Take 325 mg by mouth daily.     ??? oxyCODONE-acetaminophen (PERCOCET) 5-325 mg per tablet Take 1 Tab by mouth every four (4) hours as needed.     ??? trimethoprim-polymyxin b (POLYTRIM) ophthalmic solution Apply 4 Drops to affected area daily. Applied to skin area as directed     ??? warfarin (COUMADIN) 1 mg tablet 3mg  daily     ??? bisacodyl (  DULCOLAX) 5 mg EC tablet Take 5 mg by mouth daily as needed.     ??? ondansetron (ZOFRAN ODT) 4 mg disintegrating tablet Take 4 mg by mouth every eight (8) hours as needed.     ??? polyethylene glycol (MIRALAX) 17 gram packet Take 17 g by mouth daily as needed.     ??? melatonin 3 mg tablet Take 6 mg by mouth nightly.     ??? atorvastatin (LIPITOR) 40 mg tablet Take 40 mg by mouth daily.     ??? latanoprost (XALATAN) 0.005 % ophthalmic solution Administer 1 Drop to both eyes nightly.     ??? timolol (TIMOPTIC-XE)  0.5 % ophthalmic gel-forming Administer 1 Drop to both eyes daily.     ??? calcium carbonate (OS-CAL) 500 mg calcium (1,250 mg) tablet Take 1,250 mg by mouth daily.     ??? spironolactone (ALDACTONE) 25 mg tablet Take 1 Tab by mouth daily. 90 Tab 3   ??? albuterol (PROVENTIL HFA, VENTOLIN HFA, PROAIR HFA) 90 mcg/actuation inhaler Take 1 Puff by inhalation every four (4) hours as needed for Wheezing. 1 Inhaler 0   ??? fluticasone (FLONASE) 50 mcg/actuation nasal spray 2 Sprays by Both Nostrils route daily. (Patient taking differently: 2 Sprays by Both Nostrils route daily. As needed) 1 Bottle 5       Allergies   Allergen Reactions   ??? Penicillamine Itching   ??? Penicillins Rash and Itching     Itching bumps on hands and arms  Itching bumps on hands and arms  Itching bumps on hands and arms  Has patient had a PCN reaction causing immediate rash, facial/tongue/throat swelling, SOB or lightheadedness with hypotension: Yes  Has patient had a PCN reaction causing severe rash involving mucus membranes or skin necrosis: No  Has patient had a PCN reaction that required hospitalization unknown  Has patient had a PCN reaction occurring within the last 10 years: No  If all of the above answers are "NO", then may proceed with Cephalosporin use.    Itching bumps on hands and arms   ??? Tramadol Other (comments)     Psychotic Hallucinations  Psychotic Hallucinations  hallucinations  Psychotic Hallucinations       Past Medical History:   Diagnosis Date   ??? Automatic implantable cardiac defibrillator in situ     Post ddd icd Set up [facility]   ??? Baker cyst, left 12/2016   ??? Breast mass, left     benign   ??? CHF (congestive heart failure) ([place]) 06/23/2008    Non-ischemic CMP , Cath (2008) Normal coronary arteries   ??? Chronic systolic heart failure ([facility])     Stable,    ??? Degenerative arthritis of left knee    ??? Fibroid 06/23/2008   ??? History of brain tumor 2013   ??? HLD (hyperlipidemia)    ??? HTN (hypertension) 06/23/2008   ??? Hypotension, unspecified      Related to lisinopril   ??? 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 Left knee pain     With swelling   ??? Leg pain    ??? 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 OSA (obstructive sleep apnea) 9/10   ??? Venous reflux    ??? Wears glasses        Past Surgical History:   Procedure Laterality Date   ??? FULL ESOPHAGEAL MANOMETRY  02/16/2017        ???  HX BREAST BIOPSY      rt breast, benign   ??? HX CHOLECYSTECTOMY     ??? HX CRANIOTOMY  2013    meningioma    ??? HX HEENT      glasses   ??? HX HYSTERECTOMY     ??? HX IMPLANTABLE CARDIOVERTER DEFIBRILLATOR     ??? HX PACEMAKER  2013    dual chamber icd   ??? HX TONSIL AND ADENOIDECTOMY     ??? HX TOTAL ABDOMINAL HYSTERECTOMY  1996   ??? HX TUBAL LIGATION         Family History   Problem Relation Age of Onset   ??? Diabetes Mother    ??? Heart Attack Father         MI   ??? Heart Disease Maternal Grandmother    ??? Heart Disease Paternal Grandmother    ??? Kidney Disease Other         1 sib deceased   ??? Cancer Other         1 sib throat cancer   ??? Diabetes Other    ??? Diabetes Daughter    ??? Other Daughter         leukemia   ??? Hypertension Other        Social History     Tobacco Use   ??? Smoking status: Never Smoker   ??? Smokeless tobacco: Never Used   Substance Use Topics   ??? Alcohol use: No       ROS   Review of Systems   Constitutional: Negative for chills and fever.   HENT: Negative for ear pain and sore throat.    Eyes: Negative for blurred vision and pain.   Respiratory: Positive for shortness of breath (off/on).    Cardiovascular: Positive for chest pain (off/on).        She has pain along the driveline.   Gastrointestinal: Negative for abdominal pain, blood in stool and melena.   Genitourinary: Negative for dysuria and hematuria.   Musculoskeletal: Negative for joint pain and myalgias.   Skin: Negative for rash.   Neurological: Negative for tingling, focal weakness and headaches.   Endo/Heme/Allergies: Does not bruise/bleed easily.    Psychiatric/Behavioral: Negative for substance abuse.       Objective     Vitals:    10/05/17 1013   Resp: 18   Temp: 97.8 ??F (36.6 ??C)   TempSrc: Oral   SpO2: 98%   Weight: 172 lb 3.2 oz (78.1 kg)   Height: 5\' 10"  (1.778 m)   PainSc:   0 - No pain       Physical Exam   Constitutional: She is oriented to person, place, and time and well-developed, well-nourished, and in no distress.   HENT:   Head: Normocephalic and atraumatic.   Right Ear: External ear normal.   Left Ear: External ear normal.   Nose: Nose normal.   Mouth/Throat: No oropharyngeal exudate.   Eyes: Conjunctivae and EOM are normal. Right eye exhibits no discharge. Left eye exhibits no discharge. No scleral icterus.   Neck: Neck supple.   Cardiovascular: Intact distal pulses.   Machine sound is present   Pulmonary/Chest: Effort normal and breath sounds normal. No respiratory distress. She has no wheezes. She has no rales.   Abdominal: Soft. Bowel sounds are normal. She exhibits no distension.   There is no erythema along her driveline bandage.   Musculoskeletal: She exhibits no edema or tenderness (BUE).   Lymphadenopathy:  She has no cervical adenopathy.   Neurological: She is alert and oriented to person, place, and time. She exhibits normal muscle tone. Gait normal.   Skin: Skin is warm and dry. No erythema.   Psychiatric: Affect normal.   Nursing note and vitals reviewed.      LABS   Data Review:   Lab Results   Component Value Date/Time    WBC 4.5 (L) 02/25/2017 11:30 AM    Hemoglobin, POC 11.2 (L) 01/22/2017 01:08 PM    HGB 10.5 (L) 02/25/2017 11:30 AM    Hematocrit, POC 33 (L) 01/22/2017 01:08 PM    HCT 32.6 (L) 02/25/2017 11:30 AM    PLATELET 248 02/25/2017 11:30 AM    MCV 91.3 02/25/2017 11:30 AM       Lab Results   Component Value Date/Time    Sodium 144 02/25/2017 11:30 AM    Potassium 5.0 02/25/2017 11:30 AM    Chloride 109 (H) 02/25/2017 11:30 AM    CO2 27 02/25/2017 11:30 AM    Anion gap 8 02/25/2017 11:30 AM    Glucose 86 02/25/2017  11:30 AM    BUN 21 (H) 02/25/2017 11:30 AM    Creatinine 0.83 02/25/2017 11:30 AM    BUN/Creatinine ratio 25 (H) 02/25/2017 11:30 AM    GFR est AA >60 02/25/2017 11:30 AM    GFR est non-AA >60 02/25/2017 11:30 AM    Calcium 8.2 (L) 02/25/2017 11:30 AM       Lab Results   Component Value Date/Time    Cholesterol, total 161 04/09/2015 08:11 AM    HDL Cholesterol 73 (H) 04/09/2015 08:11 AM    LDL, calculated 79.8 04/09/2015 08:11 AM    VLDL, calculated 8.2 04/09/2015 08:11 AM    Triglyceride 41 04/09/2015 08:11 AM    CHOL/HDL Ratio 2.2 04/09/2015 08:11 AM       Lab Results   Component Value Date/Time    Hemoglobin A1c 5.8 12/09/2012 10:50 AM       Assessment/Plan:   1. HTN/CHF: +LVAD but having persistent pain along her driveline  ???Checking a CRP, CMP, TFTs, lipid panel, and a urine protein screen.  ???Continue with Rx as prescribed.  ???I encouraged her to follow-up with Cardiology    ORDERS:  - METABOLIC PANEL, COMPREHENSIVE; Future  - TSH AND FREE T4; Future  - LIPID PANEL; Future  - MICROALBUMIN, UR, RAND W/ MICROALB/CREAT RATIO; Future  - C REACTIVE PROTEIN, QT; Future    2. Multinodular goiter:   ???I encouraged her to follow-up with ENT.  ???Checking TFTs.    ORDERS:  - TSH AND FREE T4; Future    3. Mixed hyperlipidemia: Stable.   ???Continue with Rx as prescribed.  ???Checking a CMP and lipid panel.    ORDERS:  - METABOLIC PANEL, COMPREHENSIVE; Future  - LIPID PANEL; Future    4. Mild Cognitive Impairment: MOCA is negative for dementia.  ???Observation.  We will repeat a screening for dementia in 1 year.      Health Maintenance Due   Topic Date Due   ??? DTaP/Tdap/Td series (1 - Tdap) 04/12/1971   ??? Shingrix Vaccine Age 58> (1 of 2) 04/11/2000     Lab review: labs are reviewed in the EHR    I have discussed the diagnosis with the patient and the intended plan as seen in the above orders.  The patient has received an after-visit summary and questions were answered concerning future plans.  I have discussed medication side  effects and warnings with the patient as well. I have reviewed the plan of care with the patient, accepted their input and they are in agreement with the treatment goals. All questions were answered. The patient understands the plan of care. Handouts provided today with above information. Pt instructed if symptoms worsen to call the office or report to the ED for continued care.  Greater than 50% of the visit time was spent in counseling and/or coordination of care.      Voice recognition was used to generate this report, which may have resulted in some phonetic based errors in grammar and contents. Even though attempts were made to correct all the mistakes, some may have been missed, and remained in the body of the document.          Leo Rod, MD

## 2017-10-06 ENCOUNTER — Encounter

## 2017-10-06 LAB — CBC WITH AUTOMATED DIFF
ABS. BASOPHILS: 0 10*3/uL (ref 0.0–0.1)
ABS. EOSINOPHILS: 0 10*3/uL (ref 0.0–0.4)
ABS. LYMPHOCYTES: 0.8 10*3/uL — ABNORMAL LOW (ref 0.9–3.6)
ABS. MONOCYTES: 0.4 10*3/uL (ref 0.05–1.2)
ABS. NEUTROPHILS: 4 10*3/uL (ref 1.8–8.0)
BASOPHILS: 0 % (ref 0–2)
EOSINOPHILS: 0 % (ref 0–5)
HCT: 33.2 % — ABNORMAL LOW (ref 35.0–45.0)
HGB: 10 g/dL — ABNORMAL LOW (ref 12.0–16.0)
LYMPHOCYTES: 16 % — ABNORMAL LOW (ref 21–52)
MCH: 27.5 PG (ref 24.0–34.0)
MCHC: 30.1 g/dL — ABNORMAL LOW (ref 31.0–37.0)
MCV: 91.5 FL (ref 74.0–97.0)
MONOCYTES: 7 % (ref 3–10)
MPV: 10.6 FL (ref 9.2–11.8)
NEUTROPHILS: 77 % — ABNORMAL HIGH (ref 40–73)
PLATELET: 261 10*3/uL (ref 135–420)
RBC: 3.63 M/uL — ABNORMAL LOW (ref 4.20–5.30)
RDW: 15.5 % — ABNORMAL HIGH (ref 11.6–14.5)
WBC: 5.2 10*3/uL (ref 4.6–13.2)

## 2017-10-06 LAB — METABOLIC PANEL, COMPREHENSIVE
A-G Ratio: 1.2 (ref 0.8–1.7)
ALT (SGPT): 36 U/L (ref 13–56)
AST (SGOT): 25 U/L (ref 10–38)
Albumin: 4.1 g/dL (ref 3.4–5.0)
Alk. phosphatase: 150 U/L — ABNORMAL HIGH (ref 45–117)
Anion gap: 6 mmol/L (ref 3.0–18)
BUN/Creatinine ratio: 20 (ref 12–20)
BUN: 16 MG/DL (ref 7.0–18)
Bilirubin, total: 0.5 MG/DL (ref 0.2–1.0)
CO2: 30 mmol/L (ref 21–32)
Calcium: 9.1 MG/DL (ref 8.5–10.1)
Chloride: 104 mmol/L (ref 100–111)
Creatinine: 0.79 MG/DL (ref 0.6–1.3)
GFR est AA: >60 mL/min/{1.73_m2} (ref >60)
GFR est non-AA: >60 mL/min/{1.73_m2} (ref >60)
Globulin: 3.5 g/dL (ref 2.0–4.0)
Glucose: 89 mg/dL (ref 74–99)
Potassium: 4.1 mmol/L (ref 3.5–5.5)
Protein, total: 7.6 g/dL (ref 6.4–8.2)
Sodium: 140 mmol/L (ref 136–145)

## 2017-10-06 LAB — C REACTIVE PROTEIN, QT: C-Reactive protein: <0.3 mg/dL (ref 0–0.3)

## 2017-10-06 LAB — TSH AND FREE T4
T4, Free: 1 NG/DL (ref 0.7–1.5)
TSH: 1.23 u[IU]/mL (ref 0.36–3.74)

## 2017-10-06 LAB — LIPID PANEL
CHOL/HDL Ratio: 2.4 (ref 0–5.0)
Chol/HDL Ratio: 2.4 (ref 0–5.0)
Cholesterol, Total: 189 MG/DL (ref <200)
Cholesterol, total: 189 MG/DL (ref <200)
HDL Cholesterol: 80 MG/DL — ABNORMAL HIGH (ref 40–60)
HDL: 80 MG/DL — ABNORMAL HIGH (ref 40–60)
LDL Calculated: 97 MG/DL (ref 0–100)
LDL, calculated: 97 MG/DL (ref 0–100)
Triglyceride: 60 MG/DL (ref <150)
Triglycerides: 60 MG/DL (ref <150)
VLDL Cholesterol Calculated: 12 MG/DL
VLDL, calculated: 12 MG/DL

## 2017-10-06 LAB — MICROALBUMIN, UR, RAND W/ MICROALB/CREAT RATIO
Creatinine, urine random: 143 mg/dL — ABNORMAL HIGH (ref 30–125)
Microalbumin,urine random: 2.7 MG/DL (ref 0–3.0)
Microalbumin/Creat ratio (mg/g creat): 19 mg/g (ref 0–30)

## 2017-10-06 LAB — COMPREHENSIVE METABOLIC PANEL
ALT: 36 U/L (ref 13–56)
AST: 25 U/L (ref 10–38)
Albumin/Globulin Ratio: 1.2 (ref 0.8–1.7)
Albumin: 4.1 g/dL (ref 3.4–5.0)
Alkaline Phosphatase: 150 U/L — ABNORMAL HIGH (ref 45–117)
Anion Gap: 6 mmol/L (ref 3.0–18)
BUN: 16 MG/DL (ref 7.0–18)
Bun/Cre Ratio: 20 (ref 12–20)
CO2: 30 mmol/L (ref 21–32)
Calcium: 9.1 MG/DL (ref 8.5–10.1)
Chloride: 104 mmol/L (ref 100–111)
Creatinine: 0.79 MG/DL (ref 0.6–1.3)
EGFR IF NonAfrican American: >60 mL/min/{1.73_m2} (ref >60)
GFR African American: >60 mL/min/{1.73_m2} (ref >60)
Globulin: 3.5 g/dL (ref 2.0–4.0)
Glucose: 89 mg/dL (ref 74–99)
Potassium: 4.1 mmol/L (ref 3.5–5.5)
Sodium: 140 mmol/L (ref 136–145)
Total Bilirubin: 0.5 MG/DL (ref 0.2–1.0)
Total Protein: 7.6 g/dL (ref 6.4–8.2)

## 2017-10-06 LAB — CBC WITH AUTO DIFFERENTIAL
Basophils %: 0 % (ref 0–2)
Basophils Absolute: 0 10*3/uL (ref 0.0–0.1)
Eosinophils %: 0 % (ref 0–5)
Eosinophils Absolute: 0 10*3/uL (ref 0.0–0.4)
Hematocrit: 33.2 % — ABNORMAL LOW (ref 35.0–45.0)
Hemoglobin: 10 g/dL — ABNORMAL LOW (ref 12.0–16.0)
Lymphocytes %: 16 % — ABNORMAL LOW (ref 21–52)
Lymphocytes Absolute: 0.8 10*3/uL — ABNORMAL LOW (ref 0.9–3.6)
MCH: 27.5 PG (ref 24.0–34.0)
MCHC: 30.1 g/dL — ABNORMAL LOW (ref 31.0–37.0)
MCV: 91.5 FL (ref 74.0–97.0)
MPV: 10.6 FL (ref 9.2–11.8)
Monocytes %: 7 % (ref 3–10)
Monocytes Absolute: 0.4 10*3/uL (ref 0.05–1.2)
Neutrophils %: 77 % — ABNORMAL HIGH (ref 40–73)
Neutrophils Absolute: 4 10*3/uL (ref 1.8–8.0)
Platelets: 261 10*3/uL (ref 135–420)
RBC: 3.63 M/uL — ABNORMAL LOW (ref 4.20–5.30)
RDW: 15.5 % — ABNORMAL HIGH (ref 11.6–14.5)
WBC: 5.2 10*3/uL (ref 4.6–13.2)

## 2017-10-06 LAB — TSH + FREE T4 PANEL
T4 Free: 1 NG/DL (ref 0.7–1.5)
TSH: 1.23 u[IU]/mL (ref 0.36–3.74)

## 2017-10-06 LAB — MICROALBUMIN / CREATININE URINE RATIO
Creatinine, Ur: 143 mg/dL — ABNORMAL HIGH (ref 30–125)
Microalb, Ur: 2.7 MG/DL (ref 0–3.0)
Microalbumin Creatinine Ratio: 19 mg/g (ref 0–30)

## 2017-10-06 LAB — C-REACTIVE PROTEIN: CRP: <0.3 mg/dL (ref 0–0.3)

## 2017-10-06 NOTE — Progress Notes (Signed)
Please let her know that her electrolytes and kidney function labs are unremarkable.  Her TFTs are normal.  Her total cholesterol is 189.  Her triglycerides are 60.  Her HDL is 80.  Her LDL is 97.  She has a chronic anemia with a hemoglobin of 10.  The remainder of her CBC is unremarkable for significant abnormalities.  I will order a FIT test for her to get to rule out any intestinal bleeding.  Please have her get this done.    Dr. Marius Ditch  [facility], [place] [address]  Phone: [phone]  Fax: [phone]

## 2017-10-06 NOTE — Telephone Encounter (Signed)
-----   Message from Leo Rod, MD sent at 10/06/2017  7:25 AM EDT -----  Please let her know that her electrolytes and kidney function labs are unremarkable.  Her TFTs are normal.  Her total cholesterol is 189.  Her triglycerides are 60.  Her HDL is 80.  Her LDL is 97.  She has a chronic anemia with a hemoglobin of 10.  The remainder of her CBC is unremarkable for significant abnormalities.  I will order a FIT test for her to get to rule out any intestinal bleeding.  Please have her get this done.    Dr. Marius Ditch  [facility], [place] [address]  Phone: [phone]  Fax: [phone]

## 2017-10-06 NOTE — Telephone Encounter (Signed)
Pt aware of message below and verbalized understanding. No further questions or concerns from pt at this time.

## 2017-11-10 ENCOUNTER — Encounter

## 2017-11-13 ENCOUNTER — Inpatient Hospital Stay: Admit: 2017-11-13 | Payer: MEDICARE | Attending: Otolaryngology | Primary: Family Medicine

## 2017-11-13 DIAGNOSIS — E041 Nontoxic single thyroid nodule: Secondary | ICD-10-CM

## 2017-11-20 ENCOUNTER — Ambulatory Visit
Admit: 2017-11-20 | Discharge: 2017-11-20 | Payer: MEDICARE | Attending: Obstetrics & Gynecology | Primary: Family Medicine

## 2017-11-20 ENCOUNTER — Ambulatory Visit: Attending: Obstetrics & Gynecology | Primary: Family Medicine

## 2017-11-20 DIAGNOSIS — Z01419 Encounter for gynecological examination (general) (routine) without abnormal findings: Secondary | ICD-10-CM

## 2017-11-20 NOTE — Progress Notes (Signed)
SUBJECTIVE:   67 y.o. female for annual routine Pap and checkup.  Current Outpatient Medications   Medication Sig Dispense Refill   ??? ergocalciferol (ERGOCALCIFEROL) 50,000 unit capsule Take 50,000 Units by mouth every seven (7) days.     ??? aspirin (ASPIRIN) 325 mg tablet Take 325 mg by mouth daily.     ??? warfarin (COUMADIN) 1 mg tablet 3mg  daily     ??? bisacodyl (DULCOLAX) 5 mg EC tablet Take 5 mg by mouth daily as needed.     ??? polyethylene glycol (MIRALAX) 17 gram packet Take 17 g by mouth daily as needed.     ??? atorvastatin (LIPITOR) 40 mg tablet Take 40 mg by mouth daily.     ??? calcium carbonate (OS-CAL) 500 mg calcium (1,250 mg) tablet Take 1,250 mg by mouth daily.     ??? spironolactone (ALDACTONE) 25 mg tablet Take 1 Tab by mouth daily. 90 Tab 3   ??? albuterol (PROVENTIL HFA, VENTOLIN HFA, PROAIR HFA) 90 mcg/actuation inhaler Take 1 Puff by inhalation every four (4) hours as needed for Wheezing. 1 Inhaler 0   ??? fluticasone (FLONASE) 50 mcg/actuation nasal spray 2 Sprays by Both Nostrils route daily. (Patient taking differently: 2 Sprays by Both Nostrils route daily. As needed) 1 Bottle 5   ??? bumetanide (BUMEX) 1 mg tablet Take 1 mg by mouth daily.     ??? oxyCODONE-acetaminophen (PERCOCET) 5-325 mg per tablet Take 1 Tab by mouth every four (4) hours as needed.     ??? trimethoprim-polymyxin b (POLYTRIM) ophthalmic solution Apply 4 Drops to affected area daily. Applied to skin area as directed     ??? ondansetron (ZOFRAN ODT) 4 mg disintegrating tablet Take 4 mg by mouth every eight (8) hours as needed.     ??? melatonin 3 mg tablet Take 6 mg by mouth nightly.     ??? latanoprost (XALATAN) 0.005 % ophthalmic solution Administer 1 Drop to both eyes nightly.     ??? timolol (TIMOPTIC-XE) 0.5 % ophthalmic gel-forming Administer 1 Drop to both eyes daily.       Allergies: Penicillamine; Penicillins; and Tramadol   No LMP recorded (lmp unknown). Patient has had a hysterectomy.     ROS:  Feeling well. No dyspnea or chest pain on exertion.  No abdominal pain, change in bowel habits, black or bloody stools.  No urinary tract symptoms. GYN ROS: normal menses, no abnormal bleeding, pelvic pain or discharge, no breast pain or new or enlarging lumps on self exam. No neurological complaints.    OBJECTIVE:   The patient appears well, alert, oriented x 3, in no distress.  Visit Vitals  LMP  (LMP Unknown)     ENT normal.  Neck supple. No adenopathy or thyromegaly. PERLA. Lungs are clear, good air entry, no wheezes, rhonchi or rales. S1 and S2 normal, no murmurs, regular rate and rhythm. Abdomen soft without tenderness, guarding, mass or organomegaly. Extremities show no edema, normal peripheral pulses. Neurological is normal, no focal findings.    BREAST EXAM: breasts appear normal, no suspicious masses, no skin or nipple changes or axillary nodes    PELVIC EXAM: normal external genitalia, vulva, vagina, cervix, uterus and adnexa    ASSESSMENT:   Well woman  Pap smear no longer needed    PLAN:   Mammogram scheduled, colonoscopy already done and being appropriately followed for this; on Prolia for bone density has follow-up for this as well.

## 2017-11-20 NOTE — Progress Notes (Signed)
SUBJECTIVE:   67 y.o. female for annual routine Pap and checkup.  Current Outpatient Medications   Medication Sig Dispense Refill   ??? ergocalciferol (ERGOCALCIFEROL) 50,000 unit capsule Take 50,000 Units by mouth every seven (7) days.     ??? aspirin (ASPIRIN) 325 mg tablet Take 325 mg by mouth daily.     ??? warfarin (COUMADIN) 1 mg tablet 3mg  daily     ??? bisacodyl (DULCOLAX) 5 mg EC tablet Take 5 mg by mouth daily as needed.     ??? polyethylene glycol (MIRALAX) 17 gram packet Take 17 g by mouth daily as needed.     ??? atorvastatin (LIPITOR) 40 mg tablet Take 40 mg by mouth daily.     ??? calcium carbonate (OS-CAL) 500 mg calcium (1,250 mg) tablet Take 1,250 mg by mouth daily.     ??? spironolactone (ALDACTONE) 25 mg tablet Take 1 Tab by mouth daily. 90 Tab 3   ??? albuterol (PROVENTIL HFA, VENTOLIN HFA, PROAIR HFA) 90 mcg/actuation inhaler Take 1 Puff by inhalation every four (4) hours as needed for Wheezing. 1 Inhaler 0   ??? fluticasone (FLONASE) 50 mcg/actuation nasal spray 2 Sprays by Both Nostrils route daily. (Patient taking differently: 2 Sprays by Both Nostrils route daily. As needed) 1 Bottle 5   ??? bumetanide (BUMEX) 1 mg tablet Take 1 mg by mouth daily.     ??? oxyCODONE-acetaminophen (PERCOCET) 5-325 mg per tablet Take 1 Tab by mouth every four (4) hours as needed.     ??? trimethoprim-polymyxin b (POLYTRIM) ophthalmic solution Apply 4 Drops to affected area daily. Applied to skin area as directed     ??? ondansetron (ZOFRAN ODT) 4 mg disintegrating tablet Take 4 mg by mouth every eight (8) hours as needed.     ??? melatonin 3 mg tablet Take 6 mg by mouth nightly.     ??? latanoprost (XALATAN) 0.005 % ophthalmic solution Administer 1 Drop to both eyes nightly.     ??? timolol (TIMOPTIC-XE) 0.5 % ophthalmic gel-forming Administer 1 Drop to both eyes daily.       Allergies: Penicillamine; Penicillins; and Tramadol   No LMP recorded (lmp unknown). Patient has had a hysterectomy.    ROS:  Feeling well. No dyspnea or chest pain on  exertion.  No abdominal pain, change in bowel habits, black or bloody stools.  No urinary tract symptoms. GYN ROS: normal menses, no abnormal bleeding, pelvic pain or discharge, no breast pain or new or enlarging lumps on self exam. No neurological complaints.    OBJECTIVE:   The patient appears well, alert, oriented x 3, in no distress.  Visit Vitals  LMP  (LMP Unknown)     ENT normal.  Neck supple. No adenopathy or thyromegaly. PERLA. Lungs are clear, good air entry, no wheezes, rhonchi or rales. S1 and S2 normal, no murmurs, regular rate and rhythm. Abdomen soft without tenderness, guarding, mass or organomegaly. Extremities show no edema, normal peripheral pulses. Neurological is normal, no focal findings.    BREAST EXAM: breasts appear normal, no suspicious masses, no skin or nipple changes or axillary nodes    PELVIC EXAM: normal external genitalia, vulva, vagina, cervix, uterus and adnexa    ASSESSMENT:   Well woman  Pap smear no longer needed    PLAN:   Mammogram scheduled, colonoscopy already done and being appropriately followed for this; on Prolia for bone density has follow-up for this as well.

## 2017-11-24 ENCOUNTER — Inpatient Hospital Stay: Admit: 2017-11-24 | Payer: MEDICARE | Primary: Family Medicine

## 2017-11-24 DIAGNOSIS — E041 Nontoxic single thyroid nodule: Secondary | ICD-10-CM

## 2017-11-24 LAB — TSH AND FREE T4
T4, Free: 1 NG/DL (ref 0.7–1.5)
TSH: 0.79 u[IU]/mL (ref 0.36–3.74)

## 2017-11-24 LAB — T3, FREE
T3, Free: 2.4 PG/ML (ref 2.18–3.98)
Triiodothyronine (T3), free: 2.4 PG/ML (ref 2.18–3.98)

## 2017-11-24 LAB — TSH + FREE T4 PANEL
T4 Free: 1 NG/DL (ref 0.7–1.5)
TSH: 0.79 u[IU]/mL (ref 0.36–3.74)

## 2017-11-25 ENCOUNTER — Inpatient Hospital Stay: Admit: 2017-11-25 | Payer: MEDICARE | Attending: Family Medicine | Primary: Family Medicine

## 2017-11-25 DIAGNOSIS — Z1231 Encounter for screening mammogram for malignant neoplasm of breast: Secondary | ICD-10-CM

## 2017-11-26 ENCOUNTER — Inpatient Hospital Stay: Admit: 2017-11-26 | Payer: MEDICARE | Primary: Family Medicine

## 2017-11-26 DIAGNOSIS — D649 Anemia, unspecified: Secondary | ICD-10-CM

## 2017-11-26 NOTE — Progress Notes (Signed)
Please let her know that her FIT test is negative.    Dr. Sherisa Gilvin Anne Jeylin Woodmansee  [facility]  [address], [address]  [place] [address]  Phone: [phone]  Fax: [phone]

## 2017-11-27 LAB — OCCULT BLOOD IMMUNOASSAY,DIAGNOSTIC: Occult blood fecal, by IA: NEGATIVE

## 2017-11-27 LAB — OCCULT BLOOD DIAGNOSTIC: OCCULT BLOOD, FECAL, IA: NEGATIVE

## 2017-11-29 NOTE — Progress Notes (Signed)
Please let her know that her FIT test is negative.    Dr. Matias Thurman Anne Zedrick Springsteen  [facility]  [address], [address]  [place] [address]  Phone: [phone]  Fax: [phone]

## 2017-12-01 NOTE — Telephone Encounter (Signed)
-----   Message from Leo Rod, MD sent at 11/29/2017  3:19 PM EDT -----  Please let her know that her FIT test is negative.    Dr. Marius Ditch  [facility], [place] [address]  Phone: [phone]  Fax: [phone]

## 2017-12-01 NOTE — Telephone Encounter (Signed)
Pt aware of message below and verbalized understanding. No further questions or concerns from pt at this time.

## 2017-12-03 ENCOUNTER — Encounter

## 2018-01-21 ENCOUNTER — Encounter: Payer: MEDICARE | Primary: Family Medicine

## 2018-01-28 ENCOUNTER — Inpatient Hospital Stay: Admit: 2018-01-28 | Payer: MEDICARE | Primary: Family Medicine

## 2018-01-28 DIAGNOSIS — M810 Age-related osteoporosis without current pathological fracture: Secondary | ICD-10-CM

## 2018-01-28 LAB — POC CHEM8
Anion Gap, POC: 15 (ref 10–20)
Anion gap, POC: 15 (ref 10–20)
BUN, POC: 19 MG/DL — ABNORMAL HIGH (ref 7–18)
BUN, POC: 19 MG/DL — ABNORMAL HIGH (ref 7–18)
CO2, POC: 29 MMOL/L — ABNORMAL HIGH (ref 19–24)
Calcium, ionized (POC): 1.18 mmol/L (ref 1.12–1.32)
Calcium, ionized, POC: 1.18 mmol/L (ref 1.12–1.32)
Chloride, POC: 99 MMOL/L — ABNORMAL LOW (ref 100–108)
Chloride, POC: 99 MMOL/L — ABNORMAL LOW (ref 100–108)
Creatinine, POC: 0.8 MG/DL (ref 0.6–1.3)
Creatinine, POC: 0.8 MG/DL (ref 0.6–1.3)
GFR African American: >60 mL/min/{1.73_m2} (ref >60)
GFR Non-African American: >60 mL/min/{1.73_m2} (ref >60)
GFRAA, POC: >60 mL/min/{1.73_m2} (ref >60)
GFRNA, POC: >60 mL/min/{1.73_m2} (ref >60)
Glucose, POC: 116 MG/DL — ABNORMAL HIGH (ref 74–106)
Glucose, POC: 116 MG/DL — ABNORMAL HIGH (ref 74–106)
Hematocrit, POC: 34 % — ABNORMAL LOW (ref 36–49)
Hematocrit, POC: 34 % — ABNORMAL LOW (ref 36–49)
Hemoglobin, POC: 11.6 G/DL — ABNORMAL LOW (ref 12–16)
Hemoglobin, POC: 11.6 G/DL — ABNORMAL LOW (ref 12–16)
Potassium, POC: 3.6 MMOL/L (ref 3.5–5.5)
Potassium, POC: 3.6 MMOL/L (ref 3.5–5.5)
Sodium, POC: 139 MMOL/L (ref 136–145)
Sodium, POC: 139 MMOL/L (ref 136–145)
TCO2, POC: 29 MMOL/L — ABNORMAL HIGH (ref 19–24)

## 2018-01-28 LAB — MAGNESIUM
Magnesium: 1.9 mg/dL (ref 1.6–2.6)
Magnesium: 1.9 mg/dL (ref 1.6–2.6)

## 2018-01-28 LAB — PHOSPHORUS
Phosphorus: 3.5 MG/DL (ref 2.5–4.9)
Phosphorus: 3.5 MG/DL (ref 2.5–4.9)

## 2018-01-28 MED ORDER — DENOSUMAB 60 MG/ML SUB-Q SYRINGE
60 mg/mL | Freq: Once | SUBCUTANEOUS | Status: AC
Start: 2018-01-28 — End: 2018-01-28
  Administered 2018-01-28: 20:00:00 via SUBCUTANEOUS

## 2018-01-28 MED FILL — PROLIA 60 MG/ML SUBCUTANEOUS SYRINGE: 60 mg/mL | SUBCUTANEOUS | Qty: 1

## 2018-01-28 NOTE — Progress Notes (Signed)
[facility] Progress Note    Date: January 28, 2018    Name: Cathy [name]    MRN: [id number]         DOB: 12-05-1950     Prolia injection      [name] to [facility], ambulatory at 1420.    Pt was assessed and education was provided.     Patient has received Prolia injections in the past without adverse effects. She confirms she is taking oral calcium and Vit D supplements.     [name]'s vitals were reviewed and patient was observed for 5 minutes prior to treatment.   Visit Vitals  BP 120/81 (BP 1 Location: Left leg, BP Patient Position: Sitting)   Pulse 69   Temp 97.6 ??F (36.4 ??C)   Resp 18   SpO2 99%   Breastfeeding No       Blood obtained peripherally from right AC with butterfly needle without difficulty and sent to lab for Mg and Phos per written orders. BMP run on iSTAT. No bleeding or hematoma noted at site. Guaze and coban applied.    Lab results were obtained and reviewed.  Recent Results (from the past 12 hour(s))   POC CHEM8    Collection Time: 01/28/18  2:24 PM   Result Value Ref Range    CO2, POC 29 (H) 19 - 24 MMOL/L    Glucose, POC 116 (H) 74 - 106 MG/DL    BUN, POC 19 (H) 7 - 18 MG/DL    Creatinine, POC 0.8 0.6 - 1.3 MG/DL    GFRAA, POC >60 >60 ml/min/1.33m2    GFRNA, POC >60 >60 ml/min/1.12m2    Sodium, POC 139 136 - 145 MMOL/L    Potassium, POC 3.6 3.5 - 5.5 MMOL/L    Calcium, ionized (POC) 1.18 1.12 - 1.32 mmol/L    Chloride, POC 99 (L) 100 - 108 MMOL/L    Anion gap, POC 15 10 - 20      Hematocrit, POC 34 (L) 36 - 49 %    Hemoglobin, POC 11.6 (L) 12 - 16 G/DL       Ionized calcium results WNL and within ordered parameters to give Prolia today.     Prolia 60mg  was administered subcutaneous back of L upper arm. No irritation or bleeding noted at site. Bandaid applied.      [name] tolerated well without complaints. Patient armband removed and shredded.    [name] was discharged from [facility] in stable  condition at 1500. She is to return on 08/18/17 at 1400 for her next Prolia appointment.    Clent Jacks, RN  January 28, 2018

## 2018-01-28 NOTE — Progress Notes (Signed)
[facility] Progress Note    Date: January 28, 2018    Name: Cathy [name]    MRN: [id number]         DOB: 1950-09-28     Prolia injection      [name] to [employer], ambulatory at 1420.    Pt was assessed and education was provided.     Patient has received Prolia injections in the past without adverse effects. She confirms she is taking oral calcium and Vit D supplements.     [name] vitals were reviewed and patient was observed for 5 minutes prior to treatment.   Visit Vitals  BP 120/81 (BP 1 Location: Left leg, BP Patient Position: Sitting)   Pulse 69   Temp 97.6 F (36.4 C)   Resp 18   SpO2 99%   Breastfeeding No       Blood obtained peripherally from right AC with butterfly needle without difficulty and sent to lab for Mg and Phos per written orders. BMP run on iSTAT. No bleeding or hematoma noted at site. Guaze and coban applied.    Lab results were obtained and reviewed.  Recent Results (from the past 12 hour(s))   POC CHEM8    Collection Time: 01/28/18  2:24 PM   Result Value Ref Range    CO2, POC 29 (H) 19 - 24 MMOL/L    Glucose, POC 116 (H) 74 - 106 MG/DL    BUN, POC 19 (H) 7 - 18 MG/DL    Creatinine, POC 0.8 0.6 - 1.3 MG/DL    GFRAA, POC >82 >95 AO/ZHY/8.65H8    GFRNA, POC >60 >60 ml/min/1.46m2    Sodium, POC 139 136 - 145 MMOL/L    Potassium, POC 3.6 3.5 - 5.5 MMOL/L    Calcium, ionized (POC) 1.18 1.12 - 1.32 mmol/L    Chloride, POC 99 (L) 100 - 108 MMOL/L    Anion gap, POC 15 10 - 20      Hematocrit, POC 34 (L) 36 - 49 %    Hemoglobin, POC 11.6 (L) 12 - 16 G/DL       Ionized calcium results WNL and within ordered parameters to give Prolia today.     Prolia 60mg  was administered subcutaneous back of L upper arm. No irritation or bleeding noted at site. Bandaid applied.      [name] tolerated well without complaints. Patient armband removed and shredded.    [name] at 1500. She is to return on 08/18/17 at 1400 for her  next Prolia appointment.    Tandy Gaw, RN  January 28, 2018

## 2018-04-07 ENCOUNTER — Encounter: Attending: Family Medicine | Primary: Family Medicine

## 2018-04-22 LAB — HEMOGLOBIN A1C
Estimated Avg Glucose, External: 120 mg/dL (ref 91–123)
Hemoglobin A1C, External: 5.8 % — ABNORMAL HIGH (ref 4.8–5.6)

## 2018-05-07 NOTE — Telephone Encounter (Signed)
Last fill 05/12/2017    Last ov 10/05/2017

## 2018-05-09 MED ORDER — SPIRONOLACTONE 25 MG TAB
25 mg | ORAL_TABLET | ORAL | 2 refills | Status: DC
Start: 2018-05-09 — End: 2019-02-04

## 2018-05-12 ENCOUNTER — Encounter: Attending: Family Medicine | Primary: Family Medicine

## 2018-05-31 ENCOUNTER — Encounter: Attending: Family Medicine | Primary: Family Medicine

## 2018-05-31 NOTE — Progress Notes (Deleted)
Cathy [name] is a 68 y.o. female who was seen by synchronous (real-time) audio-video technology on 05/31/2018.        Assessment & Plan:   There are no diagnoses linked to this encounter.        Lab review: labs are reviewed in the EHR***     I have discussed the diagnosis with the patient and the intended plan as seen in the above orders. I have discussed medication side effects and warnings with the patient as well. I have reviewed the plan of care with the patient, accepted their input and they are in agreement with the treatment goals. All questions were answered. The patient understands the plan of care. Pt instructed if symptoms worsen to call the office or report to the ED for continued care.  Greater than 50% of the visit time was spent in counseling and/or coordination of care.***     Voice recognition was used to generate this report, which may have resulted in some phonetic based errors in grammar and contents. Even though attempts were made to correct all the mistakes, some may have been missed, and remained in the body of the document.      Subjective:   Cathy [name] was seen for No chief complaint on file.    The patient is a 68 year old female with history of hypertension, chronic systolic heart failure??(followed by Dr.McCray) with EF of 10%, mitral regurgitation, cholelithiasis, multinodular goiter, obstructive sleep apnea??(not on rx), allergic rhinitis, osteoporosis, uterine fibroids per EHR, insomnia, cataracts,??dysphagia from achalasia?,??LVAD placement in 2019,??AICD placement,??left Baker's cyst,??hyperlipidemia, and meningioma status post resection.  ??  1. CHF/HTN: S/p LVAD placement.  Taking spironolactone, aspirin, Lipitor, and Coumadin no bleeding.*** SOB: ***. CP: ***. No drainage along her "drive line."***     2. Multinodular Goiter: Her last ultrasound was in 2019. Findings are listed below. Her last TFTs were normal in from 10/19.   ????   11/13/17 Thyroid Ultrasound: Persistent left thyroid lobe nodules.  The more cephalad nodule is entirely solid.  Although in the absence of the prior studies, this nodule would have been considered at [facility] 4-5 lesion, the overall long term stability favors a probably benign or low suspicion finding especially in light of benign FNA results from 04/09/16.  Recommend continued followup ultrasound in 1 year.  The more inferior nodule is of less degree of suspicion in light of cystic component centrally. TIRADS 3.      03/25/17 Thyroid Ultrasound: There are 2 left-sided thyroid nodules.  The solid nodule is similar or minimally increased in size from 1 year prior. Recommend continued sonographic follow-up as this corresponds to the nodule which was sampled with FNA which demonstrated no malignancy.  The additional left-sided nodule is minimally increased in size, but the increase in size appears predominantly within the cystic component. Attention on follow-up is also advised for this nodule in one year.  ??    1. ***:  Duration of sx: ***. Pain: ***. Alleviating factors: ***.Aggravating factors: ***. Triggers: ***. Associated sx: ***.     2. ***: ***. Taking: ***. No adverse side effects from this rx.***       Prior to Admission medications    Medication Sig Start Date End Date Taking? Authorizing Provider   spironolactone (ALDACTONE) 25 mg tablet TAKE ONE TABLET BY MOUTH DAILY 05/09/18   Leo Rod, MD   ergocalciferol (ERGOCALCIFEROL) 50,000 unit capsule Take 50,000 Units by mouth every seven (7) days. 09/25/17   Provider,  Historical   aspirin (ASPIRIN) 325 mg tablet Take 325 mg by mouth daily. 04/30/17   Provider, Historical   warfarin (COUMADIN) 1 mg tablet 3mg  daily 05/11/17   Provider, Historical   bisacodyl (DULCOLAX) 5 mg EC tablet Take 5 mg by mouth daily as needed. 05/01/17   Provider, Historical   polyethylene glycol (MIRALAX) 17 gram packet Take 17 g by mouth daily as needed. 05/01/17   Provider, Historical    atorvastatin (LIPITOR) 40 mg tablet Take 40 mg by mouth daily. 04/30/17   Provider, Historical   calcium carbonate (OS-CAL) 500 mg calcium (1,250 mg) tablet Take 1,250 mg by mouth daily. 05/01/17   Provider, Historical     Allergies   Allergen Reactions   ??? Penicillamine Itching   ??? Penicillins Rash and Itching     Itching bumps on hands and arms  Itching bumps on hands and arms  Itching bumps on hands and arms  Has patient had a PCN reaction causing immediate rash, facial/tongue/throat swelling, SOB or lightheadedness with hypotension: Yes  Has patient had a PCN reaction causing severe rash involving mucus membranes or skin necrosis: No  Has patient had a PCN reaction that required hospitalization unknown  Has patient had a PCN reaction occurring within the last 10 years: No  If all of the above answers are "NO", then may proceed with Cephalosporin use.    Itching bumps on hands and arms   ??? Tramadol Other (comments)     Psychotic Hallucinations  Psychotic Hallucinations  hallucinations  Psychotic Hallucinations     Past Medical History:   Diagnosis Date   ??? Age related osteoporosis    ??? Automatic implantable cardiac defibrillator in situ     Post ddd icd Set up carelink   ??? Baker cyst, left 12/2016   ??? Breast mass, left     benign   ??? CHF (congestive heart failure) ([place]) 06/23/2008    Non-ischemic CMP , Cath (2008) Normal coronary arteries   ??? Chronic systolic heart failure (HCC)     Stable,    ??? Degenerative arthritis of left knee    ??? Fibroid 06/23/2008   ??? History of brain tumor 2013   ??? HLD (hyperlipidemia)    ??? HTN (hypertension) 06/23/2008   ??? Hypotension, unspecified     Related to lisinopril   ??? 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 Left knee pain     With swelling   ??? Leg pain    ??? LVAD (left ventricular assist device) present ([place]) 03/2017   ??? 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 OSA (obstructive sleep apnea) 9/10   ??? Venous reflux     ??? Wears glasses      Past Surgical History:   Procedure Laterality Date   ??? FULL ESOPHAGEAL MANOMETRY  02/16/2017        ??? HX BREAST BIOPSY      rt breast, benign   ??? HX CHOLECYSTECTOMY     ??? HX CRANIOTOMY  2013    meningioma    ??? HX HEENT      glasses   ??? HX HYSTERECTOMY     ??? HX IMPLANTABLE CARDIOVERTER DEFIBRILLATOR     ??? HX PACEMAKER  2013    dual chamber icd   ??? HX TONSIL AND ADENOIDECTOMY     ??? HX TOTAL ABDOMINAL HYSTERECTOMY  1996   ??? HX  TUBAL LIGATION       Family History   Problem Relation Age of Onset   ??? Diabetes Mother    ??? Heart Attack Father         MI   ??? Heart Disease Maternal Grandmother    ??? Heart Disease Paternal Grandmother    ??? Kidney Disease Other         1 sib deceased   ??? Cancer Other         1 sib throat cancer   ??? Diabetes Other    ??? Diabetes Daughter    ??? Other Daughter         leukemia   ??? Hypertension Other      Social History     Socioeconomic History   ??? Marital status: MARRIED     Spouse name: Not on file   ??? Number of children: Not on file   ??? Years of education: Not on file   ??? Highest education level: Not on file   Occupational History   ??? Occupation: home health aide   Tobacco Use   ??? Smoking status: Never Smoker   ??? Smokeless tobacco: Never Used   Substance and Sexual Activity   ??? Alcohol use: No   ??? Drug use: No   ??? Sexual activity: Yes     Partners: Male     Birth control/protection: Condom   Other Topics Concern       ROS:  Gen: No fever/chills  HEENT: No sore throat, eye pain, ear pain, or congestion. No HA  CV: No CP  Resp: No cough/SOB  GI: No abdominal pain  GU: No hematuria/dysuria  Derm: No rash  Neuro: No new paresthesias/weakness  Musc: No new myalgias/jt pain  Psych: No depression sx      Objective:     General: {gen appear:16600::"alert","cooperative","no distress"}   Mental  status: {mental status:313008::"alert, oriented to person, place, and time","normal mood, behavior, speech, dress, motor activity, and thought processes":1}    Resp: {resp:28375::"normal effort","no respiratory distress":1}   Neuro: {neuro:28376::"no gross deficits":1}   Skin: {skin:315960::"no discoloration or lesions of concern on visible areas":1}     LABS:  Lab Results   Component Value Date/Time    Sodium 140 10/05/2017 10:40 AM    Potassium 4.1 10/05/2017 10:40 AM    Chloride 104 10/05/2017 10:40 AM    CO2 30 10/05/2017 10:40 AM    Anion gap 6 10/05/2017 10:40 AM    Glucose 89 10/05/2017 10:40 AM    BUN 16 10/05/2017 10:40 AM    Creatinine 0.79 10/05/2017 10:40 AM    BUN/Creatinine ratio 20 10/05/2017 10:40 AM    GFR est AA >60 10/05/2017 10:40 AM    GFR est non-AA >60 10/05/2017 10:40 AM    Calcium 9.1 10/05/2017 10:40 AM       Lab Results   Component Value Date/Time    Cholesterol, total 189 10/05/2017 10:40 AM    HDL Cholesterol 80 (H) 10/05/2017 10:40 AM    LDL, calculated 97 10/05/2017 10:40 AM    VLDL, calculated 12 10/05/2017 10:40 AM    Triglyceride 60 10/05/2017 10:40 AM    CHOL/HDL Ratio 2.4 10/05/2017 10:40 AM       Lab Results   Component Value Date/Time    WBC 5.2 10/05/2017 10:40 AM    Hemoglobin, POC 11.6 (L) 01/28/2018 02:24 PM    HGB 10.0 (L) 10/05/2017 10:40 AM    Hematocrit, POC 34 (L) 01/28/2018 02:24 PM  HCT 33.2 (L) 10/05/2017 10:40 AM    PLATELET 261 10/05/2017 10:40 AM    MCV 91.5 10/05/2017 10:40 AM       Lab Results   Component Value Date/Time    Hemoglobin A1c 5.8 12/09/2012 10:50 AM       Lab Results   Component Value Date/Time    TSH 0.79 11/24/2017 02:40 PM           Due to this being a TeleHealth evaluation, many elements of the physical examination are unable to be assessed.     We discussed the expected course, resolution and complications of the diagnosis(es) in detail.  Medication risks, benefits, costs, interactions, and alternatives were discussed as indicated.  I advised her to contact the office if her condition worsens, changes or fails to improve as anticipated. She expressed understanding with the diagnosis(es) and plan.      Pursuant to the emergency declaration under the [place], 1135 waiver authority and the [employer] Act, this Virtual  Visit was conducted, with patient's consent, to reduce the patient's risk of exposure to COVID-19 and provide continuity of care for an established patient.     Services were provided through a video synchronous discussion virtually to substitute for in-person clinic visit.    Leo Rod, MD

## 2018-08-17 ENCOUNTER — Encounter: Attending: Family Medicine | Primary: Family Medicine

## 2018-08-17 ENCOUNTER — Telehealth: Admit: 2018-08-17 | Discharge: 2018-08-17 | Payer: MEDICARE | Attending: Family Medicine | Primary: Family Medicine

## 2018-08-17 ENCOUNTER — Telehealth: Attending: Family Medicine | Primary: Family Medicine

## 2018-08-17 DIAGNOSIS — K5900 Constipation, unspecified: Secondary | ICD-10-CM

## 2018-08-17 NOTE — Progress Notes (Signed)
Cathy [name] is a 68 y.o. female who was seen by synchronous (real-time) audio-video technology on 08/17/2018.        Assessment & Plan:   1. CHF/HTN: Stable. +LVAD  ???Continue to follow-up with Cardiology  ???Continue with Rx per their recommendations.  ???I will check a CMP, urine protein screen, CBC, lipid panel, and A1c just before her follow-up appointment.    2. Constipation: Her diet is limited 2/2 being on chronic anticoagulation with Coumadin.  ???I encouraged her to use Metamucil and drink plenty of water each day.  ???Referral to GI for additional recommendations.    ORDERS:  - REFERRAL TO GASTROENTEROLOGY    3.  Hyperlipidemia: Stable.  ???Continue with Rx as prescribed.  ???I will check labs as mentioned above in 3 months.    4.  Multinodular Goiter History:  ???I encouraged her to follow-up with her specialist.  Meanwhile, I will check a CBC and TFTs in 3 months.      Lab review: labs are reviewed in the EHR and ordered as mentioned above     I have discussed the diagnosis with the patient and the intended plan as seen in the above orders. I have discussed medication side effects and warnings with the patient as well. I have reviewed the plan of care with the patient, accepted their input and they are in agreement with the treatment goals. All questions were answered. The patient understands the plan of care. Pt instructed if symptoms worsen to call the office or report to the ED for continued care.  Greater than 50% of the visit time was spent in counseling and/or coordination of care.     Voice recognition was used to generate this report, which may have resulted in some phonetic based errors in grammar and contents. Even though attempts were made to correct all the mistakes, some may have been missed, and remained in the body of the document.      Subjective:   Cathy [name] was seen for   Chief Complaint   Patient presents with   ??? Follow-up      The patient is a 68 year old female with history of hypertension, chronic systolic heart failure??(followed by Dr.McCray) with EF of 10%, mitral regurgitation, cholelithiasis, multinodular goiter, obstructive sleep apnea??(not on rx), allergic rhinitis, osteoporosis, uterine fibroids per EHR, insomnia, cataracts,??dysphagia from achalasia?,??LVAD placement in 2019,??AICD placement,??left Baker's cyst,??hyperlipidemia, and meningioma status post resection.    1.  CHF/Hypertension: +LVAD, followed by Cardiology. On aspirin, Bumex, Coumadin, and Aldactone. Bleeding: she has bright red blood per rectum - see #4.  SOB/CP: unchanged and chronic. LVAD issues: none. She was placed on gabapentin for neuropathic pain along her drive line (300mg  tid prescribed but she only takes it prn).    2. HLD: Taking Lipitor. Diet: limited by taking coumadin.  Her blood glucose has been elevated on previous BMPs regularly.    3. Multinodular Goiter: Followed by ENT.  Her last ultrasound was done in January 2019.  Findings are listed below. She was told not to f/u with them. A f/u ultrasound was not recommended. No dysphagia.   ????  03/25/17 Thyroid Ultrasound: There are 2 left-sided thyroid nodules.  The solid nodule is similar or minimally increased in size from 1 year prior. Recommend continued sonographic follow-up as this corresponds to the nodule which was sampled with FNA which demonstrated no malignancy.  The additional left-sided nodule is minimally increased in size, but the increase in size appears predominantly within the  cystic component. Attention on follow-up is also advised for this nodule in one year.  ??  4. Constipation: She takes an OTC laxative in order to have a BM - and has been doing this for the past 1-2 months. She has straining and BRBPR.  Her FIT test was unremarkable last year. She drinks plenty of water on a daily basis. Diet: she is not consuming a lot of fiber in her diet. She has  nausea but no vomiting. She has some abdominal cramping - lower abdomen - off/on but sx improve with having a BM.     Prior to Admission medications    Medication Sig Start Date End Date Taking? Authorizing Provider   spironolactone (ALDACTONE) 25 mg tablet TAKE ONE TABLET BY MOUTH DAILY 05/09/18   Leo Rod, MD   ergocalciferol (ERGOCALCIFEROL) 50,000 unit capsule Take 50,000 Units by mouth every seven (7) days. 09/25/17   Provider, Historical   aspirin (ASPIRIN) 325 mg tablet Take 325 mg by mouth daily. 04/30/17   Provider, Historical   warfarin (COUMADIN) 1 mg tablet 3mg  daily 05/11/17   Provider, Historical   bisacodyl (DULCOLAX) 5 mg EC tablet Take 5 mg by mouth daily as needed. 05/01/17   Provider, Historical   polyethylene glycol (MIRALAX) 17 gram packet Take 17 g by mouth daily as needed. 05/01/17   Provider, Historical   atorvastatin (LIPITOR) 40 mg tablet Take 40 mg by mouth daily. 04/30/17   Provider, Historical   calcium carbonate (OS-CAL) 500 mg calcium (1,250 mg) tablet Take 1,250 mg by mouth daily. 05/01/17   Provider, Historical     Allergies   Allergen Reactions   ??? Penicillamine Itching   ??? Penicillins Rash and Itching     Itching bumps on hands and arms  Itching bumps on hands and arms  Itching bumps on hands and arms  Has patient had a PCN reaction causing immediate rash, facial/tongue/throat swelling, SOB or lightheadedness with hypotension: Yes  Has patient had a PCN reaction causing severe rash involving mucus membranes or skin necrosis: No  Has patient had a PCN reaction that required hospitalization unknown  Has patient had a PCN reaction occurring within the last 10 years: No  If all of the above answers are "NO", then may proceed with Cephalosporin use.    Itching bumps on hands and arms   ??? Tramadol Other (comments)     Psychotic Hallucinations  Psychotic Hallucinations  hallucinations  Psychotic Hallucinations     Past Medical History:   Diagnosis Date   ??? Age related osteoporosis     ??? Automatic implantable cardiac defibrillator in situ     Post ddd icd Set up [facility]   ??? Baker cyst, left 12/2016   ??? Breast mass, left     benign   ??? CHF (congestive heart failure) ([facility]) 06/23/2008    Non-ischemic CMP , Cath (2008) Normal coronary arteries   ??? Chronic systolic heart failure ([facility])     Stable,    ??? Degenerative arthritis of left knee    ??? Fibroid 06/23/2008   ??? History of brain tumor 2013   ??? HLD (hyperlipidemia)    ??? HTN (hypertension) 06/23/2008   ??? Hypotension, unspecified     Related to lisinopril   ??? 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 Left knee pain     With swelling   ??? Leg pain    ??? LVAD (left ventricular assist device)  present ([facility]) 03/2017   ??? 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 OSA (obstructive sleep apnea) 9/10   ??? Venous reflux    ??? Wears glasses      Past Surgical History:   Procedure Laterality Date   ??? FULL ESOPHAGEAL MANOMETRY  02/16/2017        ??? HX BREAST BIOPSY      rt breast, benign   ??? HX CHOLECYSTECTOMY     ??? HX CRANIOTOMY  2013    meningioma    ??? HX HEENT      glasses   ??? HX HYSTERECTOMY     ??? HX IMPLANTABLE CARDIOVERTER DEFIBRILLATOR     ??? HX PACEMAKER  2013    dual chamber icd   ??? HX TONSIL AND ADENOIDECTOMY     ??? HX TOTAL ABDOMINAL HYSTERECTOMY  1996   ??? HX TUBAL LIGATION       Family History   Problem Relation Age of Onset   ??? Diabetes Mother    ??? Heart Attack Father         MI   ??? Heart Disease Maternal Grandmother    ??? Heart Disease Paternal Grandmother    ??? Kidney Disease Other         1 sib deceased   ??? Cancer Other         1 sib throat cancer   ??? Diabetes Other    ??? Diabetes Daughter    ??? Other Daughter         leukemia   ??? Hypertension Other      Social History     Socioeconomic History   ??? Marital status: MARRIED     Spouse name: Not on file   ??? Number of children: Not on file   ??? Years of education: Not on file   ??? Highest education level: Not on file   Occupational History    ??? Occupation: home health aide   Tobacco Use   ??? Smoking status: Never Smoker   ??? Smokeless tobacco: Never Used   Substance and Sexual Activity   ??? Alcohol use: No   ??? Drug use: No   ??? Sexual activity: Yes     Partners: Male     Birth control/protection: Condom   Other Topics Concern       ROS:  Gen: No fever/chills  HEENT: No sore throat, eye pain, ear pain, or congestion. No HA  CV: + CP - chronic and unchanged.  Resp: No cough.+SOB  GI: + abdominal pain  GU: No hematuria/dysuria  Derm: No rash  Neuro: No new paresthesias/weakness  Musc: No new myalgias/jt pain  Psych: No depression sx      Objective:     General: alert, cooperative, no distress   Mental  status: mental status: alert, oriented to person, place, and time, normal mood, behavior, speech, dress, motor activity, and thought processes   Resp: resp: normal effort and no respiratory distress   Neuro: neuro: no gross deficits   Skin: skin: no discoloration or lesions of concern on visible areas     LABS:  Lab Results   Component Value Date/Time    Sodium 140 10/05/2017 10:40 AM    Potassium 4.1 10/05/2017 10:40 AM    Chloride 104 10/05/2017 10:40 AM    CO2 30 10/05/2017 10:40 AM    Anion gap 6 10/05/2017 10:40 AM    Glucose 89 10/05/2017 10:40 AM  BUN 16 10/05/2017 10:40 AM    Creatinine 0.79 10/05/2017 10:40 AM    BUN/Creatinine ratio 20 10/05/2017 10:40 AM    GFR est AA >60 10/05/2017 10:40 AM    GFR est non-AA >60 10/05/2017 10:40 AM    Calcium 9.1 10/05/2017 10:40 AM       Lab Results   Component Value Date/Time    Cholesterol, total 189 10/05/2017 10:40 AM    HDL Cholesterol 80 (H) 10/05/2017 10:40 AM    LDL, calculated 97 10/05/2017 10:40 AM    VLDL, calculated 12 10/05/2017 10:40 AM    Triglyceride 60 10/05/2017 10:40 AM    CHOL/HDL Ratio 2.4 10/05/2017 10:40 AM       Lab Results   Component Value Date/Time    WBC 5.2 10/05/2017 10:40 AM    Hemoglobin, POC 11.6 (L) 01/28/2018 02:24 PM    HGB 10.0 (L) 10/05/2017 10:40 AM     Hematocrit, POC 34 (L) 01/28/2018 02:24 PM    HCT 33.2 (L) 10/05/2017 10:40 AM    PLATELET 261 10/05/2017 10:40 AM    MCV 91.5 10/05/2017 10:40 AM       Lab Results   Component Value Date/Time    Hemoglobin A1c 5.8 12/09/2012 10:50 AM       Lab Results   Component Value Date/Time    TSH 0.79 11/24/2017 02:40 PM           Due to this being a TeleHealth  evaluation, many elements of the physical examination are unable to be assessed.     The pt was seen by synchronous (real-time) audio-video technology, and/or her healthcare decision maker, is aware that this patient-initiated, Telehealth encounter is a billable service, with coverage as determined by her insurance carrier. She is aware that she may receive a bill and has provided verbal consent to proceed: Yes.     The pt is being evaluated by a video visit encounter for concerns as above. A caregiver was present when appropriate. Due to this being a Scientist, physiological (During NUUVO-53 public health emergency), evaluation of the following organ systems was limited: Vitals/Constitutional/EENT/Resp/CV/GI/GU/MS/Neuro/Skin/Heme-Lymph-Imm.   Pursuant to the emergency declaration under the [place], 1135 waiver authority and the [employer] Act, this Virtual  Visit was conducted, with patient's (and/or legal guardian's) consent, to reduce the patient's risk of exposure to COVID-19 and provide necessary medical care.     Services were provided through a video synchronous discussion virtually to substitute for in-person clinic visit. Patient and provider were located at their individual homes.      We discussed the expected course, resolution and complications of the diagnosis(es) in detail.  Medication risks, benefits, costs, interactions, and alternatives were discussed as indicated.  I advised her to contact the office if her condition worsens, changes or fails to improve as  anticipated. She expressed understanding with the diagnosis(es) and plan.     Leo Rod, MD

## 2018-08-17 NOTE — Progress Notes (Signed)
Ok to give prolia without checking her blood pressure since she has an LVAD.    Dr. Marius Ditch  [facility], [place] [address]  Phone: [phone]  Fax: [phone]

## 2018-08-17 NOTE — Progress Notes (Signed)
[facility]:  08/23/18, [id number]      The Subsequent Medicare Annual Wellness Exam PROGRESS NOTE    This is a Subsequent Medicare Annual Wellness Exam (AWV).    I have reviewed the patient's medical history in detail and updated the computerized patient record.     Cathy [name] is a 68 y.o. African American female and presents for an annual wellness exam.    SUBJECTIVE    Past Medical History:   Diagnosis Date   ??? Age related osteoporosis    ??? Automatic implantable cardiac defibrillator in situ     Post ddd icd Set up [facility]   ??? Baker cyst, left 12/2016   ??? Breast mass, left     benign   ??? CHF (congestive heart failure) ([facility]) 06/23/2008    Non-ischemic CMP , Cath (2008) Normal coronary arteries   ??? Chronic systolic heart failure (HCC)     Stable,    ??? Degenerative arthritis of left knee    ??? Fibroid 06/23/2008   ??? History of brain tumor 2013   ??? HLD (hyperlipidemia)    ??? HTN (hypertension) 06/23/2008   ??? Hypotension, unspecified     Related to lisinopril   ??? 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 Left knee pain     With swelling   ??? Leg pain    ??? LVAD (left ventricular assist device) present ([place]) 03/2017   ??? 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 OSA (obstructive sleep apnea) 9/10   ??? Venous reflux    ??? Wears glasses       Past Surgical History:   Procedure Laterality Date   ??? FULL ESOPHAGEAL MANOMETRY  02/16/2017        ??? HX BREAST BIOPSY      rt breast, benign   ??? HX CHOLECYSTECTOMY     ??? HX CRANIOTOMY  2013    meningioma    ??? HX HEENT      glasses   ??? HX HYSTERECTOMY     ??? HX IMPLANTABLE CARDIOVERTER DEFIBRILLATOR     ??? HX PACEMAKER  2013    dual chamber icd   ??? HX TONSIL AND ADENOIDECTOMY     ??? HX TOTAL ABDOMINAL HYSTERECTOMY  1996   ??? HX TUBAL LIGATION       Current Outpatient Medications   Medication Sig Dispense Refill   ??? spironolactone (ALDACTONE) 25 mg tablet TAKE ONE TABLET BY MOUTH DAILY 90 Tab 2    ??? ergocalciferol (ERGOCALCIFEROL) 50,000 unit capsule Take 50,000 Units by mouth every seven (7) days.     ??? aspirin (ASPIRIN) 325 mg tablet Take 325 mg by mouth daily.     ??? warfarin (COUMADIN) 1 mg tablet 3mg  daily     ??? bisacodyl (DULCOLAX) 5 mg EC tablet Take 5 mg by mouth daily as needed.     ??? polyethylene glycol (MIRALAX) 17 gram packet Take 17 g by mouth daily as needed.     ??? atorvastatin (LIPITOR) 40 mg tablet Take 40 mg by mouth daily.     ??? calcium carbonate (OS-CAL) 500 mg calcium (1,250 mg) tablet Take 1,250 mg by mouth daily.       Allergies   Allergen Reactions   ??? Penicillamine Itching   ??? Penicillins Rash and Itching     Itching bumps on hands and arms  Itching bumps on hands  and arms  Itching bumps on hands and arms  Has patient had a PCN reaction causing immediate rash, facial/tongue/throat swelling, SOB or lightheadedness with hypotension: Yes  Has patient had a PCN reaction causing severe rash involving mucus membranes or skin necrosis: No  Has patient had a PCN reaction that required hospitalization unknown  Has patient had a PCN reaction occurring within the last 10 years: No  If all of the above answers are "NO", then may proceed with Cephalosporin use.    Itching bumps on hands and arms   ??? Tramadol Other (comments)     Psychotic Hallucinations  Psychotic Hallucinations  hallucinations  Psychotic Hallucinations     Family History   Problem Relation Age of Onset   ??? Diabetes Mother    ??? Heart Attack Father         MI   ??? Heart Disease Maternal Grandmother    ??? Heart Disease Paternal Grandmother    ??? Kidney Disease Other         1 sib deceased   ??? Cancer Other         1 sib throat cancer   ??? Diabetes Other    ??? Diabetes Daughter    ??? Other Daughter         leukemia   ??? Hypertension Other      Social History     Tobacco Use   ??? Smoking status: Never Smoker   ??? Smokeless tobacco: Never Used   Substance Use Topics   ??? Alcohol use: No     Patient Active Problem List   Diagnosis Code    ??? Fibroid D21.9   ??? HTN (hypertension) I10   ??? Gallstones K80.20   ??? Insomnia G47.00   ??? OSA (obstructive sleep apnea) G47.33   ??? Cataract H26.9   ??? AICD (automatic cardioverter/defibrillator) present Z95.810   ??? Chronic systolic heart failure (HCC) I50.22   ??? Automatic implantable cardioverter-defibrillator in situ Z95.810   ??? Mixed hyperlipidemia E78.2   ??? AR (allergic rhinitis) J30.9   ??? Non-rheumatic mitral regurgitation I34.0   ??? Age-related osteoporosis without current pathological fracture M81.0   ??? Multinodular goiter E04.2   ??? History of meningioma Z86.018   ??? 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    Health Maintenance History  Immunizations reviewed:   Tdap over-due per our records.    Pneumovax: up to date   Flu: due later this year  Zoster: over-due    Immunization History   Administered Date(s) Administered   ??? Hep B Vaccine (Adult) 11/12/2017   ??? Hep B, Adol/Ped 05/14/2017, 06/10/2017   ??? Hib (HbOC) 09/23/2017   ??? Influenza High Dose Vaccine PF 02/06/2016   ??? Influenza Vaccine 11/27/2008, 12/19/2009, 11/11/2011, 01/27/2013, 01/27/2013, 02/27/2014, 02/06/2016, 11/12/2016, 11/12/2017, 12/08/2017   ??? Influenza Vaccine (Quad) PF 02/27/2014   ??? Influenza Vaccine (Tri) Adjuvanted 11/12/2016   ??? Influenza Vaccine PF 11/11/2011   ??? Influenza Vaccine Split 11/27/2008, 12/19/2009   ??? Pneumococcal Conjugate (PCV-13) 04/25/2015   ??? Pneumococcal Polysaccharide (PPSV-23) 04/25/2015, 11/27/2016   ??? TB Skin Test (PPD) 03/31/2017       Colonoscopy: Up to date. No  bleeding     Eye exam: Up to date. No vision changes    Mammo: Up to date but due later this year    Dexascan: Up to date    Pelvic/Pap: No bleeding.      Review of Systems   Constitutional: Negative for chills and fever.   HENT: Negative for ear pain and sore throat.    Eyes: Negative for blurred vision and pain.   Respiratory: Positive for shortness of breath. Negative for cough.    Cardiovascular: Positive for chest pain.   Gastrointestinal: Positive for abdominal pain and constipation. Negative for blood in stool and melena.   Genitourinary: Negative for dysuria and hematuria.   Musculoskeletal: Positive for joint pain. Negative for myalgias.   Skin: Negative for rash.   Neurological: Negative for headaches.   Endo/Heme/Allergies: Does not bruise/bleed easily.   Psychiatric/Behavioral: Negative for depression and substance abuse.       Depression Risk Factor Screening:      Patient Health Questionnaire (PHQ-2)   Over the last 2 weeks, how often have you been bothered by any of the following problems?  ?? Little interest or pleasure in doing things?  ?? Not at all. [0]  ?? Feeling down, depressed, or hopeless?   ?? Not at all. [0]    Total Score: 0/6  PHQ-2 Assessment Scoring:   A score of 2 or more requires further screening with the PHQ-9    Alcohol Risk Factor Screening:   Women:   On any occasion during the past 3 months, have you had more than 3 drinks containing alcohol? no    Do you average more than 7 drinks per week? no    Tobacco Use Screening:     Social History     Tobacco Use   Smoking Status Never Smoker   Smokeless Tobacco Never Used       Hearing Loss    Hearing is good.    Activities of Daily Living   Self-care.   Requires assistance with: no ADLs    Fall Risk   no falls within the past year    Abuse Screen   None    Additional Examination Findings:  There were no vitals filed for this visit.   There is no height or weight on file to calculate BMI.     Evaluation of Cognitive Function:  Mood/affect: Euthymic   Appearance: Well-groomed  Family member/caregiver input: She is not accompanied by family member during her visit today.      General:   Well-nourished, well-groomed, pleasant, alert, in no acute distress.     Head:  Normocephalic, atraumatic  Ears:  External ears WNL  Eyes:  Clear sclera  Neuro:   Alert, conversant, appropriate, following commands  Skin:    No rashes noted  Psych:  Affect, mood and judgment appropriate      Dementia Screen:  Three Item Recall: 3/3        LABS   Data Review:   Lab Results   Component Value Date/Time    Sodium 140 10/05/2017 10:40 AM    Potassium 4.1 10/05/2017 10:40 AM    Chloride 104 10/05/2017 10:40 AM    CO2 30 10/05/2017 10:40 AM    Anion gap 6 10/05/2017 10:40 AM    Glucose 89 10/05/2017 10:40 AM    BUN 16 10/05/2017 10:40 AM    Creatinine 0.79 10/05/2017 10:40 AM    BUN/Creatinine ratio 20 10/05/2017 10:40 AM    GFR  est AA >60 10/05/2017 10:40 AM    GFR est non-AA >60 10/05/2017 10:40 AM    Calcium 9.1 10/05/2017 10:40 AM       Lab Results   Component Value Date/Time    WBC 5.2 10/05/2017 10:40 AM    Hemoglobin, POC 11.6 (L) 01/28/2018 02:24 PM    HGB 10.0 (L) 10/05/2017 10:40 AM    Hematocrit, POC 34 (L) 01/28/2018 02:24 PM    HCT 33.2 (L) 10/05/2017 10:40 AM    PLATELET 261 10/05/2017 10:40 AM    MCV 91.5 10/05/2017 10:40 AM       Lab Results   Component Value Date/Time    Hemoglobin A1c 5.8 12/09/2012 10:50 AM       Lab Results   Component Value Date/Time    Cholesterol, total 189 10/05/2017 10:40 AM    HDL Cholesterol 80 (H) 10/05/2017 10:40 AM    LDL, calculated 97 10/05/2017 10:40 AM    VLDL, calculated 12 10/05/2017 10:40 AM    Triglyceride 60 10/05/2017 10:40 AM    CHOL/HDL Ratio 2.4 10/05/2017 10:40 AM       Patient Care Team:  Leo Rod, MD as PCP - General (Family Practice)  Leo Rod, MD as PCP - [facility] Empaneled Provider  Kerrin Champagne, MD (Ophthalmology)  Tona Sensing, MD (Cardiology)    End-of-life planning   Advanced Directive in the case than an injury or illness causes the patient to be unable to make health care decisions was discussed with the patient.     Advice/Referrals/Counselling/Plan:   Education and counseling provided:  Are appropriate based on today's review and evaluation  End-of-Life planning (with patient's consent)  Pneumococcal Vaccine  Influenza Vaccine  Screening Mammography  Screening Pap and pelvic (covered once every 2 years)  Colorectal cancer screening tests  Cardiovascular screening blood test  Bone mass measurement (DEXA)  Screening for glaucoma  Diabetes screening test  Include in education list (weight loss, physical activity, smoking cessation, fall prevention, and nutrition)    ICD-10-CM ICD-9-CM    1. Encounter for screening mammogram for breast cancer Z12.31 V76.12 MAM 3D TOMO W MAMMO BI SCREENING INCL CAD   2. Constipation, unspecified constipation type K59.00 564.00 REFERRAL TO GASTROENTEROLOGY   3. Essential hypertension V76 160.7 METABOLIC PANEL, COMPREHENSIVE      LIPID PANEL      HEMOGLOBIN A1C W/O EAG      TSH AND FREE T4   4. Chronic systolic heart failure (HCC) P71.06 269.48 METABOLIC PANEL, COMPREHENSIVE      CBC WITH AUTOMATED DIFF      LIPID PANEL      MICROALBUMIN, UR, RAND W/ MICROALB/CREAT RATIO      HEMOGLOBIN A1C W/O EAG      TSH AND FREE T4   5. Multinodular goiter N46.2 703.5 METABOLIC PANEL, COMPREHENSIVE      CBC WITH AUTOMATED DIFF   6. OSA (obstructive sleep apnea) G47.33 327.23    7. Mixed hyperlipidemia K09.3 818.2 METABOLIC PANEL, COMPREHENSIVE      LIPID PANEL      MICROALBUMIN, UR, RAND W/ MICROALB/CREAT RATIO   8. Hyperglycemia X93.7 169.67 METABOLIC PANEL, COMPREHENSIVE      HEMOGLOBIN A1C W/O EAG     reviewed diet, exercise and weight control.  Brief written plan, checklist    Assessment/Plan:    Health Maintenance:  ??? Mammogram ordered for later this year.  ???I encouraged her to get all recommended vaccinations.    ORDERS:   -  MAM 3D TOMO W MAMMO BI SCREENING INCL CAD; Future      Lab review: labs are reviewed in the [place] (ACP) Provider Conversation     Date of ACP Conversation: 08/17/18  Persons included in Conversation:  patient    Authorized Decision Maker (if patient is incapable of making informed decisions):   This person is:   Pharmacist, community in [employer], retail          For Patients with Decision Making Capacity:   Values/Goals: Exploration of values, goals, and preferences if recovery is not expected, even with continued medical treatment in the event of:  Imminent death  Severe, permanent brain injury    Conversation Outcomes / Follow-Up Plan:   We reviewed her existing advance directive. She still wants the person listed to be her primary POA but is ready to select a sucessor POA. I will mail her an advance directive to complete.-update.        Due to this being a TeleHealth  evaluation, many elements of the physical examination are unable to be assessed.   ??  The pt was seen by synchronous (real-time) audio-video technology, and/or her healthcare decision maker, is aware that this patient-initiated, Telehealth encounter is a billable service, with coverage as determined by her insurance carrier. She is aware that she may receive a bill and has provided verbal consent to proceed: Yes.   ??  The pt is being evaluated by a video visit encounter for concerns as above. A caregiver was present when appropriate. Due to this being a Scientist, physiological (During GHWEX-93 public health emergency), evaluation of the following organ systems was limited: Vitals/Constitutional/EENT/Resp/CV/GI/GU/MS/Neuro/Skin/Heme-Lymph-Imm.   Pursuant to the emergency declaration under the [place], 1135 waiver authority and the [employer] Act, this Virtual   Visit was conducted, with patient's (and/or legal guardian's) consent, to reduce the patient's risk of exposure to COVID-19 and provide necessary medical care.   ??  Services were provided through a video synchronous discussion virtually to substitute for in-person clinic visit. Patient and provider were located at their individual homes.

## 2018-08-17 NOTE — Patient Instructions (Addendum)
Medicare Wellness Visit, Female     The best way to live healthy is to have a lifestyle where you eat a well-balanced diet, exercise regularly, limit alcohol use, and quit all forms of tobacco/nicotine, if applicable.     Regular preventive services are another way to keep healthy. Preventive services (vaccines, screening tests, monitoring & exams) can help personalize your care plan, which helps you manage your own care. Screening tests can find health problems at the earliest stages, when they are easiest to treat.   [facility] follows the current, evidence-based guidelines published by the [place] [employer] (USPSTF) when recommending preventive services for our patients. Because we follow these guidelines, sometimes recommendations change over time as research supports it. (For example, mammograms used to be recommended annually. Even though Medicare will still pay for an annual mammogram, the newer guidelines recommend a mammogram every two years for women of average risk).  Of course, you and your doctor may decide to screen more often for some diseases, based on your risk and your co-morbidities (chronic disease you are already diagnosed with).     Preventive services for you include:  - Medicare offers their members a free annual wellness visit, which is time for you and your primary care provider to discuss and plan for your preventive service needs. Take advantage of this benefit every year!  -All adults over the age of 48 should receive the recommended pneumonia vaccines. Current USPSTF guidelines recommend a series of two vaccines for the best pneumonia protection.   -All adults should have a flu vaccine yearly and a tetanus vaccine every 10 years.   -All adults age 61 and older should receive the shingles vaccines (series of two vaccines).      -All adults age 42-70 who are overweight should have a diabetes screening test once every three years.    -All adults born between [birth year] and 1965 should be screened once for Hepatitis C.  -Other screening tests and preventive services for persons with diabetes include: an eye exam to screen for diabetic retinopathy, a kidney function test, a foot exam, and stricter control over your cholesterol.   -Cardiovascular screening for adults with routine risk involves an electrocardiogram (ECG) at intervals determined by your doctor.   -Colorectal cancer screenings should be done for adults age 24-75 with no increased risk factors for colorectal cancer.  There are a number of acceptable methods of screening for this type of cancer. Each test has its own benefits and drawbacks. Discuss with your doctor what is most appropriate for you during your annual wellness visit. The different tests include: colonoscopy (considered the best screening method), a fecal occult blood test, a fecal DNA test, and sigmoidoscopy.    -A bone mass density test is recommended when a woman turns 65 to screen for osteoporosis. This test is only recommended one time, as a screening. Some providers will use this same test as a disease monitoring tool if you already have osteoporosis.  -Breast cancer screenings are recommended every other year for women of normal risk, age 90-74.  -Cervical cancer screenings for women over age 23 are only recommended with certain risk factors.     Here is a list of your current Health Maintenance items (your personalized list of preventive services) with a due date:  Health Maintenance Due   Topic Date Due   ??? DTaP/Tdap/Td  (1 - Tdap) 04/12/1971   ??? Shingles Vaccine (1 of 2) 04/11/2000   ???  Annual Well Visit  08/06/2018            Well Visit, Over 20: Care Instructions  Your Care Instructions     Physical exams can help you stay healthy. Your doctor has checked your overall health and may have suggested ways to take good care of yourself. He or she also may have recommended tests. At home, you can help prevent  illness with healthy eating, regular exercise, and other steps.  Follow-up care is a key part of your treatment and safety. Be sure to make and go to all appointments, and call your doctor if you are having problems. It's also a good idea to know your test results and keep a list of the medicines you take.  How can you care for yourself at home?  ?? Reach and stay at a healthy weight. This will lower your risk for many problems, such as obesity, diabetes, heart disease, and high blood pressure.  ?? Get at least 30 minutes of exercise on most days of the week. Walking is a good choice. You also may want to do other activities, such as running, swimming, cycling, or playing tennis or team sports.  ?? Do not smoke. Smoking can make health problems worse. If you need help quitting, talk to your doctor about stop-smoking programs and medicines. These can increase your chances of quitting for good.  ?? Protect your skin from too much sun. When you're outdoors from 10 a.m. to 4 p.m., stay in the shade or cover up with clothing and a hat with a wide brim. Wear sunglasses that block UV rays. Even when it's cloudy, put broad-spectrum sunscreen (SPF 30 or higher) on any exposed skin.  ?? See a dentist one or two times a year for checkups and to have your teeth cleaned.  ?? Wear a seat belt in the car.  Follow your doctor's advice about when to have certain tests. These tests can spot problems early.  For men and women  ?? Cholesterol. Your doctor will tell you how often to have this done based on your overall health and other things that can increase your risk for heart attack and stroke.  ?? Blood pressure. Have your blood pressure checked during a routine doctor visit. Your doctor will tell you how often to check your blood pressure based on your age, your blood pressure results, and other factors.  ?? Diabetes. Ask your doctor whether you should have tests for diabetes.   ?? Vision. Experts recommend that you have yearly exams for glaucoma and other age-related eye problems.  ?? Hearing. Tell your doctor if you notice any change in your hearing. You can have tests to find out how well you hear.  ?? Colon cancer tests. Keep having colon cancer tests as your doctor recommends. You can have one of several types of tests.  ?? Heart attack and stroke risk. At least every 4 to 6 years, you should have your risk for heart attack and stroke assessed. Your doctor uses factors such as your age, blood pressure, cholesterol, and whether you smoke or have diabetes to show what your risk for a heart attack or stroke is over the next 10 years.  ?? Osteoporosis. Talk to your doctor about whether you should have a bone density test to find out whether you have thinning bones. Ask your doctor if you need to take a calcium plus vitamin D supplement. You may be able to get enough calcium and vitamin D through  your diet.  For women  ?? Pap test and pelvic exam. You may no longer need a Pap test. Talk with your doctor about whether to stop or continue to have Pap tests.  ?? Breast exam and mammogram. Ask how often you should have a mammogram, which is an X-ray of your breasts. A mammogram can spot breast cancer before it can be felt and when it is easiest to treat.  ?? Thyroid disease. Talk to your doctor about whether to have your thyroid checked as part of a regular physical exam. Women have an increased chance of a thyroid problem.  For men  ?? Prostate exam. Talk to your doctor about whether you should have a blood test (called a PSA test) for prostate cancer. Experts recommend that you discuss the benefits and risks of the test with your doctor before you decide whether to have this test. Some experts say that men ages 75 and older no longer need testing.  ?? Abdominal aortic aneurysm. Ask your doctor whether you should have a test to check for an aneurysm. You may need a test if you ever smoked or  if your parent, brother, sister, or child has had an aneurysm.  When should you call for help?  Watch closely for changes in your health, and be sure to contact your doctor if you have any problems or symptoms that concern you.  Where can you learn more?  Go to [email or web address]  Enter [phone] in the search box to learn more about "Well Visit, Over 65: Care Instructions."  Current as of: October 15, 2017??????????????????????????????Content Version: 12.5  ?? 2006-2020 [employer].   Care instructions adapted under license by [employer] (which disclaims liability or warranty for this information). If you have questions about a medical condition or this instruction, always ask your healthcare professional. [place] any warranty or liability for your use of this information.    Medicare Part B Preventive Services Limitations Recommendation Scheduled   Bone Mass Measurement  (age 31 & older, biennial) Requires diagnosis related to osteoporosis or estrogen deficiency. Biennial benefit unless patient has history of long-term glucocorticoid tx or baseline is needed because initial test was by other method This should be done at age 74 and again if on osteoporosis medication at 2-3 year intervals.  Up to date   Cardiovascular Screening Blood Tests (every 5 years)  Total cholesterol, HDL, Triglycerides Order as a panel if possible We should check your cholesterol panel at least once every 5 years. Up to date   Colorectal Cancer Screening  -Fecal occult blood test (annual)  -Flexible sigmoidoscopy (5y)  -Screening colonoscopy (10y)  -Barium Enema  Due per your Gastroenterologist's recommendations.  Up to date   Counseling to Prevent Tobacco Use (up to 8 sessions per year)  - Counseling greater than 3 and up to 10 minutes  - Counseling greater than 10 minutes Patients must be asymptomatic of tobacco-related conditions to receive as preventive service Not applicable  Not applicable   Diabetes Screening Tests (at least every 3 years, Medicare covers annually or at 14-month intervals for prediabetic patients)    Fasting blood sugar (FBS) or glucose tolerance test (GTT) Patient must be diagnosed with one of the following:  -Hypertension, Dyslipidemia, obesity, previous impaired FBS or GTT  ???Or any two of the following: overweight, FH of diabetes, age ?26, history of gestational diabetes, birth of baby weighing more than 9 pounds Should be done yearly Up to date   Diabetes  Self-Management Training (DSMT) (no USPSTF recommendation) Requires referral by treating physician for patient with diabetes or renal disease. 10 hours of initial DSMT session of no less than 30 minutes each in a continuous 41-month period.  2 hours of follow-up DSMT in subsequent years. Not applicable Not applicable   Glaucoma Screening (no USPSTF recommendation) Diabetes mellitus, family history, African American, age 52 or over, Hispanic American, age 27 or over Continue with annual eye exams. Up to date   Human Immunodeficiency Virus (HIV) Screening (annually for increased risk patients)  HIV-1 and HIV-2 by EIA, ELISA, rapid antibody test, or oral mucosa transudate Patient must be at increased risk for HIV infection per USPSTF guidelines or pregnant.  Tests covered annually for patients at increased risk.  Pregnant patients may receive up to 3 test during pregnancy. Not applicable Not applicable   Medical Nutrition Therapy (MNT) (for diabetes or renal disease not recommended schedule) Requires referral by treating physician for patient with diabetes or renal disease.  Can be provided in same year as diabetes self-management training (DSMT), and CMS recommends medical nutrition therapy take place after DSMT.  Up to 3 hours for initial year and 2 hours in subsequent years. Not applicable Not applicable   Shingles Vaccination A shingles vaccine is also recommended once in a  lifetime after age 27 Vaccination recommended for shingles vaccination. Overdue   Seasonal Influenza Vaccination (annually)  Continue with yearly "flu" shot annually Due later this year   Pneumococcal Vaccination (once after 65)  Please receive this vaccination at age 42. Up to date   Hepatitis B Vaccinations (if medium/high risk) Medium/high risk factors:  End-stage renal disease,  Hemophiliacs who received Factor VIII or IX concentrates, Clients of institutions for the mentally retarded, Persons who live in the same house as a HepB virus carrier, Homosexual men, Illicit injectable drug abusers. Not applicable Not applicable   Screening Mammography (biennial age 54-74) Annually (age 76 or over) You need a mammogram yearly to screen for breast cancer. Ordered for later this year   Screening Pap Tests and Pelvic Examination (up to age 22 and after 87 if unknown history or abnormal study last 10 years) Every 24 months except high risk You need no Pap smear at this time. Not warranted   Ultrasound Screening for Abdominal Aortic Aneurysm (AAA) (once) Patient must be referred through IPPE and not have had a screening for abdominal aortic aneurysm before under Medicare.  Limited to patients who meet one of the following criteria:  - Men who are 72-70 years old and have smoked more than 100 cigarettes in their lifetime.  -Anyone with a FH of AAA  -Anyone recommended for screening by USPSTF Not applicable Not applicable     Health Maintenance Due   Topic   ??? DTaP/Tdap/Td series (1 - Tdap)   ??? Shingrix Vaccine Age 87> (1 of 2)   ??? Medicare Yearly Exam

## 2018-08-17 NOTE — ACP (Advance Care Planning) (Signed)
Advance Care Planning  Advance Care Planning (ACP) Provider Conversation     Date of ACP Conversation: 08/17/18  Persons included in Conversation:  patient    Authorized Decision Maker (if patient is incapable of making informed decisions):   This person is:   Pharmacist, community in [place], [employer]          For Patients with Decision Making Capacity:   Values/Goals: Exploration of values, goals, and preferences if recovery is not expected, even with continued medical treatment in the event of:  Imminent death  Severe, permanent brain injury    Conversation Outcomes / Follow-Up Plan:   We reviewed her existing advance directive. She still wants the person listed to be her primary POA but is ready to select a sucessor POA. I will mail her an advance directive to complete.-update.    Dr. Marius Ditch  [facility], [place] [address]  Phone: [phone]  Fax: [phone]

## 2018-08-17 NOTE — ACP (Advance Care Planning) (Signed)
Advance Care Planning  Advance Care Planning (ACP) Provider Conversation     Date of ACP Conversation: 08/17/18  Persons included in Conversation:  patient    Authorized Decision Maker (if patient is incapable of making informed decisions):   This person is:   Pharmacist, community in [place], [employer]          For Patients with Decision Making Capacity:   Values/Goals: Exploration of values, goals, and preferences if recovery is not expected, even with continued medical treatment in the event of:  Imminent death  Severe, permanent brain injury    Conversation Outcomes / Follow-Up Plan:   We reviewed her existing advance directive. She still wants the person listed to be her primary POA but is ready to select a sucessor POA. I will mail her an advance directive to complete.-update.    Dr. Marius Ditch  [facility], [place] [address]  Phone: [phone]  Fax: [phone]

## 2018-08-17 NOTE — Progress Notes (Signed)
[facility]:  08/23/18, [id number]      The Subsequent Medicare Annual Wellness Exam PROGRESS NOTE    This is a Subsequent Medicare Annual Wellness Exam (AWV).    I have reviewed the patient's medical history in detail and updated the computerized patient record.     Cathy [name] is a 68 y.o. African American female and presents for an annual wellness exam.    SUBJECTIVE    Past Medical History:   Diagnosis Date   ??? Age related osteoporosis    ??? Automatic implantable cardiac defibrillator in situ     Post ddd icd Set up [facility]   ??? Baker cyst, left 12/2016   ??? Breast mass, left     benign   ??? CHF (congestive heart failure) ([place]) 06/23/2008    Non-ischemic CMP , Cath (2008) Normal coronary arteries   ??? Chronic systolic heart failure (HCC)     Stable,    ??? Degenerative arthritis of left knee    ??? Fibroid 06/23/2008   ??? History of brain tumor 2013   ??? HLD (hyperlipidemia)    ??? HTN (hypertension) 06/23/2008   ??? Hypotension, unspecified     Related to lisinopril   ??? 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 Left knee pain     With swelling   ??? Leg pain    ??? LVAD (left ventricular assist device) present ([place]) 03/2017   ??? 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 OSA (obstructive sleep apnea) 9/10   ??? Venous reflux    ??? Wears glasses       Past Surgical History:   Procedure Laterality Date   ??? FULL ESOPHAGEAL MANOMETRY  02/16/2017        ??? HX BREAST BIOPSY      rt breast, benign   ??? HX CHOLECYSTECTOMY     ??? HX CRANIOTOMY  2013    meningioma    ??? HX HEENT      glasses   ??? HX HYSTERECTOMY     ??? HX IMPLANTABLE CARDIOVERTER DEFIBRILLATOR     ??? HX PACEMAKER  2013    dual chamber icd   ??? HX TONSIL AND ADENOIDECTOMY     ??? HX TOTAL ABDOMINAL HYSTERECTOMY  1996   ??? HX TUBAL LIGATION       Current Outpatient Medications   Medication Sig Dispense Refill   ??? spironolactone (ALDACTONE) 25 mg tablet TAKE ONE TABLET BY MOUTH DAILY 90 Tab 2   ??? ergocalciferol  (ERGOCALCIFEROL) 50,000 unit capsule Take 50,000 Units by mouth every seven (7) days.     ??? aspirin (ASPIRIN) 325 mg tablet Take 325 mg by mouth daily.     ??? warfarin (COUMADIN) 1 mg tablet 3mg  daily     ??? bisacodyl (DULCOLAX) 5 mg EC tablet Take 5 mg by mouth daily as needed.     ??? polyethylene glycol (MIRALAX) 17 gram packet Take 17 g by mouth daily as needed.     ??? atorvastatin (LIPITOR) 40 mg tablet Take 40 mg by mouth daily.     ??? calcium carbonate (OS-CAL) 500 mg calcium (1,250 mg) tablet Take 1,250 mg by mouth daily.       Allergies   Allergen Reactions   ??? Penicillamine Itching   ??? Penicillins Rash and Itching     Itching bumps on hands and arms  Itching bumps on hands  and arms  Itching bumps on hands and arms  Has patient had a PCN reaction causing immediate rash, facial/tongue/throat swelling, SOB or lightheadedness with hypotension: Yes  Has patient had a PCN reaction causing severe rash involving mucus membranes or skin necrosis: No  Has patient had a PCN reaction that required hospitalization unknown  Has patient had a PCN reaction occurring within the last 10 years: No  If all of the above answers are "NO", then may proceed with Cephalosporin use.    Itching bumps on hands and arms   ??? Tramadol Other (comments)     Psychotic Hallucinations  Psychotic Hallucinations  hallucinations  Psychotic Hallucinations     Family History   Problem Relation Age of Onset   ??? Diabetes Mother    ??? Heart Attack Father         MI   ??? Heart Disease Maternal Grandmother    ??? Heart Disease Paternal Grandmother    ??? Kidney Disease Other         1 sib deceased   ??? Cancer Other         1 sib throat cancer   ??? Diabetes Other    ??? Diabetes Daughter    ??? Other Daughter         leukemia   ??? Hypertension Other      Social History     Tobacco Use   ??? Smoking status: Never Smoker   ??? Smokeless tobacco: Never Used   Substance Use Topics   ??? Alcohol use: No     Patient Active Problem List   Diagnosis Code   ??? Fibroid D21.9   ??? HTN  (hypertension) I10   ??? Gallstones K80.20   ??? Insomnia G47.00   ??? OSA (obstructive sleep apnea) G47.33   ??? Cataract H26.9   ??? AICD (automatic cardioverter/defibrillator) present Z95.810   ??? Chronic systolic heart failure (HCC) I50.22   ??? Automatic implantable cardioverter-defibrillator in situ Z95.810   ??? Mixed hyperlipidemia E78.2   ??? AR (allergic rhinitis) J30.9   ??? Non-rheumatic mitral regurgitation I34.0   ??? Age-related osteoporosis without current pathological fracture M81.0   ??? Multinodular goiter E04.2   ??? History of meningioma Z86.018   ??? 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    Health Maintenance History  Immunizations reviewed:   Tdap over-due per our records.    Pneumovax: up to date   Flu: due later this year  Zoster: over-due    Immunization History   Administered Date(s) Administered   ??? Hep B Vaccine (Adult) 11/12/2017   ??? Hep B, Adol/Ped 05/14/2017, 06/10/2017   ??? Hib (HbOC) 09/23/2017   ??? Influenza High Dose Vaccine PF 02/06/2016   ??? Influenza Vaccine 11/27/2008, 12/19/2009, 11/11/2011, 01/27/2013, 01/27/2013, 02/27/2014, 02/06/2016, 11/12/2016, 11/12/2017, 12/08/2017   ??? Influenza Vaccine (Quad) PF 02/27/2014   ??? Influenza Vaccine (Tri) Adjuvanted 11/12/2016   ??? Influenza Vaccine PF 11/11/2011   ??? Influenza Vaccine Split 11/27/2008, 12/19/2009   ??? Pneumococcal Conjugate (PCV-13) 04/25/2015   ??? Pneumococcal Polysaccharide (PPSV-23) 04/25/2015, 11/27/2016   ??? TB Skin Test (PPD) 03/31/2017       Colonoscopy: Up to date. No  bleeding    Eye exam: Up to date. No  vision changes    Mammo: Up to date but due later this year    Dexascan: Up to date    Pelvic/Pap: No bleeding.      Review of Systems   Constitutional: Negative for chills and fever.   HENT: Negative for ear pain and sore throat.    Eyes: Negative for blurred vision and pain.   Respiratory: Positive for shortness of breath. Negative for cough.    Cardiovascular: Positive for chest pain.   Gastrointestinal: Positive for abdominal pain and constipation. Negative for blood in stool and melena.   Genitourinary: Negative for dysuria and hematuria.   Musculoskeletal: Positive for joint pain. Negative for myalgias.   Skin: Negative for rash.   Neurological: Negative for headaches.   Endo/Heme/Allergies: Does not bruise/bleed easily.   Psychiatric/Behavioral: Negative for depression and substance abuse.       Depression Risk Factor Screening:      Patient Health Questionnaire (PHQ-2)   Over the last 2 weeks, how often have you been bothered by any of the following problems?  ?? Little interest or pleasure in doing things?  ?? Not at all. [0]  ?? Feeling down, depressed, or hopeless?   ?? Not at all. [0]    Total Score: 0/6  PHQ-2 Assessment Scoring:   A score of 2 or more requires further screening with the PHQ-9    Alcohol Risk Factor Screening:   Women:   On any occasion during the past 3 months, have you had more than 3 drinks containing alcohol? no    Do you average more than 7 drinks per week? no    Tobacco Use Screening:     Social History     Tobacco Use   Smoking Status Never Smoker   Smokeless Tobacco Never Used       Hearing Loss    Hearing is good.    Activities of Daily Living   Self-care.   Requires assistance with: no ADLs    Fall Risk   no falls within the past year    Abuse Screen   None    Additional Examination Findings:  There were no vitals filed for this visit.   There is no height or weight on file to calculate BMI.     Evaluation of Cognitive Function:  Mood/affect: Euthymic  Appearance:  Well-groomed  Family member/caregiver input: She is not accompanied by family member during her visit today.      General:   Well-nourished, well-groomed, pleasant, alert, in no acute distress.     Head:  Normocephalic, atraumatic  Ears:  External ears WNL  Eyes:  Clear sclera  Neuro:   Alert, conversant, appropriate, following commands  Skin:    No rashes noted  Psych:  Affect, mood and judgment appropriate      Dementia Screen:  Three Item Recall: 3/3        LABS   Data Review:   Lab Results   Component Value Date/Time    Sodium 140 10/05/2017 10:40 AM    Potassium 4.1 10/05/2017 10:40 AM    Chloride 104 10/05/2017 10:40 AM    CO2 30 10/05/2017 10:40 AM    Anion gap 6 10/05/2017 10:40 AM    Glucose 89 10/05/2017 10:40 AM    BUN 16 10/05/2017 10:40 AM    Creatinine 0.79 10/05/2017 10:40 AM    BUN/Creatinine ratio 20 10/05/2017 10:40 AM    GFR  est AA >60 10/05/2017 10:40 AM    GFR est non-AA >60 10/05/2017 10:40 AM    Calcium 9.1 10/05/2017 10:40 AM       Lab Results   Component Value Date/Time    WBC 5.2 10/05/2017 10:40 AM    Hemoglobin, POC 11.6 (L) 01/28/2018 02:24 PM    HGB 10.0 (L) 10/05/2017 10:40 AM    Hematocrit, POC 34 (L) 01/28/2018 02:24 PM    HCT 33.2 (L) 10/05/2017 10:40 AM    PLATELET 261 10/05/2017 10:40 AM    MCV 91.5 10/05/2017 10:40 AM       Lab Results   Component Value Date/Time    Hemoglobin A1c 5.8 12/09/2012 10:50 AM       Lab Results   Component Value Date/Time    Cholesterol, total 189 10/05/2017 10:40 AM    HDL Cholesterol 80 (H) 10/05/2017 10:40 AM    LDL, calculated 97 10/05/2017 10:40 AM    VLDL, calculated 12 10/05/2017 10:40 AM    Triglyceride 60 10/05/2017 10:40 AM    CHOL/HDL Ratio 2.4 10/05/2017 10:40 AM       Patient Care Team:  Leo Rod, MD as PCP - General (Family Practice)  Leo Rod, MD as PCP - [facility] Empaneled Provider  Kerrin Champagne, MD (Ophthalmology)  Tona Sensing, MD (Cardiology)    End-of-life planning  Advanced Directive in the case than an injury or  illness causes the patient to be unable to make health care decisions was discussed with the patient.     Advice/Referrals/Counselling/Plan:   Education and counseling provided:  Are appropriate based on today's review and evaluation  End-of-Life planning (with patient's consent)  Pneumococcal Vaccine  Influenza Vaccine  Screening Mammography  Screening Pap and pelvic (covered once every 2 years)  Colorectal cancer screening tests  Cardiovascular screening blood test  Bone mass measurement (DEXA)  Screening for glaucoma  Diabetes screening test  Include in education list (weight loss, physical activity, smoking cessation, fall prevention, and nutrition)    ICD-10-CM ICD-9-CM    1. Encounter for screening mammogram for breast cancer Z12.31 V76.12 MAM 3D TOMO W MAMMO BI SCREENING INCL CAD   2. Constipation, unspecified constipation type K59.00 564.00 REFERRAL TO GASTROENTEROLOGY   3. Essential hypertension R51 884.1 METABOLIC PANEL, COMPREHENSIVE      LIPID PANEL      HEMOGLOBIN A1C W/O EAG      TSH AND FREE T4   4. Chronic systolic heart failure (HCC) Y60.63 016.01 METABOLIC PANEL, COMPREHENSIVE      CBC WITH AUTOMATED DIFF      LIPID PANEL      MICROALBUMIN, UR, RAND W/ MICROALB/CREAT RATIO      HEMOGLOBIN A1C W/O EAG      TSH AND FREE T4   5. Multinodular goiter U93.2 355.7 METABOLIC PANEL, COMPREHENSIVE      CBC WITH AUTOMATED DIFF   6. OSA (obstructive sleep apnea) G47.33 327.23    7. Mixed hyperlipidemia D22.0 254.2 METABOLIC PANEL, COMPREHENSIVE      LIPID PANEL      MICROALBUMIN, UR, RAND W/ MICROALB/CREAT RATIO   8. Hyperglycemia H06.2 376.28 METABOLIC PANEL, COMPREHENSIVE      HEMOGLOBIN A1C W/O EAG     reviewed diet, exercise and weight control.  Brief written plan, checklist    Assessment/Plan:    Health Maintenance:  ??? Mammogram ordered for later this year.  ???I encouraged her to get all recommended vaccinations.    ORDERS:  -  MAM 3D TOMO W MAMMO BI SCREENING INCL CAD; Future      Lab review: labs are  reviewed in the [place] (ACP) Provider Conversation     Date of ACP Conversation: 08/17/18  Persons included in Conversation:  patient    Authorized Decision Maker (if patient is incapable of making informed decisions):   This person is:   Pharmacist, community in [employer], retail          For Patients with Decision Making Capacity:   Values/Goals: Exploration of values, goals, and preferences if recovery is not expected, even with continued medical treatment in the event of:  Imminent death  Severe, permanent brain injury    Conversation Outcomes / Follow-Up Plan:   We reviewed her existing advance directive. She still wants the person listed to be her primary POA but is ready to select a sucessor POA. I will mail her an advance directive to complete.-update.        Due to this being a TeleHealth  evaluation, many elements of the physical examination are unable to be assessed.   ??  The pt was seen by synchronous (real-time) audio-video technology, and/or her healthcare decision maker, is aware that this patient-initiated, Telehealth encounter is a billable service, with coverage as determined by her insurance carrier. She is aware that she may receive a bill and has provided verbal consent to proceed: Yes.   ??  The pt is being evaluated by a video visit encounter for concerns as above. A caregiver was present when appropriate. Due to this being a Scientist, physiological (During IZTIW-58 public health emergency), evaluation of the following organ systems was limited: Vitals/Constitutional/EENT/Resp/CV/GI/GU/MS/Neuro/Skin/Heme-Lymph-Imm.   Pursuant to the emergency declaration under the [place], 1135 waiver authority and the [employer] Act, this Virtual  Visit was conducted, with patient's (and/or legal guardian's) consent, to reduce the patient's risk of exposure to  COVID-19 and provide necessary medical care.   ??  Services were provided through a video synchronous discussion virtually to substitute for in-person clinic visit. Patient and provider were located at their individual homes.

## 2018-08-17 NOTE — Progress Notes (Signed)
Cathy [name] is a 67 y.o. female who was seen by synchronous (real-time) audio-video technology on 08/17/2018.        Assessment & Plan:   1. CHF/HTN: Stable. +LVAD  ???Continue to follow-up with Cardiology  ???Continue with Rx per their recommendations.  ???I will check a CMP, urine protein screen, CBC, lipid panel, and A1c just before her follow-up appointment.    2. Constipation: Her diet is limited 2/2 being on chronic anticoagulation with Coumadin.  ???I encouraged her to use Metamucil and drink plenty of water each day.  ???Referral to GI for additional recommendations.    ORDERS:  - REFERRAL TO GASTROENTEROLOGY    3.  Hyperlipidemia: Stable.  ???Continue with Rx as prescribed.  ???I will check labs as mentioned above in 3 months.    4.  Multinodular Goiter History:  ???I encouraged her to follow-up with her specialist.  Meanwhile, I will check a CBC and TFTs in 3 months.      Lab review: labs are reviewed in the EHR and ordered as mentioned above     I have discussed the diagnosis with the patient and the intended plan as seen in the above orders. I have discussed medication side effects and warnings with the patient as well. I have reviewed the plan of care with the patient, accepted their input and they are in agreement with the treatment goals. All questions were answered. The patient understands the plan of care. Pt instructed if symptoms worsen to call the office or report to the ED for continued care.  Greater than 50% of the visit time was spent in counseling and/or coordination of care.     Voice recognition was used to generate this report, which may have resulted in some phonetic based errors in grammar and contents. Even though attempts were made to correct all the mistakes, some may have been missed, and remained in the body of the document.      Subjective:   Cathy [name] was seen for   Chief Complaint   Patient presents with   ??? Follow-up     The patient is a 68 year old female with  history of hypertension, chronic systolic heart failure??(followed by Dr.McCray) with EF of 10%, mitral regurgitation, cholelithiasis, multinodular goiter, obstructive sleep apnea??(not on rx), allergic rhinitis, osteoporosis, uterine fibroids per EHR, insomnia, cataracts,??dysphagia from achalasia?,??LVAD placement in 2019,??AICD placement,??left Baker's cyst,??hyperlipidemia, and meningioma status post resection.    1.  CHF/Hypertension: +LVAD, followed by Cardiology. On aspirin, Bumex, Coumadin, and Aldactone. Bleeding: she has bright red blood per rectum - see #4.  SOB/CP: unchanged and chronic. LVAD issues: none. She was placed on gabapentin for neuropathic pain along her drive line (300mg  tid prescribed but she only takes it prn).    2. HLD: Taking Lipitor. Diet: limited by taking coumadin.  Her blood glucose has been elevated on previous BMPs regularly.    3. Multinodular Goiter: Followed by ENT.  Her last ultrasound was done in January 2019.  Findings are listed below. She was told not to f/u with them. A f/u ultrasound was not recommended. No dysphagia.   ????  03/25/17 Thyroid Ultrasound: There are 2 left-sided thyroid nodules.  The solid nodule is similar or minimally increased in size from 1 year prior. Recommend continued sonographic follow-up as this corresponds to the nodule which was sampled with FNA which demonstrated no malignancy.  The additional left-sided nodule is minimally increased in size, but the increase in size appears predominantly within the  cystic component. Attention on follow-up is also advised for this nodule in one year.  ??  4. Constipation: She takes an OTC laxative in order to have a BM - and has been doing this for the past 1-2 months. She has straining and BRBPR.  Her FIT test was unremarkable last year. She drinks plenty of water on a daily basis. Diet: she is not consuming a lot of fiber in her diet. She has nausea but no vomiting. She has some abdominal cramping - lower abdomen -  off/on but sx improve with having a BM.     Prior to Admission medications    Medication Sig Start Date End Date Taking? Authorizing Provider   spironolactone (ALDACTONE) 25 mg tablet TAKE ONE TABLET BY MOUTH DAILY 05/09/18   Leo Rod, MD   ergocalciferol (ERGOCALCIFEROL) 50,000 unit capsule Take 50,000 Units by mouth every seven (7) days. 09/25/17   Provider, Historical   aspirin (ASPIRIN) 325 mg tablet Take 325 mg by mouth daily. 04/30/17   Provider, Historical   warfarin (COUMADIN) 1 mg tablet 3mg  daily 05/11/17   Provider, Historical   bisacodyl (DULCOLAX) 5 mg EC tablet Take 5 mg by mouth daily as needed. 05/01/17   Provider, Historical   polyethylene glycol (MIRALAX) 17 gram packet Take 17 g by mouth daily as needed. 05/01/17   Provider, Historical   atorvastatin (LIPITOR) 40 mg tablet Take 40 mg by mouth daily. 04/30/17   Provider, Historical   calcium carbonate (OS-CAL) 500 mg calcium (1,250 mg) tablet Take 1,250 mg by mouth daily. 05/01/17   Provider, Historical     Allergies   Allergen Reactions   ??? Penicillamine Itching   ??? Penicillins Rash and Itching     Itching bumps on hands and arms  Itching bumps on hands and arms  Itching bumps on hands and arms  Has patient had a PCN reaction causing immediate rash, facial/tongue/throat swelling, SOB or lightheadedness with hypotension: Yes  Has patient had a PCN reaction causing severe rash involving mucus membranes or skin necrosis: No  Has patient had a PCN reaction that required hospitalization unknown  Has patient had a PCN reaction occurring within the last 10 years: No  If all of the above answers are "NO", then may proceed with Cephalosporin use.    Itching bumps on hands and arms   ??? Tramadol Other (comments)     Psychotic Hallucinations  Psychotic Hallucinations  hallucinations  Psychotic Hallucinations     Past Medical History:   Diagnosis Date   ??? Age related osteoporosis    ??? Automatic implantable cardiac defibrillator in situ     Post ddd icd Set up  carelink   ??? Baker cyst, left 12/2016   ??? Breast mass, left     benign   ??? CHF (congestive heart failure) ([place]) 06/23/2008    Non-ischemic CMP , Cath (2008) Normal coronary arteries   ??? Chronic systolic heart failure ([facility])     Stable,    ??? Degenerative arthritis of left knee    ??? Fibroid 06/23/2008   ??? History of brain tumor 2013   ??? HLD (hyperlipidemia)    ??? HTN (hypertension) 06/23/2008   ??? Hypotension, unspecified     Related to lisinopril   ??? 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 Left knee pain     With swelling   ??? Leg pain    ??? LVAD (left ventricular assist device)  present ([place]) 03/2017   ??? 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 OSA (obstructive sleep apnea) 9/10   ??? Venous reflux    ??? Wears glasses      Past Surgical History:   Procedure Laterality Date   ??? FULL ESOPHAGEAL MANOMETRY  02/16/2017        ??? HX BREAST BIOPSY      rt breast, benign   ??? HX CHOLECYSTECTOMY     ??? HX CRANIOTOMY  2013    meningioma    ??? HX HEENT      glasses   ??? HX HYSTERECTOMY     ??? HX IMPLANTABLE CARDIOVERTER DEFIBRILLATOR     ??? HX PACEMAKER  2013    dual chamber icd   ??? HX TONSIL AND ADENOIDECTOMY     ??? HX TOTAL ABDOMINAL HYSTERECTOMY  1996   ??? HX TUBAL LIGATION       Family History   Problem Relation Age of Onset   ??? Diabetes Mother    ??? Heart Attack Father         MI   ??? Heart Disease Maternal Grandmother    ??? Heart Disease Paternal Grandmother    ??? Kidney Disease Other         1 sib deceased   ??? Cancer Other         1 sib throat cancer   ??? Diabetes Other    ??? Diabetes Daughter    ??? Other Daughter         leukemia   ??? Hypertension Other      Social History     Socioeconomic History   ??? Marital status: MARRIED     Spouse name: Not on file   ??? Number of children: Not on file   ??? Years of education: Not on file   ??? Highest education level: Not on file   Occupational History   ??? Occupation: home health aide   Tobacco Use   ??? Smoking status: Never Smoker   ??? Smokeless  tobacco: Never Used   Substance and Sexual Activity   ??? Alcohol use: No   ??? Drug use: No   ??? Sexual activity: Yes     Partners: Male     Birth control/protection: Condom   Other Topics Concern       ROS:  Gen: No fever/chills  HEENT: No sore throat, eye pain, ear pain, or congestion. No HA  CV: + CP - chronic and unchanged.  Resp: No cough.+SOB  GI: + abdominal pain  GU: No hematuria/dysuria  Derm: No rash  Neuro: No new paresthesias/weakness  Musc: No new myalgias/jt pain  Psych: No depression sx      Objective:     General: alert, cooperative, no distress   Mental  status: mental status: alert, oriented to person, place, and time, normal mood, behavior, speech, dress, motor activity, and thought processes   Resp: resp: normal effort and no respiratory distress   Neuro: neuro: no gross deficits   Skin: skin: no discoloration or lesions of concern on visible areas     LABS:  Lab Results   Component Value Date/Time    Sodium 140 10/05/2017 10:40 AM    Potassium 4.1 10/05/2017 10:40 AM    Chloride 104 10/05/2017 10:40 AM    CO2 30 10/05/2017 10:40 AM    Anion gap 6 10/05/2017 10:40 AM    Glucose 89 10/05/2017 10:40 AM  BUN 16 10/05/2017 10:40 AM    Creatinine 0.79 10/05/2017 10:40 AM    BUN/Creatinine ratio 20 10/05/2017 10:40 AM    GFR est AA >60 10/05/2017 10:40 AM    GFR est non-AA >60 10/05/2017 10:40 AM    Calcium 9.1 10/05/2017 10:40 AM       Lab Results   Component Value Date/Time    Cholesterol, total 189 10/05/2017 10:40 AM    HDL Cholesterol 80 (H) 10/05/2017 10:40 AM    LDL, calculated 97 10/05/2017 10:40 AM    VLDL, calculated 12 10/05/2017 10:40 AM    Triglyceride 60 10/05/2017 10:40 AM    CHOL/HDL Ratio 2.4 10/05/2017 10:40 AM       Lab Results   Component Value Date/Time    WBC 5.2 10/05/2017 10:40 AM    Hemoglobin, POC 11.6 (L) 01/28/2018 02:24 PM    HGB 10.0 (L) 10/05/2017 10:40 AM    Hematocrit, POC 34 (L) 01/28/2018 02:24 PM    HCT 33.2 (L) 10/05/2017 10:40 AM    PLATELET 261 10/05/2017 10:40 AM     MCV 91.5 10/05/2017 10:40 AM       Lab Results   Component Value Date/Time    Hemoglobin A1c 5.8 12/09/2012 10:50 AM       Lab Results   Component Value Date/Time    TSH 0.79 11/24/2017 02:40 PM           Due to this being a TeleHealth  evaluation, many elements of the physical examination are unable to be assessed.     The pt was seen by synchronous (real-time) audio-video technology, and/or her healthcare decision maker, is aware that this patient-initiated, Telehealth encounter is a billable service, with coverage as determined by her insurance carrier. She is aware that she may receive a bill and has provided verbal consent to proceed: Yes.     The pt is being evaluated by a video visit encounter for concerns as above. A caregiver was present when appropriate. Due to this being a Scientist, physiological (During OZHYQ-65 public health emergency), evaluation of the following organ systems was limited: Vitals/Constitutional/EENT/Resp/CV/GI/GU/MS/Neuro/Skin/Heme-Lymph-Imm.   Pursuant to the emergency declaration under the [place], 1135 waiver authority and the [employer] Act, this Virtual  Visit was conducted, with patient's (and/or legal guardian's) consent, to reduce the patient's risk of exposure to COVID-19 and provide necessary medical care.     Services were provided through a video synchronous discussion virtually to substitute for in-person clinic visit. Patient and provider were located at their individual homes.      We discussed the expected course, resolution and complications of the diagnosis(es) in detail.  Medication risks, benefits, costs, interactions, and alternatives were discussed as indicated.  I advised her to contact the office if her condition worsens, changes or fails to improve as anticipated. She expressed understanding with the diagnosis(es) and plan.     Leo Rod, MD

## 2018-08-17 NOTE — Progress Notes (Signed)
Ok to give prolia without checking her blood pressure since she has an LVAD.    Dr. Marius Ditch  [facility], [place] [address]  Phone: [phone]  Fax: [phone]

## 2018-08-19 ENCOUNTER — Inpatient Hospital Stay: Admit: 2018-08-19 | Payer: MEDICARE | Primary: Family Medicine

## 2018-08-19 DIAGNOSIS — M810 Age-related osteoporosis without current pathological fracture: Secondary | ICD-10-CM

## 2018-08-19 LAB — PHOSPHORUS
Phosphorus: 2.3 MG/DL — ABNORMAL LOW (ref 2.5–4.9)
Phosphorus: 2.3 MG/DL — ABNORMAL LOW (ref 2.5–4.9)

## 2018-08-19 MED ORDER — DENOSUMAB 60 MG/ML SUB-Q SYRINGE
60 mg/mL | Freq: Once | SUBCUTANEOUS | Status: AC
Start: 2018-08-19 — End: 2018-08-19
  Administered 2018-08-19: 19:00:00 via SUBCUTANEOUS

## 2018-08-19 MED FILL — PROLIA 60 MG/ML SUBCUTANEOUS SYRINGE: 60 mg/mL | SUBCUTANEOUS | Qty: 1

## 2018-08-19 NOTE — Progress Notes (Signed)
[facility] OPIC Progress Note    Date: August 19, 2018    Name: Brielynn Sekula    MRN: [id number]         DOB: 03/07/1950      Ms. Offord arrived in the [facility] today at 1410, in stable condition, here for Q 6 Month, Prolia Injection. She was assessed and education was provided.     Ms. Horseman's vitals were reviewed.  Multiple attempts in both arms and both legs were made, to try and obtain a blood pressure and pulse.       However, Ms. Gathright has a LVAD Device in place, and therefore, No Blood Pressure or Pulse readings could be obtained.--(per patient, a "special device" has to be used, to obtain blood pressure and pulse readings.)      Her referring physician, PCP Dr. Azzie Almas was called, and a verbal order was received from Dr. Cornelia Copa, to proceed with treatment today, and all treatments in our OPIC in the future, without blood pressure or pulse readings, as long as the LVAD device is in place. ----Dr. Cornelia Copa stated that she was providing only virtual visits from home at this time, and therefore  would be unable to actually fax an order to the [facility], but, she was able to attach a note to her most recent virtual visit progress notes from 08-17-18, allowing [place] to proceed with the Prolia Injection today, without blood pressure or pulse.    Visit Vitals  Temp 97.8 ??F (36.6 ??C)   Breastfeeding No       Lab results were reviewed. (Her most recent CMP & Magnesium results were reviewed in [name], from 08-05-18, and were noted to be satisfactory for the Prolia Injection today.)  (The Calcium Level was normal at 9.7 & Magnesium Level was normal at 1.9.)   However, no Phosphorus level was obtainable, so blood for the ordered Phosphorus was drawn from her right [facility] at 1455, and was sent out by courier, to the main hospital, for processing.            COMPREHENSIVE METABOLIC PANEL (31/54/0086 2:46 PM EDT)  COMPREHENSIVE METABOLIC PANEL (76/19/5093 2:46 PM EDT)    Component Value Ref Range Performed At Pathologist Signature   Potassium 3.7 3.5 - 5.5 mmol/L [facility] ??   Sodium 141 133 - 145 mmol/L [facility] ??   Chloride 99 98 - 110 mmol/L [facility] ??   Glucose 99 70 - 99 mg/dL [facility] ??   Calcium 9.7 8.4 - 10.5 mg/dL [facility] ??   Albumin 4.8 3.5 - 5.0 g/dL [facility] ??   SGPT (ALT) 25 5 - 40 U/L [facility] ??   SGOT (AST) 20 10 - 37 U/L [facility] ??   Bilirubin Total 0.4 0.2 - 1.2 mg/dL [facility] ??   Alkaline Phosphatase 121 (H) 40 - 120 U/L [facility] ??   BUN 18 6 - 22 mg/dL [facility] ??   CO2 29 20 - 32 mmol/L [facility] ??   Creatinine 0.7 (L) 0.8 - 1.4 mg/dL [facility] ??   eGFR African American >60.0 >60.0 [facility] ??   eGFR Non African American >60.0 >60.0 [facility] ??   Globulin 2.3 2.0 - 4.0 g/dL [facility] ??   A/G Ratio 2.1 1.1 - 2.6 ratio [facility] ??   Total Protein 7.1 6.2 -  8.1 g/dL [facility] ??   Anion Gap 12.8 mmol/L [facility] ??     COMPREHENSIVE METABOLIC PANEL (71/24/5809 2:46 PM EDT)   Specimen   Blood - Blood (substance)     COMPREHENSIVE METABOLIC PANEL (98/33/8250 2:46 PM EDT)   Narrative Performed At   Estimated GFR results are reported in mL/min/1.73 sq.m. by the MDRD equation.??   ??   This eGFR is validated for stable chronic renal failure patients. This equation is unreliable in acute illness or patients with normal renal function.??   ??   ??   ??  [facility]             Prolia (denosumab) 60 mg, was administered SQ, in the back of her left arm at 1510, per order, and without incident.     .     Ms. Fauble tolerated well, and had no complaints.    Ms. Ellinger was discharged from [facility] in stable condition at 1515. She is to return in 6 months, on Tuesday, 02-22-19, at  1430, for her next appointment, for her next Prolia Injection.     Alinda Money, RN  August 19, 2018  3:10 PM

## 2018-08-19 NOTE — Progress Notes (Signed)
[facility] OPIC Progress Note    Date: August 19, 2018    Name: Cathy [name]    MRN: [id number]         DOB: 07-06-1950      Cathy [name] arrived in the [facility] today at 1410, in stable condition, here for Q 6 Month, Prolia Injection. She was assessed and education was provided.     Cathy [name]'s vitals were reviewed.  Multiple attempts in both arms and both legs were made, to try and obtain a blood pressure and pulse.       However, Cathy [name] has a LVAD Device in place, and therefore, No Blood Pressure or Pulse readings could be obtained.--(per patient, a "special device" has to be used, to obtain blood pressure and pulse readings.)      Her referring physician, PCP Dr. Vergie Living was called, and a verbal order was received from Dr. Marlene Bast, to proceed with treatment today, and all treatments in our OPIC in the future, without blood pressure or pulse readings, as long as the LVAD device is in place. ----Dr. Marlene Bast stated that she was providing only virtual visits from home at this time, and therefore  would be unable to actually fax an order to the [facility], but, she was able to attach a note to her most recent virtual visit progress notes from 08-17-18, allowing [place] to proceed with the Prolia Injection today, without blood pressure or pulse.    Visit Vitals  Temp 97.8 F (36.6 C)   Breastfeeding No       Lab results were reviewed. (Her most recent CMP & Magnesium results were reviewed in [facility], from 08-05-18, and were noted to be satisfactory for the Prolia Injection today.)  (The Calcium Level was normal at 9.7 & Magnesium Level was normal at 1.9.)   However, no Phosphorus level was obtainable, so blood for the ordered Phosphorus was drawn from her right [facility] at 1455, and was sent out by courier, to the main hospital, for processing.            COMPREHENSIVE METABOLIC PANEL (08/05/2018 2:46 PM EDT)  COMPREHENSIVE METABOLIC PANEL (08/05/2018 2:46 PM EDT)   Component Value Ref Range Performed At  Pathologist Signature   Potassium 3.7 3.5 - 5.5 mmol/L [facility]    Sodium 141 133 - 145 mmol/L [facility]    Chloride 99 98 - 110 mmol/L [facility]    Glucose 99 70 - 99 mg/dL [facility]    Calcium 9.7 8.4 - 10.5 mg/dL [facility]    Albumin 4.8 3.5 - 5.0 g/dL [facility]    SGPT (ALT) 25 5 - 40 U/L [facility]    SGOT (AST) 20 10 - 37 U/L [facility]    Bilirubin Total 0.4 0.2 - 1.2 mg/dL [facility]    Alkaline Phosphatase 121 (H) 40 - 120 U/L [facility]    BUN 18 6 - 22 mg/dL [facility]    CO2 29 20 - 32 mmol/L [facility]    Creatinine 0.7 (L) 0.8 - 1.4 mg/dL [facility]    eGFR African American >60.0 >60.0 [facility]    eGFR Non African American >60.0 >60.0 [facility]    Globulin 2.3 2.0 - 4.0 g/dL [facility]    A/G Ratio 2.1 1.1 - 2.6 ratio [facility]    Total Protein 7.1 6.2 -  8.1 g/dL [facility]    Anion Gap 12.8 mmol/L [facility]      COMPREHENSIVE METABOLIC PANEL (08/05/2018 2:46 PM EDT)   Specimen   Blood - Blood (substance)     COMPREHENSIVE METABOLIC PANEL (08/05/2018 2:46 PM EDT)   Narrative Performed At   Estimated GFR results are reported in mL/min/1.73 sq.m. by the MDRD equation.      This eGFR is validated for stable chronic renal failure patients. This equation is unreliable in acute illness or patients with normal renal function.           [facility]             Prolia (denosumab) 60 mg, was administered SQ, in the back of her left arm at 1510, per order, and without incident.     .     Cathy [name] tolerated well, and had no complaints.    [name]15. She is to return in 6 months, on Tuesday, 02-22-19, at 1430, for her next appointment, for her next Prolia Injection.     Dayton Martes, RN  August 19, 2018  3:10 PM

## 2018-08-31 NOTE — Telephone Encounter (Signed)
-----   Message from Leo Rod, MD sent at 08/23/2018  5:32 PM EDT -----  Regarding: F/u apts needed  Please schedule patient for a follow-up appointment with me (30 minutes) in October.  Please schedule her for labs 1 week before her follow-up appointment.    Thanks,  Dr. Marius Ditch  [facility], [place] [address]  Phone: [phone]  Fax: [phone]

## 2018-08-31 NOTE — Telephone Encounter (Signed)
Left message

## 2018-09-01 NOTE — Telephone Encounter (Signed)
Sent letter

## 2018-11-05 LAB — HEMOGLOBIN A1C
Estimated Avg Glucose, External: 110 mg/dL (ref 91–123)
Hemoglobin A1C, External: 5.5 % (ref 4.8–5.6)

## 2018-11-29 ENCOUNTER — Ambulatory Visit: Payer: MEDICARE | Primary: Family Medicine

## 2018-12-25 LAB — HEMOGLOBIN A1C
Estimated Avg Glucose, External: 109 mg/dL (ref 91–123)
Hemoglobin A1C, External: 5.4 % (ref 4.8–5.6)

## 2019-02-03 ENCOUNTER — Encounter

## 2019-02-04 MED ORDER — SPIRONOLACTONE 25 MG TAB
25 mg | ORAL_TABLET | ORAL | 1 refills | Status: DC
Start: 2019-02-04 — End: 2020-01-30

## 2019-02-04 NOTE — Telephone Encounter (Signed)
Please schedule for a 30 min in office apt in April. Please schedule for labs 1 wk before her apt.    Dr. Marius Ditch  [facility], [place] [address]  Phone: [phone]  Fax: [phone]

## 2019-02-04 NOTE — Addendum Note (Signed)
Addended by: Azzie Almas A on: 02/04/2019 02:36 PM     Modules accepted: Orders

## 2019-02-04 NOTE — Addendum Note (Signed)
Addendum Note by Leo Rod, MD at 02/04/19 1436                Author: Leo Rod, MD  Service: --  Author Type: Physician       Filed: 02/04/19 1436  Encounter Date: 02/03/2019  Status: Signed          Editor: Leo Rod, MD (Physician)          Addended by: Azzie Almas A on: 02/04/2019 02:36 PM    Modules accepted: Orders

## 2019-02-07 NOTE — Telephone Encounter (Signed)
LMTRC

## 2019-02-21 NOTE — Telephone Encounter (Signed)
Sent letter

## 2019-02-22 ENCOUNTER — Encounter: Payer: MEDICARE | Primary: Family Medicine

## 2019-02-24 ENCOUNTER — Inpatient Hospital Stay: Payer: MEDICARE | Primary: Family Medicine

## 2019-08-15 LAB — HEMOGLOBIN A1C
Estimated Avg Glucose, External: 125 mg/dL — ABNORMAL HIGH (ref 91–123)
Hemoglobin A1C, External: 6 % — ABNORMAL HIGH (ref 4.8–5.6)

## 2019-08-22 ENCOUNTER — Ambulatory Visit: Admit: 2019-08-22 | Discharge: 2019-08-22 | Payer: MEDICARE | Attending: Family Medicine | Primary: Family Medicine

## 2019-08-22 ENCOUNTER — Encounter

## 2019-08-22 ENCOUNTER — Ambulatory Visit: Attending: Family Medicine | Primary: Family Medicine

## 2019-08-22 DIAGNOSIS — Z1231 Encounter for screening mammogram for malignant neoplasm of breast: Secondary | ICD-10-CM

## 2019-08-22 DIAGNOSIS — I5022 Chronic systolic (congestive) heart failure: Secondary | ICD-10-CM

## 2019-08-22 MED ORDER — SHINGRIX (PF) 50 MCG/0.5 ML INTRAMUSCULAR SUSPENSION, KIT
50 mcg/0.5 mL | INTRAMUSCULAR | 0 refills | Status: AC
Start: 2019-08-22 — End: 2019-10-23

## 2019-08-22 MED ORDER — DIPHTH,PERTUSSIS(ACELL),TETANUS 2.5 LF UNIT-8 MCG-5 LF/0.5 ML IM SUSP
Freq: Once | INTRAMUSCULAR | 0 refills | Status: AC
Start: 2019-08-22 — End: 2019-08-22

## 2019-08-22 NOTE — ACP (Advance Care Planning) (Signed)
Advance Care Planning  Advance Care Planning (ACP) Provider Conversation     Date of ACP Conversation: 08/22/19  Persons included in Conversation:  patient    Authorized Decision Maker (if patient is incapable of making informed decisions):   This person is:   Pharmacist, community in [employer], [employer]          For Patients with Decision Making Capacity:   Values/Goals: Exploration of values, goals, and preferences if recovery is not expected, even with continued medical treatment in the event of:  Imminent death  Severe, permanent brain injury    Conversation Outcomes / Follow-Up Plan:   She is addending her preexisting advance directive. She wants her husband to be her primary POA as per her original advance directive but wants to add her 4 daughters and son as the successor POAs      Dr. Marius Ditch  [facility], [place] [address]  Phone: [phone]  Fax: [phone]

## 2019-08-22 NOTE — Progress Notes (Signed)
[facility]:  08/22/19, [id number]      The Subsequent Medicare Annual Wellness Exam PROGRESS NOTE    This is a Subsequent Medicare Annual Wellness Exam (AWV).    I have reviewed the patient's medical history in detail and updated the computerized patient record.     Cathy [name] is a 69 y.o. African American female and presents for an annual wellness exam.    SUBJECTIVE    Past Medical History:   Diagnosis Date   ??? Age related osteoporosis    ??? Automatic implantable cardiac defibrillator in situ     Post ddd icd Set up [facility]   ??? Baker cyst, left 12/2016   ??? Breast mass, left     benign   ??? CHF (congestive heart failure) ([place]) 06/23/2008    Non-ischemic CMP , Cath (2008) Normal coronary arteries   ??? Chronic systolic heart failure (HCC)     Stable,    ??? Degenerative arthritis of left knee    ??? Fibroid 06/23/2008   ??? History of brain tumor 2013   ??? HLD (hyperlipidemia)    ??? HTN (hypertension) 06/23/2008   ??? Hypotension, unspecified     Related to lisinopril   ??? 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 Left knee pain     With swelling   ??? Leg pain    ??? LVAD (left ventricular assist device) present ([place]) 03/2017   ??? 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 OSA (obstructive sleep apnea) 9/10   ??? Venous reflux    ??? Wears glasses       Past Surgical History:   Procedure Laterality Date   ??? FULL ESOPHAGEAL MANOMETRY  02/16/2017        ??? HX BREAST BIOPSY      rt breast, benign   ??? HX CHOLECYSTECTOMY     ??? HX CRANIOTOMY  2013    meningioma    ??? HX HEENT      glasses   ??? HX HYSTERECTOMY     ??? HX IMPLANTABLE CARDIOVERTER DEFIBRILLATOR     ??? HX PACEMAKER  2013    dual chamber icd   ??? HX TONSIL AND ADENOIDECTOMY     ??? HX TOTAL ABDOMINAL HYSTERECTOMY  1996   ??? HX TUBAL LIGATION       Current Outpatient Medications   Medication Sig Dispense Refill   ??? acetaminophen (TylenoL) 325 mg tablet Take  by mouth every four (4) hours as needed for Pain.     ???  tacrolimus (PROGRAF) 1 mg capsule Take 8 mg every morning, take 7 mg every evening     ??? ondansetron (ZOFRAN ODT) 8 mg disintegrating tablet      ??? ondansetron (ZOFRAN ODT) 8 mg disintegrating tablet Take 8 mg by mouth.     ??? omeprazole (PRILOSEC) 20 mg capsule      ??? methylphenidate HCl (RITALIN) 5 mg tablet Take 5 mg by mouth.     ??? melatonin 3 mg tablet Take 3 mg by mouth.     ??? loratadine (CLARITIN) 10 mg tablet TAKE ONE TABLET BY MOUTH DAILY     ??? lidocaine 4 % patch 1 Patch by TransDERmal route daily.     ??? levETIRAcetam (KEPPRA) 500 mg tablet Take 500 mg by mouth nightly.     ??? furosemide (LASIX) 20 mg tablet      ??? Flovent  HFA 220 mcg/actuation inhaler      ??? fluticasone propionate (FLONASE) 50 mcg/actuation nasal spray 1 Spray daily.     ??? DULoxetine (CYMBALTA) 30 mg capsule Take 30 mg by mouth daily.     ??? docusate sodium (COLACE) 100 mg capsule Take 100 mg by mouth daily.     ??? diclofenac (VOLTAREN) 1 % gel Apply 2 g to affected area four (4) times daily.     ??? ARIPiprazole (ABILIFY) 5 mg tablet      ??? spironolactone (ALDACTONE) 25 mg tablet TAKE ONE TABLET BY MOUTH DAILY 90 Tab 1   ??? atorvastatin (LIPITOR) 40 mg tablet Take 40 mg by mouth daily.     ??? ergocalciferol (ERGOCALCIFEROL) 50,000 unit capsule Take 50,000 Units by mouth every seven (7) days. (Patient not taking: Reported on 08/22/2019)     ??? aspirin (ASPIRIN) 325 mg tablet Take 325 mg by mouth daily. (Patient not taking: Reported on 08/22/2019)     ??? warfarin (COUMADIN) 1 mg tablet 3mg  daily (Patient not taking: Reported on 08/22/2019)     ??? bisacodyl (DULCOLAX) 5 mg EC tablet Take 5 mg by mouth daily as needed. (Patient not taking: Reported on 08/22/2019)     ??? polyethylene glycol (MIRALAX) 17 gram packet Take 17 g by mouth daily as needed. (Patient not taking: Reported on 08/22/2019)     ??? calcium carbonate (OS-CAL) 500 mg calcium (1,250 mg) tablet Take 1,250 mg by mouth daily. (Patient not taking: Reported on 08/22/2019)       Allergies   Allergen  Reactions   ??? Penicillamine Itching   ??? Penicillins Rash and Itching     Itching bumps on hands and arms  Itching bumps on hands and arms  Itching bumps on hands and arms  Has patient had a PCN reaction causing immediate rash, facial/tongue/throat swelling, SOB or lightheadedness with hypotension: Yes  Has patient had a PCN reaction causing severe rash involving mucus membranes or skin necrosis: No  Has patient had a PCN reaction that required hospitalization unknown  Has patient had a PCN reaction occurring within the last 10 years: No  If all of the above answers are "NO", then may proceed with Cephalosporin use.    Itching bumps on hands and arms   ??? Tramadol Other (comments)     Psychotic Hallucinations  Psychotic Hallucinations  hallucinations  Psychotic Hallucinations     Family History   Problem Relation Age of Onset   ??? Diabetes Mother    ??? Heart Attack Father         MI   ??? Heart Disease Maternal Grandmother    ??? Heart Disease Paternal Grandmother    ??? Kidney Disease Other         1 sib deceased   ??? Cancer Other         1 sib throat cancer   ??? Diabetes Other    ??? Diabetes Daughter    ??? Other Daughter         leukemia   ??? Hypertension Other      Social History     Tobacco Use   ??? Smoking status: Never Smoker   ??? Smokeless tobacco: Never Used   Substance Use Topics   ??? Alcohol use: No     Patient Active Problem List   Diagnosis Code   ??? Fibroid D21.9   ??? HTN (hypertension) I10   ??? Gallstones K80.20   ??? Insomnia G47.00   ??? OSA (  obstructive sleep apnea) G47.33   ??? Cataract H26.9   ??? AICD (automatic cardioverter/defibrillator) present Z95.810   ??? Chronic systolic heart failure (HCC) I50.22   ??? Automatic implantable cardioverter-defibrillator in situ Z95.810   ??? Mixed hyperlipidemia E78.2   ??? AR (allergic rhinitis) J30.9   ??? Non-rheumatic mitral regurgitation I34.0   ??? Age-related osteoporosis without current pathological fracture M81.0   ??? Multinodular goiter E04.2   ??? History of meningioma Z86.018   ???  Esophageal dysphagia R13.10     The patient is a 69 year old female with history of hypertension, chronic systolic heart failure??(followed by Dr.McCray) with EF of 10%, mitral regurgitation, cholelithiasis, multinodular goiter, obstructive sleep apnea??(not on rx), allergic rhinitis, osteoporosis, uterine fibroids per EHR, insomnia, cataracts,??dysphagia from achalasia?,??LVAD placement in 2019,??AICD placement,??S/p heart transplant (2020, [facility]), seizure, left distal radial artery occlusion (2020), left Baker's cyst,??hyperlipidemia, and meningioma status post resection.    Health Maintenance History  Immunizations reviewed:   Tdap over-due   Pneumovax: up to date   Flu: due later this year  Zoster: over-due    Immunization History   Administered Date(s) Administered   ??? COVID-19, PFIZER, MRNA, LNP-S, PF, 30MCG/0.3ML DOSE 05/12/2019   ??? Hep B Vaccine (Adult) 11/12/2017, 08/05/2018   ??? Hep B, Adol/Ped 05/14/2017, 06/10/2017   ??? Hib (HbOC) 09/23/2017   ??? Influenza High Dose Vaccine PF 02/06/2016   ??? Influenza Vaccine 11/27/2008, 12/19/2009, 11/11/2011, 01/27/2013, 01/27/2013, 02/27/2014, 02/06/2016, 11/12/2016, 11/12/2017, 12/08/2017   ??? Influenza Vaccine [employer]) PF (>6 Mo Flulaval, Fluarix, and >3 Yrs Afluria, Fluzone 16109) 02/27/2014   ??? Influenza Vaccine (Tri) Adjuvanted (>65 Yrs FLUAD TRI 60454) 11/12/2016   ??? Influenza Vaccine PF 11/11/2011   ??? Influenza Vaccine Split 11/27/2008, 12/19/2009   ??? Pneumococcal Conjugate (PCV-13) 04/25/2015, 08/05/2018   ??? Pneumococcal Polysaccharide (PPSV-23) 04/25/2015, 11/27/2016   ??? TB Skin Test (PPD) 03/31/2017       Colonoscopy: Up to date. No bleeding.      Eye exam: Up to date    Mammo: Overdue    Dexascan: Up to date    Pelvic/Pap: No bleeding.      Review of Systems   Constitutional: Negative for chills and fever.   HENT: Negative for ear pain and sore throat.    Eyes: Negative for blurred vision and pain.   Respiratory: Positive for shortness of  breath.    Cardiovascular: Negative for chest pain.   Gastrointestinal: Negative for abdominal pain, blood in stool and melena.   Genitourinary: Negative for dysuria and hematuria.   Musculoskeletal: Negative for joint pain and myalgias.   Skin: Negative for rash.   Neurological: Negative for tingling, focal weakness and headaches.   Endo/Heme/Allergies: Does not bruise/bleed easily.   Psychiatric/Behavioral: Negative for depression and substance abuse.       Depression Risk Factor Screening:      Patient Health Questionnaire (PHQ-2)   Over the last 2 weeks, how often have you been bothered by any of the following problems?  ?? Little interest or pleasure in doing things?  ?? Several days. [1]  ?? Feeling down, depressed, or hopeless?   ?? Several days. [1]    Total Score: 2/6  PHQ-2 Assessment Scoring:   A score of 2 or more requires further screening with the PHQ-9    See E/M portion of visit.  Alcohol Risk Factor Screening:   Women:   On any occasion during the past 3 months, have you had more than 3 drinks containing  alcohol? no    Do you average more than 7 drinks per week? no    Tobacco Use Screening:     Social History     Tobacco Use   Smoking Status Never Smoker   Smokeless Tobacco Never Used       Hearing Loss    The patient wears hearing aids.    Activities of Daily Living   Self-care.   Requires assistance with: no ADLs  Her daughters help her with IADLs    Fall Risk   she suffered one accidental fall after hospital d/c; no injury    Abuse Screen   None    Additional Examination Findings:  Vitals:    08/22/19 1158   BP: 103/61   Pulse: 100   Resp: 16   Temp: 96.9 ??F (36.1 ??C)   TempSrc: Temporal   SpO2: 100%   Weight: 161 lb 6.4 oz (73.2 kg)   Height: 5\' 10"  (1.778 m)   PainSc:   0 - No pain      Body mass index is 23.16 kg/m??.     Evaluation of Cognitive Function:  Mood/affect: Euthymic  Appearance: Well-groomed  Family member/caregiver input: She is not with a family member today      General:    Well-nourished, well-groomed, pleasant, alert, in no acute distress.     Head:  Normocephalic, atraumatic  Ears:  External ears WNL  Eyes:  Clear sclera  Neuro:   Alert, conversant, appropriate, following commands, no sensory deficit   Skin:    No rashes noted  Psych: Affect, mood and judgment appropriate      Dementia Screen:  Clock Drawing (ten past eleven) Exercise: Unremarkable.      LABS   Data Review:   Lab Results   Component Value Date/Time    Sodium 140 10/05/2017 10:40 AM    Potassium 4.1 10/05/2017 10:40 AM    Chloride 104 10/05/2017 10:40 AM    CO2 30 10/05/2017 10:40 AM    Anion gap 6 10/05/2017 10:40 AM    Glucose 89 10/05/2017 10:40 AM    BUN 16 10/05/2017 10:40 AM    Creatinine 0.79 10/05/2017 10:40 AM    BUN/Creatinine ratio 20 10/05/2017 10:40 AM    GFR est AA >60 10/05/2017 10:40 AM    GFR est non-AA >60 10/05/2017 10:40 AM    Calcium 9.1 10/05/2017 10:40 AM       Lab Results   Component Value Date/Time    WBC 5.2 10/05/2017 10:40 AM    Hemoglobin, POC 11.6 (L) 01/28/2018 02:24 PM    HGB 10.0 (L) 10/05/2017 10:40 AM    Hematocrit, POC 34 (L) 01/28/2018 02:24 PM    HCT 33.2 (L) 10/05/2017 10:40 AM    PLATELET 261 10/05/2017 10:40 AM    MCV 91.5 10/05/2017 10:40 AM       Lab Results   Component Value Date/Time    Hemoglobin A1c 5.8 12/09/2012 10:50 AM       Lab Results   Component Value Date/Time    Cholesterol, total 189 10/05/2017 10:40 AM    HDL Cholesterol 80 (H) 10/05/2017 10:40 AM    LDL, calculated 97 10/05/2017 10:40 AM    VLDL, calculated 12 10/05/2017 10:40 AM    Triglyceride 60 10/05/2017 10:40 AM    CHOL/HDL Ratio 2.4 10/05/2017 10:40 AM       Patient Care Team:  Leo Rod, MD as PCP - General (Family Medicine)  Leo Rod,  MD as PCP - [facility] Empaneled Provider  Kerrin Champagne, MD (Ophthalmology)  Tona Sensing, MD (Cardiology)    End-of-life planning  Advanced Directive in the case than an injury or illness causes the patient to be unable to make health care decisions was  discussed with the patient.     Advice/Referrals/Counselling/Plan:   Education and counseling provided:  Are appropriate based on today's review and evaluation  End-of-Life planning (with patient's consent)  Pneumococcal Vaccine  Influenza Vaccine  Screening Mammography  Screening Pap and pelvic (covered once every 2 years)  Colorectal cancer screening tests  Cardiovascular screening blood test  Bone mass measurement (DEXA)  Screening for glaucoma  Diabetes screening test  Include in education list (weight loss, physical activity, smoking cessation, fall prevention, and nutrition)    ICD-10-CM ICD-9-CM    1. Chronic systolic heart failure (HCC)  J88.41 660.63 METABOLIC PANEL, COMPREHENSIVE      TSH AND FREE T4      LIPID PANEL      CBC WITH AUTOMATED DIFF   2. Thyroid nodule  E04.1 241.0 US THYROID/PARATHYROID/SOFT TISS   3. Anemia, unspecified type  K16.0 109.3 METABOLIC PANEL, COMPREHENSIVE      TSH AND FREE T4      CBC WITH AUTOMATED DIFF      IRON PROFILE      FERRITIN   4. Postmenopausal  Z78.0 V49.81 DEXA BONE DENSITY STUDY AXIAL   5. Encounter for screening mammogram for breast cancer  Z12.31 V76.12 MAM 3D TOMO W MAMMO BI SCREENING INCL CAD     reviewed diet, exercise and weight control.  Brief written plan, checklist    Assessment/Plan:    Health Maintenance:  - I encouraged her to get all recommended vaccinations/screenings.          Lab review: labs are reviewed in the [facility] (ACP) Provider Conversation     Date of ACP Conversation: 08/22/19  Persons included in Conversation:  patient    Authorized Decision Maker (if patient is incapable of making informed decisions):   This person is:   Pharmacist, [employer]          For Patients with Decision Making Capacity:   Values/Goals: Exploration of values, goals, and preferences if recovery is not expected, even with continued medical treatment in the event of:  Imminent  death  Severe, permanent brain injury    Conversation Outcomes / Follow-Up Plan:   She is addending her preexisting advance directive. She wants her husband to be her primary POA as per her original advance directive but wants to add her 4 daughters and son as the successor POAs        I have discussed the diagnosis with the patient and the intended plan as seen in the above orders.  The patient has received an after-visit summary and questions were answered concerning future plans.  I have discussed medication side effects and warnings with the patient as well.I have reviewed the plan of care with the patient, accepted their input and they are in agreement with the treatment goals.

## 2019-08-22 NOTE — Progress Notes (Signed)
[facility]:  08/22/2019, MRN: [id number]      Cathy [name] is a 69 y.o. female and presents to clinic for CHF (heart transplant f/u) and Annual Wellness Visit    Subjective:   The patient is a 69 year old female with history of hypertension, chronic systolic heart failure??(followed by Dr.McCray) with EF of 10%, mitral regurgitation, cholelithiasis, multinodular goiter, obstructive sleep apnea??(not on rx), allergic rhinitis, osteoporosis, uterine fibroids per EHR, insomnia, cataracts,??dysphagia from achalasia?,??LVAD placement in 2019,??AICD placement,??S/p heart transplant (2020, followed by [facility]), seizure, left distal radial artery occlusion (2020), left Baker's cyst,??hyperlipidemia, and meningioma status post resection.    1. CHF/HTN: S/p heart transplant last year. She was admitted earlier this year after having AKI required dialysis.  She was also found to have a prolonged QTC, right cavitary lung mass, and chylothorax.  Next apt with Cardiology: next month. Taking: Tacrolimus, Lasix, spironolactone, and lipitor. ASA and coumadin were d/c after her transplant surgery. Today she reports: she has 3 daughters that help with her. One of her daughters helps with transportation and house hold chores. She is on 2L of oxygen. Her SOB is chronic and unchanged. She occasionally has CP (left sided); she was told that her CP is from her transplant surgery.      08/15/19 Echocardiogram: History of cardiac transplant.  Left ventricular systolic function is normal with an ejection fraction of  55 % by visual estimation.  Left ventricular diastolic function: indeterminate.  Left ventricular chamber volume is decreased.  There is asymmetrical left ventricular hypertrophy with a mildly thickened  posterior wall.  Right ventricular systolic function is normal with TAPSE measuring 1.38 cm.  Right ventricular chamber dimension is normal.  No pulmonary hypertension, estimated pulmonary arterial systolic  pressure  is 30 mmHg.  Compared to prior study from 04/28/2019: EF 65%, Mildly reduced RV  function, PAP 45-57 mmHg.     2. Thyroid Nodule Hx: She had an incidental finding noted on a imaging study that showed a thyroid nodule.  A follow-up ultrasound is recommended.    12/28/18 CTA Head/Neck: No acute thromboembolism is identified. No significant appearing arterial stenoses  Small outpouchings off posterior paraclinoid RICA (3 mm) and LICA (4 mm), which are nonspecific. Good chance that these are benign infundibulum, but aneurysm/aneurysms possible. Follow-up is recommended, at least CTA head one year. Could refer to neurovascular clinician. Extensive upper chest pleural/parenchymal disease.  Left pleural effusion, moderate size, is larger compared previous day  Left thyroid 2 cm nodule, nonspecific  Recommend thyroid ultrasound. Near complete opacification right mastoid/middle ear space suggested obstructive/inflammatory. Sizable left mastoid effusion. No bone destruction evident. Recommend at least clinical correlation. Extensive low attenuation right frontal lobe, appearance of stable gliosis       3. Right Cavitary Mass: Slowly resolving per her chest CT from May. Followed by: Pulmonology. Oxygen: yes. No cough. This complicated her recovery from her heart transplant, and ultimately resulted in her 7 month hospitalization.     Her COVID-19 Vaccination (04/25/2019 and 05/12/19)    07/15/19 Chest CT: Persistent right lung consolidative changes with cavitation but overall interval decrease of amount of consolidation in comparison to last CT 04/25/2019. Trace right pleural effusion.  Stable exophytic lesion in the left kidney similar to prior studies.     03/16/19 Bone scan: Abnormal white cell localization in the posterior right lung corresponding with known cavitary lung consolidation. Finding compatible with active lung infection. White cell localization would be expected in both abscess and mycetoma. ??False positives  from tumor or associated hemorrhage also in differential. Ultimately tissue sampling may be needed for differentiation. Subtle focal activity upper midline chest on the frontal view only. This is in the region of the manubrium. Patient reportedly has bandaging in this area, suggesting known underlying inflammation/infection and this may be cause of the white cell localization. Correlate clinically.     4.  Anemia: Noted on recent labs per the [facility].  No bleeding history.    5. OSA: Her cardiologist referred her to Sleep Medicine for treatment.  Today she reports: s/p a sleep study  Last night.     6. Seizure Hx and Depression/Anxiety: Treated with Abilify, Cymbalta, and Ritalin.  She was started on Keppra given suspected seizures over the past year. Today she reports: she does not see a Neurologist. She was put on these rx by her Cardiology team. She admits to some depression while she was hospitalized for 7 months due to her pulmonary cavitary infection mentioned above.    7.  Asthma: Treated with Flovent.  No cough. No wheezing.        Patient Active Problem List    Diagnosis Date Noted   ??? Esophageal dysphagia 03/17/2017   ??? Age-related osteoporosis without current pathological fracture 03/04/2016   ??? Multinodular goiter 03/04/2016   ??? History of meningioma 03/04/2016   ??? Non-rheumatic mitral regurgitation 04/25/2014   ??? AR (allergic rhinitis) 09/29/2011   ??? Chronic systolic heart failure ([place])    ??? Automatic implantable cardioverter-defibrillator in situ    ??? Mixed hyperlipidemia    ??? AICD (automatic cardioverter/defibrillator) present 11/15/2010   ??? Cataract 12/01/2008   ??? Gallstones 11/13/2008   ??? Insomnia 11/13/2008   ??? OSA (obstructive sleep apnea) 11/13/2008   ??? Fibroid 06/23/2008   ??? HTN (hypertension) 06/23/2008       Current Outpatient Medications   Medication Sig Dispense Refill   ??? acetaminophen (TylenoL) 325 mg tablet Take  by mouth every four (4) hours as needed for Pain.     ??? tacrolimus  (PROGRAF) 1 mg capsule Take 8 mg every morning, take 7 mg every evening     ??? ondansetron (ZOFRAN ODT) 8 mg disintegrating tablet      ??? ondansetron (ZOFRAN ODT) 8 mg disintegrating tablet Take 8 mg by mouth.     ??? omeprazole (PRILOSEC) 20 mg capsule      ??? methylphenidate HCl (RITALIN) 5 mg tablet Take 5 mg by mouth.     ??? melatonin 3 mg tablet Take 3 mg by mouth.     ??? loratadine (CLARITIN) 10 mg tablet TAKE ONE TABLET BY MOUTH DAILY     ??? lidocaine 4 % patch 1 Patch by TransDERmal route daily.     ??? levETIRAcetam (KEPPRA) 500 mg tablet Take 500 mg by mouth nightly.     ??? furosemide (LASIX) 20 mg tablet      ??? Flovent HFA 220 mcg/actuation inhaler      ??? fluticasone propionate (FLONASE) 50 mcg/actuation nasal spray 1 Spray daily.     ??? DULoxetine (CYMBALTA) 30 mg capsule Take 30 mg by mouth daily.     ??? docusate sodium (COLACE) 100 mg capsule Take 100 mg by mouth daily.     ??? diclofenac (VOLTAREN) 1 % gel Apply 2 g to affected area four (4) times daily.     ??? ARIPiprazole (ABILIFY) 5 mg tablet      ??? spironolactone (ALDACTONE) 25 mg tablet TAKE ONE TABLET BY MOUTH DAILY  90 Tab 1   ??? atorvastatin (LIPITOR) 40 mg tablet Take 40 mg by mouth daily.     ??? ergocalciferol (ERGOCALCIFEROL) 50,000 unit capsule Take 50,000 Units by mouth every seven (7) days. (Patient not taking: Reported on 08/22/2019)     ??? aspirin (ASPIRIN) 325 mg tablet Take 325 mg by mouth daily. (Patient not taking: Reported on 08/22/2019)     ??? warfarin (COUMADIN) 1 mg tablet 3mg  daily (Patient not taking: Reported on 08/22/2019)     ??? bisacodyl (DULCOLAX) 5 mg EC tablet Take 5 mg by mouth daily as needed. (Patient not taking: Reported on 08/22/2019)     ??? polyethylene glycol (MIRALAX) 17 gram packet Take 17 g by mouth daily as needed. (Patient not taking: Reported on 08/22/2019)     ??? calcium carbonate (OS-CAL) 500 mg calcium (1,250 mg) tablet Take 1,250 mg by mouth daily. (Patient not taking: Reported on 08/22/2019)         Allergies   Allergen Reactions    ??? Penicillamine Itching   ??? Penicillins Rash and Itching     Itching bumps on hands and arms  Itching bumps on hands and arms  Itching bumps on hands and arms  Has patient had a PCN reaction causing immediate rash, facial/tongue/throat swelling, SOB or lightheadedness with hypotension: Yes  Has patient had a PCN reaction causing severe rash involving mucus membranes or skin necrosis: No  Has patient had a PCN reaction that required hospitalization unknown  Has patient had a PCN reaction occurring within the last 10 years: No  If all of the above answers are "NO", then may proceed with Cephalosporin use.    Itching bumps on hands and arms   ??? Tramadol Other (comments)     Psychotic Hallucinations  Psychotic Hallucinations  hallucinations  Psychotic Hallucinations       Past Medical History:   Diagnosis Date   ??? Age related osteoporosis    ??? Automatic implantable cardiac defibrillator in situ     Post ddd icd Set up carelink   ??? Baker cyst, left 12/2016   ??? Breast mass, left     benign   ??? CHF (congestive heart failure) ([place]) 06/23/2008    Non-ischemic CMP , Cath (2008) Normal coronary arteries   ??? Chronic systolic heart failure ([facility])     Stable,    ??? Degenerative arthritis of left knee    ??? Fibroid 06/23/2008   ??? History of brain tumor 2013   ??? HLD (hyperlipidemia)    ??? HTN (hypertension) 06/23/2008   ??? Hypotension, unspecified     Related to lisinopril   ??? 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 Left knee pain     With swelling   ??? Leg pain    ??? LVAD (left ventricular assist device) present ([place]) 03/2017   ??? 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 OSA (obstructive sleep apnea) 9/10   ??? Venous reflux    ??? Wears glasses        Past Surgical History:   Procedure Laterality Date   ??? FULL ESOPHAGEAL MANOMETRY  02/16/2017        ??? HX BREAST BIOPSY      rt breast, benign   ??? HX CHOLECYSTECTOMY     ??? HX CRANIOTOMY  2013    meningioma    ??? HX HEENT      glasses    ???  HX HYSTERECTOMY     ??? HX IMPLANTABLE CARDIOVERTER DEFIBRILLATOR     ??? HX PACEMAKER  2013    dual chamber icd   ??? HX TONSIL AND ADENOIDECTOMY     ??? HX TOTAL ABDOMINAL HYSTERECTOMY  1996   ??? HX TUBAL LIGATION         Family History   Problem Relation Age of Onset   ??? Diabetes Mother    ??? Heart Attack Father         MI   ??? Heart Disease Maternal Grandmother    ??? Heart Disease Paternal Grandmother    ??? Kidney Disease Other         1 sib deceased   ??? Cancer Other         1 sib throat cancer   ??? Diabetes Other    ??? Diabetes Daughter    ??? Other Daughter         leukemia   ??? Hypertension Other        Social History     Tobacco Use   ??? Smoking status: Never Smoker   ??? Smokeless tobacco: Never Used   Substance Use Topics   ??? Alcohol use: No       ROS   Review of Systems   Constitutional: Negative for chills and fever.   HENT: Positive for hearing loss (wears a hearing aid). Negative for ear pain and sore throat.    Eyes: Positive for blurred vision. Negative for pain.   Respiratory: Positive for shortness of breath. Negative for cough.    Cardiovascular: Negative for chest pain.   Gastrointestinal: Negative for abdominal pain, blood in stool and melena.   Genitourinary: Negative for dysuria and hematuria.   Musculoskeletal: Negative for joint pain and myalgias.   Skin: Negative for rash.   Neurological: Negative for tingling, focal weakness and headaches.   Endo/Heme/Allergies: Does not bruise/bleed easily.   Psychiatric/Behavioral: Negative for substance abuse.       Objective     Vitals:    08/22/19 1158   BP: 103/61   Pulse: 100   Resp: 16   Temp: 96.9 ??F (36.1 ??C)   TempSrc: Temporal   SpO2: 100%   Weight: 161 lb 6.4 oz (73.2 kg)   Height: 5\' 10"  (1.778 m)   PainSc:   0 - No pain       Physical Exam  Vitals and nursing note reviewed.   HENT:      Head: Normocephalic and atraumatic.      Right Ear: External ear normal.      Left Ear: External ear normal.   Eyes:      General: No scleral icterus.        Right eye: No  discharge.         Left eye: No discharge.      Conjunctiva/sclera: Conjunctivae normal.   Cardiovascular:      Rate and Rhythm: Normal rate and regular rhythm.      Heart sounds: Normal heart sounds. No murmur heard.   No friction rub. No gallop.    Pulmonary:      Effort: Pulmonary effort is normal. No respiratory distress.      Breath sounds: Rales (upper right lung field) present. No wheezing.   Abdominal:      General: Bowel sounds are normal. There is no distension.      Palpations: Abdomen is soft. There is no mass.      Tenderness: There  is no abdominal tenderness. There is no guarding or rebound.   Musculoskeletal:         General: No swelling (BUE) or tenderness (BUE).      Cervical back: Neck supple.   Lymphadenopathy:      Cervical: No cervical adenopathy.   Skin:     General: Skin is warm and dry.      Findings: No erythema.   Neurological:      Mental Status: She is alert and oriented to person, place, and time.      Motor: No abnormal muscle tone.      Gait: Gait is intact.   Psychiatric:         Mood and Affect: Mood and affect normal.         LABS   Data Review:   Lab Results   Component Value Date/Time    WBC 5.2 10/05/2017 10:40 AM    Hemoglobin, POC 11.6 (L) 01/28/2018 02:24 PM    HGB 10.0 (L) 10/05/2017 10:40 AM    Hematocrit, POC 34 (L) 01/28/2018 02:24 PM    HCT 33.2 (L) 10/05/2017 10:40 AM    PLATELET 261 10/05/2017 10:40 AM    MCV 91.5 10/05/2017 10:40 AM       Lab Results   Component Value Date/Time    Sodium 140 10/05/2017 10:40 AM    Potassium 4.1 10/05/2017 10:40 AM    Chloride 104 10/05/2017 10:40 AM    CO2 30 10/05/2017 10:40 AM    Anion gap 6 10/05/2017 10:40 AM    Glucose 89 10/05/2017 10:40 AM    BUN 16 10/05/2017 10:40 AM    Creatinine 0.79 10/05/2017 10:40 AM    BUN/Creatinine ratio 20 10/05/2017 10:40 AM    GFR est AA >60 10/05/2017 10:40 AM    GFR est non-AA >60 10/05/2017 10:40 AM    Calcium 9.1 10/05/2017 10:40 AM       Lab Results   Component Value Date/Time    Cholesterol,  total 189 10/05/2017 10:40 AM    HDL Cholesterol 80 (H) 10/05/2017 10:40 AM    LDL, calculated 97 10/05/2017 10:40 AM    VLDL, calculated 12 10/05/2017 10:40 AM    Triglyceride 60 10/05/2017 10:40 AM    CHOL/HDL Ratio 2.4 10/05/2017 10:40 AM       Lab Results   Component Value Date/Time    Hemoglobin A1c 5.8 12/09/2012 10:50 AM       Assessment/Plan:   1.  CHF/HTN:  ???Continue to follow-up with Cardiology and Transplant teams.  Continue medication per their recommendations.  - Requesting records from her Cardiology team.    2.  Right Cavitary Lung Mass: Slowly resolving.  ???I encouraged her to follow-up with her specialty team for continued surveillance/recommendations.   - Requesting records from Pulmonology    3.  Thyroid nodule: Incidental finding.  ???Ordering a thyroid ultrasound. Checking labs.    4. Anemia: Etiology is unknown.  ???Checking labs.    5. OSA:   ???I encouraged her to follow-up with Sleep Medicine for treatment.    6. Depression and Anxiety: +H/o Seizures  ???Continue with medication as prescribed.    7. Asthma: Stable.  ???Continue with Rx as prescribed.      Health Maintenance Due   Topic Date Due   ??? DTaP/Tdap/Td series (1 - Tdap) Never done   ??? Shingrix Vaccine Age 77> (1 of 2) Never done   ??? Medicare Yearly Exam  08/18/2019  Lab review: labs are reviewed and ordered as mentioned above.    I have discussed the diagnosis with the patient and the intended plan as seen in the above orders.  The patient has received an after-visit summary and questions were answered concerning future plans.  I have discussed medication side effects and warnings with the patient as well. I have reviewed the plan of care with the patient, accepted their input and they are in agreement with the treatment goals. All questions were answered. The patient understands the plan of care. Handouts provided today with above information. Pt instructed if symptoms worsen to call the office or report to the ED for continued care.   Greater than 50% of the visit time was spent in counseling and/or coordination of care.      Voice recognition was used to generate this report, which may have resulted in some phonetic based errors in grammar and contents. Even though attempts were made to correct all the mistakes, some may have been missed, and remained in the body of the document.      Follow-up and Dispositions    ?? Return in about 6 weeks (around 10/03/2019) for BP check.         Leo Rod, MD

## 2019-08-22 NOTE — Progress Notes (Signed)
Please let her know that her DEXA scan shows persistent osteoporosis that has not changed since her previous study.  If she wants to discuss treatment options to strengthen her bones, please have her notify me so that I may refer her to a rheumatologist for discussion regarding treatment options.    Dr. Marius Ditch  [facility], [place] [address]  Phone: [phone]  Fax: [phone]

## 2019-08-22 NOTE — Progress Notes (Signed)
 Chief Complaint   Patient presents with   . CHF     heart transplant f/u   . Annual Wellness Visit       1. Have you been to the ER, urgent care clinic since your last visit?  Hospitalized since your last visit? Yes, Hospitalized for Heart Transplant  2. Have you seen or consulted any other health care providers outside of the [facility] since your last visit?  Include any pap smears or colon screening. Yes, [facility]

## 2019-08-23 ENCOUNTER — Inpatient Hospital Stay: Payer: MEDICARE | Primary: Family Medicine

## 2019-08-24 ENCOUNTER — Inpatient Hospital Stay: Payer: MEDICARE | Primary: Family Medicine

## 2019-08-25 ENCOUNTER — Ambulatory Visit: Payer: MEDICARE | Primary: Family Medicine

## 2019-08-30 ENCOUNTER — Inpatient Hospital Stay: Admit: 2019-08-30 | Payer: MEDICARE | Primary: Family Medicine

## 2019-08-30 ENCOUNTER — Ambulatory Visit: Admit: 2019-08-30 | Discharge: 2019-08-30 | Payer: MEDICARE | Primary: Family Medicine

## 2019-08-30 DIAGNOSIS — I10 Essential (primary) hypertension: Secondary | ICD-10-CM

## 2019-08-30 LAB — METABOLIC PANEL, COMPREHENSIVE
A-G Ratio: 1.1 (ref 0.8–1.7)
ALT (SGPT): 24 U/L (ref 13–56)
AST (SGOT): 14 U/L (ref 10–38)
Albumin: 3.9 g/dL (ref 3.4–5.0)
Alk. phosphatase: 177 U/L — ABNORMAL HIGH (ref 45–117)
Anion gap: 5 mmol/L (ref 3.0–18)
BUN/Creatinine ratio: 24 — ABNORMAL HIGH (ref 12–20)
BUN: 28 MG/DL — ABNORMAL HIGH (ref 7.0–18)
Bilirubin, total: 0.3 MG/DL (ref 0.2–1.0)
CO2: 28 mmol/L (ref 21–32)
Calcium: 9.2 MG/DL (ref 8.5–10.1)
Chloride: 107 mmol/L (ref 100–111)
Creatinine: 1.16 MG/DL (ref 0.6–1.3)
GFR est AA: 56 mL/min/{1.73_m2} — ABNORMAL LOW (ref >60)
GFR est non-AA: 46 mL/min/{1.73_m2} — ABNORMAL LOW (ref >60)
Globulin: 3.6 g/dL (ref 2.0–4.0)
Glucose: 98 mg/dL (ref 74–99)
Potassium: 5.3 mmol/L (ref 3.5–5.5)
Protein, total: 7.5 g/dL (ref 6.4–8.2)
Sodium: 140 mmol/L (ref 136–145)

## 2019-08-30 LAB — CBC WITH AUTOMATED DIFF
ABS. BASOPHILS: 0 10*3/uL (ref 0.0–0.1)
ABS. EOSINOPHILS: 0 10*3/uL (ref 0.0–0.4)
ABS. LYMPHOCYTES: 1.1 10*3/uL (ref 0.9–3.6)
ABS. MONOCYTES: 0.4 10*3/uL (ref 0.05–1.2)
ABS. NEUTROPHILS: 4.4 10*3/uL (ref 1.8–8.0)
BASOPHILS: 1 % (ref 0–2)
EOSINOPHILS: 1 % (ref 0–5)
HCT: 30.3 % — ABNORMAL LOW (ref 35.0–45.0)
HGB: 8.6 g/dL — ABNORMAL LOW (ref 12.0–16.0)
LYMPHOCYTES: 19 % — ABNORMAL LOW (ref 21–52)
MCH: 25.2 PG (ref 24.0–34.0)
MCHC: 28.4 g/dL — ABNORMAL LOW (ref 31.0–37.0)
MCV: 88.9 FL (ref 74.0–97.0)
MONOCYTES: 7 % (ref 3–10)
MPV: 11.1 FL (ref 9.2–11.8)
NEUTROPHILS: 73 % (ref 40–73)
PLATELET: 212 10*3/uL (ref 135–420)
RBC: 3.41 M/uL — ABNORMAL LOW (ref 4.20–5.30)
RDW: 19 % — ABNORMAL HIGH (ref 11.6–14.5)
WBC: 6.1 10*3/uL (ref 4.6–13.2)

## 2019-08-30 LAB — LIPID PANEL
CHOL/HDL Ratio: 2.7 (ref 0–5.0)
Chol/HDL Ratio: 2.7 (ref 0–5.0)
Cholesterol, Total: 170 MG/DL (ref <200)
Cholesterol, total: 170 MG/DL (ref <200)
HDL Cholesterol: 64 MG/DL — ABNORMAL HIGH (ref 40–60)
HDL: 64 MG/DL — ABNORMAL HIGH (ref 40–60)
LDL Calculated: 86 MG/DL (ref 0–100)
LDL, calculated: 86 MG/DL (ref 0–100)
Triglyceride: 100 MG/DL (ref <150)
Triglycerides: 100 MG/DL (ref <150)
VLDL Cholesterol Calculated: 20 MG/DL
VLDL, calculated: 20 MG/DL

## 2019-08-30 LAB — MICROALBUMIN, UR, RAND W/ MICROALB/CREAT RATIO
Creatinine, urine random: 139 mg/dL — ABNORMAL HIGH (ref 30–125)
Microalbumin,urine random: 7.13 MG/DL — ABNORMAL HIGH (ref 0–3.0)
Microalbumin/Creat ratio (mg/g creat): 51 mg/g — ABNORMAL HIGH (ref 0–30)

## 2019-08-30 LAB — IRON PROFILE
Iron % saturation: 29 % (ref 20–50)
Iron: 65 ug/dL (ref 50–175)
TIBC: 223 ug/dL — ABNORMAL LOW (ref 250–450)

## 2019-08-30 LAB — FERRITIN
Ferritin: 275 NG/ML (ref 8–388)
Ferritin: 275 NG/ML (ref 8–388)

## 2019-08-30 LAB — TSH AND FREE T4
T4, Free: 1.1 NG/DL (ref 0.7–1.5)
TSH: 2.54 u[IU]/mL (ref 0.36–3.74)

## 2019-08-30 LAB — CBC WITH AUTO DIFFERENTIAL
Basophils %: 1 % (ref 0–2)
Basophils Absolute: 0 10*3/uL (ref 0.0–0.1)
Eosinophils %: 1 % (ref 0–5)
Eosinophils Absolute: 0 10*3/uL (ref 0.0–0.4)
Hematocrit: 30.3 % — ABNORMAL LOW (ref 35.0–45.0)
Hemoglobin: 8.6 g/dL — ABNORMAL LOW (ref 12.0–16.0)
Lymphocytes %: 19 % — ABNORMAL LOW (ref 21–52)
Lymphocytes Absolute: 1.1 10*3/uL (ref 0.9–3.6)
MCH: 25.2 PG (ref 24.0–34.0)
MCHC: 28.4 g/dL — ABNORMAL LOW (ref 31.0–37.0)
MCV: 88.9 FL (ref 74.0–97.0)
MPV: 11.1 FL (ref 9.2–11.8)
Monocytes %: 7 % (ref 3–10)
Monocytes Absolute: 0.4 10*3/uL (ref 0.05–1.2)
Neutrophils %: 73 % (ref 40–73)
Neutrophils Absolute: 4.4 10*3/uL (ref 1.8–8.0)
Platelets: 212 10*3/uL (ref 135–420)
RBC: 3.41 M/uL — ABNORMAL LOW (ref 4.20–5.30)
RDW: 19 % — ABNORMAL HIGH (ref 11.6–14.5)
WBC: 6.1 10*3/uL (ref 4.6–13.2)

## 2019-08-30 LAB — COMPREHENSIVE METABOLIC PANEL
ALT: 24 U/L (ref 13–56)
AST: 14 U/L (ref 10–38)
Albumin/Globulin Ratio: 1.1 (ref 0.8–1.7)
Albumin: 3.9 g/dL (ref 3.4–5.0)
Alkaline Phosphatase: 177 U/L — ABNORMAL HIGH (ref 45–117)
Anion Gap: 5 mmol/L (ref 3.0–18)
BUN: 28 MG/DL — ABNORMAL HIGH (ref 7.0–18)
Bun/Cre Ratio: 24 — ABNORMAL HIGH (ref 12–20)
CO2: 28 mmol/L (ref 21–32)
Calcium: 9.2 MG/DL (ref 8.5–10.1)
Chloride: 107 mmol/L (ref 100–111)
Creatinine: 1.16 MG/DL (ref 0.6–1.3)
EGFR IF NonAfrican American: 46 mL/min/{1.73_m2} — ABNORMAL LOW (ref >60)
GFR African American: 56 mL/min/{1.73_m2} — ABNORMAL LOW (ref >60)
Globulin: 3.6 g/dL (ref 2.0–4.0)
Glucose: 98 mg/dL (ref 74–99)
Potassium: 5.3 mmol/L (ref 3.5–5.5)
Sodium: 140 mmol/L (ref 136–145)
Total Bilirubin: 0.3 MG/DL (ref 0.2–1.0)
Total Protein: 7.5 g/dL (ref 6.4–8.2)

## 2019-08-30 LAB — MICROALBUMIN / CREATININE URINE RATIO
Creatinine, Ur: 139 mg/dL — ABNORMAL HIGH (ref 30–125)
Microalb, Ur: 7.13 MG/DL — ABNORMAL HIGH (ref 0–3.0)
Microalbumin Creatinine Ratio: 51 mg/g — ABNORMAL HIGH (ref 0–30)

## 2019-08-30 LAB — TSH + FREE T4 PANEL
T4 Free: 1.1 NG/DL (ref 0.7–1.5)
TSH: 2.54 u[IU]/mL (ref 0.36–3.74)

## 2019-08-30 LAB — IRON AND TIBC
Iron Saturation: 29 % (ref 20–50)
Iron: 65 ug/dL (ref 50–175)
TIBC: 223 ug/dL — ABNORMAL LOW (ref 250–450)

## 2019-08-30 NOTE — Progress Notes (Signed)
Please let her know that her labs show mild microalbuminuria.  Her TFTs are normal.  Her ferritin is normal at 275.  Her iron studies are unremarkable for significant abnormalities.  Her CBC shows that her hemoglobin is 8.6, at her new baseline.  Meanwhile, her kidney and liver function labs are normal except for her alkaline phosphatase level which is elevated at 177.  This could be elevated due to her osteoporosis.  Meanwhile, her total cholesterol is 170.  Triglycerides are 100.  HDL is 64.  Her LDL is 86.  She should continue taking all medication as prescribed for now.    Dr. Marius Ditch  [facility], [place] [address]  Phone: [phone]  Fax: [phone]

## 2019-09-07 ENCOUNTER — Inpatient Hospital Stay: Payer: MEDICARE | Attending: Family Medicine | Primary: Family Medicine

## 2019-09-07 ENCOUNTER — Ambulatory Visit: Payer: MEDICARE | Primary: Family Medicine

## 2019-09-09 NOTE — Telephone Encounter (Signed)
LMTCB

## 2019-09-09 NOTE — Telephone Encounter (Signed)
-----   Message from Leo Rod, MD sent at 09/08/2019  5:10 PM EDT -----  Please let her know that her labs show mild microalbuminuria.  Her TFTs are normal.  Her ferritin is normal at 275.  Her iron studies are unremarkable for significant abnormalities.  Her CBC shows that her hemoglobin is 8.6, at her new baseline.  Meanwhile, her kidney and liver function labs are normal except for her alkaline phosphatase level which is elevated at 177.  This could be elevated due to her osteoporosis.  Meanwhile, her total cholesterol is 170.  Triglycerides are 100.  HDL is 64.  Her LDL is 86.  She should continue taking all medication as prescribed for now.    Dr. Marius Ditch  [facility], [place] [address]  Phone: [phone]  Fax: [phone]

## 2019-09-13 NOTE — Telephone Encounter (Signed)
Letter sent to patient

## 2019-09-21 ENCOUNTER — Inpatient Hospital Stay: Admit: 2019-09-21 | Payer: MEDICARE | Attending: Family Medicine | Primary: Family Medicine

## 2019-09-21 ENCOUNTER — Encounter

## 2019-09-21 ENCOUNTER — Inpatient Hospital Stay: Payer: MEDICARE | Attending: Family Medicine | Primary: Family Medicine

## 2019-09-21 ENCOUNTER — Ambulatory Visit: Payer: MEDICARE | Primary: Family Medicine

## 2019-09-21 DIAGNOSIS — Z780 Asymptomatic menopausal state: Secondary | ICD-10-CM

## 2019-09-23 ENCOUNTER — Telehealth

## 2019-09-23 NOTE — Telephone Encounter (Signed)
LMTCB

## 2019-09-23 NOTE — Telephone Encounter (Signed)
Unable to reach patient, phone signals busy tone. Will try again at a later time.

## 2019-09-23 NOTE — Telephone Encounter (Signed)
-----   Message from Glennie Hawk, MD sent at 09/22/2019  8:29 AM EDT -----  Please let her know that her DEXA scan shows persistent osteoporosis that has not changed since her previous study.  If she wants to discuss treatment options to strengthen her bones, please have her notify me so that I may refer her to a rheumatologist for discussion regarding treatment options.    Dr. Darnelle Going  [facility]  [address] [place], [address]  [place], [place] [address]  Phone: [phone]  Fax: [phone]

## 2019-09-23 NOTE — Telephone Encounter (Signed)
Pt called back asked to be called back at   [phone]

## 2019-09-30 NOTE — Telephone Encounter (Signed)
Patient notified about DEXA result per Dr Cornelia Copa. Patient is interested in seeing the rheumatologist for options.

## 2019-10-06 ENCOUNTER — Ambulatory Visit: Admit: 2019-10-06 | Discharge: 2019-10-06 | Payer: MEDICARE | Attending: Family Medicine | Primary: Family Medicine

## 2019-10-06 ENCOUNTER — Ambulatory Visit: Attending: Family Medicine | Primary: Family Medicine

## 2019-10-06 DIAGNOSIS — D649 Anemia, unspecified: Secondary | ICD-10-CM

## 2019-10-06 NOTE — Progress Notes (Signed)
[facility]:  10/06/2019, MRN: [id number]      Cathy [name] is a 69 y.o. female and presents to clinic for No chief complaint on file.    Subjective:   The patient is a 69 year old female with history of hypertension, chronic systolic heart failure??(followed by Dr.McCray) with EF of 10%, mitral regurgitation, cholelithiasis, multinodular goiter, obstructive sleep apnea??(not on rx), allergic rhinitis, osteoporosis, uterine fibroids per EHR, insomnia, cataracts,??dysphagia from achalasia?,??LVAD placement in 2019,??AICD placement,??S/p heart transplant (2020, followed by [facility]), seizure, left distal radial artery occlusion (2020), left Baker's cyst,??hyperlipidemia, and meningioma status post resection.    1. CHF/HTN: S/p heart transplant last year, with her postoperative course complicated by #3.  She is taking: Tacrolimus, Lasix, spironolactone, and Lipitor. Recent labs show:  Her total cholesterol is 170.  Triglycerides are 100.  HDL is 64.  Her LDL is 86.    BP Readings from Last 3 Encounters:   10/06/19 121/66   08/22/19 103/61   01/28/18 120/81     08/15/19 Echocardiogram: History of cardiac transplant.  Left ventricular systolic function is normal with an ejection fraction of  55 % by visual estimation.  Left ventricular diastolic function: indeterminate.  Left ventricular chamber volume is decreased.  There is asymmetrical left ventricular hypertrophy with a mildly thickened  posterior wall.  Right ventricular systolic function is normal with TAPSE measuring 1.38 cm.  Right ventricular chamber dimension is normal.  No pulmonary hypertension, estimated pulmonary arterial systolic pressure  is 30 mmHg.  Compared to prior study from 04/28/2019: EF 65%, Mildly reduced RV  function, PAP 45-57 mmHg.     2. Thyroid Nodule Hx: This was an incidental finding on the CTA of her head and neck from November of last year.  At her last appointment, I ordered TFTs.  They were normal.  At her last  appointment, I ordered a thyroid ultrasound.    12/28/18 CTA Head/Neck: No acute thromboembolism is identified. No significant appearing arterial stenoses  Small outpouchings off posterior paraclinoid RICA (3 mm) and LICA (4 mm), which are nonspecific. Good chance that these are benign infundibulum, but aneurysm/aneurysms possible. Follow-up is recommended, at least CTA head one year. Could refer to neurovascular clinician. Extensive upper chest pleural/parenchymal disease.  Left pleural effusion, moderate size, is larger compared previous day  Left thyroid 2 cm nodule, nonspecific  Recommend thyroid ultrasound. Near complete opacification right mastoid/middle ear space suggested obstructive/inflammatory. Sizable left mastoid effusion. No bone destruction evident. Recommend at least clinical correlation. Extensive low attenuation right frontal lobe, appearance of stable gliosis     3. Right Cavitary Lung Lesion: She is followed by Pulmonology and requires oxygen.  Today she reports: she is scheduled to f/u with her Pulmonology doctor. She is wearing oxygen but recently was able to ambulate without oxygen, maintaining a Pox of 92%. She down to 1L oxygen. SOB/cough: her SOB is improving.  She has a little bit of coughing spells. She is fully vaccinated against COVID-19 (04/25/2019 and 05/12/19).    07/15/19 Chest CT: Persistent right lung consolidative changes with cavitation but overall interval decrease of amount of consolidation in comparison to last CT 04/25/2019. Trace right pleural effusion.  Stable exophytic lesion in the left kidney similar to prior studies.   ??  03/16/19 Bone scan: Abnormal white cell localization in the posterior right lung corresponding with known cavitary lung consolidation. Finding compatible with active lung infection. White cell localization would be expected in both abscess and mycetoma. ??False positives from tumor  or associated hemorrhage also in differential. Ultimately tissue sampling may  be needed for differentiation. Subtle focal activity upper midline chest on the frontal view only. This is in the region of the manubrium. Patient reportedly has bandaging in this area, suggesting known underlying inflammation/infection and this may be cause of the white cell localization. Correlate clinically.   ??  4. OSA: She was referred earlier this year to a Sleep Medicine team.  Status post sleep study.  Today she reports: She has yet to get a CPAP machine.   ??  5.  Anemia: Her most recent labs show that her hemoglobin is 8.6, her new baseline.  Her renal function was normal at that time.  Her iron studies were normal.  Her TFTs were normal.  Her ferritin was normal at 275.  She is not followed by hematologist.  No bleeding.    6. Osteoporosis: Per her most recent bone density scan last month. She was referred to Rheumatology but has yet to schedule an apt.    7. Anxiety/Depression: She was hospitalized, she was started on Abilify, Cymbalta, and Ritalin after having depression symptoms from being hospitalized for several months for #1 and 3.  She is interested in coming off of these medications.  She is a faithful person and identifies as [place].  She is fine strength with prayer.  She does not believe she needs to take these medications.  She has no depression symptoms at this time.  She occasionally will feel stressed when having to deal with her husband.  No domestic violence.  Per her history today he is overly protective of her due to her multiple health conditions.  Also, the patient's house is in disarray due to her being hospitalized for several months last year and this year.  As result, the patient has some stress with getting her house clean.  Her children are coming over periodically to clean her home.      Patient Active Problem List    Diagnosis Date Noted   ??? Depression 08/30/2019   ??? History of seizures 08/30/2019   ??? Mild persistent asthma without complication 10/62/6948   ??? Cavitary lung  disease 08/30/2019   ??? Anemia 08/30/2019   ??? Thyroid nodule 08/30/2019   ??? Esophageal dysphagia 03/17/2017   ??? Age-related osteoporosis without current pathological fracture 03/04/2016   ??? Multinodular goiter 03/04/2016   ??? History of meningioma 03/04/2016   ??? Non-rheumatic mitral regurgitation 04/25/2014   ??? AR (allergic rhinitis) 09/29/2011   ??? Chronic systolic heart failure ([place])    ??? Mixed hyperlipidemia    ??? AICD (automatic cardioverter/defibrillator) present 11/15/2010   ??? Cataract 12/01/2008   ??? Gallstones 11/13/2008   ??? Insomnia 11/13/2008   ??? OSA (obstructive sleep apnea) 11/13/2008   ??? Fibroid 06/23/2008   ??? HTN (hypertension) 06/23/2008       Current Outpatient Medications   Medication Sig Dispense Refill   ??? acetaminophen (TylenoL) 325 mg tablet Take  by mouth every four (4) hours as needed for Pain.     ??? tacrolimus (PROGRAF) 1 mg capsule Take 8 mg every morning, take 7 mg every evening     ??? ondansetron (ZOFRAN ODT) 8 mg disintegrating tablet      ??? ondansetron (ZOFRAN ODT) 8 mg disintegrating tablet Take 8 mg by mouth.     ??? omeprazole (PRILOSEC) 20 mg capsule      ??? methylphenidate HCl (RITALIN) 5 mg tablet Take 5 mg by mouth.     ???  melatonin 3 mg tablet Take 3 mg by mouth.     ??? loratadine (CLARITIN) 10 mg tablet TAKE ONE TABLET BY MOUTH DAILY     ??? lidocaine 4 % patch 1 Patch by TransDERmal route daily.     ??? levETIRAcetam (KEPPRA) 500 mg tablet Take 500 mg by mouth nightly.     ??? furosemide (LASIX) 20 mg tablet      ??? Flovent HFA 220 mcg/actuation inhaler      ??? fluticasone propionate (FLONASE) 50 mcg/actuation nasal spray 1 Spray daily.     ??? DULoxetine (CYMBALTA) 30 mg capsule Take 30 mg by mouth daily.     ??? docusate sodium (COLACE) 100 mg capsule Take 100 mg by mouth daily.     ??? diclofenac (VOLTAREN) 1 % gel Apply 2 g to affected area four (4) times daily.     ??? ARIPiprazole (ABILIFY) 5 mg tablet      ??? varicella-zoster recombinant, PF, (Shingrix, PF,) 50 mcg/0.5 mL susr injection 0.5 mL by  IntraMUSCular route every two (2) months for 2 doses. 1 mL 0   ??? spironolactone (ALDACTONE) 25 mg tablet TAKE ONE TABLET BY MOUTH DAILY 90 Tab 1   ??? atorvastatin (LIPITOR) 40 mg tablet Take 40 mg by mouth daily.         Allergies   Allergen Reactions   ??? Penicillamine Itching   ??? Penicillins Rash and Itching     Itching bumps on hands and arms  Itching bumps on hands and arms  Itching bumps on hands and arms  Has patient had a PCN reaction causing immediate rash, facial/tongue/throat swelling, SOB or lightheadedness with hypotension: Yes  Has patient had a PCN reaction causing severe rash involving mucus membranes or skin necrosis: No  Has patient had a PCN reaction that required hospitalization unknown  Has patient had a PCN reaction occurring within the last 10 years: No  If all of the above answers are "NO", then may proceed with Cephalosporin use.    Itching bumps on hands and arms   ??? Tramadol Other (comments)     Psychotic Hallucinations  Psychotic Hallucinations  hallucinations  Psychotic Hallucinations       Past Medical History:   Diagnosis Date   ??? Age related osteoporosis    ??? Automatic implantable cardiac defibrillator in situ     Post ddd icd Set up carelink   ??? Baker cyst, left 12/2016   ??? Breast mass, left     benign   ??? CHF (congestive heart failure) ([place]) 06/23/2008    Non-ischemic CMP , Cath (2008) Normal coronary arteries   ??? Chronic systolic heart failure ([facility])     Stable,    ??? Degenerative arthritis of left knee    ??? Fibroid 06/23/2008   ??? History of brain tumor 2013   ??? HLD (hyperlipidemia)    ??? HTN (hypertension) 06/23/2008   ??? Hypotension, unspecified     Related to lisinopril   ??? 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 Left knee pain     With swelling   ??? Leg pain    ??? LVAD (left ventricular assist device) present ([place]) 03/2017   ??? 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 OSA (obstructive sleep apnea) 9/10   ??? Venous reflux    ???  Wears glasses        Past Surgical History:  Procedure Laterality Date   ??? FULL ESOPHAGEAL MANOMETRY  02/16/2017        ??? HX BREAST BIOPSY      rt breast, benign   ??? HX CHOLECYSTECTOMY     ??? HX CRANIOTOMY  2013    meningioma    ??? HX HEENT      glasses   ??? HX HYSTERECTOMY     ??? HX IMPLANTABLE CARDIOVERTER DEFIBRILLATOR     ??? HX PACEMAKER  2013    dual chamber icd   ??? HX TONSIL AND ADENOIDECTOMY     ??? HX TOTAL ABDOMINAL HYSTERECTOMY  1996   ??? HX TUBAL LIGATION         Family History   Problem Relation Age of Onset   ??? Diabetes Mother    ??? Heart Attack Father         MI   ??? Heart Disease Maternal Grandmother    ??? Heart Disease Paternal Grandmother    ??? Kidney Disease Other         1 sib deceased   ??? Cancer Other         1 sib throat cancer   ??? Diabetes Other    ??? Diabetes Daughter    ??? Other Daughter         leukemia   ??? Hypertension Other        Social History     Tobacco Use   ??? Smoking status: Never Smoker   ??? Smokeless tobacco: Never Used   Substance Use Topics   ??? Alcohol use: No       ROS   Review of Systems   Constitutional: Negative for chills, fever and malaise/fatigue.   HENT: Negative for ear pain and sore throat.    Eyes: Negative for blurred vision and pain.   Respiratory: Positive for cough and shortness of breath.         Improving   Cardiovascular: Negative for chest pain.   Gastrointestinal: Negative for abdominal pain, blood in stool and melena.   Genitourinary: Negative for dysuria and hematuria.   Musculoskeletal: Positive for back pain. Negative for joint pain and myalgias.   Skin: Negative for rash.   Neurological: Negative for headaches.   Endo/Heme/Allergies: Does not bruise/bleed easily.   Psychiatric/Behavioral: Negative for depression and substance abuse.       Objective     There were no vitals filed for this visit.    Physical Exam  Vitals and nursing note reviewed.   HENT:      Head: Normocephalic and atraumatic.      Right Ear: External ear normal.      Left Ear: External ear normal.       Nose: Nose normal.   Eyes:      General: No scleral icterus.        Right eye: No discharge.         Left eye: No discharge.      Conjunctiva/sclera: Conjunctivae normal.   Cardiovascular:      Rate and Rhythm: Normal rate and regular rhythm.      Heart sounds: Normal heart sounds. No murmur heard.   No friction rub. No gallop.    Pulmonary:      Effort: Pulmonary effort is normal. No respiratory distress.      Breath sounds: No wheezing or rales.      Comments: Scattered coarse breath sounds.  Her lungs sound remarkably better since her last visit.  Abdominal:  General: Bowel sounds are normal. There is no distension.      Palpations: Abdomen is soft. There is no mass.      Tenderness: There is no abdominal tenderness. There is no guarding or rebound.   Musculoskeletal:         General: No swelling or tenderness (BUE).      Cervical back: Neck supple.   Lymphadenopathy:      Cervical: No cervical adenopathy.   Skin:     General: Skin is warm and dry.      Findings: No erythema.   Neurological:      Mental Status: She is alert and oriented to person, place, and time.      Motor: No abnormal muscle tone.      Gait: Gait is intact. Gait normal.   Psychiatric:         Mood and Affect: Mood and affect normal.         LABS   Data Review:   Lab Results   Component Value Date/Time    WBC 6.1 08/30/2019 01:00 PM    Hemoglobin, POC 11.6 (L) 01/28/2018 02:24 PM    HGB 8.6 (L) 08/30/2019 01:00 PM    Hematocrit, POC 34 (L) 01/28/2018 02:24 PM    HCT 30.3 (L) 08/30/2019 01:00 PM    PLATELET 212 08/30/2019 01:00 PM    MCV 88.9 08/30/2019 01:00 PM       Lab Results   Component Value Date/Time    Sodium 140 08/30/2019 01:00 PM    Potassium 5.3 08/30/2019 01:00 PM    Chloride 107 08/30/2019 01:00 PM    CO2 28 08/30/2019 01:00 PM    Anion gap 5 08/30/2019 01:00 PM    Glucose 98 08/30/2019 01:00 PM    BUN 28 (H) 08/30/2019 01:00 PM    Creatinine 1.16 08/30/2019 01:00 PM    BUN/Creatinine ratio 24 (H) 08/30/2019 01:00 PM    GFR  est AA 56 (L) 08/30/2019 01:00 PM    GFR est non-AA 46 (L) 08/30/2019 01:00 PM    Calcium 9.2 08/30/2019 01:00 PM       Lab Results   Component Value Date/Time    Cholesterol, total 170 08/30/2019 01:00 PM    HDL Cholesterol 64 (H) 08/30/2019 01:00 PM    LDL, calculated 86 08/30/2019 01:00 PM    VLDL, calculated 20 08/30/2019 01:00 PM    Triglyceride 100 08/30/2019 01:00 PM    CHOL/HDL Ratio 2.7 08/30/2019 01:00 PM       Lab Results   Component Value Date/Time    Hemoglobin A1c 5.8 12/09/2012 10:50 AM       Assessment/Plan:   1. Anemia: Etiology is unknown.  ???Referral to Hematology    ORDERS:  - REFERRAL TO HEMATOLOGY    2. CHF: Stable.  ???Continue with medication per her transplant team.  ???Continue to follow-up with them as well as the Cardiology team.    3. Depression/Anxiety:  ???I encouraged her to pray in accordance with her underlying belief system for stress reduction.  ???We will discontinue her Abilify.  I instructed her to notify me if her symptoms worsen off of this medication.  ???I will follow with her in 4 weeks to assess her response.  If she is doing well, we will try to de-escalate her Cymbalta.    4.  Thyroid nodule: TFTs are normal.  ???The patient was given the information to schedule her thyroid ultrasound.    5.  Cavitary Lung  Lesion: Clinically doing better.  ???I encouraged her to follow-up with her pulmonologist  ???Continue with oxygen therapy per their recommendations.    6. OSA: Untreated.  ???I encouraged her to contact her pulmonologist for CPAP supplies.  Per review of the [facility], it seems that they sent the order to a DME company.  They will need to follow-up with the DME company to ensure that she gets her machine.    7.  Osteoporosis: Unchanged per her recent DEXA scan.  ???I encouraged the patient to take vitamin D.  ???She was given the contact information to Rheumatology and encouraged to schedule an appointment to discuss treatment options.      Health Maintenance Due   Topic Date Due   ???  Shingrix Vaccine Age 26> (1 of 2) Never done     Lab review: labs are reviewed in the EHR    I have discussed the diagnosis with the patient and the intended plan as seen in the above orders.  The patient has received an after-visit summary and questions were answered concerning future plans.  I have discussed medication side effects and warnings with the patient as well. I have reviewed the plan of care with the patient, accepted their input and they are in agreement with the treatment goals. All questions were answered. The patient understands the plan of care. Handouts provided today with above information. Pt instructed if symptoms worsen to call the office or report to the ED for continued care.  Greater than 50% of the visit time was spent in counseling and/or coordination of care.      Voice recognition was used to generate this report, which may have resulted in some phonetic based errors in grammar and contents. Even though attempts were made to correct all the mistakes, some may have been missed, and remained in the body of the document.          Leo Rod, MD

## 2019-10-06 NOTE — Progress Notes (Signed)
 Diella Gillingham presents today for   Chief Complaint   Patient presents with   . Follow-up     6 wk f/u    . Hypertension              Coordination of Care:  1. Have you been to the ER, urgent care clinic since your last visit?   Hospitalized since your last visit? NO    2. Have you seen or consulted any other health care providers outside of the [facility] since your last visit?Include any pap smears or colon screening. YES

## 2019-10-10 NOTE — Telephone Encounter (Signed)
Tried calling patient again, per DR.Mason she will need to go to the ER for evaluation.

## 2019-10-10 NOTE — Telephone Encounter (Signed)
LMTRC

## 2019-10-10 NOTE — Telephone Encounter (Signed)
Patient is complaining of a cough, sore throat and chest pain. No fever or SOB. I scheduled a virtual for thurs. Wants to know if she can be seen sooner.

## 2019-10-10 NOTE — Telephone Encounter (Signed)
Please have her go to the ED for urgent evaluation.    Dr. Marius Ditch  [facility], [place] [address]  Phone: [phone]  Fax: [phone]

## 2019-10-12 LAB — HEMOGLOBIN A1C
Estimated Avg Glucose, External: 115 mg/dL (ref 91–123)
Hemoglobin A1C, External: 5.6 % (ref 4.8–5.6)

## 2019-10-13 ENCOUNTER — Ambulatory Visit: Payer: MEDICARE | Attending: Family Medicine | Primary: Family Medicine

## 2019-11-03 ENCOUNTER — Ambulatory Visit: Payer: MEDICARE | Attending: Family Medicine | Primary: Family Medicine

## 2020-01-17 NOTE — Progress Notes (Signed)
To re-sign the orders:    1. Click the Open Plan button.    2. Navigate to the therapy plan section's Edit Plan tab.    3. Click the Re-sign Orders button in the expired banner to re-sign all the expired and signed orders or select and re-sigkn the orders

## 2020-01-30 ENCOUNTER — Encounter

## 2020-01-30 ENCOUNTER — Ambulatory Visit: Admit: 2020-01-30 | Discharge: 2020-01-30 | Payer: MEDICARE | Attending: Family Medicine | Primary: Family Medicine

## 2020-01-30 ENCOUNTER — Ambulatory Visit: Attending: Family Medicine | Primary: Family Medicine

## 2020-01-30 DIAGNOSIS — Z23 Encounter for immunization: Secondary | ICD-10-CM

## 2020-01-30 DIAGNOSIS — R448 Other symptoms and signs involving general sensations and perceptions: Secondary | ICD-10-CM

## 2020-01-30 NOTE — Progress Notes (Signed)
[facility]:  01/30/2020, MRN: [id number]      Cathy [name] is a 69 y.o. female and presents to clinic for Follow-up      Subjective:   The patient is a 69 year old female with history of hypertension, chronic systolic heart failure??(followed by Dr.McCray) with EF of 10%, mitral regurgitation, cholelithiasis, multinodular goiter, obstructive sleep apnea??(not on rx), allergic rhinitis, osteoporosis, uterine fibroids per EHR, insomnia, cataracts,??dysphagia from achalasia?,??LVAD placement in 2019,??AICD placement,??S/p heart transplant (2020, followed by [facility]),??seizure,??left distal radial artery occlusion (2020),??left Baker's cyst, Covid-19 (2021), hyperlipidemia, and meningioma status post resection.    1.  Anemia: She was referred to Hematology at her last appointment. Today she reports: she has yet to schedule with Dr.Damle.     2.  Depression/Anxiety: Over the past year, patient was hospitalized for complications related to her cardiac transplant surgery.  She was started on Abilify, Cymbalta, and ritalin for severe depression symptoms.  She identifies as [place] and finds strength with prayer.  She is married.  Her family is very overly protective of her, per what she told me at her last appointment, due to her multiple medical issues.  She has also required assistance from her children with doing chores around the home due to her underlying cardiac history.  At her last appointment, she mentioned, wanting to transition off of the depression/anxiety medications that were started within the past year, during hospitalization.  Abilify was thus discontinued.  Today, she reports: she stopped taking abilify; she states that her mentation and coordination improved.  She stopped taking all of her rx (cymbalta and ritalin) since her last apt. No SI. She has no worsening of her anxiety/depression sx. Sx are stable per her hx.     3.  Thyroid Nodule History: The patient was encouraged to  schedule her thyroid ultrasound at her last appointment. She has yet to schedule this.     4. OSA: At her last appointment, she was not using CPAP.  She was in need of new CPAP supplies.  Today she reports: she has her CPAP supplies. She is using CPAP but it is uncomfortable.     5.  CHF/HTN: Blood pressure is 116/60.  She is on tacrolimus and Lipitor.  She was admitted in August after having chest pain and cough symptoms.  An echocardiogram was obtained to did not show any new findings.  Additionally, the patient had a catheterization (heart).  There was no obstruction.  Coronary arteries were normal.  An endomyocardial biopsy was obtained. Results are unknown. She was taken off of diuretic rx (lasix and aldactone). BP is stable. Sx have returned to her baseline.    11/15/19 Echocardiogram: Heart replaced by transplant.  Left ventricular chamber volume is normal.  Left ventricular systolic function is normal with an ejection fraction of  58 % by Sprick's biplane. There is mild concentric left ventricular hypertrophy.  Left ventricular diastolic function: normal. Right ventricular chamber size and systolic function are borderline  normal. TAPSE 1.3 cm, TAPSV 8.92 cm/s, FAC is 38%.  No significant valve abnormalities. Unable to estimate pulmonary arterial pressure due to inadequate tricuspid  regurgitant Doppler waveforms.  There is pleural effusion present. Comparison: Compared to prior study from 10/21/19: ??Previous mildly reduced RV  function with FAC of 27 %.     10/21/19 Echocardiogram: Left ventricular chamber size is normal.  Left ventricular wall thickness is mildly increased.  Left ventricular systolic function is normal with an ejection fraction of  55 % by  visual estimation.  Right ventricular chamber dimension is normal.  Right ventricular systolic function is mildly reduced with TAPSE measuring  1.24 cm.  Right ventricular fractional area change is 27 %. Estimated pulmonary arterial systolic pressure is  22 mmHg. Comparison: Compared to prior study from 10/11/2019: No change in LV or RV function.  Previous exam utilized definity contrast to rule out RV apical clot.     08/15/19 Echocardiogram:??History of cardiac transplant. ??Left ventricular systolic function is normal with an ejection fraction of ??55 % by visual estimation. ??Left ventricular diastolic function: indeterminate. ??Left ventricular chamber volume is decreased. ??There is asymmetrical left ventricular hypertrophy with a mildly thickened ??posterior wall. ??Right ventricular systolic function is normal with TAPSE measuring 1.38 cm. ??Right ventricular chamber dimension is normal. ??No pulmonary hypertension, estimated pulmonary arterial systolic pressure ??is 30 mmHg. ??Compared to prior study from 04/28/2019: EF 65%, Mildly reduced RV ??function, PAP 45-57 mmHg.     6.  Right Cavitary Lung Lesion and Covid Hx: Today she reports: She developed COVID-19 and was hospitalized in September.  She is on 0L of oxygen. She transitioned off of oxygen per her Cardiology team's recommendations.  S/p 2 doses of the Covid 19 vaccine. She can smell/taste now since her infection but has a poor appetite. She still has numbness along her entire tongue. No issues with chewing.     11/15/19 Chest CT: Bilateral groundglass lung opacities consistent with the history of Covid-19 pneumonia.  Further decreased size of postinflammatory fibrosis in the peripheral right lung.  Cardiac transplant.  Stable calcified left thyroid nodules.  Other stable chronic findings as above.     07/15/19 Chest CT:??Persistent right lung consolidative changes with cavitation but overall interval decrease of amount of consolidation in comparison to last CT 04/25/2019. Trace right pleural effusion. ??Stable exophytic lesion in the left kidney similar to prior studies.   ??  03/16/19 Bone scan:??Abnormal white cell localization in the posterior right lung corresponding with known cavitary lung consolidation. Finding  compatible with active lung infection. White cell localization would be expected in both abscess and mycetoma. ??False positives from tumor or associated hemorrhage also in differential. Ultimately tissue sampling may be needed for differentiation. Subtle focal activity upper midline chest on the frontal view only. This is in the region of the manubrium. Patient reportedly has bandaging in this area, suggesting known underlying inflammation/infection and this may be cause of the white cell localization. Correlate clinically.??    Patient Active Problem List    Diagnosis Date Noted   ??? Depression 08/30/2019   ??? History of seizures 08/30/2019   ??? Mild persistent asthma without complication 78/29/5621   ??? Cavitary lung disease 08/30/2019   ??? Anemia 08/30/2019   ??? Thyroid nodule 08/30/2019   ??? Esophageal dysphagia 03/17/2017   ??? Age-related osteoporosis without current pathological fracture 03/04/2016   ??? Multinodular goiter 03/04/2016   ??? History of meningioma 03/04/2016   ??? Non-rheumatic mitral regurgitation 04/25/2014   ??? AR (allergic rhinitis) 09/29/2011   ??? Chronic systolic heart failure ([place])    ??? Mixed hyperlipidemia    ??? AICD (automatic cardioverter/defibrillator) present 11/15/2010   ??? Cataract 12/01/2008   ??? Gallstones 11/13/2008   ??? Insomnia 11/13/2008   ??? OSA (obstructive sleep apnea) 11/13/2008   ??? Fibroid 06/23/2008   ??? HTN (hypertension) 06/23/2008       Current Outpatient Medications   Medication Sig Dispense Refill   ??? fluticasone furoate-vilanteroL (Breo Ellipta) 200-25 mcg/dose inhaler Take 1 Puff by  inhalation daily.     ??? acetaminophen (TylenoL) 325 mg tablet Take  by mouth every four (4) hours as needed for Pain.     ??? tacrolimus (PROGRAF) 1 mg capsule Take 8 mg every morning, take 7 mg every evening     ??? ondansetron (ZOFRAN ODT) 8 mg disintegrating tablet Take 8 mg by mouth.     ??? melatonin 3 mg tablet Take 3 mg by mouth.     ??? loratadine (CLARITIN) 10 mg tablet TAKE ONE TABLET BY MOUTH DAILY     ???  lidocaine 4 % patch 1 Patch by TransDERmal route daily.     ??? Flovent HFA 220 mcg/actuation inhaler      ??? fluticasone propionate (FLONASE) 50 mcg/actuation nasal spray 1 Spray daily.     ??? docusate sodium (COLACE) 100 mg capsule Take 100 mg by mouth daily.     ??? diclofenac (VOLTAREN) 1 % gel Apply 2 g to affected area four (4) times daily.     ??? atorvastatin (LIPITOR) 40 mg tablet Take 40 mg by mouth daily.     ??? polyethylene glycol (MIRALAX) 17 gram/dose powder Take 17 g by mouth daily. (Patient not taking: Reported on 01/30/2020)     ??? omeprazole (PRILOSEC) 20 mg capsule  (Patient not taking: Reported on 01/30/2020)     ??? methylphenidate HCl (RITALIN) 5 mg tablet Take 5 mg by mouth. (Patient not taking: Reported on 01/30/2020)     ??? levETIRAcetam (KEPPRA) 500 mg tablet Take 500 mg by mouth nightly. (Patient not taking: Reported on 01/30/2020)     ??? furosemide (LASIX) 20 mg tablet  (Patient not taking: Reported on 01/30/2020)     ??? DULoxetine (CYMBALTA) 30 mg capsule Take 30 mg by mouth daily. (Patient not taking: Reported on 01/30/2020)     ??? spironolactone (ALDACTONE) 25 mg tablet TAKE ONE TABLET BY MOUTH DAILY (Patient not taking: Reported on 01/30/2020) 90 Tab 1       Allergies   Allergen Reactions   ??? Penicillamine Itching   ??? Penicillins Rash and Itching     Itching bumps on hands and arms  Itching bumps on hands and arms  Itching bumps on hands and arms  Has patient had a PCN reaction causing immediate rash, facial/tongue/throat swelling, SOB or lightheadedness with hypotension: Yes  Has patient had a PCN reaction causing severe rash involving mucus membranes or skin necrosis: No  Has patient had a PCN reaction that required hospitalization unknown  Has patient had a PCN reaction occurring within the last 10 years: No  If all of the above answers are "NO", then may proceed with Cephalosporin use.    Itching bumps on hands and arms   ??? Tramadol Other (comments)     Psychotic Hallucinations  Psychotic  Hallucinations  hallucinations  Psychotic Hallucinations   ??? Chlorhexidine Towelette Itching     CHG Wipes   ??? Copper Itching       Past Medical History:   Diagnosis Date   ??? Age related osteoporosis    ??? Automatic implantable cardiac defibrillator in situ     Post ddd icd Set up carelink   ??? Baker cyst, left 12/2016   ??? Breast mass, left     benign   ??? CHF (congestive heart failure) ([place]) 06/23/2008    Non-ischemic CMP , Cath (2008) Normal coronary arteries   ??? Chronic systolic heart failure (H[facility])     Stable,    ??? Degenerative arthritis of  left knee    ??? Fibroid 06/23/2008   ??? History of brain tumor 2013   ??? HLD (hyperlipidemia)    ??? HTN (hypertension) 06/23/2008   ??? Hypotension, unspecified     Related to lisinopril   ??? 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 Left knee pain     With swelling   ??? Leg pain    ??? LVAD (left ventricular assist device) present ([place]) 03/2017   ??? 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 OSA (obstructive sleep apnea) 9/10   ??? Venous reflux    ??? Wears glasses        Past Surgical History:   Procedure Laterality Date   ??? FULL ESOPHAGEAL MANOMETRY  02/16/2017        ??? HX BREAST BIOPSY      rt breast, benign   ??? HX CHOLECYSTECTOMY     ??? HX CRANIOTOMY  2013    meningioma    ??? HX HEENT      glasses   ??? HX HYSTERECTOMY     ??? HX IMPLANTABLE CARDIOVERTER DEFIBRILLATOR     ??? HX PACEMAKER  2013    dual chamber icd   ??? HX TONSIL AND ADENOIDECTOMY     ??? HX TOTAL ABDOMINAL HYSTERECTOMY  1996   ??? HX TUBAL LIGATION         Family History   Problem Relation Age of Onset   ??? Diabetes Mother    ??? Heart Attack Father         MI   ??? Heart Disease Maternal Grandmother    ??? Heart Disease Paternal Grandmother    ??? Kidney Disease Other         1 sib deceased   ??? Cancer Other         1 sib throat cancer   ??? Diabetes Other    ??? Diabetes Daughter    ??? Other Daughter         leukemia   ??? Hypertension Other        Social History     Tobacco Use   ??? Smoking  status: Never Smoker   ??? Smokeless tobacco: Never Used   Substance Use Topics   ??? Alcohol use: No       ROS   Review of Systems   Constitutional: Negative for chills and fever.   HENT: Negative for ear pain and sore throat.    Eyes: Negative for blurred vision and pain.   Respiratory: Positive for shortness of breath (improved since her 2 hospitalizations). Negative for cough.    Cardiovascular: Negative for chest pain.   Gastrointestinal: Negative for abdominal pain, blood in stool and melena.   Genitourinary: Negative for dysuria and hematuria.   Musculoskeletal: Positive for joint pain (left knee pain along the popliteal fossa). Negative for myalgias.   Skin: Negative for rash.   Neurological: Negative for headaches.   Endo/Heme/Allergies: Does not bruise/bleed easily.   Psychiatric/Behavioral: Negative for substance abuse.       Objective     Vitals:    01/30/20 1117   BP: 116/60   Pulse: 83   Resp: 16   Temp: 98.2 ??F (36.8 ??C)   TempSrc: Temporal   SpO2: 99%   Weight: 140 lb (63.5 kg)   Height: 5\' 10"  (1.778 m)   PainSc:   5   PainLoc: Leg  Physical Exam  Vitals and nursing note reviewed.   HENT:      Head: Normocephalic and atraumatic.      Right Ear: External ear normal.      Left Ear: External ear normal.   Eyes:      General: No scleral icterus.        Right eye: No discharge.         Left eye: No discharge.      Conjunctiva/sclera: Conjunctivae normal.   Cardiovascular:      Rate and Rhythm: Normal rate and regular rhythm.      Heart sounds: Normal heart sounds. No murmur heard.  No friction rub. No gallop.    Pulmonary:      Effort: Pulmonary effort is normal. No respiratory distress.      Breath sounds: Normal breath sounds. No wheezing or rales.   Abdominal:      General: Bowel sounds are normal. There is no distension.      Palpations: Abdomen is soft. There is no mass.      Tenderness: There is no abdominal tenderness. There is no guarding or rebound.   Musculoskeletal:         General: No  swelling (BUE) or tenderness (BUE).      Cervical back: Neck supple.   Lymphadenopathy:      Cervical: No cervical adenopathy.   Skin:     General: Skin is warm and dry.      Findings: No erythema or rash.   Neurological:      Mental Status: She is alert.      Motor: No abnormal muscle tone.      Gait: Gait normal.   Psychiatric:         Mood and Affect: Mood normal.         LABS   Data Review:   Lab Results   Component Value Date/Time    WBC 6.1 08/30/2019 01:00 PM    Hemoglobin, POC 11.6 (L) 01/28/2018 02:24 PM    HGB 8.6 (L) 08/30/2019 01:00 PM    Hematocrit, POC 34 (L) 01/28/2018 02:24 PM    HCT 30.3 (L) 08/30/2019 01:00 PM    PLATELET 212 08/30/2019 01:00 PM    MCV 88.9 08/30/2019 01:00 PM       Lab Results   Component Value Date/Time    Sodium 140 08/30/2019 01:00 PM    Potassium 5.3 08/30/2019 01:00 PM    Chloride 107 08/30/2019 01:00 PM    CO2 28 08/30/2019 01:00 PM    Anion gap 5 08/30/2019 01:00 PM    Glucose 98 08/30/2019 01:00 PM    BUN 28 (H) 08/30/2019 01:00 PM    Creatinine 1.16 08/30/2019 01:00 PM    BUN/Creatinine ratio 24 (H) 08/30/2019 01:00 PM    GFR est AA 56 (L) 08/30/2019 01:00 PM    GFR est non-AA 46 (L) 08/30/2019 01:00 PM    Calcium 9.2 08/30/2019 01:00 PM       Lab Results   Component Value Date/Time    Cholesterol, total 170 08/30/2019 01:00 PM    HDL Cholesterol 64 (H) 08/30/2019 01:00 PM    LDL, calculated 86 08/30/2019 01:00 PM    VLDL, calculated 20 08/30/2019 01:00 PM    Triglyceride 100 08/30/2019 01:00 PM    CHOL/HDL Ratio 2.7 08/30/2019 01:00 PM       Lab Results   Component Value Date/Time    Hemoglobin A1c 5.8 12/09/2012 10:50 AM  Assessment/Plan:   1. Health Maintenance:  - The flu vaccine was given    ORDERS:  - FLU (FLUAD QUAD INFLUENZA VACCINE,QUAD,ADJUVANTED)    2. Paresthesia of tongue: Present since her Covid-19 infection.  - Ordering a brain MRI  - Referral to Neurology    ORDERS:  - MRI BRAIN W WO CONT; Future  - REFERRAL TO NEUROLOGY    3. H/o Cavitary Lung Lesion  and Covid-19.  - I encouraged her to follow-up with her pulmonologist.  ???Continue with inhaler therapy as prescribed.    4. CHF: Stable.  ???Continue to follow-up with the cardiac transplant team.    ???Continue with medication as prescribed.    5.  OSA: Stable.  ???I encouraged her to continue use of her CPAP.    6. Depression/Anxiety: Improved without medication.  Observation.    7. Anemia: Etiology is unknown.  ???She is given contact information to Hematoogy and encouraged to schedule an appointment.    8. Thyroid Nodule Hx:   ???I encouraged her to schedule her ultrasound      Health Maintenance Due   Topic Date Due   ??? Shingrix Vaccine Age 59> (1 of 2) Never done   ??? COVID-19 Vaccine (3 - Pfizer risk 4-dose series) 06/09/2019   ??? Flu Vaccine (1) 10/26/2019         Lab review: labs are reviewed in our EHR and in the [facility]    I have discussed the diagnosis with the patient and the intended plan as seen in the above orders.  The patient has received an after-visit summary and questions were answered concerning future plans.  I have discussed medication side effects and warnings with the patient as well. I have reviewed the plan of care with the patient, accepted their input and they are in agreement with the treatment goals. All questions were answered. The patient understands the plan of care. Handouts provided today with above information. Pt instructed if symptoms worsen to call the office or report to the ED for continued care.  Greater than 50% of the visit time was spent in counseling and/or coordination of care.      Voice recognition was used to generate this report, which may have resulted in some phonetic based errors in grammar and contents. Even though attempts were made to correct all the mistakes, some may have been missed, and remained in the body of the document.          Leo Rod, MD

## 2020-01-30 NOTE — Progress Notes (Signed)
 Cathy [name] presents today for   Chief Complaint   Patient presents with   . Follow-up          1. Have you been to the ER, urgent care clinic since your last visit?  Hospitalized since your last visit? yes    2. Have you seen or consulted any other health care providers outside of the [facility] since your last visit? yes    3. For patients aged 69-75: Has the patient had a colonoscopy? yes      4. For patients aged 7-74: Has the patient had a mammogram within the past 2 years? yes    Cathy [name] 1950-10-05 female who presents for routine immunizations.   Patient denies any symptoms , reactions or allergies that would exclude them from being immunized today.  Risks and adverse reactions were discussed and the VIS was given to them. All questions were addressed.  Order placed for hd flu vaccine,  per Verbal Order from dr.mason with read back.  Patient was observed for 15 min post injection. There were no reactions observed.    Harlene DELENA Bob, LPN

## 2020-01-30 NOTE — Progress Notes (Signed)
Please see if she can do a virtual visit with me at 2:30 today to discuss her MRI results.    Dr. Marius Ditch  [facility], [place] [address]  Phone: [phone]  Fax: [phone]

## 2020-02-09 ENCOUNTER — Inpatient Hospital Stay: Admit: 2020-02-09 | Payer: MEDICARE | Attending: Family Medicine | Primary: Family Medicine

## 2020-02-09 DIAGNOSIS — R448 Other symptoms and signs involving general sensations and perceptions: Secondary | ICD-10-CM

## 2020-02-09 LAB — CREATININE, POC
Creatinine, POC: 1.3 MG/DL (ref 0.6–1.3)
GFRAA, POC: 49 mL/min/{1.73_m2} — ABNORMAL LOW (ref >60)
GFRNA, POC: 41 mL/min/{1.73_m2} — ABNORMAL LOW (ref >60)

## 2020-02-09 LAB — AMB POC CREATININE
Creatinine, POC: 1.3 MG/DL (ref 0.6–1.3)
GFR African American: 49 mL/min/{1.73_m2} — ABNORMAL LOW (ref >60)
GFR Non-African American: 41 mL/min/{1.73_m2} — ABNORMAL LOW (ref >60)

## 2020-02-09 MED ORDER — GADOTERATE MEGLUMINE 0.5 MMOL/ML IV SYRINGE
0.5 mmol/mL | Freq: Once | INTRAVENOUS | Status: AC
Start: 2020-02-09 — End: 2020-02-09
  Administered 2020-02-09: 19:00:00 via INTRAVENOUS

## 2020-02-09 MED FILL — DOTAREM 0.5 MMOL/ML INTRAVENOUS SYRINGE: 0.5 mmol/mL | INTRAVENOUS | Qty: 15

## 2020-02-10 ENCOUNTER — Encounter

## 2020-02-10 MED ORDER — SODIUM CHLORIDE 0.9 % INJECTION
202 mg/2 mL | INTRAMUSCULAR | Status: DC | PRN
Start: 2020-02-10 — End: 2020-02-10

## 2020-02-10 MED ORDER — DIPHENHYDRAMINE HCL 50 MG/ML IJ SOLN
50 mg/mL | INTRAMUSCULAR | Status: DC | PRN
Start: 2020-02-10 — End: 2020-02-10

## 2020-02-10 MED ORDER — ALBUTEROL SULFATE 0.083 % (0.83 MG/ML) SOLN FOR INHALATION
2.5 mg /3 mL (0.083 %) | RESPIRATORY_TRACT | Status: DC | PRN
Start: 2020-02-10 — End: 2020-02-10

## 2020-02-10 MED ORDER — HYDROCORTISONE SOD SUCCINATE (PF) 100 MG/2 ML SOLUTION FOR INJECTION
100 mg/2 mL | INTRAMUSCULAR | Status: DC | PRN
Start: 2020-02-10 — End: 2020-02-10

## 2020-02-10 MED ORDER — SODIUM CHLORIDE 0.9% BOLUS IV
0.9 % | INTRAVENOUS | Status: DC | PRN
Start: 2020-02-10 — End: 2020-02-10

## 2020-02-10 MED ORDER — ONDANSETRON (PF) 4 MG/2 ML INJECTION
4 mg/2 mL | INTRAMUSCULAR | Status: DC | PRN
Start: 2020-02-10 — End: 2020-02-10

## 2020-02-10 MED ORDER — ACETAMINOPHEN 325 MG TABLET
325 mg | ORAL | Status: DC | PRN
Start: 2020-02-10 — End: 2020-02-10

## 2020-02-10 MED ORDER — EPINEPHRINE (PF) 1 MG/ML INJECTION
1 mg/mL ( mL) | INTRAMUSCULAR | Status: DC | PRN
Start: 2020-02-10 — End: 2020-02-10

## 2020-02-10 MED ORDER — MEPERIDINE (PF) 25 MG/ML INJ SOLUTION
25 mg/ml | INTRAMUSCULAR | Status: DC | PRN
Start: 2020-02-10 — End: 2020-02-10

## 2020-02-15 ENCOUNTER — Telehealth: Admit: 2020-02-15 | Discharge: 2020-02-15 | Payer: MEDICARE | Attending: Family Medicine | Primary: Family Medicine

## 2020-02-15 ENCOUNTER — Telehealth: Attending: Family Medicine | Primary: Family Medicine

## 2020-02-15 DIAGNOSIS — R9089 Other abnormal findings on diagnostic imaging of central nervous system: Secondary | ICD-10-CM

## 2020-02-15 NOTE — Progress Notes (Signed)
Cathy [name] is a 69 y.o. female who was seen by synchronous (real-time) audio phone only technology on 02/15/2020.        Assessment & Plan:   Abnormal Brain MRI: +Paresthesia of tongue - with etiology unknown.  - Referral to Neurology    ORDERS:  - REFERRAL TO NEUROLOGY      Lab review: labs are reviewed in the EHR     I have discussed the diagnosis with the patient and the intended plan as seen in the above orders. I have discussed medication side effects and warnings with the patient as well. I have reviewed the plan of care with the patient, accepted their input and they are in agreement with the treatment goals. All questions were answered. The patient understands the plan of care. Pt instructed if symptoms worsen to call the office or report to the ED for continued care.  Greater than 50% of the visit time was spent in counseling and/or coordination of care. I spent 13 min with the pt for this phone only encounter.     Voice recognition was used to generate this report, which may have resulted in some phonetic based errors in grammar and contents. Even though attempts were made to correct all the mistakes, some may have been missed, and remained in the body of the document.      Subjective:   Cathy [name] was seen for   Chief Complaint   Patient presents with   ??? Abnormal MRI     The patient is a 69 year old female with history of hypertension, chronic systolic heart failure??(followed by Dr.McCray) with EF of 10%, mitral regurgitation, cholelithiasis, multinodular goiter, obstructive sleep apnea??(not on rx), allergic rhinitis, osteoporosis, uterine fibroids per EHR, insomnia, cataracts,??dysphagia from achalasia?,??LVAD placement in 2019,??AICD placement,??S/p heart transplant (2020, followed by [facility]),??seizure,??left distal radial artery occlusion (2020),??left Baker's cyst, Covid-19 (2021), hyperlipidemia, and meningioma status post resection.    Tongue Paresthesias: Reported at her last  appointment.  Present since she had COVID-19.  No headaches.  No vision or hearing changes.  No weakness.  Symptoms are unchanged.  At her last appointment, I ordered an MRI of her brain.  Findings are listed below.  Note that she has a history of having a meningioma removed.    02/09/20 Head MRI:   No acute intracranial hemorrhage or territorial infarct. Chronically present U-shaped area of abnormal brain parenchyma at the right frontoparietal junction. No enhancement Etiologic considerations include cortical dysplasia, low-grade glioma, or multinodular and vacuolating neuronal tumor (MVNT). Ideally corroborated with prior outside MRI if such exists Previous resection of large right frontal lobe meningioma, that region stable in appearance as well. Extensive remote microhemorrhage. This can relate to hypertensive sequelae, perhaps intermixed with amyloid angiopathy.      Prior to Admission medications    Medication Sig Start Date End Date Taking? Authorizing Provider   fluticasone furoate-vilanteroL (Breo Ellipta) 200-25 mcg/dose inhaler Take 1 Puff by inhalation daily. 12/15/19   Provider, Historical   acetaminophen (TylenoL) 325 mg tablet Take  by mouth every four (4) hours as needed for Pain.    Provider, Historical   tacrolimus (PROGRAF) 1 mg capsule Take 8 mg every morning, take 7 mg every evening 08/15/19   Provider, Historical   ondansetron (ZOFRAN ODT) 8 mg disintegrating tablet Take 8 mg by mouth. 07/11/19   Provider, Historical   melatonin 3 mg tablet Take 3 mg by mouth. 06/15/19   Provider, Historical   loratadine (CLARITIN) 10 mg tablet  TAKE ONE TABLET BY MOUTH DAILY 07/11/19   Provider, Historical   lidocaine 4 % patch 1 Patch by TransDERmal route daily. 06/16/19   Provider, Historical   Flovent HFA 220 mcg/actuation inhaler  08/02/19   Provider, Historical   fluticasone propionate (FLONASE) 50 mcg/actuation nasal spray 1 Spray daily. 06/15/19   Provider, Historical   docusate sodium (COLACE) 100 mg capsule  Take 100 mg by mouth daily. 07/11/19   Provider, Historical   diclofenac (VOLTAREN) 1 % gel Apply 2 g to affected area four (4) times daily. 06/15/19   Provider, Historical   atorvastatin (LIPITOR) 40 mg tablet Take 40 mg by mouth daily. 04/30/17   Provider, Historical     Allergies   Allergen Reactions   ??? Penicillamine Itching   ??? Penicillins Rash and Itching     Itching bumps on hands and arms  Itching bumps on hands and arms  Itching bumps on hands and arms  Has patient had a PCN reaction causing immediate rash, facial/tongue/throat swelling, SOB or lightheadedness with hypotension: Yes  Has patient had a PCN reaction causing severe rash involving mucus membranes or skin necrosis: No  Has patient had a PCN reaction that required hospitalization unknown  Has patient had a PCN reaction occurring within the last 10 years: No  If all of the above answers are "NO", then may proceed with Cephalosporin use.    Itching bumps on hands and arms   ??? Tramadol Other (comments)     Psychotic Hallucinations  Psychotic Hallucinations  hallucinations  Psychotic Hallucinations   ??? Chlorhexidine Towelette Itching     CHG Wipes   ??? Copper Itching     Past Medical History:   Diagnosis Date   ??? Age related osteoporosis    ??? Automatic implantable cardiac defibrillator in situ     Post ddd icd Set up carelink   ??? Baker cyst, left 12/2016   ??? Breast mass, left     benign   ??? CHF (congestive heart failure) ([place]) 06/23/2008    Non-ischemic CMP , Cath (2008) Normal coronary arteries   ??? Chronic systolic heart failure ([facility])     Stable,    ??? Degenerative arthritis of left knee    ??? Fibroid 06/23/2008   ??? History of brain tumor 2013   ??? HLD (hyperlipidemia)    ??? HTN (hypertension) 06/23/2008   ??? Hypotension, unspecified     Related to lisinopril   ??? 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 Left knee pain     With swelling   ??? Leg pain    ??? LVAD (left ventricular assist device) present ([place]) 03/2017   ??? Mitral  valve disorders(424.0) 04/11/2013    mod to severe mr    ??? OSA (obstructive sleep apnea) 9/10   ??? Venous reflux    ??? Wears glasses      Past Surgical History:   Procedure Laterality Date   ??? FULL ESOPHAGEAL MANOMETRY  02/16/2017        ??? HX BREAST BIOPSY      rt breast, benign   ??? HX CHOLECYSTECTOMY     ??? HX CRANIOTOMY  2013    meningioma    ??? HX HEENT      glasses   ??? HX HYSTERECTOMY     ??? HX IMPLANTABLE CARDIOVERTER DEFIBRILLATOR     ??? HX PACEMAKER  2013    dual chamber icd   ???  HX TONSIL AND ADENOIDECTOMY     ??? HX TOTAL ABDOMINAL HYSTERECTOMY  1996   ??? HX TUBAL LIGATION       Family History   Problem Relation Age of Onset   ??? Diabetes Mother    ??? Heart Attack Father         MI   ??? Heart Disease Maternal Grandmother    ??? Heart Disease Paternal Grandmother    ??? Kidney Disease Other         1 sib deceased   ??? Cancer Other         1 sib throat cancer   ??? Diabetes Other    ??? Diabetes Daughter    ??? Other Daughter         leukemia   ??? Hypertension Other      Social History     Socioeconomic History   ??? Marital status: MARRIED   Occupational History   ??? Occupation: home health aide   Tobacco Use   ??? Smoking status: Never Smoker   ??? Smokeless tobacco: Never Used   Substance and Sexual Activity   ??? Alcohol use: No   ??? Drug use: No   ??? Sexual activity: Yes     Partners: Male     Birth control/protection: Condom       ROS:  Gen: No fever/chills  HEENT: No sore throat, eye pain, ear pain, or congestion. No HA  CV: No CP  Resp: No cough/SOB  GI: No abdominal pain  GU: No hematuria/dysuria  Derm: No rash  Neuro: No new paresthesias/weakness. +Persistent tongue paresthesias  Musc: No new myalgias/jt pain  Psych: No depression sx        LABS:  Lab Results   Component Value Date/Time    Sodium 140 08/30/2019 01:00 PM    Potassium 5.3 08/30/2019 01:00 PM    Chloride 107 08/30/2019 01:00 PM    CO2 28 08/30/2019 01:00 PM    Anion gap 5 08/30/2019 01:00 PM    Glucose 98 08/30/2019 01:00 PM    BUN 28 (H) 08/30/2019 01:00 PM    Creatinine  1.16 08/30/2019 01:00 PM    BUN/Creatinine ratio 24 (H) 08/30/2019 01:00 PM    GFR est AA 56 (L) 08/30/2019 01:00 PM    GFR est non-AA 46 (L) 08/30/2019 01:00 PM    Calcium 9.2 08/30/2019 01:00 PM       Lab Results   Component Value Date/Time    Cholesterol, total 170 08/30/2019 01:00 PM    HDL Cholesterol 64 (H) 08/30/2019 01:00 PM    LDL, calculated 86 08/30/2019 01:00 PM    VLDL, calculated 20 08/30/2019 01:00 PM    Triglyceride 100 08/30/2019 01:00 PM    CHOL/HDL Ratio 2.7 08/30/2019 01:00 PM       Lab Results   Component Value Date/Time    WBC 6.1 08/30/2019 01:00 PM    Hemoglobin, POC 11.6 (L) 01/28/2018 02:24 PM    HGB 8.6 (L) 08/30/2019 01:00 PM    Hematocrit, POC 34 (L) 01/28/2018 02:24 PM    HCT 30.3 (L) 08/30/2019 01:00 PM    PLATELET 212 08/30/2019 01:00 PM    MCV 88.9 08/30/2019 01:00 PM       Lab Results   Component Value Date/Time    Hemoglobin A1c 5.8 12/09/2012 10:50 AM       Lab Results   Component Value Date/Time    TSH 2.54 08/30/2019 01:00 PM  Due to this being a TeleHealth phone only evaluation, many elements of the physical examination are unable to be assessed.     The pt was seen by synchronous (real-time) audio-phone only technology, and/or her healthcare decision maker, is aware that this patient-initiated, Telehealth encounter is a billable service, with coverage as determined by her insurance carrier. She is aware that she may receive a bill and has provided verbal consent to proceed: Yes.     The pt is being evaluated by a phone only visit encounter for concerns as above. A caregiver was present when appropriate. Due to this being a Scientist, physiological (During OEVOJ-50 public health emergency), evaluation of the following organ systems was limited: Vitals/Constitutional/EENT/Resp/CV/GI/GU/MS/Neuro/Skin/Heme-Lymph-Imm.   Pursuant to the emergency declaration under the [place] waiver authority and the [employer] and  [employer] Act, this Virtual phone only Visit was conducted, with patient's (and/or legal guardian's) consent, to reduce the patient's risk of exposure to COVID-19 and provide necessary medical care.     Services were provided through a phone only synchronous discussion virtually to substitute for in-person clinic visit. Patient and provider were located at their individual home/office respectively.      We discussed the expected course, resolution and complications of the diagnosis(es) in detail.  Medication risks, benefits, costs, interactions, and alternatives were discussed as indicated.  I advised her to contact the office if her condition worsens, changes or fails to improve as anticipated. She expressed understanding with the diagnosis(es) and plan.     Leo Rod, MD

## 2020-02-16 NOTE — Telephone Encounter (Signed)
-----   Message from Wonda Cheng sent at 02/16/2020  1:36 PM EST -----  Subject: Message to Provider    QUESTIONS  Information for Provider? Pt would like a call back in regards to her lab   results, please reach out to [phone].  ---------------------------------------------------------------------------  --------------  CALL BACK INFO  What is the best way for the office to contact you? OK to leave message on   voicemail  Preferred Call Back Phone Number? [phone]  ---------------------------------------------------------------------------  --------------  SCRIPT ANSWERS  Relationship to Patient? Self

## 2020-04-30 ENCOUNTER — Ambulatory Visit: Admit: 2020-04-30 | Discharge: 2020-04-30 | Payer: MEDICARE | Attending: Family Medicine | Primary: Family Medicine

## 2020-04-30 ENCOUNTER — Encounter

## 2020-04-30 ENCOUNTER — Ambulatory Visit: Attending: Family Medicine | Primary: Family Medicine

## 2020-04-30 DIAGNOSIS — E042 Nontoxic multinodular goiter: Secondary | ICD-10-CM

## 2020-04-30 NOTE — Progress Notes (Signed)
Please let her know that her thyroid ultrasound findings are consistent with thyroiditis or irritation to the thyroid gland.  There are no significant changes to her known left-sided thyroid nodules.  She has a new subcentimeter left thyroid nodule and a 1.1 cm nodule along the center of her gland.  No additional studies are warranted at this time for these findings.  I will check another thyroid ultrasound in 1 to 2 years.    Dr. Marius Ditch  [facility], [place] [address]  Phone: [phone]  Fax: [phone]

## 2020-04-30 NOTE — Progress Notes (Signed)
[facility] [place]:  04/30/2020, MRN: [id number]      Cathy [name] is a 70 y.o. female and presents to clinic for Follow-up and Cholesterol Problem      Subjective:   The patient is a 70 year old female with history of hypertension, chronic systolic heart failure??(followed by Dr.McCray) with EF of 10%, mitral regurgitation, cholelithiasis, asthma, multinodular goiter, obstructive sleep apnea??(not on rx), allergic rhinitis, osteoporosis, uterine fibroids per EHR, insomnia, cataracts,??dysphagia from achalasia?,??LVAD placement in 2019,??AICD placement,??S/p heart transplant (2020, followed by [facility]),??seizure,??left distal radial artery occlusion (2020),??left Baker's cyst,??Covid-19 (2021),??hyperlipidemia, and meningioma status post resection.    1. CHF: +Heart transplant recipient.  On Prograf. Today she reports: she is asymptomatic.  Blood pressure is 114/56.  Status post endomyocardial biopsy on March 1.  It is negative for any signs of organ rejection. Her next apt with her Cardiology team is in June.     2.  Hyperlipidemia: Taking Lipitor.  Today she reports: No adverse effects with this medication.    3. Asthma: Using Breo. Today she reports: no issues with her breathing.     4. Multinodular Goiter:  Last ultrasound was from 2019.     11/13/17 Thyroid Ultrasound: Persistent left thyroid lobe nodules.  The more cephalad nodule is entirely solid.  Although in the absence of the prior studies, this nodule would have been considered at [facility] 4-5 lesion, the overall long term stability favors a probably benign or low suspicion finding especially in light of benign FNA results from 04/09/16.  Recommend continued followup ultrasound in 1 year.  The more inferior nodule is of less degree of suspicion in light of cystic component centrally. TIRADS 3.      5. Abnormal MRI: At her last appointment, she reported tongue paresthesia history.  An MRI was obtained and findings are listed below.  And that she has  a history of a meningioma that was subsequently removed.  She was subsequently referred to a neurologist for evaluation of persistent tongue paresthesias.  Today she reports: she still has tongue paresthesias. It prevents her from having a good appetite. She is drinking Ensure to keep her weight up. She saw Neurology in between apts. She was placed on rx but does not know the name. She is scheduled later this month with Neurology.     02/09/20 Head MRI: ????No acute intracranial hemorrhage or territorial infarct. Chronically present U-shaped area of abnormal brain parenchyma at the right frontoparietal junction. No enhancement Etiologic considerations include cortical dysplasia, low-grade glioma, or multinodular and vacuolating neuronal tumor (MVNT). Ideally corroborated with prior outside MRI if such exists Previous resection of large right frontal lobe meningioma, that region stable in appearance as well. Extensive remote microhemorrhage. This can relate to hypertensive sequelae, perhaps intermixed with amyloid angiopathy.    6. Left Leg Nodule: Status post a left lower extremity ultrasound study.  Findings as above. Followed by Dr.Young. She is scheduled to see someone tomorrow for evaluation of the ultrasound study findings. She had an MRI of her left leg last month. I don't have records. She has pain along her LLE where she has a mass. She sees Dr. Peyton Najjar about her LLE study findings - tomorrow.    LLE ultrasound 04/13/20:  Solid hypoechoic nodule in the superficial soft tissues of the left lower leg at the area of interest. Imaging features are not specific. Features are not typical for lipoma or cyst and other solid soft tissue masses should be considered to include both benign and malignant  soft tissue neoplasms.     7. Anemia: Her most recent CBC in the [facility] EHR shows that her hemoglobin is in the 8 range.  It was previously in the 10 range.  She is not on a blood thinner.  Today she reports: she is taking  iron every day. Bleeding: none. She sees Dr.Damle later this month.        Patient Active Problem List    Diagnosis Date Noted   ??? Depression 08/30/2019   ??? History of seizures 08/30/2019   ??? Mild persistent asthma without complication 56/38/7564   ??? Cavitary lung disease 08/30/2019   ??? Anemia 08/30/2019   ??? Thyroid nodule 08/30/2019   ??? Esophageal dysphagia 03/17/2017   ??? Age-related osteoporosis without current pathological fracture 03/04/2016   ??? Multinodular goiter 03/04/2016   ??? History of meningioma 03/04/2016   ??? Non-rheumatic mitral regurgitation 04/25/2014   ??? AR (allergic rhinitis) 09/29/2011   ??? Chronic systolic heart failure ([place])    ??? Mixed hyperlipidemia    ??? AICD (automatic cardioverter/defibrillator) present 11/15/2010   ??? Cataract 12/01/2008   ??? Gallstones 11/13/2008   ??? Insomnia 11/13/2008   ??? OSA (obstructive sleep apnea) 11/13/2008   ??? Fibroid 06/23/2008   ??? HTN (hypertension) 06/23/2008       Current Outpatient Medications   Medication Sig Dispense Refill   ??? fluticasone furoate-vilanteroL (Breo Ellipta) 200-25 mcg/dose inhaler Take 1 Puff by inhalation daily.     ??? acetaminophen (TylenoL) 325 mg tablet Take  by mouth every four (4) hours as needed for Pain.     ??? tacrolimus (PROGRAF) 1 mg capsule Take 8 mg every morning, take 7 mg every evening     ??? ondansetron (ZOFRAN ODT) 8 mg disintegrating tablet Take 8 mg by mouth.     ??? melatonin 3 mg tablet Take 3 mg by mouth.     ??? lidocaine 4 % patch 1 Patch by TransDERmal route daily.     ??? Flovent HFA 220 mcg/actuation inhaler      ??? docusate sodium (COLACE) 100 mg capsule Take 100 mg by mouth daily.     ??? diclofenac (VOLTAREN) 1 % gel Apply 2 g to affected area four (4) times daily.     ??? atorvastatin (LIPITOR) 40 mg tablet Take 40 mg by mouth daily.     ??? loratadine (CLARITIN) 10 mg tablet TAKE ONE TABLET BY MOUTH DAILY     ??? fluticasone propionate (FLONASE) 50 mcg/actuation nasal spray 1 Spray daily.         Allergies   Allergen Reactions   ???  Penicillamine Itching   ??? Penicillins Rash and Itching     Itching bumps on hands and arms  Itching bumps on hands and arms  Itching bumps on hands and arms  Has patient had a PCN reaction causing immediate rash, facial/tongue/throat swelling, SOB or lightheadedness with hypotension: Yes  Has patient had a PCN reaction causing severe rash involving mucus membranes or skin necrosis: No  Has patient had a PCN reaction that required hospitalization unknown  Has patient had a PCN reaction occurring within the last 10 years: No  If all of the above answers are "NO", then may proceed with Cephalosporin use.    Itching bumps on hands and arms   ??? Tramadol Other (comments)     Psychotic Hallucinations  Psychotic Hallucinations  hallucinations  Psychotic Hallucinations   ??? Chlorhexidine Towelette Itching     CHG Wipes   ???  Copper Itching       Past Medical History:   Diagnosis Date   ??? Age related osteoporosis    ??? Automatic implantable cardiac defibrillator in situ     Post ddd icd Set up carelink   ??? Baker cyst, left 12/2016   ??? Breast mass, left     benign   ??? CHF (congestive heart failure) (HCC) 06/23/2008    Non-ischemic CMP , Cath (2008) Normal coronary arteries   ??? Chronic systolic heart failure (HCC)     Stable,    ??? Degenerative arthritis of left knee    ??? Fibroid 06/23/2008   ??? History of brain tumor 2013   ??? HLD (hyperlipidemia)    ??? HTN (hypertension) 06/23/2008   ??? Hypotension, unspecified     Related to lisinopril   ??? 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 Left knee pain     With swelling   ??? Leg pain    ??? LVAD (left ventricular assist device) present (HCC) 03/2017   ??? 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 OSA (obstructive sleep apnea) 9/10   ??? Venous reflux    ??? Wears glasses        Past Surgical History:   Procedure Laterality Date   ??? FULL ESOPHAGEAL MANOMETRY  02/16/2017        ??? HX BREAST BIOPSY      rt breast, benign   ??? HX CHOLECYSTECTOMY      ??? HX CRANIOTOMY  2013    meningioma    ??? HX HEENT      glasses   ??? HX HYSTERECTOMY     ??? HX IMPLANTABLE CARDIOVERTER DEFIBRILLATOR     ??? HX PACEMAKER  2013    dual chamber icd   ??? HX TONSIL AND ADENOIDECTOMY     ??? HX TOTAL ABDOMINAL HYSTERECTOMY  1996   ??? HX TUBAL LIGATION         Family History   Problem Relation Age of Onset   ??? Diabetes Mother    ??? Heart Attack Father         MI   ??? Heart Disease Maternal Grandmother    ??? Heart Disease Paternal Grandmother    ??? Kidney Disease Other         1 sib deceased   ??? Cancer Other         1 sib throat cancer   ??? Diabetes Other    ??? Diabetes Daughter    ??? Other Daughter         leukemia   ??? Hypertension Other        Social History     Tobacco Use   ??? Smoking status: Never Smoker   ??? Smokeless tobacco: Never Used   Substance Use Topics   ??? Alcohol use: No       ROS   Review of Systems   Constitutional: Negative for chills and fever.   HENT: Negative for ear pain and sore throat.    Eyes: Negative for blurred vision and pain.   Respiratory: Negative for cough and shortness of breath.    Cardiovascular: Negative for chest pain.   Gastrointestinal: Negative for abdominal pain, blood in stool and melena.   Genitourinary: Negative for dysuria and hematuria.   Musculoskeletal: Positive for joint pain (left leg pain). Negative for myalgias.   Skin: Negative for rash.  Neurological: Negative for headaches.   Endo/Heme/Allergies: Does not bruise/bleed easily.   Psychiatric/Behavioral: Negative for substance abuse.       Objective     Vitals:    04/30/20 0858 04/30/20 0901   BP: (!) 109/47 (!) 114/56   Pulse: 83 82   Resp: 16    Temp: 97.3 ??F (36.3 ??C)    TempSrc: Temporal    Weight: 138 lb (62.6 kg)    Height: 5\' 10"  (1.778 m)    PainSc:   0 - No pain        Physical Exam  Vitals and nursing note reviewed.   HENT:      Head: Normocephalic and atraumatic.      Right Ear: External ear normal.      Left Ear: External ear normal.   Eyes:      General: No scleral icterus.        Right  eye: No discharge.         Left eye: No discharge.      Conjunctiva/sclera: Conjunctivae normal.   Cardiovascular:      Rate and Rhythm: Normal rate and regular rhythm.      Heart sounds: Normal heart sounds. No murmur heard.  No friction rub. No gallop.    Pulmonary:      Effort: Pulmonary effort is normal. No respiratory distress.      Breath sounds: Normal breath sounds. No wheezing or rales.   Abdominal:      General: Bowel sounds are normal. There is no distension.      Palpations: Abdomen is soft. There is no mass.      Tenderness: There is no abdominal tenderness. There is no guarding or rebound.   Musculoskeletal:         General: No swelling (BUE) or tenderness (BUE).      Cervical back: Neck supple.      Comments: She has a nodule along lower leg just inferior to her knee jt that is mobile but TTP   Lymphadenopathy:      Cervical: No cervical adenopathy.   Skin:     General: Skin is warm and dry.      Findings: No erythema or rash.   Neurological:      Mental Status: She is alert.      Motor: No abnormal muscle tone.      Gait: Gait normal.   Psychiatric:         Mood and Affect: Mood normal.         LABS   Data Review:   Lab Results   Component Value Date/Time    WBC 6.1 08/30/2019 01:00 PM    Hemoglobin, POC 11.6 (L) 01/28/2018 02:24 PM    HGB 8.6 (L) 08/30/2019 01:00 PM    Hematocrit, POC 34 (L) 01/28/2018 02:24 PM    HCT 30.3 (L) 08/30/2019 01:00 PM    PLATELET 212 08/30/2019 01:00 PM    MCV 88.9 08/30/2019 01:00 PM       Lab Results   Component Value Date/Time    Sodium 140 08/30/2019 01:00 PM    Potassium 5.3 08/30/2019 01:00 PM    Chloride 107 08/30/2019 01:00 PM    CO2 28 08/30/2019 01:00 PM    Anion gap 5 08/30/2019 01:00 PM    Glucose 98 08/30/2019 01:00 PM    BUN 28 (H) 08/30/2019 01:00 PM    Creatinine 1.16 08/30/2019 01:00 PM    BUN/Creatinine ratio 24 (H) 08/30/2019  01:00 PM    GFR est AA 56 (L) 08/30/2019 01:00 PM    GFR est non-AA 46 (L) 08/30/2019 01:00 PM    Calcium 9.2 08/30/2019 01:00  PM       Lab Results   Component Value Date/Time    Cholesterol, total 170 08/30/2019 01:00 PM    HDL Cholesterol 64 (H) 08/30/2019 01:00 PM    LDL, calculated 86 08/30/2019 01:00 PM    VLDL, calculated 20 08/30/2019 01:00 PM    Triglyceride 100 08/30/2019 01:00 PM    CHOL/HDL Ratio 2.7 08/30/2019 01:00 PM       Lab Results   Component Value Date/Time    Hemoglobin A1c 5.8 12/09/2012 10:50 AM       Assessment/Plan:   1. Chronic systolic heart failure: Heart transplant recipient.  ???Continue with medication as prescribed.  ???I encouraged her to follow-up with her cardiologist for routine care and checkups given her history of a heart transplant.    2. Multinodular goiter:   ???Ordering a thyroid ultrasound.    ORDERS:  - US THYROID/PARATHYROID/SOFT TISS; Future    3. Leg mass, left:   ???Requesting outside records.  ???I encouraged her to follow-up with [facility].     4. HLD: Stable.  ???Continue with medication as prescribed.    5. Abnormal MRI: +Tongue Paresthesias.  Unchanged.  ???I encouraged her to follow-up with her neurologist for further recommendations.  ???She can continue with medication as recommended by her neurologist but I am requesting records that I understand the plan of care and what medications are being attempted by her neurologist.    6.  Asthma: Stable.  ???Continue with Breo inhaler.    7.  Anemia: Hemoglobin is in the 8 range.  ???I encouraged her to follow-up with her hematologist.  ???Continue with iron therapy        **I will see her back in 2 months.**    Health Maintenance Due   Topic Date Due   ??? Shingrix Vaccine Age 81> (1 of 2) Never done   ??? COVID-19 Vaccine (3 - Pfizer risk 4-dose series) 06/09/2019         Lab review: labs are reviewed in the EHR    I have discussed the diagnosis with the patient and the intended plan as seen in the above orders.  The patient has received an after-visit summary and questions were answered concerning future plans.  I have discussed medication side effects  and warnings with the patient as well. I have reviewed the plan of care with the patient, accepted their input and they are in agreement with the treatment goals. All questions were answered. The patient understands the plan of care. Handouts provided today with above information. Pt instructed if symptoms worsen to call the office or report to the ED for continued care.  Greater than 50% of the visit time was spent in counseling and/or coordination of care.      Voice recognition was used to generate this report, which may have resulted in some phonetic based errors in grammar and contents. Even though attempts were made to correct all the mistakes, some may have been missed, and remained in the body of the document.          Glennie Hawk, MD

## 2020-05-30 NOTE — Telephone Encounter (Signed)
appt scheduled for tomorrow 05/31/20 at 11:40 AM    Both pt and Cathy [name] at [facility] aware. Cathy [name] will fax clearance paperwork

## 2020-05-30 NOTE — Telephone Encounter (Signed)
appt request, please see highlighted info.           ----- Message from Judeen Hammans sent at 05/30/2020  3:58 PM EDT -----  Subject: Appointment Request    Reason for Call: Routine (Patient Request) No Script    QUESTIONS  Type of Appointment? Established Patient  Reason for appointment request? Available appointments did not meet   patient need  Additional Information for Provider? pt needs appt before surgery on april   11th for physical, plz call imogene at [phone] ext [phone]  ---------------------------------------------------------------------------  --------------  CALL BACK INFO  What is the best way for the office to contact you? OK to leave message on   voicemail  Preferred Call Back Phone Number? [phone]  ---------------------------------------------------------------------------  --------------  SCRIPT ANSWERS  Relationship to Patient? Third Party  Third Party Type? Other  Other Third Party Type? [facility]  Representative Name? imogine  (Is the patient requesting to see the provider for a procedure?)? No  (Is the patient requesting to see the provider urgently - today or   tomorrow.)? No  Have you been diagnosed with, awaiting test results for, or told that you   are suspected of having COVID-19 (Coronavirus)? (If patient has tested   negative or was tested as a requirement for work, school, or travel and   not based on symptoms, answer no)? No  Within the past 10 days have you developed any of the following symptoms   (answer "no" if symptoms have been present longer than 10 days or began   more than 10 days ago)? Fever or Chills, Cough, Shortness of breath or   difficulty breathing, Loss of taste or smell, Sore throat, Nasal   congestion, Sneezing or runny nose, Fatigue or generalized body aches   (answer no if pain is specific to a body part e.g. back pain), Diarrhea,   Headache? No  Have you had close contact with someone with COVID-19 in the last 7 days?   No  (Service Expert -  click yes below to proceed with [employer] As Usual   Scheduling)? Yes

## 2020-05-31 ENCOUNTER — Encounter

## 2020-05-31 ENCOUNTER — Ambulatory Visit: Admit: 2020-05-31 | Discharge: 2020-05-31 | Payer: MEDICARE | Attending: Family Medicine | Primary: Family Medicine

## 2020-05-31 ENCOUNTER — Ambulatory Visit: Attending: Family Medicine | Primary: Family Medicine

## 2020-05-31 DIAGNOSIS — D649 Anemia, unspecified: Secondary | ICD-10-CM

## 2020-05-31 MED ORDER — MYCOPHENOLATE MOFETIL 250 MG CAP
250 mg | ORAL_CAPSULE | Freq: Two times a day (BID) | ORAL | 5 refills | Status: AC
Start: 2020-05-31 — End: 2020-12-27

## 2020-05-31 NOTE — Progress Notes (Signed)
 Cathy [name] presents today for   Chief Complaint   Patient presents with   . Pre-op Exam     Scheduled for Left eye on 06-04-20 then Right Eye 2 weeks after.        1. Have you been to the ER, urgent care clinic since your last visit?  Hospitalized since your last visit? no    2. Have you seen or consulted any other health care providers outside of the [facility] since your last visit? yes     3. For patients aged 48-75: Has the patient had a colonoscopy / FIT/ Cologuard? Yes - no Care Gap present      If the patient is female:    4. For patients aged 61-74: Has the patient had a mammogram within the past 2 years? Yes - no Care Gap present  See top three    5. For patients aged 21-65: Has the patient had a pap smear? Yes - no Care Gap present

## 2020-05-31 NOTE — Progress Notes (Signed)
[facility]:   Preoperative Evaluation    Date of Exam: 05/31/20    MRN: [id number]    Cathy [name]  Is a 70 y.o. African American female  who presents for preoperative evaluation and management.    Chief Complaint   Patient presents with   ??? Pre-op Exam     Scheduled for Left eye on 06-04-20 then Right Eye 2 weeks after.        Subjective:   The patient is a 70 year old female with history of hypertension, chronic systolic heart failure??(followed by Dr.McCray) with EF of 10%, mitral regurgitation, cholelithiasis, asthma, multinodular goiter, obstructive sleep apnea??(not on rx), allergic rhinitis, osteoporosis, uterine fibroids per EHR, insomnia, cataracts,??dysphagia from achalasia?,??LVAD placement in 2019,??AICD placement,??S/p heart transplant (2020, followed by [facility]),??seizure,??left distal radial artery occlusion (2020),??left Baker's cyst,??Covid-19 (2021),??hyperlipidemia, and meningioma status post resection.    1. CHF: +Heart transplant recipient.  Taking Prograf.  There are no signs of organ rejection.  Her next appointment with her cardiologist is reported to be in June.  Today she reports: no CP. She has some SOB. Pox can drop at home to 89% on RA.     2. Asthma: Well-controlled on Breo.Today she reports: she is scheduled with her pulmonologist later this month. Her SOB sx are unchanged and chronic.     3.  Multinodular goiter: A thyroid ultrasound was ordered at her last appointment.  She is scheduled for this study later this month.    4.  Abnormal MRI: At her last appointment, she reported tongue paresthesias that were unchanged.  She was encouraged to schedule a follow-up appointment.  Interestingly, she has a history of angioedema with food.  Today she reports: she continues to have tongue paresthesias. +She has some trouble swallowing. "It just feels like it's a hard time going down [food]." This sensation is unchanged since her last apt. She is scheduled with Neurology.  Vision/Hearing Changes: none. Her paresthesias are present along her entire tongue. S/p Neurology apt.  She was to take elavil. She is scheduled with Neurology later this year. She's had paresthesias along her tongue since September.     02/09/20 Head MRI:??????No acute intracranial hemorrhage or territorial infarct. Chronically present U-shaped area of abnormal brain parenchyma at the right frontoparietal junction. No enhancement Etiologic considerations include cortical dysplasia, low-grade glioma, or multinodular and vacuolating neuronal tumor (MVNT).??Ideally corroborated with prior outside MRI if such exists Previous resection of large right frontal lobe meningioma, that region stable in appearance as well. Extensive remote microhemorrhage. This can relate to hypertensive sequelae, perhaps intermixed with amyloid angiopathy.    5. LLE Mass: Per ultrasound in February.  Findings are listed below.  Status post meeting with Dr. Delma Post. She was told that these masses were not cancer. She is scheduled nevertheless per her hx for surgical removal. She has pain along her LLE mass area. +Taking tylenol.     LLE ultrasound 04/13/20:  Solid hypoechoic nodule in the superficial soft tissues of the left lower leg at the area of interest. Imaging features are not specific. Features are not typical for lipoma or cyst and other solid soft tissue masses should be considered to include both benign and malignant soft tissue neoplasms.     6.  Anemia: Her hemoglobin last month was 8.6.  Her white blood cell count was 2.5. No bleeding.+Chronic anemia.       General Information:  Procedure/Surgery:eye surgery  Date of Procedure/Surgery: April 2022  Primary Physician: Azzie Almas, MD  Surgery status: Elective  Surgery risk: Low (endoscopy, cataract, breast, superficial procedure)    Cardiovascular Risk Factors:  1. Coronary revascularization within 5 years: yes  2. Recurrent chest pain: no  3. Shortness of breath:  yes but chronic and  unchanged  4. Recent coronary evaluation/stress test/angiogram:  no - she had a heart transplant in 2020  5. Recent MI (less than 1 month ago):  no  6. Prior MI (by way of history or pathological waves):  yes  7. Compensated CHF or h/o CHF:  yes  8. Severe valvular disease:  no  9. Decompensated CHF:  no  10. High-grade atrioventricular block:  no  11. Arrhythmia:  not right now but she has a h/o torsades and atrial tachycardia.     Other Risk Factors:  1. Diabetes hx:  no  2. H/o CVA:  no  3. Uncontrolled hypertension:  no  4, Advanced age:  yes  5. Low functional capacity:  no  6. Recent use of: No recent use of aspirin (ASA), NSAIDS or steroids  7. Tetanus up to date: Td vaccination is not indicated  8. Anesthesia Complications: None  9. History of abnormal bleeding : None  10. History of Blood Transfusions: yes  11. Health Care Directive or Living Will: yes  12. Latex Allergy: no      Problem List:     Patient Active Problem List    Diagnosis Date Noted   ??? Abnormal MRI 04/30/2020   ??? Depression 08/30/2019   ??? History of seizures 08/30/2019   ??? Mild persistent asthma without complication 16/96/7893   ??? Anemia 08/30/2019   ??? Thyroid nodule 08/30/2019   ??? Esophageal dysphagia 03/17/2017   ??? Age-related osteoporosis without current pathological fracture 03/04/2016   ??? Multinodular goiter 03/04/2016   ??? History of meningioma 03/04/2016   ??? Non-rheumatic mitral regurgitation 04/25/2014   ??? AR (allergic rhinitis) 09/29/2011   ??? Chronic systolic heart failure ([place])    ??? Mixed hyperlipidemia    ??? AICD (automatic cardioverter/defibrillator) present 11/15/2010   ??? Cataract 12/01/2008   ??? Gallstones 11/13/2008   ??? OSA (obstructive sleep apnea) 11/13/2008   ??? Fibroid 06/23/2008   ??? HTN (hypertension) 06/23/2008     Medical History:     Past Medical History:   Diagnosis Date   ??? Age related osteoporosis    ??? Automatic implantable cardiac defibrillator in situ     Post ddd icd Set up carelink   ??? Baker cyst, left 12/2016   ???  Breast mass, left     benign   ??? CHF (congestive heart failure) ([place]) 06/23/2008    Non-ischemic CMP , Cath (2008) Normal coronary arteries   ??? Chronic systolic heart failure ([facility])     Stable,    ??? Degenerative arthritis of left knee    ??? Fibroid 06/23/2008   ??? History of brain tumor 2013   ??? HLD (hyperlipidemia)    ??? HTN (hypertension) 06/23/2008   ??? Hypotension, unspecified     Related to lisinopril   ??? 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 Left knee pain     With swelling   ??? Leg pain    ??? LVAD (left ventricular assist device) present ([place]) 03/2017   ??? 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 OSA (obstructive sleep apnea) 9/10   ??? Venous reflux    ??? Wears glasses  Allergies:     Allergies   Allergen Reactions   ??? Penicillamine Itching   ??? Penicillins Rash and Itching     Itching bumps on hands and arms  Itching bumps on hands and arms  Itching bumps on hands and arms  Has patient had a PCN reaction causing immediate rash, facial/tongue/throat swelling, SOB or lightheadedness with hypotension: Yes  Has patient had a PCN reaction causing severe rash involving mucus membranes or skin necrosis: No  Has patient had a PCN reaction that required hospitalization unknown  Has patient had a PCN reaction occurring within the last 10 years: No  If all of the above answers are "NO", then may proceed with Cephalosporin use.    Itching bumps on hands and arms   ??? Tramadol Other (comments)     Psychotic Hallucinations  Psychotic Hallucinations  hallucinations  Psychotic Hallucinations   ??? Chlorhexidine Towelette Itching     CHG Wipes   ??? Copper Itching      Medications:     Current Outpatient Medications   Medication Sig   ??? dorzolamide (TRUSOPT) 2 % ophthalmic solution    ??? brimonidine (ALPHAGAN) 0.2 % ophthalmic solution    ??? prednisoLONE acetate (PRED FORTE) 1 % ophthalmic suspension    ??? amitriptyline (ELAVIL) 25 mg tablet    ??? latanoprost (XALATAN) 0.005 %  ophthalmic solution    ??? fluticasone furoate-vilanteroL (Breo Ellipta) 200-25 mcg/dose inhaler Take 1 Puff by inhalation daily.   ??? acetaminophen (TylenoL) 325 mg tablet Take  by mouth every four (4) hours as needed for Pain.   ??? tacrolimus (PROGRAF) 1 mg capsule Take 8 mg every morning, take 7 mg every evening   ??? ondansetron (ZOFRAN ODT) 8 mg disintegrating tablet Take 8 mg by mouth.   ??? melatonin 3 mg tablet Take 3 mg by mouth.   ??? lidocaine 4 % patch 1 Patch by TransDERmal route daily.   ??? docusate sodium (COLACE) 100 mg capsule Take 100 mg by mouth daily.   ??? diclofenac (VOLTAREN) 1 % gel Apply 2 g to affected area four (4) times daily.   ??? atorvastatin (LIPITOR) 40 mg tablet Take 40 mg by mouth daily.   ??? Flovent HFA 220 mcg/actuation inhaler  (Patient not taking: Reported on 05/31/2020)     No current facility-administered medications for this visit.     Surgical History:     Past Surgical History:   Procedure Laterality Date   ??? FULL ESOPHAGEAL MANOMETRY  02/16/2017        ??? HX BREAST BIOPSY      rt breast, benign   ??? HX CHOLECYSTECTOMY     ??? HX CRANIOTOMY  2013    meningioma    ??? HX HEENT      glasses   ??? HX HYSTERECTOMY     ??? HX IMPLANTABLE CARDIOVERTER DEFIBRILLATOR     ??? HX PACEMAKER  2013    dual chamber icd   ??? HX TONSIL AND ADENOIDECTOMY     ??? HX TOTAL ABDOMINAL HYSTERECTOMY  1996   ??? HX TUBAL LIGATION       Social History:     Social History     Socioeconomic History   ??? Marital status: MARRIED   Occupational History   ??? Occupation: home health aide   Tobacco Use   ??? Smoking status: Never Smoker   ??? Smokeless tobacco: Never Used   Substance and Sexual Activity   ??? Alcohol use: No   ???  Drug use: No   ??? Sexual activity: Yes     Partners: Male     Birth control/protection: Condom         REVIEW OF SYSTEMS:  Review of Systems   Constitutional: Negative for chills and fever.   HENT: Negative for ear pain and sore throat.    Eyes: Positive for blurred vision. Negative for pain.   Respiratory: Positive for  shortness of breath (chronic/unchanged). Negative for cough.    Cardiovascular: Negative for chest pain.   Gastrointestinal: Negative for abdominal pain, blood in stool and melena.        +Dysphagia sx off/on   Genitourinary: Negative for dysuria and hematuria.   Musculoskeletal: Negative for joint pain and myalgias.   Skin: Negative for rash.   Neurological: Negative for headaches.   Endo/Heme/Allergies: Does not bruise/bleed easily.   Psychiatric/Behavioral: Negative for substance abuse.       Objective:     Vitals:    05/31/20 1147   Resp: 14   Temp: 97.4 ??F (36.3 ??C)   TempSrc: Temporal   Weight: 139 lb (63 kg)   Height: '5\' 10"'  (1.778 m)   PainSc:   0 - No pain       Physical Exam  Vitals and nursing note reviewed.   HENT:      Head: Normocephalic and atraumatic.      Right Ear: External ear normal.      Left Ear: External ear normal.   Eyes:      General: No scleral icterus.        Right eye: No discharge.         Left eye: No discharge.      Conjunctiva/sclera: Conjunctivae normal.   Cardiovascular:      Rate and Rhythm: Normal rate and regular rhythm.      Heart sounds: Murmur (soft 1/6 early systolic murmur present) heard.   No friction rub. No gallop.    Pulmonary:      Effort: Pulmonary effort is normal. No respiratory distress.      Breath sounds: Normal breath sounds. No wheezing or rales.   Abdominal:      General: Bowel sounds are normal. There is no distension.      Palpations: Abdomen is soft. There is no mass.      Tenderness: There is no abdominal tenderness. There is no guarding or rebound.   Musculoskeletal:         General: No swelling (BUE) or tenderness (BUE).      Cervical back: Neck supple.   Lymphadenopathy:      Cervical: No cervical adenopathy.   Skin:     General: Skin is warm and dry.      Findings: No erythema or rash.      Comments: LLE just below her left knee jt laterally has a pea sized nodule, circular, easily movable, w/o erythema but TTP   Neurological:      Mental Status: She  is alert.      Motor: No abnormal muscle tone.      Gait: Gait normal.   Psychiatric:         Mood and Affect: Mood normal.         DIAGNOSTICS:   Lab Results   Component Value Date/Time    WBC 6.1 08/30/2019 01:00 PM    Hemoglobin, POC 11.6 (L) 01/28/2018 02:24 PM    HGB 8.6 (L) 08/30/2019 01:00 PM    Hematocrit, POC 34 (L)  01/28/2018 02:24 PM    HCT 30.3 (L) 08/30/2019 01:00 PM    PLATELET 212 08/30/2019 01:00 PM    MCV 88.9 08/30/2019 01:00 PM     Lab Results   Component Value Date/Time    Sodium 140 08/30/2019 01:00 PM    Potassium 5.3 08/30/2019 01:00 PM    Chloride 107 08/30/2019 01:00 PM    CO2 28 08/30/2019 01:00 PM    Anion gap 5 08/30/2019 01:00 PM    Glucose 98 08/30/2019 01:00 PM    BUN 28 (H) 08/30/2019 01:00 PM    Creatinine 1.16 08/30/2019 01:00 PM    BUN/Creatinine ratio 24 (H) 08/30/2019 01:00 PM    GFR est AA 56 (L) 08/30/2019 01:00 PM    GFR est non-AA 46 (L) 08/30/2019 01:00 PM    Calcium 9.2 08/30/2019 01:00 PM    Bilirubin, total 0.3 08/30/2019 01:00 PM    Alk. phosphatase 177 (H) 08/30/2019 01:00 PM    Protein, total 7.5 08/30/2019 01:00 PM    Albumin 3.9 08/30/2019 01:00 PM    Globulin 3.6 08/30/2019 01:00 PM    A-G Ratio 1.1 08/30/2019 01:00 PM    ALT (SGPT) 24 08/30/2019 01:00 PM    AST (SGOT) 14 08/30/2019 01:00 PM     Recent labs from March 2022 in the [facility] was reviewed.     Contains abnormal data??CBC WITH DIFFERENTIAL AUTO  Specimen:  Blood - Blood (substance)   Ref Range & Units 1 mo ago   WBC x 10*3 4.0 - 11.0 K/uL 2.5??Low??    RBC x 10^6 3.80 - 5.20 M/uL 2.71??Low??    HGB 11.7 - 16.1 g/dL 8.6??Low??    HCT 35.1 - 48.3 % 26.9??Low??    MCV 80 - 99 fL 99    MCH 26 - 34 pg 32    MCHC 31 - 36 g/dL 32    RDW 10.0 - 15.5 % 13.4    Platelet 140 - 440 K/uL 164    MPV 9.0 - 13.0 fL 11.0    Segmented Neutrophils (Auto) 40 - 75 % 59    Lymphocytes (Auto) 20 - 45 % 22    Monocytes (Auto) 3 - 12 % 16??High??    Eosinophils (Auto) 0 - 6 % 1    Basophils (Auto) 0 - 2 % 1    Absolute Neutrophils  (Auto) 1.8 - 7.7 K/uL 1.5??Low??    Absolute Lymphocytes (Auto) 1.0 - 4.8 K/uL 0.5??Low??    Absolute Monocytes (Auto) 0.1 - 1.0 K/uL 0.4    Absolute Eosinophils (Auto) 0.0 - 0.5 K/uL 0.0    Absolute Basophils (Auto) 0.0 - 0.2 K/uL 0.0    Resulting Agency  [facility]   Specimen Collected: 04/24/20 10:25 AM Last Resulted: 04/24/20 12:01 PM   Received From: [place]  Result Received: 05/31/20 11:39 AM     Contains abnormal data??BASIC METABOLIC PANEL (If I-STAT not available)  Specimen:  Blood - Blood (substance)   Ref Range & Units 1 mo ago Comments   Potassium 3.5 - 5.5 mmol/L 5.4     Sodium 133 - 145 mmol/L 141     Chloride 98 - 110 mmol/L 107     Glucose 70 - 99 mg/dL 78     Calcium 8.4 - 10.5 mg/dL 9.2     BUN 6 - 22 mg/dL 28??High??     Creatinine 0.8 - 1.4 mg/dL 1.1     CO2 20 - 32 mmol/L 24  eGFR African American >60.0 57.1??Low??     eGFR Non African American >60.0 47.1??Low??     Anion Gap 3.0 - 15.0 mmol/L 10.0  Anion Gap calculation based on electrolyte reference ranges.   Resulting Agency  [facility]      Narrative  Performed by [facility]  Estimated GFR results are reported in mL/min/1.73 sq.m. by the MDRD equation.     This eGFR is validated for stable chronic renal failure patients. This equation is unreliable in acute illness or patients with normal renal function.  Specimen Collected: 04/24/20 10:25 AM Last Resulted: 04/24/20 12:15 PM   Received From: [place]  Result Received: 05/31/20 11:39 AM         Assessment/Plan:   1. Anemia: Chronic/unchanged.  - She was encouraged to f/u with her Hematologist for routine check ups  - Ordering an occult blood stool test. She has not had one this year    ORDERS:  - OCCULT BLOOD IMMUNOASSAY,DIAGNOSTIC; Future    2. Dysphagia: From GERD?  - Barium swallow study ordered.    ORDERS:  - XR BA SWALLOW ESOPHOGRAM; Future    3. LLE Mass: Not malignant per her hx  - I encouraged her to f/u with Orthopedics for surgical  removal since this mass is a source of pain for her.     4. CHF: +Heart Transplant Recipient  - C/w rx per Cardiology  - C/w routine check ups with Cardiology for long term management.    5. Asthma: Stable.  - C/w rx as prescribed.    6. Multinodular Goiter:  - I encouraged her to get her upcoming thyroid ultrasound    7. Abnormal MRI: +Tongue Paresthesias:   - Requesting records from her neurologist.   - I encouraged her to f/u w/ Neurology for evaluation/treatment recommendations    Preoperative Assessment: No contraindications to planned surgery as long as her Pulmonary and Cardiology teams are ok with her proceeding with surgery  Orders/studies that need to be obtained prior to surgical clearance: medical clearance has been obtained pending Cardiology/Pulmonology clearance.     Pt is to undergo a low risk procedure with a high cardiac risk based on current history - heart transplant recipient. Labs and imaging within acceptable range. No contraindications to planned surgery. She knows not to use NSAIDS. Follow up as scheduled post operatively.     I have discussed the plan of care with the patient. The patient has received an after-visit summary and questions were answered concerning future plans.  I have discussed medication side effects and warnings with the patient as well. All questions were answered. The patient understands the plan of care. Handouts provided today with the above information. Pt instructed if symptoms worsen to call the office or report to the ED for continued care.  Greater than 50% of the visit time was spent in counseling and/or coordination of care.      Dr. Marius Ditch  [facility], [place] [address]  Phone: [phone]  Fax: [phone]

## 2020-06-01 NOTE — Telephone Encounter (Signed)
Note has been faxed. Thank you

## 2020-06-01 NOTE — Telephone Encounter (Signed)
Kim with [facility] called, states that patient has surgery scheduled for Monday 06/04/20, and they are requesting the Pre-op clearance notes from Patient's ov yesterday to be faxed.    Please let me know when the note has been finalized and I will fax it. Thank you!    Phone: [phone]  Fax: [phone]

## 2020-06-05 ENCOUNTER — Inpatient Hospital Stay: Admit: 2020-06-05 | Payer: MEDICARE | Attending: Family Medicine | Primary: Family Medicine

## 2020-06-05 DIAGNOSIS — E042 Nontoxic multinodular goiter: Secondary | ICD-10-CM

## 2020-06-11 NOTE — Telephone Encounter (Signed)
-----   Message from Leo Rod, MD sent at 06/11/2020  6:34 AM EDT -----  Please let her know that her thyroid ultrasound findings are consistent with thyroiditis or irritation to the thyroid gland.  There are no significant changes to her known left-sided thyroid nodules.  She has a new subcentimeter left thyroid nodule and a 1.1 cm nodule along the center of her gland.  No additional studies are warranted at this time for these findings.  I will check another thyroid ultrasound in 1 to 2 years.    Dr. Marius Ditch  [facility], [place] [address]  Phone: [phone]  Fax: [phone]

## 2020-06-11 NOTE — Telephone Encounter (Signed)
LVM for the patient to return my call regarding their results.

## 2020-06-12 ENCOUNTER — Telehealth: Admit: 2020-06-12 | Discharge: 2020-06-12 | Payer: MEDICARE | Attending: Family Medicine | Primary: Family Medicine

## 2020-06-12 ENCOUNTER — Telehealth: Attending: Family Medicine | Primary: Family Medicine

## 2020-06-12 DIAGNOSIS — R509 Fever, unspecified: Secondary | ICD-10-CM

## 2020-06-12 NOTE — Telephone Encounter (Signed)
Telephone Encounter by Crist Infante, LPN at 81/15/72 6203                Author: Crist Infante, LPN  Service: --  Author Type: Licensed Nurse       Filed: 06/12/20 1331  Encounter Date: 06/11/2020  Status: Signed          Editor: Crist Infante, LPN (Licensed Nurse)          From: Roland Rack SimpsonTo: Leo Rod, MDSent: 06/11/2020 10:23 PM EDTSubject: Chills I have been having Chills, coughing,  [place],  for more  than 3 days, temperature was 102 on Sunday.  I am taking robitussin,  is it anything else that you would recommend

## 2020-06-12 NOTE — Progress Notes (Signed)
Cathy [name] is a 70 y.o. female who was seen by synchronous (real-time) audio-video technology on 06/12/2020.        Assessment & Plan:   Fever: In the setting of immunosuppression from tacrolimus/mycophenolate.  +Cardiac transplant recipient. +Frequency. +Coughing. +Pleuritic CP. +H/o right cavitary lung lesion in 2021. Pox - 86-97% on RA.   ???The patient was instructed to go to the [facility] ED for evaluation.  I then spoke with the doctor at [facility] ED and discussed her care.  He agrees that she should be evaluated in the emergency department.  ??? I discussed with Cathy [name] how she would likely need imaging studies, labs, and urine studies.  All questions were answered.      Lab review: labs are reviewed in the EHR     I have discussed the diagnosis with the patient and the intended plan as seen in the above orders. I have discussed medication side effects and warnings with the patient as well. I have reviewed the plan of care with the patient, accepted their input and they are in agreement with the treatment goals. All questions were answered. The patient understands the plan of care. Pt instructed if symptoms worsen to call the office or report to the ED for continued care.  Greater than 50% of the visit time was spent in counseling and/or coordination of care.     Voice recognition was used to generate this report, which may have resulted in some phonetic based errors in grammar and contents. Even though attempts were made to correct all the mistakes, some may have been missed, and remained in the body of the document.      Subjective:   Cathy [name] was seen for   Chief Complaint   Patient presents with   ??? Fever     The patient is a??70 year old female with history of hypertension, chronic systolic heart failure??(followed by Dr.McCray) with EF of 10%, mitral regurgitation, cholelithiasis,??asthma,??multinodular goiter, obstructive sleep apnea??(not on rx), allergic rhinitis, osteoporosis, uterine fibroids  per EHR, insomnia, cataracts,??dysphagia from achalasia?,??LVAD placement in 2019,??AICD placement,??S/p heart transplant (2020, followed by [facility]),??seizure,??left distal radial artery occlusion (2020),??left Baker's cyst,??Covid-19 (2021),??hyperlipidemia, and meningioma status post resection.    Fever:  Sx began just before the weekend with chills, "upset stomach," and by Sunday, her temperature was up to 102. She developed a cough but tested negative for Covid-19. Taking Robitussin prn cough sx. She has pain along her lower back that radiates down her left leg. No urinary sx other than increased frequency. No sick contacts. +Coughing, "especially at night." Pox: 86%-97%. +Pleuritic CP "especially when I cough." No CP at rest.     Her transplant team ordered a chest and abdomen CT due to a 10lbs weight loss in the past month per her hx.  Her weight is down to 127lbs today.  She is eating a lot and not gaining weight. +Sore throat. No eye/ear pain.  No bleeding.  Pox: 97% RA during her visit.     Note that she has a history of a cardiac transplant.  She is taking tacrolimus and mycophenolate.    She also has a history of a cavitary lung lesion status post antibiotics.      Prior to Admission medications    Medication Sig Start Date End Date Taking? Authorizing Provider   dorzolamide (TRUSOPT) 2 % ophthalmic solution  04/02/20   Provider, Historical   brimonidine (ALPHAGAN) 0.2 % ophthalmic solution  05/08/20   Provider, Historical   prednisoLONE  acetate (PRED FORTE) 1 % ophthalmic suspension  04/30/20   Provider, Historical   amitriptyline (ELAVIL) 25 mg tablet  05/19/20   Provider, Historical   latanoprost (XALATAN) 0.005 % ophthalmic solution  05/08/20   Provider, Historical   mycophenolate mofetil (CELLCEPT) 250 mg capsule Take 1 Capsule by mouth two (2) times a day. 05/31/20   Leo Rod, MD   fluticasone furoate-vilanteroL (Breo Ellipta) 200-25 mcg/dose inhaler Take 1 Puff by inhalation daily. 12/15/19    Provider, Historical   acetaminophen (TylenoL) 325 mg tablet Take  by mouth every four (4) hours as needed for Pain.    Provider, Historical   tacrolimus (PROGRAF) 1 mg capsule Take 8 mg every morning, take 7 mg every evening 08/15/19   Provider, Historical   ondansetron (ZOFRAN ODT) 8 mg disintegrating tablet Take 8 mg by mouth. 07/11/19   Provider, Historical   melatonin 3 mg tablet Take 3 mg by mouth. 06/15/19   Provider, Historical   lidocaine 4 % patch 1 Patch by TransDERmal route daily. 06/16/19   Provider, Historical   docusate sodium (COLACE) 100 mg capsule Take 100 mg by mouth daily. 07/11/19   Provider, Historical   diclofenac (VOLTAREN) 1 % gel Apply 2 g to affected area four (4) times daily. 06/15/19   Provider, Historical   atorvastatin (LIPITOR) 40 mg tablet Take 40 mg by mouth daily. 04/30/17   Provider, Historical     Allergies   Allergen Reactions   ??? Penicillamine Itching   ??? Penicillins Rash and Itching     Itching bumps on hands and arms  Itching bumps on hands and arms  Itching bumps on hands and arms  Has patient had a PCN reaction causing immediate rash, facial/tongue/throat swelling, SOB or lightheadedness with hypotension: Yes  Has patient had a PCN reaction causing severe rash involving mucus membranes or skin necrosis: No  Has patient had a PCN reaction that required hospitalization unknown  Has patient had a PCN reaction occurring within the last 10 years: No  If all of the above answers are "NO", then may proceed with Cephalosporin use.    Itching bumps on hands and arms   ??? Tramadol Other (comments)     Psychotic Hallucinations  Psychotic Hallucinations  hallucinations  Psychotic Hallucinations   ??? Chlorhexidine Towelette Itching     CHG Wipes   ??? Copper Itching     Past Medical History:   Diagnosis Date   ??? Age related osteoporosis    ??? Automatic implantable cardiac defibrillator in situ     Post ddd icd Set up carelink   ??? Baker cyst, left 12/2016   ??? Breast mass, left     benign   ??? CHF  (congestive heart failure) ([place]) 06/23/2008    Non-ischemic CMP , Cath (2008) Normal coronary arteries   ??? Chronic systolic heart failure (HCC)     Stable,    ??? Degenerative arthritis of left knee    ??? Fibroid 06/23/2008   ??? History of brain tumor 2013   ??? HLD (hyperlipidemia)    ??? HTN (hypertension) 06/23/2008   ??? Hypotension, unspecified     Related to lisinopril   ??? 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 Left knee pain     With swelling   ??? Leg pain    ??? LVAD (left ventricular assist device) present ([place]) 03/2017   ??? Mitral valve disorders(424.0) 04/11/2013    mod  to severe mr    ??? OSA (obstructive sleep apnea) 9/10   ??? Venous reflux    ??? Wears glasses      Past Surgical History:   Procedure Laterality Date   ??? FULL ESOPHAGEAL MANOMETRY  02/16/2017        ??? HX BREAST BIOPSY      rt breast, benign   ??? HX CHOLECYSTECTOMY     ??? HX CRANIOTOMY  2013    meningioma    ??? HX HEENT      glasses   ??? HX HYSTERECTOMY     ??? HX IMPLANTABLE CARDIOVERTER DEFIBRILLATOR     ??? HX PACEMAKER  2013    dual chamber icd   ??? HX TONSIL AND ADENOIDECTOMY     ??? HX TOTAL ABDOMINAL HYSTERECTOMY  1996   ??? HX TUBAL LIGATION       Family History   Problem Relation Age of Onset   ??? Diabetes Mother    ??? Heart Attack Father         MI   ??? Heart Disease Maternal Grandmother    ??? Heart Disease Paternal Grandmother    ??? Kidney Disease Other         1 sib deceased   ??? Cancer Other         1 sib throat cancer   ??? Diabetes Other    ??? Diabetes Daughter    ??? Other Daughter         leukemia   ??? Hypertension Other      Social History     Socioeconomic History   ??? Marital status: MARRIED   Occupational History   ??? Occupation: home health aide   Tobacco Use   ??? Smoking status: Never Smoker   ??? Smokeless tobacco: Never Used   Substance and Sexual Activity   ??? Alcohol use: No   ??? Drug use: No   ??? Sexual activity: Yes     Partners: Male     Birth control/protection: Condom       ROS:  Gen: See HPI  HEENT: See HPI  CV: See  HPI  Resp: See HPI  GI: No abdominal pain  GU: No hematuria/dysuria  Derm: No rash  Neuro: No new paresthesias/weakness  Musc: No new myalgias/jt pain  Psych: No depression sx      Objective:     General: alert, cooperative, no distress   Mental  status: mental status: alert, oriented to person, place, and time, normal mood, behavior, speech, dress, motor activity, and thought processes   Resp: resp: normal effort and no respiratory distress   Neuro: neuro: no gross deficits   Skin: skin: no discoloration or lesions of concern on visible areas     LABS:  Lab Results   Component Value Date/Time    Sodium 140 08/30/2019 01:00 PM    Potassium 5.3 08/30/2019 01:00 PM    Chloride 107 08/30/2019 01:00 PM    CO2 28 08/30/2019 01:00 PM    Anion gap 5 08/30/2019 01:00 PM    Glucose 98 08/30/2019 01:00 PM    BUN 28 (H) 08/30/2019 01:00 PM    Creatinine 1.16 08/30/2019 01:00 PM    BUN/Creatinine ratio 24 (H) 08/30/2019 01:00 PM    GFR est AA 56 (L) 08/30/2019 01:00 PM    GFR est non-AA 46 (L) 08/30/2019 01:00 PM    Calcium 9.2 08/30/2019 01:00 PM       Lab Results   Component  Value Date/Time    Cholesterol, total 170 08/30/2019 01:00 PM    HDL Cholesterol 64 (H) 08/30/2019 01:00 PM    LDL, calculated 86 08/30/2019 01:00 PM    VLDL, calculated 20 08/30/2019 01:00 PM    Triglyceride 100 08/30/2019 01:00 PM    CHOL/HDL Ratio 2.7 08/30/2019 01:00 PM       Lab Results   Component Value Date/Time    WBC 6.1 08/30/2019 01:00 PM    Hemoglobin, POC 11.6 (L) 01/28/2018 02:24 PM    HGB 8.6 (L) 08/30/2019 01:00 PM    Hematocrit, POC 34 (L) 01/28/2018 02:24 PM    HCT 30.3 (L) 08/30/2019 01:00 PM    PLATELET 212 08/30/2019 01:00 PM    MCV 88.9 08/30/2019 01:00 PM       Lab Results   Component Value Date/Time    Hemoglobin A1c 5.8 12/09/2012 10:50 AM       Lab Results   Component Value Date/Time    TSH 2.54 08/30/2019 01:00 PM           Due to this being a TeleHealth  evaluation, many elements of the physical examination are unable to be  assessed.     The pt was seen by synchronous (real-time) audio-video technology, and/or her healthcare decision maker, is aware that this patient-initiated, Telehealth encounter is a billable service, with coverage as determined by her insurance carrier. She is aware that she may receive a bill and has provided verbal consent to proceed: Yes. The patient (or guardian if applicable) is aware that this is a billable service, which includes applicable copays. This virtual visit was conducted with the patient's (and/or legal guardian's) consent. The pt is located in a state where the provider was licensed to provide care.     The pt is being evaluated by a video visit encounter for concerns as above. A caregiver was present when appropriate. Due to this being a Scientist, physiological (During GEXBM-84 public health emergency), evaluation of the following organ systems was limited: Vitals/Constitutional/EENT/Resp/CV/GI/GU/MS/Neuro/Skin/Heme-Lymph-Imm.   Pursuant to the emergency declaration under the [place], 1135 waiver authority and the [employer] Act, this Virtual  Visit was conducted, with patient's (and/or legal guardian's) consent, to reduce the patient's risk of exposure to COVID-19 and provide necessary medical care.     Services were provided through a video synchronous discussion virtually to substitute for in-person clinic visit. Patient and provider were located at their individual home/office respectively.      We discussed the expected course, resolution and complications of the diagnosis(es) in detail.  Medication risks, benefits, costs, interactions, and alternatives were discussed as indicated.  I advised her to contact the office if her condition worsens, changes or fails to improve as anticipated. She expressed understanding with the diagnosis(es) and plan.     Leo Rod, MD

## 2020-06-12 NOTE — Telephone Encounter (Signed)
VV scheduled

## 2020-06-13 LAB — HEMOGLOBIN A1C
Estimated Avg Glucose, External: 122 mg/dL (ref 91–123)
Hemoglobin A1C, External: 5.9 % — ABNORMAL HIGH (ref 4.8–5.6)

## 2020-06-25 ENCOUNTER — Encounter: Attending: Family Medicine | Primary: Family Medicine

## 2020-07-12 NOTE — Telephone Encounter (Signed)
Cathy [name] with [employer] called requesting last office note be faxed to [phone] for oxygen certification.    Printed and faxed

## 2020-08-01 NOTE — Telephone Encounter (Signed)
Imogene with [facility] said pt is scheduled for glaucoma surgery 08/13/20 on her second eye.  Wants to know if her clearance appt on 08/03/20 for leg mass can also cover her for the glaucoma surgery and is it o.k. for pt to have 2 surgeries close together?    She called pt and pt isn't even aware that she had a pre-op appt on Friday and doesn't know anything about reason for it.  Pt seemed very confused she said.  Pt had forgot she was having second eye surgery.  Coralee Pesa stated she has cardiac clearance.    Please advise Coralee Pesa at [phone]

## 2020-08-02 NOTE — Telephone Encounter (Signed)
LVM for pt to confirm her appt tomorrow

## 2020-08-03 ENCOUNTER — Ambulatory Visit: Admit: 2020-08-03 | Discharge: 2020-08-03 | Payer: MEDICARE | Primary: Family Medicine

## 2020-08-03 ENCOUNTER — Inpatient Hospital Stay: Admit: 2020-08-03 | Payer: MEDICARE | Primary: Family Medicine

## 2020-08-03 ENCOUNTER — Ambulatory Visit: Admit: 2020-08-03 | Discharge: 2020-08-03 | Payer: MEDICARE | Attending: Family Medicine | Primary: Family Medicine

## 2020-08-03 ENCOUNTER — Ambulatory Visit: Attending: Family Medicine | Primary: Family Medicine

## 2020-08-03 DIAGNOSIS — N179 Acute kidney failure, unspecified: Secondary | ICD-10-CM

## 2020-08-03 LAB — RENAL FUNCTION PANEL
Albumin: 4.4 g/dL (ref 3.4–5.0)
Albumin: 4.4 g/dL (ref 3.4–5.0)
Anion Gap: 6 mmol/L (ref 3.0–18)
Anion gap: 6 mmol/L (ref 3.0–18)
BUN/Creatinine ratio: 21 — ABNORMAL HIGH (ref 12–20)
BUN: 48 MG/DL — ABNORMAL HIGH (ref 7.0–18)
BUN: 48 MG/DL — ABNORMAL HIGH (ref 7.0–18)
Bun/Cre Ratio: 21 — ABNORMAL HIGH (ref 12–20)
CO2: 24 mmol/L (ref 21–32)
CO2: 24 mmol/L (ref 21–32)
Calcium: 9.9 MG/DL (ref 8.5–10.1)
Calcium: 9.9 MG/DL (ref 8.5–10.1)
Chloride: 109 mmol/L (ref 100–111)
Chloride: 109 mmol/L (ref 100–111)
Creatinine: 2.24 MG/DL — ABNORMAL HIGH (ref 0.6–1.3)
Creatinine: 2.24 MG/DL — ABNORMAL HIGH (ref 0.6–1.3)
EGFR IF NonAfrican American: 22 mL/min/{1.73_m2} — ABNORMAL LOW (ref >60)
GFR African American: 26 mL/min/{1.73_m2} — ABNORMAL LOW (ref >60)
GFR est AA: 26 mL/min/{1.73_m2} — ABNORMAL LOW (ref >60)
GFR est non-AA: 22 mL/min/{1.73_m2} — ABNORMAL LOW (ref >60)
Glucose: 91 mg/dL (ref 74–99)
Glucose: 91 mg/dL (ref 74–99)
Phosphorus: 4.9 MG/DL (ref 2.5–4.9)
Phosphorus: 4.9 MG/DL (ref 2.5–4.9)
Potassium: 5.8 mmol/L — ABNORMAL HIGH (ref 3.5–5.5)
Potassium: 5.8 mmol/L — ABNORMAL HIGH (ref 3.5–5.5)
Sodium: 139 mmol/L (ref 136–145)
Sodium: 139 mmol/L (ref 136–145)

## 2020-08-03 NOTE — Progress Notes (Signed)
 Cathy [name] presents today for   Chief Complaint   Patient presents with   . Pre-op Exam     scheduled for left leg surgery on 08-10-20 w/DR.[facility] p# [phone] f# [phone])            1. Have you been to the ER, urgent care clinic since your last visit?  Hospitalized since your last visit? no    2. Have you seen or consulted any other health care providers outside of the [facility] since your last visit? yes     3. For patients aged 80-75: Has the patient had a colonoscopy / FIT/ Cologuard? Yes - no Care Gap present      If the patient is female:    4. For patients aged 48-74: Has the patient had a mammogram within the past 2 years? Yes - no Care Gap present  See top three    5. For patients aged 21-65: Has the patient had a pap smear? Yes - no Care Gap present

## 2020-08-03 NOTE — Progress Notes (Signed)
[facility]:   Preoperative Evaluation    Date of Exam: 08/08/20    MRN: [id number]    Cathy [name]  Is a 70 y.o. African American female  who presents for preoperative evaluation and management.    Chief Complaint   Patient presents with   ??? Pre-op Exam     scheduled for left leg surgery on 08-10-20 w/DR.Delma Post ([place] p# [phone] f# [phone])        Subjective:   The patient is a??70 year old female with history of hypertension, chronic systolic heart failure??(followed by Dr.McCray) with EF of 10%, mitral regurgitation, cholelithiasis,??asthma,??multinodular goiter, obstructive sleep apnea??(not on rx), allergic rhinitis, osteoporosis, uterine fibroids per EHR, insomnia, cataracts,??dysphagia from achalasia?,??LVAD placement in 2019,??AICD placement,??S/p heart transplant (2020, followed by [facility]),??seizure,??left distal radial artery occlusion (2020),??left Baker's cyst,??Covid-19 (2021),??hyperlipidemia, and meningioma status post resection.    1.  Coronavirus: at her last appointment, she reported a fever with a temperature of 102.  She was subsequently sent to the ED for evaluation.  Labs in the [facility] show that she tested positive for a Coronavirus but it wasn't Coronavirus-19 per her hx.  Her creatinine was 1.7 and her potassium was 5.8 when checked on June 3. She has some persistent fatigue. Her Pox at home is 98% RA.     07/20/20 Chest CT: No significant interval change in the areas of scarring and cavitation in the right lung. No acute cardiopulmonary process     2. LLE Mass:  She has some associated pain along the affected area (lateral tibia area).  She treats pain with tylenol.     LLE??ultrasound??04/13/20:????Solid hypoechoic nodule in the superficial soft tissues of the left lower leg at the area of interest. Imaging features are not specific. Features are not typical for lipoma or cyst and other solid soft tissue masses should be considered to include both benign and  malignant soft tissue neoplasms.??    3. Anemia and CHF: +H/o transplant. Her anemia is stable and in the 10 range per her labs. She states that she was cleared for this procedure by her Cardiology team. She met with her Cardiology team in June.     4. AKI: Her labs from 07/27/20 showed an elevated BUN and creatinine of 1.7. Her potassium was also high. No urinary sx.     General Information:  Procedure/Surgery:LLE surgery  Date of Procedure/Surgery: 08/10/20  Surgeon:  Jeannett Senior  Primary Physician: Azzie Almas, MD  Surgery status: Elective  Surgery risk: Intermediate (head/neck, intraperitoneal, intrathoracic, orthopedic, and prostate    Cardiovascular Risk Factors:  1. Coronary revascularization within 5 years: yes  2. Recurrent chest pain: no  3. Shortness of breath:  yes - but it is her baseline.  4. Recent coronary evaluation/stress test/angiogram:  yes; 10/20/19 was her last cardiac catheterization per review of the [facility] EHR  5. Recent MI (less than 1 month ago):  no  6. Prior MI (by way of history or pathological waves):  no  7. Compensated CHF or h/o CHF:  yes  8. Severe valvular disease:  no  9. Decompensated CHF:  no  10. High-grade atrioventricular block:  no  11. Arrhythmia:  no.  He has a history of nonischemic cardiomyopathy prior to having a cardiac transplant.  She had an AICD placed as a result.    06/14/20 Echocardiogram:Heart replaced by transplant.  Left ventricular chamber size is normal.  Left ventricular systolic function is normal with an ejection fraction of  68 % by  Aikens's biplane. There is mild concentric left ventricular hypertrophy.  Left ventricular diastolic function: normal. Right ventricular base is enlarged with normal systolic function. FAC is  49%. No significant valve abnormalities.  No pulmonary hypertension, estimated pulmonary arterial systolic pressure  is 32 mmHg.  Comparison: No significant change since prior study from 11/15/19.      Other Risk Factors:  1. Diabetes  hx:  no  2. H/o CVA:  no  3. Uncontrolled hypertension:  no  4, Advanced age:  yes  5. Low functional capacity:  no  6. Recent use of: No recent use of aspirin (ASA), NSAIDS or steroids  7. Tetanus up to date: tetanus re-vaccination not indicated  8. Anesthesia Complications: None  9. History of abnormal bleeding : None  10. History of Blood Transfusions:  Unknown  11. Health Care Directive or Living Will: yes  12. Latex Allergy: no      Problem List:     Patient Active Problem List    Diagnosis Date Noted   ??? Abnormal MRI 04/30/2020   ??? Depression 08/30/2019   ??? History of seizures 08/30/2019   ??? Mild persistent asthma without complication 62/13/0865   ??? Anemia 08/30/2019   ??? Thyroid nodule 08/30/2019   ??? Esophageal dysphagia 03/17/2017   ??? Age-related osteoporosis without current pathological fracture 03/04/2016   ??? Multinodular goiter 03/04/2016   ??? History of meningioma 03/04/2016   ??? Non-rheumatic mitral regurgitation 04/25/2014   ??? AR (allergic rhinitis) 09/29/2011   ??? Chronic systolic heart failure ([place])    ??? Mixed hyperlipidemia    ??? AICD (automatic cardioverter/defibrillator) present 11/15/2010   ??? Cataract 12/01/2008   ??? Gallstones 11/13/2008   ??? OSA (obstructive sleep apnea) 11/13/2008   ??? Fibroid 06/23/2008   ??? HTN (hypertension) 06/23/2008     Medical History:     Past Medical History:   Diagnosis Date   ??? Age related osteoporosis    ??? Automatic implantable cardiac defibrillator in situ     Post ddd icd Set up carelink   ??? Baker cyst, left 12/2016   ??? Breast mass, left     benign   ??? CHF (congestive heart failure) ([place]) 06/23/2008    Non-ischemic CMP , Cath (2008) Normal coronary arteries   ??? Chronic systolic heart failure ([facility])     Stable,    ??? Degenerative arthritis of left knee    ??? Fibroid 06/23/2008   ??? History of brain tumor 2013   ??? HLD (hyperlipidemia)    ??? HTN (hypertension) 06/23/2008   ??? Hypotension, unspecified     Related to lisinopril   ??? 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 Left knee pain     With swelling   ??? Leg pain    ??? LVAD (left ventricular assist device) present ([place]) 03/2017   ??? 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 OSA (obstructive sleep apnea) 9/10   ??? Venous reflux    ??? Wears glasses      Allergies:     Allergies   Allergen Reactions   ??? Penicillamine Itching   ??? Penicillins Rash and Itching     Itching bumps on hands and arms  Itching bumps on hands and arms  Itching bumps on hands and arms  Has patient had a PCN reaction causing immediate rash, facial/tongue/throat swelling, SOB or lightheadedness with hypotension: Yes  Has patient  had a PCN reaction causing severe rash involving mucus membranes or skin necrosis: No  Has patient had a PCN reaction that required hospitalization unknown  Has patient had a PCN reaction occurring within the last 10 years: No  If all of the above answers are "NO", then may proceed with Cephalosporin use.    Itching bumps on hands and arms   ??? Tramadol Other (comments)     Psychotic Hallucinations  Psychotic Hallucinations  hallucinations  Psychotic Hallucinations   ??? Chlorhexidine Towelette Itching     CHG Wipes   ??? Copper Itching      Medications:     Current Outpatient Medications   Medication Sig   ??? dorzolamide (TRUSOPT) 2 % ophthalmic solution    ??? brimonidine (ALPHAGAN) 0.2 % ophthalmic solution    ??? prednisoLONE acetate (PRED FORTE) 1 % ophthalmic suspension    ??? amitriptyline (ELAVIL) 25 mg tablet    ??? latanoprost (XALATAN) 0.005 % ophthalmic solution    ??? fluticasone furoate-vilanteroL (Breo Ellipta) 200-25 mcg/dose inhaler Take 1 Puff by inhalation daily.   ??? acetaminophen (TylenoL) 325 mg tablet Take  by mouth every four (4) hours as needed for Pain.   ??? tacrolimus (PROGRAF) 1 mg capsule Take 8 mg every morning, take 7 mg every evening   ??? ondansetron (ZOFRAN ODT) 8 mg disintegrating tablet Take 8 mg by mouth.   ??? melatonin 3 mg tablet Take 3 mg by mouth.   ??? docusate sodium (COLACE)  100 mg capsule Take 100 mg by mouth daily.   ??? diclofenac (VOLTAREN) 1 % gel Apply 2 g to affected area four (4) times daily.   ??? atorvastatin (LIPITOR) 40 mg tablet Take 40 mg by mouth daily.   ??? loperamide (IMODIUM) 2 mg capsule    ??? omeprazole (PRILOSEC) 20 mg capsule    ??? mycophenolate mofetil (CELLCEPT) 250 mg capsule Take 1 Capsule by mouth two (2) times a day. (Patient not taking: Reported on 08/03/2020)   ??? lidocaine 4 % patch 1 Patch by TransDERmal route daily. (Patient not taking: Reported on 08/03/2020)     No current facility-administered medications for this visit.     Surgical History:     Past Surgical History:   Procedure Laterality Date   ??? FULL ESOPHAGEAL MANOMETRY  02/16/2017        ??? HX BREAST BIOPSY      rt breast, benign   ??? HX CHOLECYSTECTOMY     ??? HX CRANIOTOMY  2013    meningioma    ??? HX HEENT      glasses   ??? HX HYSTERECTOMY     ??? HX IMPLANTABLE CARDIOVERTER DEFIBRILLATOR     ??? HX PACEMAKER  2013    dual chamber icd   ??? HX TONSIL AND ADENOIDECTOMY     ??? HX TOTAL ABDOMINAL HYSTERECTOMY  1996   ??? HX TUBAL LIGATION       Social History:     Social History     Socioeconomic History   ??? Marital status: MARRIED   Occupational History   ??? Occupation: home health aide   Tobacco Use   ??? Smoking status: Never Smoker   ??? Smokeless tobacco: Never Used   Substance and Sexual Activity   ??? Alcohol use: No   ??? Drug use: No   ??? Sexual activity: Yes     Partners: Male     Birth control/protection: Condom         REVIEW OF  SYSTEMS:  Review of Systems   Constitutional: Negative for chills and fever.   HENT: Negative for ear pain and sore throat.    Eyes: Negative for blurred vision and pain.   Respiratory: Negative for cough and shortness of breath.    Cardiovascular: Negative for chest pain.   Gastrointestinal: Negative for abdominal pain, blood in stool and melena.   Genitourinary: Negative for dysuria and hematuria.   Musculoskeletal: Positive for joint pain. Negative for myalgias.   Skin: Negative for rash.    Neurological: Negative for headaches.   Endo/Heme/Allergies: Does not bruise/bleed easily.   Psychiatric/Behavioral: Negative for substance abuse.       Objective:     Vitals:    08/03/20 1227   BP: 130/74   Resp: 14   Temp: 97.4 ??F (36.3 ??C)   TempSrc: Temporal   Weight: 129 lb (58.5 kg)   Height: '5\' 10"'  (1.778 m)   PainSc:   0 - No pain       Physical Exam  Vitals and nursing note reviewed.   HENT:      Head: Normocephalic and atraumatic.      Right Ear: External ear normal.      Left Ear: External ear normal.   Eyes:      General: No scleral icterus.        Right eye: No discharge.         Left eye: No discharge.      Conjunctiva/sclera: Conjunctivae normal.   Cardiovascular:      Rate and Rhythm: Normal rate and regular rhythm.      Heart sounds: No friction rub. No gallop.    Pulmonary:      Effort: Pulmonary effort is normal. No respiratory distress.      Breath sounds: Normal breath sounds. No wheezing or rales.   Abdominal:      General: Bowel sounds are normal. There is no distension.      Palpations: Abdomen is soft. There is no mass.      Tenderness: There is no abdominal tenderness. There is no guarding or rebound.   Musculoskeletal:         General: No swelling (BUE) or tenderness (BUE).      Cervical back: Neck supple.      Comments: +LLE with lateral knee nodule, TTP but w/o erythema   Lymphadenopathy:      Cervical: No cervical adenopathy.   Skin:     General: Skin is warm and dry.      Findings: No erythema or rash.   Neurological:      Mental Status: She is alert.      Motor: No abnormal muscle tone.      Gait: Gait normal.   Psychiatric:         Mood and Affect: Mood normal.         DIAGNOSTICS:   Lab Results   Component Value Date/Time    Sodium 139 08/03/2020 01:00 PM    Potassium 5.8 (H) 08/03/2020 01:00 PM    Chloride 109 08/03/2020 01:00 PM    CO2 24 08/03/2020 01:00 PM    Anion gap 6 08/03/2020 01:00 PM    Glucose 91 08/03/2020 01:00 PM    BUN 48 (H) 08/03/2020 01:00 PM    Creatinine 2.24  (H) 08/03/2020 01:00 PM    BUN/Creatinine ratio 21 (H) 08/03/2020 01:00 PM    GFR est AA 26 (L) 08/03/2020 01:00 PM    GFR est non-AA  22 (L) 08/03/2020 01:00 PM    Calcium 9.9 08/03/2020 01:00 PM    Bilirubin, total 0.3 08/30/2019 01:00 PM    Alk. phosphatase 177 (H) 08/30/2019 01:00 PM    Protein, total 7.5 08/30/2019 01:00 PM    Albumin 4.4 08/03/2020 01:00 PM    Globulin 3.6 08/30/2019 01:00 PM    A-G Ratio 1.1 08/30/2019 01:00 PM    ALT (SGPT) 24 08/30/2019 01:00 PM    AST (SGOT) 14 08/30/2019 01:00 PM     Lab Results   Component Value Date/Time    WBC 6.1 08/30/2019 01:00 PM    Hemoglobin, POC 11.6 (L) 01/28/2018 02:24 PM    HGB 8.6 (L) 08/30/2019 01:00 PM    Hematocrit, POC 34 (L) 01/28/2018 02:24 PM    HCT 30.3 (L) 08/30/2019 01:00 PM    PLATELET 212 08/30/2019 01:00 PM    MCV 88.9 08/30/2019 01:00 PM         Assessment/Plan:   1. AKI: She is dehydrated per last renal panel. +Hyperkalemia.  - Checking a f/u renal panel    2. CHF and Anemia: Stable.  - C/w rx per her Cardiac Transplant team.    3. Coronavirus Infection: Resolved. PE findings are reassuring. Observation.    4. LLE Nodule/Mass:   Preoperative Assessment: No contraindications to planned surgery unless her AKI worsens and her hyperkalemia does not resolve when rechecked as mentioned above  Orders/studies that need to be obtained prior to surgical clearance: medical clearance has not yet been obtained    Pt is to undergo an intermediate risk procedure with a low cardiac risk based on current history. Labs and imaging within acceptable range. No contraindications to planned surgery. Discontinue NSAIDS 1 week prior to surgical procedure. Follow up as scheduled post operatively.     I have discussed the plan of care with the patient. The patient has received an after-visit summary and questions were answered concerning future plans.  I have discussed medication side effects and warnings with the patient as well. All questions were answered. The  patient understands the plan of care. Handouts provided today with the above information. Pt instructed if symptoms worsen to call the office or report to the ED for continued care.  Greater than 50% of the visit time was spent in counseling and/or coordination of care.      Dr. Marius Ditch  [facility], [place] [address]  Phone: [phone]  Fax: [phone]

## 2020-08-03 NOTE — Progress Notes (Signed)
[facility]:  08/09/20, [id number]      The Subsequent Medicare Annual Wellness Exam PROGRESS NOTE    This is a Subsequent Medicare Annual Wellness Exam (AWV).    I have reviewed the patient's medical history in detail and updated the computerized patient record.     Cathy [name] is a 70 y.o. African American female and presents for an annual wellness exam.    SUBJECTIVE    Past Medical History:   Diagnosis Date   ??? Age related osteoporosis    ??? Automatic implantable cardiac defibrillator in situ     Post ddd icd Set up carelink   ??? Baker cyst, left 12/2016   ??? Breast mass, left     benign   ??? CHF (congestive heart failure) ([place]) 06/23/2008    Non-ischemic CMP , Cath (2008) Normal coronary arteries   ??? Chronic systolic heart failure (HCC)     Stable,    ??? Degenerative arthritis of left knee    ??? Fibroid 06/23/2008   ??? History of brain tumor 2013   ??? HLD (hyperlipidemia)    ??? HTN (hypertension) 06/23/2008   ??? Hypotension, unspecified     Related to lisinopril   ??? 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 Left knee pain     With swelling   ??? Leg pain    ??? LVAD (left ventricular assist device) present ([facility]) 03/2017   ??? 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 OSA (obstructive sleep apnea) 9/10   ??? Venous reflux    ??? Wears glasses       Past Surgical History:   Procedure Laterality Date   ??? FULL ESOPHAGEAL MANOMETRY  02/16/2017        ??? HX BREAST BIOPSY      rt breast, benign   ??? HX CHOLECYSTECTOMY     ??? HX CRANIOTOMY  2013    meningioma    ??? HX HEENT      glasses   ??? HX HYSTERECTOMY     ??? HX IMPLANTABLE CARDIOVERTER DEFIBRILLATOR     ??? HX PACEMAKER  2013    dual chamber icd   ??? HX TONSIL AND ADENOIDECTOMY     ??? HX TOTAL ABDOMINAL HYSTERECTOMY  1996   ??? HX TUBAL LIGATION       Current Outpatient Medications   Medication Sig Dispense Refill   ??? dorzolamide (TRUSOPT) 2 % ophthalmic solution      ??? brimonidine (ALPHAGAN) 0.2 % ophthalmic solution       ??? prednisoLONE acetate (PRED FORTE) 1 % ophthalmic suspension      ??? amitriptyline (ELAVIL) 25 mg tablet      ??? latanoprost (XALATAN) 0.005 % ophthalmic solution      ??? fluticasone furoate-vilanteroL (Breo Ellipta) 200-25 mcg/dose inhaler Take 1 Puff by inhalation daily.     ??? acetaminophen (TylenoL) 325 mg tablet Take  by mouth every four (4) hours as needed for Pain.     ??? tacrolimus (PROGRAF) 1 mg capsule Take 8 mg every morning, take 7 mg every evening     ??? ondansetron (ZOFRAN ODT) 8 mg disintegrating tablet Take 8 mg by mouth.     ??? melatonin 3 mg tablet Take 3 mg by mouth.     ??? docusate sodium (COLACE) 100 mg capsule Take 100 mg by mouth daily.     ??? diclofenac (VOLTAREN) 1 %  gel Apply 2 g to affected area four (4) times daily.     ??? atorvastatin (LIPITOR) 40 mg tablet Take 40 mg by mouth daily.     ??? loperamide (IMODIUM) 2 mg capsule      ??? omeprazole (PRILOSEC) 20 mg capsule      ??? mycophenolate mofetil (CELLCEPT) 250 mg capsule Take 1 Capsule by mouth two (2) times a day. (Patient not taking: Reported on 08/03/2020) 60 Capsule 5   ??? lidocaine 4 % patch 1 Patch by TransDERmal route daily. (Patient not taking: Reported on 08/03/2020)       Allergies   Allergen Reactions   ??? Penicillamine Itching   ??? Penicillins Rash and Itching     Itching bumps on hands and arms  Itching bumps on hands and arms  Itching bumps on hands and arms  Has patient had a PCN reaction causing immediate rash, facial/tongue/throat swelling, SOB or lightheadedness with hypotension: Yes  Has patient had a PCN reaction causing severe rash involving mucus membranes or skin necrosis: No  Has patient had a PCN reaction that required hospitalization unknown  Has patient had a PCN reaction occurring within the last 10 years: No  If all of the above answers are "NO", then may proceed with Cephalosporin use.    Itching bumps on hands and arms   ??? Tramadol Other (comments)     Psychotic Hallucinations  Psychotic  Hallucinations  hallucinations  Psychotic Hallucinations   ??? Chlorhexidine Towelette Itching     CHG Wipes   ??? Copper Itching     Family History   Problem Relation Age of Onset   ??? Diabetes Mother    ??? Heart Attack Father         MI   ??? Heart Disease Maternal Grandmother    ??? Heart Disease Paternal Grandmother    ??? Kidney Disease Other         1 sib deceased   ??? Cancer Other         1 sib throat cancer   ??? Diabetes Other    ??? Diabetes Daughter    ??? Other Daughter         leukemia   ??? Hypertension Other      Social History     Tobacco Use   ??? Smoking status: Never Smoker   ??? Smokeless tobacco: Never Used   Substance Use Topics   ??? Alcohol use: No     Patient Active Problem List   Diagnosis Code   ??? Fibroid D21.9   ??? HTN (hypertension) I10   ??? Gallstones K80.20   ??? OSA (obstructive sleep apnea) G47.33   ??? Cataract H26.9   ??? AICD (automatic cardioverter/defibrillator) present Z95.810   ??? Chronic systolic heart failure (HCC) I50.22   ??? Mixed hyperlipidemia E78.2   ??? AR (allergic rhinitis) J30.9   ??? Non-rheumatic mitral regurgitation I34.0   ??? Age-related osteoporosis without current pathological fracture M81.0   ??? Multinodular goiter E04.2   ??? History of meningioma Z86.018   ??? Esophageal dysphagia R13.19   ??? Depression F32.A   ??? History of seizures Z87.898   ??? Mild persistent asthma without complication Z61.09   ??? Anemia D64.9   ??? Thyroid nodule E04.1   ??? 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    Health Maintenance History  Immunizations reviewed:   Tdap over-due   Pneumovax: up to date   Flu: flu season is over  Zoster: over-due    Immunization History   Administered Date(s) Administered   ??? COVID-19, Pfizer Purple top, DILUTE for use, 12+ yrs, 35mcg/0.3mL dose 04/25/2019, 05/12/2019   ??? Hep B Vaccine (Adult) 11/12/2017, 08/05/2018, 10/05/2018   ??? Hep B, Adol/Ped 05/14/2017, 06/10/2017   ??? Hib (HbOC) 09/23/2017   ??? Influenza High Dose Vaccine PF 02/06/2016   ??? Influenza Vaccine 11/27/2008, 12/19/2009, 11/11/2011, 01/27/2013, 01/27/2013, 02/27/2014, 02/06/2016, 11/12/2016, 11/12/2017, 12/08/2017   ??? Influenza Vaccine [employer]) PF (>6 Mo Flulaval, Fluarix, and >3 Yrs Afluria, Fluzone [phone]) 02/27/2014   ??? Influenza Vaccine (Tri) Adjuvanted (>65 Yrs FLUAD TRI [id number]) 11/12/2016   ??? Influenza Vaccine PF 11/11/2011   ??? Influenza Vaccine Split 11/27/2008, 12/19/2009   ??? Influenza, Quadrivalent, Adjuvanted (>65 Yrs FLUAD QUAD [id number]) 01/30/2020   ??? Pneumococcal Conjugate (PCV-13) 04/25/2015, 08/05/2018, 10/05/2018   ??? Pneumococcal Polysaccharide (PPSV-23) 04/25/2015, 11/27/2016   ??? TB Skin Test (PPD) 03/31/2017       Colonoscopy: Up to date. No bleeding.     Eye exam: Up to date.     Mammo: Up to date.     Dexascan: Up to date.     Pelvic/Pap: Asymptomatic.       Review of Systems   Constitutional: Negative for chills and fever.   HENT: Negative for ear pain and sore throat.    Eyes: Negative for blurred vision and pain.   Respiratory: Negative for cough and shortness of breath.    Cardiovascular: Negative for chest pain.   Gastrointestinal: Negative for abdominal pain, blood in stool and melena.   Genitourinary: Negative for dysuria and hematuria.   Musculoskeletal: Positive for joint pain. Negative for myalgias.   Skin: Negative for rash.   Neurological: Negative for headaches.   Endo/Heme/Allergies: Does not bruise/bleed easily.    Psychiatric/Behavioral: Negative for substance abuse.       Depression Risk Factor Screening:      Patient Health Questionnaire (PHQ-2)   Over the last 2 weeks, how often have you been bothered by any of the following problems?  ?? Little interest or pleasure in doing things?  ?? Not at all. [0]  ?? Feeling down, depressed, or hopeless?   ?? Not at all. [0]    Total Score: 0/6  PHQ-2 Assessment Scoring:   A score of 2 or more requires further screening with the PHQ-9    Alcohol Risk Factor Screening:   Women:   On any occasion during the past 3 months, have you had more than 3 drinks containing alcohol? no    Do you average more than 7 drinks per week? no    Tobacco Use Screening:     Social History     Tobacco Use   Smoking Status Never Smoker   Smokeless Tobacco Never Used       Hearing Loss   There have been no changes to the pt's hearing. No additional studies/evaluation is warranted at this time    Activities of Daily Living   Self-care.   She does not need help with ADLs/IADLs  Requires assistance with: no ADLs    Fall Risk   No falls w/I  the past year.    Abuse Screen   None    Additional Examination Findings:  Vitals:    08/03/20 1227   BP: 130/74   Resp: 14   Temp: 97.4 ??F (36.3 ??C)   TempSrc: Temporal   Weight: 129 lb (58.5 kg)   Height: 5\' 10"  (1.778 m)   PainSc:   0 - No pain      Body mass index is 18.51 kg/m??.     Evaluation of Cognitive Function:  Mood/affect: Euthymic  Appearance: Well-groomed  Family member/caregiver input: She is not with a family member today      General:   Well-nourished, well-groomed, pleasant, alert, in no acute distress.     Head:  Normocephalic, atraumatic  Ears:  External ears WNL  Eyes:  Clear sclera  Neuro:   Alert, conversant, appropriate, following commands  Skin:    No rashes noted  Psych: Affect, mood and judgment appropriate      Dementia Screen:  Clock Drawing (ten past eleven) Exercise: Unremarkable      LABS   Data Review:   Lab Results   Component Value Date/Time     Sodium 139 08/03/2020 01:00 PM    Potassium 5.8 (H) 08/03/2020 01:00 PM    Chloride 109 08/03/2020 01:00 PM    CO2 24 08/03/2020 01:00 PM    Anion gap 6 08/03/2020 01:00 PM    Glucose 91 08/03/2020 01:00 PM    BUN 48 (H) 08/03/2020 01:00 PM    Creatinine 2.24 (H) 08/03/2020 01:00 PM    BUN/Creatinine ratio 21 (H) 08/03/2020 01:00 PM    GFR est AA 26 (L) 08/03/2020 01:00 PM    GFR est non-AA 22 (L) 08/03/2020 01:00 PM    Calcium 9.9 08/03/2020 01:00 PM       Lab Results   Component Value Date/Time    WBC 6.1 08/30/2019 01:00 PM    Hemoglobin, POC 11.6 (L) 01/28/2018 02:24 PM    HGB 8.6 (L) 08/30/2019 01:00 PM    Hematocrit, POC 34 (L) 01/28/2018 02:24 PM    HCT 30.3 (L) 08/30/2019 01:00 PM    PLATELET 212 08/30/2019 01:00 PM    MCV 88.9 08/30/2019 01:00 PM       Lab Results   Component Value Date/Time    Hemoglobin A1c 5.8 12/09/2012 10:50 AM       Lab Results   Component Value Date/Time    Cholesterol, total 170 08/30/2019 01:00 PM    HDL Cholesterol 64 (H) 08/30/2019 01:00 PM    LDL, calculated 86 08/30/2019 01:00 PM    VLDL, calculated 20 08/30/2019 01:00 PM    Triglyceride 100 08/30/2019 01:00 PM    CHOL/HDL Ratio 2.7 08/30/2019 01:00 PM         Patient Care Team:  Leo Rod, MD as PCP - General (Family Medicine)  Leo Rod, MD as PCP - [facility] Empaneled Provider  Kerrin Champagne, MD (Ophthalmology)  Tona Sensing, MD (Cardiovascular Disease Physician)    End-of-life planning  Advanced Directive in the case than an injury or illness causes the patient to be unable to make health care decisions was discussed with the patient.     Advice/Referrals/Counselling/Plan:   Education and counseling provided:  Are appropriate based on today's review and evaluation  End-of-Life planning (with patient's consent)  Pneumococcal Vaccine  Influenza Vaccine  Screening Mammography  Screening Pap and pelvic (covered once every 2 years)  Colorectal cancer screening tests  Cardiovascular  screening blood test  Bone  mass measurement (DEXA)  Screening for glaucoma  Diabetes screening test  Include in education list (weight loss, physical activity, smoking cessation, fall prevention, and nutrition)    ICD-10-CM ICD-9-CM    1. AKI (acute kidney injury) ([place])  N17.9 584.9 RENAL FUNCTION PANEL   2. Encounter for screening mammogram for breast cancer  Z12.31 V76.12 MAM 3D TOMO W MAMMO BI SCREENING INCL CAD   3. Medicare annual wellness visit, subsequent  Z00.00 V70.0    4. Chronic systolic heart failure (HCC)  I50.22 428.22    5. Leg mass, left  R22.42 782.2    6. Coronavirus infection  B34.2 079.89      reviewed diet, exercise and weight control.  Brief written plan, checklist    Assessment/Plan:    Health Maintenance:  - Mammogram ordered    ORDERS:  - MAM 3D TOMO W MAMMO BI SCREENING INCL CAD; Future        Lab review: labs are reviewed in the [facility] (ACP) Provider Conversation     Date of ACP Conversation: 07/1020  Persons included in Conversation:  patient    Authorized Decision Maker (if patient is incapable of making informed decisions):   This person is:   Pharmacist, community in [employer], retail          For Patients with Decision Making Capacity:   Values/Goals: Exploration of values, goals, and preferences if recovery is not expected, even with continued medical treatment in the event of:  Imminent death  Severe, permanent brain injury    Conversation Outcomes / Follow-Up Plan:   ACP complete - no further action today. She has no changes to make to her preexisting advance directive        I have discussed the diagnosis with the patient and the intended plan as seen in the above orders.  The patient has received an after-visit summary and questions were answered concerning future plans.  I have discussed medication side effects and warnings with the patient as well.I have reviewed the plan of care with the patient, accepted their input and they are in agreement  with the treatment goals.     Follow-up and Dispositions    ?? Return in about 2 months (around 10/03/2020).

## 2020-08-03 NOTE — ACP (Advance Care Planning) (Signed)
Advance Care Planning  Advance Care Planning (ACP) Provider Conversation     Date of ACP Conversation: 07/1020  Persons included in Conversation:  patient    Authorized Decision Maker (if patient is incapable of making informed decisions):   This person is:   Pharmacist, community in [employer], retail          For Patients with Decision Making Capacity:   Values/Goals: Exploration of values, goals, and preferences if recovery is not expected, even with continued medical treatment in the event of:  Imminent death  Severe, permanent brain injury    Conversation Outcomes / Follow-Up Plan:   ACP complete - no further action today. She has no changes to make to her preexisting advance directive    Dr. Marius Ditch  [facility], [place] [address]  Phone: [phone]  Fax: [phone]

## 2020-08-08 NOTE — Telephone Encounter (Signed)
Cathy [name] with [name] calling for pre-op note, surgery is Friday.

## 2020-08-09 NOTE — Telephone Encounter (Signed)
Patient stating Dr. Cornelia Copa is supposed to be calling her today and wanted her to know that she can best be reached at [phone]

## 2020-08-09 NOTE — Telephone Encounter (Signed)
Please let her know that I am recommending that she postpone her procedure tomorrow until her potassium and kidney function have normalized.  Please fax these results to her cardiac transplant team for further instructions on how to correct these abnormalities.    Dr. Marius Ditch  [facility], [place] [address]  Phone: [phone]  Fax: [phone]

## 2020-08-09 NOTE — Telephone Encounter (Signed)
LVM for patient to return our call to give message from provider. Results faxed to DR.Lynann Bologna with confirmation [phone].

## 2020-08-09 NOTE — Progress Notes (Signed)
Faxed pre op to [facility]

## 2020-08-10 NOTE — Telephone Encounter (Signed)
-  Office note and lab results have been faxed to [facility] -

## 2020-08-10 NOTE — Telephone Encounter (Signed)
Note has been faxed but clearance note doesn't mention eye surgery so may need to be addended

## 2020-08-10 NOTE — Telephone Encounter (Signed)
[facility] called asking for pt medical clearance to be faxed to them , pt will be having surgery on 06/20   Fax# [phone]

## 2020-09-21 ENCOUNTER — Encounter

## 2020-09-26 ENCOUNTER — Ambulatory Visit: Admit: 2020-09-26 | Discharge: 2020-09-26 | Payer: MEDICARE | Attending: Family Medicine | Primary: Family Medicine

## 2020-09-26 ENCOUNTER — Ambulatory Visit: Attending: Family Medicine | Primary: Family Medicine

## 2020-09-26 DIAGNOSIS — N179 Acute kidney failure, unspecified: Secondary | ICD-10-CM

## 2020-09-26 NOTE — Progress Notes (Signed)
 Brieonna Crutcher presents today for   Chief Complaint   Patient presents with    Pre-op Exam     scheduled for surgery Left leg mass excision on 10-01-20 at [facility] w/DR. Balsamo([facility]) p# [id number] f# [id number]           1. Have you been to the ER, urgent care clinic since your last visit?  Hospitalized since your last visit? yes    2. Have you seen or consulted any other health care providers outside of the [facility] since your last visit? yes     3. For patients aged 97-75: Has the patient had a colonoscopy / FIT/ Cologuard? Yes - no Care Gap present      If the patient is female:    4. For patients aged 79-74: Has the patient had a mammogram within the past 2 years? Yes - no Care Gap present  See top three    5. For patients aged 21-65: Has the patient had a pap smear? Yes - no Care Gap present

## 2020-09-26 NOTE — Progress Notes (Signed)
[facility]:   Preoperative Evaluation    Date of Exam: 09/26/20    MRN: [id number]    Cathy [name]  Is a 70 y.o. African American female  who presents for preoperative evaluation and management.     Chief Complaint   Patient presents with    Pre-op Exam     scheduled for surgery Left leg mass excision on 10-01-20 at [facility] w/DR. Balsamo([facility]) p# [id number] f# [phone]         Subjective:   The patient is a 70 year old female with history of hypertension, chronic systolic heart failure (followed by Dr.McCray) with EF of 10%, mitral regurgitation, cholelithiasis, asthma, multinodular goiter, obstructive sleep apnea (not on rx), allergic rhinitis, osteoporosis, uterine fibroids per EHR, insomnia, cataracts, dysphagia from achalasia?, LVAD placement in 2019, AICD placement, S/p heart transplant (2020, followed by [facility]), seizure, left distal radial artery occlusion (2020), left Baker's cyst, Covid-19 (2021), hyperlipidemia, and meningioma status post resection.    1. AKI: Her surgery was previously postponed due to an elevated potassium and acute kidney injury.  Follow-up labs thereafter showed a normal creatinine and potassium.  She has no urinary symptoms today.  She has preoperative labs that she has yet to get done.  She plans to have them done today.    2. LLE Mass: Thought be secondary to a schwannoma.  It is along her lateral tibia area and is causing her a great deal of pain.  She takes Tylenol at night so she can sleep.  The pain can keep her up at night. It is beginning to spread to her anterior tibia area.     3.  CHF: Status post cardiac transplant.  Blood pressure is 122/64.  She is on tacrolimus, CellCept, and Lipitor.  No fever or cough.    LLE ultrasound 04/13/20:  Solid hypoechoic nodule in the superficial soft tissues of the left lower leg at the area of interest. Imaging features are not specific. Features are not typical for lipoma or cyst and  other solid soft tissue masses should be considered to include both benign and malignant soft tissue neoplasms.     General Information:  Procedure/Surgery:LLE surgery  Date of Procedure/Surgery: 10/01/20  Surgeon:  Jeannett Senior  Primary Physician: Azzie Almas, MD  Surgery status: Elective  Surgery risk: Intermediate (head/neck, intraperitoneal, intrathoracic, orthopedic, and prostate     Cardiovascular Risk Factors:  1. Coronary revascularization within 5 years: yes  2. Recurrent chest pain: no  3. Shortness of breath:  yes - but it is her baseline.  4. Recent coronary evaluation/stress test/angiogram:  yes; 10/20/19 was her last cardiac catheterization per review of the [facility] EHR  5. Recent MI (less than 1 month ago):  no  6. Prior MI (by way of history or pathological waves):  no  7. Compensated CHF or h/o CHF:  yes  8. Severe valvular disease:  no  9. Decompensated CHF:  no  10. High-grade atrioventricular block:  no  11. Arrhythmia:  no.  He has a history of nonischemic cardiomyopathy prior to having a cardiac transplant.  She had an AICD placed as a result.     06/14/20 Echocardiogram:Heart replaced by transplant.  Left ventricular chamber size is normal.  Left ventricular systolic function is normal with an ejection fraction of  68 % by Hemp's biplane. There is mild concentric left ventricular hypertrophy.  Left ventricular diastolic function: normal. Right ventricular base is enlarged with normal systolic function. FAC is  49%. No significant valve abnormalities.  No pulmonary hypertension, estimated pulmonary arterial systolic pressure  is 32 mmHg.  Comparison: No significant change since prior study from 11/15/19.        Other Risk Factors:  1. Diabetes hx:  no  2. H/o CVA:  no  3. Uncontrolled hypertension:  no  4, Advanced age:  yes  5. Low functional capacity:  no  6. Recent use of: No recent use of aspirin (ASA), NSAIDS or steroids  7. Tetanus up to date: tetanus re-vaccination not indicated  8.  Anesthesia Complications: None  9. History of abnormal bleeding : None - no recent vaginal bleeding, rectal bleeding, or hematuria.   10. History of Blood Transfusions:  Yes  11. Health Care Directive or Living Will: yes  12. Latex Allergy: no    Problem List:     Patient Active Problem List    Diagnosis Date Noted    Abnormal MRI 04/30/2020    Depression 08/30/2019    History of seizures 08/30/2019    Mild persistent asthma without complication 30/16/0109    Anemia 08/30/2019    Thyroid nodule 08/30/2019    Esophageal dysphagia 03/17/2017    Age-related osteoporosis without current pathological fracture 03/04/2016    Multinodular goiter 03/04/2016    History of meningioma 03/04/2016    Non-rheumatic mitral regurgitation 04/25/2014    AR (allergic rhinitis) 09/29/2011    Chronic systolic heart failure ([place])     Mixed hyperlipidemia     AICD (automatic cardioverter/defibrillator) present 11/15/2010    Cataract 12/01/2008    Gallstones 11/13/2008    OSA (obstructive sleep apnea) 11/13/2008    Fibroid 06/23/2008    HTN (hypertension) 06/23/2008     Medical History:     Past Medical History:   Diagnosis Date    Age related osteoporosis     Automatic implantable cardiac defibrillator in situ     Post ddd icd Set up carelink    Baker cyst, left 12/2016    Breast mass, left     benign    CHF (congestive heart failure) ([place]) 06/23/2008    Non-ischemic CMP , Cath (2008) Normal coronary arteries    Chronic systolic heart failure ([facility])     Stable,     Degenerative arthritis of left knee     Fibroid 06/23/2008    History of brain tumor 2013    HLD (hyperlipidemia)     HTN (hypertension) 06/23/2008    Hypotension, unspecified     Related to lisinopril    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    radioactive iodine ablation of thyroid    Left knee pain     With swelling    Leg pain     LVAD (left ventricular assist device) present ([place]) 03/2017    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     OSA (obstructive sleep apnea)  9/10    Venous reflux     Wears glasses      Allergies:     Allergies   Allergen Reactions    Penicillamine Itching    Penicillins Rash and Itching     Itching bumps on hands and arms  Itching bumps on hands and arms  Itching bumps on hands and arms  Has patient had a PCN reaction causing immediate rash, facial/tongue/throat swelling, SOB or lightheadedness with hypotension: Yes  Has patient had a PCN reaction causing severe rash involving mucus membranes or skin necrosis: No  Has patient  had a PCN reaction that required hospitalization unknown  Has patient had a PCN reaction occurring within the last 10 years: No  If all of the above answers are "NO", then may proceed with Cephalosporin use.    Itching bumps on hands and arms    Tramadol Other (comments)     Psychotic Hallucinations  Psychotic Hallucinations  hallucinations  Psychotic Hallucinations    Chlorhexidine Towelette Itching     CHG Wipes    Copper Itching      Medications:     Current Outpatient Medications   Medication Sig    loperamide (IMODIUM) 2 mg capsule     dorzolamide (TRUSOPT) 2 % ophthalmic solution     brimonidine (ALPHAGAN) 0.2 % ophthalmic solution     prednisoLONE acetate (PRED FORTE) 1 % ophthalmic suspension     latanoprost (XALATAN) 0.005 % ophthalmic solution     acetaminophen (TYLENOL) 325 mg tablet Take  by mouth every four (4) hours as needed for Pain.    tacrolimus (PROGRAF) 1 mg capsule Take 8 mg every morning, take 7 mg every evening    ondansetron (ZOFRAN ODT) 8 mg disintegrating tablet Take 8 mg by mouth.    melatonin 3 mg tablet Take 3 mg by mouth.    docusate sodium (COLACE) 100 mg capsule Take 100 mg by mouth daily.    diclofenac (VOLTAREN) 1 % gel Apply 2 g to affected area four (4) times daily.    atorvastatin (LIPITOR) 40 mg tablet Take 40 mg by mouth daily.    moxifloxacin (VIGAMOX) 0.5 % ophthalmic solution     omeprazole (PRILOSEC) 20 mg capsule  (Patient not taking: Reported on 09/26/2020)    amitriptyline (ELAVIL) 25 mg  tablet  (Patient not taking: Reported on 09/26/2020)    mycophenolate mofetil (CELLCEPT) 250 mg capsule Take 1 Capsule by mouth two (2) times a day. (Patient not taking: No sig reported)    fluticasone furoate-vilanteroL (Breo Ellipta) 200-25 mcg/dose inhaler Take 1 Puff by inhalation daily. (Patient not taking: Reported on 09/26/2020)    lidocaine 4 % patch 1 Patch by TransDERmal route daily. (Patient not taking: No sig reported)     No current facility-administered medications for this visit.     Surgical History:     Past Surgical History:   Procedure Laterality Date    FULL ESOPHAGEAL MANOMETRY  02/16/2017         HX BREAST BIOPSY      rt breast, benign    HX CHOLECYSTECTOMY      HX CRANIOTOMY  2013    meningioma     HX HEENT      glasses    HX HYSTERECTOMY      HX IMPLANTABLE CARDIOVERTER DEFIBRILLATOR      HX PACEMAKER  2013    dual chamber icd    HX TONSIL AND ADENOIDECTOMY      HX TOTAL ABDOMINAL HYSTERECTOMY  1996    HX TUBAL LIGATION       Social History:     Social History     Socioeconomic History    Marital status: MARRIED   Occupational History    Occupation: home health aide   Tobacco Use    Smoking status: Never    Smokeless tobacco: Never   Substance and Sexual Activity    Alcohol use: No    Drug use: No    Sexual activity: Yes     Partners: Male     Birth control/protection: Condom  REVIEW OF SYSTEMS:  Review of Systems   Constitutional:  Negative for chills and fever.   HENT:  Negative for ear pain and sore throat.    Eyes:  Negative for blurred vision and pain.   Respiratory:  Negative for cough and shortness of breath.    Cardiovascular:  Negative for chest pain.   Gastrointestinal:  Negative for abdominal pain, blood in stool and melena.   Genitourinary:  Negative for dysuria and hematuria.   Musculoskeletal:  Positive for joint pain and myalgias.   Skin:  Negative for rash.   Neurological:  Negative for headaches.   Endo/Heme/Allergies:  Does not bruise/bleed easily.    Psychiatric/Behavioral:  Negative for substance abuse.      Objective:     Vitals:    09/26/20 1218   BP: 122/64   Pulse: 83   Resp: 14   Temp: 97.8 ??F (36.6 ??C)   TempSrc: Temporal   SpO2: 100%   Weight: 140 lb (63.5 kg)   Height: '5\' 10"'  (1.778 m)   PainSc:   0 - No pain       Physical Exam  Vitals and nursing note reviewed.   HENT:      Head: Normocephalic and atraumatic.      Right Ear: External ear normal.      Left Ear: External ear normal.   Eyes:      General: No scleral icterus.        Right eye: No discharge.         Left eye: No discharge.      Conjunctiva/sclera: Conjunctivae normal.   Cardiovascular:      Rate and Rhythm: Normal rate and regular rhythm.      Heart sounds: No murmur heard.    No friction rub. No gallop.   Pulmonary:      Effort: Pulmonary effort is normal. No respiratory distress.      Breath sounds: Normal breath sounds. No wheezing or rales.   Abdominal:      General: Bowel sounds are normal. There is no distension.      Palpations: Abdomen is soft. There is no mass.      Tenderness: There is no abdominal tenderness. There is no guarding or rebound.   Musculoskeletal:         General: No swelling (BUE) or tenderness (BUE).      Cervical back: Neck supple.      Comments: Along her left leg there is a nodule/mass (circular) along her lateral knee area that is TTP but w/o erythema   Lymphadenopathy:      Cervical: No cervical adenopathy.   Skin:     General: Skin is warm and dry.      Findings: No erythema or rash.   Neurological:      Mental Status: She is alert.      Motor: No abnormal muscle tone.      Gait: Gait normal.   Psychiatric:         Mood and Affect: Mood normal.       DIAGNOSTICS:   Lab Results   Component Value Date/Time    Sodium 139 08/03/2020 01:00 PM    Potassium 5.8 (H) 08/03/2020 01:00 PM    Chloride 109 08/03/2020 01:00 PM    CO2 24 08/03/2020 01:00 PM    Anion gap 6 08/03/2020 01:00 PM    Glucose 91 08/03/2020 01:00 PM    BUN 48 (H) 08/03/2020 01:00 PM  Creatinine 2.24 (H) 08/03/2020 01:00 PM    BUN/Creatinine ratio 21 (H) 08/03/2020 01:00 PM    GFR est AA 26 (L) 08/03/2020 01:00 PM    GFR est non-AA 22 (L) 08/03/2020 01:00 PM    Calcium 9.9 08/03/2020 01:00 PM    Bilirubin, total 0.3 08/30/2019 01:00 PM    Alk. phosphatase 177 (H) 08/30/2019 01:00 PM    Protein, total 7.5 08/30/2019 01:00 PM    Albumin 4.4 08/03/2020 01:00 PM    Globulin 3.6 08/30/2019 01:00 PM    A-G Ratio 1.1 08/30/2019 01:00 PM    ALT (SGPT) 24 08/30/2019 01:00 PM    AST (SGOT) 14 08/30/2019 01:00 PM     Lab Results   Component Value Date/Time    WBC 6.1 08/30/2019 01:00 PM    Hemoglobin, POC 11.6 (L) 01/28/2018 02:24 PM    HGB 8.6 (L) 08/30/2019 01:00 PM    Hematocrit, POC 34 (L) 01/28/2018 02:24 PM    HCT 30.3 (L) 08/30/2019 01:00 PM    PLATELET 212 08/30/2019 01:00 PM    MCV 88.9 08/30/2019 01:00 PM         Assessment/Plan:   1. AKI: +H/o hypokalemia. Her labs from 09/26/20 were reviewed in the [facility]. Her potassium and creatinine are WNL. Her hemoglobin is stable in the 10 range.  -I encouraged her to get her preoperative labs for review.    2. CHF: Stable.  PE findings are reassuring.  Her vital signs are reassuring.  - Continue medication per her transplant team.    3. LLE Mass:   Preoperative Assessment: No contraindications to planned surgery   Orders/studies that need to be obtained prior to surgical clearance: medical clearance has been obtained    Pt is to undergo an intermediate risk procedure with an intermediate cardiac risk based on current history. Labs and imaging within acceptable range. No contraindications to planned surgery.  She is maximally medically managed.  Discontinue NSAIDS 1 week prior to surgical procedure. Follow up as scheduled post operatively.     I have discussed the plan of care with the patient. The patient has received an after-visit summary and questions were answered concerning future plans.  I have discussed medication side effects and warnings with  the patient as well. All questions were answered. The patient understands the plan of care. Handouts provided today with the above information. Pt instructed if symptoms worsen to call the office or report to the ED for continued care.  Greater than 50% of the visit time was spent in counseling and/or coordination of care.      Dr. Marius Ditch  [facility], [place] [address]  Phone: [phone]  Fax: [phone]

## 2020-09-28 NOTE — Telephone Encounter (Signed)
Clearance note has been faxed

## 2020-09-28 NOTE — Telephone Encounter (Signed)
Cathy [name] with [name], Dr Loyal Buba office asking for pre opt clearance note.     Stated surgery is Monday 10/01/20    She may be reached at [phone]  Fax to [phone]    Needs as soon as possible

## 2020-10-03 ENCOUNTER — Ambulatory Visit: Admit: 2020-10-03 | Discharge: 2020-10-03 | Payer: MEDICARE | Attending: Family Medicine | Primary: Family Medicine

## 2020-10-03 ENCOUNTER — Ambulatory Visit: Attending: Family Medicine | Primary: Family Medicine

## 2020-10-03 DIAGNOSIS — R2242 Localized swelling, mass and lump, left lower limb: Secondary | ICD-10-CM

## 2020-10-03 NOTE — Progress Notes (Signed)
 Ocie Stanzione presents today for   Chief Complaint   Patient presents with    Follow-up               1. Have you been to the ER, urgent care clinic since your last visit?  Hospitalized since your last visit? no    2. Have you seen or consulted any other health care providers outside of the [facility] since your last visit? yes     3. For patients aged 70-75: Has the patient had a colonoscopy / FIT/ Cologuard? Yes - no Care Gap present      If the patient is female:    4. For patients aged 67-74: Has the patient had a mammogram within the past 2 years? Yes - no Care Gap present  See top three    5. For patients aged 21-65: Has the patient had a pap smear? Yes - no Care Gap present

## 2020-10-03 NOTE — Telephone Encounter (Signed)
Return in about 3 months (around 01/14/2021).

## 2020-10-03 NOTE — Telephone Encounter (Signed)
Appt scheduled

## 2020-10-03 NOTE — Progress Notes (Signed)
TRANSITIONS OF CARE FACE-TO-FACE VISIT  [facility]:  10/03/2020, MRN: [id number]  Chief Complaint   Patient presents with    Follow-up         Cathy [name] is a 70 y.o. female and presents to clinic after recent hospitalization.      Subjective:   Discharged from:  [facility]    Summary of hospitalization problems/diagnoses: The patient is a 70 year old female with history of hypertension, chronic systolic heart failure (followed by Dr.McCray) with EF of 10%, mitral regurgitation, cholelithiasis, asthma, multinodular goiter, obstructive sleep apnea (not on rx), allergic rhinitis, osteoporosis, uterine fibroids per EHR, insomnia, cataracts, dysphagia from achalasia?, LVAD placement in 2019, AICD placement, S/p heart transplant (2020, followed by [facility]), seizure, left distal radial artery occlusion (2020), left Baker's cyst, Covid-19 (2021), hyperlipidemia, and meningioma status post resection.    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  It was removed along her fibular head and just proximal to this area.  She was told to follow-up with her surgeon, Dr.Balsalmo in 2 weeks.  Wound care: she is planning to change her dressing on Day 5. Pain control: "I'm trying not to take pain medication. It messes up my stomach." She took one oxycodone today. She has pain along the surgical site. She has not taken any pain rx today. "I rather just bear the pain."  Fever: none. Drainage: none    She has a right catheterization scheduled for later this month.     Current Outpatient Medications on File Prior to Visit   Medication Sig Dispense Refill    aspirin (ASPIRIN) 325 mg tablet       furosemide (LASIX) 20 mg tablet       meloxicam (MOBIC) 7.5 mg tablet       oxyCODONE IR (ROXICODONE) 5 mg immediate release tablet Take 1-2 tabs by Mouth Every 6 hours as needed for pain.      moxifloxacin (VIGAMOX) 0.5 % ophthalmic solution       loperamide (IMODIUM) 2 mg capsule       dorzolamide (TRUSOPT) 2 %  ophthalmic solution       brimonidine (ALPHAGAN) 0.2 % ophthalmic solution       prednisoLONE acetate (PRED FORTE) 1 % ophthalmic suspension       latanoprost (XALATAN) 0.005 % ophthalmic solution       acetaminophen (TYLENOL) 325 mg tablet Take  by mouth every four (4) hours as needed for Pain.      tacrolimus (PROGRAF) 1 mg capsule Take 8 mg every morning, take 7 mg every evening      ondansetron (ZOFRAN ODT) 8 mg disintegrating tablet Take 8 mg by mouth.      melatonin 3 mg tablet Take 3 mg by mouth.      docusate sodium (COLACE) 100 mg capsule Take 100 mg by mouth daily.      diclofenac (VOLTAREN) 1 % gel Apply 2 g to affected area four (4) times daily.      atorvastatin (LIPITOR) 40 mg tablet Take 40 mg by mouth daily.      omeprazole (PRILOSEC) 20 mg capsule  (Patient not taking: No sig reported)      amitriptyline (ELAVIL) 25 mg tablet  (Patient not taking: No sig reported)      mycophenolate mofetil (CELLCEPT) 250 mg capsule Take 1 Capsule by mouth two (2) times a day. (Patient not taking: No sig reported) 60 Capsule 5    fluticasone furoate-vilanteroL (Breo Ellipta) 200-25 mcg/dose inhaler Take  1 Puff by inhalation daily. (Patient not taking: No sig reported)      lidocaine 4 % patch 1 Patch by TransDERmal route daily. (Patient not taking: No sig reported)       No current facility-administered medications on file prior to visit.       Medication changes: yes  Medication list updated:  yes  Needs referral or labs:  no  Treatment Barriers: no      Patient Active Problem List    Diagnosis Date Noted    Abnormal MRI 04/30/2020    Depression 08/30/2019    History of seizures 08/30/2019    Mild persistent asthma without complication [id number]    Anemia 08/30/2019    Thyroid nodule 08/30/2019    Esophageal dysphagia 03/17/2017    Age-related osteoporosis without current pathological fracture 03/04/2016    Multinodular goiter 03/04/2016    History of meningioma 03/04/2016    Non-rheumatic mitral regurgitation  04/25/2014    AR (allergic rhinitis) 09/29/2011    Chronic systolic heart failure (HCC)     Mixed hyperlipidemia     AICD (automatic cardioverter/defibrillator) present 11/15/2010    Cataract 12/01/2008    Gallstones 11/13/2008    OSA (obstructive sleep apnea) 11/13/2008    Fibroid 06/23/2008    HTN (hypertension) 06/23/2008       Current Outpatient Medications   Medication Sig Dispense Refill    aspirin (ASPIRIN) 325 mg tablet       furosemide (LASIX) 20 mg tablet       meloxicam (MOBIC) 7.5 mg tablet       oxyCODONE IR (ROXICODONE) 5 mg immediate release tablet Take 1-2 tabs by Mouth Every 6 hours as needed for pain.      moxifloxacin (VIGAMOX) 0.5 % ophthalmic solution       loperamide (IMODIUM) 2 mg capsule       dorzolamide (TRUSOPT) 2 % ophthalmic solution       brimonidine (ALPHAGAN) 0.2 % ophthalmic solution       prednisoLONE acetate (PRED FORTE) 1 % ophthalmic suspension       latanoprost (XALATAN) 0.005 % ophthalmic solution       acetaminophen (TYLENOL) 325 mg tablet Take  by mouth every four (4) hours as needed for Pain.      tacrolimus (PROGRAF) 1 mg capsule Take 8 mg every morning, take 7 mg every evening      ondansetron (ZOFRAN ODT) 8 mg disintegrating tablet Take 8 mg by mouth.      melatonin 3 mg tablet Take 3 mg by mouth.      docusate sodium (COLACE) 100 mg capsule Take 100 mg by mouth daily.      diclofenac (VOLTAREN) 1 % gel Apply 2 g to affected area four (4) times daily.      atorvastatin (LIPITOR) 40 mg tablet Take 40 mg by mouth daily.      omeprazole (PRILOSEC) 20 mg capsule  (Patient not taking: No sig reported)      amitriptyline (ELAVIL) 25 mg tablet  (Patient not taking: No sig reported)      mycophenolate mofetil (CELLCEPT) 250 mg capsule Take 1 Capsule by mouth two (2) times a day. (Patient not taking: No sig reported) 60 Capsule 5    fluticasone furoate-vilanteroL (Breo Ellipta) 200-25 mcg/dose inhaler Take 1 Puff by inhalation daily. (Patient not taking: No sig reported)       lidocaine 4 % patch 1 Patch by TransDERmal route daily. (Patient not taking: No sig reported)  Allergies   Allergen Reactions    Penicillamine Itching    Penicillins Rash and Itching     Itching bumps on hands and arms  Itching bumps on hands and arms  Itching bumps on hands and arms  Has patient had a PCN reaction causing immediate rash, facial/tongue/throat swelling, SOB or lightheadedness with hypotension: Yes  Has patient had a PCN reaction causing severe rash involving mucus membranes or skin necrosis: No  Has patient had a PCN reaction that required hospitalization unknown  Has patient had a PCN reaction occurring within the last 10 years: No  If all of the above answers are "NO", then may proceed with Cephalosporin use.    Itching bumps on hands and arms    Tramadol Other (comments)     Psychotic Hallucinations  Psychotic Hallucinations  hallucinations  Psychotic Hallucinations    Chlorhexidine Towelette Itching     CHG Wipes    Copper Itching       Past Medical History:   Diagnosis Date    Age related osteoporosis     Automatic implantable cardiac defibrillator in situ     Post ddd icd Set up carelink    Baker cyst, left 12/2016    Breast mass, left     benign    CHF (congestive heart failure) ([place]) 06/23/2008    Non-ischemic CMP , Cath (2008) Normal coronary arteries    Chronic systolic heart failure (HCC)     Stable,     Degenerative arthritis of left knee     Fibroid 06/23/2008    History of brain tumor 2013    HLD (hyperlipidemia)     HTN (hypertension) 06/23/2008    Hypotension, unspecified     Related to lisinopril    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    radioactive iodine ablation of thyroid    Left knee pain     With swelling    Leg pain     LVAD (left ventricular assist device) present ([place]) 03/2017    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     OSA (obstructive sleep apnea) 9/10    Venous reflux     Wears glasses        Past Surgical History:   Procedure Laterality Date     FULL ESOPHAGEAL MANOMETRY  02/16/2017         HX BREAST BIOPSY      rt breast, benign    HX CHOLECYSTECTOMY      HX CRANIOTOMY  2013    meningioma     HX HEENT      glasses    HX HYSTERECTOMY      HX IMPLANTABLE CARDIOVERTER DEFIBRILLATOR      HX PACEMAKER  2013    dual chamber icd    HX TONSIL AND ADENOIDECTOMY      HX TOTAL ABDOMINAL HYSTERECTOMY  1996    HX TUBAL LIGATION         Family History   Problem Relation Age of Onset    Diabetes Mother     Heart Attack Father         MI    Heart Disease Maternal Grandmother     Heart Disease Paternal Grandmother     Kidney Disease Other         1 sib deceased    Cancer Other         1 sib throat cancer    Diabetes Other  Diabetes Daughter     Other Daughter         leukemia    Hypertension Other        Social History     Tobacco Use    Smoking status: Never    Smokeless tobacco: Never   Substance Use Topics    Alcohol use: No       ROS   Review of Systems   Constitutional:  Negative for chills and fever.   HENT:  Negative for ear pain and sore throat.    Eyes:  Negative for blurred vision and pain.   Respiratory:  Negative for cough and shortness of breath.    Cardiovascular:  Negative for chest pain.   Gastrointestinal:  Negative for abdominal pain, blood in stool and melena.   Genitourinary:  Negative for dysuria and hematuria.   Musculoskeletal:  Positive for joint pain and myalgias.   Skin:  Negative for rash.        +Surgical site is bandaged   Neurological:  Negative for headaches.   Endo/Heme/Allergies:  Does not bruise/bleed easily.   Psychiatric/Behavioral:  Negative for substance abuse.      Objective     Vitals:    10/03/20 0936   BP: 132/67   Pulse: 89   Resp: 12   Temp: 98.2 ??F (36.8 ??C)   TempSrc: Temporal   Weight: 141 lb (64 kg)   Height: '5\' 10"'$  (1.778 m)   PainSc:   6   PainLoc: Leg       Physical Exam  Vitals and nursing note reviewed.   HENT:      Head: Normocephalic and atraumatic.      Right Ear: External ear normal.      Left Ear: External ear  normal.   Eyes:      General: No scleral icterus.        Right eye: No discharge.         Left eye: No discharge.      Conjunctiva/sclera: Conjunctivae normal.   Cardiovascular:      Rate and Rhythm: Normal rate and regular rhythm.      Heart sounds: Normal heart sounds. No murmur heard.    No friction rub. No gallop.   Pulmonary:      Effort: Pulmonary effort is normal. No respiratory distress.      Breath sounds: Normal breath sounds. No wheezing or rales.   Abdominal:      General: Bowel sounds are normal. There is no distension.      Palpations: Abdomen is soft. There is no mass.      Tenderness: There is no abdominal tenderness. There is no guarding or rebound.   Musculoskeletal:         General: No swelling (BUE) or tenderness (BUE).      Cervical back: Neck supple.      Comments: LLE is covered with an ACE bandage. There is mild TTP along the surgical site   Lymphadenopathy:      Cervical: No cervical adenopathy.   Skin:     General: Skin is warm and dry.      Findings: No erythema or rash.   Neurological:      Mental Status: She is alert.      Motor: No abnormal muscle tone.      Gait: Gait normal.   Psychiatric:         Mood and Affect: Mood normal.       LABS  Data Review:   Lab Results   Component Value Date/Time    WBC 6.1 08/30/2019 01:00 PM    Hemoglobin, POC 11.6 (L) 01/28/2018 02:24 PM    HGB 8.6 (L) 08/30/2019 01:00 PM    Hematocrit, POC 34 (L) 01/28/2018 02:24 PM    HCT 30.3 (L) 08/30/2019 01:00 PM    PLATELET 212 08/30/2019 01:00 PM    MCV 88.9 08/30/2019 01:00 PM       Lab Results   Component Value Date/Time    Sodium 139 08/03/2020 01:00 PM    Potassium 5.8 (H) 08/03/2020 01:00 PM    Chloride 109 08/03/2020 01:00 PM    CO2 24 08/03/2020 01:00 PM    Anion gap 6 08/03/2020 01:00 PM    Glucose 91 08/03/2020 01:00 PM    BUN 48 (H) 08/03/2020 01:00 PM    Creatinine 2.24 (H) 08/03/2020 01:00 PM    BUN/Creatinine ratio 21 (H) 08/03/2020 01:00 PM    GFR est AA 26 (L) 08/03/2020 01:00 PM    GFR est  non-AA 22 (L) 08/03/2020 01:00 PM    Calcium 9.9 08/03/2020 01:00 PM       Lab Results   Component Value Date/Time    Cholesterol, total 170 08/30/2019 01:00 PM    HDL Cholesterol 64 (H) 08/30/2019 01:00 PM    LDL, calculated 86 08/30/2019 01:00 PM    VLDL, calculated 20 08/30/2019 01:00 PM    Triglyceride 100 08/30/2019 01:00 PM    CHOL/HDL Ratio 2.7 08/30/2019 01:00 PM       Lab Results   Component Value Date/Time    Hemoglobin A1c 5.8 12/09/2012 10:50 AM       Assessment/Plan:   Lab review: labs are reviewed in the EHR  Referrals: not needed  Complexity: Moderate    Community resources identified for patient: none needed  Durable Medical Equipment: none needed    LLE schwannoma: Status post removal.  -I encouraged her to follow-up with her surgeon.  - I encouraged her to c/w pain rx as needed. I encouraged her to take miralax and docusate if she is taking at least one oxycodone tab per day to prevent narcotic induced constipation.  - I reiterated the importance of not changing her dressing until Day 5 per her surgeon's recommendations.     A. Key points we discussed today:  1. Pt instructed to call our office if symptoms worsen or if the pt/caregiver has any questions.  2. Handout given       Orders Placed This Encounter    aspirin (ASPIRIN) 325 mg tablet    furosemide (LASIX) 20 mg tablet    meloxicam (MOBIC) 7.5 mg tablet    oxyCODONE IR (ROXICODONE) 5 mg immediate release tablet     Sig: Take 1-2 tabs by Mouth Every 6 hours as needed for pain.       B. New labs/medications ordered today:   1. A new medication list was given to the patient/family/caregiver.        The medication list has been updated. A new medication list was given today to the patient. I have discussed the diagnosis with the patient and the intended plan as seen in the above orders.  The patient has received an after-visit summary and questions were answered concerning future plans.  I have discussed medication side effects and warnings with  the patient as well. I have reviewed the plan of care with the patient, accepted their input and they are in agreement  with the treatment goals. All questions were answered. The patient understands the plan of care. Handouts provided today with above information. Pt instructed if symptoms worsen to call the office or report to the ED for continued care.  Greater than 50% of the visit time was spent in counseling and/or coordination of care.

## 2020-10-16 LAB — AMB EXT LDL-C
LDL-C, External: 59
LDL-C, External: 59 NA

## 2020-10-16 LAB — AMB EXT HGBA1C
Hemoglobin A1C, External: 6 %
Hemoglobin A1c, External: 6 %

## 2020-10-16 LAB — AMB EXT CREATININE
Creatinine, External: 1.1
Creatinine, External: 1.1 NA

## 2020-10-16 LAB — HEMOGLOBIN A1C
Estimated Avg Glucose, External: 126 mg/dL — ABNORMAL HIGH (ref 91–123)
Hemoglobin A1C, External: 6 % — ABNORMAL HIGH (ref 4.8–5.6)

## 2020-12-27 ENCOUNTER — Encounter

## 2020-12-27 ENCOUNTER — Ambulatory Visit: Admit: 2020-12-27 | Discharge: 2020-12-27 | Payer: MEDICARE | Attending: Family Medicine | Primary: Family Medicine

## 2020-12-27 DIAGNOSIS — R7401 Elevation of levels of liver transaminase levels: Secondary | ICD-10-CM

## 2020-12-27 NOTE — Progress Notes (Signed)
[facility] [place]:  12/27/2020, MRN: [id number]      Cathy [name] is a 70 y.o. female and presents to clinic for Follow-up      Subjective:   The patient is a 70 year old female with history of hypertension, chronic systolic heart failure (followed by Dr.McCray) with EF of 10%, mitral regurgitation, cholelithiasis, asthma, multinodular goiter, obstructive sleep apnea (not on rx), allergic rhinitis, osteoporosis, uterine fibroids per EHR, insomnia, cataracts, dysphagia from achalasia?, LVAD placement in 2019, AICD placement, S/p heart transplant (2020, followed by [facility]), seizure, left distal radial artery occlusion (2020), left Baker's cyst, Covid-19 (2021), hyperlipidemia, and meningioma status post resection.    1. LLE Schwannoma: No pain s/p surgical removal.  Her surgeon is Dr.Balsalmo.     2. CHF: She sees her Cardiology team later this month.  She has a history of a cardiac/heart transplant.  She takes atorvastatin, aspirin, Lasix, tacrolimus, and CellCept.  Asymptomatic.    3. GERD: She is scheduled for an EGD next month.  She has severe acid reflux despite use of Prilosec.    4. Osteoporosis: She is scheduled for a DEXA scan later this month at [facility]. She has a follow up apt with her transplant team to discuss the results. She used to take prolia.     5. Transaminitis: Noted on her [facility] September labs. +Statin.  +H/o elevated alkaline phosphatase level and osteoporosis.  No ETOH use.     6. H/o Thyroid Nodules: Overdue for labs.     06/05/20 Thyroid Ultrasound: Bilateral thyroid hypervascularity suggests thyroiditis. No significant change in size or imaging characteristics of left thyroid nodules measuring 1.6 x 1.3 x 1.6 cm and 1.3 x 1.2 x 1.1 cm. New 1.1 cm TR 3 Isthmus nodule not requiring FNA or follow-up required per TI-RADS criteria. New subcentimeter left upper spongiform nodule. No FNA or follow-up required TI-RADS criteria.        Patient Active Problem List     Diagnosis Date Noted    Abnormal MRI 04/30/2020    Depression 08/30/2019    History of seizures 08/30/2019    Mild persistent asthma without complication 40/98/1191    Anemia 08/30/2019    Thyroid nodule 08/30/2019    Esophageal dysphagia 03/17/2017    Age-related osteoporosis without current pathological fracture 03/04/2016    Multinodular goiter 03/04/2016    History of meningioma 03/04/2016    Non-rheumatic mitral regurgitation 04/25/2014    AR (allergic rhinitis) 09/29/2011    Chronic systolic heart failure (HCC)     Mixed hyperlipidemia     AICD (automatic cardioverter/defibrillator) present 11/15/2010    Cataract 12/01/2008    Gallstones 11/13/2008    OSA (obstructive sleep apnea) 11/13/2008    Fibroid 06/23/2008    HTN (hypertension) 06/23/2008       Current Outpatient Medications   Medication Sig Dispense Refill    atorvastatin (LIPITOR) 10 mg tablet       aspirin (ASPIRIN) 325 mg tablet       furosemide (LASIX) 20 mg tablet Take 20 mg by mouth daily as needed (swelling).      meloxicam (MOBIC) 7.5 mg tablet       oxyCODONE IR (ROXICODONE) 5 mg immediate release tablet Take 1-2 tabs by Mouth Every 6 hours as needed for pain.      moxifloxacin (VIGAMOX) 0.5 % ophthalmic solution       omeprazole (PRILOSEC) 20 mg capsule       dorzolamide (TRUSOPT) 2 % ophthalmic solution  brimonidine (ALPHAGAN) 0.2 % ophthalmic solution       prednisoLONE acetate (PRED FORTE) 1 % ophthalmic suspension       latanoprost (XALATAN) 0.005 % ophthalmic solution       acetaminophen (TYLENOL) 325 mg tablet Take  by mouth every four (4) hours as needed for Pain.      tacrolimus (PROGRAF) 1 mg capsule Take 8 mg every morning, take 7 mg every evening      ondansetron (ZOFRAN ODT) 8 mg disintegrating tablet Take 8 mg by mouth.      melatonin 3 mg tablet Take 3 mg by mouth.      docusate sodium (COLACE) 100 mg capsule Take 100 mg by mouth daily.      diclofenac (VOLTAREN) 1 % gel Apply 2 g to affected area four (4) times daily.       loperamide (IMODIUM) 2 mg capsule  (Patient not taking: Reported on 12/27/2020)      amitriptyline (ELAVIL) 25 mg tablet  (Patient not taking: No sig reported)      fluticasone furoate-vilanteroL (Breo Ellipta) 200-25 mcg/dose inhaler Take 1 Puff by inhalation daily. (Patient not taking: No sig reported)      lidocaine 4 % patch 1 Patch by TransDERmal route daily. (Patient not taking: No sig reported)         Allergies   Allergen Reactions    Penicillamine Itching    Penicillins Rash and Itching     Itching bumps on hands and arms  Itching bumps on hands and arms  Itching bumps on hands and arms  Has patient had a PCN reaction causing immediate rash, facial/tongue/throat swelling, SOB or lightheadedness with hypotension: Yes  Has patient had a PCN reaction causing severe rash involving mucus membranes or skin necrosis: No  Has patient had a PCN reaction that required hospitalization unknown  Has patient had a PCN reaction occurring within the last 10 years: No  If all of the above answers are "NO", then may proceed with Cephalosporin use.    Itching bumps on hands and arms    Tramadol Other (comments)     Psychotic Hallucinations  Psychotic Hallucinations  hallucinations  Psychotic Hallucinations    Chlorhexidine Towelette Itching     CHG Wipes    Copper Itching       Past Medical History:   Diagnosis Date    Age related osteoporosis     Automatic implantable cardiac defibrillator in situ     Post ddd icd Set up carelink    Baker cyst, left 12/2016    Breast mass, left     benign    CHF (congestive heart failure) ([place]) 06/23/2008    Non-ischemic CMP , Cath (2008) Normal coronary arteries    Chronic systolic heart failure (HCC)     Stable,     Degenerative arthritis of left knee     Fibroid 06/23/2008    History of brain tumor 2013    HLD (hyperlipidemia)     HTN (hypertension) 06/23/2008    Hypotension, unspecified     Related to lisinopril    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    radioactive iodine ablation of thyroid     Left knee pain     With swelling    Leg pain     LVAD (left ventricular assist device) present ([place]) 03/2017    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     OSA (obstructive sleep apnea) 9/10  Venous reflux     Wears glasses        Past Surgical History:   Procedure Laterality Date    FULL ESOPHAGEAL MANOMETRY  02/16/2017         HX BREAST BIOPSY      rt breast, benign    HX CHOLECYSTECTOMY      HX CRANIOTOMY  2013    meningioma     HX HEENT      glasses    HX HYSTERECTOMY      HX IMPLANTABLE CARDIOVERTER DEFIBRILLATOR      HX PACEMAKER  2013    dual chamber icd    HX TONSIL AND ADENOIDECTOMY      HX TOTAL ABDOMINAL HYSTERECTOMY  1996    HX TUBAL LIGATION      PR CARDIAC SURG PROCEDURE UNLIST  11/05/2018       Family History   Problem Relation Age of Onset    Diabetes Mother     Heart Attack Father         MI    Heart Disease Maternal Grandmother     Heart Disease Paternal Grandmother     Kidney Disease Other         1 sib deceased    Cancer Other         1 sib throat cancer    Diabetes Other     Diabetes Daughter     Other Daughter         leukemia    Hypertension Other        Social History     Tobacco Use    Smoking status: Never    Smokeless tobacco: Never   Substance Use Topics    Alcohol use: Not Currently     Alcohol/week: 20.0 standard drinks     Types: 20 Glasses of wine per week       ROS   Review of Systems   Constitutional:  Negative for chills and fever.   HENT:  Negative for ear pain and sore throat.    Eyes:  Negative for blurred vision and pain.   Respiratory:  Negative for cough and shortness of breath.    Cardiovascular:  Negative for chest pain.   Gastrointestinal:  Negative for abdominal pain, blood in stool and melena.   Genitourinary:  Negative for dysuria and hematuria.   Musculoskeletal:  Negative for joint pain and myalgias.   Skin:  Negative for rash.   Neurological:  Negative for headaches.   Endo/Heme/Allergies:  Does not bruise/bleed easily.   Psychiatric/Behavioral:   Negative for substance abuse.      Objective     Vitals:    12/27/20 0922   BP: 115/62   Pulse: 87   Resp: 12   Temp: 97.5 ??F (36.4 ??C)   TempSrc: Temporal   SpO2: 93%   Weight: 140 lb (63.5 kg)   Height: '5\' 10"'  (1.778 m)   PainSc:   0 - No pain       Physical Exam  Vitals and nursing note reviewed.   HENT:      Head: Normocephalic and atraumatic.      Right Ear: External ear normal.      Left Ear: External ear normal.   Eyes:      General: No scleral icterus.        Right eye: No discharge.         Left eye: No discharge.      Conjunctiva/sclera: Conjunctivae  normal.   Cardiovascular:      Rate and Rhythm: Normal rate and regular rhythm.      Heart sounds: Normal heart sounds. No murmur heard.    No friction rub. No gallop.   Pulmonary:      Effort: Pulmonary effort is normal. No respiratory distress.      Breath sounds: Normal breath sounds. No wheezing or rales.   Abdominal:      General: Bowel sounds are normal. There is no distension.      Palpations: Abdomen is soft. There is no mass.      Tenderness: There is no abdominal tenderness. There is no guarding or rebound.   Musculoskeletal:         General: No swelling (BUE) or tenderness (BUE).      Cervical back: Neck supple.      Comments: LLE is NTTP at her schwannoma surgical site   Lymphadenopathy:      Cervical: No cervical adenopathy.   Skin:     General: Skin is warm and dry.      Findings: No erythema or rash.   Neurological:      Mental Status: She is alert.      Motor: No abnormal muscle tone.      Gait: Gait normal.   Psychiatric:         Mood and Affect: Mood normal.       LABS   Data Review:   Lab Results   Component Value Date/Time    WBC 6.1 08/30/2019 01:00 PM    Hemoglobin, POC 11.6 (L) 01/28/2018 02:24 PM    HGB 8.6 (L) 08/30/2019 01:00 PM    Hematocrit, POC 34 (L) 01/28/2018 02:24 PM    HCT 30.3 (L) 08/30/2019 01:00 PM    PLATELET 212 08/30/2019 01:00 PM    MCV 88.9 08/30/2019 01:00 PM       Lab Results   Component Value Date/Time    Sodium  139 08/03/2020 01:00 PM    Potassium 5.8 (H) 08/03/2020 01:00 PM    Chloride 109 08/03/2020 01:00 PM    CO2 24 08/03/2020 01:00 PM    Anion gap 6 08/03/2020 01:00 PM    Glucose 91 08/03/2020 01:00 PM    BUN 48 (H) 08/03/2020 01:00 PM    Creatinine 2.24 (H) 08/03/2020 01:00 PM    BUN/Creatinine ratio 21 (H) 08/03/2020 01:00 PM    GFR est AA 26 (L) 08/03/2020 01:00 PM    GFR est non-AA 22 (L) 08/03/2020 01:00 PM    Calcium 9.9 08/03/2020 01:00 PM       Lab Results   Component Value Date/Time    Cholesterol, total 170 08/30/2019 01:00 PM    HDL Cholesterol 64 (H) 08/30/2019 01:00 PM    LDL, calculated 86 08/30/2019 01:00 PM    VLDL, calculated 20 08/30/2019 01:00 PM    Triglyceride 100 08/30/2019 01:00 PM    CHOL/HDL Ratio 2.7 08/30/2019 01:00 PM       Lab Results   Component Value Date/Time    Hemoglobin A1c 5.8 12/09/2012 10:50 AM    Hemoglobin A1c, External 6.0 10/16/2020 12:00 AM       Assessment/Plan:   1. Transaminasemia: From NAFLD? Statin?   -Checking labs.  - Checking a right upper quadrant ultrasound.  -Avoidance of alcohol    ORDERS:  - TSH AND FREE T4; Future  - METABOLIC PANEL, COMPREHENSIVE; Future  - ALK PHOS ISOENZYMES; Future  - [place] ABD LTD; Future  2. Multinodular goiter: Stable/unchanged.  -Checking labs.  -We discussed getting a follow-up thyroid ultrasound in April next year.    ORDERS:  - TSH AND FREE T4; Future  - METABOLIC PANEL, COMPREHENSIVE; Future    3. Leg mass, left: Status post schwannoma removal.  Observation.    4. Chronic systolic heart failure: Stable.  -Continue medication as prescribed by her cardiology/transplant team.  Continue routine checkups.    5. Gastroesophageal reflux disease: Chronic and unchanged.  She has a history of dysphagia off and on.  -Continue with PPI therapy.  - I encouraged her to follow-up with her GI team for anticipated EGD.    6. Age-related osteoporosis without current pathological fracture: S/p Prolia.  -I encouraged her to get a follow-up bone density  study via her transplant team to determine whether or not she should restart treatment.        Health Maintenance Due   Topic Date Due    Shingrix Vaccine Age 74> (1 of 2) Never done    DTaP/Tdap/Td series (1 - Tdap) Never done    COVID-19 Vaccine (3 - Pfizer risk series) 06/09/2019         Lab review: labs are reviewed in the EHR and ordered as mentioned above.    I have discussed the diagnosis with the patient and the intended plan as seen in the above orders.  The patient has received an after-visit summary and questions were answered concerning future plans.  I have discussed medication side effects and warnings with the patient as well. I have reviewed the plan of care with the patient, accepted their input and they are in agreement with the treatment goals. All questions were answered. The patient understands the plan of care. Handouts provided today with above information. Pt instructed if symptoms worsen to call the office or report to the ED for continued care.  Greater than 50% of the visit time was spent in counseling and/or coordination of care.      Voice recognition was used to generate this report, which may have resulted in some phonetic based errors in grammar and contents. Even though attempts were made to correct all the mistakes, some may have been missed, and remained in the body of the document.          Leo Rod, MD

## 2020-12-27 NOTE — Progress Notes (Signed)
 Cathy [name] presents today for   Chief Complaint   Patient presents with    Follow-up       1. Have you been to the ER, urgent care clinic since your last visit?  Hospitalized since your last visit? NO    2. Have you seen or consulted any other health care providers outside of the [facility] since your last visit? Yes     3. For patients aged 70-75: Has the patient had a colonoscopy / FIT/ Cologuard? Yes - no Care Gap present      If the patient is female:    4. For patients aged 56-74: Has the patient had a mammogram within the past 2 years? Yes - no Care Gap present  See top three    5. For patients aged 21-65: Has the patient had a pap smear? NA - based on age or sex

## 2020-12-27 NOTE — Interval H&P Note (Signed)
PRE-SURGICAL INSTRUCTIONS        Patient's Name:  Cathy [name]      [id number] Date:  12/27/2020            Covid Testing Date and Time:    Surgery Date:  12/31/2020                Do NOT eat or drink anything, including candy, gum, or ice chips after midnight on 11/7, unless you have specific instructions from your surgeon or anesthesia provider to do so.  You may brush your teeth before coming to the hospital.  No smoking 24 hours prior to the day of surgery.  No alcohol 24 hours prior to the day of surgery.  No recreational drugs for one week prior to the day of surgery.  Leave all valuables, including money/purse, at home.  Remove all jewelry, nail polish, acrylic nails, and makeup (including mascara); no lotions powders, deodorant, or perfume/cologne/after shave on the skin.  Follow instruction for Hibiclens washes and CHG wipes from surgeon's office.   Glasses/contact lenses and dentures may be worn to the hospital.  They will be removed prior to surgery.  Call your doctor if symptoms of a cold or illness develop within 24-48 hours prior to your surgery.  11.  If you are having an outpatient procedure, please make arrangements for a responsible ADULT TO DRIVE YOU HOME AFTER SURGERY and stay with you for 24 hours after your surgery.  12.  ONE VISITOR in the hospital at this time for outpatient procedures.  Exceptions may be made for surgical admissions, per nursing unit guidelines      Special Instructions:      Bring list of CURRENT medications.    Bring any pertinent legal medical records.  Take these medications the morning of surgery with a sip of water:  per office        On the day of surgery, come in the main entrance of [facility].  Let the security guard at the desk know you are there for surgery.  A staff member will come escort you to the surgical area on the second floor.    If you have any questions or concerns, please do not hesitate to call:     (Prior to the day of surgery) PAT  department:  [phone]   (Day of surgery) Pre-Op department:  [phone]    These surgical instructions were reviewed with the patient during the PAT phone call.

## 2020-12-27 NOTE — Telephone Encounter (Signed)
Return in about 6 months (around 06/26/2021).    Check out comments: Please schedule for labs this month. 40 min

## 2020-12-28 NOTE — Telephone Encounter (Signed)
Patient reached, appt scheduled

## 2020-12-31 ENCOUNTER — Inpatient Hospital Stay: Payer: MEDICARE

## 2020-12-31 LAB — POTASSIUM
Potassium: 5.1 mmol/L (ref 3.5–5.5)
Potassium: 5.1 mmol/L (ref 3.5–5.5)

## 2020-12-31 MED ORDER — FAMOTIDINE (PF) 20 MG/2 ML IV
202 mg/2 mL | Freq: Once | INTRAVENOUS | Status: AC
Start: 2020-12-31 — End: 2020-12-31
  Administered 2020-12-31: 12:00:00 via INTRAVENOUS

## 2020-12-31 MED ORDER — LIDOCAINE (PF) 20 MG/ML (2 %) IJ SOLN
20 mg/mL (2 %) | INTRAMUSCULAR | Status: DC | PRN
Start: 2020-12-31 — End: 2020-12-31
  Administered 2020-12-31: 13:00:00 via INTRAVENOUS

## 2020-12-31 MED ORDER — LACTATED RINGERS IV
INTRAVENOUS | Status: DC
Start: 2020-12-31 — End: 2020-12-31
  Administered 2020-12-31: 12:00:00 via INTRAVENOUS

## 2020-12-31 MED ORDER — INSULIN LISPRO 100 UNIT/ML INJECTION
100 unit/mL | Freq: Once | SUBCUTANEOUS | Status: DC
Start: 2020-12-31 — End: 2020-12-31

## 2020-12-31 MED ORDER — PROPOFOL 10 MG/ML IV EMUL
10 mg/mL | INTRAVENOUS | Status: DC | PRN
Start: 2020-12-31 — End: 2020-12-31
  Administered 2020-12-31 (×4): via INTRAVENOUS

## 2020-12-31 MED FILL — FAMOTIDINE (PF) 20 MG/2 ML IV: 20 mg/2 mL | INTRAVENOUS | Qty: 2

## 2020-12-31 MED FILL — LACTATED RINGERS IV: INTRAVENOUS | Qty: 1000

## 2020-12-31 NOTE — Anesthesia Pre-Procedure Evaluation (Signed)
Anesthetic History   No history of anesthetic complications            Review of Systems / Medical History  Patient summary reviewed, nursing notes reviewed and pertinent labs reviewed    Pulmonary  Within defined limits                 Neuro/Psych   Within defined limits           Cardiovascular    Hypertension          Hyperlipidemia      Comments: S/p heart transplant    10/2020 ECHO  ejection fraction of 60 %     GI/Hepatic/Renal     GERD           Endo/Other        Arthritis     Other Findings            Physical Exam    Airway  Mallampati: III  TM Distance: 4 - 6 cm  Neck ROM: normal range of motion   Mouth opening: Normal     Cardiovascular    Rhythm: regular           Dental    Dentition: Upper dentition intact and Lower dentition intact     Pulmonary  Breath sounds clear to auscultation               Abdominal  GI exam deferred       Other Findings            Anesthetic Plan    ASA: 3  Anesthesia type: MAC            Anesthetic plan and risks discussed with: Patient

## 2020-12-31 NOTE — Procedures (Signed)
Procedures  by Haywood Filler, MD at 12/31/20 8416                Author: Haywood Filler, MD  Service: Gastroenterology  Author Type: Physician       Filed: 12/31/20 0946  Date of Service: 12/31/20 0925  Status: Signed          Editor: Haywood Filler, MD (Physician)            Pre-procedure Diagnoses        1. 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 EGD [SAY3016 (Custom)]                                            [email or web address] [address]         Brief Procedure Note      Guynell Kleiber   February 12, 1951   [id number]      Date of Procedure: 12/31/2020      Preoperative diagnosis: Constipation, unspecified constipation type [K59.00]   Esophageal dysmotility [K22.4]   Acute gastric ulcer without hemorrhage or perforation [K25.3]   Colon cancer screening [Z12.11]   Heart transplant recipient [facility]) [Z94.1]      Postoperative diagnosis: Normal      Type of Anesthesia: MAC (Monitored anesthesia care)      Description of findings: same as post op dx      Procedure: Procedure(s):   UPPER ENDOSCOPY with Bx's      Operator:  Dr. Haywood Filler, MD      Assistant(s): Endoscopy Technician-1: Durene Cal   Endoscopy RN-1: Emmit Alexanders, RN; Esmond Harps, RN      URK:YHCW      Specimens:            ID  Type  Source  Tests  Collected by  Time  Destination             1 : Antrum Bx's  Preservative  Stomach, Antrum    Haywood Filler, MD  12/31/2020 [phone]  Pathology     2 : Body Bx's  Preservative  Stomach, Body    Haywood Filler, MD  12/31/2020 0749  Pathology             3 : Incisura Bxs  Preservative  Gastric    Haywood Filler, MD  12/31/2020 0750  Pathology           Findings: See printed and scanned procedure note      Complications: None      Dr. Haywood Filler, MD   12/31/2020   9:46 AM

## 2020-12-31 NOTE — Anesthesia Post-Procedure Evaluation (Signed)
Procedure(s):  UPPER ENDOSCOPY with Bx's.    MAC    Anesthesia Post Evaluation      Multimodal analgesia: multimodal analgesia used between 6 hours prior to anesthesia start to PACU discharge  Patient location during evaluation: PACU  Patient participation: complete - patient participated  Level of consciousness: awake and alert  Pain management: adequate  Airway patency: patent  Anesthetic complications: no  Cardiovascular status: acceptable and hemodynamically stable  Respiratory status: acceptable  Hydration status: acceptable  Post anesthesia nausea and vomiting:  controlled      INITIAL Post-op Vital signs:   Vitals Value Taken Time   BP 98/49 12/31/20 0829   Temp 36.4 ??C (97.6 ??F) 12/31/20 0829   Pulse 80 12/31/20 0835   Resp 21 12/31/20 0835   SpO2 100 % 12/31/20 0835   Vitals shown include unvalidated device data.

## 2020-12-31 NOTE — H&P (Signed)
H&P by Haywood Filler,  MD at 12/31/20 1829                Author: Haywood Filler, MD  Service: Gastroenterology  Author Type: Physician       Filed: 12/31/20 0729  Date of Service: 12/31/20 0728  Status: Signed          Editor: Haywood Filler, MD (Physician)                    [email or web address]   [phone]      GASTROENTEROLOGY Pre-Procedure H and P           Impression/Plan:     1. This patient is consented for an EGD for dysphagia, h/o gastric ulcer that was H.pylori neg in 2018, h/o gastric intestinal metaplasia.         Chief Complaint: dysphagia, h/o gastric ulcer that was H.pylori neg in 2018, h/o gastric intestinal metaplasia.         HPI:   Cathy [name] is a 70 y.o. female who presents for an EGD for evaluation of dysphagia, h/o gastric ulcer that was H.pylori neg in 2018, h/o gastric intestinal metaplasia.      PMH:      Past Medical History:        Diagnosis  Date         ?  Age related osteoporosis       ?  Automatic implantable cardiac defibrillator in situ            Post ddd icd Set up carelink         ?  Baker cyst, left  12/2016     ?  Breast mass, left            benign         ?  CHF (congestive heart failure) ([place])  06/23/2008          Non-ischemic CMP , Cath (2008) Normal coronary arteries         ?  Chronic systolic heart failure (HCC)            Stable,          ?  Degenerative arthritis of left knee       ?  Fibroid  06/23/2008     ?  Gastric intestinal metaplasia       ?  History of brain tumor  2013     ?  HLD (hyperlipidemia)       ?  HTN (hypertension)  06/23/2008     ?  Hypotension, unspecified            Related to lisinopril         ?  Hypothyroid hx goiter  06/23/2008          radioactive iodine ablation of thyroid         ?  Left knee pain            With swelling         ?  Leg pain           ?  LVAD (left ventricular assist device) present ([place])  03/2017         ?  Mitral valve disorders(424.0)  04/11/2013          mod to severe mr          ?  OSA (obstructive sleep  apnea)  10/2008     ?  PUD (  peptic ulcer disease)  2018          small superficial gastric ulcer         ?  Venous reflux           ?  Wears glasses             PSH:      Past Surgical History:         Procedure  Laterality  Date          ?  FULL ESOPHAGEAL MANOMETRY    02/16/2017                     ?  HX BREAST BIOPSY              rt breast, benign          ?  HX CHOLECYSTECTOMY         ?  HX CRANIOTOMY    2013          meningioma           ?  HX HEENT              glasses          ?  HX HYSTERECTOMY         ?  HX IMPLANTABLE CARDIOVERTER DEFIBRILLATOR         ?  HX PACEMAKER    2013          dual chamber icd-removed 2020          ?  HX TONSIL AND ADENOIDECTOMY         ?  HX TOTAL ABDOMINAL HYSTERECTOMY    1996     ?  HX TUBAL LIGATION         ?  PR CARDIAC SURG PROCEDURE UNLIST    11/05/2018          heart transplant           Social HX:      Social History          Socioeconomic History         ?  Marital status:  MARRIED              Spouse name:  Not on file         ?  Number of children:  Not on file     ?  Years of education:  Not on file     ?  Highest education level:  Not on file       Occupational History         ?  Occupation:  home health aide       Tobacco Use         ?  Smoking status:  Never     ?  Smokeless tobacco:  Never       Substance and Sexual Activity         ?  Alcohol use:  Not Currently              Alcohol/week:  20.0 standard drinks         Types:  20 Glasses of wine per week         ?  Drug use:  Never     ?  Sexual activity:  Not Currently              Partners:  Male  Birth control/protection:  None        Other Topics  Concern         ?  Military Service  Not Asked         ?  Blood Transfusions  Not Asked         ?  Caffeine Concern  Not Asked     ?  Occupational Exposure  Not Asked     ?  Hobby Hazards  Not Asked     ?  Sleep Concern  Not Asked     ?  Stress Concern  Not Asked     ?  Weight Concern  Not Asked     ?  Special Diet  Not Asked     ?  Back Care  Not Asked     ?   Exercise  Not Asked     ?  Bike Helmet  Not Asked     ?  Seat Belt  Not Asked     ?  Self-Exams  Not Asked       Social History Narrative        ?  Not on file          Social Determinants of Health          Financial Resource Strain: Not on file     Food Insecurity: Not on file     Transportation Needs: Not on file     Physical Activity: Not on file     Stress: Not on file     Social Connections: Not on file     Intimate Partner Violence: Not on file       Housing Stability: Not on file           FHX:      Family History         Problem  Relation  Age of Onset          ?  Diabetes  Mother       ?  Heart Attack  Father                MI          ?  Heart Disease  Maternal Grandmother       ?  Heart Disease  Paternal Grandmother       ?  Kidney Disease  Other                1 sib deceased          ?  Cancer  Other                1 sib throat cancer          ?  Diabetes  Other       ?  Diabetes  Daughter       ?  Other  Daughter                leukemia          ?  Hypertension  Other             Allergy:      Allergies        Allergen  Reactions         ?  Penicillamine  Itching     ?  Penicillins  Rash and Itching             Itching bumps on hands and arms  Itching bumps on hands and arms   Itching bumps on hands and arms   Has patient had a PCN reaction causing immediate rash, facial/tongue/throat swelling, SOB or lightheadedness with hypotension: Yes   Has patient had a PCN reaction causing severe rash involving mucus membranes or skin necrosis: No   Has patient had a PCN reaction that required hospitalization unknown   Has patient had a PCN reaction occurring within the last 10 years: No   If all of the above answers are "NO", then may proceed with Cephalosporin use.      Itching bumps on hands and arms         ?  Tramadol  Other (comments)             Psychotic Hallucinations   Psychotic Hallucinations   hallucinations   Psychotic Hallucinations         ?  Chlorhexidine [place]  Itching             CHG Wipes          ?  Copper  Itching             Home Medications:          Medications Prior to Admission        Medication  Sig         ?  atorvastatin (LIPITOR) 10 mg tablet  daily.     ?  mycophenolate (CELLCEPT) 500 mg tablet  Take 250 mg by mouth two (2) times a day.     ?  aspirin (ASPIRIN) 325 mg tablet  daily.     ?  furosemide (LASIX) 20 mg tablet  Take 20 mg by mouth daily as needed (swelling).     ?  meloxicam (MOBIC) 7.5 mg tablet  daily.     ?  omeprazole (PRILOSEC) 20 mg capsule       ?  prednisoLONE acetate (PRED FORTE) 1 % ophthalmic suspension       ?  acetaminophen (TYLENOL) 325 mg tablet  Take  by mouth every four (4) hours as needed for Pain.     ?  tacrolimus (PROGRAF) 1 mg capsule  8 mg two (2) times a day.     ?  melatonin 3 mg tablet  Take 3 mg by mouth.     ?  oxyCODONE IR (ROXICODONE) 5 mg immediate release tablet  Take 1-2 tabs by Mouth Every 6 hours as needed for pain. (Patient not taking: Reported on 12/27/2020)     ?  dorzolamide (TRUSOPT) 2 % ophthalmic solution   (Patient not taking: Reported on 12/31/2020)     ?  brimonidine (ALPHAGAN) 0.2 % ophthalmic solution   (Patient not taking: Reported on 12/31/2020)     ?  latanoprost (XALATAN) 0.005 % ophthalmic solution   (Patient not taking: Reported on 12/31/2020)     ?  lidocaine 4 % patch  1 Patch by TransDERmal route daily. (Patient not taking: No sig reported)     ?  docusate sodium (COLACE) 100 mg capsule  Take 100 mg by mouth daily. (Patient not taking: Reported on 12/27/2020)         ?  diclofenac (VOLTAREN) 1 % gel  Apply 2 g to affected area four (4) times daily. (Patient not taking: Reported on 12/27/2020)             Review of Systems:        A complete 10 point review of systems was  performed and pertinents are as per the HPI. Remainder of the review of systems was negative.          Visit Vitals      BP  133/67     Pulse  78     Temp  97.4 ??F (36.3 ??C)     Resp  16     Wt  64.4 kg (142 lb)     LMP   (LMP Unknown)     SpO2  99%        BMI   20.37 kg/m??             Physical Assessment:        General: alert, cooperative, no acute distress, appears stated age.   HEENT: normocephalic, no scleral icterus, moist mucous membranes, EOMs intact, no neck masses noted.   Respiratory: lungs clear to auscultation bilaterally.   Cardiovascular: regular rate and rhythm, no murmurs, rubs or gallops.   Abdomen: normal bowel sounds, soft, non-tender to palpation.   Extremities: no lower extremity edema, no cyanosis or clubbing.   Neuro: alert and oriented x 3; non-focal exam.   Skin: no rashes.   Psych: normal mood and affect.               Haywood Filler, MD   [facility]   [email or web address]   Phone: [phone]   Pager: [phone]   [email or web address]

## 2021-01-14 ENCOUNTER — Ambulatory Visit: Payer: MEDICARE | Primary: Family Medicine

## 2021-05-13 NOTE — Telephone Encounter (Signed)
Pt calling for status of message below. She made a virtual appt for tomorrow.

## 2021-05-13 NOTE — Telephone Encounter (Signed)
Pt is coughing and is having nausea no fever synmptoms started Friday 03/17. She is asking if Dr could call something into her pharmacy for it .

## 2021-05-14 ENCOUNTER — Telehealth: Admit: 2021-05-14 | Discharge: 2021-05-14 | Payer: MEDICARE | Attending: Family Medicine | Primary: Family Medicine

## 2021-05-14 DIAGNOSIS — J069 Acute upper respiratory infection, unspecified: Secondary | ICD-10-CM

## 2021-05-14 MED ORDER — AZITHROMYCIN 250 MG PO TABS
250 MG | ORAL_TABLET | ORAL | 0 refills | Status: AC
Start: 2021-05-14 — End: 2021-05-19

## 2021-05-14 NOTE — Progress Notes (Signed)
Cathy [name] is a 71 y.o. female who was seen by synchronous (real-time) audio-video technology on 05/14/2021.        Assessment & Plan:   1. CHF: +H/o heart transplant.  Stable.  - Continue with Rx as prescribed.    2.  Transaminitis: Likely from her antirejection medications per her transplant team-per her history.  Observation.    3. URI/Bronchitis: Symptoms are worsening.  She is immunocompromise given her antirejection medications and history of transplant.  - Ordering a course of azithromycin for her to take for presumed bacterial respiratory tract infection.  - I encouraged her to go to the ED if her symptoms worsen.  - I encouraged her to do a rapid COVID-19 test.  If she is positive, I will need to order Paxlovid in place of her azithromycin.       Diagnosis Orders   1. Upper respiratory tract infection, unspecified type  azithromycin (ZITHROMAX) 250 MG tablet      2. Heart transplant status ([place])        3. Chronic systolic (congestive) heart failure (HCC)        4. Elevation of levels of liver transaminase levels                Lab review: labs are reviewed in the EHR     I have discussed the diagnosis with the patient and the intended plan as seen in the above orders. I have discussed medication side effects and warnings with the patient as well. I have reviewed the plan of care with the patient, accepted their input and they are in agreement with the treatment goals. All questions were answered. The patient understands the plan of care. Pt instructed if symptoms worsen to call the office or report to the ED for continued care.  Greater than 50% of the visit time was spent in counseling and/or coordination of care.     Voice recognition was used to generate this report, which may have resulted in some phonetic based errors in grammar and contents. Even though attempts were made to correct all the mistakes, some may have been missed, and remained in the body of the document.      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    1. CHF/Transplant Hx:  Taking: CellCept, tacrolimus, aspirin, Lipitor, and Lasix prn. Sx: no SOB/CP.     2. URI: Today she reports: she has a h/o hoarseness, congestion, sneezing, chills, and coughing. Sputum: a little. Her sx began on Friday. She tried robitussin. "It just put me to sleep." No CP or SOB. She has not tested for Covid-19. No sick contacts.No emesis. +Nausea off/on. +Poor appetite. No fever. Her sx are worsening. No sore throat.     3.  Transaminitis history: A right upper quadrant ultrasound was ordered at her last visit but she has yet  to have this done.  Follow-up labs were also ordered. Her transplant team told her her transaminitis are likely from her anti-rejection rx.      Prior to Admission medications    Medication Sig Start Date End Date Taking? Authorizing Provider   azithromycin (ZITHROMAX) 250 MG tablet Take 1 tablet by mouth See Admin Instructions for 5 days '500mg'$  on day 1 followed by '250mg'$  on days 2 - 5 05/14/21 05/19/21 Yes Marius Ditch, MD   acetaminophen (TYLENOL) 325 MG tablet Take by mouth every 4 hours as needed    Ar Automatic Reconciliation   aspirin 325 MG tablet daily 10/01/20   Ar Automatic Reconciliation   atorvastatin  (LIPITOR) 10 MG tablet daily 10/20/20   Ar Automatic Reconciliation   brimonidine (ALPHAGAN) 0.2 % ophthalmic solution ceived the following from [facility]: Outside name: brimonidine (ALPHAGAN) 0.2 % ophthalmic solution 05/08/20   Ar Automatic Reconciliation   dorzolamide (TRUSOPT) 2 % ophthalmic solution ceived the following from [facility]: Outside name: dorzolamide (TRUSOPT) 2 % ophthalmic solution 04/02/20   Ar Automatic Reconciliation   furosemide (LASIX) 20 MG tablet Take 20 mg by mouth daily as needed 09/26/20   Ar Automatic Reconciliation   latanoprost (XALATAN) 0.005 % ophthalmic solution ceived the following from [facility]: Outside name: latanoprost (XALATAN) 0.005 % ophthalmic solution 05/08/20   Ar Automatic Reconciliation   melatonin 3 MG TABS tablet Take 3 mg by mouth 06/15/19   Ar Automatic Reconciliation   meloxicam (MOBIC) 7.5 MG tablet daily 10/01/20   Ar Automatic Reconciliation   mycophenolate (CELLCEPT) 500 MG tablet Take 250 mg by mouth 2 times daily    Ar Automatic Reconciliation   omeprazole (PRILOSEC) 20 MG delayed release capsule ceived the following from [facility]: Outside name: omeprazole (PRILOSEC) 20 mg capsule 06/20/20   Ar Automatic Reconciliation   prednisoLONE acetate (PRED FORTE) 1 % ophthalmic suspension ceived the following from [facility]: Outside name: prednisoLONE acetate (PRED FORTE) 1 % ophthalmic suspension 04/30/20   Ar Automatic Reconciliation   tacrolimus (PROGRAF) 1 MG capsule 8 mg 2 times daily 08/15/19   Ar Automatic Reconciliation     Allergies   Allergen Reactions    Penicillamine Itching    Penicillins Itching and Rash     Itching bumps on hands and arms  Itching bumps on hands and arms  Itching bumps on hands and arms  Has patient had a PCN reaction causing immediate rash, facial/tongue/throat swelling, SOB or lightheadedness with hypotension: Yes  Has patient had a PCN reaction causing  severe rash involving mucus membranes or skin necrosis: No  Has patient had a PCN reaction that required hospitalization unknown  Has patient had a PCN reaction occurring within the last 10 years: No  If all of the above answers are "NO", then may proceed with Cephalosporin use.    Itching bumps on hands and arms    Tramadol Other (See Comments)     Psychotic Hallucinations  Psychotic Hallucinations  hallucinations  Psychotic Hallucinations    Chlorhexidine Itching     CHG Wipes    Copper Itching     Past Medical History:   Diagnosis Date    Age related osteoporosis     Automatic implantable cardiac defibrillator in situ     Post ddd icd Set up carelink    Baker cyst, left 12/2016    Breast mass, left  benign    CHF (congestive heart failure) ([place]) 06/23/2008    Non-ischemic CMP , Cath (2008) Normal coronary arteries    Chronic systolic heart failure (HCC)     Stable,     Degenerative arthritis of left knee     Fibroid 06/23/2008    Gastric intestinal metaplasia     History of brain tumor 2013    HLD (hyperlipidemia)     HTN (hypertension) 06/23/2008    Hypotension, unspecified     Related to lisinopril    Hypothyroid 06/23/2008    radioactive iodine ablation of thyroid    Left knee pain     With swelling    Leg pain     LVAD (left ventricular assist device) present ([place]) 03/2017    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     OSA (obstructive sleep apnea) 10/2008    PUD (peptic ulcer disease) 2018    small superficial gastric ulcer    Venous reflux     Wears glasses      Past Surgical History:   Procedure Laterality Date    BREAST BIOPSY      rt breast, benign    CARDIAC DEFIBRILLATOR PLACEMENT      CHOLECYSTECTOMY      CRANIOTOMY  2013    meningioma     ESOPHAGEAL MOTILITY STUDY  02/16/2017         HEENT      glasses    HYSTERECTOMY (CERVIX STATUS UNKNOWN)      HYSTERECTOMY, TOTAL ABDOMINAL (CERVIX REMOVED)  1996    PACEMAKER  2013    dual chamber icd-removed 2020    PR CARDIAC SURG  PROCEDURE UNLIST  11/05/2018    heart transplant    TONSILLECTOMY AND ADENOIDECTOMY      TUBAL LIGATION       Family History   Problem Relation Age of Onset    Heart Disease Maternal Grandmother     Heart Attack Father         MI    Heart Disease Paternal Grandmother     Kidney Disease Other         1 sib deceased    Cancer Other         1 sib throat cancer    Diabetes Other     Diabetes Daughter     Other Daughter         leukemia    Hypertension Other     Diabetes Mother      Social History     Socioeconomic History    Marital status: Married   Tobacco Use    Smoking status: Never    Smokeless tobacco: Never   Substance and Sexual Activity    Alcohol use: Not Currently     Alcohol/week: 20.0 standard drinks    Drug use: Never       ROS:  Gen: No fever/chills  HEENT: No sore throat, eye pain, ear pain, or congestion. No HA  CV: No CP  Resp: No cough/SOB  GI: No abdominal pain  GU: No hematuria/dysuria  Derm: No rash  Neuro: No new paresthesias/weakness  Musc: No new myalgias/jt pain  Psych: No depression sx      Objective:     General: alert, cooperative, and no distress   Mental  status: mental status: alert, oriented to person, place, and time, normal mood, behavior, speech, dress, motor activity, and thought processes   Resp: Nonlabored breathing  Neuro: No focal neurologic deficits (new)   Skin: skin: no discoloration or lesions of concern on visible areas     LABS:  Lab Results   Component Value Date/Time    NA 139 08/03/2020 01:00 PM    K 5.1 12/31/2020 07:06 AM    CL 109 08/03/2020 01:00 PM    CO2 24 08/03/2020 01:00 PM    BUN 48 08/03/2020 01:00 PM    GFRAA 26 08/03/2020 01:00 PM       Lab Results   Component Value Date/Time    CHOL 170 08/30/2019 01:00 PM    HDL 64 08/30/2019 01:00 PM       Lab Results   Component Value Date/Time    WBC 6.1 08/30/2019 01:00 PM    HGBPOC 11.6 01/28/2018 02:24 PM    HGB 8.6 08/30/2019 01:00 PM    HCTPOC 34 01/28/2018 02:24 PM    HCT 30.3 08/30/2019 01:00 PM    PLT 212  08/30/2019 01:00 PM    MCV 88.9 08/30/2019 01:00 PM       No results found for: HBA1C, HBA1CPOC    Lab Results   Component Value Date/Time    TSH 2.54 08/30/2019 01:00 PM           Due to this being a TeleHealth  evaluation, many elements of the physical examination are unable to be assessed.     The pt was seen by synchronous (real-time) audio-video technology, and/or her healthcare decision maker, is aware that this patient-initiated, Telehealth encounter is a billable service, with coverage as determined by her insurance carrier. She is aware that she may receive a bill and has provided verbal consent to proceed: Yes. The patient (or guardian if applicable) is aware that this is a billable service, which includes applicable copays. This virtual visit was conducted with the patient's (and/or legal guardian's) consent. The pt is located in a state where the provider was licensed to provide care.     The pt is being evaluated by a video visit encounter for concerns as above. A caregiver was present when appropriate. Due to this being a Scientist, physiological (During XLKGM-01 public health emergency), evaluation of the following organ systems was limited: Vitals/Constitutional/EENT/Resp/CV/GI/GU/MS/Neuro/Skin/Heme-Lymph-Imm.   Pursuant to the emergency declaration under the [place], 1135 waiver authority and the [employer] Act, this Virtual  Visit was conducted, with patient's (and/or legal guardian's) consent, to reduce the patient's risk of exposure to COVID-19 and provide necessary medical care.     Services were provided through a video synchronous discussion virtually to substitute for in-person clinic visit. Patient and provider were located at their individual home/office respectively.      We discussed the expected course, resolution and complications of the diagnosis(es) in detail.  Medication risks, benefits, costs,  interactions, and alternatives were discussed as indicated.  I advised her to contact the office if her condition worsens, changes or fails to improve as anticipated. She expressed understanding with the diagnosis(es) and plan.     Marius Ditch, MD    Cathy [name], who was evaluated through a synchronous (real-time)  encounter, and/or her healthcare decision maker, is aware that it is a billable service, which includes applicable co-pays, with coverage as determined by her insurance carrier. She provided verbal consent to proceed and patient identification was verified. This visit was conducted pursuant to the emergency declaration under the [place], [id number]  waiver authority and the [employer] Act. A caregiver was present when appropriate. Ability to conduct physical exam was limited. The patient was located at: Home: [place] [address]  The provider was located at: Facility (Appt Dept): [place], [place] [address]    --Marius Ditch, MD on 05/14/2021 at 11:04 AM        Cathy [name], was evaluated through a synchronous (real-time) audio-video encounter. The patient (or guardian if applicable) is aware that this is a billable service, which includes applicable co-pays. This Virtual Visit was conducted with patient's (and/or legal guardian's) consent. The visit was conducted pursuant to the emergency declaration under the [place], [place] waiver authority and the [employer] Act.  Patient identification was verified, and a caregiver was present when appropriate.   The patient was located at Home: [place] [address]  Provider was located at [employer] (Appt Dept): [address] [place], [place] [address]

## 2021-06-27 ENCOUNTER — Encounter: Attending: Family Medicine | Primary: Family Medicine

## 2021-08-01 ENCOUNTER — Ambulatory Visit: Admit: 2021-08-01 | Discharge: 2021-08-01 | Payer: MEDICARE | Attending: Family Medicine | Primary: Family Medicine

## 2021-08-01 DIAGNOSIS — Z1231 Encounter for screening mammogram for malignant neoplasm of breast: Secondary | ICD-10-CM

## 2021-08-01 DIAGNOSIS — J309 Allergic rhinitis, unspecified: Secondary | ICD-10-CM

## 2021-08-01 DIAGNOSIS — Z0000 Encounter for general adult medical examination without abnormal findings: Secondary | ICD-10-CM

## 2021-08-01 MED ORDER — PREDNISONE 20 MG PO TABS
20 MG | ORAL_TABLET | Freq: Every day | ORAL | 0 refills | Status: AC
Start: 2021-08-01 — End: 2021-08-06

## 2021-08-01 NOTE — Progress Notes (Unsigned)
1. "Have you been to the ER, urgent care clinic since your last visit?  Hospitalized since your last visit?" No    2. "Have you seen or consulted any other health care providers outside of the [facility] since your last visit?"  Yes, [facility] advanced heart failure       3. For patients aged 71-75: Has the patient had a colonoscopy / FIT/ Cologuard? Yes - no Care Gap present      If the patient is female:    4. For patients aged 68-74: Has the patient had a mammogram within the past 2 years? Yes - no Care Gap present      5. For patients aged 21-65: Has the patient had a pap smear? NA - based on age or sex

## 2021-08-01 NOTE — Patient Instructions (Signed)
Learning About Dental Care for Older Adults  Dental care for older adults: Overview  Dental care for older people is much the same as for younger adults. But older adults do have concerns that younger adults do not. Older adults may have problems with gum disease and decay on the roots of their teeth. They may need missing teeth replaced or broken fillings fixed. Or they may have dentures that need to be cared for. Some older adults may have trouble holding a toothbrush.  You can help remind the person you are caring for to brush and floss their teeth or to clean their dentures. In some cases, you may need to do the brushing and other dental care tasks. People who have trouble using their hands or who have dementia may need this extra help.  How can you help with dental care?  Normal dental care  To keep the teeth and gums healthy:  Brush the teeth with fluoride toothpaste twice a day--in the morning and at night--and floss at least once a day. Plaque can quickly build up on the teeth of older adults.  Watch for the signs of gum disease. These signs include gums that bleed after brushing or after eating hard foods, such as apples.  See a dentist regularly. Many experts recommend checkups every 6 months.  Keep the dentist up to date on any new medications the person is taking.  Encourage a balanced diet that includes whole grains, vegetables, and fruits, and that is low in saturated fat and sodium.  Encourage the person you're caring for not to use tobacco products. They can affect dental and general health.  Many older adults have a fixed income and feel that they can't afford dental care. But most towns and cities have programs in which dentists help older adults by lowering fees. Contact your area's public health offices or social services for information about dental care in your area.  Using a toothbrush  Older adults with arthritis sometimes have trouble brushing their teeth because they can't easily hold  the toothbrush. Their hands and fingers may be stiff, painful, or weak. If this is the case, you can:  Offer an IT trainer toothbrush.  Enlarge the handle of a non-electric toothbrush by wrapping a sponge, an elastic bandage, or adhesive tape around it.  Push the toothbrush handle through a ball made of rubber or soft foam.  Make the handle longer and thicker by taping Popsicle sticks or tongue depressors to it.  You may also be able to buy special toothbrushes, toothpaste dispensers, and floss holders.  Your doctor may recommend a soft-bristle toothbrush if the person you care for bleeds easily. Bleeding can happen because of a health problem or from certain medicines.  A toothpaste for sensitive teeth may help if the person you care for has sensitive teeth.  How do you brush and floss someone's teeth?  If the person you are caring for has a hard time cleaning their teeth on their own, you may need to brush and floss their teeth for them. It may be easiest to have the person sit and face away from you, and to sit or stand behind them. That way you can steady their head against your arm as you reach around to floss and brush their teeth. Choose a place that has good lighting and is comfortable for both of you.  Before you begin, gather your supplies. You will need gloves, floss, a toothbrush, and a container to hold water if you  are not near a sink. Wash and dry your hands well and put on gloves. Start by flossing:  Gently work a piece of floss between each of the teeth toward the gums. A plastic flossing tool may make this easier, and they are available at most drugstores.  Curve the floss around each tooth into a U-shape and gently slide it under the gum line.  Move the floss firmly up and down several times to scrape off the plaque.  After you've finished flossing, throw away the used floss and begin brushing:  Wet the brush and apply toothpaste.  Place the brush at a 45-degree angle where the teeth meet the gums.  Press firmly, and move the brush in small circles over the surface of the teeth.  Be careful not to brush too hard. Vigorous brushing can make the gums pull away from the teeth and can scratch the tooth enamel.  Brush all surfaces of the teeth, on the tongue side and on the cheek side. Pay special attention to the front teeth and all surfaces of the back teeth.  Brush chewing surfaces with short back-and-forth strokes.  After you've finished, help the person rinse the remaining toothpaste from their mouth.  Where can you learn more?  Go to https://www.bennett.info/ and enter F944 to learn more about "[facility] for Older Adults."  Current as of: November 14, 2022Content Version: 13.6   2006-2023 [employer].   Care instructions adapted under license by [facility]. If you have questions about a medical condition or this instruction, always ask your healthcare professional. [place] any warranty or liability for your use of this information.           Learning About Vision Tests  What are vision tests?     The four most common vision tests are visual acuity tests, refraction, visual field tests, and color vision tests.  Visual acuity (sharpness) tests  These tests are used:  To see if you need glasses or contact lenses.  To monitor an eye problem.  To check an eye injury.  Visual acuity tests are done as part of routine exams. You may also have this test when you get your driver's license or apply for some types of jobs.  Visual field tests  These tests are used:  To check for vision loss in any area of your range of vision.  To screen for certain eye diseases.  To look for nerve damage after a stroke, head injury, or other problem that could reduce blood flow to the brain.  Refraction and color tests  A refraction test is done to find the right prescription for glasses and contact lenses.  A color vision test is done to check for  color blindness.  Color vision is often tested as part of a routine exam. You may also have this test when you apply for a job where recognizing different colors is important, such as truck driving, Research officer, trade union, or the [employer].  How are vision tests done?  Visual acuity test   You cover one eye at a time.  You read aloud from a wall chart across the room.  You read aloud from a small card that you hold in your hand.  Refraction   You look into a special device.  The device puts lenses of different strengths in front of each eye to see how strong your glasses or contact lenses need to be.  Visual field tests   Your doctor may  have you look through special machines.  Or your doctor may simply have you stare straight ahead while they move a finger into and out of your field of vision.  Color vision test   You look at pieces of printed test patterns in various colors. You say what number or symbol you see.  Your doctor may have you trace the number or symbol using a pointer.  How do these tests feel?  There is very little chance of having a problem from this test. If dilating drops are used for a vision test, they may make the eyes sting and cause a medicine taste in the mouth.  Follow-up care is a key part of your treatment and safety. Be sure to make and go to all appointments, and call your doctor if you are having problems. It's also a good idea to know your test results and keep a list of the medicines you take.  Where can you learn more?  Go to https://www.bennett.info/ and enter G551 to learn more about "Learning About Vision Tests."  Current as of: October 12, 2022Content Version: 13.6   2006-2023 Health[employer], Inc[employer].   Care instructions adapted under license by [facility]. If you have questions about a medical condition or this instruction, always ask your healthcare professional. [place] any warranty or liability for your use of this  information.           A Healthy Heart: Care Instructions  Your Care Instructions     Coronary artery disease, also called heart disease, occurs when a substance called plaque builds up in the vessels that supply oxygen-rich blood to your heart muscle. This can narrow the blood vessels and reduce blood flow. A heart attack happens when blood flow is completely blocked. A high-fat diet, smoking, and other factors increase the risk of heart disease.  Your doctor has found that you have a chance of having heart disease. You can do lots of things to keep your heart healthy. It may not be easy, but you can change your diet, exercise more, and quit smoking. These steps really work to lower your chance of heart disease.  Follow-up care is a key part of your treatment and safety. Be sure to make and go to all appointments, and call your doctor if you are having problems. It's also a good idea to know your test results and keep a list of the medicines you take.  How can you care for yourself at home?  Diet   Use less salt when you cook and eat. This helps lower your blood pressure. Taste food before salting. Add only a little salt when you think you need it. With time, your taste buds will adjust to less salt.    Eat fewer snack items, fast foods, canned soups, and other high-salt, high-fat, processed foods.    Read food labels and try to avoid saturated and trans fats. They increase your risk of heart disease by raising cholesterol levels.    Limit the amount of solid fat-butter, margarine, and shortening-you eat. Use olive, peanut, or canola oil when you cook. Bake, broil, and steam foods instead of frying them.    Eat a variety of fruit and vegetables every day. Dark green, deep orange, red, or yellow fruits and vegetables are especially good for you. Examples include spinach, carrots, peaches, and berries.    Foods high in fiber can reduce your cholesterol and provide important vitamins and minerals. High-fiber  foods include whole-grain cereals  and breads, oatmeal, beans, brown rice, citrus fruits, and apples.    Eat lean proteins. Heart-healthy proteins include seafood, lean meats and poultry, eggs, beans, peas, nuts, seeds, and soy products.    Limit drinks and foods with added sugar. These include candy, desserts, and soda pop.   Lifestyle changes   If your doctor recommends it, get more exercise. Walking is a good choice. Bit by bit, increase the amount you walk every day. Try for at least 30 minutes on most days of the week. You also may want to swim, bike, or do other activities.    Do not smoke. If you need help quitting, talk to your doctor about stop-smoking programs and medicines. These can increase your chances of quitting for good. Quitting smoking may be the most important step you can take to protect your heart. It is never too late to quit.    Limit alcohol to 2 drinks a day for men and 1 drink a day for women. Too much alcohol can cause health problems.    Manage other health problems such as diabetes, high blood pressure, and high cholesterol. If you think you may have a problem with alcohol or drug use, talk to your doctor.   Medicines   Take your medicines exactly as prescribed. Call your doctor if you think you are having a problem with your medicine.    If your doctor recommends aspirin, take the amount directed each day. Make sure you take aspirin and not another kind of pain reliever, such as acetaminophen (Tylenol).   When should you call for help?   Call 911 if you have symptoms of a heart attack. These may include:   Chest pain or pressure, or a strange feeling in the chest.    Sweating.    Shortness of breath.    Pain, pressure, or a strange feeling in the back, neck, jaw, or upper belly or in one or both shoulders or arms.    Lightheadedness or sudden weakness.    A fast or irregular heartbeat.   After you call 911, the operator may tell you to chew 1 adult-strength or 2 to 4  low-dose aspirin. Wait for an ambulance. Do not try to drive yourself.  Watch closely for changes in your health, and be sure to contact your doctor if you have any problems.  Where can you learn more?  Go to https://www.bennett.info/ and enter F075 to learn more about "A Healthy Heart: Care Instructions."  Current as of: September 7, 2022Content Version: 13.6   2006-2023 [employer], [employer].   Care instructions adapted under license by [facility]. If you have questions about a medical condition or this instruction, always ask your healthcare professional. [place] any warranty or liability for your use of this information.      Personalized Preventive Plan for Cathy [name] - 08/01/2021  Medicare offers a range of preventive health benefits. Some of the tests and screenings are paid in full while other may be subject to a deductible, co-insurance, and/or copay.    Some of these benefits include a comprehensive review of your medical history including lifestyle, illnesses that may run in your family, and various assessments and screenings as appropriate.    After reviewing your medical record and screening and assessments performed today your provider may have ordered immunizations, labs, imaging, and/or referrals for you.  A list of these orders (if applicable) as well as your Preventive Care list are included within your After Visit  Summary for your review.    Other Preventive Recommendations:    A preventive eye exam performed by an eye specialist is recommended every 1-2 years to screen for glaucoma; cataracts, macular degeneration, and other eye disorders.  A preventive dental visit is recommended every 6 months.  Try to get at least 150 minutes of exercise per week or 10,000 steps per day on a pedometer .  Order or download the FREE "Exercise & Physical Activity: Your Everyday Guide" from [employer]. Call (808)849-5328 or  search [employer] online.  You need 1200-1500 mg of calcium and 1000-2000 IU of vitamin D per day. It is possible to meet your calcium requirement with diet alone, but a vitamin D supplement is usually necessary to meet this goal.  When exposed to the sun, use a sunscreen that protects against both UVA and UVB radiation with an SPF of 30 or greater. Reapply every 2 to 3 hours or after sweating, drying off with a towel, or swimming.  Always wear a seat belt when traveling in a car. Always wear a helmet when riding a bicycle or motorcycle.

## 2021-08-01 NOTE — Progress Notes (Unsigned)
[facility]:  08/01/2021, MRN: [id number]      Cathy [name] is a 71 y.o. female and presents to clinic for follow up and a  Medicare AWV      Subjective:   The patient is a 71 year old female with history of hypertension, chronic systolic heart failure (followed by Dr.McCray) with EF of 10%, mitral regurgitation, cholelithiasis, asthma, multinodular goiter, obstructive sleep apnea (not on rx), allergic rhinitis, osteoporosis, uterine fibroids per EHR, insomnia, cataracts, dysphagia from achalasia?, LVAD placement in 2019, AICD placement, S/p heart transplant (2020, followed by [facility]), seizure, left distal radial artery occlusion (2020), status post removal of LLE schwannoma by Dr.Balsalmo, left Baker's cyst, Covid-19 (2021), hyperlipidemia, and meningioma status post resection.     1.  CHF: She has history of a heart transplant.  She is taking CellCept 250 mg twice daily and tacrolimus 8 mg twice daily.  She also takes 325 mg daily of aspirin, Lipitor 10 mg daily, and Lasix 20 mg daily as needed.  She has a history of transaminitis likely secondary to her transplant anti--rejection medications.  Today she reports: no CP.     She had labs in April per review of the [facility] system that showed her hemoglobin to be 10.6.  This is her baseline.  Her creatinine was 1.1 on April 10.  Her estimated GFR was 51 per review of the [facility] system.    2. Asthma and OSA: Not treated. SOB/cough: none. Snoring Hx: none. She went to a graduation yesterday.  +Rhinorrhea x 1 day. She tried robitussin last night.     3.  Multinodular goiter: Her last ultrasound was in April of last year.    06/05/20 Thyroid ultrasound: No significant change in size or imaging characteristics of left thyroid nodules measuring 1.6 x 1.3 x 1.6 cm and 1.3 x 1.2 x 1.1 cm. New 1.1 cm TR 3 Isthmus nodule not requiring FNA or follow-up required per TI-RADS criteria. New subcentimeter left upper spongiform nodule. No FNA or follow-up  required TI-RADS criteria.     4. Health Maintenance:  - Overdue for Tdap, Covid, and shingles vaccinations but she is utd but we don't have records.   - No bleeding  - No changes to vision/hearing. She has blurry vision b/l. She has double vision along her right eye. Her right eye vision is worse than her left eye vision. She is followed by Ophthalmology. +Glaucoma.   - No changes to her advance directive.       ***1. CHF/Transplant Hx:  Taking: CellCept, tacrolimus, aspirin, Lipitor, and Lasix prn. Sx: no SOB/CP.      2. URI: Today she reports: she has a h/o hoarseness, congestion, sneezing, chills, and coughing. Sputum: a little. Her sx began on Friday. She tried robitussin. "It just put me to sleep." No CP or SOB. She has not tested for Covid-19. No sick contacts.No emesis. +Nausea off/on. +Poor appetite. No fever. Her sx are worsening. No sore throat.      3.  Transaminitis history: A right upper quadrant ultrasound was ordered at her last visit but she has yet to have this done.  Follow-up labs were also ordered. Her transplant team told her her transaminitis are likely from her anti-rejection rx.              Patient Active Problem List    Diagnosis Date Noted    Heart transplant status (HCC) 05/14/2021    Abnormal MRI 04/30/2020    Mild persistent asthma without  complication 08/30/2019    History of seizures 08/30/2019    Thyroid nodule 08/30/2019    Anemia 08/30/2019    Depression 08/30/2019    Esophageal dysphagia 03/17/2017    Multinodular goiter 03/04/2016    History of meningioma 03/04/2016    Age-related osteoporosis without current pathological fracture 03/04/2016    Mixed hyperlipidemia     Chronic systolic heart failure (HCC)     Non-rheumatic mitral regurgitation 04/25/2014    AR (allergic rhinitis) 09/29/2011    AICD (automatic cardioverter/defibrillator) present 11/15/2010    Cataract 12/01/2008    Gallstones 11/13/2008    OSA (obstructive sleep apnea) 11/13/2008    Fibroid 06/23/2008    HTN  (hypertension) 06/23/2008       Current Outpatient Medications   Medication Sig Dispense Refill    acetaminophen (TYLENOL) 325 MG tablet Take by mouth every 4 hours as needed      aspirin 325 MG tablet daily      atorvastatin (LIPITOR) 10 MG tablet daily      brimonidine (ALPHAGAN) 0.2 % ophthalmic solution ceived the following from [facility]: Outside name: brimonidine (ALPHAGAN) 0.2 % ophthalmic solution      dorzolamide (TRUSOPT) 2 % ophthalmic solution ceived the following from [facility]: Outside name: dorzolamide (TRUSOPT) 2 % ophthalmic solution      furosemide (LASIX) 20 MG tablet Take 20 mg by mouth daily as needed      latanoprost (XALATAN) 0.005 % ophthalmic solution ceived the following from [facility]: Outside name: latanoprost (XALATAN) 0.005 % ophthalmic solution      melatonin 3 MG TABS tablet Take 3 mg by mouth      meloxicam (MOBIC) 7.5 MG tablet daily      mycophenolate (CELLCEPT) 500 MG tablet Take 250 mg by mouth 2 times daily      omeprazole (PRILOSEC) 20 MG delayed release capsule ceived the following from [facility]: Outside name: omeprazole (PRILOSEC) 20 mg capsule      prednisoLONE acetate (PRED FORTE) 1 % ophthalmic suspension ceived the following from [facility]: Outside name: prednisoLONE acetate (PRED FORTE) 1 % ophthalmic suspension      tacrolimus (PROGRAF) 1 MG capsule 8 mg 2 times daily       No current facility-administered medications for this visit.       Allergies   Allergen Reactions    Penicillamine Itching    Penicillins Itching and Rash     Itching bumps on hands and arms  Itching bumps on hands and arms  Itching bumps on hands and arms  Has patient had a PCN reaction causing immediate rash, facial/tongue/throat swelling, SOB or lightheadedness with hypotension: Yes  Has patient had a PCN reaction causing severe rash involving mucus membranes or skin necrosis: No  Has patient had a PCN  reaction that required hospitalization unknown  Has patient had a PCN reaction occurring within the last 10 years: No  If all of the above answers are "NO", then may proceed with Cephalosporin use.    Itching bumps on hands and arms    Tramadol Other (See Comments)     Psychotic Hallucinations  Psychotic Hallucinations  hallucinations  Psychotic Hallucinations    Chlorhexidine Itching     CHG Wipes    Copper Itching       Past Medical History:   Diagnosis Date    Age related osteoporosis     Automatic  implantable cardiac defibrillator in situ     Post ddd icd Set up carelink    Baker cyst, left 12/2016    Breast mass, left     benign    CHF (congestive heart failure) (HCC) 06/23/2008    Non-ischemic CMP , Cath (2008) Normal coronary arteries    Chronic systolic heart failure (HCC)     Stable,     Degenerative arthritis of left knee     Fibroid 06/23/2008    Gastric intestinal metaplasia     History of brain tumor 2013    HLD (hyperlipidemia)     HTN (hypertension) 06/23/2008    Hypotension, unspecified     Related to lisinopril    Hypothyroid 06/23/2008    radioactive iodine ablation of thyroid    Left knee pain     With swelling    Leg pain     LVAD (left ventricular assist device) present (HCC) 03/2017    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     OSA (obstructive sleep apnea) 10/2008    PUD (peptic ulcer disease) 2018    small superficial gastric ulcer    Venous reflux     Wears glasses        Past Surgical History:   Procedure Laterality Date    BREAST BIOPSY      rt breast, benign    CARDIAC DEFIBRILLATOR PLACEMENT      CHOLECYSTECTOMY      CRANIOTOMY  2013    meningioma     ESOPHAGEAL MOTILITY STUDY  02/16/2017         HEENT      glasses    HYSTERECTOMY (CERVIX STATUS UNKNOWN)      HYSTERECTOMY, TOTAL ABDOMINAL (CERVIX REMOVED)  1996    PACEMAKER  2013    dual chamber icd-removed 2020    PR UNLISTED PROCEDURE CARDIAC SURGERY  11/05/2018    heart transplant    TONSILLECTOMY AND  ADENOIDECTOMY      TUBAL LIGATION         Family History   Problem Relation Age of Onset    Heart Disease Maternal Grandmother     Heart Attack Father         MI    Heart Disease Paternal Grandmother     Kidney Disease Other         1 sib deceased    Cancer Other         1 sib throat cancer    Diabetes Other     Diabetes Daughter     Other Daughter         leukemia    Hypertension Other     Diabetes Mother        Social History     Tobacco Use    Smoking status: Never    Smokeless tobacco: Never   Substance Use Topics    Alcohol use: Not Currently     Alcohol/week: 20.0 standard drinks       ROS   Review of Systems   Eyes:  Positive for visual disturbance.   Musculoskeletal:  Positive for neck pain (off/on).   Neurological:  Positive for numbness (left hand thumb is numb and it radiates up to her forearm - sx are off/on. She declines seeing an orthopedist.).       Objective     BP 125/65 (Site: Left Upper Arm, Position: Sitting, Cuff Size: Large Adult)   Pulse 82  Temp 97.4 F (36.3 C) (Temporal)   Resp 12   Ht 5\' 10"  (1.778 m)   Wt 147 lb 6.4 oz (66.9 kg)   SpO2 99%   BMI 21.15 kg/m      Physical Exam  HENT:      Right Ear: There is no impacted cerumen.      Left Ear: There is no impacted cerumen.         LABS   Data Review:   Lab Results   Component Value Date/Time    WBC 6.1 08/30/2019 01:00 PM    HGBPOC 11.6 01/28/2018 02:24 PM    HGB 8.6 08/30/2019 01:00 PM    HCTPOC 34 01/28/2018 02:24 PM    HCT 30.3 08/30/2019 01:00 PM    PLT 212 08/30/2019 01:00 PM    MCV 88.9 08/30/2019 01:00 PM       Lab Results   Component Value Date/Time    NA 139 08/03/2020 01:00 PM    K 5.1 12/31/2020 07:06 AM    CL 109 08/03/2020 01:00 PM    CO2 24 08/03/2020 01:00 PM    BUN 48 08/03/2020 01:00 PM    GFRAA 26 08/03/2020 01:00 PM       Lab Results   Component Value Date/Time    CHOL 170 08/30/2019 01:00 PM    HDL 64 08/30/2019 01:00 PM       No results found for: HBA1C, HBA1CPOC    Assessment/Plan:   There are no diagnoses  linked to this encounter.      No diagnosis found.      Health Maintenance Due   Topic Date Due    DTaP/Tdap/Td vaccine (1 - Tdap) Never done    Shingles vaccine (1 of 2) Never done    COVID-19 Vaccine (3 - Pfizer risk series) 06/09/2019    Depression Monitoring  08/03/2021    GFR test (Diabetes, CKD 3-4, OR last GFR 15-59)  08/03/2021    Annual Wellness Visit (AWV)  08/04/2021         Lab review: {lab reviewed:315731}    I have discussed the diagnosis with the patient and the intended plan as seen in the above orders.  The patient has received an after-visit summary and questions were answered concerning future plans.  I have discussed medication side effects and warnings with the patient as well. I have reviewed the plan of care with the patient, accepted their input and they are in agreement with the treatment goals. All questions were answered. The patient understands the plan of care. Handouts provided today with above information. ***Pt instructed if symptoms worsen to call the office or report to the ED for continued care.  ***Greater than 50% of the visit time was spent in counseling and/or coordination of care.  ***The patient was counseled on the dangers of tobacco use, and was {MU SMOKING CESSATION COUNSELING:20090::"advised to quit"}.  Reviewed strategies to maximize success, including {techniques:20091}.3-10 minutes were spent on smoking/tobacco cessation    Voice recognition was used to generate this report, which may have resulted in some phonetic based errors in grammar and contents. Even though attempts were made to correct all the mistakes, some may have been missed, and remained in the body of the document.      No follow-up provider specified.    Darnelle Going, MDMedicare Annual Wellness Visit    Cathy [name] is here for Medicare AWV    Assessment & Plan   Medicare annual wellness visit,  subsequent  Recommendations for Preventive Services Due: see orders and patient  instructions/AVS.  Recommended screening schedule for the next 5-10 years is provided to the patient in written form: see Patient Instructions/AVS.     No follow-ups on file.     Subjective   {OPTIONAL - WILL AUTO-DELETE IF NOT JWJX:[id number]}    Patient's complete Health Risk Assessment and screening values have been reviewed and are found in Flowsheets. The following problems were reviewed today and where indicated follow up appointments were made and/or referrals ordered.    Positive Risk Factor Screenings with Interventions:                  Dentist Screen:  Have you seen the dentist within the past year?: (!) No    Intervention:  {Dentist Interventions:201460101}     Vision Screen:  Do you have difficulty driving, watching TV, or doing any of your daily activities because of your eyesight?: (!) Yes (had eye surgery)  Have you had an eye exam within the past year?: Yes  No results found.    Interventions:   {Vision Interventions:210200101}        {OPTIONAL- LDCT, CVD, STI Counseling [phone]}              Objective   Vitals:    08/01/21 0928 08/01/21 0942   BP: (!) 113/54 125/65   Site: Left Upper Arm Left Upper Arm   Position: Sitting Sitting   Cuff Size: Large Adult Large Adult   Pulse: 82 82   Resp: 12    Temp: 97.4 F (36.3 C)    TempSrc: Temporal    SpO2: 99%    Weight: 147 lb 6.4 oz (66.9 kg)    Height: 5\' 10"  (1.778 m)       Body mass index is 21.15 kg/m.        {OPTIONAL - GENERAL PHYSICAL EXAM (WILL AUTO-DELETE IF NOT USED):210460721}       Allergies   Allergen Reactions    Penicillamine Itching    Penicillins Itching and Rash     Itching bumps on hands and arms  Itching bumps on hands and arms  Itching bumps on hands and arms  Has patient had a PCN reaction causing immediate rash, facial/tongue/throat swelling, SOB or lightheadedness with hypotension: Yes  Has patient had a PCN reaction causing severe rash involving mucus membranes or skin necrosis: No  Has patient had a PCN reaction  that required hospitalization unknown  Has patient had a PCN reaction occurring within the last 10 years: No  If all of the above answers are "NO", then may proceed with Cephalosporin use.    Itching bumps on hands and arms    Tramadol Other (See Comments)     Psychotic Hallucinations  Psychotic Hallucinations  hallucinations  Psychotic Hallucinations    Chlorhexidine Itching     CHG Wipes    Copper Itching     Prior to Visit Medications    Medication Sig Taking? Authorizing Provider   acetaminophen (TYLENOL) 325 MG tablet Take by mouth every 4 hours as needed  Ar Automatic Reconciliation   aspirin 325 MG tablet daily  Ar Automatic Reconciliation   atorvastatin (LIPITOR) 10 MG tablet daily  Ar Automatic Reconciliation   brimonidine (ALPHAGAN) 0.2 % ophthalmic solution ceived the following from [facility]: Outside name: brimonidine (ALPHAGAN) 0.2 % ophthalmic solution  Ar Automatic Reconciliation   dorzolamide (TRUSOPT) 2 % ophthalmic solution ceived the following from [facility]: Outside name: dorzolamide (TRUSOPT) 2 % ophthalmic solution  Ar Automatic Reconciliation   furosemide (LASIX) 20 MG tablet Take 20 mg by mouth daily as needed  Ar Automatic Reconciliation   latanoprost (XALATAN) 0.005 % ophthalmic solution ceived the following from [facility]: Outside name: latanoprost (XALATAN) 0.005 % ophthalmic solution  Ar Automatic Reconciliation   melatonin 3 MG TABS tablet Take 3 mg by mouth  Ar Automatic Reconciliation   meloxicam (MOBIC) 7.5 MG tablet daily  Ar Automatic Reconciliation   mycophenolate (CELLCEPT) 500 MG tablet Take 250 mg by mouth 2 times daily  Ar Automatic Reconciliation   omeprazole (PRILOSEC) 20 MG delayed release capsule ceived the following from [facility]: Outside name: omeprazole (PRILOSEC) 20 mg capsule  Ar Automatic Reconciliation   prednisoLONE acetate (PRED FORTE) 1 % ophthalmic suspension ceived the following from [facility]: Outside name: prednisoLONE acetate (PRED FORTE) 1 % ophthalmic suspension  Ar Automatic Reconciliation   tacrolimus (PROGRAF) 1 MG capsule 8 mg 2 times daily  Ar Automatic Reconciliation       CareTeam (Including outside providers/suppliers regularly involved in providing care):   Patient Care Team:  Darnelle Going, MD as PCP - General  Darnelle Going, MD as PCP - Empaneled Provider     Reviewed and updated this visit:  Allergies  Med Hx  Surg Hx  Soc Hx  Fam Hx

## 2021-08-22 ENCOUNTER — Telehealth

## 2021-08-22 MED ORDER — PROMETHAZINE-CODEINE 6.25-10 MG/5ML PO SYRP
Freq: Four times a day (QID) | ORAL | 0 refills | Status: AC | PRN
Start: 2021-08-22 — End: 2021-08-29

## 2021-08-22 MED ORDER — BENZONATATE 100 MG PO CAPS
100 MG | ORAL_CAPSULE | Freq: Three times a day (TID) | ORAL | 1 refills | Status: AC | PRN
Start: 2021-08-22 — End: 2022-12-30

## 2021-08-22 NOTE — Telephone Encounter (Signed)
-----   Message from [place] sent at 08/22/2021  9:05 AM EDT -----  Regarding: Cough  Contact: [phone]  After my visit on June 5th, I was given an antibiotics to clear up my nose. It worked and my nose is clear, however; I have a terrible cough that will not go away. May I have some cough medication please. Thank you.

## 2021-08-22 NOTE — Telephone Encounter (Signed)
Please advise if cough medication can be called into the pharmacy.

## 2021-08-22 NOTE — Telephone Encounter (Signed)
Please let her know that I ordered benzonatate to be used as needed for cough symptoms.  I also ordered a strong cough syrup (Phenergan with codeine) to be used as needed as well.      Dr. Marius Ditch  [facility], [place] [address]  Phone: [phone]  Fax: [phone]

## 2021-08-22 NOTE — Addendum Note (Signed)
Addended by: Azzie Almas A on: 08/22/2021 05:09 PM     Modules accepted: Orders

## 2021-08-23 NOTE — Telephone Encounter (Signed)
Contacted and advised patient.  No further questions.

## 2021-08-26 NOTE — ACP (Advance Care Planning) (Signed)
Advance Care Planning     General Advance Care Planning (ACP) Conversation    Date of Conversation: 08/01/21    Conducted with: Patient with Decision Making Capacity    Healthcare Decision Maker:  Today we documented Decision Maker(s) consistent with ACP documents on file.    Content/Action Overview:  Has ACP document(s) on file - reflects the patient's care preferences  She has no changes to make to her preexisting advance directive.    Length of Voluntary ACP Conversation in minutes:  <16 minutes (Non-Billable)    Marius Ditch, MD

## 2021-09-04 ENCOUNTER — Inpatient Hospital Stay: Admit: 2021-09-04 | Payer: MEDICARE | Attending: Family Medicine | Primary: Family Medicine

## 2021-09-04 ENCOUNTER — Encounter

## 2021-09-04 DIAGNOSIS — Z1231 Encounter for screening mammogram for malignant neoplasm of breast: Secondary | ICD-10-CM

## 2021-09-04 DIAGNOSIS — E042 Nontoxic multinodular goiter: Secondary | ICD-10-CM

## 2021-11-02 LAB — HEMOGLOBIN A1C
Estimated Avg Glucose, External: 126 mg/dL — ABNORMAL HIGH (ref 91–123)
Hemoglobin A1C, External: 6 % — ABNORMAL HIGH (ref 4.8–5.6)

## 2021-12-03 ENCOUNTER — Ambulatory Visit: Payer: MEDICARE | Attending: Family Medicine | Primary: Family Medicine

## 2021-12-04 ENCOUNTER — Ambulatory Visit: Admit: 2021-12-04 | Discharge: 2021-12-04 | Payer: MEDICARE | Attending: Family Medicine | Primary: Family Medicine

## 2021-12-04 DIAGNOSIS — R739 Hyperglycemia, unspecified: Secondary | ICD-10-CM

## 2021-12-04 DIAGNOSIS — E042 Nontoxic multinodular goiter: Secondary | ICD-10-CM

## 2021-12-04 NOTE — Progress Notes (Signed)
Felicie Kocher presents today for   Chief Complaint   Patient presents with    Congestive Heart Failure    Goiter    Allergic Rhinitis      BP 117/69 (Site: Left Upper Arm, Position: Sitting, Cuff Size: Medium Adult)   Pulse 84   Temp 98.2 F (36.8 C) (Temporal)   Resp 18   Ht '5\' 10"'$  (1.778 m)   Wt 145 lb 2 oz (65.8 kg)   SpO2 100%   BMI 20.82 kg/m      1. "Have you been to the ER, urgent care clinic since your last visit?  Hospitalized since your last visit?" No     2. "Have you seen or consulted any other health care providers outside of the [facility] since your last visit?" No      3. For patients aged 100-75: Has the patient had a colonoscopy / FIT/ Cologuard? Yes - no Care Gap present

## 2021-12-04 NOTE — Progress Notes (Unsigned)
[facility]:  12/04/2021, MRN: [id number]      Cathy [name] is a 71 y.o. female and presents to clinic for Congestive Heart Failure, Goiter, and Allergic Rhinitis       Subjective:   The patient is a 71 year old female with history of hypertension, chronic systolic heart failure (followed by Dr.McCray) with EF of 10%, mitral regurgitation, prediabetes, cholelithiasis, asthma, multinodular goiter, obstructive sleep apnea (not on rx), allergic rhinitis, osteoporosis, uterine fibroids per EHR, insomnia, cataracts, dysphagia from achalasia?, LVAD placement in 2019, AICD placement, S/p heart transplant (2020, followed by [facility] seizure, left distal radial artery occlusion (2020), status post removal of LLE schwannoma by Dr.Balsalmo, left Baker's cyst, Covid-19 (2021), hyperlipidemia, and meningioma status post resection.     1.  Multinodular goiter: At her last appointment, I ordered a head neck ultrasound along with TFTs.  Her TFTs were not checked but her ultrasound was obtained.  Findings are listed below.    Thyroid ultrasound 09/04/21: Both lobes show hypervascularity as before  2. Stable complex nodules in the left lobe.  3. Slightly larger nodule in the isthmus. Still TR-3 and less than 1.5 cm.  However, with interval growth, follow-up in one year recommended.    2.  Allergic rhinitis: At her last appointment, I ordered a short course of prednisone and encouraged her to use an antihistamine over-the-counter for presumed allergic rhinitis related symptoms.  PE findings were not consistent with an infection.  Today she reports: she does not believe she has seasonal allergies. She does not take antihistamine rx and flonase. She has a productive cough - with white sputum. No fever. S/p chest CT***    Chest CT 09/03/21: Stable opacities in the RIGHT lung with volume loss since 07/04/2020. No new consolidation. Probably chronic scarring. Not likely neoplasm.    3.  Heart transplant/CHF/CKD:  She has anemia of chronic disease.  She is on CellCept 250 mg twice daily and tacrolimus 8 mg twice daily.  She will take 325 mg daily of aspirin, Lipitor 10 mg daily, and Lasix 20 mg daily.  Next appoint with her cardiology/transplant team: December. She saw them last month.  Asymptomatic.    I reviewed her September labs from the [facility] system.  Her hemoglobin on September 8 measured 10.5.  MCV was normal.  Her white blood cell count was mildly low at 2.7 but this is her baseline.  Her creatinine was 1.1.  Her estimated GFR was 51.      4.  Prediabetes: Her A1c measured 6 on September 8.    5. Vitamin D deficiency: I reviewed her vitamin D level in the [facility] system from September 8.  Her vitamin D level is mildly low at 31.6. She does not take otc vitamin d consistently.               ***1.  CHF/Heart Transplant/CKD Hx: She has history of a heart transplant.  She is taking CellCept 250 mg twice daily and tacrolimus 8 mg twice daily.  She also takes 325 mg daily of aspirin, Lipitor 10 mg daily, and Lasix 20 mg daily as needed.  She has a history of transaminitis likely secondary to her transplant anti--rejection medications.  Today she reports: no CP.      She had labs in April per review of the [facility] system that showed her hemoglobin to be 10.6.  This is her baseline.  Her creatinine was 1.1 on April 10.  Her estimated GFR was 51 per  review of the [facility] system.     2. Allergic rhinitis: SOB/cough: none. Snoring Hx: none. She went to a graduation yesterday.  +Rhinorrhea x 1 day. She tried robitussin last night.      3.  Multinodular goiter: Her last ultrasound was in April of last year.     06/05/20 Thyroid ultrasound: No significant change in size or imaging characteristics of left thyroid nodules measuring 1.6 x 1.3 x 1.6 cm and 1.3 x 1.2 x 1.1 cm. New 1.1 cm TR 3 Isthmus nodule not requiring FNA or follow-up required per TI-RADS criteria. New subcentimeter left upper spongiform nodule. No FNA or follow-up  required TI-RADS criteria.         Patient Active Problem List    Diagnosis Date Noted    Heart transplant status ([place]) 05/14/2021    Stage 3a chronic kidney disease ([place]) 08/26/2021    Abnormal MRI 04/30/2020    Mild persistent asthma without complication 60/45/4098    History of seizures 08/30/2019    Thyroid nodule 08/30/2019    Anemia 08/30/2019    Depression 08/30/2019    Esophageal dysphagia 03/17/2017    Multinodular goiter 03/04/2016    History of meningioma 03/04/2016    Age-related osteoporosis without current pathological fracture 03/04/2016    Mixed hyperlipidemia     Chronic systolic heart failure ([place])     Non-rheumatic mitral regurgitation 04/25/2014    AR (allergic rhinitis) 09/29/2011    AICD (automatic cardioverter/defibrillator) present 11/15/2010    Cataract 12/01/2008    Gallstones 11/13/2008    OSA (obstructive sleep apnea) 11/13/2008    Fibroid 06/23/2008    HTN (hypertension) 06/23/2008       Current Outpatient Medications   Medication Sig Dispense Refill    benzonatate (TESSALON) 100 MG capsule Take 1 capsule by mouth 3 times daily as needed for Cough 30 capsule 1    acetaminophen (TYLENOL) 325 MG tablet Take by mouth every 4 hours as needed      aspirin 325 MG tablet daily      atorvastatin (LIPITOR) 10 MG tablet daily      brimonidine (ALPHAGAN) 0.2 % ophthalmic solution ceived the following from [facility]: Outside name: brimonidine (ALPHAGAN) 0.2 % ophthalmic solution      dorzolamide (TRUSOPT) 2 % ophthalmic solution ceived the following from [facility]: Outside name: dorzolamide (TRUSOPT) 2 % ophthalmic solution      furosemide (LASIX) 20 MG tablet Take 1 tablet by mouth daily as needed      latanoprost (XALATAN) 0.005 % ophthalmic solution ceived the following from [facility]: Outside name: latanoprost (XALATAN) 0.005 % ophthalmic solution      melatonin 3 MG TABS tablet Take 1 tablet by mouth      meloxicam (MOBIC) 7.5 MG tablet  daily      mycophenolate (CELLCEPT) 500 MG tablet Take 0.5 tablets by mouth 2 times daily      omeprazole (PRILOSEC) 20 MG delayed release capsule ceived the following from [facility]: Outside name: omeprazole (PRILOSEC) 20 mg capsule      prednisoLONE acetate (PRED FORTE) 1 % ophthalmic suspension ceived the following from [facility]: Outside name: prednisoLONE acetate (PRED FORTE) 1 % ophthalmic suspension      tacrolimus (PROGRAF) 1 MG capsule 8 capsules 2 times daily       No current facility-administered medications for this visit.       Allergies  Allergen Reactions    Penicillamine Itching    Penicillins Itching and Rash     Itching bumps on hands and arms  Itching bumps on hands and arms  Itching bumps on hands and arms  Has patient had a PCN reaction causing immediate rash, facial/tongue/throat swelling, SOB or lightheadedness with hypotension: Yes  Has patient had a PCN reaction causing severe rash involving mucus membranes or skin necrosis: No  Has patient had a PCN reaction that required hospitalization unknown  Has patient had a PCN reaction occurring within the last 10 years: No  If all of the above answers are "NO", then may proceed with Cephalosporin use.    Itching bumps on hands and arms    Tramadol Other (See Comments)     Psychotic Hallucinations  Psychotic Hallucinations  hallucinations  Psychotic Hallucinations    Chlorhexidine Itching     CHG Wipes    Copper Itching       Past Medical History:   Diagnosis Date    Age related osteoporosis     Automatic implantable cardiac defibrillator in situ     Post ddd icd Set up [facility]    Baker cyst, left 12/2016    Breast mass, left     benign    CHF (congestive heart failure) ([place]) 06/23/2008    Non-ischemic CMP , Cath (2008) Normal coronary arteries    Chronic systolic heart failure ([facility])     Stable,     Degenerative arthritis of left knee     Fibroid 06/23/2008    Gastric intestinal metaplasia     History of brain  tumor 2013    HLD (hyperlipidemia)     HTN (hypertension) 06/23/2008    Hypotension, unspecified     Related to lisinopril    Hypothyroid 06/23/2008    radioactive iodine ablation of thyroid    Left knee pain     With swelling    Leg pain     LVAD (left ventricular assist device) present ([place]) 03/2017    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     OSA (obstructive sleep apnea) 10/2008    PUD (peptic ulcer disease) 2018    small superficial gastric ulcer    Venous reflux     Wears glasses        Past Surgical History:   Procedure Laterality Date    BREAST BIOPSY      rt breast, benign    CARDIAC DEFIBRILLATOR PLACEMENT      CHOLECYSTECTOMY      CRANIOTOMY  2013    meningioma     CT GUIDED CHEST TUBE  01/13/2019    CT GUIDED CHEST TUBE 01/13/2019    CT NEEDLE BIOPSY LUNG PERCUTANEOUS  03/21/2019    CT NEEDLE BIOPSY LUNG PERCUTANEOUS 03/21/2019    ESOPHAGEAL MOTILITY STUDY  02/16/2017         HEENT      glasses    HYSTERECTOMY (CERVIX STATUS UNKNOWN)      HYSTERECTOMY, TOTAL ABDOMINAL (CERVIX REMOVED)  1996    PACEMAKER  2013    dual chamber icd-removed 2020    PR UNLISTED PROCEDURE CARDIAC SURGERY  11/05/2018    heart transplant    TONSILLECTOMY AND ADENOIDECTOMY      TUBAL LIGATION      XR MIDLINE EQUAL OR GREATER THAN 5 YEARS  02/09/2019    XR MIDLINE EQUAL OR GREATER THAN 5 YEARS 02/09/2019    XR MIDLINE EQUAL  OR GREATER THAN 5 YEARS  04/16/2017    XR MIDLINE EQUAL OR GREATER THAN 5 YEARS 04/16/2017       Family History   Problem Relation Age of Onset    Diabetes Mother     Heart Attack Father         MI    Diabetes Daughter     Other Daughter         leukemia    Heart Disease Maternal Grandmother     Heart Disease Paternal Grandmother     Kidney Disease Other         1 sib deceased    Cancer Other         1 sib throat cancer    Diabetes Other     Hypertension Other     Stomach Cancer Maternal Uncle     Stomach Cancer Maternal Uncle        Social History     Tobacco Use    Smoking status: Never     Smokeless tobacco: Never   Substance Use Topics    Alcohol use: Not Currently     Alcohol/week: 20.0 standard drinks of alcohol       ROS   Review of Systems      Objective     BP 117/69 (Site: Left Upper Arm, Position: Sitting, Cuff Size: Medium Adult)   Pulse 84   Temp 98.2 F (36.8 C) (Temporal)   Resp 18   Ht '5\' 10"'$  (1.778 m)   Wt 145 lb 2 oz (65.8 kg)   SpO2 100%   BMI 20.82 kg/m      Physical Exam      LABS   Data Review:   Lab Results   Component Value Date/Time    WBC 6.1 08/30/2019 01:00 PM    HGBPOC 11.6 01/28/2018 02:24 PM    HGB 8.6 08/30/2019 01:00 PM    HCTPOC 34 01/28/2018 02:24 PM    HCT 30.3 08/30/2019 01:00 PM    PLT 212 08/30/2019 01:00 PM    MCV 88.9 08/30/2019 01:00 PM       Lab Results   Component Value Date/Time    NA 139 08/03/2020 01:00 PM    K 5.1 12/31/2020 07:06 AM    CL 109 08/03/2020 01:00 PM    CO2 24 08/03/2020 01:00 PM    BUN 48 08/03/2020 01:00 PM    GFRAA 26 08/03/2020 01:00 PM       Lab Results   Component Value Date/Time    CHOL 170 08/30/2019 01:00 PM    HDL 64 08/30/2019 01:00 PM       No results found for: "HBA1C", "HBA1CPOC"    Assessment/Plan:   There are no diagnoses linked to this encounter.      No diagnosis found.      Health Maintenance Due   Topic Date Due    DTaP/Tdap/Td vaccine (1 - Tdap) Never done    Shingles vaccine (1 of 2) Never done    COVID-19 Vaccine (3 - Pfizer risk series) 06/09/2019    GFR test (Diabetes, CKD 3-4, OR last GFR 15-59)  08/03/2021    Flu vaccine (1) 09/24/2021    A1C test (Diabetic or Prediabetic)  10/16/2021    Lipids  10/16/2021         Lab review: {lab reviewed:315731}    I have discussed the diagnosis with the patient and the intended plan as seen in the above orders.  The patient  has received an after-visit summary and questions were answered concerning future plans.  I have discussed medication side effects and warnings with the patient as well. I have reviewed the plan of care with the patient, accepted their input and they  are in agreement with the treatment goals. All questions were answered. The patient understands the plan of care. Handouts provided today with above information. ***Pt instructed if symptoms worsen to call the office or report to the ED for continued care.  ***Greater than 50% of the visit time was spent in counseling and/or coordination of care.  ***The patient was counseled on the dangers of tobacco use, and was {MU SMOKING CESSATION COUNSELING:20090::"advised to quit"}.  Reviewed strategies to maximize success, including {techniques:20091}.3-10 minutes were spent on smoking/tobacco cessation    Voice recognition was used to generate this report, which may have resulted in some phonetic based errors in grammar and contents. Even though attempts were made to correct all the mistakes, some may have been missed, and remained in the body of the document.      No follow-up provider specified.    Marius Ditch, MD

## 2022-02-03 NOTE — Other (Signed)
Formatting of this note might be different from the original.  Tacrolimus 8.8  Target 4-8  Creatinine increased to 1.4    Decrease tacrolimus to 6/6  Repeat basic metabolic panel/tacrolimus level 1 week    [facility]  Electronically signed by Minta Balsam, MD at 02/03/2022  3:11 PM EST

## 2022-02-03 NOTE — Progress Notes (Signed)
Formatting of this note is different from the original.  [place]  [facility] Progress Note     Patient:  Cathy [name]   DOB:  02-01-1951    MRN:  [id number]   GENDER:  female   DATE: 02/03/2022, 9:12 AM     Referring Physician: Andria Meuse, MD  Heart Transplant Consultant:  Barron Alvine, MD, F.A.C.C., F.H.F.S.A    Chief Complaint:  Chief Complaint   Patient presents with    [place] Office Visit     History of Present Illness:  Cathy [name] presents today to the Transplant Clinic now 3 years and 3 months status post  orthotopic heart transplant for non-ischemic cardiomyopathy.      She complains of  cough, diarrhea last week with her husband .  She denies chest pain, chills, constipation, decreased appetite, diarrhea, difficulty chewing, difficulty swallowing, dysuria, fatigue, fever, fluid retention, headaches, night sweats, orthopnea, paroxysmal nocturnal dyspnea, presyncope, syncopal episodes, tremors, and vomiting.    IMPRESSION:  S/P Orthotopic Heart Transplantation and HMIII explant - 11/05/2018, CMV D+/R+  Chronic immunosuppression with tacrolimus and mycophenolate, tacrolimus goal at (4-8)   History of induction with thymoglobulin  HCV NAT+ donor, completed HCV treatment with Mavyret  Primary graft dysfunction s/p ECMO 11/05/2018-11/10/2018  History of respiratory failure s/p tracheostomy 11/29/2018, decannulated 04/13/2019  History of chylothorax, resolved  Right cavitary lung mass  Right hemidiaphragm  No recent history of clinically significant rejection, last EMB showed: 1R/1A 10/20/2019  Essential hypertension  Dyslipidemia  COVID 19 PNA 11/13/2019  History of fungal PNA    Plan:  Patient is doing great clinically, she had a bout of diarrhea cough over the weekend which is resolved now.  Continue to monitor closely.  Continue symptomatic management.  Continue immunosuppression with tacrolimus and mycophenolate, tacrolimus  trough goal is 4-8, will check levels, pending level adjust dose accordingly if warranted.  Monitor closely for signs and symptoms of infection or rejection.  Patient had an echocardiogram done in October of this year which I personally reviewed and noted normal LV systolic function with estimated ejection fraction of 55%.  Blood pressure is at goal, continue to monitor closely.  Keep blood pressure log and bring it to clinic  Continue atorvastatin for dyslipidemia, I have personally reviewed and interpreted her last lipid panel from September of this year and noted normal LDL levels.  Continue current dose.  Medications were reconciled today.  Healthy lifestyle has been encouraged including avoidance of tobacco, heart healthy diet, regular exercise and maintaining an optimal BMI.   She will return to the clinic in 3 months.  She has our telephone number and has been instructed to call for any questions or problems.     History:  Pre-transplant history was complicated by history [place] and an EF 15% in 2018, originally diagnosed in 2008 and followed by [facility].  Milrinone added in 2019 and she underwent evaluation for MCS/OHT 03/2017. She had HM 3 LVAD implantation and TV repair with 105m MC3 annuloplasty band on 04/06/17. Pericardial effusion was noted and she returned to OR for mediastinal re-exploration and washout with removal of anterior mediastinal hematoma, repair of right atriotomy suture line and sternal closure. She continued to progress and was  discharged to Inpatient Rehab on 04/22/2017. She was discharged from rehab on 04/30/2017     She was taken to the OR 11/05/18 by Drs. UShirleen Schirmerand Phillpott. Surgical course complicated by primary  graft dysfunction and severe intraoperative bleeding.      After orthotopic heart transplant she was noted to have primary graft dysfunction requiring central ECMO.  Poor biventricular function was noted coming off of the pump and patient had severe intraoperative bleeding  requiring factor VII blood transfusions and blood products.  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  Her LVAD and her AICD was explanted.  She remains on ventilator with active resuscitation going on with blood products and vasopressors.  Due to concern for distended left ventricle and central ECMO decision was also made to insert an intra-aortic balloon pump to unload the left ventricle.  Her chest was left open.    Induction with thymoglobulin and Bedside IABP placed 9/12.      She had a tumultuous post op course complicated by delayed chest closure and multiple atrial arrhythmias with multiple DCCVs.    Her IABP removed 9/22. She suffered polymorphic VT arrest requiring defibrillation for 2 episodes following removal.    On 9/26 she suffered cardiac arrest with polymorphic ventricular tachycardia/ventricular fibrillation in setting of prolonged QT requiring emergent defibrillation - amiodarone discontinued.EP consulted and she required     While intubated in ICU, she required FMS with eventual Trach and PEG placement.     Nephrology consulted for AKI in setting of cardiogenic shock/bleeding.     She required CRRT 9/12-10/13/20 transitioned to conventional HD until 02/08/19. Diuretics adjusted   She was taken back to OR I&D of sternotomy wound with application of wound vac, CT sites debrided as well.  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     Thoracentesis performed 10/26 for chylous effusion -44m milky fluid removed. She returned to IR 11/2 for R sided CT placement for chylous leak- imaging c/w R sided empyema, culture from broch unrevealing.     Neurology consulted and Keppra added for witnessed seizure activity- (tonic-clonic, broke with ativan). EEG/CT without acute findings    [facility] returning to ICU 01/12/19 for hypercapneic respiratory failure requiring re  initiation of mechanical ventilation. 01/13/19 L sided CT placed for pleural effusion. RUL cavitary PNA treated with antibiotics. Prolonged lethargy - mechanical vent weaned slowly to trach collar. Eventual transition back AHFU.   There was an attempt to embolize the thoracic duct for chylous leak, however this was aborted as lymphatic contrast did not traverse to the lower thoracic duct.     Neurology consulted and Keppra added for witnessed seizure activity- (tonic-clonic, broke with ativan). EEG/CT without acute findings.      RUL cavitary lesion extensively imaged with prolonged course of IV antibiotics. She had an IR guide biopsy on 03/21/19 negative for malignancy noting necrotic debris with rare fibroblasts. Acid fast negative and Gm stain negative for fungus. quantiferon gold x 2 indeterminate. prolonged trach as unable to de cannulate due to profound weakness and episodes of apathy.   11/29/18 initial trach by ENT- Tracoe 8 DCT  01/04/19- exchanged 8 Tracoe DCT  01/06/19 - exchnaged 8 uncuffed Tracoe  01/11/19- exchanged -cuffless Tracoe 7 by ENT  04/06/19 exchanged/downsized shiley 4 cuffless  04/07/19- tolerating PMV- passed MBS for pureed and thin  Decannulated 04/24/19- intolerant of CPAP use     Prolonged episodes of patient being non verbal, severe depression prolonged stay and limited her improvement. Psychiatry consulted for depression treatment. Eventually placed on mix of antidepressives and ritalin with discontinuation  of sedatives, narcotics and mexilitine for desired effects on mood and activity.     She eventually began to routinely work with PT/OT/ST now able to walk short distances.   PEG tube became dislodged and was replaced as she was still dependent on TF for nutrition. As swallow improved diet has been advanced and now she is tolerating mechanical chopped with thin liquids.     After discontinuation of HD renal function recovered. She has required minimal oral diuretics.     Prednisone has  been weaned prior to transfer out of ICU for critical host protocol.  MMF discontinued 11/13/18, steroids discontinued 11/22/18.     Valcyte CMV prophylaxis discontinued 04/05/19 for leukopenia     Mavyret completed for HCV+donor with seroconversion while hospitalized - all HCV pcr remain negative since treatment.     COVID vaccines completed during her stay.     At the time of transfer to inpatient rehabilitation, she was afebrile, ambulating with assistance and eating, drinking and voiding without difficulty.  Her PEG remained in place but was not in use.       Hospitalized 10/10/19-10/12/19, Presented to [facility] ER for eval of SOB and episode of CP. Was found to O2 sat in the 70s. Pt underwent CP work up in the ER (Labs, CXR, EKG) and was found to have trop of 90 with repeat 43. Decision made to admit and have pt annual exam moved up a few weeks. CP resolved prior to pt coming to [facility]. Her work up was negative. Her tacrolimus was increased.     Patient was hospitalized 11/13/2019 for COVID 19 pneumonia.  Hospitalized 06/12/20-4/2 for cavitary fungal PNA with concerns for aspiration.. She is  s/p bronchoscopy on 4/22- cultures + candida albicans   She was treated with IV ceftriaxone and flagyl while hospitalized now completing moxifloxacin and ongoing fluconazone. Tacrolimus dose was reduced to '5mg'$  bid (previous dose was '7mg'$  bid)     Cardiac medications were reconciled  Atorvastatin 10 mg nightly  Furosemide 20 mg as needed- took one dose last week for abdominal bloating   Tacrolimus 5 mg every 12 hours    IMAGING:  I have reviewed and updated the following list of invasive and noninvasive studies and discussed results of any new data with the patient.    LHC 10/16/2020:  Normal Hemodynamics / Output  Normal Coronaries  ISHLT CAV 0     TTE 10/16/2020:  CONCLUSIONS    * Left ventricular chamber size, wall thickness, and systolic function are  normal with no regional wall motion abnormalities with an estimated ejection  fraction  of 60 %.    * Left ventricular diastolic function: indeterminate.    * Right ventricular chamber size is normal with normal systolic function,  TAPSE 1.7 cm and TAPSV 11.50 cm/s.    * There is mild mitral valve regurgitation.    * Estimated pulmonary arterial systolic pressure is 30 mmHg.    REVIEW OF SYSTEMS as below: Reviewed 02/03/2022    General: She denies fevers  HEENT: She denies eye pain  Pulmonary: She reports cough over the weekend  Cardiac: She denies palpitations  GI: She reports diarrhea over the weekend  GU: She denies urinary urgency or frequency.  Endocrine: She denies heat or cold intolerance.  Dermatologic: She denies skin rash  Musculoskeletal: She denies muscle pain  Neuro: She denies seizures  Psychiatric: She denies major depression    Past Medical History:  Past Medical History:   Diagnosis Date  Acute respiratory failure with hypoxia (HCC) 72/53/6644    Acute systolic congestive heart failure ([place]) 06/29/2016    AKI (acute kidney injury) ([place]) 11/11/2018    Requiring CRRT, then HD    Atrial tachycardia ([place])     Cardiogenic shock ([place]) 11/11/2018    Cardiomyopathy ([place])     CHF (congestive heart failure), NYHA class IV, acute on chronic, diastolic ([place]) 03/47/4259    Chronic systolic congestive heart failure ([place]) 06/29/2016    Congestive heart failure (CHF) ([place])     Constipation     Coronary artery disease     COVID-19 11/13/2019    per pt- mild but hospitalized due to hx of heart transplant    Encephalopathy 01/03/2019    Exercise tolerance finding 09/28/2020    denies cp/ sob with exertion    Fall 06/20/2017    High cholesterol     History of tracheostomy 2020    11/29/2018-04/13/2019    HTN (hypertension)     resolved    Hypothyroid     resolved    ICD (implantable cardioverter-defibrillator) in place     explanted 11/05/18    Injury of left radial artery     LVAD (left ventricular assist device) present [facility])     Mitral regurgitation     Nonischemic cardiomyopathy (HCC)      Overproduction of thyroid-stimulating hormone (TSH) 08/17/2006    Personal history of ECMO 01/03/2019    [facility] 9[facility]16    Pneumonia due to infectious organism     s/p orthotopic heart transplant 12/17/2018 11/05/2018    Seizure ([place])     x1-  around time of craniotomy    Sleep apnea     no cpap currently    Torsades de pointes ([place])      Past Surgical History:  Past Surgical History:   Procedure Laterality Date    BRONCHOSCOPY N/A 06/15/2020    Procedure: BRONCHOSCOPY;  Surgeon: Ezequiel Essex, MD    CARDIAC ASSIST VENTRICULAR DEVICE/ VAD IMPLANTABLE INSERTION N/A 04/06/2017    Procedure: INSERTION, LVAD, INTRACORPOREAL;  Surgeon: Mardene Sayer, MD    CARDIAC SURGERY N/A 04/06/2017    Procedure: REPAIR, TRICUSPID VALVE;  Surgeon: Mardene Sayer, MD    CARDIAC SURGERY N/A 04/13/2017    Procedure: [place], IN IMMEDIATE OPEN HEART SURGERY POSTOPERATIVE PERIOD;  Surgeon: Donney Dice, MD    CARDIAC SURGERY N/A 11/06/2018    Procedure: Ria Bush, IN IMMEDIATE OPEN HEART SURGERY POSTOPERATIVE PERIOD;  Surgeon: Arbie Cookey, MD    CARDIAC SURGERY N/A 11/06/2018    Procedure: Ria Bush, IN IMMEDIATE OPEN HEART SURGERY POSTOPERATIVE PERIOD;  Surgeon: Arbie Cookey, MD    CARDIAC SURGERY N/A 11/10/2018    Procedure: Ria Bush, IN IMMEDIATE OPEN HEART SURGERY POSTOPERATIVE PERIOD;  Surgeon: Earlean Polka, MD    CARDIOVERTER-DEFIBRILLATOR/ ICD LEAD/S REPLACEMENT Left 10/07/2018    Gen change by Dr Ignacia Marvel    COLONOSCOPY N/A 07/16/2017    Procedure: COLONOSCOPY;  Surgeon: Golda Acre, MD    CRANIOTOMY      benign tumor excised    EGD WITH PEG TUBE INSERTION N/A 12/02/2018    Procedure: EGD, WITH PERCUTANEOUS GASTROSTOMY TUBE INSERTION;  Surgeon: Blima Singer, MD    EXCISION NEUROFIBROMA/ [place] Left 10/01/2020    Procedure: EXCISION, NEUROFIBROMA;  Surgeon: Margo Common, MD    HEART TRANSPLANT N/A 11/05/2018    Procedure:  TRANSPLANT, HEART;  Surgeon: Earlean Polka, MD  HYSTERECTOMY      INCISION & DRAINAGE COMPLEX POSTOPERATIVE WOUND INFECTION N/A 12/17/2018    Procedure: INCISION AND DRAINAGE COMPLEX POSTOPERATIVE WOUND INFECTION TRUNK;  Surgeon: Earlean Polka, MD    STERNUM CLOSURE N/A 11/14/2018    Procedure: CLOSURE, MEDIAN STERNOTOMY, WITH STERNAL DEBRIDEMENT;  Surgeon: Donney Dice, MD    TRACHEOSTOMY N/A 11/29/2018    Procedure: TRACHEOSTOMY;  Surgeon: Leeanne Deed, MD    TRANSESOPHAGEAL ECHOCARDIOGRAPHY/ TEE N/A 04/06/2017    Procedure: ECHOCARDIOGRAM, TRANSESOPHAGEAL;  Surgeon: Mardene Sayer, MD    TRANSESOPHAGEAL ECHOCARDIOGRAPHY/ TEE N/A 11/06/2018    Procedure: ECHOCARDIOGRAM, TRANSESOPHAGEAL;  Surgeon: Arbie Cookey, MD    TRANSESOPHAGEAL ECHOCARDIOGRAPHY/ TEE N/A 11/06/2018    Procedure: ECHOCARDIOGRAM, TRANSESOPHAGEAL;  Surgeon: Arbie Cookey, MD    TRANSESOPHAGEAL ECHOCARDIOGRAPHY/ TEE N/A 11/05/2018    Procedure: ECHOCARDIOGRAM, TRANSESOPHAGEAL;  Surgeon: Earlean Polka, MD    TRANSESOPHAGEAL ECHOCARDIOGRAPHY/ TEE N/A 11/10/2018    Procedure: ECHOCARDIOGRAM, TRANSESOPHAGEAL;  Surgeon: Earlean Polka, MD    TRANSESOPHAGEAL ECHOCARDIOGRAPHY/ TEE N/A 11/14/2018    Procedure: ECHOCARDIOGRAM, TRANSESOPHAGEAL;  Surgeon: Donney Dice, MD    VENOUS ACCESS CATHETER INSERTION Left 11/29/2018    Procedure: INSERTION, CATHETER, TUNNELED, FOR DIALYSIS;  Surgeon: Ned Grace, MD    VENOUS ACCESS CATHETER INSERTION N/A 01/07/2019    Procedure: TCVL placement;  Surgeon: Jeoffrey Massed, MD    VENOUS ACCESS CATHETER INSERTION Left 02/07/2019    Procedure: Tunnel Dialysis Catheter Insertion;  Surgeon: Ned Grace, MD     Family History:  Family History   Problem Relation Age of Onset    Cancer Brother         throat    Diabetes Mother     Heart Failure Mother     Heart Failure Father     Diabetes Father     Diabetes Sister        Social History:  Social History     Socioeconomic  History    Marital status: Married     Spouse name: Not on file    Number of children: Not on file    Years of education: Not on file    Highest education level: Not on file   Occupational History    Not on file   Tobacco Use    Smoking status: Former     Types: Cigarettes    Smokeless tobacco: Never    Tobacco comments:     quit at age 69   Vaping Use    Vaping Use: Never used   Substance and Sexual Activity    Alcohol use: No    Drug use: No    Sexual activity: Not on file   Other Topics Concern    Not on file   Social History Narrative    Not on file     Social Determinants of Health     Financial Resource Strain: Not on file   Food Insecurity: Not on file   Transportation Needs: Not on file   Physical Activity: Not on file   Stress: Not on file   Social Connections: Not on file   Intimate Partner Violence: Not on file   Housing Stability: Not on file     Allergies:  Allergies   Allergen Reactions    Tramadol unknown and neurological reaction     Psychotic Hallucinations    Chlorhexidine rash/itching    Chlorhexidine [place] rash/itching     CHG Wipes     Copper rash/itching  Copper sheets/ gown    Penicillamine rash/itching    Nsaids (Non-Steroidal Anti-Inflammatory Drug) other/intolerance    Penicillin rash/itching    Penicillins rash/itching     Itching bumps on hands and arms     Physical Examination:    BP 133/60   Pulse 83   Wt 66.5 kg (146 lb 9.6 oz)   SpO2 100%   BMI 21.65 kg/m     Wt Readings from Last 3 Encounters:   02/03/22 66.5 kg (146 lb 9.6 oz)   11/04/21 67.1 kg (148 lb)   09/03/21 67.4 kg (148 lb 8 oz)     CONSTITUTIONAL: well developed, nourished, no distress, alert and oriented x 3, and husband present during encounter  CARDIOVASCULAR: regular rate and rhythm, S1, S2 normal, no murmur, click, rub or gallop  PULMONARY/CHEST WALL: breath sounds normal and effort normal  ABDOMINAL: appearance normal and soft  NEUROLOGICAL: awake, alert and oriented x 3   HENT: atraumatic, nose normal and  normocephalic  EYES: conjunctiva normal and EOM normal  NECK: ROM normal, supple and trachea normal  GENITOURINARY:  deferred  MUSCULOSKELETAL: normal ROM  PSYCH: No signs of mood disorder.  EXTREMITIES: No lower extremity edema    Laboratory Data:  Lab Results   Component Value Date    HEMOGLOBIN 10.5 (L) 11/01/2021    HCT 33.6 (L) 11/01/2021    WBC 2.7 (L) 11/01/2021    PLATELET 180 11/01/2021     Lab Results   Component Value Date    NA 141 11/01/2021    POTASSIUM 5.3 11/01/2021    CHLORIDE 108 11/01/2021    CO2 25 11/01/2021    BUN 26 (H) 11/01/2021    CREAT 1.1 11/01/2021    GLUCOSE 98 11/01/2021     Lab Results   Component Value Date    ALBUMIN 4.5 11/01/2021    TOTPR 6.8 11/01/2021    SGOTAST 12 11/01/2021    SGPTALT 14 11/01/2021    ALKPHOS 115 11/01/2021    AGRAT 2.0 11/01/2021    BILIT 0.3 11/01/2021    BILID <0.2 11/01/2021    GLOBULIN 2.3 11/01/2021     Lab Results   Component Value Date    MAGNESIUM 1.9 11/01/2021    CALCIUM 9.2 11/01/2021     Lab Results   Component Value Date    PROGRAF 6.6 11/01/2021    PROGRAF 6.4 09/02/2021    PROGRAF 8.4 06/03/2021     Lab Results   Component Value Date    CPK 43 11/01/2021     Lab Results   Component Value Date/Time    CHOLESTEROL 158 11/01/2021 10:27 AM    HDL 67 11/01/2021 10:27 AM    LDLIPO 77 11/01/2021 10:27 AM    TRIGLYCERIDE 66 11/01/2021 10:27 AM     Medications:  Current Outpatient Medications   Medication Sig Dispense Refill    atorvastatin (LIPITOR) 10 mg PO TABS Take 1 Tab by Mouth Every Night at Bedtime. 90 Tab 3    brimonidine (ALPHAGAN) 0.2 % OP Drop Instill 1 Drop in both eyes Every 8 Hours.      cholecalciferol (VITAMIN D3) 25 mcg (1,000 unit) PO TABS Take 1 Tab by Mouth Once a Day.      dorzolamide (TRUSOPT) 2 % OP Drop Instill 1 Drop in both eyes 3 Times Daily.      furosemide (LASIX) 20 mg PO TABS Take 1 Tab by Mouth Take As Needed. Take one tab by mouth daily as  needed for leg swelling or weight gain. 15 Tab 3    latanoprost (XALATAN) 0.005 %  OP Drop Instill 1 Drop in both eyes Every Night at Bedtime.      mycophenolate (CELLCEPT) 250 mg PO CAPS Take 1 Cap by Mouth Twice Daily. 180 Cap 3    omeprazole (PRILOSEC) 20 mg PO CPDR Take 1 Cap by Mouth Every Morning Before Breakfast. 90 Cap 3    tacrolimus (PROGRAF) 1 mg PO CAPS Take 3 Caps by Mouth Every 12 hours. Total 8 mg twice daily (take with 5 mg tab) 180 Cap 11    tacrolimus (PROGRAF) 5 mg PO CAPS Take 1 Cap by Mouth Every 12 hours. Total 8 mg twice daily (take with three 1 mg tabs) 60 Cap 11     No current facility-administered medications for this encounter.     Facility-Administered Medications Ordered in Other Encounters   Medication Dose Route Frequency Provider Last Rate Last Admin    lidocaine (ELA-MAX) 4 % cream 1 Application  1 Application  Topical On Call Herre, Latricia Heft, MD         The Dragon dictation tool was used in preparation of this note, so please excuse typos/grammatical errors.  If you have questions regarding details of the note please call my office at [phone] and make an appointment to discuss your concerns.    Barron Alvine, MD MS [facility] F[facility]  Advanced Heart Failure, MCS, and Heart Transplant Cardiologist  Pager: [phone], dial [phone], then call back number  Electronically signed by Barron Alvine, MD at 02/03/2022  9:20 AM EST

## 2022-02-03 NOTE — Progress Notes (Signed)
Formatting of this note is different from the original.  Images from the original note were not included.        Clinic Visit Nursing Assessment    02/03/2022  Cathy [name]  11/05/2018 (Heart)    Reason for visit : Routine              Transplant Symptoms:     Patient complains of  No cardiac complaints. Pt with cough and diarrhea x3 days (Fri- Sun).  No symptoms today    Patient denies SOB, DOE, PND, LE Edema, Chest pain, Loss of Appetite, Diarrhea, Nausea, Vomiting, Constipation, Night sweats, Chills, and Fever        BP 133/60   Pulse 83   Wt 66.5 kg (146 lb 9.6 oz)   SpO2 100%   BMI 21.65 kg/m   Wt Readings from Last 4 Encounters:   02/03/22 66.5 kg (146 lb 9.6 oz)   11/04/21 67.1 kg (148 lb)   09/03/21 67.4 kg (148 lb 8 oz)   08/25/21 67 kg (147 lb 11.3 oz)     Home Medication List - Marked as Reviewed on 11/04/21 0951   Medication Sig   atorvastatin (LIPITOR) 10 mg PO TABS Take 1 Tab by Mouth Every Night at Bedtime.   brimonidine (ALPHAGAN) 0.2 % OP Drop Instill 1 Drop in both eyes Every 8 Hours.   cholecalciferol (VITAMIN D3) 25 mcg (1,000 unit) PO TABS Take 1 Tab by Mouth Once a Day.   dorzolamide (TRUSOPT) 2 % OP Drop Instill 1 Drop in both eyes 3 Times Daily.   furosemide (LASIX) 20 mg PO TABS Take 1 Tab by Mouth Take As Needed. Take one tab by mouth daily as needed for leg swelling or weight gain.   latanoprost (XALATAN) 0.005 % OP Drop Instill 1 Drop in both eyes Every Night at Bedtime.   mycophenolate (CELLCEPT) 250 mg PO CAPS Take 1 Cap by Mouth Twice Daily.   omeprazole (PRILOSEC) 20 mg PO CPDR Take 1 Cap by Mouth Every Morning Before Breakfast.   tacrolimus (PROGRAF) 1 mg PO CAPS Take 3 Caps by Mouth Every 12 hours. Total 8 mg twice daily (take with 5 mg tab)   tacrolimus (PROGRAF) 5 mg PO CAPS Take 1 Cap by Mouth Every 12 hours. Total 8 mg twice daily (take with three 1 mg tabs)     Laqueta Jean, BSN, RN  Heart Transplant/VAD Coordinator  Advanced Heart Failure Program  [phone]      Electronically signed by Wess Botts, RN at 02/03/2022  8:55 AM EST

## 2022-02-03 NOTE — Progress Notes (Signed)
Formatting of this note is different from the original.  Lab Only Encounter    02/03/2022, 12:16 PM    Cathy [name]  11/05/2018 (Heart)    Organ: Heart  Phase: Transplanted  Status: Active Follow-up   Effective: 11/05/2018    Reason for labs: Routine    Active Patient Thresholds       Lab Low High Effective Since Comment    Tacrolimus Level 4 8 01/06/2020 per dr herre 01/06/20         Lab Results   Component Value Date    PROGRAF 8.8 02/03/2022     Basic Metabolic Profile   Lab Results   Component Value Date    NA 141 02/03/2022    POTASSIUM 4.8 02/03/2022    CHLORIDE 106 02/03/2022    CO2 25 02/03/2022    BUN 28 (H) 02/03/2022    CREAT 1.4 02/03/2022    GLUCOSE 101 (H) 02/03/2022    CALCIUM 9.0 02/03/2022    MAGNESIUM 1.9 11/01/2021    PHOSPHORUS 3.8 06/20/2020       CBC w/Diff  Lab Results   Component Value Date/Time    WBC 1.9 (L) 02/03/2022 08:29 AM    RBC 3.53 (L) 02/03/2022 08:29 AM    HEMOGLOBIN 10.3 (L) 02/03/2022 08:29 AM    HCT 33.8 (L) 02/03/2022 08:29 AM    MCV 96 02/03/2022 08:29 AM    MCH 29 02/03/2022 08:29 AM    MCHC 31 02/03/2022 08:29 AM    RDW 13.2 02/03/2022 08:29 AM    PLATELET 151 02/03/2022 08:29 AM    MPV 11.1 02/03/2022 08:29 AM    SEGS 45 02/03/2022 08:29 AM    LYMPHOCYTES 39 02/03/2022 08:29 AM    MONOS 9 02/03/2022 08:29 AM    EOS 2 02/03/2022 08:29 AM    BASOS 1 02/03/2022 08:29 AM     02/03/2022, 3:53 PM  Reviewed all above labs with Dr.Yehya  DECREASE tacrolimus to '7mg'$  BID  Called and reviewed with pt    Laqueta Jean, BSN, RN  Heart Transplant/VAD Coordinator  [facility]  [phone]      Electronically signed by Wess Botts, RN at 02/03/2022  3:55 PM EST

## 2022-02-03 NOTE — Other (Signed)
Formatting of this note might be different from the original.  WBC decreased to 1.9  Creatinine increased to 1.4    Await tacrolimus level  Repeat basic metabolic panel, CBC 1 week    [facility]  Electronically signed by Minta Balsam, MD at 02/03/2022  2:56 PM EST

## 2022-02-14 NOTE — Other (Signed)
Formatting of this note might be different from the original.  Basic metabolic panel normal    No change    [facility]  Electronically signed by Minta Balsam, MD at 02/14/2022 10:21 AM EST

## 2022-04-09 ENCOUNTER — Telehealth: Admit: 2022-04-09 | Discharge: 2022-04-09 | Payer: MEDICARE | Attending: Family Medicine | Primary: Family Medicine

## 2022-04-09 DIAGNOSIS — J209 Acute bronchitis, unspecified: Secondary | ICD-10-CM

## 2022-04-09 MED ORDER — AZITHROMYCIN 250 MG PO TABS
250 | ORAL_TABLET | ORAL | 0 refills | Status: AC
Start: 2022-04-09 — End: 2022-04-14

## 2022-04-09 NOTE — Telephone Encounter (Signed)
VV added today for 3:20

## 2022-04-09 NOTE — Progress Notes (Unsigned)
Cathy [name] is a 72 y.o. female who was seen by synchronous (real-time) audio-video*** technology on 04/09/2022.        Assessment & Plan:   There are no diagnoses linked to this encounter.      {No diagnosis found. (Refresh or delete this SmartLink)}        Lab review: labs are reviewed in the EHR***     I have discussed the diagnosis with the patient and the intended plan as seen in the above orders. I have discussed medication side effects and warnings with the patient as well. I have reviewed the plan of care with the patient, accepted their input and they are in agreement with the treatment goals. All questions were answered. The patient understands the plan of care. Pt instructed if symptoms worsen to call the office or report to the ED for continued care.  Greater than 50% of the visit time was spent in counseling and/or coordination of care.***     Voice recognition was used to generate this report, which may have resulted in some phonetic based errors in grammar and contents. Even though attempts were made to correct all the mistakes, some may have been missed, and remained in the body of the document.      Subjective:   Cathy [name] was seen for   Chief Complaint   Patient presents with    Nasal Congestion    Cough     Coughing up green mucus since Sunday         1. ***:  She began coughing and sneezing on Sunday. She began to take robitussin every 4 hours. Her mucous is green. It "kept coming." No sick contacts. No fever. +Chills. Her throat was sore on Sunday. Her sx are improving. +Congestion. +Pleuritic CP. "My breathing is fine." No wheezing.      Prior to Admission medications    Medication Sig Start Date End Date Taking? Authorizing Provider   benzonatate (TESSALON) 100 MG capsule Take 1 capsule by mouth 3 times daily as needed for Cough 08/22/21  Yes Darnelle Going, MD   atorvastatin (LIPITOR) 10 MG tablet daily 10/20/20  Yes Automatic Reconciliation, Ar   brimonidine (ALPHAGAN) 0.2 %  ophthalmic solution ceived the following from [facility]: Outside name: brimonidine (ALPHAGAN) 0.2 % ophthalmic solution 05/08/20  Yes Automatic Reconciliation, Ar   dorzolamide (TRUSOPT) 2 % ophthalmic solution ceived the following from [facility]: Outside name: dorzolamide (TRUSOPT) 2 % ophthalmic solution 04/02/20  Yes Automatic Reconciliation, Ar   furosemide (LASIX) 20 MG tablet Take 1 tablet by mouth daily as needed 09/26/20  Yes Automatic Reconciliation, Ar   melatonin 3 MG TABS tablet Take 1 tablet by mouth 06/15/19  Yes Automatic Reconciliation, Ar   mycophenolate (CELLCEPT) 500 MG tablet Take 0.5 tablets by mouth 2 times daily   Yes Automatic Reconciliation, Ar   omeprazole (PRILOSEC) 20 MG delayed release capsule ceived the following from [facility]: Outside name: omeprazole (PRILOSEC) 20 mg capsule 06/20/20  Yes Automatic Reconciliation, Ar   prednisoLONE acetate (PRED FORTE) 1 % ophthalmic suspension ceived the following from [facility]: Outside name: prednisoLONE acetate (PRED FORTE) 1 % ophthalmic suspension 04/30/20  Yes Automatic Reconciliation, Ar   tacrolimus (PROGRAF) 1 MG capsule 8 capsules 2 times daily 08/15/19  Yes Automatic Reconciliation, Ar   acetaminophen (TYLENOL) 325 MG tablet Take by mouth every 4 hours as needed  Patient not taking:  Reported on 04/09/2022    Automatic Reconciliation, Ar   aspirin 325 MG tablet daily  Patient not taking: Reported on 04/09/2022 10/01/20   Automatic Reconciliation, Ar   latanoprost (XALATAN) 0.005 % ophthalmic solution ceived the following from [facility]: Outside name: latanoprost (XALATAN) 0.005 % ophthalmic solution  Patient not taking: Reported on 04/09/2022 05/08/20   Automatic Reconciliation, Ar   meloxicam (MOBIC) 7.5 MG tablet daily  Patient not taking: Reported on 04/09/2022 10/01/20   Automatic Reconciliation, Ar     Allergies   Allergen Reactions    Penicillamine Itching     Penicillins Itching and Rash     Itching bumps on hands and arms  Itching bumps on hands and arms  Itching bumps on hands and arms  Has patient had a PCN reaction causing immediate rash, facial/tongue/throat swelling, SOB or lightheadedness with hypotension: Yes  Has patient had a PCN reaction causing severe rash involving mucus membranes or skin necrosis: No  Has patient had a PCN reaction that required hospitalization unknown  Has patient had a PCN reaction occurring within the last 10 years: No  If all of the above answers are "NO", then may proceed with Cephalosporin use.    Itching bumps on hands and arms    Tramadol Other (See Comments)     Psychotic Hallucinations  Psychotic Hallucinations  hallucinations  Psychotic Hallucinations    Chlorhexidine Itching     CHG Wipes    Copper Itching     Past Medical History:   Diagnosis Date    Age related osteoporosis     Automatic implantable cardiac defibrillator in situ     Post ddd icd Set up carelink    Baker cyst, left 12/2016    Breast mass, left     benign    CHF (congestive heart failure) (HCC) 06/23/2008    Non-ischemic CMP , Cath (2008) Normal coronary arteries    Chronic systolic heart failure (HCC)     Stable,     Degenerative arthritis of left knee     Fibroid 06/23/2008    Gastric intestinal metaplasia     History of brain tumor 2013    HLD (hyperlipidemia)     HTN (hypertension) 06/23/2008    Hypotension, unspecified     Related to lisinopril    Hypothyroid 06/23/2008    radioactive iodine ablation of thyroid    Left knee pain     With swelling    Leg pain     LVAD (left ventricular assist device) present (HCC) 03/2017    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     OSA (obstructive sleep apnea) 10/2008    PUD (peptic ulcer disease) 2018    small superficial gastric ulcer    Venous reflux     Wears glasses      Past Surgical History:   Procedure Laterality Date    BREAST BIOPSY      rt breast, benign    CARDIAC DEFIBRILLATOR PLACEMENT       CHOLECYSTECTOMY      CRANIOTOMY  2013    meningioma     CT GUIDED CHEST TUBE  01/13/2019    CT GUIDED CHEST TUBE 01/13/2019    CT NEEDLE BIOPSY LUNG PERCUTANEOUS  03/21/2019    CT NEEDLE BIOPSY LUNG PERCUTANEOUS 03/21/2019    ESOPHAGEAL MOTILITY STUDY  02/16/2017         HEENT      glasses  HYSTERECTOMY (CERVIX STATUS UNKNOWN)      HYSTERECTOMY, TOTAL ABDOMINAL (CERVIX REMOVED)  1996    PACEMAKER  2013    dual chamber icd-removed 2020    PR UNLISTED PROCEDURE CARDIAC SURGERY  11/05/2018    heart transplant    TONSILLECTOMY AND ADENOIDECTOMY      TUBAL LIGATION      XR MIDLINE EQUAL OR GREATER THAN 5 YEARS  02/09/2019    XR MIDLINE EQUAL OR GREATER THAN 5 YEARS 02/09/2019    XR MIDLINE EQUAL OR GREATER THAN 5 YEARS  04/16/2017    XR MIDLINE EQUAL OR GREATER THAN 5 YEARS 04/16/2017     Family History   Problem Relation Age of Onset    Diabetes Mother     Heart Attack Father         MI    Diabetes Daughter     Other Daughter         leukemia    Heart Disease Maternal Grandmother     Heart Disease Paternal Grandmother     Kidney Disease Other         1 sib deceased    Cancer Other         1 sib throat cancer    Diabetes Other     Hypertension Other     Stomach Cancer Maternal Uncle     Stomach Cancer Maternal Uncle      Social History     Socioeconomic History    Marital status: Married   Tobacco Use    Smoking status: Never    Smokeless tobacco: Never   Vaping Use    Vaping Use: Never used   Substance and Sexual Activity    Alcohol use: Not Currently     Alcohol/week: 20.0 standard drinks of alcohol    Drug use: Never    Sexual activity: Yes     Partners: Male     Birth control/protection: None     Comment: married     Social Determinants of Health     Financial Resource Strain: Low Risk  (08/01/2021)    Overall Financial Resource Strain (CARDIA)     Difficulty of Paying Living Expenses: Not hard at all   Transportation Needs: Unknown (08/01/2021)    PRAPARE - Transportation     Lack of Transportation (Non-Medical): No    Physical Activity: Insufficiently Active (08/01/2021)    Exercise Vital Sign     Days of Exercise per Week: 1 day     Minutes of Exercise per Session: 10 min   Housing Stability: Unknown (08/01/2021)    Housing Stability Vital Sign     Unstable Housing in the Last Year: No       ROS:  Gen: No fever/chills  HEENT: No sore throat, eye pain, ear pain, or congestion. No HA  CV: No CP  Resp: No cough/SOB  GI: No abdominal pain  GU: No hematuria/dysuria  Derm: No rash  Neuro: No new paresthesias/weakness  Musc: No new myalgias/jt pain  Psych: No depression sx      Objective:     General: {gen appear:16600::"alert","cooperative","no distress"}   Mental  status: {mental status:313008::"alert, oriented to person, place, and time","normal mood, behavior, speech, dress, motor activity, and thought processes":1}   Resp: Nonlabored breathing***   Neuro: No focal neurologic deficits (new)***   Skin: {skin:315960::"no discoloration or lesions of concern on visible areas":1}     LABS:  Lab Results   Component Value Date/Time    NA  139 08/03/2020 01:00 PM    K 5.1 12/31/2020 07:06 AM    CL 109 08/03/2020 01:00 PM    CO2 24 08/03/2020 01:00 PM    BUN 48 08/03/2020 01:00 PM    GFRAA 26 08/03/2020 01:00 PM       Lab Results   Component Value Date/Time    CHOL 170 08/30/2019 01:00 PM    HDL 64 08/30/2019 01:00 PM       Lab Results   Component Value Date/Time    WBC 6.1 08/30/2019 01:00 PM    HGBPOC 11.6 01/28/2018 02:24 PM    HGB 8.6 08/30/2019 01:00 PM    HCTPOC 34 01/28/2018 02:24 PM    HCT 30.3 08/30/2019 01:00 PM    PLT 212 08/30/2019 01:00 PM    MCV 88.9 08/30/2019 01:00 PM       No results found for: "HBA1C", "HBA1CPOC"    Lab Results   Component Value Date/Time    TSH 2.54 08/30/2019 01:00 PM           Due to this being a TeleHealth *** evaluation, many elements of the physical examination are unable to be assessed.     The pt was seen by synchronous (real-time) audio-video*** technology, and/or her healthcare decision maker, is  aware that this patient-initiated, Telehealth encounter is a billable service, with coverage as determined by her insurance carrier. She is aware that she may receive a bill and has provided verbal consent to proceed: Yes. The patient (or guardian if applicable) is aware that this is a billable service, which includes applicable copays. This virtual visit was conducted with the patient's (and/or legal guardian's) consent. The pt is located in a state where the provider was licensed to provide care.     The pt is being evaluated by a video*** visit encounter for concerns as above. A caregiver was present when appropriate. Due to this being a Scientist, research (medical) (During COVID-19 public health emergency), evaluation of the following organ systems was limited: Vitals/Constitutional/EENT/Resp/CV/GI/GU/MS/Neuro/Skin/Heme-Lymph-Imm.   Pursuant to the emergency declaration under the [facility] Act and the [employer], 1135 waiver authority and the [employer] and [employer] Act, this Virtual *** Visit was conducted, with patient's (and/or legal guardian's) consent, to reduce the patient's risk of exposure to COVID-19 and provide necessary medical care.     Services were provided through a video*** synchronous discussion virtually to substitute for in-person clinic visit. Patient and provider were located at their individual home/office respectively.      We discussed the expected course, resolution and complications of the diagnosis(es) in detail.  Medication risks, benefits, costs, interactions, and alternatives were discussed as indicated.  I advised her to contact the office if her condition worsens, changes or fails to improve as anticipated. She expressed understanding with the diagnosis(es) and plan.     Darnelle Going, MD    Napoleon Form, who was evaluated through a synchronous (real-time) *** encounter, and/or her healthcare decision maker, is aware that it  is a billable service, which includes applicable co-pays, with coverage as determined by her insurance carrier. She provided verbal consent to proceed and patient identification was verified. This visit was conducted pursuant to the emergency declaration under the [employer] and the [employer], 1135 waiver authority and the [employer] and [employer] Act. A caregiver was present when appropriate. Ability to conduct physical exam was limited. The patient was located at: {Telehealth POS - Patient:[id number]}  The provider  was located at: {Telehealth POS - Provider:[id number]}    --Darnelle Going, MD on 04/09/2022 at 4:08 PM        Napoleon Form, was evaluated through a synchronous (real-time) audio-video encounter. The patient (or guardian if applicable) is aware that this is a billable service, which includes applicable co-pays. This Virtual Visit was conducted with patient's (and/or legal guardian's) consent. Patient identification was verified, and a caregiver was present when appropriate.   The patient was located at {Telehealth POS - Patient:[id number]}  Provider was located at {Telehealth POS - Provider:[id number]}  {STOP! Confirm you are appropriately licensed, registered, or certified to deliver care in the state where the patient is located as indicated above. If you are not or unsure, please re-schedule the visit. (Optional):63966}

## 2022-04-09 NOTE — Telephone Encounter (Signed)
From: Janina Mayo  To: Dr. Marius Ditch  Sent: 04/09/2022 1:38 PM EST  Subject: Nyoka Cowden mucus     I'm coughing up green mucus,may I have a prescription please?  Thank you

## 2022-04-09 NOTE — Progress Notes (Signed)
Cathy [name] presents today for   Chief Complaint   Patient presents with    Nasal Congestion    Cough     Coughing up green mucus since Sunday           "Have you been to the ER, urgent care clinic since your last visit?  Hospitalized since your last visit?"    NO    "Have you seen or consulted any other health care providers outside of [facility] since your last visit?"    NO

## 2022-05-05 NOTE — Progress Notes (Signed)
Formatting of this note is different from the original.  Images from the original note were not included.        Clinic Visit Nursing Assessment    05/05/2022  Cathy [name]  11/05/2018 (Heart)    Reason for visit : Routine             Transplant Symptoms:     Patient complains of  No complaints    Patient denies SOB, DOE, LE Edema, Chest pain, Loss of Appetite, Diarrhea, Nausea, Vomiting, Chills, and Fever        BP 140/65   Pulse 84   Wt 68.9 kg (152 lb)   SpO2 100%   BMI 22.45 kg/m   Wt Readings from Last 4 Encounters:   05/05/22 68.9 kg (152 lb)   02/03/22 66.5 kg (146 lb 9.6 oz)   11/04/21 67.1 kg (148 lb)   09/03/21 67.4 kg (148 lb 8 oz)     Home Medication List - Marked as Reviewed on 02/03/22 1600   Medication Sig   atorvastatin (LIPITOR) 10 mg PO TABS Take 1 Tab by Mouth Every Night at Bedtime.   brimonidine (ALPHAGAN) 0.2 % OP Drop Instill 1 Drop in both eyes Every 8 Hours.   cholecalciferol (VITAMIN D3) 25 mcg (1,000 unit) PO TABS Take 1 Tab by Mouth Once a Day.   dorzolamide (TRUSOPT) 2 % OP Drop Instill 1 Drop in both eyes 3 Times Daily.   furosemide (LASIX) 20 mg PO TABS Take 1 Tab by Mouth Take As Needed. Take one tab by mouth daily as needed for leg swelling or weight gain.   latanoprost (XALATAN) 0.005 % OP Drop Instill 1 Drop in both eyes Every Night at Bedtime.   mycophenolate (CELLCEPT) 250 mg PO CAPS Take 1 Cap by Mouth Twice Daily.   omeprazole (PRILOSEC) 20 mg PO CPDR Take 1 Cap by Mouth Every Morning Before Breakfast.   tacrolimus (PROGRAF) 1 mg PO CAPS Take 2 Caps by Mouth Every 12 hours. Take with '5mg'$  capsule for a total of '7mg'$  twice daily   tacrolimus (PROGRAF) 5 mg PO CAPS Take 1 Cap by Mouth Every 12 hours. Take with '1mg'$  caspules for a total '7mg'$  twice daily     Laqueta Jean, BSN, RN  Heart Transplant/VAD Coordinator  Advanced [facility] Failure Program  [phone]      Electronically signed by Wess Botts, RN at 05/05/2022  9:24 AM EDT

## 2022-05-05 NOTE — Progress Notes (Signed)
Formatting of this note is different from the original.  [facility] Progress Note     Patient:  Cathy [name]   DOB:  [birth year]    MRN:  [id number]   GENDER:  female   DATE: 05/05/2022, 9:17 AM     Referring Physician: Andria Meuse, MD  Heart Transplant Consultant:  Barron Alvine, MD, F.A.C.C., F.H.F.S.A    Chief Complaint:  Chief Complaint   Patient presents with    Follow-Up Office Visit    HEART RECIPIENT FOLLOW-UP     History of Present Illness:  Cathy [name] presents today to the Transplant Clinic now 3 years and 6 months status post  orthotopic heart transplant for non-ischemic cardiomyopathy.      She complains of no problems whatsoever.  She denies chest pain, chills, constipation, decreased appetite, diarrhea, difficulty chewing, difficulty swallowing, dysuria, fatigue, fever, fluid retention, headaches, night sweats, orthopnea, paroxysmal nocturnal dyspnea, presyncope, syncopal episodes, tremors, and vomiting.     IMPRESSION:  S/P Orthotopic Heart Transplantation and HMIII explant - 11/05/2018, CMV D+/R+  Chronic immunosuppression with tacrolimus and mycophenolate, tacrolimus goal at (4-8)   History of induction with thymoglobulin  HCV NAT+ donor, completed HCV treatment with Mavyret  Primary graft dysfunction s/p ECMO 11/05/2018-11/10/2018  History of respiratory failure s/p tracheostomy 11/29/2018, decannulated 04/13/2019  History of chylothorax, resolved  Right cavitary lung mass  Right hemidiaphragm  No recent history of clinically significant rejection, last EMB showed: 1R/1A 10/20/2019  Essential hypertension  Dyslipidemia  COVID 19 PNA 11/13/2019  History of fungal PNA    Plan:  Patient continues to do well, no complaints.  She is compliant with her medications.  Continue tacrolimus and mycophenolate for immunosuppression, will check tacrolimus trough level today, her goal is 4-8, pending level adjust dose accordingly.  Monitor closely for signs  and symptoms of infection or rejection.  Will check echocardiogram on her annual visit which will be in 6 months.  Her last echocardiogram which I personally reviewed and interpreted noted normal LV systolic function.  Blood pressure at goal, monitor closely keep BP log and bring it to clinic  Continue atorvastatin for dyslipidemia, check lipid panel on her annual visit  Medications were reconciled today.  Healthy lifestyle has been encouraged including avoidance of tobacco, heart healthy diet, regular exercise and maintaining an optimal BMI.   She will return to the clinic in 3 months.  She has our telephone number and has been instructed to call for any questions or problems.     History:  Pre-transplant history was complicated by history [place] and an EF 15% in 2018, originally diagnosed in 2008 and followed by [facility].  Milrinone added in 2019 and she underwent evaluation for MCS/OHT 03/2017. She had HM 3 LVAD implantation and TV repair with 46m MC3 annuloplasty band on 04/06/17. Pericardial effusion was noted and she returned to OR for mediastinal re-exploration and washout with removal of anterior mediastinal hematoma, repair of right atriotomy suture line and sternal closure. She continued to progress and was  discharged to Inpatient Rehab on 04/22/2017. She was discharged from rehab on 04/30/2017     She was taken to the OR 11/05/18 by Drs. UShirleen Schirmerand Phillpott. Surgical course complicated by primary graft dysfunction and severe intraoperative bleeding.      After orthotopic heart transplant she was noted to have primary graft dysfunction requiring central ECMO.  Poor biventricular function was noted coming off of the pump and patient  had severe intraoperative bleeding requiring factor VII blood transfusions and blood products.  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  Her LVAD and her AICD was explanted.  She remains on ventilator with active resuscitation going on with blood  products and vasopressors.  Due to concern for distended left ventricle and central ECMO decision was also made to insert an intra-aortic balloon pump to unload the left ventricle.  Her chest was left open.    Induction with thymoglobulin and Bedside IABP placed 9/12.      She had a tumultuous post op course complicated by delayed chest closure and multiple atrial arrhythmias with multiple DCCVs.    Her IABP removed 9/22. She suffered polymorphic VT arrest requiring defibrillation for 2 episodes following removal.    On 9/26 she suffered cardiac arrest with polymorphic ventricular tachycardia/ventricular fibrillation in setting of prolonged QT requiring emergent defibrillation - amiodarone discontinued.EP consulted and she required     While intubated in ICU, she required FMS with eventual Trach and PEG placement.     Nephrology consulted for AKI in setting of cardiogenic shock/bleeding.     She required CRRT 9/12-10/13/20 transitioned to conventional HD until 02/08/19. Diuretics adjusted   She was taken back to OR I&D of sternotomy wound with application of wound vac, CT sites debrided as well.  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     Thoracentesis performed 10/26 for chylous effusion -460m milky fluid removed. She returned to IR 11/2 for R sided CT placement for chylous leak- imaging c/w R sided empyema, culture from broch unrevealing.     Neurology consulted and Keppra added for witnessed seizure activity- (tonic-clonic, broke with ativan). EEG/CT without acute findings    [facility] returning to ICU 01/12/19 for hypercapneic respiratory failure requiring re initiation of mechanical ventilation. 01/13/19 L sided CT placed for pleural effusion. RUL cavitary PNA treated with antibiotics. Prolonged lethargy - mechanical vent weaned slowly to trach collar. Eventual transition back AHFU.   There was an  attempt to embolize the thoracic duct for chylous leak, however this was aborted as lymphatic contrast did not traverse to the lower thoracic duct.     Neurology consulted and Keppra added for witnessed seizure activity- (tonic-clonic, broke with ativan). EEG/CT without acute findings.      RUL cavitary lesion extensively imaged with prolonged course of IV antibiotics. She had an IR guide biopsy on 03/21/19 negative for malignancy noting necrotic debris with rare fibroblasts. Acid fast negative and Gm stain negative for fungus. quantiferon gold x 2 indeterminate. prolonged trach as unable to de cannulate due to profound weakness and episodes of apathy.   11/29/18 initial trach by ENT- Tracoe 8 DCT  01/04/19- exchanged 8 Tracoe DCT  01/06/19 - exchnaged 8 uncuffed Tracoe  01/11/19- exchanged -cuffless Tracoe 7 by ENT  04/06/19 exchanged/downsized shiley 4 cuffless  04/07/19- tolerating PMV- passed MBS for pureed and thin  Decannulated 04/24/19- intolerant of CPAP use     Prolonged episodes of patient being non verbal, severe depression prolonged stay and limited her improvement. Psychiatry consulted for depression treatment. Eventually placed on mix of antidepressives and ritalin with discontinuation of sedatives, narcotics and mexilitine for desired effects on mood and activity.     She eventually began to routinely work with PT/OT/ST now able to walk short distances.   PEG tube became dislodged and was replaced  as she was still dependent on TF for nutrition. As swallow improved diet has been advanced and now she is tolerating mechanical chopped with thin liquids.     After discontinuation of HD renal function recovered. She has required minimal oral diuretics.     Prednisone has been weaned prior to transfer out of ICU for critical host protocol.  MMF discontinued 11/13/18, steroids discontinued 11/22/18.     Valcyte CMV prophylaxis discontinued 04/05/19 for leukopenia     Mavyret completed for HCV+donor with seroconversion  while hospitalized - all HCV pcr remain negative since treatment.     COVID vaccines completed during her stay.     At the time of transfer to inpatient rehabilitation, she was afebrile, ambulating with assistance and eating, drinking and voiding without difficulty.  Her PEG remained in place but was not in use.       Hospitalized 10/10/19-10/12/19, Presented to [facility] ER for eval of SOB and episode of CP. Was found to O2 sat in the 70s. Pt underwent CP work up in the ER (Labs, CXR, EKG) and was found to have trop of 90 with repeat 43. Decision made to admit and have pt annual exam moved up a few weeks. CP resolved prior to pt coming to [facility]. Her work up was negative. Her tacrolimus was increased.     Patient was hospitalized 11/13/2019 for COVID 19 pneumonia.  Hospitalized 06/12/20-4/2 for cavitary fungal PNA with concerns for aspiration.. She is  s/p bronchoscopy on 4/22- cultures + candida albicans   She was treated with IV ceftriaxone and flagyl while hospitalized now completing moxifloxacin and ongoing fluconazone. Tacrolimus dose was reduced to '5mg'$  bid (previous dose was '7mg'$  bid)     Cardiac medications were reconciled  Atorvastatin 10 mg nightly  Furosemide 20 mg as needed- took one dose last week for abdominal bloating   Tacrolimus 5 mg every 12 hours    IMAGING:  I have reviewed and updated the following list of invasive and noninvasive studies and discussed results of any new data with the patient.    LHC 10/16/2020:  Normal Hemodynamics / Output  Normal Coronaries  ISHLT CAV 0     TTE 10/16/2020:  CONCLUSIONS    * Left ventricular chamber size, wall thickness, and systolic function are  normal with no regional wall motion abnormalities with an estimated ejection  fraction of 60 %.    * Left ventricular diastolic function: indeterminate.    * Right ventricular chamber size is normal with normal systolic function,  TAPSE 1.7 cm and TAPSV 11.50 cm/s.    * There is mild mitral valve regurgitation.    * Estimated  pulmonary arterial systolic pressure is 30 mmHg.    REVIEW OF SYSTEMS as below: Reviewed 05/05/2022    General: She denies fevers  HEENT: She denies eye pain  Pulmonary: She denies shortness of breath  Cardiac: She denies palpitations  GI: She reports diarrhea over the weekend  GU: She denies urinary urgency or frequency.  Endocrine: She denies heat or cold intolerance.  Dermatologic: She denies skin rash  Musculoskeletal: She denies muscle pain  Neuro: She denies seizures  Psychiatric: She denies major depression    Past Medical History:  Past Medical History:   Diagnosis Date    Acute respiratory failure with hypoxia ([place]) 123XX123    Acute systolic congestive heart failure ([place]) 06/29/2016    AKI (acute kidney injury) ([place]) 11/11/2018    Requiring CRRT, then HD    Atrial  tachycardia ([place])     Cardiogenic shock ([place]) 11/11/2018    Cardiomyopathy ([place])     CHF (congestive heart failure), NYHA class IV, acute on chronic, diastolic ([place]) [id number]    Chronic systolic congestive heart failure ([place]) 06/29/2016    Congestive heart failure (CHF) (HCC)     Constipation     Coronary artery disease     COVID-19 11/13/2019    per pt- mild but hospitalized due to hx of heart transplant    Encephalopathy 01/03/2019    Exercise tolerance finding 09/28/2020    denies cp/ sob with exertion    Fall 06/20/2017    High cholesterol     History of tracheostomy 2020    11/29/2018-04/13/2019    HTN (hypertension)     resolved    Hypothyroid     resolved    ICD (implantable cardioverter-defibrillator) in place     explanted 11/05/18    Injury of left radial artery     LVAD (left ventricular assist device) present [facility])     Mitral regurgitation     Nonischemic cardiomyopathy (HCC)     Overproduction of thyroid-stimulating hormone (TSH) 08/17/2006    Personal history of ECMO 01/03/2019    [facility]11 [facility] 9/16    Pneumonia due to infectious organism     s/p orthotopic heart transplant 12/17/2018 11/05/2018    Seizure ([place])     x1-   around time of craniotomy    Sleep apnea     no cpap currently    Torsades de pointes ([place])      Past Surgical History:  Past Surgical History:   Procedure Laterality Date    BRONCHOSCOPY N/A 06/15/2020    Procedure: BRONCHOSCOPY;  Surgeon: Ezequiel Essex, MD    CARDIAC ASSIST VENTRICULAR DEVICE/ VAD IMPLANTABLE INSERTION N/A 04/06/2017    Procedure: INSERTION, LVAD, INTRACORPOREAL;  Surgeon: Mardene Sayer, MD    CARDIAC SURGERY N/A 04/06/2017    Procedure: REPAIR, TRICUSPID VALVE;  Surgeon: Mardene Sayer, MD    CARDIAC SURGERY N/A 04/13/2017    Procedure: [place], IN IMMEDIATE OPEN HEART SURGERY POSTOPERATIVE PERIOD;  Surgeon: Donney Dice, MD    CARDIAC SURGERY N/A 11/06/2018    Procedure: Ria [place], IN IMMEDIATE OPEN HEART SURGERY POSTOPERATIVE PERIOD;  Surgeon: Arbie Cookey, MD    CARDIAC SURGERY N/A 11/06/2018    Procedure: Ria [place], IN IMMEDIATE OPEN HEART SURGERY POSTOPERATIVE PERIOD;  Surgeon: Arbie Cookey, MD    CARDIAC SURGERY N/A 11/10/2018    Procedure: Ria [place], IN IMMEDIATE OPEN HEART SURGERY POSTOPERATIVE PERIOD;  Surgeon: Earlean Polka, MD    CARDIOVERTER-DEFIBRILLATOR/ ICD LEAD/S REPLACEMENT Left 10/07/2018    Gen change by Dr Ignacia Marvel    COLONOSCOPY N/A 07/16/2017    Procedure: COLONOSCOPY;  Surgeon: Golda Acre, MD    CRANIOTOMY      benign tumor excised    EGD WITH PEG TUBE INSERTION N/A 12/02/2018    Procedure: EGD, WITH PERCUTANEOUS GASTROSTOMY TUBE INSERTION;  Surgeon: Blima Singer, MD    EXCISION NEUROFIBROMA/ [place] Left 10/01/2020    Procedure: EXCISION, NEUROFIBROMA;  Surgeon: Margo Common, MD    HEART TRANSPLANT N/A 11/05/2018    Procedure: TRANSPLANT, HEART;  Surgeon: Earlean Polka, MD    HYSTERECTOMY      INCISION & DRAINAGE COMPLEX POSTOPERATIVE WOUND INFECTION N/A 12/17/2018    Procedure: INCISION AND DRAINAGE COMPLEX POSTOPERATIVE WOUND INFECTION TRUNK;  Surgeon: Earlean Polka, MD  STERNUM CLOSURE N/A 11/14/2018    Procedure: CLOSURE, MEDIAN STERNOTOMY, WITH STERNAL DEBRIDEMENT;  Surgeon: Donney Dice, MD    TRACHEOSTOMY N/A 11/29/2018    Procedure: TRACHEOSTOMY;  Surgeon: Leeanne Deed, MD    TRANSESOPHAGEAL ECHOCARDIOGRAPHY/ TEE N/A 04/06/2017    Procedure: ECHOCARDIOGRAM, TRANSESOPHAGEAL;  Surgeon: Mardene Sayer, MD    TRANSESOPHAGEAL ECHOCARDIOGRAPHY/ TEE N/A 11/06/2018    Procedure: ECHOCARDIOGRAM, TRANSESOPHAGEAL;  Surgeon: Arbie Cookey, MD    TRANSESOPHAGEAL ECHOCARDIOGRAPHY/ TEE N/A 11/06/2018    Procedure: ECHOCARDIOGRAM, TRANSESOPHAGEAL;  Surgeon: Arbie Cookey, MD    TRANSESOPHAGEAL ECHOCARDIOGRAPHY/ TEE N/A 11/05/2018    Procedure: ECHOCARDIOGRAM, TRANSESOPHAGEAL;  Surgeon: Earlean Polka, MD    TRANSESOPHAGEAL ECHOCARDIOGRAPHY/ TEE N/A 11/10/2018    Procedure: ECHOCARDIOGRAM, TRANSESOPHAGEAL;  Surgeon: Earlean Polka, MD    TRANSESOPHAGEAL ECHOCARDIOGRAPHY/ TEE N/A 11/14/2018    Procedure: ECHOCARDIOGRAM, TRANSESOPHAGEAL;  Surgeon: Donney Dice, MD    VENOUS ACCESS CATHETER INSERTION Left 11/29/2018    Procedure: INSERTION, CATHETER, TUNNELED, FOR DIALYSIS;  Surgeon: Ned Grace, MD    VENOUS ACCESS CATHETER INSERTION N/A 01/07/2019    Procedure: TCVL placement;  Surgeon: Jeoffrey Massed, MD    VENOUS ACCESS CATHETER INSERTION Left 02/07/2019    Procedure: Tunnel Dialysis Catheter Insertion;  Surgeon: Ned Grace, MD     Family History:  Family History   Problem Relation Age of Onset    Cancer Brother         throat    Diabetes Mother     Heart Failure Mother     Heart Failure Father     Diabetes Father     Diabetes Sister        Social History:  Social History     Socioeconomic History    Marital status: Married     Spouse name: Not on file    Number of children: Not on file    Years of education: Not on file    Highest education level: Not on file   Occupational History    Not on file   Tobacco Use    Smoking status: Former      Types: Cigarettes    Smokeless tobacco: Never    Tobacco comments:     quit at age 73   Vaping Use    Vaping Use: Never used   Substance and Sexual Activity    Alcohol use: No    Drug use: No    Sexual activity: Not on file   Other Topics Concern    Not on file   Social History Narrative    Not on file     Social Determinants of Health     Financial Resource Strain: Not on file   Food Insecurity: Not on file   Transportation Needs: Not on file   Physical Activity: Not on file   Stress: Not on file   Social Connections: Not on file   Intimate Partner Violence: Not on file   Housing Stability: Not on file     Allergies:  Allergies   Allergen Reactions    Tramadol unknown and neurological reaction     Psychotic Hallucinations    Chlorhexidine rash/itching    Chlorhexidine Towelette rash/itching     CHG Wipes     Copper rash/itching     Copper sheets/ gown    Penicillamine rash/itching    Nsaids (Non-Steroidal Anti-Inflammatory Drug) other/intolerance    Penicillin rash/itching    Penicillins rash/itching     Itching bumps  on hands and arms     Physical Examination:     BP 114/59   Pulse 84   Wt 68.9 kg (152 lb)   SpO2 100%   BMI 22.45 kg/m     Wt Readings from Last 3 Encounters:   05/05/22 68.9 kg (152 lb)   02/03/22 66.5 kg (146 lb 9.6 oz)   11/04/21 67.1 kg (148 lb)     CONSTITUTIONAL: well developed, nourished, no distress, alert and oriented x 3, and husband present during encounter  CARDIOVASCULAR: regular rate and rhythm, S1, S2 normal, no murmur, click, rub or gallop  PULMONARY/CHEST WALL: breath sounds normal and effort normal  ABDOMINAL: appearance normal and soft  NEUROLOGICAL: awake, alert and oriented x 3   HENT: atraumatic, nose normal and normocephalic  EYES: conjunctiva normal and EOM normal  NECK: ROM normal, supple and trachea normal  GENITOURINARY:  deferred  MUSCULOSKELETAL: normal ROM  PSYCH: No signs of mood disorder.  EXTREMITIES: No LE edema    Laboratory Data:  Lab Results   Component Value  Date    HEMOGLOBIN 10.3 (L) 02/03/2022    HCT 33.8 (L) 02/03/2022    WBC 1.9 (L) 02/03/2022    PLATELET 151 02/03/2022     Lab Results   Component Value Date    NA 136 02/14/2022    POTASSIUM 5.1 02/14/2022    CHLORIDE 102 02/14/2022    CO2 25 02/14/2022    BUN 24 (H) 02/14/2022    CREAT 1.0 02/14/2022    GLUCOSE 109 (H) 02/14/2022     Lab Results   Component Value Date    ALBUMIN 4.5 11/01/2021    TOTPR 6.8 11/01/2021    SGOTAST 12 11/01/2021    SGPTALT 14 11/01/2021    ALKPHOS 115 11/01/2021    AGRAT 2.0 11/01/2021    BILIT 0.3 11/01/2021    BILID <0.2 11/01/2021    GLOBULIN 2.3 11/01/2021     Lab Results   Component Value Date    MAGNESIUM 1.9 11/01/2021    CALCIUM 9.5 02/14/2022     Lab Results   Component Value Date    PROGRAF 6.2 02/14/2022    PROGRAF 8.8 02/03/2022    PROGRAF 6.6 11/01/2021     Lab Results   Component Value Date    CPK 43 11/01/2021     Lab Results   Component Value Date/Time    CHOLESTEROL 158 11/01/2021 10:27 AM    HDL 67 11/01/2021 10:27 AM    LDLIPO 77 11/01/2021 10:27 AM    TRIGLYCERIDE 66 11/01/2021 10:27 AM     Medications:  Current Outpatient Medications   Medication Sig Dispense Refill    atorvastatin (LIPITOR) 10 mg PO TABS Take 1 Tab by Mouth Every Night at Bedtime. 90 Tab 3    brimonidine (ALPHAGAN) 0.2 % OP Drop Instill 1 Drop in both eyes Every 8 Hours.      cholecalciferol (VITAMIN D3) 25 mcg (1,000 unit) PO TABS Take 1 Tab by Mouth Once a Day.      dorzolamide (TRUSOPT) 2 % OP Drop Instill 1 Drop in both eyes 3 Times Daily.      furosemide (LASIX) 20 mg PO TABS Take 1 Tab by Mouth Take As Needed. Take one tab by mouth daily as needed for leg swelling or weight gain. 15 Tab 3    latanoprost (XALATAN) 0.005 % OP Drop Instill 1 Drop in both eyes Every Night at Bedtime.      mycophenolate (  CELLCEPT) 250 mg PO CAPS Take 1 Cap by Mouth Twice Daily. 180 Cap 3    omeprazole (PRILOSEC) 20 mg PO CPDR Take 1 Cap by Mouth Every Morning Before Breakfast. 90 Cap 3    tacrolimus (PROGRAF) 1  mg PO CAPS Take 2 Caps by Mouth Every 12 hours. Take with '5mg'$  capsule for a total of '7mg'$  twice daily 120 Cap 11    tacrolimus (PROGRAF) 5 mg PO CAPS Take 1 Cap by Mouth Every 12 hours. Take with '1mg'$  caspules for a total '7mg'$  twice daily       No current facility-administered medications for this encounter.     Facility-Administered Medications Ordered in Other Encounters   Medication Dose Route Frequency Provider Last Rate Last Admin    lidocaine (ELA-MAX) 4 % cream 1 Application  1 Application  Topical On Call Herre, Latricia Heft, MD         The Dragon dictation tool was used in preparation of this note, so please excuse typos/grammatical errors.  If you have questions regarding details of the note please call my office at [phone] and make an appointment to discuss your concerns.    Barron Alvine, MD MS [facility]  Advanced Heart Failure, MCS, and Heart Transplant Cardiologist  Pager: [phone], dial [phone], then call back number  Electronically signed by Barron Alvine, MD at 05/05/2022  9:38 AM EDT

## 2022-06-05 ENCOUNTER — Inpatient Hospital Stay: Admit: 2022-06-05 | Payer: MEDICARE | Primary: Family Medicine

## 2022-06-05 ENCOUNTER — Ambulatory Visit: Admit: 2022-06-05 | Discharge: 2022-06-05 | Payer: MEDICARE | Primary: Family Medicine

## 2022-06-05 DIAGNOSIS — R739 Hyperglycemia, unspecified: Secondary | ICD-10-CM

## 2022-06-05 LAB — TSH + FREE T4 PANEL
T4 Free: 1.1 NG/DL (ref 0.7–1.5)
TSH, 3rd Generation: 2.95 u[IU]/mL (ref 0.36–3.74)

## 2022-06-05 LAB — HEMOGLOBIN A1C
Estimated Avg Glucose: 123 mg/dL
Hemoglobin A1C: 5.9 % — ABNORMAL HIGH (ref 4.2–5.6)

## 2022-06-12 ENCOUNTER — Ambulatory Visit: Admit: 2022-06-12 | Discharge: 2022-06-12 | Payer: MEDICARE | Attending: Family Medicine | Primary: Family Medicine

## 2022-06-12 DIAGNOSIS — R739 Hyperglycemia, unspecified: Secondary | ICD-10-CM

## 2022-06-12 DIAGNOSIS — Z941 Heart transplant status: Secondary | ICD-10-CM

## 2022-06-12 NOTE — Progress Notes (Unsigned)
[facility]:  06/12/2022, MRN: [id number]      Cathy [name] is a 72 y.o. female and presents to clinic for Follow-up (6 mon f/u)      Subjective:   The patient is a 72 year old female with history of hypertension, CHF, mitral regurgitation, prediabetes, cholelithiasis, asthma, multinodular goiter, obstructive sleep apnea (not on rx), allergic rhinitis, osteoporosis, uterine fibroids per EHR, insomnia, cataracts, dysphagia from achalasia?, LVAD placement in 2019, AICD placement, S/p heart transplant (2020, followed by [facility]), seizure, left distal radial artery occlusion (2020), status post removal of LLE schwannoma by Dr.Balsalmo, left Baker's cyst, Covid-19 (2021), HLD, and meningioma status post resection.     1.  CHF: Treated with Lasix 20 mg daily.  She is a transplant recipient (heart) patent treated with CellCept 200 mg twice daily and tacrolimus 8 mg twice daily.  Blood pressure is 146.  Heart rate 81. TFTs per recent lab work are normal.  She is on aspirin 325 mg daily in addition to Lipitor 10 mg daily. She has occasional CP - but sx are chronic since she received her heart transplant. She sees her transplant team in June.     2.  Prediabetes: Her A1c for her recent labs is 5.9.        Patient Active Problem List    Diagnosis Date Noted    Heart transplant status (HCC) 05/14/2021    Stage 3a chronic kidney disease (HCC) 08/26/2021    Abnormal MRI 04/30/2020    Mild persistent asthma without complication 08/30/2019    History of seizures 08/30/2019    Thyroid nodule 08/30/2019    Anemia 08/30/2019    Depression 08/30/2019    Esophageal dysphagia 03/17/2017    Multinodular goiter 03/04/2016    History of meningioma 03/04/2016    Age-related osteoporosis without current pathological fracture 03/04/2016    Mixed hyperlipidemia     Chronic systolic heart failure (HCC)     Non-rheumatic mitral regurgitation 04/25/2014    AR (allergic rhinitis) 09/29/2011    AICD (automatic  cardioverter/defibrillator) present 11/15/2010    Cataract 12/01/2008    Gallstones 11/13/2008    OSA (obstructive sleep apnea) 11/13/2008    Fibroid 06/23/2008    HTN (hypertension) 06/23/2008       Current Outpatient Medications   Medication Sig Dispense Refill    benzonatate (TESSALON) 100 MG capsule Take 1 capsule by mouth 3 times daily as needed for Cough 30 capsule 1    atorvastatin (LIPITOR) 10 MG tablet daily      brimonidine (ALPHAGAN) 0.2 % ophthalmic solution ceived the following from [facility]: Outside name: brimonidine (ALPHAGAN) 0.2 % ophthalmic solution      dorzolamide (TRUSOPT) 2 % ophthalmic solution ceived the following from [facility]: Outside name: dorzolamide (TRUSOPT) 2 % ophthalmic solution      furosemide (LASIX) 20 MG tablet Take 1 tablet by mouth daily as needed      mycophenolate (CELLCEPT) 500 MG tablet Take 0.5 tablets by mouth 2 times daily      omeprazole (PRILOSEC) 20 MG delayed release capsule ceived the following from [facility]: Outside name: omeprazole (PRILOSEC) 20 mg capsule      prednisoLONE acetate (PRED FORTE) 1 % ophthalmic suspension ceived the following from [facility]: Outside name: prednisoLONE acetate (PRED FORTE) 1 % ophthalmic suspension      tacrolimus (PROGRAF) 1 MG capsule 8 capsules 2 times daily  acetaminophen (TYLENOL) 325 MG tablet Take by mouth every 4 hours as needed (Patient not taking: Reported on 04/09/2022)      aspirin 325 MG tablet daily (Patient not taking: Reported on 04/09/2022)      latanoprost (XALATAN) 0.005 % ophthalmic solution ceived the following from [facility]: Outside name: latanoprost (XALATAN) 0.005 % ophthalmic solution (Patient not taking: Reported on 04/09/2022)      melatonin 3 MG TABS tablet Take 1 tablet by mouth (Patient not taking: Reported on 06/12/2022)       No current facility-administered medications for this visit.       Allergies    Allergen Reactions    Penicillamine Itching    Penicillins Itching and Rash     Itching bumps on hands and arms  Itching bumps on hands and arms  Itching bumps on hands and arms  Has patient had a PCN reaction causing immediate rash, facial/tongue/throat swelling, SOB or lightheadedness with hypotension: Yes  Has patient had a PCN reaction causing severe rash involving mucus membranes or skin necrosis: No  Has patient had a PCN reaction that required hospitalization unknown  Has patient had a PCN reaction occurring within the last 10 years: No  If all of the above answers are "NO", then may proceed with Cephalosporin use.    Itching bumps on hands and arms    Tramadol Other (See Comments)     Psychotic Hallucinations  Psychotic Hallucinations  hallucinations  Psychotic Hallucinations    Chlorhexidine Itching     CHG Wipes    Copper Itching       Past Medical History:   Diagnosis Date    Age related osteoporosis     Automatic implantable cardiac defibrillator in situ     Post ddd icd Set up [facility]    Baker cyst, left 12/2016    Breast mass, left     benign    CHF (congestive heart failure) (HCC) 06/23/2008    Non-ischemic CMP , Cath (2008) Normal coronary arteries    Chronic systolic heart failure (HCC)     Stable,     Degenerative arthritis of left knee     Fibroid 06/23/2008    Gastric intestinal metaplasia     History of brain tumor 2013    HLD (hyperlipidemia)     HTN (hypertension) 06/23/2008    Hypotension, unspecified     Related to lisinopril    Hypothyroid 06/23/2008    radioactive iodine ablation of thyroid    Left knee pain     With swelling    Leg pain     LVAD (left ventricular assist device) present (HCC) 03/2017    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     OSA (obstructive sleep apnea) 10/2008    PUD (peptic ulcer disease) 2018    small superficial gastric ulcer    Venous reflux     Wears glasses        Past Surgical History:   Procedure Laterality Date    BREAST BIOPSY       rt breast, benign    CARDIAC DEFIBRILLATOR PLACEMENT      CHOLECYSTECTOMY      CRANIOTOMY  2013    meningioma     CT GUIDED CHEST TUBE  01/13/2019    CT GUIDED CHEST TUBE 01/13/2019    CT NEEDLE BIOPSY LUNG PERCUTANEOUS  03/21/2019    CT NEEDLE BIOPSY LUNG PERCUTANEOUS 03/21/2019  ESOPHAGEAL MOTILITY STUDY  02/16/2017         HEENT      glasses    HYSTERECTOMY (CERVIX STATUS UNKNOWN)      HYSTERECTOMY, TOTAL ABDOMINAL (CERVIX REMOVED)  1996    PACEMAKER  2013    dual chamber icd-removed 2020    PR UNLISTED PROCEDURE CARDIAC SURGERY  11/05/2018    heart transplant    TONSILLECTOMY AND ADENOIDECTOMY      TUBAL LIGATION      XR MIDLINE EQUAL OR GREATER THAN 5 YEARS  02/09/2019    XR MIDLINE EQUAL OR GREATER THAN 5 YEARS 02/09/2019    XR MIDLINE EQUAL OR GREATER THAN 5 YEARS  04/16/2017    XR MIDLINE EQUAL OR GREATER THAN 5 YEARS 04/16/2017       Family History   Problem Relation Age of Onset    Diabetes Mother     Heart Attack Father         MI    Diabetes Daughter     Other Daughter         leukemia    Heart Disease Maternal Grandmother     Heart Disease Paternal Grandmother     Kidney Disease Other         1 sib deceased    Cancer Other         1 sib throat cancer    Diabetes Other     Hypertension Other     Stomach Cancer Maternal Uncle     Stomach Cancer Maternal Uncle        Social History     Tobacco Use    Smoking status: Never    Smokeless tobacco: Never   Substance Use Topics    Alcohol use: Not Currently     Alcohol/week: 20.0 standard drinks of alcohol       ROS   Review of Systems      Objective     BP 104/63 (Site: Left Upper Arm, Position: Sitting, Cuff Size: Medium Adult)   Pulse 81   Temp 98.6 F (37 C) (Temporal)   Resp 17   Ht 1.778 m (5\' 10" )   Wt 68 kg (150 lb)   SpO2 100%   BMI 21.52 kg/m      Physical Exam      LABS   Data Review:   Lab Results   Component Value Date/Time    WBC 6.1 08/30/2019 01:00 PM    HGBPOC 11.6 01/28/2018 02:24 PM    HGB 8.6 08/30/2019 01:00 PM    HCTPOC 34 01/28/2018  02:24 PM    HCT 30.3 08/30/2019 01:00 PM    PLT 212 08/30/2019 01:00 PM    MCV 88.9 08/30/2019 01:00 PM       Lab Results   Component Value Date/Time    NA 139 08/03/2020 01:00 PM    K 5.1 12/31/2020 07:06 AM    CL 109 08/03/2020 01:00 PM    CO2 24 08/03/2020 01:00 PM    BUN 48 08/03/2020 01:00 PM    GFRAA 26 08/03/2020 01:00 PM       Lab Results   Component Value Date/Time    CHOL 170 08/30/2019 01:00 PM    HDL 64 08/30/2019 01:00 PM       No results found for: "HBA1C", "HBA1CPOC"    Assessment/Plan:   There are no diagnoses linked to this encounter.      No diagnosis found.      Health  Maintenance Due   Topic Date Due    DTaP/Tdap/Td vaccine (1 - Tdap) Never done    Shingles vaccine (1 of 2) Never done    Respiratory Syncytial Virus (RSV) Pregnant or age 21 yrs+ (1 - 1-dose 60+ series) Never done    COVID-19 Vaccine (3 - Pfizer risk series) 06/09/2019    GFR test (Diabetes, CKD 3-4, OR last GFR 15-59)  08/03/2021    Lipids  10/16/2021         Lab review: {lab reviewed:315731}    I have discussed the diagnosis with the patient and the intended plan as seen in the above orders.  The patient has received an after-visit summary and questions were answered concerning future plans.  I have discussed medication side effects and warnings with the patient as well. I have reviewed the plan of care with the patient, accepted their input and they are in agreement with the treatment goals. All questions were answered. The patient understands the plan of care. Handouts provided today with above information. ***Pt instructed if symptoms worsen to call the office or report to the ED for continued care.  ***Greater than 50% of the visit time was spent in counseling and/or coordination of care.  ***The patient was counseled on the dangers of tobacco use, and was {MU SMOKING CESSATION COUNSELING:20090::"advised to quit"}.  Reviewed strategies to maximize success, including {techniques:20091}.3-10 minutes were spent on  smoking/tobacco cessation    Voice recognition was used to generate this report, which may have resulted in some phonetic based errors in grammar and contents. Even though attempts were made to correct all the mistakes, some may have been missed, and remained in the body of the document.      No follow-up provider specified.    Darnelle Going, MD

## 2022-06-12 NOTE — Patient Instructions (Signed)
Home Blood Pressure Test: About This Test  What is it?     A home blood pressure test allows you to keep track of your blood pressure at home. Blood pressure is a measure of the force of blood against the walls of your arteries. Blood pressure readings include two numbers, such as 130/80 (say "130 over 80"). The first number is the systolic pressure. The second number is the diastolic pressure.  Why is this test done?  You may do this test at home to:  Find out if you have high blood pressure.  Track your blood pressure if you have high blood pressure.  Track how well medicine is working to reduce high blood pressure.  Check how lifestyle changes, such as weight loss and exercise, are affecting blood pressure.  How do you prepare for the test?  For at least 30 minutes before you take your blood pressure, don't exercise, drink caffeine, or smoke. Empty your bladder before the test. Sit quietly with your back straight and both feet on the floor for at least 5 minutes. This helps you take your blood pressure while you feel comfortable and relaxed.  How is the test done?  If your doctor recommends it, take your blood pressure twice a day. Take it in the morning and evening.  Sit with your arm slightly bent and resting on a table so that your upper arm is at the same level as your heart.  Use the same arm each time you take your blood pressure.  Place the blood pressure cuff on the bare skin of your upper arm. You may have to roll up your sleeve, remove your arm from the sleeve, or take your shirt off.  Wrap the blood pressure cuff around your upper arm so that the lower edge of the cuff is about 1 inch above the bend of your elbow.  Do not move, talk, or text while you take your blood pressure.  Follow the instructions that came with your blood pressure monitor. They might be different from the following.  Press the on/off button on the automatic monitor. Then you may need to wait until the screen says the monitor  is ready.  Press the start button. The cuff will inflate and deflate by itself.  Your blood pressure numbers will appear on the screen.  Wait one minute and take your blood pressure again.  If your monitor does not automatically save your numbers, write them in your log book, along with the date and time.  Follow-up care is a key part of your treatment and safety. Be sure to make and go to all appointments, and call your doctor if you are having problems. It's also a good idea to keep a list of the medicines you take.  Where can you learn more?  Go to https://www.healthwise.net/patientEd and enter C427 to learn more about "Home Blood Pressure Test: About This Test."  Current as of: October 31, 2020               Content Version: 13.5   2006-2022 [employer].   Care instructions adapted under license by [facility]. If you have questions about a medical condition or this instruction, always ask your healthcare professional. [employer] disclaims any warranty or liability for your use of this information.

## 2022-06-12 NOTE — Progress Notes (Signed)
Cathy [name] presents today for   Chief Complaint   Patient presents with    Follow-up     6 mon f/u           "Have you been to the ER, urgent care clinic since your last visit?  Hospitalized since your last visit?"    NO    "Have you seen or consulted any other health care providers outside of [facility] since your last visit?"    YES - When: approximately 1 months ago.  Where and Why: Cardio-f/u.

## 2022-11-06 LAB — HEMOGLOBIN A1C
Estimated Avg Glucose, External: 132 mg/dL — ABNORMAL HIGH (ref 91–123)
Hemoglobin A1C, External: 6.2 % — ABNORMAL HIGH (ref 4.8–5.6)

## 2022-12-15 ENCOUNTER — Encounter: Payer: MEDICARE | Primary: Family Medicine

## 2022-12-16 ENCOUNTER — Inpatient Hospital Stay: Admit: 2022-12-16 | Payer: MEDICARE | Primary: Family Medicine

## 2022-12-16 ENCOUNTER — Encounter: Admit: 2022-12-16 | Discharge: 2022-12-16 | Payer: MEDICARE | Primary: Family Medicine

## 2022-12-16 DIAGNOSIS — I5022 Chronic systolic (congestive) heart failure: Secondary | ICD-10-CM

## 2022-12-17 LAB — CBC WITH AUTO DIFFERENTIAL
Basophils %: 0 % (ref 0–2)
Basophils Absolute: 0 10*3/uL (ref 0.0–0.1)
Eosinophils %: 1 % (ref 0–5)
Eosinophils Absolute: 0 10*3/uL (ref 0.0–0.4)
Hematocrit: 34.5 % — ABNORMAL LOW (ref 35.0–45.0)
Hemoglobin: 10.3 g/dL — ABNORMAL LOW (ref 12.0–16.0)
Immature Granulocytes %: 0 % (ref 0.0–0.5)
Immature Granulocytes Absolute: 0 10*3/uL (ref 0.00–0.04)
Lymphocytes %: 22 % (ref 21–52)
Lymphocytes Absolute: 0.8 10*3/uL — ABNORMAL LOW (ref 0.9–3.6)
MCH: 28.9 pg (ref 24.0–34.0)
MCHC: 29.9 g/dL — ABNORMAL LOW (ref 31.0–37.0)
MCV: 96.9 fL (ref 78.0–100.0)
MPV: 11 fL (ref 9.2–11.8)
Monocytes %: 7 % (ref 3–10)
Monocytes Absolute: 0.3 10*3/uL (ref 0.05–1.2)
Neutrophils %: 70 % (ref 40–73)
Neutrophils Absolute: 2.6 10*3/uL (ref 1.8–8.0)
Nucleated RBCs: 0 /100{WBCs}
Platelets: 163 10*3/uL (ref 135–420)
RBC: 3.56 M/uL — ABNORMAL LOW (ref 4.20–5.30)
RDW: 13.8 % (ref 11.6–14.5)
WBC: 3.7 10*3/uL — ABNORMAL LOW (ref 4.6–13.2)
nRBC: 0 10*3/uL (ref 0.00–0.01)

## 2022-12-17 LAB — HEMOGLOBIN A1C
Estimated Avg Glucose: 131 mg/dL
Hemoglobin A1C: 6.2 % — ABNORMAL HIGH (ref 4.2–5.6)

## 2022-12-17 LAB — COMPREHENSIVE METABOLIC PANEL
ALT: 26 U/L (ref 13–56)
AST: 12 U/L (ref 10–38)
Albumin/Globulin Ratio: 1.4 (ref 0.8–1.7)
Albumin: 4 g/dL (ref 3.4–5.0)
Alk Phosphatase: 118 U/L — ABNORMAL HIGH (ref 45–117)
Anion Gap: 6 mmol/L (ref 3.0–18)
BUN/Creatinine Ratio: 22 — ABNORMAL HIGH (ref 12–20)
BUN: 26 mg/dL — ABNORMAL HIGH (ref 7.0–18)
CO2: 23 mmol/L (ref 21–32)
Calcium: 9.1 mg/dL (ref 8.5–10.1)
Chloride: 109 mmol/L (ref 100–111)
Creatinine: 1.19 mg/dL (ref 0.6–1.3)
Est, Glom Filt Rate: 49 mL/min/{1.73_m2} — ABNORMAL LOW (ref >60)
Globulin: 2.8 g/dL (ref 2.0–4.0)
Glucose: 129 mg/dL — ABNORMAL HIGH (ref 74–99)
Potassium: 5.5 mmol/L (ref 3.5–5.5)
Sodium: 138 mmol/L (ref 136–145)
Total Bilirubin: 0.5 mg/dL (ref 0.2–1.0)
Total Protein: 6.8 g/dL (ref 6.4–8.2)

## 2022-12-17 LAB — LIPID PANEL
Chol/HDL Ratio: 2 (ref 0–5.0)
Cholesterol, Total: 163 mg/dL (ref <200)
HDL: 81 mg/dL — ABNORMAL HIGH (ref 40–60)
LDL Cholesterol: 65.8 mg/dL (ref 0–100)
Triglycerides: 81 mg/dL (ref <150)
VLDL Cholesterol Calculated: 16.2 mg/dL

## 2022-12-22 ENCOUNTER — Encounter: Payer: MEDICARE | Attending: Family Medicine | Primary: Family Medicine

## 2022-12-30 ENCOUNTER — Ambulatory Visit: Admit: 2022-12-30 | Discharge: 2022-12-30 | Payer: MEDICARE | Attending: Family Medicine | Primary: Family Medicine

## 2022-12-30 DIAGNOSIS — R739 Hyperglycemia, unspecified: Secondary | ICD-10-CM

## 2022-12-30 NOTE — Patient Instructions (Signed)
Learning About Being Active as an Older Adult  Why is being active important as you get older?     Being active is one of the best things you can do for your health. And it's never too late to start. Being active--or getting active, if you aren't already--has definite benefits. It can:  Give you more energy,  Keep your mind sharp.  Improve balance to reduce your risk of falls.  Help you manage chronic illness with fewer medicines.  No matter how old you are, how fit you are, or what health problems you have, there is a form of activity that will work for you. And the more physical activity you can do, the better your overall health will be.  What kinds of activity can help you stay healthy?  Being more active will make your daily activities easier. Physical activity includes planned exercise and things you do in daily life. There are four types of activity:  Aerobic.  Doing aerobic activity makes your heart and lungs strong.  Includes walking, dancing, and gardening.  Aim for at least 2 hours spread throughout the week.  It improves your energy and can help you sleep better.  Muscle-strengthening.  This type of activity can help maintain muscle and strengthen bones.  Includes climbing stairs, using resistance bands, and lifting or carrying heavy loads.  Aim for at least twice a week.  It can help protect the knees and other joints.  Stretching.  Stretching gives you better range of motion in joints and muscles.  Includes upper arm stretches, calf stretches, and gentle yoga.  Aim for at least twice a week, preferably after your muscles are warmed up from other activities.  It can help you function better in daily life.  Balancing.  This helps you stay coordinated and have good posture.  Includes heel-to-toe walking, tai chi, and certain types of yoga.  Aim for at least 3 days a week.  It can reduce your risk of falling.  Even if you have a hard time meeting the recommendations, it's better to be more active  than less active. All activity done in each category counts toward your weekly total. You'd be surprised how daily things like carrying groceries, keeping up with grandchildren, and taking the stairs can add up.  What keeps you from being active?  If you've had a hard time being more active, you're not alone. Maybe you remember being able to do more. Or maybe you've never thought of yourself as being active. It's frustrating when you can't do the things you want. Being more active can help. What's holding you back?  Getting started.  Have a goal, but break it into easy tasks. Small steps build into big accomplishments.  Staying motivated.  If you feel like skipping your activity, remember your goal. Maybe you want to move better and stay independent. Every activity gets you one step closer.  Not feeling your best.  Start with 5 minutes of an activity you enjoy. Prove to yourself you can do it. As you get comfortable, increase your time.  You may not be where you want to be. But you're in the process of getting there. Everyone starts somewhere.  How can you find safe ways to stay active?  Talk with your doctor about any physical challenges you're facing. Make a plan with your doctor if you have a health problem or aren't sure how to get started with activity.  If you're already active, ask your doctor if  there is anything you should change to stay safe as your body and health change.  If you tend to feel dizzy after you take medicine, avoid activity at that time. Try being active before you take your medicine. This will reduce your risk of falls.  If you plan to be active at home, make sure to clear your space before you get started. Remove things like TV cords, coffee tables, and throw rugs. It's safest to have plenty of space to move freely.  The key to getting more active is to take it slow and steady. Try to improve only a little bit at a time. Pick just one area to improve on at first. And if an activity hurts,  stop and talk to your doctor.  Where can you learn more?  Go to [employer] and enter P600 to learn more about "Learning About Being Active as an Older Adult."  Current as of: July 29, 2021  Content Version: 14.2   [address], [place].   Care instructions adapted under license by [facility]. If you have questions about a medical condition or this instruction, always ask your healthcare professional. [employer] disclaims any warranty or liability for your use of this information.           Learning About Dental Care for Older Adults  Dental care for older adults: Overview  Dental care for older people is much the same as for younger adults. But older adults do have concerns that younger adults do not. Older adults may have problems with gum disease and decay on the roots of their teeth. They may need missing teeth replaced or broken fillings fixed. Or they may have dentures that need to be cared for. Some older adults may have trouble holding a toothbrush.  You can help remind the person you are caring for to brush and floss their teeth or to clean their dentures. In some cases, you may need to do the brushing and other dental care tasks. People who have trouble using their hands or who have dementia may need this extra help.  How can you help with dental care?  Normal dental care  To keep the teeth and gums healthy:  Brush the teeth with fluoride toothpaste twice a day--in the morning and at night--and floss at least once a day. Plaque can quickly build up on the teeth of older adults.  Watch for the signs of gum disease. These signs include gums that bleed after brushing or after eating hard foods, such as apples.  See a dentist regularly. Many experts recommend checkups every 6 months.  Keep the dentist up to date on any new medications the person is taking.  Encourage a balanced diet that includes whole grains, vegetables, and fruits, and that is low in saturated  fat and sodium.  Encourage the person you're caring for not to use tobacco products. They can affect dental and general health.  Many older adults have a fixed income and feel that they can't afford dental care. But most towns and cities have programs in which dentists help older adults by lowering fees. Contact your area's public health offices or social services for information about dental care in your area.  Using a toothbrush  Older adults with arthritis sometimes have trouble brushing their teeth because they can't easily hold the toothbrush. Their hands and fingers may be stiff, painful, or weak. If this is the case, you can:  Offer an Mining engineer toothbrush.  Enlarge the handle  of a non-electric toothbrush by wrapping a sponge, an elastic bandage, or adhesive tape around it.  Push the toothbrush handle through a ball made of rubber or soft foam.  Make the handle longer and thicker by taping Popsicle sticks or tongue depressors to it.  You may also be able to buy special toothbrushes, toothpaste dispensers, and floss holders.  Your doctor may recommend a soft-bristle toothbrush if the person you care for bleeds easily. Bleeding can happen because of a health problem or from certain medicines.  A toothpaste for sensitive teeth may help if the person you care for has sensitive teeth.  How do you brush and floss someone's teeth?  If the person you are caring for has a hard time cleaning their teeth on their own, you may need to brush and floss their teeth for them. It may be easiest to have the person sit and face away from you, and to sit or stand behind them. That way you can steady their head against your arm as you reach around to floss and brush their teeth. Choose a place that has good lighting and is comfortable for both of you.  Before you begin, gather your supplies. You will need gloves, floss, a toothbrush, and a container to hold water if you are not near a sink. Wash and dry your hands well and put on  gloves. Start by flossing:  Gently work a piece of floss between each of the teeth toward the gums. A plastic flossing tool may make this easier, and they are available at most drugstores.  Curve the floss around each tooth into a U-shape and gently slide it under the gum line.  Move the floss firmly up and down several times to scrape off the plaque.  After you've finished flossing, throw away the used floss and begin brushing:  Wet the brush and apply toothpaste.  Place the brush at a 45-degree angle where the teeth meet the gums. Press firmly, and move the brush in small circles over the surface of the teeth.  Be careful not to brush too hard. Vigorous brushing can make the gums pull away from the teeth and can scratch the tooth enamel.  Brush all surfaces of the teeth, on the tongue side and on the cheek side. Pay special attention to the front teeth and all surfaces of the back teeth.  Brush chewing surfaces with short back-and-forth strokes.  After you've finished, help the person rinse the remaining toothpaste from their mouth.  Where can you learn more?  Go to [employer] and enter F944 to learn more about "Learning About Dental Care for Older Adults."  Current as of: September 29, 2021  Content Version: 14.2   [address] [place].   Care instructions adapted under license by [facility]. If you have questions about a medical condition or this instruction, always ask your healthcare professional. [employer] disclaims any warranty or liability for your use of this information.           Learning About Vision Tests  What are vision tests?     The four most common vision tests are visual acuity tests, refraction, visual field tests, and color vision tests.  Visual acuity (sharpness) tests  These tests are used:  To see if you need glasses or contact lenses.  To monitor an eye problem.  To check an eye injury.  Visual acuity tests are done as part of routine exams.  You may also have this test  when you get your driver's license or apply for some types of jobs.  Visual field tests  These tests are used:  To check for vision loss in any area of your range of vision.  To screen for certain eye diseases.  To look for nerve damage after a stroke, head injury, or other problem that could reduce blood flow to the brain.  Refraction and color tests  A refraction test is done to find the right prescription for glasses and contact lenses.  A color vision test is done to check for color blindness.  Color vision is often tested as part of a routine exam. You may also have this test when you apply for a job where recognizing different colors is important, such as truck driving, Optician, dispensing, or the [employer].  How are vision tests done?  Visual acuity test   You cover one eye at a time.  You read aloud from a wall chart across the room.  You read aloud from a small card that you hold in your hand.  Refraction   You look into a special device.  The device puts lenses of different strengths in front of each eye to see how strong your glasses or contact lenses need to be.  Visual field tests   Your doctor may have you look through special machines.  Or your doctor may simply have you stare straight ahead while they move a finger into and out of your field of vision.  Color vision test   You look at pieces of printed test patterns in various colors. You say what number or symbol you see.  Your doctor may have you trace the number or symbol using a pointer.  How do these tests feel?  There is very little chance of having a problem from this test. If dilating drops are used for a vision test, they may make the eyes sting and cause a medicine taste in the mouth.  Follow-up care is a key part of your treatment and safety. Be sure to make and go to all appointments, and call your doctor if you are having problems. It's also a good idea to know your test results and keep a list of the medicines you  take.  Where can you learn more?  Go to [employer] and enter G551 to learn more about "Learning About Vision Tests."  Current as of: July 29, 2021  Content Version: 14.2   [address] [place].   Care instructions adapted under license by [facility]. If you have questions about a medical condition or this instruction, always ask your healthcare professional. [employer] disclaims any warranty or liability for your use of this information.           A Healthy Heart: Care Instructions  Overview     Coronary artery disease, also called heart disease, occurs when a substance called plaque builds up in the vessels that supply oxygen-rich blood to your heart muscle. This can narrow the blood vessels and reduce blood flow. A heart attack happens when blood flow is completely blocked. A high-fat diet, smoking, and other factors increase the risk of heart disease.  Your doctor has found that you have a chance of having heart disease. A heart-healthy lifestyle can help keep your heart healthy and prevent heart disease. This lifestyle includes eating healthy, being active, staying at a weight that's healthy for you, and not smoking or using tobacco. It also includes taking medicines as directed, managing other health  conditions, and trying to get a healthy amount of sleep.  Follow-up care is a key part of your treatment and safety. Be sure to make and go to all appointments, and call your doctor if you are having problems. It's also a good idea to know your test results and keep a list of the medicines you take.  How can you care for yourself at home?  Diet   Use less salt when you cook and eat. This helps lower your blood pressure. Taste food before salting. Add only a little salt when you think you need it. With time, your taste buds will adjust to less salt.    Eat fewer snack items, fast foods, canned soups, and other high-salt, high-fat, processed foods.    Read food  labels and try to avoid saturated and trans fats. They increase your risk of heart disease by raising cholesterol levels.    Limit the amount of solid fat--butter, margarine, and shortening--you eat. Use olive, peanut, or canola oil when you cook. Bake, broil, and steam foods instead of frying them.    Eat a variety of fruit and vegetables every day. Dark green, deep orange, red, or yellow fruits and vegetables are especially good for you. Examples include spinach, carrots, peaches, and berries.    Foods high in fiber can reduce your cholesterol and provide important vitamins and minerals. High-fiber foods include whole-grain cereals and breads, oatmeal, beans, brown rice, citrus fruits, and apples.    Eat lean proteins. Heart-healthy proteins include seafood, lean meats and poultry, eggs, beans, peas, nuts, seeds, and soy products.    Limit drinks and foods with added sugar. These include candy, desserts, and soda pop.   Heart-healthy lifestyle   If your doctor recommends it, get more exercise. For many people, walking is a good choice. Or you may want to swim, bike, or do other activities. Bit by bit, increase the time you're active every day. Try for at least 30 minutes on most days of the week.    Try to quit or cut back on using tobacco and other nicotine products. This includes smoking and vaping. If you need help quitting, talk to your doctor about stop-smoking programs and medicines. These can increase your chances of quitting for good. Quitting is one of the most important things you can do to protect your heart. It is never too late to quit. Try to avoid secondhand smoke too.    Stay at a weight that's healthy for you. Talk to your doctor if you need help losing weight.    Try to get 7 to 9 hours of sleep each night.    Limit alcohol to 2 drinks a day for men and 1 drink a day for women. Too much alcohol can cause health problems.    Manage other health problems such as diabetes, high blood  pressure, and high cholesterol. If you think you may have a problem with alcohol or drug use, talk to your doctor.   Medicines   Take your medicines exactly as prescribed. Call your doctor if you think you are having a problem with your medicine.    If your doctor recommends aspirin, take the amount directed each day. Make sure you take aspirin and not another kind of pain reliever, such as acetaminophen (Tylenol).   When should you call for help?   Call 911 if you have symptoms of a heart attack. These may include:   Chest pain or pressure, or a strange feeling  in the chest.    Sweating.    Shortness of breath.    Pain, pressure, or a strange feeling in the back, neck, jaw, or upper belly or in one or both shoulders or arms.    Lightheadedness or sudden weakness.    A fast or irregular heartbeat.   After you call 911, the operator may tell you to chew 1 adult-strength or 2 to 4 low-dose aspirin. Wait for an ambulance. Do not try to drive yourself.  Watch closely for changes in your health, and be sure to contact your doctor if you have any problems.  Where can you learn more?  Go to [employer] and enter F075 to learn more about "A Healthy Heart: Care Instructions."  Current as of: August 17, 2021  Content Version: 14.2   [address], [place].   Care instructions adapted under license by [facility]. If you have questions about a medical condition or this instruction, always ask your healthcare professional. [employer] disclaims any warranty or liability for your use of this information.      Personalized Preventive Plan for Cathy [name] - 12/30/2022  Medicare offers a range of preventive health benefits. Some of the tests and screenings are paid in full while other may be subject to a deductible, co-insurance, and/or copay.    Some of these benefits include a comprehensive review of your medical history including lifestyle, illnesses that may run in your  family, and various assessments and screenings as appropriate.    After reviewing your medical record and screening and assessments performed today your provider may have ordered immunizations, labs, imaging, and/or referrals for you.  A list of these orders (if applicable) as well as your Preventive Care list are included within your After Visit Summary for your review.    Other Preventive Recommendations:    A preventive eye exam performed by an eye specialist is recommended every 1-2 years to screen for glaucoma; cataracts, macular degeneration, and other eye disorders.  A preventive dental visit is recommended every 6 months.  Try to get at least 150 minutes of exercise per week or 10,000 steps per day on a pedometer .  Order or download the FREE "Exercise & Physical Activity: Your Everyday Guide" from [employer]. Call 530-065-9746 or search [employer] online.  You need 1200-1500 mg of calcium and 1000-2000 IU of vitamin D per day. It is possible to meet your calcium requirement with diet alone, but a vitamin D supplement is usually necessary to meet this goal.  When exposed to the sun, use a sunscreen that protects against both UVA and UVB radiation with an SPF of 30 or greater. Reapply every 2 to 3 hours or after sweating, drying off with a towel, or swimming.  Always wear a seat belt when traveling in a car. Always wear a helmet when riding a bicycle or motorcycle.

## 2022-12-30 NOTE — Progress Notes (Signed)
Cathy [name] presents today for   Chief Complaint   Patient presents with    Medicare AWV    Follow-up           "Have you been to the ER, urgent care clinic since your last visit?  Hospitalized since your last visit?"    NO    "Have you seen or consulted any other health care providers outside of [facility] since your last visit?"    YES - When: approximately 2 months ago.  Where and Why: [facility].

## 2022-12-30 NOTE — Progress Notes (Signed)
[facility]:  12/31/2022, MRN: [id number]      Cathy [name] is a 72 y.o. female and presents to clinic for Medicare AWV and Follow-up      Subjective:   The patient is a 72 year old female with history of hypertension, CHF, mitral regurgitation, prediabetes, cholelithiasis, asthma, multinodular goiter, obstructive sleep apnea (not on rx), allergic rhinitis, osteoporosis, uterine fibroids per EHR, insomnia, cataracts, dysphagia from achalasia?, LVAD placement in 2019, AICD placement, S/p heart transplant (2020, followed by [facility]), seizure, left distal radial artery occlusion (2020), status post removal of LLE schwannoma by Dr.Balsalmo, left Baker's cyst, Covid-19 (2021), HLD, and meningioma status post resection.     1. Prediabetes/Hyperglycemia: Worsened per recent lab work.     Latest Reference Range & Units 06/05/22 09:10 12/16/22 09:27   Hemoglobin A1C 4.2 - 5.6 % 5.9 (H) 6.2 (H)   (H): Data is abnormally high    2. CHF: October laboratory normal lipid panel.  Her electrolytes are unremarkable for significant abnormalities.  LFTs do not show any significant abnormalities.  She is on Lipitor 10 mg daily, Lasix 20 mg daily as needed, CellCept 250 mg twice daily, and tacrolimus 8 mg twice daily.  She has a history of a heart transplant and is followed by transplant surgery with routine lab work and studies.  Symptoms: she has intermittent SOB and CP. CP is retrosternal and induced by stress. No depression.     11/06/22 Echocardiogram: For the indication of heart transplant, findings are below. Left ventricular systolic function is normal with an ejection fraction of 60 % by visual estimation.  Left ventricular chamber size is normal. No concentric left ventricular hypertrophy. Left ventricular diastolic function: normal. Right ventricular chamber dimension is normal.  No hemodynamically significant valvular disease. No pulmonary hypertension, estimated pulmonary arterial systolic pressure  is 22 mmHg. Comparison: No significant change since prior study from 12/24/2021.     3.  CKD stage III: Stable per recent lab work.     Latest Reference Range & Units 12/16/22 09:27   WBC 4.6 - 13.2 K/uL 3.7 (L)   RBC 4.20 - 5.30 M/uL 3.56 (L)   Hemoglobin Quant 12.0 - 16.0 g/dL 21.3 (L)   (L): Data is abnormally low     Latest Reference Range & Units 12/16/22 09:27   Est, Glom Filt Rate >60 ml/min/1.34m2 49 (L)   (L): Data is abnormally low    4. Thyroid Nodule Hx: Overdue for imaging.    09/04/21 Thyroid Ultrasound: Both lobes show hypervascularity as before. Stable complex nodules in the left lobe. Slightly larger nodule in the isthmus. Still TR-3 and less than 1.5 cm. However, with interval growth, follow-up in one year recommended.      Patient Active Problem List    Diagnosis Date Noted    Heart transplant status (HCC) 05/14/2021    Abnormal MRI 04/30/2020    Mild persistent asthma without complication 08/30/2019    History of seizures 08/30/2019    Thyroid nodule 08/30/2019    Anemia 08/30/2019    Depression 08/30/2019    Esophageal dysphagia 03/17/2017    Multinodular goiter 03/04/2016    History of meningioma 03/04/2016    Age-related osteoporosis without current pathological fracture 03/04/2016    Mixed hyperlipidemia     Non-rheumatic mitral regurgitation 04/25/2014    AR (allergic rhinitis) 09/29/2011    AICD (automatic cardioverter/defibrillator) present 11/15/2010    Cataract 12/01/2008    Gallstones 11/13/2008    OSA (obstructive sleep apnea) 11/13/2008  Fibroid 06/23/2008    HTN (hypertension) 06/23/2008       Current Outpatient Medications   Medication Sig Dispense Refill    Calcium Carb-Cholecalciferol 500-5 MG-MCG TABS Take 1 tablet by mouth Daily      ascorbic acid (VITAMIN C) 1000 MG tablet Take 1 tablet by mouth daily      ROCKLATAN 0.02-0.005 % ophthalmic solution Place 1 drop into both eyes daily      atorvastatin (LIPITOR) 10 MG tablet daily      brimonidine (ALPHAGAN) 0.2 % ophthalmic  solution ceived the following from [facility]: Outside name: brimonidine (ALPHAGAN) 0.2 % ophthalmic solution      dorzolamide (TRUSOPT) 2 % ophthalmic solution ceived the following from [facility]: Outside name: dorzolamide (TRUSOPT) 2 % ophthalmic solution      furosemide (LASIX) 20 MG tablet Take 1 tablet by mouth daily as needed      mycophenolate (CELLCEPT) 500 MG tablet Take 0.5 tablets by mouth 2 times daily      omeprazole (PRILOSEC) 20 MG delayed release capsule ceived the following from [facility]: Outside name: omeprazole (PRILOSEC) 20 mg capsule      prednisoLONE acetate (PRED FORTE) 1 % ophthalmic suspension ceived the following from [facility]: Outside name: prednisoLONE acetate (PRED FORTE) 1 % ophthalmic suspension      tacrolimus (PROGRAF) 1 MG capsule 8 capsules 2 times daily       No current facility-administered medications for this visit.       Allergies   Allergen Reactions    Penicillamine Itching    Penicillins Itching and Rash     Itching bumps on hands and arms  Itching bumps on hands and arms  Itching bumps on hands and arms  Has patient had a PCN reaction causing immediate rash, facial/tongue/throat swelling, SOB or lightheadedness with hypotension: Yes  Has patient had a PCN reaction causing severe rash involving mucus membranes or skin necrosis: No  Has patient had a PCN reaction that required hospitalization unknown  Has patient had a PCN reaction occurring within the last 10 years: No  If all of the above answers are "NO", then may proceed with Cephalosporin use.    Itching bumps on hands and arms    Tramadol Other (See Comments)     Psychotic Hallucinations  Psychotic Hallucinations  hallucinations  Psychotic Hallucinations    Chlorhexidine Itching     CHG Wipes    Copper Itching    Nsaids Other (See Comments)       Past Medical History:   Diagnosis Date    Age related osteoporosis     Automatic implantable cardiac  defibrillator in situ     Post ddd icd Set up carelink    Baker cyst, left 12/2016    Breast mass, left     benign    CHF (congestive heart failure) (HCC) 06/23/2008    Non-ischemic CMP , Cath (2008) Normal coronary arteries    Chronic systolic heart failure (HCC)     Stable,     Degenerative arthritis of left knee     Fibroid 06/23/2008    Gastric intestinal metaplasia     History of brain tumor 2013    HLD (hyperlipidemia)     HTN (hypertension) 06/23/2008    Hypotension, unspecified     Related to lisinopril    Hypothyroid 06/23/2008    radioactive iodine ablation of thyroid  Left knee pain     With swelling    Leg pain     LVAD (left ventricular assist device) present (HCC) 03/2017    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     OSA (obstructive sleep apnea) 10/2008    PUD (peptic ulcer disease) 2018    small superficial gastric ulcer    Venous reflux     Wears glasses        Past Surgical History:   Procedure Laterality Date    BREAST BIOPSY      rt breast, benign    CARDIAC DEFIBRILLATOR PLACEMENT      CHOLECYSTECTOMY      CRANIOTOMY  2013    meningioma     CT GUIDED CHEST TUBE  01/13/2019    CT GUIDED CHEST TUBE 01/13/2019    CT NEEDLE BIOPSY LUNG PERCUTANEOUS  03/21/2019    CT NEEDLE BIOPSY LUNG PERCUTANEOUS 03/21/2019    ESOPHAGEAL MOTILITY STUDY  02/16/2017         HEENT      glasses    HYSTERECTOMY (CERVIX STATUS UNKNOWN)      HYSTERECTOMY, TOTAL ABDOMINAL (CERVIX REMOVED)  1996    PACEMAKER  2013    dual chamber icd-removed 2020    PR UNLISTED PROCEDURE CARDIAC SURGERY  11/05/2018    heart transplant    TONSILLECTOMY AND ADENOIDECTOMY      TUBAL LIGATION      XR MIDLINE EQUAL OR GREATER THAN 5 YEARS  02/09/2019    XR MIDLINE EQUAL OR GREATER THAN 5 YEARS 02/09/2019    XR MIDLINE EQUAL OR GREATER THAN 5 YEARS  04/16/2017    XR MIDLINE EQUAL OR GREATER THAN 5 YEARS 04/16/2017       Family History   Problem Relation Age of Onset    Diabetes Mother     Heart Attack Father         MI     Diabetes Daughter     Other Daughter         leukemia    Heart Disease Maternal Grandmother     Heart Disease Paternal Grandmother     Kidney Disease Other         1 sib deceased    Cancer Other         1 sib throat cancer    Diabetes Other     Hypertension Other     Stomach Cancer Maternal Uncle     Stomach Cancer Maternal Uncle        Social History     Tobacco Use    Smoking status: Never    Smokeless tobacco: Never   Substance Use Topics    Alcohol use: Not Currently     Alcohol/week: 20.0 standard drinks of alcohol       ROS   Review of Systems   Constitutional:  Negative for fever.   HENT:  Negative for ear pain and sore throat.    Eyes:  Negative for pain and visual disturbance.   Respiratory:  Negative for cough and shortness of breath.    Cardiovascular:  Negative for chest pain.   Gastrointestinal:  Negative for abdominal pain, anal bleeding and blood in stool.   Genitourinary:  Negative for dysuria and hematuria.   Musculoskeletal:  Negative for arthralgias and myalgias.   Skin:  Negative for rash.   Neurological:  Negative for headaches.   Hematological:  Does not bruise/bleed easily.   Psychiatric/Behavioral:  Negative for suicidal ideas.          Objective     BP (!) 100/59 (Site: Left Upper Arm, Position: Sitting, Cuff Size: Medium Adult)   Pulse 80   Temp 97.9 F (36.6 C) (Temporal)   Resp 17   Ht 1.778 m (5\' 10" )   Wt 67.9 kg (149 lb 12.8 oz)   SpO2 100%   BMI 21.49 kg/m      Physical Exam  Constitutional:       Appearance: Normal appearance.   HENT:      Head: Normocephalic and atraumatic.      Right Ear: External ear normal.      Left Ear: External ear normal.   Eyes:      General: No scleral icterus.        Right eye: No discharge.         Left eye: No discharge.      Conjunctiva/sclera: Conjunctivae normal.   Cardiovascular:      Rate and Rhythm: Normal rate and regular rhythm.      Pulses: Normal pulses.      Heart sounds: Normal heart sounds.   Pulmonary:      Effort: Pulmonary effort  is normal.      Breath sounds: Normal breath sounds.   Abdominal:      General: Abdomen is flat. Bowel sounds are normal. There is no distension.      Palpations: Abdomen is soft.      Tenderness: There is no abdominal tenderness.   Musculoskeletal:         General: No swelling (BUE) or tenderness (BUE).      Cervical back: Neck supple.   Lymphadenopathy:      Cervical: No cervical adenopathy.   Skin:     General: Skin is warm and dry.      Findings: No erythema.   Neurological:      Mental Status: She is alert. Mental status is at baseline.      Gait: Gait normal.   Psychiatric:         Mood and Affect: Mood normal.           LABS   Data Review:   Lab Results   Component Value Date/Time    WBC 3.7 12/16/2022 09:27 AM    HGBPOC 11.6 01/28/2018 02:24 PM    HGB 10.3 12/16/2022 09:27 AM    HCTPOC 34 01/28/2018 02:24 PM    HCT 34.5 12/16/2022 09:27 AM    PLT 163 12/16/2022 09:27 AM    MCV 96.9 12/16/2022 09:27 AM       Lab Results   Component Value Date/Time    NA 138 12/16/2022 09:27 AM    K 5.5 12/16/2022 09:27 AM    CL 109 12/16/2022 09:27 AM    CO2 23 12/16/2022 09:27 AM    BUN 26 12/16/2022 09:27 AM    GFRAA 26 08/03/2020 01:00 PM       Lab Results   Component Value Date/Time    CHOL 163 12/16/2022 09:27 AM    HDL 81 12/16/2022 09:27 AM    LDL 65.8 12/16/2022 09:27 AM    LDL 86 08/30/2019 01:00 PM    VLDL 16.2 12/16/2022 09:27 AM       No results found for: "HBA1C", "HBA1CPOC"    Assessment/Plan:   1. Chronic systolic (congestive) heart failure: Stable.  - C/w Lipitor 10 mg daily, Lasix 20 mg daily as needed, CellCept 250 mg  twice daily, and tacrolimus 8 mg twice daily  -She will continue to follow-up with her transplant team for routine checkups.    2. Nontoxic multinodular goiter: Overdue for imaging  -Ordering a thyroid ultrasound.    3. Stage 3a chronic kidney disease: Improved  -Checking a follow-up set of labs in 6 months.    4. Hyperglycemia: Worsening.  -I discussed briefly the option of adding  pharmacotherapy such as metformin.  Per our discussion we will hold off on this option.  She will reduce her carb intake and we will recheck her A1c in 6 months.          ICD-10-CM    1. Hyperglycemia  R73.9 Hemoglobin A1C      2. Need for vaccination  Z23 Influenza, FLUAD Trivalent, (age 43 y+), IM, Preservative Free, 0.31mL      3. Chronic systolic (congestive) heart failure (HCC)  I50.22 Comprehensive Metabolic Panel     CBC with Auto Differential      4. Nontoxic multinodular goiter  E04.2 [place] HEAD NECK SOFT TISSUE THYROID      5. Stage 3a chronic kidney disease (HCC)  N18.31 Comprehensive Metabolic Panel     CBC with Auto Differential      6. Medicare annual wellness visit, subsequent  Z00.00             Health Maintenance Due   Topic Date Due    DTaP/Tdap/Td vaccine (1 - Tdap) Never done    Shingles vaccine (1 of 2) Never done    Respiratory Syncytial Virus (RSV) Pregnant or age 22 yrs+ (1 - 1-dose 60+ series) Never done    COVID-19 Vaccine (3 - Pfizer risk series) 06/09/2019    Annual Wellness Visit (Medicare)  08/02/2022         Lab review: labs are reviewed in the EHR and ordered as mentioned above    I have discussed the diagnosis with the patient and the intended plan as seen in the above orders.  The patient has received an after-visit summary and questions were answered concerning future plans.  I have discussed medication side effects and warnings with the patient as well. I have reviewed the plan of care with the patient, accepted their input and they are in agreement with the treatment goals. All questions were answered. The patient understands the plan of care. Handouts provided today with above information. Pt instructed if symptoms worsen to call the office or report to the ED for continued care.  Greater than 50% of the visit time was spent in counseling and/or coordination of care.      Voice recognition was used to generate this report, which may have resulted in some phonetic based errors in  grammar and contents. Even though attempts were made to correct all the mistakes, some may have been missed, and remained in the body of the document.      No follow-up provider specified.    Darnelle Going, MD              Medicare Annual Wellness Visit    Cathy [name] is here for Medicare AWV and Follow-up    Assessment & Plan   Hyperglycemia  -     Hemoglobin A1C; Future  Need for vaccination  -     Influenza, FLUAD Trivalent, (age 2 y+), IM, Preservative Free, 0.58mL  Chronic systolic (congestive) heart failure (HCC)  -     Comprehensive Metabolic Panel; Future  -  CBC with Auto Differential; Future  Nontoxic multinodular goiter  -     [place] HEAD NECK SOFT TISSUE THYROID; Future  Stage 3a chronic kidney disease (HCC)  -     Comprehensive Metabolic Panel; Future  -     CBC with Auto Differential; Future  Medicare annual wellness visit, subsequent    Recommendations for Preventive Services Due: see orders and patient instructions/AVS.  Recommended screening schedule for the next 5-10 years is provided to the patient in written form: see Patient Instructions/AVS.     Return in about 6 months (around 06/29/2023).     Subjective     Patient's complete Health Risk Assessment and screening values have been reviewed and are found in Flowsheets. The following problems were reviewed today and where indicated follow up appointments were made and/or referrals ordered.    Health Maintenance:  - The flu shot was given today.   - I encouraged her to get all recommended vaccinations. She is overdue for the Tdap, shingles, RSV, and Covid shots per our records.   - No tobacco/drug/ETOH use  - No bleeding  - Advance care planning: no changes.  - She had intermittent diarrhea x 2 wks but sx resolved by today's apt.       Positive Risk Factor Screenings with Interventions:                Inactivity:  On average, how many days per week do you engage in moderate to strenuous exercise (like a brisk walk)?: 1 day (!) Abnormal  On  average, how many minutes do you engage in exercise at this level?: 10 min  Interventions:  See AVS for additional education material      Dentist Screen:  Have you seen the dentist within the past year?: (!) No    Intervention:  See AVS for additional education material     Vision Screen:  Do you have difficulty driving, watching TV, or doing any of your daily activities because of your eyesight?: (!) Yes  Have you had an eye exam within the past year?: Yes  Interventions:   See AVS for additional education material                  Objective      Patient-Reported Vitals  No data recorded             Allergies   Allergen Reactions    Penicillamine Itching    Penicillins Itching and Rash     Itching bumps on hands and arms  Itching bumps on hands and arms  Itching bumps on hands and arms  Has patient had a PCN reaction causing immediate rash, facial/tongue/throat swelling, SOB or lightheadedness with hypotension: Yes  Has patient had a PCN reaction causing severe rash involving mucus membranes or skin necrosis: No  Has patient had a PCN reaction that required hospitalization unknown  Has patient had a PCN reaction occurring within the last 10 years: No  If all of the above answers are "NO", then may proceed with Cephalosporin use.    Itching bumps on hands and arms    Tramadol Other (See Comments)     Psychotic Hallucinations  Psychotic Hallucinations  hallucinations  Psychotic Hallucinations    Chlorhexidine Itching     CHG Wipes    Copper Itching    Nsaids Other (See Comments)     Prior to Visit Medications    Medication Sig Taking? Authorizing Provider   Calcium Carb-Cholecalciferol 500-5  MG-MCG TABS Take 1 tablet by mouth Daily Yes [provider]   ascorbic acid (VITAMIN C) 1000 MG tablet Take 1 tablet by mouth daily Yes [provider]   ROCKLATAN 0.02-0.005 % ophthalmic solution Place 1 drop into both eyes daily Yes [provider]   atorvastatin (LIPITOR) 10 MG tablet daily Yes  Automatic Reconciliation, Ar   brimonidine (ALPHAGAN) 0.2 % ophthalmic solution ceived the following from [facility]: Outside name: brimonidine (ALPHAGAN) 0.2 % ophthalmic solution Yes Automatic Reconciliation, Ar   dorzolamide (TRUSOPT) 2 % ophthalmic solution ceived the following from [facility]: Outside name: dorzolamide (TRUSOPT) 2 % ophthalmic solution Yes Automatic Reconciliation, Ar   furosemide (LASIX) 20 MG tablet Take 1 tablet by mouth daily as needed Yes Automatic Reconciliation, Ar   mycophenolate (CELLCEPT) 500 MG tablet Take 0.5 tablets by mouth 2 times daily Yes Automatic Reconciliation, Ar   omeprazole (PRILOSEC) 20 MG delayed release capsule ceived the following from [facility]: Outside name: omeprazole (PRILOSEC) 20 mg capsule Yes Automatic Reconciliation, Ar   prednisoLONE acetate (PRED FORTE) 1 % ophthalmic suspension ceived the following from [facility]: Outside name: prednisoLONE acetate (PRED FORTE) 1 % ophthalmic suspension Yes Automatic Reconciliation, Ar   tacrolimus (PROGRAF) 1 MG capsule 8 capsules 2 times daily Yes Automatic Reconciliation, Ar       CareTeam (Including outside providers/suppliers regularly involved in providing care):   Patient Care Team:  Darnelle Going, MD as PCP - General  Marlene Bast, Chester Holstein, MD as PCP - Empaneled Provider      Reviewed and updated this visit:  Allergies  Meds  Problems

## 2022-12-30 NOTE — Progress Notes (Signed)
Jennife Zaucha December 17, 1950 female who presents for routine immunizations.   Patient denies any symptoms , reactions or allergies that would exclude them from being immunized today.  Risks and adverse reactions were discussed and the VIS was given to them. All questions were addressed.  Order placed for Fluad,  per Verbal Order from Dr. Tonye Becket, MD with read back.  There were no reactions observed.    Bernie Covey, LPN

## 2022-12-31 NOTE — ACP (Advance Care Planning) (Addendum)
Advance Care Planning     General Advance Care Planning (ACP) Conversation    Date of Conversation: 12/30/22    Conducted with: Patient with Decision Making Capacity    Healthcare Decision Maker:  Today we documented Decision Maker(s) consistent with ACP documents on file.    Content/Action Overview:  Has ACP document(s) on file - reflects the patient's care preferences    She has no updates to made to her pre-existing advance directive.  Her husband is her primary POA.    Length of Voluntary ACP Conversation in minutes:  <16 minutes (Non-Billable)    Darnelle Going, MD

## 2023-01-02 ENCOUNTER — Encounter

## 2023-02-10 NOTE — Progress Notes (Signed)
 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

## 2023-02-10 NOTE — Other (Signed)
 Labs reviewed    No change  Await remaining studies    [facility]

## 2023-02-10 NOTE — Progress Notes (Signed)
 Lab Only Encounter      02/10/2023, 9:59 AM      Cathy [name]  11/05/2018 (Heart)    Organ: Heart  Phase: Transplanted  Status: Active Follow-up  Reason:     Effective: 11/05/2018        Reason for labs: 3 month FUP       Active Patient Thresholds

## 2023-02-10 NOTE — Other (Signed)
 AlloMap/AlloSure reviewed    No change    [facility]

## 2023-02-12 ENCOUNTER — Inpatient Hospital Stay
Admit: 2023-02-12 | Disposition: A | Discharge status: Home / Self Care | Payer: MEDICARE | Source: Home / Self Care | Attending: Family Medicine | Admitting: Family Medicine | Primary: Family Medicine

## 2023-02-12 ENCOUNTER — Inpatient Hospital Stay
Admit: 2023-02-12 | Disposition: A | Discharge status: Home / Self Care | Payer: MEDICARE | Source: Ambulatory Visit | Attending: Family Medicine | Admitting: Family Medicine | Primary: Family Medicine

## 2023-02-12 VITALS — Ht 70.0 in | Wt 150.0 lb

## 2023-02-12 DIAGNOSIS — Z1231 Encounter for screening mammogram for malignant neoplasm of breast: Principal | ICD-10-CM

## 2023-02-12 DIAGNOSIS — E042 Nontoxic multinodular goiter: Principal | ICD-10-CM

## 2023-03-11 ENCOUNTER — Encounter

## 2023-03-12 NOTE — Telephone Encounter (Signed)
Ultrasound faxed to ENT and transplant team.

## 2023-03-12 NOTE — Telephone Encounter (Signed)
-----   Message from Dr. Vergie Living, MD sent at 03/11/2023  5:26 PM EST -----  I discussed her results with her.  Please fax her report to ENT.  I just referred her to ENT per my conversation with her today.  Please fax parathyroid ultrasound report to Dr.Herre and Dr.Old (her transplant team listed in her chart) per her request.    Dr. Darnelle Going  [facility]  [address] [place], [address]  [place], [place] [address]  Phone: [phone]  Fax: [phone]

## 2023-03-16 NOTE — Telephone Encounter (Signed)
Referral sent to ENT on 01/17 w/returned confirmation.    Karmen Stabs, LPN

## 2023-04-02 ENCOUNTER — Encounter

## 2023-04-21 ENCOUNTER — Inpatient Hospital Stay: Admit: 2023-04-21 | Payer: MEDICARE | Primary: Family Medicine

## 2023-04-21 DIAGNOSIS — E041 Nontoxic single thyroid nodule: Secondary | ICD-10-CM

## 2023-04-21 DIAGNOSIS — E042 Nontoxic multinodular goiter: Secondary | ICD-10-CM

## 2023-04-21 NOTE — H&P (Signed)
 OUTPATIENT HISTORY AND PHYSICAL    Today 04/21/2023     Indication/Symptoms:   Cathy [name] is a 73 y.o. female with imaging showing left thyroid nodules TR4 requiring further diagnostics who presents for an US-guided left thyroid nodule FNA biopsy.     Current Meds:    Prior to Admission medications    Medication Sig Start Date End Date Taking? Authorizing Provider   Calcium Carb-Cholecalciferol 500-5 MG-MCG TABS Take 1 tablet by mouth Daily    [provider]   ascorbic acid (VITAMIN C) 1000 MG tablet Take 1 tablet by mouth daily    [provider]   ROCKLATAN 0.02-0.005 % ophthalmic solution Place 1 drop into both eyes daily 05/07/22   [provider]   atorvastatin (LIPITOR) 10 MG tablet daily 10/20/20   Automatic Reconciliation, Ar   brimonidine (ALPHAGAN) 0.2 % ophthalmic solution ceived the following from [facility]: Outside name: brimonidine (ALPHAGAN) 0.2 % ophthalmic solution 05/08/20   Automatic Reconciliation, Ar   dorzolamide (TRUSOPT) 2 % ophthalmic solution ceived the following from [facility]: Outside name: dorzolamide (TRUSOPT) 2 % ophthalmic solution 04/02/20   Automatic Reconciliation, Ar   furosemide (LASIX) 20 MG tablet Take 1 tablet by mouth daily as needed 09/26/20   Automatic Reconciliation, Ar   mycophenolate (CELLCEPT) 500 MG tablet Take 0.5 tablets by mouth 2 times daily    Automatic Reconciliation, Ar   omeprazole (PRILOSEC) 20 MG delayed release capsule ceived the following from [facility]: Outside name: omeprazole (PRILOSEC) 20 mg capsule 06/20/20   Automatic Reconciliation, Ar   prednisoLONE acetate (PRED FORTE) 1 % ophthalmic suspension ceived the following from [facility]: Outside name: prednisoLONE acetate (PRED FORTE) 1 % ophthalmic suspension 04/30/20   Automatic Reconciliation, Ar   tacrolimus (PROGRAF) 1 MG capsule 8 capsules 2 times daily 08/15/19   Automatic Reconciliation, Ar        Allergies:      Allergies   Allergen Reactions    Penicillamine Itching    Penicillins Itching and Rash     Itching bumps on hands and arms  Itching bumps on hands and arms  Itching bumps on hands and arms  Has patient had a PCN reaction causing immediate rash, facial/tongue/throat swelling, SOB or lightheadedness with hypotension: Yes  Has patient had a PCN reaction causing severe rash involving mucus membranes or skin necrosis: No  Has patient had a PCN reaction that required hospitalization unknown  Has patient had a PCN reaction occurring within the last 10 years: No  If all of the above answers are "NO", then may proceed with Cephalosporin use.    Itching bumps on hands and arms    Tramadol Other (See Comments)     Psychotic Hallucinations  Psychotic Hallucinations  hallucinations  Psychotic Hallucinations    Chlorhexidine Itching     CHG Wipes    Copper Itching    Nsaids Other (See Comments)       Comorbid Conditions:    Past Medical History:   Diagnosis Date    Age related osteoporosis     Automatic implantable cardiac defibrillator in situ     Post ddd icd Set up [facility]    Baker cyst, left 12/2016    Breast mass, left     benign    CHF (congestive heart failure) (HCC) 06/23/2008    Non-ischemic CMP , Cath (2008) Normal coronary arteries    Chronic systolic heart  failure (HCC)     Stable,     Degenerative arthritis of left knee     Fibroid 06/23/2008    Gastric intestinal metaplasia     History of brain tumor 2013    HLD (hyperlipidemia)     HTN (hypertension) 06/23/2008    Hypotension, unspecified     Related to lisinopril    Hypothyroid 06/23/2008    radioactive iodine ablation of thyroid    Left knee pain     With swelling    Leg pain     LVAD (left ventricular assist device) present (HCC) 03/2017    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     OSA (obstructive sleep apnea) 10/2008    PUD (peptic ulcer disease) 2018    small superficial gastric ulcer    Venous reflux     Wears  glasses           Past Surgical History:   Procedure Laterality Date    BREAST BIOPSY      rt breast, benign    CARDIAC DEFIBRILLATOR PLACEMENT      CHOLECYSTECTOMY      CRANIOTOMY  2013    meningioma     CT GUIDED CHEST TUBE  01/13/2019    CT GUIDED CHEST TUBE 01/13/2019    CT NEEDLE BIOPSY LUNG PERCUTANEOUS W IMAGING GUIDANCE  03/21/2019    CT NEEDLE BIOPSY LUNG PERCUTANEOUS 03/21/2019    ESOPHAGEAL MOTILITY STUDY  02/16/2017         HEENT      glasses    HYSTERECTOMY (CERVIX STATUS UNKNOWN)      HYSTERECTOMY, TOTAL ABDOMINAL (CERVIX REMOVED)  1996    PACEMAKER  2013    dual chamber icd-removed 2020    PR UNLISTED PROCEDURE CARDIAC SURGERY  11/05/2018    heart transplant    TONSILLECTOMY AND ADENOIDECTOMY      TUBAL LIGATION      XR MIDLINE EQUAL OR GREATER THAN 5 YEARS  02/09/2019    XR MIDLINE EQUAL OR GREATER THAN 5 YEARS 02/09/2019    XR MIDLINE EQUAL OR GREATER THAN 5 YEARS  04/16/2017    XR MIDLINE EQUAL OR GREATER THAN 5 YEARS 04/16/2017     Data:    There were no vitals taken for this visit.:  No results for input(s): "PLT", "HCT" in the last 72 hours.  No results for input(s): "INR", "APTT" in the last 72 hours.    Invalid input(s): "PT"    The H & P and/or progress notes and any available imaging were reviewed.  The risks, indications and possible alternatives to the procedure, including doing nothing, were discussed and informed consent was obtained.    Physical Exam:      Mental status:   Alert and oriented.   Heart:   Regular rate.   Lungs:  Normal respiratory effort.    The patient is an appropriate candidate to undergo the planned procedure.     Doreene Nest Joevon Holliman, PA

## 2023-04-21 NOTE — Procedures (Signed)
 PROCEDURE NOTE  Date: 04/21/2023   Name: Cathy [name]  Date of Birth: Oct 31, 1950    Procedures        RADIOLOGY POST PROCEDURE NOTE     April 21, 2023       1:04 PM     Preoperative Diagnosis:   Left thyroid nodules    Postoperative Diagnosis:  Same.    Operator:  Doreene Nest Pascal Stiggers, PA under direct supervision    Assistant:  Dr. Chanda Busing present and actively participating for the entire procedure    Type of Anesthesia: 1% lidocaine locally    Procedure/Description:  Image-guided left thyroid nodule FNA biopsy x 2    Findings:   Successful left lateral lower pole thyroid nodule FNA biopsy called "midpole" on path / procedure reports  5 passes with a 25 gauge needle  3 passes with a 22 gauge spinal needle    Successful left posterior lower pole thyroid nodule FNA biopsy  3 passes with 25 gauge needle  1 pass with 22 gauge spinal needle    Pathologist present in the room to confirm specimen adequacy    Estimated blood Loss:  Minimal    Specimen Removed:  Yes    Blood transfusions:  None.    Implants:  None.    Complications: None    Condition: Stable    Blood thinning medications: OK to resume as clinically indicated    Discharge Plan:  discharge home    Doreene Nest Ellerie Arenz, PA

## 2023-06-23 ENCOUNTER — Encounter: Payer: MEDICARE | Primary: Family Medicine

## 2023-06-24 ENCOUNTER — Inpatient Hospital Stay: Admit: 2023-06-24 | Payer: MEDICARE | Primary: Family Medicine

## 2023-06-24 ENCOUNTER — Encounter: Admit: 2023-06-24 | Discharge: 2023-06-24 | Payer: MEDICARE | Primary: Family Medicine

## 2023-06-24 DIAGNOSIS — R739 Hyperglycemia, unspecified: Secondary | ICD-10-CM

## 2023-06-25 LAB — COMPREHENSIVE METABOLIC PANEL
ALT: 19 U/L (ref 10–35)
AST: 15 U/L (ref 10–38)
Albumin/Globulin Ratio: 1.5 (ref 0.8–1.7)
Albumin: 3.9 g/dL (ref 3.4–5.0)
Alk Phosphatase: 140 U/L — ABNORMAL HIGH (ref 45–117)
Anion Gap: 10 mmol/L (ref 3.0–18.0)
BUN/Creatinine Ratio: 22 — ABNORMAL HIGH (ref 12–20)
BUN: 28 mg/dL — ABNORMAL HIGH (ref 6–23)
CO2: 22 mmol/L (ref 21–32)
Calcium: 9.1 mg/dL (ref 8.5–10.1)
Chloride: 107 mmol/L (ref 98–107)
Creatinine: 1.25 mg/dL (ref 0.6–1.3)
Est, Glom Filt Rate: 46 mL/min/{1.73_m2} — ABNORMAL LOW (ref >60)
Globulin: 2.5 g/dL (ref 2.0–4.0)
Glucose: 141 mg/dL — ABNORMAL HIGH (ref 74–108)
Potassium: 5.5 mmol/L (ref 3.5–5.5)
Sodium: 140 mmol/L (ref 136–145)
Total Bilirubin: 0.3 mg/dL (ref 0.2–1.0)
Total Protein: 6.5 g/dL (ref 6.4–8.2)

## 2023-06-25 LAB — CBC WITH AUTO DIFFERENTIAL
Basophils %: 0.3 % (ref 0.0–2.0)
Basophils Absolute: 0.01 10*3/uL (ref 0.00–0.10)
Eosinophils %: 0.3 % (ref 0.0–5.0)
Eosinophils Absolute: 0.01 10*3/uL (ref 0.00–0.40)
Hematocrit: 34.9 % — ABNORMAL LOW (ref 35.0–45.0)
Hemoglobin: 10.2 g/dL — ABNORMAL LOW (ref 12.0–16.0)
Immature Granulocytes %: 0.3 % (ref 0.0–0.5)
Immature Granulocytes Absolute: 0.01 10*3/uL (ref 0.00–0.04)
Lymphocytes %: 22.4 % (ref 21.0–52.0)
Lymphocytes Absolute: 0.78 10*3/uL — ABNORMAL LOW (ref 0.90–3.60)
MCH: 28.7 pg (ref 24.0–34.0)
MCHC: 29.2 g/dL — ABNORMAL LOW (ref 31.0–37.0)
MCV: 98 FL (ref 78.0–100.0)
MPV: 11.7 FL (ref 9.2–11.8)
Monocytes %: 8.9 % (ref 3.0–10.0)
Monocytes Absolute: 0.31 10*3/uL (ref 0.05–1.20)
Neutrophils %: 67.8 % (ref 40.0–73.0)
Neutrophils Absolute: 2.36 10*3/uL (ref 1.80–8.00)
Nucleated RBCs: 0 /100{WBCs}
Platelets: 159 10*3/uL (ref 135–420)
RBC: 3.56 M/uL — ABNORMAL LOW (ref 4.20–5.30)
RDW: 14.3 % (ref 11.6–14.5)
WBC: 3.5 10*3/uL — ABNORMAL LOW (ref 4.6–13.2)
nRBC: 0 10*3/uL (ref 0.00–0.01)

## 2023-06-25 LAB — HEMOGLOBIN A1C
Estimated Avg Glucose: 134 mg/dL
Hemoglobin A1C: 6.3 % — ABNORMAL HIGH (ref 4.2–5.6)

## 2023-06-30 ENCOUNTER — Ambulatory Visit: Admit: 2023-06-30 | Discharge: 2023-06-30 | Payer: MEDICARE | Attending: Family Medicine | Primary: Family Medicine

## 2023-06-30 VITALS — BP 117/71 | HR 79 | Temp 98.40000°F | Resp 18 | Ht 70.0 in | Wt 151.0 lb

## 2023-06-30 DIAGNOSIS — N1831 Chronic kidney disease, stage 3a (HCC): Secondary | ICD-10-CM

## 2023-06-30 MED ORDER — EMPAGLIFLOZIN 10 MG PO TABS
10 | ORAL_TABLET | Freq: Every day | ORAL | 1 refills | 30.00000 days | Status: DC
Start: 2023-06-30 — End: 2024-01-01

## 2023-06-30 NOTE — Patient Instructions (Signed)
 Home Blood Pressure Test: About This Test  What is it?     A home blood pressure test allows you to keep track of your blood pressure at home. Blood pressure is a measure of the force of blood against the walls of your arteries. Blood pressure readings include two numbers, such as 130/80 (say "130 over 80"). The first number is the systolic pressure. The second number is the diastolic pressure.  Why is this test done?  You may do this test at home to:  Find out if you have high blood pressure.  Track your blood pressure if you have high blood pressure.  Track how well medicine is working to reduce high blood pressure.  Check how lifestyle changes, such as weight loss and exercise, are affecting blood pressure.  How do you prepare for the test?  For at least 30 minutes before you take your blood pressure, don't exercise, drink caffeine, or smoke. Empty your bladder before the test. Sit quietly with your back straight and both feet on the floor for at least 5 minutes. This helps you take your blood pressure while you feel comfortable and relaxed.  How is the test done?  If your doctor recommends it, take your blood pressure twice a day. Take it in the morning and evening.  Sit with your arm slightly bent and resting on a table so that your upper arm is at the same level as your heart.  Use the same arm each time you take your blood pressure.  Place the blood pressure cuff on the bare skin of your upper arm. You may have to roll up your sleeve, remove your arm from the sleeve, or take your shirt off.  Wrap the blood pressure cuff around your upper arm so that the lower edge of the cuff is about 1 inch above the bend of your elbow.  Do not move, talk, or text while you take your blood pressure.  Follow the instructions that came with your blood pressure monitor. They might be different from the following.  Press the on/off button on the automatic monitor. Then you may need to wait until the screen says the monitor  is ready.  Press the start button. The cuff will inflate and deflate by itself.  Your blood pressure numbers will appear on the screen.  Wait one minute and take your blood pressure again.  If your monitor does not automatically save your numbers, write them in your log book, along with the date and time.  Follow-up care is a key part of your treatment and safety. Be sure to make and go to all appointments, and call your doctor if you are having problems. It's also a good idea to keep a list of the medicines you take.  Where can you learn more?  Go to [employer] and enter C427 to learn more about "Home Blood Pressure Test: About This Test."  Current as of: October 31, 2020               Content Version: 13.5   2006-2022 [employer].   Care instructions adapted under license by [facility]. If you have questions about a medical condition or this instruction, always ask your healthcare professional. [employer] disclaims any warranty or liability for your use of this information.

## 2023-06-30 NOTE — Progress Notes (Signed)
 [facility]:  06/30/2023, MRN: [id number]      Cathy [name] is a 73 y.o. female and presents to clinic for Hyperglycemia (6 month follow up)      Subjective:   The patient is a 73 year old female with history of hypertension, CHF, mitral regurgitation, prediabetes, cholelithiasis, asthma, multinodular goiter, obstructive sleep apnea (not on rx), allergic rhinitis, osteoporosis, uterine fibroids per EHR, insomnia, cataracts, dysphagia from achalasia?, LVAD placement in 2019, AICD placement, S/p heart transplant (2020, followed by [facility]), seizure, left distal radial artery occlusion (2020), status post removal of LLE schwannoma by Dr.Balsalmo, left Baker's cyst, Covid-19 (2021), CKD stage 3, HLD, and meningioma status post resection.     1.  CHF: Transplant recipient.  Followed by her transplant team regularly.  Treated with lipitor 10mg  daily, Lasix 20 mg daily as needed, CellCept 200 mg twice daily, and tacrolimus 8 mg twice daily.  Asymptomatic today. She had DOE in March that she believes is 2/2 stress. She is trying to relieve her stress. She prays and this helps.     05/12/23 Echocardiogram: This is a limited study for ventricular function.  The patient has a heart transplant.  Left ventricular systolic function is normal with an ejection fraction of 57 % by Paschal's biplane.  Left ventricular chamber size is normal.  Right ventricular wall motion, systolic function, and chamber size are normal by visual assessment. There is no evidence of pulmonary hypertension.  The estimated pulmonary arterial systolic pressure is 19 mmHg.  There is no pericardial effusion. Comparison: No significant change since prior study from 11/06/18"     05/12/23 Cardiology Note: "Patient presenting with worsening symptoms of shortness of breath, chest pain.  Will obtain stat echocardiogram to evaluate underlying LV graft function, will order EKG to evaluate underlying rhythm and to evaluate for myocardial  injury or ischemia.  If there is drop in her LVEF, we will obtain urgent biopsy.  Will check heart care. I have personally interpreted and reviewed her ECG and noted normal sinus rhythm, stable T wave inversion in anterior lead with no changes from prior. Will check Heart care today. Check tacrolimus trough level, her goal is 4-8, pending level adjust dose accordingly.  Continue mycophenolate for immunosuppression.  Monitor closely for signs symptoms of infection or rejection. Blood pressure is mildly elevated today, she has not taken her medications as of yet.  Keep blood pressure log and bring it to clinic"    2.  Multinodular goiter history: At her last visit I ordered a thyroid ultrasound.  Status post a third biopsy in February.  Results are below. She was told to follow up with Dr.Martin in a year.     04/21/23 Thyroid Bx Pathology Report:   Fine Needle Aspiration, Left Middle Thyroid   Satisfactory for evaluation.   Consistent with a benign follicular nodule (includes adenomatoid nodule,   colloid, colloid nodule, etc.)   NO MALIGNANT CELLS FOUND.       Fine Needle Aspiration, Left Lower Thyroid   Satisfactory for evaluation.   Consistent with a benign follicular nodule (includes adenomatoid nodule,   colloid, colloid nodule, etc.)   NO MALIGNANT CELLS FOUND     04/21/23 Thyroid Ultrasound: Multiple complex left-sided thyroid nodules with some calcification as detailed above [in report]. Larger lesions reach threshold for biopsy recommendation. Smallest lesion requires follow-up and has minimally enlarged since 2023 exam. Solid left isthmus nodule appears slightly larger than previous exam, with size warranting sonographic follow-up as above. Subcentimeter  right thyroid nodules and a small cyst as above. Below size threshold for warranting follow-up but should be compared as 2 nodules appear new from the prior exam    3.  CKD stage III and Chronic Anemia: No urinary symptoms. No bleeding hx.     Latest  Reference Range & Units 12/16/22 09:27 06/24/23 13:46   WBC 4.6 - 13.2 K/uL 3.7 (L) 3.5 (L)   RBC 4.20 - 5.30 M/uL 3.56 (L) 3.56 (L)   Hemoglobin Quant 12.0 - 16.0 g/dL 04.5 (L) 40.9 (L)   Hematocrit 35.0 - 45.0 % 34.5 (L) 34.9 (L)   (L): Data is abnormally low     Latest Reference Range & Units 12/16/22 09:27 06/24/23 13:46   Est, Glom Filt Rate >60 ml/min/1.65m2 49 (L) 46 (L)   (L): Data is abnormally low    4.  Hyperglycemia/prediabetes: At her last visit, she opted versus medication.     Latest Reference Range & Units 06/05/22 09:10 12/16/22 09:27 06/24/23 13:46   Hemoglobin A1C 4.2 - 5.6 % 5.9 (H) 6.2 (H) 6.3 (H)   (H): Data is abnormally high    5.  Osteoporosis: Her alkaline phosphatase level is elevated for April lab work.  She is not on medication for osteoporosis.     Latest Reference Range & Units 12/16/22 09:27 06/24/23 13:46   Alkaline Phosphatase 45 - 117 U/L 118 (H) 140 (H)   (H): Data is abnormally high      Patient Active Problem List    Diagnosis Date Noted    Heart transplant status (HCC) 05/14/2021    Stage 3a chronic kidney disease (HCC) 08/26/2021    Abnormal MRI 04/30/2020    Mild persistent asthma without complication 08/30/2019    History of seizures 08/30/2019    Thyroid nodule 08/30/2019    Anemia 08/30/2019    Depression 08/30/2019    Esophageal dysphagia 03/17/2017    Multinodular goiter 03/04/2016    History of meningioma 03/04/2016    Age-related osteoporosis without current pathological fracture 03/04/2016    Mixed hyperlipidemia     Non-rheumatic mitral regurgitation 04/25/2014    AR (allergic rhinitis) 09/29/2011    AICD (automatic cardioverter/defibrillator) present 11/15/2010    Cataract 12/01/2008    Gallstones 11/13/2008    OSA (obstructive sleep apnea) 11/13/2008    Fibroid 06/23/2008    HTN (hypertension) 06/23/2008       Current Outpatient Medications   Medication Sig Dispense Refill    empagliflozin (JARDIANCE) 10 MG tablet Take 1 tablet by mouth daily 90 tablet 1    Calcium  Carb-Cholecalciferol 500-5 MG-MCG TABS Take 1 tablet by mouth Daily      ascorbic acid (VITAMIN C) 1000 MG tablet Take 1 tablet by mouth daily      ROCKLATAN 0.02-0.005 % ophthalmic solution Place 1 drop into both eyes daily      atorvastatin (LIPITOR) 10 MG tablet daily      brimonidine (ALPHAGAN) 0.2 % ophthalmic solution ceived the following from [facility]: Outside name: brimonidine (ALPHAGAN) 0.2 % ophthalmic solution      dorzolamide (TRUSOPT) 2 % ophthalmic solution ceived the following from [facility]: Outside name: dorzolamide (TRUSOPT) 2 % ophthalmic solution      furosemide (LASIX) 20 MG tablet Take 1 tablet by mouth daily as needed      mycophenolate (CELLCEPT) 500 MG tablet Take 0.5 tablets by mouth 2 times daily      omeprazole (PRILOSEC) 20 MG  delayed release capsule ceived the following from [facility]: Outside name: omeprazole (PRILOSEC) 20 mg capsule      prednisoLONE acetate (PRED FORTE) 1 % ophthalmic suspension ceived the following from [facility]: Outside name: prednisoLONE acetate (PRED FORTE) 1 % ophthalmic suspension      tacrolimus (PROGRAF) 1 MG capsule 8 capsules 2 times daily       No current facility-administered medications for this visit.       Allergies   Allergen Reactions    Penicillamine Itching    Penicillins Itching and Rash     Itching bumps on hands and arms  Itching bumps on hands and arms  Itching bumps on hands and arms  Has patient had a PCN reaction causing immediate rash, facial/tongue/throat swelling, SOB or lightheadedness with hypotension: Yes  Has patient had a PCN reaction causing severe rash involving mucus membranes or skin necrosis: No  Has patient had a PCN reaction that required hospitalization unknown  Has patient had a PCN reaction occurring within the last 10 years: No  If all of the above answers are "NO", then may proceed with Cephalosporin use.    Itching bumps on hands and arms     Tramadol Other (See Comments)     Psychotic Hallucinations  Psychotic Hallucinations  hallucinations  Psychotic Hallucinations    Chlorhexidine Itching     CHG Wipes    Copper Itching    Nsaids Other (See Comments)       Past Medical History:   Diagnosis Date    Age related osteoporosis     Automatic implantable cardiac defibrillator in situ     Post ddd icd Set up [facility]    Baker cyst, left 12/2016    Breast mass, left     benign    CHF (congestive heart failure) (HCC) 06/23/2008    Non-ischemic CMP , Cath (2008) Normal coronary arteries    Chronic systolic heart failure (HCC)     Stable,     Degenerative arthritis of left knee     Fibroid 06/23/2008    Gastric intestinal metaplasia     History of brain tumor 2013    HLD (hyperlipidemia)     HTN (hypertension) 06/23/2008    Hypotension, unspecified     Related to lisinopril    Hypothyroid 06/23/2008    radioactive iodine ablation of thyroid    Left knee pain     With swelling    Leg pain     LVAD (left ventricular assist device) present (HCC) 03/2017    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     OSA (obstructive sleep apnea) 10/2008    PUD (peptic ulcer disease) 2018    small superficial gastric ulcer    Venous reflux     Wears glasses        Past Surgical History:   Procedure Laterality Date    BREAST BIOPSY      rt breast, benign    CARDIAC DEFIBRILLATOR PLACEMENT      CHOLECYSTECTOMY      CRANIOTOMY  2013    meningioma     CT GUIDED CHEST TUBE  01/13/2019    CT GUIDED CHEST TUBE 01/13/2019    CT NEEDLE BIOPSY LUNG PERCUTANEOUS W IMAGING GUIDANCE  03/21/2019    CT NEEDLE BIOPSY LUNG PERCUTANEOUS 03/21/2019    ESOPHAGEAL MOTILITY STUDY  02/16/2017         HEENT  glasses    HYSTERECTOMY (CERVIX STATUS UNKNOWN)      HYSTERECTOMY, TOTAL ABDOMINAL (CERVIX REMOVED)  1996    PACEMAKER  2013    dual chamber icd-removed 2020    PR UNLISTED PROCEDURE CARDIAC SURGERY  11/05/2018    heart transplant    TONSILLECTOMY AND ADENOIDECTOMY      TUBAL  LIGATION      XR MIDLINE EQUAL OR GREATER THAN 5 YEARS  02/09/2019    XR MIDLINE EQUAL OR GREATER THAN 5 YEARS 02/09/2019    XR MIDLINE EQUAL OR GREATER THAN 5 YEARS  04/16/2017    XR MIDLINE EQUAL OR GREATER THAN 5 YEARS 04/16/2017       Family History   Problem Relation Age of Onset    Diabetes Mother     Heart Attack Father         MI    Diabetes Daughter     Other Daughter         leukemia    Heart Disease Maternal Grandmother     Heart Disease Paternal Grandmother     Kidney Disease Other         1 sib deceased    Cancer Other         1 sib throat cancer    Diabetes Other     Hypertension Other     Stomach Cancer Maternal Uncle     Stomach Cancer Maternal Uncle        Social History     Tobacco Use    Smoking status: Never    Smokeless tobacco: Never   Substance Use Topics    Alcohol use: Not Currently     Alcohol/week: 20.0 standard drinks of alcohol       ROS   Review of Systems   Constitutional:  Negative for fever.   HENT:  Negative for ear pain and sore throat.    Eyes:  Negative for pain.   Respiratory:  Negative for cough and shortness of breath.    Cardiovascular:  Negative for chest pain.   Gastrointestinal:  Negative for abdominal pain, anal bleeding and blood in stool.   Genitourinary:  Negative for dysuria and hematuria.   Musculoskeletal:  Negative for arthralgias and myalgias.   Skin:  Negative for rash.   Neurological:  Negative for headaches.   Hematological:  Does not bruise/bleed easily.   Psychiatric/Behavioral:  Negative for suicidal ideas.          Objective     BP 117/71 (BP Site: Right Upper Arm, Patient Position: Sitting, BP Cuff Size: Large Adult)   Pulse 79   Temp 98.4 F (36.9 C) (Temporal)   Resp 18   Ht 1.778 m (5\' 10" )   Wt 68.5 kg (151 lb)   SpO2 100%   BMI 21.67 kg/m      Physical Exam  Constitutional:       Appearance: Normal appearance.   HENT:      Head: Normocephalic and atraumatic.      Right Ear: External ear normal.      Left Ear: External ear normal.   Eyes:       General: No scleral icterus.        Right eye: No discharge.         Left eye: No discharge.      Conjunctiva/sclera: Conjunctivae normal.   Cardiovascular:      Rate and Rhythm: Normal rate and regular rhythm.      Pulses:  Normal pulses.      Heart sounds: Normal heart sounds.   Pulmonary:      Effort: Pulmonary effort is normal.      Breath sounds: Normal breath sounds.   Abdominal:      General: Abdomen is flat. Bowel sounds are normal. There is no distension.      Palpations: Abdomen is soft.      Tenderness: There is no abdominal tenderness.   Musculoskeletal:         General: No swelling (BUE) or tenderness (BUE).      Cervical back: Neck supple.   Lymphadenopathy:      Cervical: No cervical adenopathy.   Skin:     General: Skin is warm and dry.      Findings: No erythema.   Neurological:      Mental Status: She is alert. Mental status is at baseline.      Gait: Gait normal.   Psychiatric:         Mood and Affect: Mood normal.           LABS   Data Review:   Lab Results   Component Value Date/Time    WBC 3.5 06/24/2023 01:46 PM    HGBPOC 11.6 01/28/2018 02:24 PM    HGB 10.2 06/24/2023 01:46 PM    HCTPOC 34 01/28/2018 02:24 PM    HCT 34.9 06/24/2023 01:46 PM    PLT 159 06/24/2023 01:46 PM    MCV 98.0 06/24/2023 01:46 PM       Lab Results   Component Value Date/Time    NA 140 06/24/2023 01:46 PM    K 5.5 06/24/2023 01:46 PM    CL 107 06/24/2023 01:46 PM    CO2 22 06/24/2023 01:46 PM    BUN 28 06/24/2023 01:46 PM    GFRAA 26 08/03/2020 01:00 PM       Lab Results   Component Value Date/Time    CHOL 163 12/16/2022 09:27 AM    HDL 81 12/16/2022 09:27 AM    LDL 65.8 12/16/2022 09:27 AM    LDL 86 08/30/2019 01:00 PM    VLDL 16.2 12/16/2022 09:27 AM       No results found for: "HBA1C", "HBA1CPOC"    Assessment/Plan:   1. Heart transplant status and CHF: Stable.  - C/w lipitor 10mg  daily, Lasix 20 mg daily as needed, CellCept 200 mg twice daily, and tacrolimus 8 mg twice daily.  - I encouraged her to follow up with her  renal transplant team for long term rx recommendations.   - Adding jardiance 10mg  daily.     2. Stage 3a chronic kidney disease: Persistent.  - Checking labs in 2 wks and in 6 months.   - Ordering jardiance 10mg  daily    3. Nontoxic multinodular goiter: Stable.  - I encouraged her to follow up with ENT for long term surveillance.     4. Hyperglycemia: Worsened.   - Low carb diet encouraged.   - Checking labs in 6 months.  - Ordering jardiance 10mg  daily.     5. Abnormal CBC: Her anemia is stable. +Anemia of chronic disease.   - Checking a follow up CBC    6. Osteoporosis: Untreated. Her alk phosphatase level was elevated likely 2/2 this condition.   - She declines rx for osteoporosis at this time.          ICD-10-CM    1. Stage 3a chronic kidney disease (HCC)  N18.31 Comprehensive Metabolic Panel  empagliflozin (JARDIANCE) 10 MG tablet     Basic Metabolic Panel      2. Heart transplant status (HCC)  Z94.1       3. Chronic systolic (congestive) heart failure (HCC)  I50.22       4. Nontoxic multinodular goiter  E04.2       5. Hyperglycemia  R73.9 Comprehensive Metabolic Panel     Hemoglobin Z6X      6. Abnormal CBC  R79.89 CBC with Auto Differential            Health Maintenance Due   Topic Date Due    DTaP/Tdap/Td vaccine (1 - Tdap) Never done    Shingles vaccine (1 of 2) Never done    Respiratory Syncytial Virus (RSV) Pregnant or age 10 yrs+ (1 - Risk 60-74 years 1-dose series) Never done    COVID-19 Vaccine (3 - Pfizer risk series) 06/09/2019         Lab review: labs are reviewed in the EHR and ordered as mentioned above    I have discussed the diagnosis with the patient and the intended plan as seen in the above orders.  The patient has received an after-visit summary and questions were answered concerning future plans.  I have discussed medication side effects and warnings with the patient as well. I have reviewed the plan of care with the patient, accepted their input and they are in agreement with the  treatment goals. All questions were answered. The patient understands the plan of care. Handouts provided today with above information. Pt instructed if symptoms worsen to call the office or report to the ED for continued care.  Greater than 50% of the visit time was spent in counseling and/or coordination of care.      Voice recognition was used to generate this report, which may have resulted in some phonetic based errors in grammar and contents. Even though attempts were made to correct all the mistakes, some may have been missed, and remained in the body of the document.      No follow-up provider specified.    Mabeline Savant, MD

## 2023-06-30 NOTE — Progress Notes (Signed)
 Cathy [name] presents today for   Chief Complaint   Patient presents with    Hyperglycemia     6 month follow up           "Have you been to the ER, urgent care clinic since your last visit?  Hospitalized since your last visit?"    NO    "Have you seen or consulted any other health care providers outside of [facility] since your last visit?"    NO

## 2023-07-24 ENCOUNTER — Ambulatory Visit: Admit: 2023-07-24 | Discharge: 2023-07-24 | Payer: MEDICARE | Attending: Family Medicine | Primary: Family Medicine

## 2023-07-24 VITALS — Ht 70.0 in

## 2023-07-24 DIAGNOSIS — J22 Unspecified acute lower respiratory infection: Secondary | ICD-10-CM

## 2023-07-24 MED ORDER — DOXYCYCLINE HYCLATE 100 MG PO CAPS
100 | ORAL_CAPSULE | Freq: Two times a day (BID) | ORAL | 0 refills | 7.50000 days | Status: AC
Start: 2023-07-24 — End: 2023-08-03

## 2023-07-24 NOTE — Progress Notes (Unsigned)
 Cathy [name] presents today for   Chief Complaint   Patient presents with    Cough     Started 2 weeks ago along with loss of chills. Pain in her chest when she coughs. No fever    Congestion           "Have you been to the ER, urgent care clinic since your last visit?  Hospitalized since your last visit?"    NO    "Have you seen or consulted any other health care providers outside of [facility] since your last visit?"    NO

## 2023-07-24 NOTE — Progress Notes (Signed)
 [facility]:  07/30/2023, MRN: [id number]      Cathy [name] is a 73 y.o. female and presents to clinic for Cough (Started 2 weeks ago along with loss of chills. Pain in her chest when she coughs. No fever) and Congestion      Subjective:   The patient is a 73 year old female with history of hypertension, CHF, mitral regurgitation, prediabetes, cholelithiasis, asthma, multinodular goiter, obstructive sleep apnea (not on rx), allergic rhinitis, osteoporosis, uterine fibroids per EHR, insomnia, cataracts, dysphagia from achalasia?, LVAD placement in 2019, AICD placement, S/p heart transplant (2020, followed by [facility]), seizure, left distal radial artery occlusion (2020), status post removal of LLE schwannoma by Dr.Balsalmo, left Baker's cyst, Covid-19 (2021), CKD stage 3, HLD, and meningioma status post resection.     1. Cough/Respiratory Tract Infection: Present x 2 wks. No fever.  Sick contacts: none. Sputum: Yes; brown. SOB: "fine." Wheezing: none. CP: yes; pleuritic. Sore throat: none now but she had sx earlier in the course of her infection. Ear pain: none. Alleviating factors: mucinex and robitussin are not helping - her sx are worsening. On CellCept 200 mg twice daily, and tacrolimus 8 mg twice daily given her h/o heart transplantation.    2. CHF and CKD Stage 3:  She started jardiance  ordered at her last appointment to assist with CKD stage III. No issues with this rx.  In April, her estimated GFR was down to 46 from 49% last year.  She is a heart transplant recipient.  Followed closely by her transplant team.  Treated with Lasix 20 mg daily as needed and Lipitor 20 mg daily for CVD risk reduction.    05/12/23 Echocardiogram: This is a limited study for ventricular function.  The patient has a heart transplant.  Left ventricular systolic function is normal with an ejection fraction of 57 % by Mceachron's biplane.  Left ventricular chamber size is normal.  Right ventricular wall motion,  systolic function, and chamber size are normal by visual assessment. There is no evidence of pulmonary hypertension.  The estimated pulmonary arterial systolic pressure is 19 mmHg.  There is no pericardial effusion. Comparison: No significant change since prior study from 11/06/18"      05/12/23 Cardiology Note: "Patient presenting with worsening symptoms of shortness of breath, chest pain.  Will obtain stat echocardiogram to evaluate underlying LV graft function, will order EKG to evaluate underlying rhythm and to evaluate for myocardial injury or ischemia.  If there is drop in her LVEF, we will obtain urgent biopsy.  Will check heart care. I have personally interpreted and reviewed her ECG and noted normal sinus rhythm, stable T wave inversion in anterior lead with no changes from prior. Will check Heart care today. Check tacrolimus trough level, her goal is 4-8, pending level adjust dose accordingly.  Continue mycophenolate for immunosuppression.  Monitor closely for signs symptoms of infection or rejection. Blood pressure is mildly elevated today, she has not taken her medications as of yet.  Keep blood pressure log and bring it to clinic"    Patient Active Problem List    Diagnosis Date Noted    Heart transplant status (HCC) 05/14/2021    Stage 3a chronic kidney disease (HCC) 08/26/2021    Abnormal MRI 04/30/2020    Mild persistent asthma without complication 08/30/2019    History of seizures 08/30/2019    Thyroid nodule 08/30/2019    Anemia 08/30/2019    Depression 08/30/2019    Esophageal dysphagia 03/17/2017    Multinodular  goiter 03/04/2016    History of meningioma 03/04/2016    Age-related osteoporosis without current pathological fracture 03/04/2016    Mixed hyperlipidemia     Non-rheumatic mitral regurgitation 04/25/2014    AR (allergic rhinitis) 09/29/2011    AICD (automatic cardioverter/defibrillator) present 11/15/2010    Cataract 12/01/2008    Gallstones 11/13/2008    OSA (obstructive sleep apnea)  11/13/2008    Fibroid 06/23/2008    HTN (hypertension) 06/23/2008       Current Outpatient Medications   Medication Sig Dispense Refill    dextromethorphan-guaiFENesin (MUCINEX DM) 30-600 MG per extended release tablet Take 1 tablet by mouth every 12 hours as needed for Cough      dextromethorphan (DELSYM) 30 MG/5ML extended release liquid Take 10 mLs by mouth 2 times daily as needed for Cough      doxycycline hyclate (VIBRAMYCIN) 100 MG capsule Take 1 capsule by mouth 2 times daily for 10 days 20 capsule 0    empagliflozin  (JARDIANCE ) 10 MG tablet Take 1 tablet by mouth daily 90 tablet 1    Calcium Carb-Cholecalciferol 500-5 MG-MCG TABS Take 1 tablet by mouth Daily      ascorbic acid (VITAMIN C) 1000 MG tablet Take 1 tablet by mouth daily      ROCKLATAN 0.02-0.005 % ophthalmic solution Place 1 drop into both eyes daily      atorvastatin (LIPITOR) 10 MG tablet daily      brimonidine (ALPHAGAN) 0.2 % ophthalmic solution ceived the following from [facility]: Outside name: brimonidine (ALPHAGAN) 0.2 % ophthalmic solution      dorzolamide (TRUSOPT) 2 % ophthalmic solution ceived the following from [facility]: Outside name: dorzolamide (TRUSOPT) 2 % ophthalmic solution      furosemide (LASIX) 20 MG tablet Take 1 tablet by mouth daily as needed      mycophenolate (CELLCEPT) 500 MG tablet Take 0.5 tablets by mouth 2 times daily      omeprazole (PRILOSEC) 20 MG delayed release capsule ceived the following from [facility]: Outside name: omeprazole (PRILOSEC) 20 mg capsule      prednisoLONE acetate (PRED FORTE) 1 % ophthalmic suspension ceived the following from [facility]: Outside name: prednisoLONE acetate (PRED FORTE) 1 % ophthalmic suspension      tacrolimus (PROGRAF) 1 MG capsule 8 capsules 2 times daily       No current facility-administered medications for this visit.       Allergies   Allergen Reactions    Penicillamine Itching    Penicillins Itching  and Rash     Itching bumps on hands and arms  Itching bumps on hands and arms  Itching bumps on hands and arms  Has patient had a PCN reaction causing immediate rash, facial/tongue/throat swelling, SOB or lightheadedness with hypotension: Yes  Has patient had a PCN reaction causing severe rash involving mucus membranes or skin necrosis: No  Has patient had a PCN reaction that required hospitalization unknown  Has patient had a PCN reaction occurring within the last 10 years: No  If all of the above answers are "NO", then may proceed with Cephalosporin use.    Itching bumps on hands and arms    Tramadol Other (See Comments)     Psychotic Hallucinations  Psychotic Hallucinations  hallucinations  Psychotic Hallucinations    Chlorhexidine Itching     CHG Wipes    Copper Itching    Nsaids Other (See Comments)  Past Medical History:   Diagnosis Date    Age related osteoporosis     Automatic implantable cardiac defibrillator in situ     Post ddd icd Set up [facility]    Baker cyst, left 12/2016    Breast mass, left     benign    CHF (congestive heart failure) (HCC) 06/23/2008    Non-ischemic CMP , Cath (2008) Normal coronary arteries    Chronic systolic heart failure (HCC)     Stable,     Degenerative arthritis of left knee     Fibroid 06/23/2008    Gastric intestinal metaplasia     History of brain tumor 2013    HLD (hyperlipidemia)     HTN (hypertension) 06/23/2008    Hypotension, unspecified     Related to lisinopril    Hypothyroid 06/23/2008    radioactive iodine ablation of thyroid    Left knee pain     With swelling    Leg pain     LVAD (left ventricular assist device) present (HCC) 03/2017    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     OSA (obstructive sleep apnea) 10/2008    PUD (peptic ulcer disease) 2018    small superficial gastric ulcer    Venous reflux     Wears glasses        Past Surgical History:   Procedure Laterality Date    BREAST BIOPSY      rt breast, benign    CARDIAC  DEFIBRILLATOR PLACEMENT      CHOLECYSTECTOMY      CRANIOTOMY  2013    meningioma     CT GUIDED CHEST TUBE  01/13/2019    CT GUIDED CHEST TUBE 01/13/2019    CT NEEDLE BIOPSY LUNG PERCUTANEOUS W IMAGING GUIDANCE  03/21/2019    CT NEEDLE BIOPSY LUNG PERCUTANEOUS 03/21/2019    ESOPHAGEAL MOTILITY STUDY  02/16/2017         HEENT      glasses    HYSTERECTOMY (CERVIX STATUS UNKNOWN)      HYSTERECTOMY, TOTAL ABDOMINAL (CERVIX REMOVED)  1996    PACEMAKER  2013    dual chamber icd-removed 2020    PR UNLISTED PROCEDURE CARDIAC SURGERY  11/05/2018    heart transplant    TONSILLECTOMY AND ADENOIDECTOMY      TUBAL LIGATION      XR MIDLINE EQUAL OR GREATER THAN 5 YEARS  02/09/2019    XR MIDLINE EQUAL OR GREATER THAN 5 YEARS 02/09/2019    XR MIDLINE EQUAL OR GREATER THAN 5 YEARS  04/16/2017    XR MIDLINE EQUAL OR GREATER THAN 5 YEARS 04/16/2017       Family History   Problem Relation Age of Onset    Diabetes Mother     Heart Attack Father         MI    Diabetes Daughter     Other Daughter         leukemia    Heart Disease Maternal Grandmother     Heart Disease Paternal Grandmother     Kidney Disease Other         1 sib deceased    Cancer Other         1 sib throat cancer    Diabetes Other     Hypertension Other     Stomach Cancer Maternal Uncle     Stomach Cancer Maternal Uncle        Social History  Tobacco Use    Smoking status: Never    Smokeless tobacco: Never   Substance Use Topics    Alcohol use: Not Currently     Alcohol/week: 20.0 standard drinks of alcohol       ROS   Review of Systems   Constitutional:  Negative for fever.   HENT:  Negative for ear pain and sore throat.    Eyes:  Negative for pain.   Respiratory:  Positive for cough. Negative for wheezing.    Cardiovascular:  Negative for chest pain.   Gastrointestinal:  Negative for abdominal pain, anal bleeding and blood in stool.   Genitourinary:  Negative for dysuria and hematuria.   Musculoskeletal:  Negative for arthralgias and myalgias.   Skin:  Negative for rash.    Neurological:  Negative for headaches.   Hematological:  Does not bruise/bleed easily.   Psychiatric/Behavioral:  Negative for suicidal ideas.          Objective     Ht 1.778 m (5\' 10" )   BMI 21.67 kg/m      Physical Exam  Constitutional:       Appearance: Normal appearance.   HENT:      Head: Normocephalic and atraumatic.      Right Ear: Tympanic membrane and external ear normal.      Left Ear: Tympanic membrane and external ear normal.      Nose: Nose normal.      Mouth/Throat:      Pharynx: Oropharynx is clear.   Eyes:      General: No scleral icterus.        Right eye: No discharge.         Left eye: No discharge.      Conjunctiva/sclera: Conjunctivae normal.   Cardiovascular:      Rate and Rhythm: Normal rate and regular rhythm.      Pulses: Normal pulses.      Heart sounds: Normal heart sounds.   Pulmonary:      Effort: Pulmonary effort is normal.      Comments: RLL coarse breath sounds are present  Abdominal:      General: Abdomen is flat. Bowel sounds are normal. There is no distension.      Palpations: Abdomen is soft.      Tenderness: There is no abdominal tenderness.   Musculoskeletal:         General: No swelling (BUE) or tenderness (BUE).      Cervical back: Neck supple.   Lymphadenopathy:      Cervical: No cervical adenopathy.   Skin:     General: Skin is warm and dry.      Findings: No erythema.   Neurological:      Mental Status: She is alert. Mental status is at baseline.      Gait: Gait normal.   Psychiatric:         Mood and Affect: Mood normal.           LABS   Data Review:   Lab Results   Component Value Date/Time    WBC 3.5 06/24/2023 01:46 PM    HGBPOC 11.6 01/28/2018 02:24 PM    HGB 10.2 06/24/2023 01:46 PM    HCTPOC 34 01/28/2018 02:24 PM    HCT 34.9 06/24/2023 01:46 PM    PLT 159 06/24/2023 01:46 PM    MCV 98.0 06/24/2023 01:46 PM       Lab Results   Component Value Date/Time    NA 140  06/24/2023 01:46 PM    K 5.5 06/24/2023 01:46 PM    CL 107 06/24/2023 01:46 PM    CO2 22 06/24/2023  01:46 PM    BUN 28 06/24/2023 01:46 PM    GFRAA 26 08/03/2020 01:00 PM       Lab Results   Component Value Date/Time    CHOL 163 12/16/2022 09:27 AM    HDL 81 12/16/2022 09:27 AM    LDL 65.8 12/16/2022 09:27 AM    LDL 86 08/30/2019 01:00 PM    VLDL 16.2 12/16/2022 09:27 AM       No results found for: "HBA1C", "HBA1CPOC"    Assessment/Plan:   1. Lower respiratory tract infection: PE findings are consistent with a developing pneumonia. +Heart transplant recipient   -A chest x-ray was ordered  - Ordering doxycycline 100 mg twice daily for 10 days.  - I encouraged him from the if symptoms do not resolve.    2. CHF and Stage 3a chronic kidney disease and CHF: Stable.  -Continue with Lasix 20 mg daily as needed, Lipitor 10 mg daily, Jardiance  10 mg daily, CellCept 200 mg twice daily, and tacrolimus 8 mg twice daily.  - Checking follow-up BMP to assess for electrolytes after being on Jardiance .  -I encouraged her to follow-up with her transplant team which includes her cardiology team as well.        ICD-10-CM    1. Lower respiratory tract infection  J22 doxycycline hyclate (VIBRAMYCIN) 100 MG capsule     XR CHEST (2 VIEWS)      2. Stage 3a chronic kidney disease (HCC)  N18.31 Basic Metabolic Panel      3. Chronic systolic (congestive) heart failure (HCC)  I50.22       4. Heart transplant status (HCC)  Z94.1             Health Maintenance Due   Topic Date Due    DTaP/Tdap/Td vaccine (1 - Tdap) Never done    Shingles vaccine (1 of 2) Never done    Respiratory Syncytial Virus (RSV) Pregnant or age 33 yrs+ (1 - Risk 60-74 years 1-dose series) Never done    COVID-19 Vaccine (3 - Pfizer risk series) 06/09/2019         Lab review: labs are reviewed in the EHR     I have discussed the diagnosis with the patient and the intended plan as seen in the above orders.  The patient has received an after-visit summary and questions were answered concerning future plans.  I have discussed medication side effects and warnings with the  patient as well. I have reviewed the plan of care with the patient, accepted their input and they are in agreement with the treatment goals. All questions were answered. The patient understands the plan of care. Handouts provided today with above information. Pt instructed if symptoms worsen to call the office or report to the ED for continued care.  Greater than 50% of the visit time was spent in counseling and/or coordination of care.      Voice recognition was used to generate this report, which may have resulted in some phonetic based errors in grammar and contents. Even though attempts were made to correct all the mistakes, some may have been missed, and remained in the body of the document.      No follow-up provider specified.    Mabeline Savant, MD

## 2023-07-24 NOTE — Telephone Encounter (Signed)
 Apt scheduled today. Pt seen. See office note.      Dr. Mabeline Savant  [facility]  [address] [place], [address]  [place], [place] [address]  Phone: [phone]  Fax: [phone]

## 2023-08-07 ENCOUNTER — Encounter: Payer: MEDICARE | Primary: Family Medicine

## 2023-09-28 ENCOUNTER — Ambulatory Visit: Admit: 2023-09-28 | Discharge: 2023-09-28 | Payer: MEDICARE | Attending: Family Medicine | Primary: Family Medicine

## 2023-09-28 DIAGNOSIS — M542 Cervicalgia: Principal | ICD-10-CM

## 2023-09-28 MED ORDER — TIZANIDINE HCL 2 MG PO TABS
2 | ORAL_TABLET | Freq: Three times a day (TID) | ORAL | 3 refills | 30.00000 days | Status: AC | PRN
Start: 2023-09-28 — End: ?

## 2023-09-28 NOTE — Progress Notes (Signed)
 Cathy [name] presents today for   Chief Complaint   Patient presents with    Neck Pain     Clemens last Friday on her back. Taking tylenol. Hurts more to turn to her left.           Have you been to the ER, urgent care clinic since your last visit?  Hospitalized since your last visit?    NO    "Have you seen or consulted any other health care providers outside of [facility] since your last visit?"    NO

## 2023-09-28 NOTE — Progress Notes (Signed)
 [facility]:  09/28/2023, MRN: [id number]      Cathy [name] is a 73 y.o. female and presents to clinic for Neck Pain Amon last Friday on her back. Taking tylenol. Hurts more to turn to her left.)      Subjective:   The patient is a 73 year old female with history of hypertension, CHF, mitral regurgitation, prediabetes, cholelithiasis, asthma, multinodular goiter, obstructive sleep apnea (not on rx), allergic rhinitis, osteoporosis, uterine fibroids per EHR, insomnia, cataracts, dysphagia from achalasia?, LVAD placement in 2019, AICD placement, S/p heart transplant (2020, followed by [facility]), seizure, left distal radial artery occlusion (2020), status post removal of LLE schwannoma by Dr.Balsalmo, left Baker's cyst, Covid-19 (2021), CKD stage 3, HLD, and meningioma status post resection.      1. Neck Pain: Present: since last Friday. She moved over and accidentally fell on the floor, hitting her lower back and right knee pain. No LOC. The neck pain began the next day. Radiation of pain: none. Paresthesias/weakness: none. Alleviating factors: tylenol PM helps her sleep through the night. Triggers: turning her head to the right. Location of neck pain: right neck only. Her right knee pain resolved. No lower back pain or buttock.     2. CKD stage IIIa and CHF history: Treated with Lasix 20 mg daily as needed, Lipitor 10 mg daily, CellCept 200 mg twice daily, tacrolimus 8 mg twice daily, and Jardiance  10 mg daily (ordered at her last appointment).  She is followed by her heart transplant team and gets regular lab work.  After her last appointment, Jardiance  was ordered and she was expected to get lab work to assess her electrolytes and renal function.  She had labs from August 31, 2023 per review of the [facility] that showed that her creatinine was 1.1, down from 1.9 in June.  Her estimated GFR is up to 50.7.  Her electrolytes are unremarkable.  Her tacrolimus level was 4.2. Last month, she did  a 21 day Daniel fast.     She also has a history of anemia of chronic disease.  Her hemoglobin per August 07, 2023 labs show that her hemoglobin was 11.1.  MCV was normal.  This hemoglobin is actually better than her typical baseline.        Patient Active Problem List    Diagnosis Date Noted    Heart transplant status (HCC) 05/14/2021    Stage 3a chronic kidney disease (HCC) 08/26/2021    Abnormal MRI 04/30/2020    Mild persistent asthma without complication 08/30/2019    History of seizures 08/30/2019    Thyroid nodule 08/30/2019    Anemia 08/30/2019    Esophageal dysphagia 03/17/2017    Multinodular goiter 03/04/2016    History of meningioma 03/04/2016    Age-related osteoporosis without current pathological fracture 03/04/2016    Mixed hyperlipidemia     Non-rheumatic mitral regurgitation 04/25/2014    AR (allergic rhinitis) 09/29/2011    AICD (automatic cardioverter/defibrillator) present 11/15/2010    Cataract 12/01/2008    Gallstones 11/13/2008    OSA (obstructive sleep apnea) 11/13/2008    Fibroid 06/23/2008    HTN (hypertension) 06/23/2008       Current Outpatient Medications   Medication Sig Dispense Refill    tiZANidine  (ZANAFLEX ) 2 MG tablet Take 1 tablet by mouth every 8 hours as needed (right neck pain) 45 tablet 3    empagliflozin  (JARDIANCE ) 10 MG tablet Take 1 tablet by mouth daily 90 tablet 1    Calcium Carb-Cholecalciferol 500-5  MG-MCG TABS Take 1 tablet by mouth Daily      ascorbic acid (VITAMIN C) 1000 MG tablet Take 1 tablet by mouth daily      ROCKLATAN 0.02-0.005 % ophthalmic solution Place 1 drop into both eyes daily      atorvastatin (LIPITOR) 10 MG tablet daily      brimonidine (ALPHAGAN) 0.2 % ophthalmic solution ceived the following from [facility]: Outside name: brimonidine (ALPHAGAN) 0.2 % ophthalmic solution      dorzolamide (TRUSOPT) 2 % ophthalmic solution ceived the following from [facility]: Outside name: dorzolamide (TRUSOPT) 2 % ophthalmic  solution      mycophenolate (CELLCEPT) 500 MG tablet Take 0.5 tablets by mouth 2 times daily      omeprazole (PRILOSEC) 20 MG delayed release capsule ceived the following from [facility]: Outside name: omeprazole (PRILOSEC) 20 mg capsule      prednisoLONE acetate (PRED FORTE) 1 % ophthalmic suspension ceived the following from [facility]: Outside name: prednisoLONE acetate (PRED FORTE) 1 % ophthalmic suspension      tacrolimus (PROGRAF) 1 MG capsule Take 5 capsules by mouth 2 times daily      furosemide (LASIX) 20 MG tablet Take 1 tablet by mouth daily as needed       No current facility-administered medications for this visit.       Allergies   Allergen Reactions    Penicillamine Itching    Penicillins Itching and Rash     Itching bumps on hands and arms  Itching bumps on hands and arms  Itching bumps on hands and arms  Has patient had a PCN reaction causing immediate rash, facial/tongue/throat swelling, SOB or lightheadedness with hypotension: Yes  Has patient had a PCN reaction causing severe rash involving mucus membranes or skin necrosis: No  Has patient had a PCN reaction that required hospitalization unknown  Has patient had a PCN reaction occurring within the last 10 years: No  If all of the above answers are NO, then may proceed with Cephalosporin use.    Itching bumps on hands and arms    Tramadol Other (See Comments)     Psychotic Hallucinations  Psychotic Hallucinations  hallucinations  Psychotic Hallucinations    Chlorhexidine Itching     CHG Wipes    Copper Itching    Nsaids Other (See Comments)       Past Medical History:   Diagnosis Date    Age related osteoporosis     Automatic implantable cardiac defibrillator in situ     Post ddd icd Set up [facility]    Baker cyst, left 12/2016    Breast mass, left     benign    CHF (congestive heart failure) (HCC) 06/23/2008    Non-ischemic CMP , Cath (2008) Normal coronary arteries    Chronic systolic heart failure (HCC)      Stable,     Degenerative arthritis of left knee     Fibroid 06/23/2008    Gastric intestinal metaplasia     History of brain tumor 2013    HLD (hyperlipidemia)     HTN (hypertension) 06/23/2008    Hypotension, unspecified     Related to lisinopril    Hypothyroid 06/23/2008    radioactive iodine ablation of thyroid    Left knee pain     With swelling    Leg pain     LVAD (left ventricular assist device) present (HCC) 03/2017  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     OSA (obstructive sleep apnea) 10/2008    PUD (peptic ulcer disease) 2018    small superficial gastric ulcer    Venous reflux     Wears glasses        Past Surgical History:   Procedure Laterality Date    BREAST BIOPSY      rt breast, benign    CARDIAC DEFIBRILLATOR PLACEMENT      CHOLECYSTECTOMY      CRANIOTOMY  2013    meningioma     CT GUIDED CHEST TUBE  01/13/2019    CT GUIDED CHEST TUBE 01/13/2019    CT NEEDLE BIOPSY LUNG PERCUTANEOUS W IMAGING GUIDANCE  03/21/2019    CT NEEDLE BIOPSY LUNG PERCUTANEOUS 03/21/2019    ESOPHAGEAL MOTILITY STUDY  02/16/2017         HEENT      glasses    HYSTERECTOMY (CERVIX STATUS UNKNOWN)      HYSTERECTOMY, TOTAL ABDOMINAL (CERVIX REMOVED)  1996    PACEMAKER  2013    dual chamber icd-removed 2020    PR UNLISTED PROCEDURE CARDIAC SURGERY  11/05/2018    heart transplant    TONSILLECTOMY AND ADENOIDECTOMY      TUBAL LIGATION      XR MIDLINE EQUAL OR GREATER THAN 5 YEARS  02/09/2019    XR MIDLINE EQUAL OR GREATER THAN 5 YEARS 02/09/2019    XR MIDLINE EQUAL OR GREATER THAN 5 YEARS  04/16/2017    XR MIDLINE EQUAL OR GREATER THAN 5 YEARS 04/16/2017       Family History   Problem Relation Age of Onset    Diabetes Mother     Heart Attack Father         MI    Diabetes Daughter     Other Daughter         leukemia    Heart Disease Maternal Grandmother     Heart Disease Paternal Grandmother     Kidney Disease Other         1 sib deceased    Cancer Other         1 sib throat cancer    Diabetes Other      Hypertension Other     Stomach Cancer Maternal Uncle     Stomach Cancer Maternal Uncle        Social History     Tobacco Use    Smoking status: Never    Smokeless tobacco: Never   Substance Use Topics    Alcohol use: Not Currently     Alcohol/week: 20.0 standard drinks of alcohol       ROS   Review of Systems   Constitutional:  Negative for fever.   HENT:  Negative for ear pain and sore throat.    Eyes:  Negative for pain.   Respiratory:  Negative for shortness of breath and wheezing.    Cardiovascular:  Negative for chest pain.   Gastrointestinal:  Negative for abdominal pain, anal bleeding and blood in stool.   Genitourinary:  Negative for dysuria and hematuria.   Musculoskeletal:  Positive for neck pain.   Skin:  Negative for rash.   Neurological:  Negative for headaches.   Hematological:  Does not bruise/bleed easily.   Psychiatric/Behavioral:  Negative for suicidal ideas.          Objective     BP 113/63 (BP Site: Left Upper Arm, Patient Position: Sitting, BP  Cuff Size: Medium Adult)   Pulse 80   Temp 98.2 F (36.8 C) (Temporal)   Resp 18   Ht 1.778 m (5' 10)   Wt 66.2 kg (146 lb)   SpO2 96%   BMI 20.95 kg/m      Physical Exam  Constitutional:       Appearance: Normal appearance.   HENT:      Head: Normocephalic and atraumatic.      Right Ear: External ear normal.      Left Ear: External ear normal.   Eyes:      General: No scleral icterus.        Right eye: No discharge.         Left eye: No discharge.      Conjunctiva/sclera: Conjunctivae normal.   Cardiovascular:      Rate and Rhythm: Normal rate and regular rhythm.      Pulses: Normal pulses.      Heart sounds: Normal heart sounds.   Pulmonary:      Effort: Pulmonary effort is normal.      Breath sounds: Normal breath sounds.   Abdominal:      General: Abdomen is flat. Bowel sounds are normal. There is no distension.      Palpations: Abdomen is soft.      Tenderness: There is no abdominal tenderness.   Musculoskeletal:         General: No swelling  (BUE) or tenderness (BUE).      Cervical back: Neck supple.      Comments: She has adequate range of motion along her cervical spine.  Cervical spinous processes are nontender to palpation.  She has right-sided cervical paraspinal muscles are tender to palpation.  Her right sternocleidomastoid muscle is tender to palpation.  Her trapezius muscle on the right side is tender to palpation.   Lymphadenopathy:      Cervical: No cervical adenopathy.   Skin:     General: Skin is warm and dry.      Findings: No erythema.   Neurological:      Mental Status: She is alert. Mental status is at baseline.      Gait: Gait normal.   Psychiatric:         Mood and Affect: Mood normal.           LABS   Data Review:   Lab Results   Component Value Date/Time    WBC 3.5 06/24/2023 01:46 PM    HGBPOC 11.6 01/28/2018 02:24 PM    HGB 10.2 06/24/2023 01:46 PM    HCTPOC 34 01/28/2018 02:24 PM    HCT 34.9 06/24/2023 01:46 PM    PLT 159 06/24/2023 01:46 PM    MCV 98.0 06/24/2023 01:46 PM       Lab Results   Component Value Date/Time    NA 140 06/24/2023 01:46 PM    K 5.5 06/24/2023 01:46 PM    CL 107 06/24/2023 01:46 PM    CO2 22 06/24/2023 01:46 PM    BUN 28 06/24/2023 01:46 PM    GFRAA 26 08/03/2020 01:00 PM       Lab Results   Component Value Date/Time    CHOL 163 12/16/2022 09:27 AM    HDL 81 12/16/2022 09:27 AM    LDL 65.8 12/16/2022 09:27 AM    LDL 86 08/30/2019 01:00 PM    VLDL 16.2 12/16/2022 09:27 AM       No results found for: HBA1C, HBA1CPOC  Assessment/Plan:   1. Neck pain: Likely secondary to muscle spasm/strain per PE findings.  -Ordering Zanaflex  2 mg 3 times daily as needed.  - Stretching.  Warm compresses.  Massage.  - I encouraged her to notify me if she develops radicular symptoms.    2. Stage 3a chronic kidney disease: Improved.  -I encouraged her to maintain a heart healthy diet.  - Continue with Jardiance  10 mg daily.    3.  CHF: Stable.  - She will continue to see her heart transplant team.  - Continue with Lasix 20  mg daily as needed, Lipitor 10 mg daily, CellCept 200 mg twice daily, tacrolimus 8 mg twice daily, and Jardiance  10 mg daily          ICD-10-CM    1. Neck pain  M54.2 tiZANidine  (ZANAFLEX ) 2 MG tablet      2. Stage 3a chronic kidney disease (HCC)  N18.31       3. Chronic systolic (congestive) heart failure (HCC)  I50.22             Health Maintenance Due   Topic Date Due    DTaP/Tdap/Td vaccine (1 - Tdap) Never done    Shingles vaccine (1 of 2) Never done    Respiratory Syncytial Virus (RSV) Pregnant or age 96 yrs+ (1 - Risk 60-74 years 1-dose series) Never done    COVID-19 Vaccine (3 - Pfizer risk series) 06/09/2019    Flu vaccine (1) 09/25/2023         Lab review: labs are reviewed in the EHR    I have discussed the diagnosis with the patient and the intended plan as seen in the above orders.  The patient has received an after-visit summary and questions were answered concerning future plans.  I have discussed medication side effects and warnings with the patient as well. I have reviewed the plan of care with the patient, accepted their input and they are in agreement with the treatment goals. All questions were answered. The patient understands the plan of care. Handouts provided today with above information. Pt instructed if symptoms worsen to call the office or report to the ED for continued care.  Greater than 50% of the visit time was spent in counseling and/or coordination of care.      Voice recognition was used to generate this report, which may have resulted in some phonetic based errors in grammar and contents. Even though attempts were made to correct all the mistakes, some may have been missed, and remained in the body of the document.      No follow-up provider specified.    Harlene Arlean Rocks, MD

## 2023-10-23 MED ORDER — FLUTICASONE PROPIONATE 50 MCG/ACT NA SUSP
50 | Freq: Every day | NASAL | 5 refills | 60.00000 days | Status: AC
Start: 2023-10-23 — End: ?

## 2023-10-23 MED ORDER — LORATADINE 10 MG PO TABS
10 | ORAL_TABLET | Freq: Every day | ORAL | 3 refills | 30.00000 days | Status: AC
Start: 2023-10-23 — End: ?

## 2023-11-05 LAB — HEMOGLOBIN A1C
Estimated Avg Glucose, External: 122 mg/dL (ref 91–123)
Hemoglobin A1C, External: 5.9 % — ABNORMAL HIGH (ref 4.8–5.6)

## 2023-12-18 NOTE — H&P (Addendum)
 "         Cathy [name]    History & Physical      [name], 73 y.o. female   MRN [id number]   [facility]     (Note to patient:The purpose of this note is to communicate optimally with the other providers involved in your care. It is written using standard medical terminology. The Dragon dictation tool was used in preparation of this note, so please excuse typos/grammatical errors.  If you have questions regarding details of the note please call my office at [phone] and make an appointment to discuss your concerns.)    Outpatient Cardiologist: Comer Barren, MD ([facility])    Date of admission: 12/18/2023  Chief complaint: s/p OHT      ASSESSMENT/PLAN:     Cathy [name] is a 73 y.o. 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 She is now 5 years s/p OHT.    S/p OHT: As noted previously, last coronary angiography with IVUS from 2022 showed no angiographically significant disease and no significant IMT on IVUS.  Last TTE on 11/05/2023 showed preserved biventricular function with LVEF 57%, and no significant valvular disease.  She is now due for coronary angiography with IVUS for routine CAV surveillance since she is 5 years post-transplant.  She denies any concerning cardiovascular symptoms.  - Discussed with heart transplant team and will await formal stat BMP prior to proceeding with non-urgent LHC since iStat chem panel showed hyperkalemia to 5.9 and Cr up to 2, when her baseline has been Cr ~1 more recently. Only new medication is jardiance , which she says she started 2-3 weeks ago at the recommendation of her PCP for prediabetes. This may be contributing to her labs today.     Hyperkalemia; Possible AKI: IV access was difficult so it is possible that labs on istat chem panel were hemolyzed,  especially since patient is asymptomatic. However, ECG today does show T wave changes and we need to verify K+ levels and Cr before proceeding with cardiac cath; therefore, pending repeat stat repeat BMP as above.       UPDATE: Formal BMP from the lab shows K+ 5.3 and Cr 1.8, with eGFR 29 (when baseline is normal). I discussed all of this with Dr. Barren with [facility] today, who agrees that the patient does not need to go to the ER for admission at this time, but that the patient should STOP jardiance . She is also going to arrange for the patient to have repeat BMP in a week to recheck Cr and K+ at that time.   Will also cancel non-urgent cardiac catheterization for today given AKI. This can be re-scheduled if kidney function returns to normal vs pursue non-invasive testing.   All of this was discussed at length with the patient and her husband today, and they expressed understanding and were very appreciative.     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 Risks of bleeding, including that needing transfusion, infection, neuro-vascular injury or vascular closure complications/infections, vascular thrombosis, acute renal failure including that needing dialysis, medicine reaction, allergic reaction,  oversedation, heart attack, stroke, arrythmia, perforation, pneumothorax, restenosis, or need for emergency bypass surgery should angioplasty fail as well as death discussed in detail with the patient. All questions were answered. The patient verbalized understanding and consented to proceed.         HISTORY OF PRESENT ILLNESS:     Cathy [name] is a 73 y.o. 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 She is now 5 years s/p OHT.    She was last seen in the office on 11/05/2023.  She was able from a cardiac perspective at that time.  Today she presents with her husband.  She endorses compliance with her medications.  She denies any new cardiovascular concerns.  She specifically denies any chest pain or pressure, dyspnea on exertion, decreased exertional capacity as of late, sustained palpitations, dizziness, syncope, orthopnea, PND, or lower extremity edema.  She denies any bleeding issues.      PAST MEDICAL HISTORY:   The following medical history was reviewed.    Medical History:   Past Medical History:   Diagnosis Date    Acute respiratory failure with hypoxia (HCC) 11/11/2018    Acute systolic congestive heart failure (HCC) 06/29/2016    AKI (acute kidney injury) 11/11/2018    Requiring CRRT, then HD    Atrial tachycardia     Cardiogenic shock (HCC) 11/11/2018    Cardiomyopathy (HCC)     CHF (congestive heart failure), NYHA class IV, acute on chronic, diastolic (HCC) 11/06/2018    Chronic systolic congestive heart failure (HCC) 06/29/2016    Congestive heart failure (CHF) (HCC)     Constipation     Coronary artery disease     COVID-19 11/13/2019    per pt- mild but hospitalized due to hx of heart transplant    Encephalopathy 01/03/2019    Exercise tolerance finding 09/28/2020    denies cp/ sob with exertion    Fall 06/20/2017    High cholesterol      History of tracheostomy 2020    11/29/2018-04/13/2019    HTN (hypertension)     resolved    Hypothyroid     resolved    ICD (implantable cardioverter-defibrillator) in place     explanted 11/05/18    Injury of left radial artery     LVAD (left ventricular assist device) present [facility])     Mitral regurgitation     Nonischemic cardiomyopathy (HCC)     Overproduction of thyroid-stimulating hormone (TSH) 08/17/2006    Personal history of ECMO 01/03/2019    [facility]    Pneumonia due to infectious organism     s/p orthotopic heart transplant 12/17/2018 11/05/2018    Seizure (HCC)     x1-  around time of craniotomy    Sleep apnea     no cpap currently    [facility] de pointes [facility])         Surgical History:    Past Surgical History:   Procedure Laterality Date    BRONCHOSCOPY N/A 06/15/2020    Procedure: BRONCHOSCOPY;  Surgeon:  Carmelo Fonda HERO, MD    CARDIAC ASSIST VENTRICULAR DEVICE/ VAD IMPLANTABLE INSERTION N/A 04/06/2017    Procedure: INSERTION, LVAD, INTRACORPOREAL;  Surgeon: Josue Reggy Alma, MD    CARDIAC SURGERY N/A 04/06/2017    Procedure: REPAIR, TRICUSPID VALVE;  Surgeon: Josue Reggy Alma, MD    CARDIAC SURGERY N/A 04/13/2017    Procedure: SHIRLEE REMAK, IN IMMEDIATE OPEN HEART SURGERY POSTOPERATIVE PERIOD;  Surgeon: Nida Lonni PARAS, MD    CARDIAC SURGERY N/A 11/06/2018    Procedure: SHIRLEE REMAK, IN IMMEDIATE OPEN HEART SURGERY POSTOPERATIVE PERIOD;  Surgeon: Efraim Dorn HERO, MD    CARDIAC SURGERY N/A 11/06/2018    Procedure: SHIRLEE REMAK, IN IMMEDIATE OPEN HEART SURGERY POSTOPERATIVE PERIOD;  Surgeon: Efraim Dorn HERO, MD    CARDIAC SURGERY N/A 11/10/2018    Procedure: SHIRLEE REMAK, IN IMMEDIATE OPEN HEART SURGERY POSTOPERATIVE PERIOD;  Surgeon: Magda Camellia NOVAK, MD    CARDIOVERTER-DEFIBRILLATOR/ ICD LEAD/S REPLACEMENT Left 10/07/2018    Gen change by Dr Bernadine    COLONOSCOPY N/A 07/16/2017    Procedure: COLONOSCOPY;  Surgeon:  Reda Earleen Gearing, MD    CRANIOTOMY      benign tumor excised    EGD WITH PEG TUBE INSERTION N/A 12/02/2018    Procedure: EGD, WITH PERCUTANEOUS GASTROSTOMY TUBE INSERTION;  Surgeon: Carvin Harlene SAUNDERS, MD    EXCISION NEUROFIBROMA/ NEUROLEMMOMMA Left 10/01/2020    Procedure: EXCISION, NEUROFIBROMA;  Surgeon: Reita Primes, MD    HEART TRANSPLANT N/A 11/05/2018    Procedure: TRANSPLANT, HEART;  Surgeon: Magda Camellia NOVAK, MD    HYSTERECTOMY      INCISION & DRAINAGE COMPLEX POSTOPERATIVE WOUND INFECTION N/A 12/17/2018    Procedure: INCISION AND DRAINAGE COMPLEX POSTOPERATIVE WOUND INFECTION TRUNK;  Surgeon: Magda Camellia NOVAK, MD    STERNUM CLOSURE N/A 11/14/2018    Procedure: CLOSURE, MEDIAN STERNOTOMY, WITH STERNAL DEBRIDEMENT;  Surgeon: Nida Lonni PARAS, MD    TRACHEOSTOMY N/A 11/29/2018    Procedure: TRACHEOSTOMY;  Surgeon: Oneil Dorn SAUNDERS, MD    TRANSESOPHAGEAL ECHOCARDIOGRAPHY/ TEE N/A 04/06/2017    Procedure: ECHOCARDIOGRAM, TRANSESOPHAGEAL;  Surgeon: Josue Reggy Alma, MD    TRANSESOPHAGEAL ECHOCARDIOGRAPHY/ TEE N/A 11/06/2018    Procedure: ECHOCARDIOGRAM, TRANSESOPHAGEAL;  Surgeon: Efraim Dorn HERO, MD    TRANSESOPHAGEAL ECHOCARDIOGRAPHY/ TEE N/A 11/06/2018    Procedure: ECHOCARDIOGRAM, TRANSESOPHAGEAL;  Surgeon: Efraim Dorn HERO, MD    TRANSESOPHAGEAL ECHOCARDIOGRAPHY/ TEE N/A 11/05/2018    Procedure: ECHOCARDIOGRAM, TRANSESOPHAGEAL;  Surgeon: Magda Camellia NOVAK, MD    TRANSESOPHAGEAL ECHOCARDIOGRAPHY/ TEE N/A 11/10/2018    Procedure: ECHOCARDIOGRAM, TRANSESOPHAGEAL;  Surgeon: Magda Camellia NOVAK, MD    TRANSESOPHAGEAL ECHOCARDIOGRAPHY/ TEE N/A 11/14/2018    Procedure: ECHOCARDIOGRAM, TRANSESOPHAGEAL;  Surgeon: Nida Lonni PARAS, MD    VENOUS ACCESS CATHETER INSERTION Left 11/29/2018    Procedure: INSERTION, CATHETER, TUNNELED, FOR DIALYSIS;  Surgeon: Alfornia Peck, MD    VENOUS ACCESS CATHETER INSERTION N/A 01/07/2019    Procedure: TCVL placement;  Surgeon: Cristine Cy HERO, MD    VENOUS  ACCESS CATHETER INSERTION Left 02/07/2019    Procedure: Tunnel Dialysis Catheter Insertion;  Surgeon: Alfornia Peck, MD        Social History:    Social History     Socioeconomic History    Marital status: Married     Spouse name: Not on file    Number of children: Not on file    Years of education: Not on file    Highest education level: Not on file   Occupational History    Not on file  Tobacco Use    Smoking status: Former     Types: Cigarettes    Smokeless tobacco: Never    Tobacco comments:     quit at age 63   Vaping Use    Vaping status: Never Used   Substance and Sexual Activity    Alcohol use: No    Drug use: No    Sexual activity: Not on file   Other Topics Concern    Not on file   Social History Narrative    Not on file     Social Drivers of Health     Financial Resource Strain: Low Risk  (12/29/2022)    Received from [facility]    Overall Financial Resource Strain (CARDIA)     Difficulty of Paying Living Expenses: Not very hard   Food Insecurity: No Food Insecurity (07/24/2023)    Received from [facility]    Hunger Vital Sign     Worried About Running Out of Food in the Last Year: Never true     Ran Out of Food in the Last Year: Never true   Transportation Needs: No Transportation Needs (07/24/2023)    Received from [facility]    PRAPARE - Therapist, Art (Medical): No     Lack of Transportation (Non-Medical): No   Physical Activity: Insufficiently Active (12/29/2022)    Received from [facility]    Exercise Vital Sign     Days of Exercise per Week: 1 day     Minutes of Exercise per Session: 10 min   Stress: Not on file   Social Connections: Not on file   Intimate Partner Violence: Not on file   Housing Stability: Low Risk  (07/24/2023)    Received from [facility]    Housing Stability Vital Sign     Unable to Pay for Housing in the Last Year: No      Number of Times Moved in the Last Year: 0     Homeless in the Last Year: No       Family History:    Family History   Problem Relation Age of Onset    Cancer Brother         throat    Diabetes Mother     Heart Failure Mother     Heart Failure Father     Diabetes Father     Diabetes Sister        Allergies:   Allergies   Allergen Reactions    Tramadol unknown and neurological reaction     Psychotic Hallucinations    Chlorhexidine rash/itching    Chlorhexidine Towelette rash/itching     CHG Wipes      Copper rash/itching     Copper sheets/ gown    Penicillamine rash/itching    Nsaids (Non-Steroidal Anti-Inflammatory Drug) other/intolerance    Penicillin rash/itching    Penicillins rash/itching     Itching bumps on hands and arms       Home Medications:  Home Medication List - Marked as Reviewed on 12/18/23 1353   Medication Sig   aspirin 81 mg PO CHEW Take 1 Tab by Mouth Once a Day.   atorvastatin (LIPITOR) 10 mg PO TABS Take 1 Tab by Mouth Every Night at Bedtime.   brimonidine (ALPHAGAN) 0.2 % OP Drop Instill 1 Drop in both eyes Every 8 Hours.   cholecalciferol (VITAMIN  D3) 25 mcg (1,000 unit) PO TABS Take 1 Tab by Mouth Once a Day.   dorzolamide (TRUSOPT) 2 % OP Drop Instill 1 Drop in both eyes 3 Times Daily.   empagliflozin  (JARDIANCE ) 10 mg PO TABS Take 1 Tab by Mouth Once a Day.   furosemide (LASIX) 20 mg PO TABS Take 1 Tab by Mouth Take As Needed. Take one tab by mouth daily as needed for leg swelling or weight gain.   latanoprost (XALATAN) 0.005 % OP Drop Instill 1 Drop in both eyes Every Night at Bedtime.   mycophenolate (CELLCEPT) 250 mg PO CAPS Take 1 Cap by Mouth Twice Daily.   omeprazole (PRILOSEC) 20 mg PO CPDR Take 1 Cap by Mouth Every Morning Before Breakfast.   tacrolimus (PROGRAF) 5 mg PO CAPS Take 1 Cap by Mouth Every 12 hours.       Current Medications:     NS   Followed by  NS          REVIEW OF SYSTEMS:      See HPI.       PHYSICAL EXAM:      BP 120/69   Temp 98.8 F (37.1 C)    Resp 20   Ht 5' 9 (1.753 m)   Wt 61.6 kg (135 lb 12.9 oz)   SpO2 100%   BMI 20.05 kg/m   Body mass index is 20.05 kg/m.    Constitutional: Appears stated age; well nourished; in no acute distress.  Neck: JVP is normal.   Cardiovascular: regular rate and rhythm; normal S1/S2 with no murmurs/rubs/gallops appreciated.  Pulmonary: Clear to auscultation bilaterally; no wheezes/rales/rhonchi appreciated; normal work of breathing on room air.  Extremities: No lower extremity edema bilaterally.  Neurologic: Alert and oriented x3. Grossly non-focal motor exam.  Skin: Warm and well perfused.       LAB STUDIES:     Complete Blood Count:  Lab Results   Component Value Date/Time    WBC 3.1 (L) 12/18/2023 02:01 PM    RBC 3.79 (L) 12/18/2023 02:01 PM    HEMOGLOBIN 10.3 (L) 12/18/2023 02:01 PM    HCT 33.3 (L) 12/18/2023 02:01 PM    MCV 88 12/18/2023 02:01 PM    MCH 27 12/18/2023 02:01 PM    MCHC 31 12/18/2023 02:01 PM    RDW 14.2 12/18/2023 02:01 PM    PLATELET 169 12/18/2023 02:01 PM    MPV 10.9 12/18/2023 02:01 PM    BANDS 1 11/06/2022 09:40 AM    BANDS 1 04/22/2019 09:05 AM    SEGS 71 12/18/2023 02:01 PM    SEGS 36 (L) 11/06/2022 09:40 AM    SEGS 45 02/03/2022 08:29 AM    LYMPHOCYTES 19 (L) 12/18/2023 02:01 PM    LYMPHOCYTES 36 11/06/2022 09:40 AM    LYMPHOCYTES 39 02/03/2022 08:29 AM    MONOS 9 12/18/2023 02:01 PM    MONOS 17 (H) 11/06/2022 09:40 AM    MONOS 9 02/03/2022 08:29 AM    EOS 1 12/18/2023 02:01 PM    EOS 1 11/06/2022 09:40 AM    EOS 2 02/03/2022 08:29 AM    BASOS 0 12/18/2023 02:01 PM    BASOS 3 (H) 11/06/2022 09:40 AM    BASOS 1 02/03/2022 08:29 AM       Basic Metabolic Panel:  Lab Results   Component Value Date    NA 139 11/05/2023    POTASSIUM 4.8 11/05/2023    CHLORIDE 104 11/05/2023    CO2 23 11/05/2023  BUN 23 (H) 11/05/2023    CREAT 1.1 11/05/2023    GLUCOSE 88 11/05/2023    CALCIUM 8.9 11/05/2023    MAGNESIUM 1.8 11/05/2023    PHOSPHORUS 3.8 06/20/2020       Hepatic Function:  Lab Results    Component Value Date    ALBUMIN 4.1 11/05/2023    TOTPR 6.1 (L) 11/05/2023    BILID <0.2 11/05/2023    BILIT 0.3 11/05/2023    SGPTALT 12 11/05/2023    SGOTAST 12 11/05/2023    ALKPHOS 124 (H) 11/05/2023    AMYLASE 98 11/15/2020    LIPASE 56 11/15/2020       Lipid Panel:  Lab Results   Component Value Date/Time    CHOLESTEROL 163 11/05/2023 09:32 AM    HDL 63 11/05/2023 09:32 AM    LDLIPO 90 11/05/2023 09:32 AM    TRIGLYCERIDE 49 11/05/2023 09:32 AM       Cardiac Enzymes:  No results for input(s): CPK, CPKMB, CKMBPCT, TROPQUANT in the last 72 hours.  Lab Results   Component Value Date    BNP 604 (H) 07/20/2020         CARDIAC DIAGNOSTIC STUDIES:     12/18/23 EKG independently reviewed: Sinus rhythm with anterior TWI.    Echocardiogram:  Results for orders placed or performed during the hospital encounter of 11/05/23   ECHOCARDIOGRAM COMPLETE   Result Value Ref Range    EF Echo 57     Narrative    CONCLUSIONS    * For the indication of s/p heart transplant, findings are preserved graft function.    * Left ventricular systolic function is normal with an ejection fraction of 57 % by Ramires's biplane.    * Left ventricular chamber size is decreased.    * Mild concentric left ventricular hypertrophy.    * Left ventricular diastolic function: indeterminate.    * Right ventricular systolic function is normal.    * Right ventricular chamber dimension is normal.    * There is no aortic valve stenosis.    * The aortic root at the sinus of Valsalva is normal measuring 2.73 cm with an index of 1.5.    Comparison    * Compared to prior study from 05/12/2023.    * Ejection fraction 57%.    * Pulmonary pressure of .      Patient Info  Name:     Cathy [name]  Age:     33 years  DOB:     Dec 11, 1950  Gender:     Female  MRN:     [id number]  Ht:     69 in  Wt:     145 lb  BSA:     1.79 m2  HR:     88 bpm  BP:     147  /     66 mmHg  Exam Date:     11/05/2023 10:38 AM  Patient Status:     OP    Exam Type:     ECHO  CARDIOGRAM COMPLETE    Indications      Heart transplant,  orthotopic,  status (HCC) -      Left Ventricle    Left ventricular systolic function is normal with an ejection fraction of 57 % by Bungert's biplane. Left ventricular chamber size is decreased. Mild concentric left ventricular hypertrophy. Left ventricular diastolic function: indeterminate.    Right Ventricle    Tricuspid annular plane systolic excursion (TAPSE)  is reduced, 1.0 cm. Right ventricular systolic function is normal. Right ventricular chamber dimension is normal.      Left Atrium    Left atrial chamber is normal with a left atrial volume index biplane method of disk (BP MOD) of 48 ml/m^2.    Right Atrium    Right atrial chamber size is normal.      Aortic Valve    The aortic valve is tricuspid. There is no aortic valve stenosis. There is trace aortic valve regurgitation.    Pulmonic Valve    The pulmonic valve is not well visualized. There is no pulmonic valve stenosis. There is no pulmonic regurgitation.    Mitral Valve    The mitral valve has normal leaflets. There is no mitral valve stenosis. There is trace mitral valve regurgitation.    Tricuspid Valve    The tricuspid valve leaflets are not well visualized. There is no tricuspid valve stenosis. There is no tricuspid valve regurgitation. Unable to estimate pulmonary arterial pressure due to inadequate tricuspid regurgitant Doppler waveforms.      Pericardium/Pleural    There is no pericardial effusion.    Inferior Vena Cava    Normal inferior vena cava diameter with >50% collapse upon inspiration consistent with normal right atrial pressure, 3 mmHg.    Aorta    The aortic root at the sinus of Valsalva is normal measuring 2.73 cm with an index of 1.5. The aortic measurements are indexed to body surface area.      Mitral Valve  ----------------------------------------------------------------------  Name                                 Value         Normal  ----------------------------------------------------------------------    MV Diastolic Function  ----------------------------------------------------------------------  MV E Peak Velocity               80.0 cm/s                 MV A Peak Velocity               35.9 cm/s                 MV E/A                                 2.2       0.8-2.0   MV Decel Time PW                    189 ms                   MV Annular TDI  ----------------------------------------------------------------------  MV Septal e' Velocity             8.6 cm/s         >=7.0   MV E/e' (Septal)                       9.3                 MV Lateral e' Velocity           18.5 cm/s        >=10.0   MV E/e' (Lateral)  4.3                 MV e' Average                    13.5 cm/s                 MV E/e' (Average)                      6.8        <=14.0    Tricuspid Valve  ----------------------------------------------------------------------  Name                                 Value        Normal  ----------------------------------------------------------------------    Estimated PAP/RSVP  ----------------------------------------------------------------------  RA Pressure                         3 mmHg            <8    Aorta  ----------------------------------------------------------------------  Name                                 Value        Normal  ----------------------------------------------------------------------    Ascending Aorta  ----------------------------------------------------------------------  Sinus of Valsalva Diameter          2.7 cm    Ventricles  ----------------------------------------------------------------------  Name                                 Value        Normal  ----------------------------------------------------------------------    LV Dimensions 2D/MM  ----------------------------------------------------------------------   IVS Diastolic Thickness  (2D)                                 1.2 cm       0.5-0.9   LVID Diastole (2D)                  3.4 cm       3.8-5.2    LVPW Diastolic Thickness  (2D)                                1.1 cm       0.5-0.9   LVID Systole (2D)                   2.3 cm       2.2-3.5   LVID Diastolic Index (2D)        1.9 cm/m2       2.3-3.1     LV Fractional Shortening/Ejection Fraction 2D/MM  ----------------------------------------------------------------------  LV EF (2D Teichholz)                  61 %                  LV Fractional Shortening  (2D)                                31.5 %     27.0-45.0  LV SV (Cubed)                      25.8 ml                 LV SV (Teich)                      28.0 ml                 LV EDV (Cubed)                     37.9 ml                 LV ESV (Cubed)                     12.2 ml                 LV EDV (Teich)                     46.1 ml                 LV ESV (Teich)                     18.1 ml                   RV Dimensions 2D/MM  ----------------------------------------------------------------------  TAPSE                               1.0 cm         >=1.7    Atria  ----------------------------------------------------------------------  Name                                 Value        Normal  ----------------------------------------------------------------------    LA Dimensions  ----------------------------------------------------------------------  LA Dimension (2D)                   2.8 cm       2.7-3.8   LA Dimen Index (2D)              1.6 cm/m2                 LA Volume (BP A-L)                 90.9 ml                 LA Volume Index (BP A-L)        50.8 ml/m2         <35.0   LA Volume Index (BP MOD)        48.2 ml/m2        <=34.0     RA Dimensions  ----------------------------------------------------------------------   RA Diastolic Major Axis  Length (4C)                         4.8 cm                  RA Diastolic Major Axis  Length Index (4C)                      0.3  RA Area (4C)                       11.6 cm2         <21.0   RA ESV Index (4C MOD)           13.7 ml/m2     15.0-27.0      QLAB  ----------------------------------------------------------------------  Name                                 Value        Normal  ----------------------------------------------------------------------    HM  ----------------------------------------------------------------------  LV EDV HM                         86.00 ml                 LV ESV HM                         37.00 ml                 LV EF HM                              57 %                 LV Systolic Length HM              6.50 cm                 LV Diastolic Length HM             7.80 cm                 LV Stroke Volume HM               49.00 ml                 LV CI HM                        2.24 L/min/m2                 LV Mass HM                         96.00 g                  LA End Diastolic Volume  (3D)                              31.00 ml                 LA End Systolic Volume (3D)        4.00 ml                 LA Volume Index (3D)            17.00 ml/m2                 LA EF (3D)  86 %      Wall Motion Scoring  Wall Motion Scoring Index:     1.00      Technical Quality:     Adequate image quality    Procedure(s)    Complete transthoracic echocardiogram performed with 2D imaging, color Doppler, and spectral Doppler.    Staff  Referring Provider:     Norleen CHRISTELLA Harvey MD  Ordering Provider:     Norleen CHRISTELLA Harvey MD  Sonographer:     [place] LOISE Holmes RDCS    [phone]      Report Signatures  Finalized by Comer KANDICE Barren  MD on 11/05/2023 12:05 PM   Results for orders placed or performed during the hospital encounter of 05/12/23   ECHO LIMITED STUDY (W/COLOR FLOW)   Result Value Ref Range    EF Echo 57     Narrative    CONCLUSIONS    * This is a limited study for ventricular function.  The patient has a heart transplant.    * Left ventricular systolic function is normal with an ejection fraction of 57 % by Hendler's biplane.     * Left ventricular chamber size is normal.    * Right ventricular wall motion, systolic function, and chamber size are normal by visual assessment.    * There is no evidence of pulmonary hypertension.  The estimated pulmonary arterial systolic pressure is 19 mmHg.    * There is no pericardial effusion.    Comparison    * No significant change since prior study from 11/06/2022.      Patient Info  Name:     Cathy [name]  Age:     66 years  DOB:     29-Aug-1950  Gender:     Female  MRN:     [id number]  Ht:     69 in  Wt:     150 lb  BSA:     1.82 m2  BP:     139  /     62 mmHg  Exam Date:     05/12/2023 11:19 AM  Patient Status:     OP    Exam Type:     ECHO LIMITED STUDY (W/COLOR FLOW)    Indications      Cardiac transplant rejection (HCC) -      Left Ventricle    Left ventricular systolic function is normal with an ejection fraction of 57 % by Manring's biplane. Left ventricular chamber size is normal.    Right Ventricle    Right ventricular wall motion, systolic function, and chamber size are normal by visual assessment.      Left Atrium    Left atrium is not assessed.    Right Atrium    Right atrium is not assessed.      Aortic Valve    Aortic valve is not assessed.    Pulmonic Valve    Pulmonic valve is not assessed.    Mitral Valve    Mitral valve is not assessed.    Tricuspid Valve    Tricuspid valve is not completely assessed. There is mild tricuspid valve regurgitation. There is no evidence of pulmonary hypertension.  The estimated pulmonary arterial systolic pressure is 19 mmHg.      Pericardium/Pleural    There is no pericardial effusion.    Inferior Vena Cava    Normal inferior vena cava diameter with >50% collapse upon inspiration consistent with normal right atrial pressure, 3 mmHg.  Aorta    Aorta is not assessed.      Tricuspid Valve  ----------------------------------------------------------------------  Name                                 Value         Normal  ----------------------------------------------------------------------    TV Regurgitation Doppler  ----------------------------------------------------------------------  TR Peak Velocity                199.4 cm/s       <=280.0   TR Peak Gradient                 15.9 mmHg                   Estimated PAP/RSVP  ----------------------------------------------------------------------  RA Pressure                         3 mmHg            <8   PA Systolic Pressure               19 mmHg           <35   RV Systolic Pressure               19 mmHg           <36    Ventricles  ----------------------------------------------------------------------  Name                                 Value        Normal  ----------------------------------------------------------------------    LV Fractional Shortening/Ejection Fraction 2D/MM  ----------------------------------------------------------------------  LV EF (BP MOD)                        57 %         54-74    LV Diastolic Volume Index  (BP MOD)                        37.8 ml/m2     29.0-61.0   LV SV (2C MOD)                     47.4 ml                 LV SV (BP MOD)                     39.6 ml                   RV Dimensions 2D/MM  ----------------------------------------------------------------------  TAPSE                               1.5 cm         >=1.7      Wall Motion Scoring  Wall Motion Scoring Index:     1.00      Technical Quality:     Adequate image quality    Limited transthoracic echocardiogram is performed with: 2D imaging, color Doppler and spectral Doppler.    Staff  Ordering Provider:     Caleen Found MD  Sonographer:     Morton GORMAN Rued    [phone]  Report Signatures  Finalized by Caleen Found  MD on 05/12/2023 12:55 PM   Results for orders placed or performed during the hospital encounter of 11/05/18   TRANSESOPHAGEAL ECHO (TEE)    Narrative                Transesophageal Echo Report     Name: Cathy [name], Cathy [name] Exam Date: 11/09/2018 15:01  Ordering Physician: LADONNA DALLAS JINNY winnie)   Age 76 Gender: F Location: [facility] Referring Physician: LADONNA DALLAS   :   MRN: [id number] Ht (in): 70 Wt (lb): 244 Performing Physician: LADONNA DALLAS   Procedure CPT: Technologist: Morene Etienne   Indications: POST VAD PROTOCOL   ICD-9 Codes:         CONCLUSIONS   MODERATELY INCREASED RIGHT VENTRICULAR SIZE. MODERATELY DECREASED RIGHT VENTRICULAR SYSTOLIC   FUNCTION.   RV FUNCTION REMAINED THE SAME ON 4.5 LPM AND 2.0 LPM ECMO FLOWS.   STRUCTURALLY NORMAL TRILEAFLET AORTIC VALVE. NO AORTIC VALVE STENOSIS. NO AORTIC VALVE   REGURGITATION. AV WITH GRATER DURATION OF OPENING WITH LOWER FLOW OF ECMO AT 2L/MIN (DOWN FROM   4.5 LPM).     PT WAS LEFT ON 3.0 LPM.  IMAGING AT THE END OF THE ECMO RAMP STUDY SHOWED STABLE FUNCTION.   BP: 117 / 47 HR: 110 Rhythm: Sinus   Technical Quality: Adequate       Description: Patient was brought to the holding area in a fasting state. Both benefits and risks of the procedure were   explained in detail to the patient. Patient understands the risks of major complications including death,   sustained ventricular tachycardia, and severe angina estimated at less than 1 in 5000. There is 1 in 10,000   risk of perforation. Methemoglobiemia is very rare.   Medications Patient was intubated.     Complications Patient tolerated the procedure well, there were no complications.   Proc. Components 2D Echo, Color Flow Doppler, PW/CW Doppler.   Ease of Transducer Insertion of the probe was successful.   Insertion:   FINDINGS   Left Ventricle Decreased left ventricular cavity size. Moderately decreased left ventricular systolic function.   Right Ventricle Moderately increased right ventricular size. Moderately decreased right ventricular systolic function.   Right Atrium Right atrium not well visualized.   Left Atrium Left atrium not well visualized.   LA Appendage Not visualized.   IA Septum Interatrial septum not assessed on this study.   Mitral  Valve Mitral valve not assessed on this study.   Aortic Valve Structurally normal trileaflet aortic valve. No aortic valve stenosis. No aortic valve regurgitation. AV with   grater duration of opening with lower flow of ECMO at 2L/min (down from 4.5 LPM).   Tricuspid Valve Structurally normal tricuspid valve. Trace tricuspid valve regurgitation. No tricuspid valve stenosis.   Pulmonic Valve Structurally normal pulmonic valve. Trace pulmonary valve regurgitation.   Pericardium Pericardium not assessed on this study.   Aorta Aorta not assessed on this study.         Dr. Dallas Ladonna MD   (Electronically Signed)   Final Date: 09 November 2018 18:51       Stress Test:  No results found for this or any previous visit.    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    The patient was taken to the Cardiac Catheterization Laboratory   in the post-absorptive, non-sedated state after informed consent   was obtained.     Moderate Sedation  Starting time: 0944   Ending time: 1037     Sedating agents were administered under my supervision.  An independent trained observer was present during the procedure   for monitoring of moderate sedation with hemodynamic, continuous   telemetry and oxygen assessment as per case log.    Midazolam 1 mg  Fentanyl 50 mcg    ENDOMYOCARDIAL BIOPSY    PROCEDURES PERFORMED:  Endomyocardial biopsy  Ultrasound guidance    PROCEDURAL SUMMARY:   The right neck was prepped and draped in a sterile manner. Local    anesthesia was achieved with 1% lidocaine.     Right internal jugular vein patency was confirmed with ultrasound   with permanent recording and reporting. Concurrent realtime   ultrasound visualization of vascular needle entry was performed.   Using the Seldinger technique, a #7-French introducer sheath was   placed in the right internal jugular vein. T  This catheter was removed and  a 43 cm Cordis disposable guiding   sheath was advanced over the wire to the right ventricle near the   interventricular septum.     Under fluoroscopic guidance, using a disposable bioptome, 5   endomyocardial biopsy specimens were taken from the right   ventricle. This was replaced with a 7Fr Cordis sheath       RESULTS:  RA 3 mmHg.  RV 43/3 mmHg with an end-diastolic pressure of 16 mmHg  RV sat 74 Ao 98   CO 7.2/ CI 4     CORONARY ANGIOGRAPHY PROCEDURE    PROCEDURES:                                   Left heart catheterization  Coronary angiography  Intravascular Coronary Ultrasound     EQUIPMENT:  6 French 11 cm Right radial artery sheath  6 French EBU 3.0   5Fr 3DRC         PROCEDURAL NOTE:       Cathy [name] is a 73 y.o. female with Orthotopic heart   transplant      Risks, benefits, and alternatives to the procedure were discussed   with the patient, who verbalized understanding and consented to   proceed. Time out was performed in the cath lab prior to the   procedure. Right radial Allen's normal by plethysmography.    The Right wrist was prepped and draped in the usual sterile   fashion. A 6 French sheath was inserted to the Right radial   artery using a micropuncture kit and modified Seldinger   technique. Intra-arterial verapamil and nitroglycerine   administered to prevent arterial spasm. Intra-arterial heparin   administered to reduce risk of thrombosis. Above catheters were   used for coronary angiography. All catheters were exchanged over   a wire, advanced under fluoroscopic guidance, carefully   aspirated,  inspected for bubbles, and flushed prior to  use. The   patient tolerated the procedure well without complications.     FINDINGS:     CORONARY ANGIOGRAPHY:     1) Left main coronary artery:    Arises normally from L sinus of valsalva and divides into LAD and   circumflex branches   Smooth walled, without atherosclerotic plaque or luminal   irregularities.     2) Left anterior descending coronary artery:  Normal caliber vessel with diagonal branches and  wraps around   the apex  No plaque or luminal irregularities      3) Left circumflex coronary artery:  non dominant, 3 obtuse marginal branches  No plaque or luminal irregularities      4) Right coronary artery:  dominant, feeds a posterior descending artery and  posterolateral   branch  No plaque or luminal irregularities      5) Ramus intermediate:   NA     LEFT VENTRICLE:              LVEDP: 7   mmHg              Aortic stenosis:  None - Aortic valve was crossed   using diagnostic right catheter     IVUS    Heparin 60 units per kg given intravenously.  A Runthrough wire was placed in the distal LAD under fluoroscopic   and angiographic visualization.     A [employer] Opticross IVUS catheter was advanced in a   monorail fashion over the wire to the distal LAD and automated   pullback at 0.5 mmg/sec conducted and recorded for offline   analysis.    Following IVUS pullback, the catheter and then wire were removed   with fluoroscopic  visualization. Repeated angiography showed no   evidence of complication following removal of the wire and   device.    IVUS Interpretation:   Rare less than 0.1 mm IMT in LM and LAD rest of the LAD no IMT       HEMOSTASIS:   TR band applied     Complications:  None  Contrast: 35 cc     The patient tolerated the procedure well. The patient was   transferred to the holding area where the remaining sheath will   be removed and hemostasis will be achieved manually.    Impression:     Normal Hemodynamics / Output  Normal  Coronaries  ISHLT CAV 0     Can skip coronary angiogram next year unless clinically indicated         Derick Raman MD [facility]   Advanced Heart Failure Heart Transplant [facility] and Interventional   Cardiology  Clinic [phone]  Cell [phone]               Duwaine DELENA Dash, MD  [name]  12/18/2023  "

## 2023-12-19 LAB — HEMOGLOBIN A1C
Estimated Avg Glucose, External: 118 mg/dL (ref 91–123)
Hemoglobin A1C, External: 5.7 % — ABNORMAL HIGH (ref 4.8–5.6)

## 2023-12-24 ENCOUNTER — Encounter: Admit: 2023-12-24 | Discharge: 2023-12-24 | Payer: MEDICARE | Primary: Family Medicine

## 2023-12-24 ENCOUNTER — Inpatient Hospital Stay: Admit: 2023-12-24 | Payer: MEDICARE | Primary: Family Medicine

## 2023-12-24 DIAGNOSIS — N1831 Chronic kidney disease, stage 3a (HCC): Principal | ICD-10-CM

## 2023-12-24 LAB — CBC WITH AUTO DIFFERENTIAL
Basophils %: 0.3 % (ref 0.0–2.0)
Basophils Absolute: 0.01 K/UL (ref 0.00–0.10)
Eosinophils %: 1.2 % (ref 0.0–5.0)
Eosinophils Absolute: 0.04 K/UL (ref 0.00–0.40)
Hematocrit: 32.6 % — ABNORMAL LOW (ref 35.0–45.0)
Hemoglobin: 9.4 g/dL — ABNORMAL LOW (ref 12.0–16.0)
Immature Granulocytes %: 0 % (ref 0.0–0.5)
Immature Granulocytes Absolute: 0 K/UL (ref 0.00–0.04)
Lymphocytes %: 29.5 % (ref 21.0–52.0)
Lymphocytes Absolute: 1.02 K/UL (ref 0.90–3.60)
MCH: 26.7 pg (ref 24.0–34.0)
MCHC: 28.8 g/dL — ABNORMAL LOW (ref 31.0–37.0)
MCV: 92.6 FL (ref 78.0–100.0)
MPV: 12.3 FL — ABNORMAL HIGH (ref 9.2–11.8)
Monocytes %: 10.4 % — ABNORMAL HIGH (ref 3.0–10.0)
Monocytes Absolute: 0.36 K/UL (ref 0.05–1.20)
Neutrophils %: 58.6 % (ref 40.0–73.0)
Neutrophils Absolute: 2.03 K/UL (ref 1.80–8.00)
Nucleated RBCs: 0 /100{WBCs}
Platelets: 166 K/uL (ref 135–420)
RBC: 3.52 M/uL — ABNORMAL LOW (ref 4.20–5.30)
RDW: 14.9 % — ABNORMAL HIGH (ref 11.6–14.5)
WBC: 3.5 K/uL — ABNORMAL LOW (ref 4.6–13.2)
nRBC: 0 K/uL (ref 0.00–0.01)

## 2023-12-24 LAB — COMPREHENSIVE METABOLIC PANEL
ALT: 20 U/L (ref 10–35)
AST: 17 U/L (ref 10–38)
Albumin/Globulin Ratio: 1.4 (ref 0.8–1.7)
Albumin: 3.5 g/dL (ref 3.4–5.0)
Alk Phosphatase: 114 U/L (ref 45–117)
Anion Gap: 10 mmol/L (ref 3.0–18.0)
BUN/Creatinine Ratio: 20 (ref 12–20)
BUN: 24 mg/dL — ABNORMAL HIGH (ref 6–23)
CO2: 24 mmol/L (ref 21–32)
Calcium: 9.3 mg/dL (ref 8.6–10.2)
Chloride: 107 mmol/L (ref 98–107)
Creatinine: 1.18 mg/dL (ref 0.6–1.3)
Est, Glom Filt Rate: 49 ml/min/1.73m2 — ABNORMAL LOW (ref >60)
Globulin: 2.4 g/dL (ref 2.0–4.0)
Glucose: 104 mg/dL (ref 74–108)
Potassium: 5.9 mmol/L — ABNORMAL HIGH (ref 3.5–5.5)
Sodium: 141 mmol/L (ref 136–145)
Total Bilirubin: 0.2 mg/dL (ref 0.2–1.0)
Total Protein: 5.9 g/dL — ABNORMAL LOW (ref 6.4–8.2)

## 2023-12-24 LAB — HEMOGLOBIN A1C
Estimated Avg Glucose: 127 mg/dL
Hemoglobin A1C: 6.1 % — ABNORMAL HIGH (ref 4.2–5.6)

## 2024-01-01 ENCOUNTER — Ambulatory Visit: Admit: 2024-01-01 | Discharge: 2024-01-01 | Payer: MEDICARE | Attending: Family Medicine | Primary: Family Medicine

## 2024-01-01 VITALS — BP 117/64 | HR 80 | Temp 98.70000°F | Resp 16 | Ht 70.0 in | Wt 146.0 lb

## 2024-01-01 DIAGNOSIS — Z0000 Encounter for general adult medical examination without abnormal findings: Principal | ICD-10-CM

## 2024-01-01 DIAGNOSIS — R739 Hyperglycemia, unspecified: Secondary | ICD-10-CM

## 2024-01-01 NOTE — Progress Notes (Signed)
"  Cathy [name] presents today for   Chief Complaint   Patient presents with    Medicare AWV           Have you been to the ER, urgent care clinic since your last visit?  Hospitalized since your last visit?    No    Have you seen or consulted any other health care providers outside of [facility] since your last visit?    NO           "

## 2024-01-01 NOTE — Progress Notes (Addendum)
 "[facility]:  01/08/2024, MRN: [id number]      Cathy [name] is a 73 y.o. female and presents to clinic for Medicare AWV      Subjective:   The patient is a 73 year old female with history of hypertension, CHF, mitral regurgitation, prediabetes, cholelithiasis, asthma, multinodular goiter, obstructive sleep apnea (not on rx), allergic rhinitis, osteoporosis, uterine fibroids per EHR, insomnia, cataracts, dysphagia from achalasia?, LVAD placement in 2019, AICD placement, S/p heart transplant (2020, followed by [facility]), seizure, left distal radial artery occlusion (2020), status post removal of LLE schwannoma by Dr.Balsalmo, left Baker's cyst, Covid-19 (2021), CKD stage 3, HLD, and meningioma status post resection.      1. CKD Stage 3, Anemia, and Hyperkalemia: No recent heart sensations.  Since her last visit, she reports: her results are back to her baseline.  Her Jardiance  was discontinued. Her tacrolimus was very high last month. The dose was not changed. Blood pressure today is 117/64.  Heart rate is 80.    December 28, 2023 labs show a normal potassium of 5.4.  Her estimated GFR is 50.7.  Her creatinine is down to 1.1.  Her anion gap is normal.  Her tacrolimus level last month was elevated at 14.9 but follow-up lab work on November 3 showed a normal level of 2.8.  Her vitamin D level was normal at 32.3 per October labs. No bleeding hx. No SOB. No CP or palpitations.  Her next apt with her transplant team: December. Taking lasix 20mg  daily prn.      Latest Reference Range & Units 06/24/23 13:46 12/24/23 09:27   Potassium 3.5 - 5.5 mmol/L 5.5 5.9 (H)   (H): Data is abnormally high     Latest Reference Range & Units 06/24/23 13:46 12/24/23 09:27   Bun/Cre 12 - 20   22 (H) 20   Anion Gap 3.0 - 18.0 mmol/L 10 10   Est, Glom Filt Rate >60 ml/min/1.47m2 46 (L) 49 (L)   (H): Data is abnormally high  (L): Data is abnormally low     Latest Reference Range & Units 06/24/23 13:46 12/24/23 09:27    Hemoglobin Quant 12.0 - 16.0 g/dL 89.7 (L) 9.4 (L)   Hematocrit 35.0 - 45.0 % 34.9 (L) 32.6 (L)   MCV 78.0 - 100.0 FL 98.0 92.6   (L): Data is abnormally low    2. Prediabetes: Improved.      Latest Reference Range & Units 06/24/23 13:46 12/24/23 09:27   Hemoglobin A1C 4.2 - 5.6 % 6.3 (H) 6.1 (H)   (H): Data is abnormally high        Patient Active Problem List    Diagnosis Date Noted    Heart transplant status (HCC) 05/14/2021    Stage 3a chronic kidney disease (HCC) 08/26/2021    Abnormal MRI 04/30/2020    Mild persistent asthma without complication 08/30/2019    History of seizures 08/30/2019    Thyroid nodule 08/30/2019    Anemia 08/30/2019    Esophageal dysphagia 03/17/2017    Multinodular goiter 03/04/2016    History of meningioma 03/04/2016    Age-related osteoporosis without current pathological fracture 03/04/2016    Mixed hyperlipidemia     Non-rheumatic mitral regurgitation 04/25/2014    AR (allergic rhinitis) 09/29/2011    AICD (automatic cardioverter/defibrillator) present 11/15/2010    Cataract 12/01/2008    Gallstones 11/13/2008    OSA (obstructive sleep apnea) 11/13/2008    Fibroid 06/23/2008    HTN (hypertension) 06/23/2008  Current Outpatient Medications   Medication Sig Dispense Refill    fluticasone  (FLONASE ) 50 MCG/ACT nasal spray 1 spray by Each Nostril route daily 32 g 5    loratadine  (CLARITIN ) 10 MG tablet Take 1 tablet by mouth daily 30 tablet 3    tiZANidine  (ZANAFLEX ) 2 MG tablet Take 1 tablet by mouth every 8 hours as needed (right neck pain) 45 tablet 3    Calcium Carb-Cholecalciferol 500-5 MG-MCG TABS Take 1 tablet by mouth Daily      ascorbic acid (VITAMIN C) 1000 MG tablet Take 1 tablet by mouth daily      ROCKLATAN 0.02-0.005 % ophthalmic solution Place 1 drop into both eyes daily      atorvastatin (LIPITOR) 10 MG tablet daily      dorzolamide (TRUSOPT) 2 % ophthalmic solution ceived the following from [facility]: Outside name: dorzolamide (TRUSOPT) 2 %  ophthalmic solution      furosemide (LASIX) 20 MG tablet Take 1 tablet by mouth daily as needed      mycophenolate (CELLCEPT) 500 MG tablet Take 0.5 tablets by mouth 2 times daily      omeprazole (PRILOSEC) 20 MG delayed release capsule ceived the following from [facility]: Outside name: omeprazole (PRILOSEC) 20 mg capsule      prednisoLONE acetate (PRED FORTE) 1 % ophthalmic suspension ceived the following from [facility]: Outside name: prednisoLONE acetate (PRED FORTE) 1 % ophthalmic suspension      tacrolimus (PROGRAF) 1 MG capsule Take 5 capsules by mouth 2 times daily      brimonidine (ALPHAGAN) 0.2 % ophthalmic solution ceived the following from [facility]: Outside name: brimonidine (ALPHAGAN) 0.2 % ophthalmic solution       No current facility-administered medications for this visit.       Allergies   Allergen Reactions    Penicillamine Itching    Penicillins Itching and Rash     Itching bumps on hands and arms  Itching bumps on hands and arms  Itching bumps on hands and arms  Has patient had a PCN reaction causing immediate rash, facial/tongue/throat swelling, SOB or lightheadedness with hypotension: Yes  Has patient had a PCN reaction causing severe rash involving mucus membranes or skin necrosis: No  Has patient had a PCN reaction that required hospitalization unknown  Has patient had a PCN reaction occurring within the last 10 years: No  If all of the above answers are NO, then may proceed with Cephalosporin use.    Itching bumps on hands and arms    Tramadol Other (See Comments)     Psychotic Hallucinations  Psychotic Hallucinations  hallucinations  Psychotic Hallucinations    Chlorhexidine Itching     CHG Wipes    Copper Itching    Nsaids Other (See Comments)       Past Medical History:   Diagnosis Date    Age related osteoporosis     Automatic implantable cardiac defibrillator in situ     Post ddd icd Set up carelink    Baker cyst, left 12/2016     Breast mass, left     benign    CHF (congestive heart failure) (HCC) 06/23/2008    Non-ischemic CMP , Cath (2008) Normal coronary arteries    Chronic systolic heart failure (HCC)     Stable,     Degenerative arthritis of left knee     Fibroid 06/23/2008    Gastric intestinal metaplasia  History of brain tumor 2013    HLD (hyperlipidemia)     HTN (hypertension) 06/23/2008    Hypotension, unspecified     Related to lisinopril    Hypothyroid 06/23/2008    radioactive iodine ablation of thyroid    Left knee pain     With swelling    Leg pain     LVAD (left ventricular assist device) present (HCC) 03/2017    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     OSA (obstructive sleep apnea) 10/2008    PUD (peptic ulcer disease) 2018    small superficial gastric ulcer    Venous reflux     Wears glasses        Past Surgical History:   Procedure Laterality Date    BREAST BIOPSY      rt breast, benign    CARDIAC DEFIBRILLATOR PLACEMENT      CHOLECYSTECTOMY      CRANIOTOMY  2013    meningioma     CT GUIDED CHEST TUBE  01/13/2019    CT GUIDED CHEST TUBE 01/13/2019    CT NEEDLE BIOPSY LUNG PERCUTANEOUS W IMAGING GUIDANCE  03/21/2019    CT NEEDLE BIOPSY LUNG PERCUTANEOUS 03/21/2019    ESOPHAGEAL MOTILITY STUDY  02/16/2017         HEENT      glasses    HYSTERECTOMY (CERVIX STATUS UNKNOWN)      HYSTERECTOMY, TOTAL ABDOMINAL (CERVIX REMOVED)  1996    PACEMAKER  2013    dual chamber icd-removed 2020    PR UNLISTED PROCEDURE CARDIAC SURGERY  11/05/2018    heart transplant    TONSILLECTOMY AND ADENOIDECTOMY      TUBAL LIGATION      XR MIDLINE EQUAL OR GREATER THAN 5 YEARS  02/09/2019    XR MIDLINE EQUAL OR GREATER THAN 5 YEARS 02/09/2019    XR MIDLINE EQUAL OR GREATER THAN 5 YEARS  04/16/2017    XR MIDLINE EQUAL OR GREATER THAN 5 YEARS 04/16/2017       Family History   Problem Relation Age of Onset    Diabetes Mother     Heart Attack Father         MI    Diabetes Daughter     Other Daughter         leukemia    Heart Disease  Maternal Grandmother     Heart Disease Paternal Grandmother     Kidney Disease Other         1 sib deceased    Cancer Other         1 sib throat cancer    Diabetes Other     Hypertension Other     Stomach Cancer Maternal Uncle     Stomach Cancer Maternal Uncle        Social History     Tobacco Use    Smoking status: Never    Smokeless tobacco: Never   Substance Use Topics    Alcohol use: Not Currently       ROS   Review of Systems   Constitutional:  Negative for fever.   HENT:  Negative for ear pain and sore throat.    Eyes:  Negative for pain.   Respiratory:  Negative for cough and shortness of breath.    Cardiovascular:  Negative for chest pain.   Gastrointestinal:  Negative for abdominal pain, anal bleeding and blood in stool.   Genitourinary:  Negative for dysuria and  hematuria.   Musculoskeletal:  Negative for arthralgias and myalgias.   Skin:  Negative for rash.   Neurological:  Negative for headaches.   Hematological:  Does not bruise/bleed easily.   Psychiatric/Behavioral:  Negative for suicidal ideas.          Objective     BP 117/64 (BP Site: Right Upper Arm, Patient Position: Sitting, BP Cuff Size: Medium Adult)   Pulse 80   Temp 98.7 F (37.1 C) (Temporal)   Resp 16   Ht 1.778 m (5' 10)   Wt 66.2 kg (146 lb)   SpO2 100%   BMI 20.95 kg/m      Physical Exam  Constitutional:       Appearance: Normal appearance.   HENT:      Head: Normocephalic and atraumatic.      Right Ear: External ear normal.      Left Ear: External ear normal.   Eyes:      General: No scleral icterus.        Right eye: No discharge.         Left eye: No discharge.      Conjunctiva/sclera: Conjunctivae normal.   Cardiovascular:      Rate and Rhythm: Normal rate and regular rhythm.      Pulses: Normal pulses.      Heart sounds: Murmur heard.   Pulmonary:      Effort: Pulmonary effort is normal.      Breath sounds: Normal breath sounds.   Abdominal:      General: Abdomen is flat. Bowel sounds are normal. There is no distension.       Palpations: Abdomen is soft.      Tenderness: There is no abdominal tenderness.   Musculoskeletal:         General: No swelling (BUE) or tenderness (BUE).      Cervical back: Neck supple.   Lymphadenopathy:      Cervical: No cervical adenopathy.   Skin:     General: Skin is warm and dry.      Findings: No erythema.   Neurological:      Mental Status: She is alert. Mental status is at baseline.      Gait: Gait normal.   Psychiatric:         Mood and Affect: Mood normal.           LABS   Data Review:   Lab Results   Component Value Date/Time    WBC 3.5 12/24/2023 09:27 AM    HGBPOC 11.6 01/28/2018 02:24 PM    HGB 9.4 12/24/2023 09:27 AM    HCTPOC 34 01/28/2018 02:24 PM    HCT 32.6 12/24/2023 09:27 AM    PLT 166 12/24/2023 09:27 AM    MCV 92.6 12/24/2023 09:27 AM       Lab Results   Component Value Date/Time    NA 141 12/24/2023 09:27 AM    K 5.9 12/24/2023 09:27 AM    CL 107 12/24/2023 09:27 AM    CO2 24 12/24/2023 09:27 AM    BUN 24 12/24/2023 09:27 AM    GFRAA 26 08/03/2020 01:00 PM       Lab Results   Component Value Date/Time    CHOL 163 12/16/2022 09:27 AM    HDL 81 12/16/2022 09:27 AM    LDL 65.8 12/16/2022 09:27 AM    LDL 86 08/30/2019 01:00 PM    VLDL 16.2 12/16/2022 09:27 AM  No results found for: HBA1C, HBA1CPOC    Assessment/Plan:   1. Stage 3a chronic kidney disease: Her eGFR is in the 40s per our labs. Although our labs show that her potassium is elevated, since then, she had follow-up labs that were reassuring and showed a normal potassium.  -She will continue to see her transplant team for routine checkups.  - Checking a follow-up lab panel in 6 months.    2. Chronic systolic (congestive) heart failure: Stable.  No evidence of decompensated heart failure per exam. +Heart transplant recipient - stable.  -Checking follow-up lab work in 6 months.  - Continue with lasix 20mg  prn daily  - She will f/u with her transplant team for routine checkups    3. Hyperglycemia: Her A1C is 6.1  - Checking  f/u labs in 6 months.  - Low carb diet encouraged.    4. Abnormal CBC/Anemia: Stable.  - Checking f/u labs in 6 months          ICD-10-CM    1. Hyperglycemia  R73.9 Hemoglobin A1C     Comprehensive Metabolic Panel      2. Medicare annual wellness visit, subsequent  Z00.00       3. Stage 3a chronic kidney disease (HCC)  N18.31 Hemoglobin A1C     CBC with Auto Differential     Comprehensive Metabolic Panel     Lipid Panel     Albumin/Creatinine Ratio, Urine      4. Chronic systolic (congestive) heart failure (HCC)  I50.22 Hemoglobin A1C     CBC with Auto Differential     Comprehensive Metabolic Panel     Lipid Panel     TSH + Free T4 Panel      5. Heart transplant status (HCC)  Z94.1       6. Abnormal CBC  R79.89 CBC with Auto Differential     Comprehensive Metabolic Panel      7. Encounter for screening mammogram for breast cancer  Z12.31 MAM DIGITAL SCREEN W OR WO CAD BILATERAL      8. Advance care planning  Z71.89             Health Maintenance Due   Topic Date Due    DTaP/Tdap/Td vaccine (1 - Tdap) Never done    Shingles vaccine (1 of 2) Never done    Respiratory Syncytial Virus (RSV) Pregnant or age 39 yrs+ (1 - Risk 60-74 years 1-dose series) Never done    COVID-19 Vaccine (3 - Pfizer risk series) 06/09/2019    Flu vaccine (1) 09/25/2023    Lipids  12/16/2023         Lab review: labs are reviewed in the EHRs and ordered as mentioned above    I have discussed the diagnosis with the patient and the intended plan as seen in the above orders.  The patient has received an after-visit summary and questions were answered concerning future plans.  I have discussed medication side effects and warnings with the patient as well. I have reviewed the plan of care with the patient, accepted their input and they are in agreement with the treatment goals. All questions were answered. The patient understands the plan of care. Handouts provided today with above information. Pt instructed if symptoms worsen to call the office or  report to the ED for continued care.  Greater than 50% of the visit time was spent in counseling and/or coordination of care.      Voice recognition was used to  generate this report, which may have resulted in some phonetic based errors in grammar and contents. Even though attempts were made to correct all the mistakes, some may have been missed, and remained in the body of the document.      No follow-up provider specified.    Harlene Arlean Rocks, MD        Medicare Annual Wellness Visit    Cathy [name] is here for Medicare AWV    Assessment & Plan   Hyperglycemia  -     Hemoglobin A1C; Future  -     Comprehensive Metabolic Panel; Future  Medicare annual wellness visit, subsequent  Stage 3a chronic kidney disease (HCC)  -     Hemoglobin A1C; Future  -     CBC with Auto Differential; Future  -     Comprehensive Metabolic Panel; Future  -     Lipid Panel; Future  -     Albumin/Creatinine Ratio, Urine; Future  Chronic systolic (congestive) heart failure (HCC)  -     Hemoglobin A1C; Future  -     CBC with Auto Differential; Future  -     Comprehensive Metabolic Panel; Future  -     Lipid Panel; Future  -     TSH + Free T4 Panel; Future  Heart transplant status (HCC)  Abnormal CBC  -     CBC with Auto Differential; Future  -     Comprehensive Metabolic Panel; Future  Encounter for screening mammogram for breast cancer  -     MAM DIGITAL SCREEN W OR WO CAD BILATERAL; Future  Advance care planning     Return in about 5 months (around 05/31/2024).     Subjective     Patient's complete Health Risk Assessment and screening values have been reviewed and are found in Flowsheets. The following problems were reviewed today and where indicated follow up appointments were made and/or referrals ordered.    Health maintenance:  - Overdue for multiple vaccinations.  I encouraged her to get all recommended vaccinations. She believes she had her flu shot but we don't have records (in August or September).   - No bleeding.  - No drug  use.  - Ordering a mammogram.  - No changes to her advance directive.    Positive Risk Factor Screenings with Interventions:              Inactivity:  On average, how many days per week do you engage in moderate to strenuous exercise (like a brisk walk)?: 1 day (!) Abnormal  On average, how many minutes do you engage in exercise at this level?: 20 min  Interventions:  See AVS for additional education material      Dentist Screen:  Have you seen the dentist within the past year?: (!) No  Intervention:  See AVS for additional education material                     Objective   Vitals:    01/01/24 1005   BP: 117/64   BP Site: Right Upper Arm   Patient Position: Sitting   BP Cuff Size: Medium Adult   Pulse: 80   Resp: 16   Temp: 98.7 F (37.1 C)   TempSrc: Temporal   SpO2: 100%   Weight: 66.2 kg (146 lb)   Height: 1.778 m (5' 10)      Body mass index is 20.95 kg/m.  Allergies   Allergen Reactions    Penicillamine Itching    Penicillins Itching and Rash     Itching bumps on hands and arms  Itching bumps on hands and arms  Itching bumps on hands and arms  Has patient had a PCN reaction causing immediate rash, facial/tongue/throat swelling, SOB or lightheadedness with hypotension: Yes  Has patient had a PCN reaction causing severe rash involving mucus membranes or skin necrosis: No  Has patient had a PCN reaction that required hospitalization unknown  Has patient had a PCN reaction occurring within the last 10 years: No  If all of the above answers are NO, then may proceed with Cephalosporin use.    Itching bumps on hands and arms    Tramadol Other (See Comments)     Psychotic Hallucinations  Psychotic Hallucinations  hallucinations  Psychotic Hallucinations    Chlorhexidine Itching     CHG Wipes    Copper Itching    Nsaids Other (See Comments)     Prior to Visit Medications   Medication Sig Taking? Authorizing Provider   fluticasone  (FLONASE ) 50 MCG/ACT nasal spray 1 spray by Each Nostril route daily Yes  Jonette, Harlene Dragon, MD   loratadine  (CLARITIN ) 10 MG tablet Take 1 tablet by mouth daily Yes Jonette, Harlene Dragon, MD   tiZANidine  (ZANAFLEX ) 2 MG tablet Take 1 tablet by mouth every 8 hours as needed (right neck pain) Yes Jonette Harlene Dragon, MD   Calcium Carb-Cholecalciferol 500-5 MG-MCG TABS Take 1 tablet by mouth Daily Yes [provider]   ascorbic acid (VITAMIN C) 1000 MG tablet Take 1 tablet by mouth daily Yes [provider]   ROCKLATAN 0.02-0.005 % ophthalmic solution Place 1 drop into both eyes daily Yes [provider]   atorvastatin (LIPITOR) 10 MG tablet daily Yes Automatic Reconciliation, Ar   dorzolamide (TRUSOPT) 2 % ophthalmic solution ceived the following from [facility]: Outside name: dorzolamide (TRUSOPT) 2 % ophthalmic solution Yes Automatic Reconciliation, Ar   furosemide (LASIX) 20 MG tablet Take 1 tablet by mouth daily as needed Yes Automatic Reconciliation, Ar   mycophenolate (CELLCEPT) 500 MG tablet Take 0.5 tablets by mouth 2 times daily Yes Automatic Reconciliation, Ar   omeprazole (PRILOSEC) 20 MG delayed release capsule ceived the following from [facility]: Outside name: omeprazole (PRILOSEC) 20 mg capsule Yes Automatic Reconciliation, Ar   prednisoLONE acetate (PRED FORTE) 1 % ophthalmic suspension ceived the following from [facility]: Outside name: prednisoLONE acetate (PRED FORTE) 1 % ophthalmic suspension Yes Automatic Reconciliation, Ar   tacrolimus (PROGRAF) 1 MG capsule Take 5 capsules by mouth 2 times daily Yes Automatic Reconciliation, Ar   brimonidine (ALPHAGAN) 0.2 % ophthalmic solution ceived the following from [facility]: Outside name: brimonidine (ALPHAGAN) 0.2 % ophthalmic solution  Automatic Reconciliation, Ar       CareTeam (Including outside providers/suppliers regularly involved in providing care):   Patient Care Team:  Jonette Harlene Dragon, MD as PCP - General  Jonette,  Harlene Dragon, MD as PCP - Empaneled Provider     Recommendations for Preventive Services Due: see orders and patient instructions/AVS.  Recommended screening schedule for the next 5-10 years is provided to the patient in written form: see Patient Instructions/AVS.     Reviewed and updated this visit:  Allergies  Meds  Problems               "

## 2024-01-01 NOTE — Patient Instructions (Signed)
 "     Learning About Being Active as an Older Adult  Why is being active important as you get older?     Being active is one of the best things you can do for your health. And it's never too late to start. Being active--or getting active, if you aren't already--has definite benefits. It can:  Give you more energy,  Keep your mind sharp.  Improve balance to reduce your risk of falls.  Help you manage chronic illness with fewer medicines.  No matter how old you are, how fit you are, or what health problems you have, there is a form of activity that will work for you. And the more physical activity you can do, the better your overall health will be.  What kinds of activity can help you stay healthy?  Being more active will make your daily activities easier. Physical activity includes planned exercise and things you do in daily life. There are four types of activity:  Aerobic.  Doing aerobic activity makes your heart and lungs strong.  Includes walking, dancing, and gardening.  Aim for at least 2 hours spread throughout the week.  It improves your energy and can help you sleep better.  Muscle-strengthening.  This type of activity can help maintain muscle and strengthen bones.  Includes climbing stairs, using resistance bands, and lifting or carrying heavy loads.  Aim for at least twice a week.  It can help protect the knees and other joints.  Stretching.  Stretching gives you better range of motion in joints and muscles.  Includes upper arm stretches, calf stretches, and gentle yoga.  Aim for at least twice a week, preferably after your muscles are warmed up from other activities.  It can help you function better in daily life.  Balancing.  This helps you stay coordinated and have good posture.  Includes heel-to-toe walking, tai chi, and certain types of yoga.  Aim for at least 3 days a week.  It can reduce your risk of falling.  Even if you have a hard time meeting the recommendations, it's better to be more active  than less active. All activity done in each category counts toward your weekly total. You'd be surprised how daily things like carrying groceries, keeping up with grandchildren, and taking the stairs can add up.  What keeps you from being active?  If you've had a hard time being more active, you're not alone. Maybe you remember being able to do more. Or maybe you've never thought of yourself as being active. It's frustrating when you can't do the things you want. Being more active can help. What's holding you back?  Getting started.  Have a goal, but break it into easy tasks. Small steps build into big accomplishments.  Staying motivated.  If you feel like skipping your activity, remember your goal. Maybe you want to move better and stay independent. Every activity gets you one step closer.  Not feeling your best.  Start with 5 minutes of an activity you enjoy. Prove to yourself you can do it. As you get comfortable, increase your time.  You may not be where you want to be. But you're in the process of getting there. Everyone starts somewhere.  How can you find safe ways to stay active?  Talk with your doctor about any physical challenges you're facing. Make a plan with your doctor if you have a health problem or aren't sure how to get started with activity.  If you're already  active, ask your doctor if there is anything you should change to stay safe as your body and health change.  If you tend to feel dizzy after you take medicine, avoid activity at that time. Try being active before you take your medicine. This will reduce your risk of falls.  If you plan to be active at home, make sure to clear your space before you get started. Remove things like TV cords, coffee tables, and throw rugs. It's safest to have plenty of space to move freely.  The key to getting more active is to take it slow and steady. Try to improve only a little bit at a time. Pick just one area to improve on at first. And if an activity hurts,  stop and talk to your doctor.  Where can you learn more?  Go to [employer] and enter P600 to learn more about Learning About Being Active as an Older Adult.  Current as of: September 24, 2022  Content Version: 14.6   2024-2025 [place], [place].   Care instructions adapted under license by [facility]. If you have questions about a medical condition or this instruction, always ask your healthcare professional. Romayne Alderman, [facility], disclaims any warranty or liability for your use of this information.         Learning About Dental Care for Older Adults  Dental care for older adults: Overview  Dental care for older people is much the same as for younger adults. But older adults do have concerns that younger adults do not. Older adults may have problems with gum disease and decay on the roots of their teeth. They may need missing teeth replaced or broken fillings fixed. Or they may have dentures that need to be cared for. Some older adults may have trouble holding a toothbrush.  You can help remind the person you are caring for to brush and floss their teeth or to clean their dentures. In some cases, you may need to do the brushing and other dental care tasks. People who have trouble using their hands or who have dementia may need this extra help.  How can you help with dental care?  Normal dental care  To keep the teeth and gums healthy:  Brush the teeth with fluoride toothpaste twice a day--in the morning and at night--and floss at least once a day. Plaque can quickly build up on the teeth of older adults.  Watch for the signs of gum disease. These signs include gums that bleed after brushing or after eating hard foods, such as apples.  See a dentist regularly. Many experts recommend checkups every 6 months.  Keep the dentist up to date on any new medications the person is taking.  Encourage a balanced diet that includes whole grains, vegetables, and fruits, and that is low in  saturated fat and sodium.  Encourage the person you're caring for not to use tobacco products. They can affect dental and general health.  Many older adults have a fixed income and feel that they can't afford dental care. But most towns and cities have programs in which dentists help older adults by lowering fees. Contact your area's public health offices or social services for information about dental care in your area.  Using a toothbrush  Older adults with arthritis sometimes have trouble brushing their teeth because they can't easily hold the toothbrush. Their hands and fingers may be stiff, painful, or weak. If this is the case, you can:  Offer an mining engineer toothbrush.  Enlarge the handle of a non-electric toothbrush by wrapping a sponge, an elastic bandage, or adhesive tape around it.  Push the toothbrush handle through a ball made of rubber or soft foam.  Make the handle longer and thicker by taping Popsicle sticks or tongue depressors to it.  You may also be able to buy special toothbrushes, toothpaste dispensers, and floss holders.  Your doctor may recommend a soft-bristle toothbrush if the person you care for bleeds easily. Bleeding can happen because of a health problem or from certain medicines.  A toothpaste for sensitive teeth may help if the person you care for has sensitive teeth.  How do you brush and floss someone's teeth?  If the person you are caring for has a hard time cleaning their teeth on their own, you may need to brush and floss their teeth for them. It may be easiest to have the person sit and face away from you, and to sit or stand behind them. That way you can steady their head against your arm as you reach around to floss and brush their teeth. Choose a place that has good lighting and is comfortable for both of you.  Before you begin, gather your supplies. You will need gloves, floss, a toothbrush, and a container to hold water if you are not near a sink. Wash and dry your hands well  and put on gloves. Start by flossing:  Gently work a piece of floss between each of the teeth toward the gums. A plastic flossing tool may make this easier, and they are available at most drugstores.  Curve the floss around each tooth into a U-shape and gently slide it under the gum line.  Move the floss firmly up and down several times to scrape off the plaque.  After you've finished flossing, throw away the used floss and begin brushing:  Wet the brush and apply toothpaste.  Place the brush at a 45-degree angle where the teeth meet the gums. Press firmly, and move the brush in small circles over the surface of the teeth.  Be careful not to brush too hard. Vigorous brushing can make the gums pull away from the teeth and can scratch the tooth enamel.  Brush all surfaces of the teeth, on the tongue side and on the cheek side. Pay special attention to the front teeth and all surfaces of the back teeth.  Brush chewing surfaces with short back-and-forth strokes.  After you've finished, help the person rinse the remaining toothpaste from their mouth.  Where can you learn more?  Go to [employer] and enter F944 to learn more about Learning About Dental Care for Older Adults.  Current as of: September 24, 2022  Content Version: 14.6   2024-2025 [place], [place].   Care instructions adapted under license by [facility]. If you have questions about a medical condition or this instruction, always ask your healthcare professional. Romayne Alderman, [facility], disclaims any warranty or liability for your use of this information.         A Healthy Heart: Care Instructions  Overview    Coronary artery disease, also called heart disease, occurs when a substance called plaque builds up in the vessels that supply oxygen-rich blood to your heart muscle. This can narrow the blood vessels and reduce blood flow. A heart attack happens when blood flow is completely blocked. A high-fat diet, smoking, and other  factors increase the risk of heart disease.  Your doctor has found that you have a  chance of having heart disease. A heart-healthy lifestyle can help keep your heart healthy and prevent heart disease. This lifestyle includes eating healthy, being active, staying at a weight that's healthy for you, and not smoking, vaping, or using other tobacco or nicotine products. It also includes taking medicines as directed, managing other health conditions, and trying to get a healthy amount of sleep.  Follow-up care is a key part of your treatment and safety. Be sure to make and go to all appointments, and contact your doctor if you are having problems. It's also a good idea to know your test results and keep a list of the medicines you take.  How can you care for yourself at home?  Diet  Use less salt when you cook and eat. This helps lower your blood pressure. Taste food before salting. Add only a little salt when you think you need it. With time, your taste buds will adjust to less salt.  Eat fewer snack items, fast foods, canned soups, and other high-salt, high-fat, processed foods.  Read food labels and try to avoid saturated and trans fats. They increase your risk of heart disease by raising cholesterol levels.  Limit the amount of solid fat--butter, margarine, and shortening--you eat. Use olive, peanut, or canola oil when you cook. Bake, broil, and steam foods instead of frying them.  Eat a variety of fruit and vegetables every day. Dark green, deep orange, red, or yellow fruits and vegetables are especially good for you. Examples include spinach, carrots, peaches, and berries.  Foods high in fiber can reduce your cholesterol and provide important vitamins and minerals. High-fiber foods include whole-grain cereals and breads, oatmeal, beans, brown rice, citrus fruits, and apples.  Eat lean proteins. Heart-healthy proteins include seafood, lean meats and poultry, eggs, beans, peas, nuts, seeds, and soy products.  Limit  drinks and foods with added sugar. These include candy, desserts, and soda pop.  Heart-healthy lifestyle  If your doctor recommends it, get more exercise. For many people, walking is a good choice. Or you may want to swim, bike, or do other activities. Bit by bit, increase the time you're active every day. Try for at least 30 minutes on most days of the week.  If you smoke, vape, or use other tobacco or nicotine products, try to quit. If you cant quit, cut back as much as you can. If you need help quitting, talk to your doctor about quit programs and medicines. Quitting is one of the most important things you can do to protect your heart. Also avoid secondhand smoke and the aerosol mist from vaping.  Stay at a weight that's healthy for you. Talk to your doctor if you need help losing weight.  Try to get 7 to 9 hours of sleep each night.  Limit alcohol to 2 drinks a day for men and 1 drink a day for women. Too much alcohol can cause health problems.  Manage other health problems such as diabetes, high blood pressure, and high cholesterol. If you think you may have a problem with alcohol or drug use, talk to your doctor.  Medicines  Take your medicines exactly as prescribed. Contact your doctor if you think you are having a problem with your medicine.  When should you call for help?  Call 911 if you have symptoms of a heart attack. These may include:  Chest pain or pressure, or a strange feeling in the chest.  Sweating.  Shortness of breath.  Pain, pressure, or a  strange feeling in the back, neck, jaw, or upper belly or in one or both shoulders or arms.  Lightheadedness or sudden weakness.  A fast or irregular heartbeat.  After you call 911, the operator may tell you to chew 1 adult-strength or 2 to 4 low-dose aspirin. Wait for an ambulance. Do not try to drive yourself.  Watch closely for changes in your health, and be sure to contact your doctor if you have any problems.  Where can you learn more?  Go to  [employer] and enter F075 to learn more about A Healthy Heart: Care Instructions.  Current as of: September 24, 2022  Content Version: 14.6   2024-2025 [place], [place].   Care instructions adapted under license by [facility]. If you have questions about a medical condition or this instruction, always ask your healthcare professional. Romayne Alderman, [facility], disclaims any warranty or liability for your use of this information.    Personalized Preventive Plan for Cathy [name] - 01/01/2024  Medicare offers a range of preventive health benefits. Some of the tests and screenings are paid in full while other may be subject to a deductible, co-insurance, and/or copay.  Some of these benefits include a comprehensive review of your medical history including lifestyle, illnesses that may run in your family, and various assessments and screenings as appropriate.  After reviewing your medical record and screening and assessments performed today your provider may have ordered immunizations, labs, imaging, and/or referrals for you.  A list of these orders (if applicable) as well as your Preventive Care list are included within your After Visit Summary for your review.      "

## 2024-01-08 NOTE — ACP (Advance Care Planning) (Signed)
"  Advance Care Planning     General Advance Care Planning (ACP) Conversation    Date of Conversation: 01/01/24    Conducted with: Patient with Decision Making Capacity    Healthcare Decision Maker:  Today we documented Decision Maker(s) consistent with ACP documents on file.    Content/Action Overview:  Has ACP document(s) on file - reflects the patient's care preferences    She has no changes to her preexisting advance directive.     Length of Voluntary ACP Conversation in minutes:  <16 minutes (Non-Billable)    Cathy Arlean Rocks, MD           "
